# Patient Record
Sex: Male | Born: 1950 | Race: White | Hispanic: No | Marital: Married
Health system: Northeastern US, Academic
[De-identification: ages and names within clinical notes are randomized; demographics above are authoritative.]

## PROBLEM LIST (undated history)

## (undated) DIAGNOSIS — I10 Essential (primary) hypertension: Secondary | ICD-10-CM

## (undated) DIAGNOSIS — E039 Hypothyroidism, unspecified: Secondary | ICD-10-CM

## (undated) DIAGNOSIS — R1310 Dysphagia, unspecified: Secondary | ICD-10-CM

## (undated) HISTORY — DX: Essential (primary) hypertension: I10

## (undated) HISTORY — PX: UPPER GI ENDOSCOPY: SHX6162

---

## 2002-02-16 HISTORY — PX: COLONOSCOPY: SHX174

## 2012-12-13 LAB — LAB REPORT - SCANNED: PSA: 0.9 ng/mL

## 2012-12-15 ENCOUNTER — Encounter: Payer: Self-pay | Admitting: *Deleted

## 2013-01-09 ENCOUNTER — Ambulatory Visit (INDEPENDENT_AMBULATORY_CARE_PROVIDER_SITE_OTHER): Payer: BC Managed Care – PPO | Admitting: General Surgery

## 2013-01-09 ENCOUNTER — Encounter: Payer: Self-pay | Admitting: General Surgery

## 2013-01-09 VITALS — BP 120/78 | HR 76 | Resp 12 | Ht 65.5 in | Wt 179.0 lb

## 2013-01-09 DIAGNOSIS — K429 Umbilical hernia without obstruction or gangrene: Secondary | ICD-10-CM | POA: Insufficient documentation

## 2013-01-09 DIAGNOSIS — K4090 Unilateral inguinal hernia, without obstruction or gangrene, not specified as recurrent: Secondary | ICD-10-CM

## 2013-01-09 NOTE — Progress Notes (Signed)
Patient ID: Frank Williamson, male   DOB: 1950/03/31, 62 y.o.   MRN: [id number]  Chief Complaint  Patient presents with  . Other    New Patient evaluation for hernia    HPI Frank Williamson is a 62 y.o. male who presents for an evaluation of a hernia. The patient was referred by Dr. Vonita Moss. The patient noticed the hernia approximately in March 2014. He has some soreness and some discomfort in the right inguinal area. He states the soreness and discomfort has gradually gotten worse. Bending over, lifting, as well as coughing aggravates this area. He feels like it has gotten larger in size. He does a lot of lifting at his job. He states he has an umbilical hernia that has been present since birth. The patient is scheduled for a colonoscopy next week. He has had 1 prior colonoscopy that was normal.   HPI  Past Medical History  Diagnosis Date  . Hypertension   . Hernia 2014  . Thyroid disease     hypothyroid    Past Surgical History  Procedure Laterality Date  . Colonoscopy  2004    History reviewed. No pertinent family history.  Social History History  Substance Use Topics  . Smoking status: Never Smoker   . Smokeless tobacco: Not on file  . Alcohol Use: Yes    No Known Allergies  Current Outpatient Prescriptions  Medication Sig Dispense Refill  . amLODipine (NORVASC) 5 MG tablet Take 5 mg by mouth daily.      Marland Kitchen aspirin 81 MG tablet Take 81 mg by mouth daily.      . benazepril (LOTENSIN) 40 MG tablet Take 40 mg by mouth daily.      Marland Kitchen levothyroxine (SYNTHROID, LEVOTHROID) 150 MCG tablet Take 150 mcg by mouth daily before breakfast.      . Multiple Vitamin (MULTIVITAMIN) tablet Take 1 tablet by mouth daily.      . Saw Palmetto 450 MG CAPS Take 2 capsules by mouth daily.       No current facility-administered medications for this visit.    Review of Systems Review of Systems  Constitutional: Negative.   Respiratory: Negative.   Cardiovascular: Negative.      Blood pressure 120/78, pulse 76, resp. rate 12, height 5' 5.5" (1.664 m), weight 179 lb (81.194 kg).  Physical Exam Physical Exam  Constitutional: He is oriented to person, place, and time. He appears well-developed and well-nourished.  Eyes: Conjunctivae are normal. No scleral icterus.  Neck: Neck supple. No thyromegaly present.  Cardiovascular: Normal rate, regular rhythm and normal heart sounds.   Pulmonary/Chest: Effort normal and breath sounds normal.  Abdominal: Soft. Bowel sounds are normal. There is tenderness in the right lower quadrant. A hernia is present. Hernia confirmed positive in the right inguinal area.  Umbilical hernia is present.  1 cm fascial defect.   Right inguinal hernia. Small-medium size. Reducible.    Lymphadenopathy:    He has no cervical adenopathy.  Neurological: He is alert and oriented to person, place, and time.  Skin: Skin is warm and dry.    Data Reviewed  Dr. Christell Faith notes.   Assessment    Right inguinal hernia and umbilical hernia present.     Plan    Patient to to be scheduled for right inguinal and umbilical hernia repair. Feel this can be done via laparoscope. Procedure, risks and benfits explained to him and he is agreeable.    This patient's surgery has been scheduled for  01-26-13 at [facility]. It is okay for patient to continue 81 mg aspirin.   SANKAR,SEEPLAPUTHUR G 01/09/2013, 3:45 PM

## 2013-01-09 NOTE — Patient Instructions (Addendum)
Patient to be scheduled for umbilical and right inguinal hernia repair.   Hernia Repair with Laparoscope A hernia occurs when an internal organ pushes out through a weak spot in the belly (abdominal) wall muscles. Hernias most commonly occur in the groin and around the navel. Hernias can also occur through a cut by the surgeon (incision) after an abdominal operation. A hernia may be caused by:  Lifting heavy objects.  Prolonged coughing.  Straining to move your bowels. Hernias can often be pushed back into place (reduced). Most hernias tend to get worse over time. Problems occur when abdominal contents get stuck in the opening and the blood supply is blocked or impaired (incarcerated hernia). Because of these risks, you require surgery to repair the hernia. Your hernia will be repaired using a laparoscope. Laparoscopic surgery is a type of minimally invasive surgery. It does not involve making a typical surgical cut (incision) in the skin. A laparoscope is a telescope-like rod and lens system. It is usually connected to a video camera and a light source so your caregiver can clearly see the operative area. The instruments are inserted through  to  inch (5 mm or 10 mm) openings in the skin at specific locations. A working and viewing space is created by blowing a small amount of carbon dioxide gas into the abdominal cavity. The abdomen is essentially blown up like a balloon (insufflated). This elevates the abdominal wall above the internal organs like a dome. The carbon dioxide gas is common to the human body and can be absorbed by tissue and removed by the respiratory system. Once the repair is completed, the small incisions will be closed with either stitches (sutures) or staples (just like a paper stapler only this staple holds the skin together). LET YOUR CAREGIVERS KNOW ABOUT:  Allergies.  Medications taken including herbs, eye drops, over the counter medications, and creams.  Use of steroids  (by mouth or creams).  Previous problems with anesthetics or Novocaine.  Possibility of pregnancy, if this applies.  History of blood clots (thrombophlebitis).  History of bleeding or blood problems.  Previous surgery.  Other health problems. BEFORE THE PROCEDURE  Laparoscopy can be done either in a hospital or out-patient clinic. You may be given a mild sedative to help you relax before the procedure. Once in the operating room, you will be given a general anesthesia to make you sleep (unless you and your caregiver choose a different anesthetic).  AFTER THE PROCEDURE  After the procedure you will be watched in a recovery area. Depending on what type of hernia was repaired, you might be admitted to the hospital or you might go home the same day. With this procedure you may have less pain and scarring. This usually results in a quicker recovery and less risk of infection. HOME CARE INSTRUCTIONS   Bed rest is not required. You may continue your normal activities but avoid heavy lifting (more than 10 pounds) or straining.  Cough gently. If you are a smoker it is best to stop, as even the best hernia repair can break down with the continual strain of coughing.  Avoid driving until given the OK by your surgeon.  There are no dietary restrictions unless given otherwise.  TAKE ALL MEDICATIONS AS DIRECTED.  Only take over-the-counter or prescription medicines for pain, discomfort, or fever as directed by your caregiver. SEEK MEDICAL CARE IF:   There is increasing abdominal pain or pain in your incisions.  There is more bleeding from incisions,  other than minimal spotting.  You feel light headed or faint.  You develop an unexplained fever, chills, and/or an oral temperature above 102 F (38.9 C).  You have redness, swelling, or increasing pain in the wound.  Pus coming from wound.  A foul smell coming from the wound or dressings. SEEK IMMEDIATE MEDICAL CARE IF:   You develop a  rash.  You have difficulty breathing.  You have any allergic problems. MAKE SURE YOU:   Understand these instructions.  Will watch your condition.  Will get help right away if you are not doing well or get worse. Document Released: 02/02/2005 Document Revised: 04/27/2011 Document Reviewed: 01/02/2009 [facility] Patient Information 2014 [place], [place].  Patient's surgery has been scheduled for 01-26-13 at [facility]. It is okay for patient to continue 81 mg aspirin.

## 2013-01-16 ENCOUNTER — Other Ambulatory Visit: Payer: Self-pay | Admitting: General Surgery

## 2013-01-16 DIAGNOSIS — K4090 Unilateral inguinal hernia, without obstruction or gangrene, not specified as recurrent: Secondary | ICD-10-CM

## 2013-01-16 DIAGNOSIS — K429 Umbilical hernia without obstruction or gangrene: Secondary | ICD-10-CM

## 2013-01-25 ENCOUNTER — Ambulatory Visit: Payer: Self-pay | Admitting: General Surgery

## 2013-01-25 LAB — CBC WITH DIFFERENTIAL/PLATELET
Basophil #: 0.1 10*3/uL (ref 0.0–0.1)
Eosinophil #: 0.2 10*3/uL (ref 0.0–0.7)
HCT: 44 % (ref 40.0–52.0)
Lymphocyte #: 2.2 10*3/uL (ref 1.0–3.6)
Lymphocyte %: 27.9 %
MCHC: 33.3 g/dL (ref 32.0–36.0)
MCV: 91 fL (ref 80–100)
Monocyte #: 0.7 x10 3/mm (ref 0.2–1.0)
Monocyte %: 8.5 %
Platelet: 222 10*3/uL (ref 150–440)
RBC: 4.86 10*6/uL (ref 4.40–5.90)

## 2013-01-25 LAB — BASIC METABOLIC PANEL
BUN: 16 mg/dL (ref 7–18)
Calcium, Total: 9.5 mg/dL (ref 8.5–10.1)
Chloride: 104 mmol/L (ref 98–107)
EGFR (African American): >60
EGFR (Non-African Amer.): >60
Glucose: 91 mg/dL (ref 65–99)
Osmolality: 275 (ref 275–301)
Potassium: 4 mmol/L (ref 3.5–5.1)

## 2013-01-25 LAB — LAB REPORT - SCANNED
CO2: 27 mmol/L — AB (ref 13–22)
Calcium: 9.5 mg/dL (ref 8.7–10.7)
Chloride: 104 mmol/L (ref 99–108)
Creatinine: 0.9 mg/dL (ref 0.6–1.3)
Hgb: 14.7
MCH: 30.2 pg (ref 26.0–34.0)
MCHC: 33 g/dL (ref 30–37)
Platelets: 222
Potassium: 4 mmol/L (ref 3.4–5.3)
RBC: 4.86 10*6/uL (ref 4.50–5.90)
RDW: 12.2 % (ref 11.5–14.5)
Sodium: 137 mmol/L (ref 137–147)
WBC: 7.7 10^3/mL

## 2013-01-26 ENCOUNTER — Encounter: Payer: Self-pay | Admitting: General Surgery

## 2013-01-26 ENCOUNTER — Ambulatory Visit: Payer: Self-pay | Admitting: General Surgery

## 2013-01-26 DIAGNOSIS — K429 Umbilical hernia without obstruction or gangrene: Secondary | ICD-10-CM

## 2013-01-26 DIAGNOSIS — K4090 Unilateral inguinal hernia, without obstruction or gangrene, not specified as recurrent: Secondary | ICD-10-CM

## 2013-01-26 HISTORY — PX: HERNIA REPAIR: SHX51

## 2013-02-02 ENCOUNTER — Encounter: Payer: Self-pay | Admitting: General Surgery

## 2013-02-06 ENCOUNTER — Ambulatory Visit (INDEPENDENT_AMBULATORY_CARE_PROVIDER_SITE_OTHER): Payer: BC Managed Care – PPO | Admitting: General Surgery

## 2013-02-06 ENCOUNTER — Encounter: Payer: Self-pay | Admitting: General Surgery

## 2013-02-06 VITALS — BP 127/78 | HR 76 | Resp 14 | Ht 65.0 in | Wt 177.0 lb

## 2013-02-06 DIAGNOSIS — K4090 Unilateral inguinal hernia, without obstruction or gangrene, not specified as recurrent: Secondary | ICD-10-CM

## 2013-02-06 NOTE — Patient Instructions (Addendum)
Patient to return in four weeks. After another 5 days he can start increasing activity as tolerated.

## 2013-02-06 NOTE — Progress Notes (Signed)
This is a 62 year old male here today for lap right inguinal hernia and umbilical hernia done 01/26/13. Patient states he is doing well.  Abdomen is soft, port sites clean. Repairs intact.

## 2013-02-07 ENCOUNTER — Telehealth: Payer: Self-pay | Admitting: *Deleted

## 2013-02-07 ENCOUNTER — Encounter: Payer: Self-pay | Admitting: General Surgery

## 2013-02-07 NOTE — Telephone Encounter (Signed)
FMLA papers

## 2013-03-14 ENCOUNTER — Ambulatory Visit (INDEPENDENT_AMBULATORY_CARE_PROVIDER_SITE_OTHER): Payer: BC Managed Care – PPO | Admitting: General Surgery

## 2013-03-14 ENCOUNTER — Encounter: Payer: Self-pay | Admitting: General Surgery

## 2013-03-14 VITALS — BP 120/78 | HR 64 | Resp 12 | Ht 65.5 in | Wt 180.0 lb

## 2013-03-14 DIAGNOSIS — K429 Umbilical hernia without obstruction or gangrene: Secondary | ICD-10-CM

## 2013-03-14 DIAGNOSIS — K4090 Unilateral inguinal hernia, without obstruction or gangrene, not specified as recurrent: Secondary | ICD-10-CM

## 2013-03-14 NOTE — Progress Notes (Signed)
Patient ID: Frank Williamson, male   DOB: 29-Mar-1950, 63 y.o.   MRN: [id number]  The patient presents for a post op lap/right inguinal hernia repair as well as an umbilical hernia repair. The procedure was performed on 01/26/13. The patient denies any problems at this time.   The port sites are well healed. No defect noted.  Abdomen is soft. nontender.

## 2013-03-14 NOTE — Patient Instructions (Signed)
Patient to return as needed. Patient advised to contact our office with any new questions or concerns.  

## 2014-06-08 NOTE — Op Note (Signed)
PATIENT NAME:  Frank Williamson, Frank Williamson MR#:  [id number] DATE OF BIRTH:  Dec 02, 1950  DATE OF PROCEDURE:  01/26/2013  PREOPERATIVE DIAGNOSES: 1.  Right inguinal hernia.  2.  Umbilical hernia.   OPERATION: Laparoscopy and repair of right inguinal hernia and umbilical hernia.   SURGEON: Mckinley Jewel, M.D.   ANESTHESIA: General.   COMPLICATIONS: None.   ESTIMATED BLOOD LOSS: Less than 25 mL   DRAINS: None.   DESCRIPTION OF PROCEDURE: The patient was put to sleep in the supine position on the operating table. Foley catheter was inserted and this was removed at the end of the procedure. Abdomen was prepped and draped out as a sterile field. Timeout procedure was performed. The patient had a 1 to 2 cm sized hernial protrusion of the umbilicus and a small incision overlying the upper lip was made. The hernial protrusion was dissected down, freed and easily pushed back in since it contained only preperitoneal tissue. The fascial opening was relatively small and slightly less than a centimeter in size. The Veress needle was introduced into the peritoneal cavity and pneumoperitoneum was obtained and following this, an 11 mm XL port was placed. The camera was introduced and two lateral 5 mm ports were placed. Evaluation showed no evidence of an inguinal hernia on the left side. The right side had an indirect sac. With careful exposure, the peritoneum overlying the inguinal canal was opened with a medial edge brought down and the lateral ends dissected off. Continued dissection was then carried out to free up the sac completely and also to retract back a lipoma of the cord. The posterior wall was then satisfactorily exposed, showing the pubic symphysis. The cord structures and the Cooper's ligament, and a medium-sized 3-D Max mesh was then brought up to the field and placed inside the peritoneal cavity and this was positioned across the posterior wall of the right inguinal canal. The medial tagged area as placed over  the pubic tubercle. Secure strap was used to tack it to the pubic tubercle and a couple along the medial edge and superior edge and one lateral to the cord. Proper positioning was noted. The peritoneum was then reapproximated with secure strap to cover the graft completely. Following this, with the 5 mm scope in the lateral port, a Ventralex 4.3 cm patch was brought up. It was introduced into the peritoneal cavity and pulled up of the anterior sleeves. It was snug against the fascial opening at the umbilicus. The fascial edges of the umbilical opening were approximated with 0 Prolene stitches incorporating the anterior leaves and the excess of the leaves were cut off. A skin incision was closed with subcuticular 4-0 Vicryl, covered with Dermabond. The procedure was well tolerated. He was subsequently extubated and returned to the recovery room in stable condition    ____________________________ S.Robinette Haines, MD sgs:cc D: 01/26/2013 17:17:32 ET T: 01/26/2013 19:30:53 ET JOB#: [id number]  cc: Synthia Innocent. Jamal Collin, MD, <Dictator> [facility] Robinette Haines MD ELECTRONICALLY SIGNED 01/27/2013 12:09

## 2014-07-24 ENCOUNTER — Telehealth: Payer: Self-pay

## 2014-07-24 NOTE — Telephone Encounter (Signed)
Pharmacy sent a fax, they need a copy that is clear, that they can read.  I believe that they are needing his amlodipine and benazepril

## 2014-07-26 MED ORDER — AMLODIPINE BESYLATE 5 MG PO TABS: 5.0000 mg | ORAL_TABLET | Freq: Every day | ORAL | Status: DC

## 2014-07-26 MED ORDER — BENAZEPRIL HCL 40 MG PO TABS: 40.0000 mg | ORAL_TABLET | Freq: Every day | ORAL | Status: DC

## 2014-07-26 NOTE — Telephone Encounter (Signed)
Medications eprescribed back over.

## 2014-08-13 ENCOUNTER — Telehealth: Payer: Self-pay | Admitting: Family Medicine

## 2014-08-13 MED ORDER — SILDENAFIL CITRATE 20 MG PO TABS: 20.0000 mg | ORAL_TABLET | Freq: Three times a day (TID) | ORAL | Status: DC

## 2014-08-13 MED ORDER — LEVOTHYROXINE SODIUM 150 MCG PO TABS: 150.0000 ug | ORAL_TABLET | Freq: Every day | ORAL | Status: DC

## 2014-08-13 NOTE — Telephone Encounter (Signed)
Pt came in say he needs refills sent to [facility] The 2 blood pressure meds were sent but they were missing the 2 others. Pt would like a call back at [phone]

## 2014-08-13 NOTE — Telephone Encounter (Signed)
Rx sent to his pharmacy. OK To let him know.

## 2014-08-14 NOTE — Telephone Encounter (Signed)
Moved from TR's box as she is not here.

## 2014-10-16 ENCOUNTER — Ambulatory Visit (INDEPENDENT_AMBULATORY_CARE_PROVIDER_SITE_OTHER): Payer: BLUE CROSS/BLUE SHIELD | Admitting: Family Medicine

## 2014-10-16 ENCOUNTER — Encounter: Payer: Self-pay | Admitting: Family Medicine

## 2014-10-16 VITALS — BP 128/70 | HR 87 | Temp 97.3°F | Ht 66.1 in | Wt 178.0 lb

## 2014-10-16 DIAGNOSIS — E039 Hypothyroidism, unspecified: Secondary | ICD-10-CM | POA: Diagnosis not present

## 2014-10-16 DIAGNOSIS — I10 Essential (primary) hypertension: Secondary | ICD-10-CM | POA: Insufficient documentation

## 2014-10-16 DIAGNOSIS — N400 Enlarged prostate without lower urinary tract symptoms: Secondary | ICD-10-CM

## 2014-10-16 DIAGNOSIS — R1030 Lower abdominal pain, unspecified: Secondary | ICD-10-CM | POA: Diagnosis not present

## 2014-10-16 LAB — UA/M W/RFLX CULTURE, ROUTINE
Bilirubin, UA: NEGATIVE
GLUCOSE, UA: NEGATIVE
Ketones, UA: NEGATIVE
Leukocytes, UA: NEGATIVE
Nitrite, UA: NEGATIVE
Protein, UA: NEGATIVE
RBC, UA: NEGATIVE
Specific Gravity, UA: 1.02 (ref 1.005–1.030)
Urobilinogen, Ur: 0.2 mg/dL (ref 0.2–1.0)
pH, UA: 8.5 — ABNORMAL HIGH (ref 5.0–7.5)

## 2014-10-16 MED ORDER — TAMSULOSIN HCL 0.4 MG PO CAPS: 0.4000 mg | ORAL_CAPSULE | Freq: Every day | ORAL | Status: DC

## 2014-10-16 NOTE — Patient Instructions (Signed)
Benign Prostatic Hypertrophy The prostate gland is part of the reproductive system of men. A normal prostate is about the size and shape of a walnut. The prostate gland produces a fluid that is mixed with sperm to make semen. This gland surrounds the urethra and is located in front of the rectum and just below the bladder. The bladder is where urine is stored. The urethra is the tube through which urine passes from the bladder to get out of the body. The prostate grows as a man ages. An enlarged prostate not caused by cancer is called benign prostatic hypertrophy (BPH). An enlarged prostate can press on the urethra. This can make it harder to pass urine. In the early stages of enlargement, the bladder can get by with a narrowed urethra by forcing the urine through. If the problem gets worse, medical or surgical treatment may be required.  This condition should be followed by your health care provider. The accumulation of urine in the bladder can cause infection. Back pressure and infection can progress to bladder damage and kidney (renal) failure. If needed, your health care provider may refer you to a specialist in kidney and prostate disease (urologist). CAUSES  BPH is a common health problem in men older than 50 years. This condition is a normal part of aging. However, not all men will develop problems from this condition. If the enlargement grows away from the urethra, then there will not be any compression of the urethra and resistance to urine flow.If the growth is toward the urethra and compresses it, you will experience difficulty urinating.  SYMPTOMS   Not able to completely empty your bladder.  Getting up often during the night to urinate.  Need to urinate frequently during the day.  Difficultly starting urine flow.  Decrease in size and strength of your urine stream.  Dribbling after urination.  Pain on urination (more common with infection).  Inability to pass urine. This needs  immediate treatment.  The development of a urinary tract infection. DIAGNOSIS  These tests will help your health care provider understand your problem:  A thorough history and physical examination.  A urination history, with the number of times you urinate, the amounts of urine, the strength of the urine stream, and the feeling of emptiness or fullness after urinating.  A postvoid bladder scan that measures any amount of urine that may remain in your bladder after you finish urinating.  Digital rectal exam. In a rectal exam, your health care provider checks your prostate by putting a gloved, lubricated finger into your rectum to feel the back of your prostate gland. This exam detects the size of your gland and abnormal lumps or growths.  Exam of your urine (urinalysis).  Prostate specific antigen (PSA) screening. This is a blood test used to screen for prostate cancer.  Rectal ultrasonography. This test uses sound waves to electronically produce a picture of your prostate gland. TREATMENT  Once symptoms begin, your health care provider will monitor your condition. Of the men with this condition, one third will have symptoms that stabilize, one third will have symptoms that improve, and one third will have symptoms that progress in the first year. Mild symptoms may not need treatment. Simple observation and yearly exams may be all that is required. Medicines and surgery are options for more severe problems. Your health care provider can help you make an informed decision for what is best. Two classes of medicines are available for relief of prostate symptoms:  Medicines  that shrink the prostate. This helps relieve symptoms. These medicines take time to work, and it may be months before any improvement is seen.  Uncommon side effects include problems with sexual function.  Medicines to relax the muscle of the prostate. This also relieves the obstruction by reducing any compression on the  urethra.This group of medicines work much faster than those that reduce the size of the prostate gland. Usually, one can experience improvement in days to weeks..  Side effects can include dizziness, fatigue, lightheadedness, and retrograde ejaculation (diminished volume of ejaculate). Several types of surgical treatments are available for relief of prostate symptoms:  Transurethral resection of the prostate (TURP)--In this treatment, an instrument is inserted through opening at the tip of the penis. It is used to cut away pieces of the inner core of the prostate. The pieces are removed through the same opening of the penis. This removes the obstruction and helps get rid of the symptoms.  Transurethral incision (TUIP)--In this procedure, small cuts are made in the prostate. This lessens the prostates pressure on the urethra.  Transurethral microwave thermotherapy (TUMT)--This procedure uses microwaves to create heat. The heat destroys and removes a small amount of prostate tissue.  Transurethral needle ablation (TUNA)--This is a procedure that uses radio frequencies to do the same as TUMT.  Interstitial laser coagulation (ILC)--This is a procedure that uses a laser to do the same as TUMT and TUNA.  Transurethral electrovaporization (TUVP)--This is a procedure that uses electrodes to do the same as the procedures listed above. SEEK MEDICAL CARE IF:   You develop a fever.  There is unexplained back pain.  Symptoms are not helped by medicines prescribed.  You develop side effects from the medicine you are taking.  Your urine becomes very dark or has a bad smell.  Your lower abdomen becomes distended and you have difficulty passing your urine. SEEK IMMEDIATE MEDICAL CARE IF:   You are suddenly unable to urinate. This is an emergency. You should be seen immediately.  There are large amounts of blood or clots in the urine.  Your urinary problems become unmanageable.  You develop  lightheadedness, severe dizziness, or you feel faint.  You develop moderate to severe low back or flank pain.  You develop chills or fever. Document Released: 02/02/2005 Document Revised: 02/07/2013 Document Reviewed: 08/18/2012 [facility] Patient Information 2015 [place], [place]. This information is not intended to replace advice given to you by your health care provider. Make sure you discuss any questions you have with your health care provider. Tamsulosin capsules What is this medicine? TAMSULOSIN (tam SOO loe sin) is used to treat enlargement of the prostate gland in men, a condition called benign prostatic hyperplasia or BPH. It is not for use in women. It works by relaxing muscles in the prostate and bladder neck. This improves urine flow and reduces BPH symptoms. This medicine may be used for other purposes; ask your health care provider or pharmacist if you have questions. COMMON BRAND NAME(S): Flomax What should I tell my health care provider before I take this medicine? They need to know if you have any of the following conditions: -advanced kidney disease -advanced liver disease -low blood pressure -prostate cancer -an unusual or allergic reaction to tamsulosin, sulfa drugs, other medicines, foods, dyes, or preservatives -pregnant or trying to get pregnant -breast-feeding How should I use this medicine? Take this medicine by mouth about 30 minutes after the same meal every day. Follow the directions on the prescription label. Swallow  the capsules whole with a glass of water. Do not crush, chew, or open capsules. Do not take your medicine more often than directed. Do not stop taking your medicine unless your doctor tells you to. Talk to your pediatrician regarding the use of this medicine in children. Special care may be needed. Overdosage: If you think you have taken too much of this medicine contact a poison control center or emergency room at once. NOTE: This medicine is only for  you. Do not share this medicine with others. What if I miss a dose? If you miss a dose, take it as soon as you can. If it is almost time for your next dose, take only that dose. Do not take double or extra doses. If you stop taking your medicine for several days or more, ask your doctor or health care professional what dose you should start back on. What may interact with this medicine? -cimetidine -fluoxetine -ketoconazole -medicines for erectile disfunction like sildenafil, tadalafil, vardenafil -medicines for high blood pressure -other alpha-blockers like alfuzosin, doxazosin, phentolamine, phenoxybenzamine, prazosin, terazosin -warfarin This list may not describe all possible interactions. Give your health care provider a list of all the medicines, herbs, non-prescription drugs, or dietary supplements you use. Also tell them if you smoke, drink alcohol, or use illegal drugs. Some items may interact with your medicine. What should I watch for while using this medicine? Visit your doctor or health care professional for regular check ups. You will need lab work done before you start this medicine and regularly while you are taking it. Check your blood pressure as directed. Ask your health care professional what your blood pressure should be, and when you should contact him or her. This medicine may make you feel dizzy or lightheaded. This is more likely to happen after the first dose, after an increase in dose, or during hot weather or exercise. Drinking alcohol and taking some medicines can make this worse. Do not drive, use machinery, or do anything that needs mental alertness until you know how this medicine affects you. Do not sit or stand up quickly. If you begin to feel dizzy, sit down until you feel better. These effects can decrease once your body adjusts to the medicine. Contact your doctor or health care professional right away if you have an erection that lasts longer than 4 hours or if it  becomes painful. This may be a sign of a serious problem and must be treated right away to prevent permanent damage. If you are thinking of having cataract surgery, tell your eye surgeon that you have taken this medicine. What side effects may I notice from receiving this medicine? Side effects that you should report to your doctor or health care professional as soon as possible: -allergic reactions like skin rash or itching, hives, swelling of the lips, mouth, tongue, or throat -breathing problems -change in vision -feeling faint or lightheaded -irregular heartbeat -prolonged or painful erection -weakness Side effects that usually do not require medical attention (report to your doctor or health care professional if they continue or are bothersome): -back pain -change in sex drive or performance -constipation, nausea or vomiting -cough -drowsy -runny or stuffy nose -trouble sleeping This list may not describe all possible side effects. Call your doctor for medical advice about side effects. You may report side effects to FDA at 1-800-FDA-1088. Where should I keep my medicine? Keep out of the reach of children. Store at room temperature between 15 and 30 degrees C (59  and 86 degrees F). Throw away any unused medicine after the expiration date. NOTE: This sheet is a summary. It may not cover all possible information. If you have questions about this medicine, talk to your doctor, pharmacist, or health care provider.  2015, [employer]. (2012-02-03 14:11:34)

## 2014-10-16 NOTE — Assessment & Plan Note (Signed)
Under good control on recheck, may need to change medicine if starts flomax.

## 2014-10-16 NOTE — Assessment & Plan Note (Signed)
Due for recheck in November.

## 2014-10-16 NOTE — Assessment & Plan Note (Signed)
Strong family history and seems to be describing symptoms of BPH. Will hold on repeating PSA until physical in November. Normal urine today. Will consider flomax. Information given about BPH and flomax. Unsure if he wants to start it. If he does, will return for follow up and BP check in 1 month. If not, will reevaluate at his physical in November.

## 2014-10-16 NOTE — Progress Notes (Signed)
BP 128/70 mmHg  Pulse 87  Temp(Src) 97.3 F (36.3 C)  Ht 5' 6.1" (1.679 m)  Wt 178 lb (80.74 kg)  BMI 28.64 kg/m2  SpO2 99%   Subjective:    Patient ID: Frank Williamson, male    DOB: 05-Sep-1950, 64 y.o.   MRN: [id number]  HPI: Frank Williamson is a 64 y.o. male  Chief Complaint  Patient presents with  . Abdominal Pain    roght groin area pain, burning. Frequent urination. patient states that it gets tight and feels as if it is a knot when he has a full bladder, after emptying it goes away   URINARY SYMPTOMS Duration: x 6 months has had increased frequency and 2-3 weeks, has been having pressure on the R side Dysuria: no Urinary frequency: yes Urgency: yes Small volume voids: yes Symptom severity: moderate Nocturia: no Incomplete voiding: yes Weak urinary stream: yes Straining to start stream: yes Urinary incontinence: no Foul odor: no Hematuria: no Abdominal pain: yes Back pain: no Suprapubic pain/pressure: yes Flank pain: no Fever:  no Vomiting: no Status: stable Previous urinary tract infection: no Recurrent urinary tract infection: no Sexual activity: monogomous History of sexually transmitted disease: no Penile discharge: no  Relevant past medical, surgical, family and social history reviewed and updated as indicated. Interim medical history since our last visit reviewed. Allergies and medications reviewed and updated.  Review of Systems  Constitutional: Negative.   Cardiovascular: Negative.   Genitourinary: Positive for urgency, frequency and difficulty urinating. Negative for dysuria, hematuria, flank pain, decreased urine volume, discharge, penile swelling, scrotal swelling, enuresis, genital sores, penile pain and testicular pain.  Psychiatric/Behavioral: Negative.     Per HPI unless specifically indicated above     Objective:    BP 128/70 mmHg  Pulse 87  Temp(Src) 97.3 F (36.3 C)  Ht 5' 6.1" (1.679 m)  Wt 178 lb (80.74 kg)  BMI 28.64  kg/m2  SpO2 99%  Wt Readings from Last 3 Encounters:  10/16/14 178 lb (80.74 kg)  10/16/14 179 lb (81.194 kg)  03/14/13 180 lb (81.647 kg)    Physical Exam  Constitutional: He is oriented to person, place, and time. He appears well-developed and well-nourished. No distress.  HENT:  Head: Normocephalic and atraumatic.  Right Ear: Hearing normal.  Left Ear: Hearing normal.  Nose: Nose normal.  Eyes: Conjunctivae and lids are normal. Right eye exhibits no discharge. Left eye exhibits no discharge. No scleral icterus.  Cardiovascular: Normal rate, regular rhythm, normal heart sounds and intact distal pulses.  Exam reveals no gallop and no friction rub.   No murmur heard. Pulmonary/Chest: Effort normal and breath sounds normal. No respiratory distress. He has no wheezes. He has no rales. He exhibits no tenderness.  Abdominal: Soft. Bowel sounds are normal. He exhibits no distension and no mass. There is no tenderness. There is no rebound and no guarding.  Musculoskeletal: Normal range of motion.  Neurological: He is alert and oriented to person, place, and time.  Skin: Skin is warm, dry and intact. No rash noted. No pallor.  Psychiatric: He has a normal mood and affect. His speech is normal and behavior is normal. Judgment and thought content normal. Cognition and memory are normal.    Results for orders placed or performed in visit on 02/24/13  Lab report - scanned  Result Value Ref Range   PSA 0.9 ng/mL      Assessment & Plan:   Problem List Items Addressed This Visit  Cardiovascular and Mediastinum   Hypertension    Under good control on recheck, may need to change medicine if starts flomax.         Endocrine   Hypothyroid    Due for recheck in November.         Genitourinary   BPH (benign prostatic hyperplasia) - Primary    Strong family history and seems to be describing symptoms of BPH. Will hold on repeating PSA until physical in November. Normal urine today.  Will consider flomax. Information given about BPH and flomax. Unsure if he wants to start it. If he does, will return for follow up and BP check in 1 month. If not, will reevaluate at his physical in November.       Relevant Medications   tamsulosin (FLOMAX) 0.4 MG CAPS capsule    Other Visit Diagnoses    Lower abdominal pain        Seems to be due to BPH.     Relevant Orders    UA/M w/rflx Culture, Routine        Follow up plan: Return in about 1 month (around 11/16/2014), or After starting the flomax.

## 2014-12-04 ENCOUNTER — Encounter: Payer: Self-pay | Admitting: Family Medicine

## 2014-12-04 ENCOUNTER — Ambulatory Visit (INDEPENDENT_AMBULATORY_CARE_PROVIDER_SITE_OTHER): Payer: BLUE CROSS/BLUE SHIELD | Admitting: Family Medicine

## 2014-12-04 VITALS — BP 129/91 | HR 100 | Temp 96.6°F | Ht 65.5 in | Wt 180.0 lb

## 2014-12-04 DIAGNOSIS — J029 Acute pharyngitis, unspecified: Secondary | ICD-10-CM

## 2014-12-04 MED ORDER — AMOXICILLIN-POT CLAVULANATE 875-125 MG PO TABS: 1.0000 | ORAL_TABLET | Freq: Two times a day (BID) | ORAL | Status: DC

## 2014-12-04 MED ORDER — PREDNISONE 10 MG PO TABS: ORAL_TABLET | ORAL | Status: DC

## 2014-12-04 NOTE — Progress Notes (Signed)
BP 129/91 mmHg  Pulse 100  Temp(Src) 96.6 F (35.9 C)  Ht 5' 5.5" (1.664 m)  Wt 180 lb (81.647 kg)  BMI 29.49 kg/m2  SpO2 99%   Subjective:    Patient ID: Frank Williamson, male    DOB: Oct 21, 1950, 64 y.o.   MRN: [id number]  HPI: Frank Williamson is a 64 y.o. male  Chief Complaint  Patient presents with  . URI    X 8 days, patient states that he was working in the yard last Monday, that afternoon he started feeling bad, he assumed that it was allergies. It has not gotten better. He states that he has a cough,sore throat, sinus pressure, nasal congestion, chills/sweats and possible fever.   UPPER RESPIRATORY TRACT INFECTION x 8 days Worst symptom: sore throat Fever: no Cough: yes Shortness of breath: no Wheezing: yes Chest pain: no Chest tightness: yes Chest congestion: yes Nasal congestion: yes Runny nose: no Post nasal drip: yes Sneezing: yes Sore throat: yes Swollen glands: yes Sinus pressure: yes Headache: yes Face pain: no Toothache: no Ear pain: no  Ear pressure: yes bilateral Eyes red/itching:no Eye drainage/crusting: no  Vomiting: no Rash: no Fatigue: yes Sick contacts: yes Strep contacts: no  Context: worse Recurrent sinusitis: no Relief with OTC cold/cough medications: yes  Treatments attempted: pseudoephedrine    Relevant past medical, surgical, family and social history reviewed and updated as indicated. Interim medical history since our last visit reviewed. Allergies and medications reviewed and updated.  Review of Systems  Constitutional: Negative.   HENT: Positive for congestion, postnasal drip, rhinorrhea, sinus pressure, sneezing and sore throat. Negative for dental problem, drooling, ear discharge, ear pain, facial swelling, hearing loss, mouth sores, nosebleeds, tinnitus, trouble swallowing and voice change.   Cardiovascular: Negative.   Psychiatric/Behavioral: Negative.     Per HPI unless specifically indicated above      Objective:    BP 129/91 mmHg  Pulse 100  Temp(Src) 96.6 F (35.9 C)  Ht 5' 5.5" (1.664 m)  Wt 180 lb (81.647 kg)  BMI 29.49 kg/m2  SpO2 99%  Wt Readings from Last 3 Encounters:  12/04/14 180 lb (81.647 kg)  10/16/14 178 lb (80.74 kg)  10/16/14 179 lb (81.194 kg)    Physical Exam  Constitutional: He is oriented to person, place, and time. He appears well-developed and well-nourished. No distress.  HENT:  Head: Normocephalic and atraumatic.  Right Ear: Hearing, tympanic membrane, external ear and ear canal normal.  Left Ear: Hearing, tympanic membrane, external ear and ear canal normal.  Nose: Mucosal edema and rhinorrhea present. No nose lacerations, sinus tenderness, nasal deformity, septal deviation or nasal septal hematoma. No epistaxis.  No foreign bodies. Right sinus exhibits no maxillary sinus tenderness and no frontal sinus tenderness. Left sinus exhibits no maxillary sinus tenderness and no frontal sinus tenderness.  Mouth/Throat: Uvula is midline, oropharynx is clear and moist and mucous membranes are normal. Mucous membranes are not pale, not dry and not cyanotic. He does not have dentures. No oral lesions. No trismus in the jaw. Normal dentition. No dental abscesses, uvula swelling, lacerations or dental caries. No oropharyngeal exudate, posterior oropharyngeal edema, posterior oropharyngeal erythema or tonsillar abscesses.  3+ tonsils  Eyes: Conjunctivae, EOM and lids are normal. Pupils are equal, round, and reactive to light. Right eye exhibits no discharge. Left eye exhibits no discharge. No scleral icterus.  Neck: Normal range of motion. Neck supple. No JVD present. No tracheal deviation present. No thyromegaly present.  Cardiovascular: Normal  rate, regular rhythm, normal heart sounds and intact distal pulses.  Exam reveals no gallop and no friction rub.   No murmur heard. Pulmonary/Chest: Effort normal and breath sounds normal. No stridor. No respiratory distress. He has  no wheezes. He has no rales. He exhibits no tenderness.  Musculoskeletal: Normal range of motion.  Lymphadenopathy:    He has cervical adenopathy.  Neurological: He is alert and oriented to person, place, and time.  Skin: Skin is intact. No rash noted.  Psychiatric: He has a normal mood and affect. His speech is normal and behavior is normal. Judgment and thought content normal. Cognition and memory are normal.  Nursing note and vitals reviewed.   Results for orders placed or performed in visit on 10/16/14  UA/M w/rflx Culture, Routine  Result Value Ref Range   Specific Gravity, UA 1.020 1.005 - 1.030   pH, UA 8.5 (H) 5.0 - 7.5   Color, UA Yellow Yellow   Appearance Ur Clear Clear   Leukocytes, UA Negative Negative   Protein, UA Negative Negative/Trace   Glucose, UA Negative Negative   Ketones, UA Negative Negative   RBC, UA Negative Negative   Bilirubin, UA Negative Negative   Urobilinogen, Ur 0.2 0.2 - 1.0 mg/dL   Nitrite, UA Negative Negative      Assessment & Plan:   Problem List Items Addressed This Visit    None    Visit Diagnoses    Acute pharyngitis, unspecified etiology    -  Primary    Strep negative. Will treat with prednisone for congestion and swelling, and will give rx for augmentin in case he gets worse or not better.    Relevant Orders    Strep Gp A Ag, IA W/Reflex        Follow up plan: Return if symptoms worsen or fail to improve.

## 2014-12-04 NOTE — Patient Instructions (Signed)

## 2014-12-09 LAB — STREP GP A AG, IA W/REFLEX

## 2014-12-10 ENCOUNTER — Telehealth: Payer: Self-pay | Admitting: Family Medicine

## 2014-12-10 NOTE — Telephone Encounter (Signed)
Called to let patient know he tested positive for strep on culture. Had already started antibiotics. Will continue them and finish them.

## 2014-12-24 ENCOUNTER — Encounter: Payer: Self-pay | Admitting: Family Medicine

## 2014-12-24 ENCOUNTER — Ambulatory Visit (INDEPENDENT_AMBULATORY_CARE_PROVIDER_SITE_OTHER): Payer: BLUE CROSS/BLUE SHIELD | Admitting: Family Medicine

## 2014-12-24 VITALS — BP 123/82 | HR 76 | Temp 98.7°F | Ht 65.5 in | Wt 180.0 lb

## 2014-12-24 DIAGNOSIS — I10 Essential (primary) hypertension: Secondary | ICD-10-CM

## 2014-12-24 DIAGNOSIS — E039 Hypothyroidism, unspecified: Secondary | ICD-10-CM

## 2014-12-24 DIAGNOSIS — Z0000 Encounter for general adult medical examination without abnormal findings: Secondary | ICD-10-CM

## 2014-12-24 LAB — URINALYSIS, ROUTINE W REFLEX MICROSCOPIC
Bilirubin, UA: NEGATIVE
Glucose, UA: NEGATIVE
Ketones, UA: NEGATIVE
Leukocytes, UA: NEGATIVE
NITRITE UA: NEGATIVE
PH UA: 5.5 (ref 5.0–7.5)
Protein, UA: NEGATIVE
Specific Gravity, UA: 1.01 (ref 1.005–1.030)
UUROB: 0.2 mg/dL (ref 0.2–1.0)

## 2014-12-24 LAB — MICROSCOPIC EXAMINATION: WBC UA: NONE SEEN /HPF (ref 0–?)

## 2014-12-24 MED ORDER — BENAZEPRIL HCL 40 MG PO TABS: 40.0000 mg | ORAL_TABLET | Freq: Every day | ORAL | Status: DC

## 2014-12-24 MED ORDER — AMLODIPINE BESYLATE 5 MG PO TABS: 5.0000 mg | ORAL_TABLET | Freq: Every day | ORAL | Status: DC

## 2014-12-24 MED ORDER — LEVOTHYROXINE SODIUM 150 MCG PO TABS: 150.0000 ug | ORAL_TABLET | Freq: Every day | ORAL | Status: DC

## 2014-12-24 MED ORDER — SILDENAFIL CITRATE 20 MG PO TABS: 20.0000 mg | ORAL_TABLET | ORAL | Status: DC

## 2014-12-24 NOTE — Assessment & Plan Note (Signed)
The current medical regimen is effective;  continue present plan and medications.  

## 2014-12-24 NOTE — Progress Notes (Signed)
BP 123/82 mmHg  Pulse 76  Temp(Src) 98.7 F (37.1 C)  Ht 5' 5.5" (1.664 m)  Wt 180 lb (81.647 kg)  BMI 29.49 kg/m2  SpO2 98%   Subjective:    Patient ID: Frank Williamson, male    DOB: Mar 17, 1950, 64 y.o.   MRN: [id number]  HPI: Frank Williamson is a 64 y.o. male  Chief Complaint  Patient presents with  . Annual Exam   patient for physical had prior to antibiotics for strep throat last month had some abdominal pain that would come on intermittently some fullness that resolved with antibiotics. Patient also with some sensation and tiredness, feeling an area of hernia repair with mesh.   Relevant past medical, surgical, family and social history reviewed and updated as indicated. Interim medical history since our last visit reviewed. Allergies and medications reviewed and updated.  Review of Systems  Constitutional: Negative.   HENT: Negative.   Eyes: Negative.   Respiratory: Negative.   Cardiovascular: Negative.   Gastrointestinal: Negative.   Endocrine: Negative.   Genitourinary: Negative.   Musculoskeletal: Negative.   Skin: Negative.   Allergic/Immunologic: Negative.   Neurological: Negative.   Hematological: Negative.   Psychiatric/Behavioral: Negative.     Per HPI unless specifically indicated above     Objective:    BP 123/82 mmHg  Pulse 76  Temp(Src) 98.7 F (37.1 C)  Ht 5' 5.5" (1.664 m)  Wt 180 lb (81.647 kg)  BMI 29.49 kg/m2  SpO2 98%  Wt Readings from Last 3 Encounters:  12/24/14 180 lb (81.647 kg)  12/04/14 180 lb (81.647 kg)  10/16/14 178 lb (80.74 kg)    Physical Exam  Constitutional: He is oriented to person, place, and time. He appears well-developed and well-nourished.  HENT:  Head: Normocephalic and atraumatic.  Right Ear: External ear normal.  Left Ear: External ear normal.  Eyes: Conjunctivae and EOM are normal. Pupils are equal, round, and reactive to light.  Neck: Normal range of motion. Neck supple.  Cardiovascular: Normal  rate, regular rhythm, normal heart sounds and intact distal pulses.   Pulmonary/Chest: Effort normal and breath sounds normal.  Abdominal: Soft. Bowel sounds are normal. There is no splenomegaly or hepatomegaly.  Genitourinary: Rectum normal, prostate normal and penis normal.  Musculoskeletal: Normal range of motion.  Neurological: He is alert and oriented to person, place, and time. He has normal reflexes.  Skin: No rash noted. No erythema.  Psychiatric: He has a normal mood and affect. His behavior is normal. Judgment and thought content normal.    Results for orders placed or performed in visit on 12/04/14  Strep Gp A Ag, IA W/Reflex  Result Value Ref Range   Strep A Culture Comment (A)       Assessment & Plan:   Problem List Items Addressed This Visit      Cardiovascular and Mediastinum   Hypertension - Primary    The current medical regimen is effective;  continue present plan and medications.       Relevant Medications   amLODipine (NORVASC) 5 MG tablet   benazepril (LOTENSIN) 40 MG tablet   sildenafil (REVATIO) 20 MG tablet     Endocrine   Hypothyroid    The current medical regimen is effective;  continue present plan and medications.       Relevant Medications   levothyroxine (SYNTHROID, LEVOTHROID) 150 MCG tablet    Other Visit Diagnoses    PE (physical exam), annual  Relevant Orders    Comprehensive metabolic panel    Lipid panel    CBC with Differential/Platelet    PSA    Urinalysis, Routine w reflex microscopic (not at [facility])    TSH        Follow up plan: Return in about 6 months (around 06/23/2015), or if symptoms worsen or fail to improve, for 6 mo BMP.

## 2014-12-25 ENCOUNTER — Encounter: Payer: Self-pay | Admitting: Family Medicine

## 2014-12-25 LAB — CBC WITH DIFFERENTIAL/PLATELET
BASOS ABS: 0 10*3/uL (ref 0.0–0.2)
BASOS: 0 %
EOS (ABSOLUTE): 0.2 10*3/uL (ref 0.0–0.4)
Eos: 3 %
Hematocrit: 43.2 % (ref 37.5–51.0)
Hemoglobin: 15.1 g/dL (ref 12.6–17.7)
IMMATURE GRANS (ABS): 0 10*3/uL (ref 0.0–0.1)
Immature Granulocytes: 0 %
LYMPHS: 33 %
Lymphocytes Absolute: 1.7 10*3/uL (ref 0.7–3.1)
MCH: 30.8 pg (ref 26.6–33.0)
MCHC: 35 g/dL (ref 31.5–35.7)
MCV: 88 fL (ref 79–97)
MONOS ABS: 0.6 10*3/uL (ref 0.1–0.9)
Monocytes: 11 %
NEUTROS ABS: 2.8 10*3/uL (ref 1.4–7.0)
NEUTROS PCT: 53 %
PLATELETS: 218 10*3/uL (ref 150–379)
RBC: 4.9 x10E6/uL (ref 4.14–5.80)
RDW: 12.2 % — AB (ref 12.3–15.4)
WBC: 5.2 10*3/uL (ref 3.4–10.8)

## 2014-12-25 LAB — COMPREHENSIVE METABOLIC PANEL
ALBUMIN: 4.5 g/dL (ref 3.6–4.8)
ALT: 37 IU/L (ref 0–44)
AST: 20 IU/L (ref 0–40)
Albumin/Globulin Ratio: 1.9 (ref 1.1–2.5)
Alkaline Phosphatase: 56 IU/L (ref 39–117)
BUN / CREAT RATIO: 12 (ref 10–22)
BUN: 11 mg/dL (ref 8–27)
Bilirubin Total: 0.7 mg/dL (ref 0.0–1.2)
CALCIUM: 9.8 mg/dL (ref 8.6–10.2)
CO2: 22 mmol/L (ref 18–29)
CREATININE: 0.9 mg/dL (ref 0.76–1.27)
Chloride: 103 mmol/L (ref 97–106)
GFR calc Af Amer: 105 mL/min/{1.73_m2} (ref >59)
GFR, EST NON AFRICAN AMERICAN: 91 mL/min/{1.73_m2} (ref >59)
GLOBULIN, TOTAL: 2.4 g/dL (ref 1.5–4.5)
GLUCOSE: 88 mg/dL (ref 65–99)
Potassium: 4.6 mmol/L (ref 3.5–5.2)
SODIUM: 141 mmol/L (ref 136–144)
Total Protein: 6.9 g/dL (ref 6.0–8.5)

## 2014-12-25 LAB — LIPID PANEL
CHOL/HDL RATIO: 5 ratio (ref 0.0–5.0)
CHOLESTEROL TOTAL: 230 mg/dL — AB (ref 100–199)
HDL: 46 mg/dL (ref >39)
LDL CALC: 148 mg/dL — AB (ref 0–99)
TRIGLYCERIDES: 182 mg/dL — AB (ref 0–149)
VLDL CHOLESTEROL CAL: 36 mg/dL (ref 5–40)

## 2014-12-25 LAB — PSA: PROSTATE SPECIFIC AG, SERUM: 1.1 ng/mL (ref 0.0–4.0)

## 2014-12-25 LAB — TSH: TSH: 1.89 u[IU]/mL (ref 0.450–4.500)

## 2015-01-18 ENCOUNTER — Other Ambulatory Visit: Payer: Self-pay | Admitting: Family Medicine

## 2015-04-12 ENCOUNTER — Ambulatory Visit
Admission: EM | Admit: 2015-04-12 | Discharge: 2015-04-12 | Disposition: A | Discharge status: Home / Self Care | Payer: BLUE CROSS/BLUE SHIELD | Attending: Family Medicine | Admitting: Family Medicine

## 2015-04-12 ENCOUNTER — Encounter: Payer: Self-pay | Admitting: Emergency Medicine

## 2015-04-12 DIAGNOSIS — J101 Influenza due to other identified influenza virus with other respiratory manifestations: Secondary | ICD-10-CM

## 2015-04-12 LAB — RAPID INFLUENZA A&B ANTIGENS
Influenza A (ARMC): DETECTED
Influenza B (ARMC): NOT DETECTED

## 2015-04-12 MED ORDER — BENZONATATE 100 MG PO CAPS: 100.0000 mg | ORAL_CAPSULE | Freq: Three times a day (TID) | ORAL | Status: DC | PRN

## 2015-04-12 MED ORDER — OSELTAMIVIR PHOSPHATE 75 MG PO CAPS: 75.0000 mg | ORAL_CAPSULE | Freq: Two times a day (BID) | ORAL | Status: DC

## 2015-04-12 NOTE — Discharge Instructions (Signed)
Take medication as prescribed. Rest. Drink plenty of fluids.   Follow up with your primary care physician this week as needed. Return to Urgent care for new or worsening concerns.    Influenza, Adult Influenza ("the flu") is a viral infection of the respiratory tract. It occurs more often in winter months because people spend more time in close contact with one another. Influenza can make you feel very sick. Influenza easily spreads from person to person (contagious). CAUSES  Influenza is caused by a virus that infects the respiratory tract. You can catch the virus by breathing in droplets from an infected person's cough or sneeze. You can also catch the virus by touching something that was recently contaminated with the virus and then touching your mouth, nose, or eyes. RISKS AND COMPLICATIONS You may be at risk for a more severe case of influenza if you smoke cigarettes, have diabetes, have chronic heart disease (such as heart failure) or lung disease (such as asthma), or if you have a weakened immune system. Elderly people and pregnant women are also at risk for more serious infections. The most common problem of influenza is a lung infection (pneumonia). Sometimes, this problem can require emergency medical care and may be life threatening. SIGNS AND SYMPTOMS  Symptoms typically last 4 to 10 days and may include:  Fever.  Chills.  Headache, body aches, and muscle aches.  Sore throat.  Chest discomfort and cough.  Poor appetite.  Weakness or feeling tired.  Dizziness.  Nausea or vomiting. DIAGNOSIS  Diagnosis of influenza is often made based on your history and a physical exam. A nose or throat swab test can be done to confirm the diagnosis. TREATMENT  In mild cases, influenza goes away on its own. Treatment is directed at relieving symptoms. For more severe cases, your health care provider may prescribe antiviral medicines to shorten the sickness. Antibiotic medicines are not  effective because the infection is caused by a virus, not by bacteria. HOME CARE INSTRUCTIONS  Take medicines only as directed by your health care provider.  Use a cool mist humidifier to make breathing easier.  Get plenty of rest until your temperature returns to normal. This usually takes 3 to 4 days.  Drink enough fluid to keep your urine clear or pale yellow.  Cover yourmouth and nosewhen coughing or sneezing,and wash your handswellto prevent thevirusfrom spreading.  Stay homefromwork orschool untilthe fever is gonefor at least 86full day. PREVENTION  An annual influenza vaccination (flu shot) is the best way to avoid getting influenza. An annual flu shot is now routinely recommended for all adults in the [place] IF:  You experiencechest pain, yourcough worsens,or you producemore mucus.  Youhave nausea,vomiting, ordiarrhea.  Your fever returns or gets worse. SEEK IMMEDIATE MEDICAL CARE IF:  You havetrouble breathing, you become short of breath,or your skin ornails becomebluish.  You have severe painor stiffnessin the neck.  You develop a sudden headache, or pain in the face or ear.  You have nausea or vomiting that you cannot control. MAKE SURE YOU:   Understand these instructions.  Will watch your condition.  Will get help right away if you are not doing well or get worse.   This information is not intended to replace advice given to you by your health care provider. Make sure you discuss any questions you have with your health care provider.   Document Released: 01/31/2000 Document Revised: 02/23/2014 Document Reviewed: 05/04/2011 Elsevier Interactive Patient Education [employer].

## 2015-04-12 NOTE — ED Provider Notes (Signed)
[facility]  ____________________________________________  Time seen: Approximately 12:44 PM  I have reviewed the triage vital signs and the nursing notes.   HISTORY  Chief Complaint Cough; Fever; and Generalized Body Aches   HPI Frank Williamson is a 65 y.o. male  presents for the complaint of runny nose, nasal congestion, body aches, chills, subjective fever present 2.5 days. Reports overall feels more tired than normal. Reports continues to eat and drink well. States did not check his fever but states that he had intermittent warm and cold chills. Patient reports that just prior to symptom onset he was at church and was sitting next to a 63 year old that was tested positive for influenza this past Wednesday. States cough intermittently and states cough is a dry cough. States clear runny nose. Reports has taken intermittent over-the-counter Tylenol and ibuprofen.  Denies chest pain, shortness of breath, wheezing, dizziness, weakness.   PCP:  [facility]   Past Medical History  Diagnosis Date  . Hypertension     Patient Active Problem List   Diagnosis Date Noted  . BPH (benign prostatic hyperplasia) 10/16/2014  . Hypertension   . Hypothyroid     Past Surgical History  Procedure Laterality Date  . Colonoscopy  2004  . Hernia repair  01/26/13    right inguinal hernia and umbilical hernia    Current Outpatient Rx  Name  Route  Sig  Dispense  Refill  . amLODipine (NORVASC) 5 MG tablet   Oral   Take 1 tablet (5 mg total) by mouth daily.   90 tablet   4   . aspirin 81 MG tablet   Oral   Take 81 mg by mouth daily.         . benazepril (LOTENSIN) 40 MG tablet   Oral   Take 1 tablet (40 mg total) by mouth daily.   90 tablet   4   . fluticasone (FLONASE) 50 MCG/ACT nasal spray      USE 2 SPRAYS EACH NOSTRIL DAILY   16 g   0   . levothyroxine (SYNTHROID, LEVOTHROID) 150 MCG tablet   Oral   Take 1 tablet (150 mcg total) by mouth daily before  breakfast.   90 tablet   4   . Multiple Vitamin (MULTIVITAMIN) tablet   Oral   Take 1 tablet by mouth daily.         . Saw Palmetto 450 MG CAPS   Oral   Take 2 capsules by mouth daily.         . sildenafil (REVATIO) 20 MG tablet   Oral   Take 1 tablet (20 mg total) by mouth 1 day or 1 dose. 1-5 prn   90 tablet   4     Allergies Review of patient's allergies indicates no known allergies.  Family History  Problem Relation Age of Onset  . Heart attack Father   . Diabetes Brother   . Dementia Maternal Grandmother   . Cancer Maternal Grandfather     bone  . Dementia Paternal Grandmother     Social History Social History  Substance Use Topics  . Smoking status: Never Smoker   . Smokeless tobacco: Never Used  . Alcohol Use: Yes     Comment: on occasion    Review of Systems Constitutional: Subjective fevers. Eyes: No visual changes. ENT: No sore throat. Positive runny nose, nasal congestion, cough. Cardiovascular: Denies chest pain. Respiratory: Denies shortness of breath. Gastrointestinal: No abdominal pain.  No nausea,  no vomiting.  No diarrhea.  No constipation. Genitourinary: Negative for dysuria. Musculoskeletal: Negative for back pain. Skin: Negative for rash. Neurological: Negative for headaches, focal weakness or numbness.  10-point ROS otherwise negative.  ____________________________________________   PHYSICAL EXAM:  VITAL SIGNS: ED Triage Vitals  Enc Vitals Group     BP 04/12/15 1155 112/74 mmHg     Pulse Rate 04/12/15 1155 90     Resp 04/12/15 1155 16     Temp 04/12/15 1155 98.2 F (36.8 C)     Temp Source 04/12/15 1155 Oral     SpO2 04/12/15 1155 97 %     Weight 04/12/15 1155 174 lb (78.926 kg)     Height 04/12/15 1155 5\' 6"  (1.676 m)     Head Cir --      Peak Flow --      Pain Score 04/12/15 1158 5     Pain Loc --      Pain Edu? --      Excl. in [place]? --     Constitutional: Alert and oriented. Well appearing and in no acute  distress. Eyes: Conjunctivae are normal. PERRL. EOMI. Head: Atraumatic. No sinus tenderness to palpation. No swelling. No erythema.  Ears: no erythema, normal TMs bilaterally.   Nose: Nasal congestion with clear rhinorrhea.  Mouth/Throat: Mucous membranes are moist.  Oropharynx non-erythematous. No tonsillar swelling or exudate. Neck: No stridor.  No cervical spine tenderness to palpation. Hematological/Lymphatic/Immunilogical: No cervical lymphadenopathy. Cardiovascular: Normal rate, regular rhythm. Grossly normal heart sounds.  Good peripheral circulation. Respiratory: Normal respiratory effort.  No retractions. Lungs CTAB. No wheezes, rales or rhonchi. Good air movement. Gastrointestinal: Soft and nontender.  No CVA tenderness. Musculoskeletal: No lower or upper extremity tenderness nor edema.  No calf tenderness bilaterally.  Neurologic:  Normal speech and language. No gross focal neurologic deficits are appreciated. No gait instability. Skin:  Skin is warm, dry and intact. No rash noted. Psychiatric: Mood and affect are normal. Speech and behavior are normal.  ____________________________________________   LABS (all labs ordered are listed, but only abnormal results are displayed)  Labs Reviewed  RAPID INFLUENZA A&B ANTIGENS ([place])    INITIAL IMPRESSION / [place] / ED COURSE  Pertinent labs & imaging results that were available during my care of the patient were reviewed by me and considered in my medical decision making (see chart for details).  Well-appearing patient. No acute distress. Presents for the complaints of 2.5 days of runny nose, nasal congestion, body aches with intermittent subjective fevers. Reports cough is dry cough. Lungs clear throughout. Abdomen soft nontender. Moist membranes. Suspect viral upper respiratory infection such as influenza.  Influenza A positive. Will treat with oral Tamiflu, when necessary Tessalon Perles, encouraged rest,  fluids, Tylenol or ibuprofen as needed and PCP follow up as needed. Work note for today and tomorrow given.  Discussed follow up with Primary care physician this week. Discussed follow up and return parameters including no resolution or any worsening concerns. Patient verbalized understanding and agreed to plan.   ____________________________________________   FINAL CLINICAL IMPRESSION(S) / ED DIAGNOSES  Final diagnoses:  Influenza A      Note: This dictation was prepared with Dragon dictation along with smaller phrase technology. Any transcriptional errors that result from this process are unintentional.    Marylene Land, NP 04/12/15 1345

## 2015-04-12 NOTE — ED Notes (Signed)
Patient c/o runny nose, cough, fever, and bodyaches that started this Tuesday.

## 2015-04-15 ENCOUNTER — Ambulatory Visit: Payer: BLUE CROSS/BLUE SHIELD | Admitting: Family Medicine

## 2015-04-15 ENCOUNTER — Telehealth: Payer: Self-pay

## 2015-04-15 ENCOUNTER — Encounter: Payer: Self-pay | Admitting: Family Medicine

## 2015-04-15 NOTE — Telephone Encounter (Signed)
Rx written for him to pick up

## 2015-04-15 NOTE — Telephone Encounter (Signed)
Called and spoke with patient, he was told to follow up with [place] in 2-3 days if not feeling better, gave him a work note for two days only, he was Flu A positive. Spoke with Dr. Wynetta Emery, she said that it will take 1 week for him to start feeling better, not to return to work until Friday, March 3,2017 and to take otc sudafed for the congestion. Dr.Johnson: Please write note.

## 2015-06-06 ENCOUNTER — Other Ambulatory Visit: Payer: Self-pay | Admitting: Family Medicine

## 2015-06-06 NOTE — Telephone Encounter (Signed)
Your patient 

## 2015-06-25 ENCOUNTER — Encounter: Payer: Self-pay | Admitting: Family Medicine

## 2015-06-25 ENCOUNTER — Ambulatory Visit (INDEPENDENT_AMBULATORY_CARE_PROVIDER_SITE_OTHER): Payer: BLUE CROSS/BLUE SHIELD | Admitting: Family Medicine

## 2015-06-25 VITALS — BP 117/75 | HR 83 | Temp 98.0°F | Ht 65.1 in | Wt 178.0 lb

## 2015-06-25 DIAGNOSIS — I10 Essential (primary) hypertension: Secondary | ICD-10-CM | POA: Diagnosis not present

## 2015-06-25 DIAGNOSIS — J019 Acute sinusitis, unspecified: Secondary | ICD-10-CM

## 2015-06-25 MED ORDER — AZITHROMYCIN 250 MG PO TABS: ORAL_TABLET | ORAL | Status: DC

## 2015-06-25 MED ORDER — CODEINE POLT-CHLORPHEN POLT ER 14.7-2.8 MG/5ML PO SUER: 10.0000 mL | Freq: Two times a day (BID) | ORAL | Status: DC

## 2015-06-25 NOTE — Progress Notes (Signed)
BP 117/75 mmHg  Pulse 83  Temp(Src) 98 F (36.7 C)  Ht 5' 5.1" (1.654 m)  Wt 178 lb (80.74 kg)  BMI 29.51 kg/m2  SpO2 99%   Subjective:    Patient ID: Frank Williamson, male    DOB: [birth year], 65 y.o.   MRN: [id number]  HPI: Frank Williamson is a 65 y.o. male  Chief Complaint  Patient presents with  . Hypertension  . URI    returned from cruise this past Thursday,   Patient recheck blood pressure doing well no complaints from medications Takes blood pressure medicines faithfully without side effects Last week developed marked head cold congestion drainage especially cough is been progressive and worsening. Has tried over-the-counter medications Tylenol and is just felt bad.   Relevant past medical, surgical, family and social history reviewed and updated as indicated. Interim medical history since our last visit reviewed. Allergies and medications reviewed and updated.  Review of Systems  Constitutional: Positive for fever, chills, diaphoresis and fatigue.  HENT: Positive for congestion, rhinorrhea, sinus pressure, sneezing and sore throat.   Respiratory: Positive for cough.   Cardiovascular: Negative.     Per HPI unless specifically indicated above     Objective:    BP 117/75 mmHg  Pulse 83  Temp(Src) 98 F (36.7 C)  Ht 5' 5.1" (1.654 m)  Wt 178 lb (80.74 kg)  BMI 29.51 kg/m2  SpO2 99%  Wt Readings from Last 3 Encounters:  06/25/15 178 lb (80.74 kg)  04/12/15 174 lb (78.926 kg)  12/24/14 180 lb (81.647 kg)    Physical Exam  Constitutional: He is oriented to person, place, and time. He appears well-developed and well-nourished. No distress.  HENT:  Head: Normocephalic and atraumatic.  Right Ear: Hearing and external ear normal.  Left Ear: Hearing and external ear normal.  Nose: Nose normal.  Mouth/Throat: Oropharyngeal exudate present.  Eyes: Conjunctivae and lids are normal. Right eye exhibits no discharge. Left eye exhibits no discharge. No scleral  icterus.  Neck: No thyromegaly present.  Cardiovascular: Normal rate, regular rhythm and normal heart sounds.   Pulmonary/Chest: Effort normal and breath sounds normal. No respiratory distress. He has no wheezes. He has no rales.  Musculoskeletal: Normal range of motion.  Lymphadenopathy:    He has no cervical adenopathy.  Neurological: He is alert and oriented to person, place, and time.  Skin: Skin is intact. No rash noted.  Psychiatric: He has a normal mood and affect. His speech is normal and behavior is normal. Judgment and thought content normal. Cognition and memory are normal.    Results for orders placed or performed during the hospital encounter of 04/12/15  Rapid Influenza A&B Antigens ([facility] only)  Result Value Ref Range   Influenza A ([place]) DETECTED    Influenza B [facility]) NOT DETECTED       Assessment & Plan:   Problem List Items Addressed This Visit      Cardiovascular and Mediastinum   Hypertension - Primary    The current medical regimen is effective;  continue present plan and medications.       Relevant Orders   Basic metabolic panel    Other Visit Diagnoses    Acute sinusitis, recurrence not specified, unspecified location        Discussed care and treatment use of medications caution with cough syrup and driving May add on decongestants Tylenol sinus etc.    Relevant Medications    azithromycin (ZITHROMAX) 250 MG tablet  Codeine Polt-Chlorphen Polt ER (TUZISTRA XR) 14.7-2.8 MG/5ML SUER        Follow up plan: Return in about 6 months (around 12/26/2015) for Physical Exam.

## 2015-06-25 NOTE — Assessment & Plan Note (Signed)
The current medical regimen is effective;  continue present plan and medications.  

## 2015-06-26 ENCOUNTER — Encounter: Payer: Self-pay | Admitting: Family Medicine

## 2015-06-26 LAB — BASIC METABOLIC PANEL
BUN/Creatinine Ratio: 13 (ref 10–24)
BUN: 13 mg/dL (ref 8–27)
CALCIUM: 9.6 mg/dL (ref 8.6–10.2)
CHLORIDE: 100 mmol/L (ref 96–106)
CO2: 24 mmol/L (ref 18–29)
CREATININE: 0.99 mg/dL (ref 0.76–1.27)
GFR calc Af Amer: 93 mL/min/{1.73_m2} (ref >59)
GFR calc non Af Amer: 81 mL/min/{1.73_m2} (ref >59)
Glucose: 101 mg/dL — ABNORMAL HIGH (ref 65–99)
POTASSIUM: 4.5 mmol/L (ref 3.5–5.2)
Sodium: 140 mmol/L (ref 134–144)

## 2015-08-28 DIAGNOSIS — H524 Presbyopia: Secondary | ICD-10-CM | POA: Diagnosis not present

## 2015-09-04 DIAGNOSIS — M436 Torticollis: Secondary | ICD-10-CM | POA: Diagnosis not present

## 2015-09-04 DIAGNOSIS — M531 Cervicobrachial syndrome: Secondary | ICD-10-CM | POA: Diagnosis not present

## 2015-09-04 DIAGNOSIS — M9901 Segmental and somatic dysfunction of cervical region: Secondary | ICD-10-CM | POA: Diagnosis not present

## 2015-09-10 DIAGNOSIS — M531 Cervicobrachial syndrome: Secondary | ICD-10-CM | POA: Diagnosis not present

## 2015-09-10 DIAGNOSIS — M9901 Segmental and somatic dysfunction of cervical region: Secondary | ICD-10-CM | POA: Diagnosis not present

## 2015-09-10 DIAGNOSIS — M436 Torticollis: Secondary | ICD-10-CM | POA: Diagnosis not present

## 2015-09-23 DIAGNOSIS — M5386 Other specified dorsopathies, lumbar region: Secondary | ICD-10-CM | POA: Diagnosis not present

## 2015-09-23 DIAGNOSIS — G5722 Lesion of femoral nerve, left lower limb: Secondary | ICD-10-CM | POA: Diagnosis not present

## 2015-09-23 DIAGNOSIS — M9901 Segmental and somatic dysfunction of cervical region: Secondary | ICD-10-CM | POA: Diagnosis not present

## 2015-09-25 DIAGNOSIS — G5722 Lesion of femoral nerve, left lower limb: Secondary | ICD-10-CM | POA: Diagnosis not present

## 2015-09-25 DIAGNOSIS — M5386 Other specified dorsopathies, lumbar region: Secondary | ICD-10-CM | POA: Diagnosis not present

## 2015-10-28 DIAGNOSIS — M531 Cervicobrachial syndrome: Secondary | ICD-10-CM | POA: Diagnosis not present

## 2015-10-28 DIAGNOSIS — G44219 Episodic tension-type headache, not intractable: Secondary | ICD-10-CM | POA: Diagnosis not present

## 2015-10-28 DIAGNOSIS — M530 Cervicocranial syndrome: Secondary | ICD-10-CM | POA: Diagnosis not present

## 2015-11-08 DIAGNOSIS — M531 Cervicobrachial syndrome: Secondary | ICD-10-CM | POA: Diagnosis not present

## 2015-11-08 DIAGNOSIS — M9901 Segmental and somatic dysfunction of cervical region: Secondary | ICD-10-CM | POA: Diagnosis not present

## 2015-11-08 DIAGNOSIS — M9902 Segmental and somatic dysfunction of thoracic region: Secondary | ICD-10-CM | POA: Diagnosis not present

## 2015-12-25 ENCOUNTER — Encounter: Payer: BLUE CROSS/BLUE SHIELD | Admitting: Family Medicine

## 2015-12-31 ENCOUNTER — Encounter: Payer: Self-pay | Admitting: Family Medicine

## 2015-12-31 ENCOUNTER — Ambulatory Visit (INDEPENDENT_AMBULATORY_CARE_PROVIDER_SITE_OTHER): Payer: BLUE CROSS/BLUE SHIELD | Admitting: Family Medicine

## 2015-12-31 VITALS — BP 132/86 | HR 85 | Temp 98.1°F | Ht 66.0 in | Wt 182.0 lb

## 2015-12-31 DIAGNOSIS — E039 Hypothyroidism, unspecified: Secondary | ICD-10-CM | POA: Diagnosis not present

## 2015-12-31 DIAGNOSIS — Z0000 Encounter for general adult medical examination without abnormal findings: Secondary | ICD-10-CM

## 2015-12-31 DIAGNOSIS — Z125 Encounter for screening for malignant neoplasm of prostate: Secondary | ICD-10-CM | POA: Diagnosis not present

## 2015-12-31 DIAGNOSIS — N400 Enlarged prostate without lower urinary tract symptoms: Secondary | ICD-10-CM | POA: Diagnosis not present

## 2015-12-31 DIAGNOSIS — Z23 Encounter for immunization: Secondary | ICD-10-CM | POA: Diagnosis not present

## 2015-12-31 DIAGNOSIS — I10 Essential (primary) hypertension: Secondary | ICD-10-CM | POA: Diagnosis not present

## 2015-12-31 LAB — URINALYSIS, ROUTINE W REFLEX MICROSCOPIC
Bilirubin, UA: NEGATIVE
Glucose, UA: NEGATIVE
Ketones, UA: NEGATIVE
Leukocytes, UA: NEGATIVE
NITRITE UA: NEGATIVE
PH UA: 7 (ref 5.0–7.5)
RBC, UA: NEGATIVE
Specific Gravity, UA: 1.02 (ref 1.005–1.030)
UUROB: 0.2 mg/dL (ref 0.2–1.0)

## 2015-12-31 LAB — MICROSCOPIC EXAMINATION
Epithelial Cells (non renal): NONE SEEN /hpf (ref 0–10)
RBC, UA: NONE SEEN /hpf (ref 0–?)
WBC UA: NONE SEEN /HPF (ref 0–?)

## 2015-12-31 MED ORDER — AMLODIPINE BESYLATE 5 MG PO TABS: 5.0000 mg | ORAL_TABLET | Freq: Every day | ORAL | 4 refills | Status: DC

## 2015-12-31 MED ORDER — LEVOTHYROXINE SODIUM 150 MCG PO TABS: 150.0000 ug | ORAL_TABLET | Freq: Every day | ORAL | 4 refills | Status: DC

## 2015-12-31 MED ORDER — BENAZEPRIL HCL 40 MG PO TABS: 40.0000 mg | ORAL_TABLET | Freq: Every day | ORAL | 4 refills | Status: DC

## 2015-12-31 MED ORDER — FLUTICASONE PROPIONATE 50 MCG/ACT NA SUSP: NASAL | 12 refills | Status: DC

## 2015-12-31 NOTE — Assessment & Plan Note (Signed)
stable °

## 2015-12-31 NOTE — Assessment & Plan Note (Signed)
The current medical regimen is effective;  continue present plan and medications.  

## 2015-12-31 NOTE — Progress Notes (Signed)
BP 132/86   Pulse 85   Temp 98.1 F (36.7 C)   Ht 5\' 6"  (1.676 m)   Wt 182 lb (82.6 kg)   SpO2 99%   BMI 29.38 kg/m    Subjective:    Patient ID: Frank Williamson, male    DOB: 02/06/1951, 65 y.o.   MRN: [id number]  HPI: Frank Williamson is a 65 y.o. male  Chief Complaint  Patient presents with  . Annual Exam   Neck pain up into his neck down into his trapezius is been ongoing for several months been to chiropractor several times with some relief has tried different pillows with some relief but still bothersome taken Aleve at night and ibuprofen during the day does help. Has changed job accommodations and is much pulling and seems to be a little better on days he is able to do less pulling. Blood pressure doing well  Thyroid doing well no complaints  Relevant past medical, surgical, family and social history reviewed and updated as indicated. Interim medical history since our last visit reviewed. Allergies and medications reviewed and updated.  Review of Systems  Constitutional: Negative.   HENT: Negative.   Eyes: Negative.   Respiratory: Negative.   Cardiovascular: Negative.   Gastrointestinal: Negative.   Endocrine: Negative.   Genitourinary: Negative.   Musculoskeletal: Negative.   Skin: Negative.   Allergic/Immunologic: Negative.   Neurological: Negative.   Hematological: Negative.   Psychiatric/Behavioral: Negative.     Per HPI unless specifically indicated above     Objective:    BP 132/86   Pulse 85   Temp 98.1 F (36.7 C)   Ht 5\' 6"  (1.676 m)   Wt 182 lb (82.6 kg)   SpO2 99%   BMI 29.38 kg/m   Wt Readings from Last 3 Encounters:  12/31/15 182 lb (82.6 kg)  06/25/15 178 lb (80.7 kg)  04/12/15 174 lb (78.9 kg)    Physical Exam  Constitutional: He is oriented to person, place, and time. He appears well-developed and well-nourished.  HENT:  Head: Normocephalic and atraumatic.  Right Ear: External ear normal.  Left Ear: External ear normal.    Eyes: Conjunctivae and EOM are normal. Pupils are equal, round, and reactive to light.  Neck: Normal range of motion. Neck supple.  Cardiovascular: Normal rate, regular rhythm, normal heart sounds and intact distal pulses.   Pulmonary/Chest: Effort normal and breath sounds normal.  Abdominal: Soft. Bowel sounds are normal. There is no splenomegaly or hepatomegaly.  Genitourinary: Rectum normal and penis normal.  Genitourinary Comments: Prostate enlarged  Musculoskeletal: Normal range of motion.  Neurological: He is alert and oriented to person, place, and time. He has normal reflexes.  Skin: No rash noted. No erythema.  Psychiatric: He has a normal mood and affect. His behavior is normal. Judgment and thought content normal.    Results for orders placed or performed in visit on [id number]  Basic metabolic panel  Result Value Ref Range   Glucose 101 (H) 65 - 99 mg/dL   BUN 13 8 - 27 mg/dL   Creatinine, Ser 0.99 0.76 - 1.27 mg/dL   GFR calc non Af Amer 81 >59 mL/min/1.73   GFR calc Af Amer 93 >59 mL/min/1.73   BUN/Creatinine Ratio 13 10 - 24   Sodium 140 134 - 144 mmol/L   Potassium 4.5 3.5 - 5.2 mmol/L   Chloride 100 96 - 106 mmol/L   CO2 24 18 - 29 mmol/L   Calcium 9.6 8.6 -  10.2 mg/dL      Assessment & Plan:   Problem List Items Addressed This Visit      Cardiovascular and Mediastinum   Hypertension - Primary    The current medical regimen is effective;  continue present plan and medications.       Relevant Medications   amLODipine (NORVASC) 5 MG tablet   benazepril (LOTENSIN) 40 MG tablet   Other Relevant Orders   CBC with Differential/Platelet   Urinalysis, Routine w reflex microscopic (not at [facility])     Endocrine   Hypothyroid    The current medical regimen is effective;  continue present plan and medications.       Relevant Medications   levothyroxine (SYNTHROID, LEVOTHROID) 150 MCG tablet     Genitourinary   BPH (benign prostatic hyperplasia)    stable       Relevant Orders   PSA    Other Visit Diagnoses    PE (physical exam), annual       Relevant Orders   CBC with Differential/Platelet   Comprehensive metabolic panel   Lipid Profile   Urinalysis, Routine w reflex microscopic (not at [facility])   PSA   TSH   Screening PSA (prostate specific antigen)       Relevant Orders   PSA   Needs flu shot       Encounter for immunization       Relevant Orders   Flu Vaccine QUAD 36+ mos IM (Completed)       Follow up plan: Return in about 6 months (around 06/29/2016) for BMP.

## 2016-01-01 ENCOUNTER — Encounter: Payer: Self-pay | Admitting: Family Medicine

## 2016-01-01 LAB — TSH: TSH: 3.75 u[IU]/mL (ref 0.450–4.500)

## 2016-01-01 LAB — COMPREHENSIVE METABOLIC PANEL
ALBUMIN: 4.6 g/dL (ref 3.6–4.8)
ALK PHOS: 65 IU/L (ref 39–117)
ALT: 32 IU/L (ref 0–44)
AST: 24 IU/L (ref 0–40)
Albumin/Globulin Ratio: 2 (ref 1.2–2.2)
BILIRUBIN TOTAL: 1 mg/dL (ref 0.0–1.2)
BUN / CREAT RATIO: 22 (ref 10–24)
BUN: 17 mg/dL (ref 8–27)
CHLORIDE: 98 mmol/L (ref 96–106)
CO2: 23 mmol/L (ref 18–29)
CREATININE: 0.77 mg/dL (ref 0.76–1.27)
Calcium: 9.6 mg/dL (ref 8.6–10.2)
GFR calc Af Amer: 112 mL/min/{1.73_m2} (ref >59)
GFR calc non Af Amer: 97 mL/min/{1.73_m2} (ref >59)
GLUCOSE: 79 mg/dL (ref 65–99)
Globulin, Total: 2.3 g/dL (ref 1.5–4.5)
Potassium: 4 mmol/L (ref 3.5–5.2)
Sodium: 139 mmol/L (ref 134–144)
Total Protein: 6.9 g/dL (ref 6.0–8.5)

## 2016-01-01 LAB — CBC WITH DIFFERENTIAL/PLATELET
BASOS ABS: 0 10*3/uL (ref 0.0–0.2)
Basos: 0 %
EOS (ABSOLUTE): 0.2 10*3/uL (ref 0.0–0.4)
Eos: 4 %
HEMOGLOBIN: 14.9 g/dL (ref 12.6–17.7)
Hematocrit: 41.8 % (ref 37.5–51.0)
IMMATURE GRANS (ABS): 0 10*3/uL (ref 0.0–0.1)
Immature Granulocytes: 0 %
LYMPHS ABS: 2.2 10*3/uL (ref 0.7–3.1)
LYMPHS: 36 %
MCH: 31.2 pg (ref 26.6–33.0)
MCHC: 35.6 g/dL (ref 31.5–35.7)
MCV: 87 fL (ref 79–97)
Monocytes Absolute: 0.5 10*3/uL (ref 0.1–0.9)
Monocytes: 9 %
Neutrophils Absolute: 3.1 10*3/uL (ref 1.4–7.0)
Neutrophils: 51 %
PLATELETS: 224 10*3/uL (ref 150–379)
RBC: 4.78 x10E6/uL (ref 4.14–5.80)
RDW: 12.8 % (ref 12.3–15.4)
WBC: 6 10*3/uL (ref 3.4–10.8)

## 2016-01-01 LAB — PSA: Prostate Specific Ag, Serum: 1 ng/mL (ref 0.0–4.0)

## 2016-01-01 LAB — LIPID PANEL
CHOLESTEROL TOTAL: 216 mg/dL — AB (ref 100–199)
Chol/HDL Ratio: 4.7 ratio units (ref 0.0–5.0)
HDL: 46 mg/dL (ref >39)
LDL CALC: 139 mg/dL — AB (ref 0–99)
TRIGLYCERIDES: 157 mg/dL — AB (ref 0–149)
VLDL CHOLESTEROL CAL: 31 mg/dL (ref 5–40)

## 2016-01-02 ENCOUNTER — Encounter: Payer: Self-pay | Admitting: Family Medicine

## 2016-01-02 ENCOUNTER — Other Ambulatory Visit: Payer: Self-pay | Admitting: Family Medicine

## 2016-01-02 MED ORDER — SILDENAFIL CITRATE 20 MG PO TABS: 20.0000 mg | ORAL_TABLET | ORAL | 4 refills | Status: DC

## 2016-01-20 ENCOUNTER — Other Ambulatory Visit: Payer: Self-pay | Admitting: Family Medicine

## 2016-06-10 ENCOUNTER — Other Ambulatory Visit: Payer: Self-pay | Admitting: Family Medicine

## 2016-06-30 ENCOUNTER — Ambulatory Visit: Payer: BLUE CROSS/BLUE SHIELD | Admitting: Family Medicine

## 2016-07-08 ENCOUNTER — Ambulatory Visit (INDEPENDENT_AMBULATORY_CARE_PROVIDER_SITE_OTHER): Payer: BLUE CROSS/BLUE SHIELD | Admitting: Family Medicine

## 2016-07-08 ENCOUNTER — Encounter: Payer: Self-pay | Admitting: Family Medicine

## 2016-07-08 VITALS — BP 120/79 | HR 90 | Wt 185.0 lb

## 2016-07-08 DIAGNOSIS — E039 Hypothyroidism, unspecified: Secondary | ICD-10-CM | POA: Diagnosis not present

## 2016-07-08 DIAGNOSIS — I10 Essential (primary) hypertension: Secondary | ICD-10-CM | POA: Diagnosis not present

## 2016-07-08 NOTE — Progress Notes (Signed)
BP 120/79   Pulse 90   Wt 185 lb (83.9 kg)   SpO2 99%   BMI 29.86 kg/m    Subjective:    Patient ID: Frank Williamson, male    DOB: October 05, 1950, 66 y.o.   MRN: [id number]  HPI: Frank Williamson is a 66 y.o. male  Chief Complaint  Patient presents with  . Follow-up  Blood pressure doing well no complaints taking Benzapril and amlodipine one in the morning 1 at night with good control no side effects. Takes Flonase at nighttime seems to help. Takes thyroid in the mornings without problems or issues and faithfully.  Relevant past medical, surgical, family and social history reviewed and updated as indicated. Interim medical history since our last visit reviewed. Allergies and medications reviewed and updated.  Review of Systems  Constitutional: Negative.   Respiratory: Negative.   Cardiovascular: Negative.     Per HPI unless specifically indicated above     Objective:    BP 120/79   Pulse 90   Wt 185 lb (83.9 kg)   SpO2 99%   BMI 29.86 kg/m   Wt Readings from Last 3 Encounters:  07/08/16 185 lb (83.9 kg)  12/31/15 182 lb (82.6 kg)  06/25/15 178 lb (80.7 kg)    Physical Exam  Constitutional: He is oriented to person, place, and time. He appears well-developed and well-nourished.  HENT:  Head: Normocephalic and atraumatic.  Eyes: Conjunctivae and EOM are normal.  Neck: Normal range of motion.  Cardiovascular: Normal rate, regular rhythm and normal heart sounds.   Pulmonary/Chest: Effort normal and breath sounds normal.  Musculoskeletal: Normal range of motion.  Neurological: He is alert and oriented to person, place, and time.  Skin: No erythema.  Psychiatric: He has a normal mood and affect. His behavior is normal. Judgment and thought content normal.    Results for orders placed or performed in visit on 12/31/15  Microscopic Examination  Result Value Ref Range   WBC, UA None seen 0 - 5 /hpf   RBC, UA None seen 0 - 2 /hpf   Epithelial Cells (non renal)  None seen 0 - 10 /hpf   Crystals Present (A) N/A   Crystal Type Amorphous Sediment N/A   Mucus, UA Present (A) Not Estab.   Bacteria, UA Few (A) None seen/Few  CBC with Differential/Platelet  Result Value Ref Range   WBC 6.0 3.4 - 10.8 x10E3/uL   RBC 4.78 4.14 - 5.80 x10E6/uL   Hemoglobin 14.9 12.6 - 17.7 g/dL   Hematocrit 41.8 37.5 - 51.0 %   MCV 87 79 - 97 fL   MCH 31.2 26.6 - 33.0 pg   MCHC 35.6 31.5 - 35.7 g/dL   RDW 12.8 12.3 - 15.4 %   Platelets 224 150 - 379 x10E3/uL   Neutrophils 51 Not Estab. %   Lymphs 36 Not Estab. %   Monocytes 9 Not Estab. %   Eos 4 Not Estab. %   Basos 0 Not Estab. %   Neutrophils Absolute 3.1 1.4 - 7.0 x10E3/uL   Lymphocytes Absolute 2.2 0.7 - 3.1 x10E3/uL   Monocytes Absolute 0.5 0.1 - 0.9 x10E3/uL   EOS (ABSOLUTE) 0.2 0.0 - 0.4 x10E3/uL   Basophils Absolute 0.0 0.0 - 0.2 x10E3/uL   Immature Granulocytes 0 Not Estab. %   Immature Grans (Abs) 0.0 0.0 - 0.1 x10E3/uL  Comprehensive metabolic panel  Result Value Ref Range   Glucose 79 65 - 99 mg/dL   BUN 17  8 - 27 mg/dL   Creatinine, Ser 0.77 0.76 - 1.27 mg/dL   GFR calc non Af Amer 97 >59 mL/min/1.73   GFR calc Af Amer 112 >59 mL/min/1.73   BUN/Creatinine Ratio 22 10 - 24   Sodium 139 134 - 144 mmol/L   Potassium 4.0 3.5 - 5.2 mmol/L   Chloride 98 96 - 106 mmol/L   CO2 23 18 - 29 mmol/L   Calcium 9.6 8.6 - 10.2 mg/dL   Total Protein 6.9 6.0 - 8.5 g/dL   Albumin 4.6 3.6 - 4.8 g/dL   Globulin, Total 2.3 1.5 - 4.5 g/dL   Albumin/Globulin Ratio 2.0 1.2 - 2.2   Bilirubin Total 1.0 0.0 - 1.2 mg/dL   Alkaline Phosphatase 65 39 - 117 IU/L   AST 24 0 - 40 IU/L   ALT 32 0 - 44 IU/L  Lipid Profile  Result Value Ref Range   Cholesterol, Total 216 (H) 100 - 199 mg/dL   Triglycerides 157 (H) 0 - 149 mg/dL   HDL 46 >39 mg/dL   VLDL Cholesterol Cal 31 5 - 40 mg/dL   LDL Calculated 139 (H) 0 - 99 mg/dL   Chol/HDL Ratio 4.7 0.0 - 5.0 ratio units  Urinalysis, Routine w reflex microscopic (not at  [facility])  Result Value Ref Range   Specific Gravity, UA 1.020 1.005 - 1.030   pH, UA 7.0 5.0 - 7.5   Color, UA Yellow Yellow   Appearance Ur Cloudy (A) Clear   Leukocytes, UA Negative Negative   Protein, UA 1+ (A) Negative/Trace   Glucose, UA Negative Negative   Ketones, UA Negative Negative   RBC, UA Negative Negative   Bilirubin, UA Negative Negative   Urobilinogen, Ur 0.2 0.2 - 1.0 mg/dL   Nitrite, UA Negative Negative   Microscopic Examination See below:   PSA  Result Value Ref Range   Prostate Specific Ag, Serum 1.0 0.0 - 4.0 ng/mL  TSH  Result Value Ref Range   TSH 3.750 0.450 - 4.500 uIU/mL      Assessment & Plan:   Problem List Items Addressed This Visit      Cardiovascular and Mediastinum   Hypertension - Primary    The current medical regimen is effective;  continue present plan and medications.       Relevant Orders   Basic metabolic panel     Endocrine   Hypothyroid    The current medical regimen is effective;  continue present plan and medications.           Follow up plan: Return in about 6 months (around 01/08/2017) for Physical Exam.

## 2016-07-08 NOTE — Assessment & Plan Note (Signed)
The current medical regimen is effective;  continue present plan and medications.  

## 2016-07-09 ENCOUNTER — Encounter: Payer: Self-pay | Admitting: Family Medicine

## 2016-07-09 LAB — BASIC METABOLIC PANEL
BUN / CREAT RATIO: 15 (ref 10–24)
BUN: 15 mg/dL (ref 8–27)
CO2: 23 mmol/L (ref 18–29)
CREATININE: 0.97 mg/dL (ref 0.76–1.27)
Calcium: 9.7 mg/dL (ref 8.6–10.2)
Chloride: 102 mmol/L (ref 96–106)
GFR calc Af Amer: 95 mL/min/{1.73_m2} (ref >59)
GFR, EST NON AFRICAN AMERICAN: 82 mL/min/{1.73_m2} (ref >59)
Glucose: 82 mg/dL (ref 65–99)
Potassium: 4.6 mmol/L (ref 3.5–5.2)
SODIUM: 141 mmol/L (ref 134–144)

## 2016-08-26 DIAGNOSIS — M542 Cervicalgia: Secondary | ICD-10-CM | POA: Diagnosis not present

## 2016-08-26 DIAGNOSIS — M7541 Impingement syndrome of right shoulder: Secondary | ICD-10-CM | POA: Diagnosis not present

## 2016-09-23 DIAGNOSIS — M7541 Impingement syndrome of right shoulder: Secondary | ICD-10-CM | POA: Diagnosis not present

## 2016-12-31 ENCOUNTER — Encounter: Payer: BLUE CROSS/BLUE SHIELD | Admitting: Family Medicine

## 2017-01-12 ENCOUNTER — Ambulatory Visit
Admission: EM | Admit: 2017-01-12 | Discharge: 2017-01-12 | Disposition: A | Discharge status: Home / Self Care | Payer: BLUE CROSS/BLUE SHIELD | Attending: Family Medicine | Admitting: Family Medicine

## 2017-01-12 ENCOUNTER — Other Ambulatory Visit: Payer: Self-pay

## 2017-01-12 DIAGNOSIS — K529 Noninfective gastroenteritis and colitis, unspecified: Secondary | ICD-10-CM | POA: Diagnosis not present

## 2017-01-12 MED ORDER — CIPROFLOXACIN HCL 500 MG PO TABS: 500.0000 mg | ORAL_TABLET | Freq: Two times a day (BID) | ORAL | 0 refills | Status: DC

## 2017-01-12 NOTE — Discharge Instructions (Signed)
Clear liquids, then advance slowly to bland diet, then full diet over 2-3 days as tolerated

## 2017-01-12 NOTE — ED Provider Notes (Signed)
[facility]    CSN: [id number] Arrival date & time: 01/12/17  1039     History   Chief Complaint Chief Complaint  Patient presents with  . Diarrhea    HPI Frank Williamson is a 67 y.o. male.   The history is provided by the patient.  Diarrhea  Quality:  Watery Severity:  Moderate Onset quality:  Sudden Duration:  3 days Timing:  Constant Progression:  Unchanged Relieved by:  Anti-motility medications Associated symptoms: vomiting (4 days ago when symptoms began; none since)   Associated symptoms: no abdominal pain, no arthralgias, no chills, no recent cough, no diaphoresis, no fever, no headaches, no myalgias and no URI   Risk factors: suspect food intake   Risk factors: no recent antibiotic use, no sick contacts and no travel to endemic areas     Past Medical History:  Diagnosis Date  . Hypertension     Patient Active Problem List   Diagnosis Date Noted  . BPH (benign prostatic hyperplasia) 10/16/2014  . Hypertension   . Hypothyroid     Past Surgical History:  Procedure Laterality Date  . COLONOSCOPY  2004  . HERNIA REPAIR  01/26/13   right inguinal hernia and umbilical hernia       Home Medications    Prior to Admission medications   Medication Sig Start Date End Date Taking? Authorizing Provider  amLODipine (NORVASC) 5 MG tablet Take 1 tablet (5 mg total) by mouth daily. 12/31/15  Yes Guadalupe Maple, MD  aspirin 81 MG tablet Take 81 mg by mouth daily.   Yes [provider]  benazepril (LOTENSIN) 40 MG tablet Take 1 tablet (40 mg total) by mouth daily. 12/31/15  Yes Guadalupe Maple, MD  fluticasone (FLONASE) 50 MCG/ACT nasal spray USE 2 SPRAYS IN EACH NOSTRIL DAILY 06/11/16  Yes Crissman, Jeannette How, MD  levothyroxine (SYNTHROID, LEVOTHROID) 150 MCG tablet Take 1 tablet (150 mcg total) by mouth daily before breakfast. 12/31/15  Yes Crissman, Jeannette How, MD  Multiple Vitamin (MULTIVITAMIN) tablet Take 1 tablet by mouth daily.   Yes  [provider]  Saw Palmetto 450 MG CAPS Take 2 capsules by mouth daily.   Yes [provider]  sildenafil (REVATIO) 20 MG tablet Take 1 tablet (20 mg total) by mouth 1 day or 1 dose. 1-5 prn 01/02/16  Yes Crissman, Jeannette How, MD  ciprofloxacin (CIPRO) 500 MG tablet Take 1 tablet (500 mg total) by mouth every 12 (twelve) hours. 01/12/17   Norval Gable, MD    Family History Family History  Problem Relation Age of Onset  . Heart attack Father   . COPD Father   . Heart disease Father   . Diabetes Brother        diet controlled  . Dementia Maternal Grandmother   . Cancer Maternal Grandfather        bone  . Dementia Paternal Grandmother   . Cancer Paternal Aunt        stomach  . Cancer Paternal Uncle        colon  . Hypertension Neg Hx   . Stroke Neg Hx     Social History Social History   Tobacco Use  . Smoking status: Never Smoker  . Smokeless tobacco: Never Used  Substance Use Topics  . Alcohol use: Yes    Comment: on occasion  . Drug use: No     Allergies   Patient has no known allergies.   Review of Systems Review  of Systems  Constitutional: Negative for chills, diaphoresis and fever.  Gastrointestinal: Positive for diarrhea and vomiting (4 days ago when symptoms began; none since). Negative for abdominal pain.  Musculoskeletal: Negative for arthralgias and myalgias.  Neurological: Negative for headaches.     Physical Exam Triage Vital Signs ED Triage Vitals  Enc Vitals Group     BP 01/12/17 1049 117/75     Pulse Rate 01/12/17 1049 83     Resp 01/12/17 1049 18     Temp 01/12/17 1049 97.8 F (36.6 C)     Temp Source 01/12/17 1049 Oral     SpO2 01/12/17 1049 100 %     Weight 01/12/17 1047 173 lb (78.5 kg)     Height 01/12/17 1047 5' 5.5" (1.664 m)     Head Circumference --      Peak Flow --      Pain Score 01/12/17 1048 0     Pain Loc --      Pain Edu? --      Excl. in [place]? --    No data found.  Updated Vital Signs BP 117/75 (BP  Location: Left Arm)   Pulse 83   Temp 97.8 F (36.6 C) (Oral)   Resp 18   Ht 5' 5.5" (1.664 m)   Wt 173 lb (78.5 kg)   SpO2 100%   BMI 28.35 kg/m   Visual Acuity Right Eye Distance:   Left Eye Distance:   Bilateral Distance:    Right Eye Near:   Left Eye Near:    Bilateral Near:     Physical Exam  Constitutional: He is oriented to person, place, and time. He appears well-developed and well-nourished. No distress.  HENT:  Head: Normocephalic and atraumatic.  Cardiovascular: Normal rate, regular rhythm, normal heart sounds and intact distal pulses.  No murmur heard. Pulmonary/Chest: Effort normal and breath sounds normal. No respiratory distress. He has no wheezes. He has no rales.  Abdominal: Soft. Bowel sounds are normal. He exhibits no distension and no mass. There is no tenderness. There is no rebound and no guarding.  Neurological: He is alert and oriented to person, place, and time.  Skin: No rash noted. He is not diaphoretic.  Nursing note and vitals reviewed.    UC Treatments / Results  Labs (all labs ordered are listed, but only abnormal results are displayed) Labs Reviewed - No data to display  EKG  EKG Interpretation None       Radiology No results found.  Procedures Procedures (including critical care time)  Medications Ordered in UC Medications - No data to display   Initial Impression / Assessment and Plan / UC Course  I have reviewed the triage vital signs and the nursing notes.  Pertinent labs & imaging results that were available during my care of the patient were reviewed by me and considered in my medical decision making (see chart for details).       Final Clinical Impressions(s) / UC Diagnoses   Final diagnoses:  Gastroenteritis, acute    ED Discharge Orders        Ordered    ciprofloxacin (CIPRO) 500 MG tablet  Every 12 hours     01/12/17 1138     1. diagnosis reviewed with patient 2. rx as per orders above; reviewed  possible side effects, interactions, risks and benefits  3. Recommend supportive treatment with fluids (clear liquids) then advance slowly as tolerated; otc imodium prn  4. Follow-up prn if symptoms worsen  or don't improve  Controlled Substance Prescriptions [place] Controlled Substance Registry consulted? Not Applicable   Norval Gable, MD 01/12/17 1157

## 2017-01-12 NOTE — ED Triage Notes (Signed)
Patient complains of diarrhea. Patient states that symptoms started on Saturday pm around 10pm. Patient reports that he started vomiting and diarrhea. Patient states that diarrhea has continued on. Patient reports some fever at night time.

## 2017-01-19 ENCOUNTER — Ambulatory Visit (INDEPENDENT_AMBULATORY_CARE_PROVIDER_SITE_OTHER): Payer: BLUE CROSS/BLUE SHIELD | Admitting: Family Medicine

## 2017-01-19 ENCOUNTER — Encounter: Payer: Self-pay | Admitting: Family Medicine

## 2017-01-19 VITALS — BP 118/79 | HR 105 | Ht 66.54 in | Wt 178.0 lb

## 2017-01-19 DIAGNOSIS — Z0000 Encounter for general adult medical examination without abnormal findings: Secondary | ICD-10-CM | POA: Diagnosis not present

## 2017-01-19 DIAGNOSIS — E039 Hypothyroidism, unspecified: Secondary | ICD-10-CM

## 2017-01-19 DIAGNOSIS — I10 Essential (primary) hypertension: Secondary | ICD-10-CM

## 2017-01-19 DIAGNOSIS — Z23 Encounter for immunization: Secondary | ICD-10-CM

## 2017-01-19 LAB — URINALYSIS, ROUTINE W REFLEX MICROSCOPIC
BILIRUBIN UA: NEGATIVE
GLUCOSE, UA: NEGATIVE
KETONES UA: NEGATIVE
Leukocytes, UA: NEGATIVE
Nitrite, UA: NEGATIVE
Protein, UA: NEGATIVE
RBC, UA: NEGATIVE
Specific Gravity, UA: 1.02 (ref 1.005–1.030)
UUROB: 0.2 mg/dL (ref 0.2–1.0)
pH, UA: 6.5 (ref 5.0–7.5)

## 2017-01-19 MED ORDER — BENAZEPRIL HCL 40 MG PO TABS: 40.0000 mg | ORAL_TABLET | Freq: Every day | ORAL | 4 refills | Status: DC

## 2017-01-19 MED ORDER — LEVOTHYROXINE SODIUM 150 MCG PO TABS: 150.0000 ug | ORAL_TABLET | Freq: Every day | ORAL | 4 refills | Status: DC

## 2017-01-19 MED ORDER — AMLODIPINE BESYLATE 5 MG PO TABS: 5.0000 mg | ORAL_TABLET | Freq: Every day | ORAL | 4 refills | Status: DC

## 2017-01-19 MED ORDER — FLUTICASONE PROPIONATE 50 MCG/ACT NA SUSP: 2.0000 | Freq: Every day | NASAL | 12 refills | Status: AC

## 2017-01-19 MED ORDER — SILDENAFIL CITRATE 20 MG PO TABS: 20.0000 mg | ORAL_TABLET | ORAL | 4 refills | Status: AC

## 2017-01-19 NOTE — Progress Notes (Signed)
BP 118/79   Pulse (!) 105   Ht 5' 6.54" (1.69 m)   Wt 178 lb (80.7 kg)   SpO2 99%   BMI 28.27 kg/m    Subjective:    Patient ID: Frank Williamson, male    DOB: 1950/02/29, 66 y.o.   MRN: [id number]  HPI: Frank Williamson is a 66 y.o. male  Annual exam  Patient with complaints of marked fatigue no energy to doing extra. But on lifestyle review has limited nutrition especially in the morning and limited sleep with 5 5-1/2 hours of sleep at night. Patient's also follow-up blood pressure good control no issues or complaints from medications. Same with thyroid takes faithfully without problems. EGD does okay with sildenafil. 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  Relevant past medical, surgical, family and social history reviewed and updated as indicated. Interim medical history since our last visit reviewed. Allergies and medications reviewed and updated.  Review of Systems  Constitutional: Negative.   HENT: Negative.   Eyes: Negative.   Respiratory: Negative.   Cardiovascular: Negative.   Gastrointestinal: Negative.   Endocrine: Negative.   Genitourinary: Negative.   Musculoskeletal: Negative.   Skin: Negative.   Allergic/Immunologic: Negative.   Neurological: Negative.   Hematological: Negative.   Psychiatric/Behavioral: Negative.     Per HPI unless specifically indicated above     Objective:    BP 118/79   Pulse (!) 105   Ht 5' 6.54" (1.69 m)   Wt 178 lb (80.7 kg)   SpO2 99%   BMI 28.27 kg/m   Wt Readings from Last 3 Encounters:  01/19/17 178 lb (80.7 kg)  01/12/17 173 lb (78.5 kg)  07/08/16 185 lb (83.9 kg)    Physical Exam  Constitutional: He is oriented to person, place, and time. He appears well-developed and well-nourished.  HENT:  Head: Normocephalic and atraumatic.  Right Ear: External ear normal.  Left Ear: External ear normal.  Eyes: Conjunctivae and  EOM are normal. Pupils are equal, round, and reactive to light.  Neck: Normal range of motion. Neck supple.  Cardiovascular: Normal rate, regular rhythm, normal heart sounds and intact distal pulses.  Pulmonary/Chest: Effort normal and breath sounds normal.  Abdominal: Soft. Bowel sounds are normal. There is no splenomegaly or hepatomegaly.  Genitourinary: Rectum normal, prostate normal and penis normal.  Musculoskeletal: Normal range of motion.  Neurological: He is alert and oriented to person, place, and time. He has normal reflexes.  Skin: No rash noted. No erythema.  Psychiatric: He has a normal mood and affect. His behavior is normal. Judgment and thought content normal.    Results for orders placed or performed in visit on 40/98/11  Basic metabolic panel  Result Value Ref Range   Glucose 82 65 - 99 mg/dL   BUN 15 8 - 27 mg/dL   Creatinine, Ser 0.97 0.76 - 1.27 mg/dL   GFR calc non Af Amer 82 >59 mL/min/1.73   GFR calc Af Amer 95 >59 mL/min/1.73   BUN/Creatinine Ratio 15 10 - 24   Sodium 141 134 - 144 mmol/L   Potassium 4.6 3.5 - 5.2 mmol/L   Chloride 102 96 - 106 mmol/L   CO2 23 18 - 29 mmol/L   Calcium 9.7 8.6 - 10.2 mg/dL      Assessment & Plan:   Problem List Items Addressed This Visit  Cardiovascular and Mediastinum   Hypertension    The current medical regimen is effective;  continue present plan and medications.         Endocrine   Hypothyroid    The current medical regimen is effective;  continue present plan and medications.        Other Visit Diagnoses    Needs flu shot    -  Primary   Relevant Orders   Flu Vaccine QUAD 6+ mos PF IM (Fluarix Quad PF) (Completed)   Need for Tdap vaccination       Relevant Orders   Tdap vaccine greater than or equal to 7yo IM (Completed)     discuss fatigue and sleep rest. We will try this first course checking labs also  Follow up plan: Return in about 6 months (around 07/20/2017) for BMP.

## 2017-01-19 NOTE — Assessment & Plan Note (Signed)
The current medical regimen is effective;  continue present plan and medications.  

## 2017-01-20 ENCOUNTER — Encounter: Payer: Self-pay | Admitting: Family Medicine

## 2017-01-20 LAB — CBC WITH DIFFERENTIAL/PLATELET
BASOS: 1 %
Basophils Absolute: 0.1 10*3/uL (ref 0.0–0.2)
EOS (ABSOLUTE): 0.2 10*3/uL (ref 0.0–0.4)
EOS: 3 %
HEMATOCRIT: 43.1 % (ref 37.5–51.0)
HEMOGLOBIN: 14.4 g/dL (ref 13.0–17.7)
IMMATURE GRANS (ABS): 0 10*3/uL (ref 0.0–0.1)
IMMATURE GRANULOCYTES: 0 %
LYMPHS: 31 %
Lymphocytes Absolute: 1.9 10*3/uL (ref 0.7–3.1)
MCH: 29.7 pg (ref 26.6–33.0)
MCHC: 33.4 g/dL (ref 31.5–35.7)
MCV: 89 fL (ref 79–97)
MONOCYTES: 11 %
MONOS ABS: 0.7 10*3/uL (ref 0.1–0.9)
NEUTROS PCT: 54 %
Neutrophils Absolute: 3.4 10*3/uL (ref 1.4–7.0)
Platelets: 270 10*3/uL (ref 150–379)
RBC: 4.85 x10E6/uL (ref 4.14–5.80)
RDW: 13 % (ref 12.3–15.4)
WBC: 6.1 10*3/uL (ref 3.4–10.8)

## 2017-01-20 LAB — COMPREHENSIVE METABOLIC PANEL
ALBUMIN: 4.6 g/dL (ref 3.6–4.8)
ALK PHOS: 59 IU/L (ref 39–117)
ALT: 49 IU/L — ABNORMAL HIGH (ref 0–44)
AST: 24 IU/L (ref 0–40)
Albumin/Globulin Ratio: 1.9 (ref 1.2–2.2)
BUN / CREAT RATIO: 16 (ref 10–24)
BUN: 12 mg/dL (ref 8–27)
Bilirubin Total: 0.5 mg/dL (ref 0.0–1.2)
CO2: 24 mmol/L (ref 20–29)
CREATININE: 0.74 mg/dL — AB (ref 0.76–1.27)
Calcium: 9.5 mg/dL (ref 8.6–10.2)
Chloride: 105 mmol/L (ref 96–106)
GFR calc Af Amer: 113 mL/min/{1.73_m2} (ref >59)
GFR calc non Af Amer: 97 mL/min/{1.73_m2} (ref >59)
GLUCOSE: 79 mg/dL (ref 65–99)
Globulin, Total: 2.4 g/dL (ref 1.5–4.5)
Potassium: 4.2 mmol/L (ref 3.5–5.2)
Sodium: 144 mmol/L (ref 134–144)
TOTAL PROTEIN: 7 g/dL (ref 6.0–8.5)

## 2017-01-20 LAB — LIPID PANEL
CHOLESTEROL TOTAL: 132 mg/dL (ref 100–199)
Chol/HDL Ratio: 4.1 ratio (ref 0.0–5.0)
HDL: 32 mg/dL — ABNORMAL LOW (ref >39)
LDL CALC: 65 mg/dL (ref 0–99)
Triglycerides: 174 mg/dL — ABNORMAL HIGH (ref 0–149)
VLDL CHOLESTEROL CAL: 35 mg/dL (ref 5–40)

## 2017-01-20 LAB — TSH: TSH: 3.17 u[IU]/mL (ref 0.450–4.500)

## 2017-01-20 LAB — PSA: PROSTATE SPECIFIC AG, SERUM: 1.2 ng/mL (ref 0.0–4.0)

## 2017-07-20 ENCOUNTER — Ambulatory Visit (INDEPENDENT_AMBULATORY_CARE_PROVIDER_SITE_OTHER): Payer: BLUE CROSS/BLUE SHIELD | Admitting: Family Medicine

## 2017-07-20 ENCOUNTER — Encounter: Payer: Self-pay | Admitting: Family Medicine

## 2017-07-20 VITALS — BP 120/78 | HR 82 | Ht 67.0 in | Wt 179.1 lb

## 2017-07-20 DIAGNOSIS — I10 Essential (primary) hypertension: Secondary | ICD-10-CM

## 2017-07-20 DIAGNOSIS — R079 Chest pain, unspecified: Secondary | ICD-10-CM | POA: Diagnosis not present

## 2017-07-20 DIAGNOSIS — E7800 Pure hypercholesterolemia, unspecified: Secondary | ICD-10-CM | POA: Diagnosis not present

## 2017-07-20 DIAGNOSIS — E039 Hypothyroidism, unspecified: Secondary | ICD-10-CM | POA: Diagnosis not present

## 2017-07-20 MED ORDER — OMEPRAZOLE 20 MG PO CPDR: 20.0000 mg | DELAYED_RELEASE_CAPSULE | Freq: Every day | ORAL | 3 refills | Status: DC

## 2017-07-20 NOTE — Assessment & Plan Note (Signed)
The current medical regimen is effective;  continue present plan and medications.  

## 2017-07-20 NOTE — Progress Notes (Signed)
BP 120/78   Pulse 82   Ht 5\' 7"  (1.702 m)   Wt 179 lb 1.6 oz (81.2 kg)   SpO2 98%   BMI 28.05 kg/m    Subjective:    Patient ID: Frank Williamson, male    DOB: 08/18/1950, 67 y.o.   MRN: [id number]  HPI: Frank Williamson is a 67 y.o. male  Chief Complaint  Patient presents with  . Follow-up  . Hypertension  . Gastroesophageal Reflux    Pt having increasing GERD symptoms  Discussed with patient having substernal chest pain no real radiation sometimes comes on with activity sometimes comes on at rest no nausea vomiting diaphoresis associated has not tried other medications symptoms will last 30 minutes or so. Patient has hypertension hypercholesterol and father with coronary artery disease. No complaints of palpitations or other cardiovascular symptoms. Other medication doing well hypertension thyroid no complaints.  Relevant past medical, surgical, family and social history reviewed and updated as indicated. Interim medical history since our last visit reviewed. Allergies and medications reviewed and updated.  Review of Systems  Constitutional: Negative.   Respiratory: Negative.   Cardiovascular: Negative.     Per HPI unless specifically indicated above     Objective:    BP 120/78   Pulse 82   Ht 5\' 7"  (1.702 m)   Wt 179 lb 1.6 oz (81.2 kg)   SpO2 98%   BMI 28.05 kg/m   Wt Readings from Last 3 Encounters:  07/20/17 179 lb 1.6 oz (81.2 kg)  01/19/17 178 lb (80.7 kg)  01/12/17 173 lb (78.5 kg)    Physical Exam  Constitutional: He is oriented to person, place, and time. He appears well-developed and well-nourished.  HENT:  Head: Normocephalic and atraumatic.  Eyes: Conjunctivae and EOM are normal.  Neck: Normal range of motion.  Cardiovascular: Normal rate, regular rhythm and normal heart sounds.  Pulmonary/Chest: Effort normal and breath sounds normal.  Musculoskeletal: Normal range of motion.  Neurological: He is alert and oriented to person, place, and  time.  Skin: No erythema.  Psychiatric: He has a normal mood and affect. His behavior is normal. Judgment and thought content normal.    Results for orders placed or performed in visit on 01/19/17  Comprehensive metabolic panel  Result Value Ref Range   Glucose 79 65 - 99 mg/dL   BUN 12 8 - 27 mg/dL   Creatinine, Ser 0.74 (L) 0.76 - 1.27 mg/dL   GFR calc non Af Amer 97 >59 mL/min/1.73   GFR calc Af Amer 113 >59 mL/min/1.73   BUN/Creatinine Ratio 16 10 - 24   Sodium 144 134 - 144 mmol/L   Potassium 4.2 3.5 - 5.2 mmol/L   Chloride 105 96 - 106 mmol/L   CO2 24 20 - 29 mmol/L   Calcium 9.5 8.6 - 10.2 mg/dL   Total Protein 7.0 6.0 - 8.5 g/dL   Albumin 4.6 3.6 - 4.8 g/dL   Globulin, Total 2.4 1.5 - 4.5 g/dL   Albumin/Globulin Ratio 1.9 1.2 - 2.2   Bilirubin Total 0.5 0.0 - 1.2 mg/dL   Alkaline Phosphatase 59 39 - 117 IU/L   AST 24 0 - 40 IU/L   ALT 49 (H) 0 - 44 IU/L  Lipid panel  Result Value Ref Range   Cholesterol, Total 132 100 - 199 mg/dL   Triglycerides 174 (H) 0 - 149 mg/dL   HDL 32 (L) >39 mg/dL   VLDL Cholesterol Cal 35 5 - 40  mg/dL   LDL Calculated 65 0 - 99 mg/dL   Chol/HDL Ratio 4.1 0.0 - 5.0 ratio  CBC with Differential/Platelet  Result Value Ref Range   WBC 6.1 3.4 - 10.8 x10E3/uL   RBC 4.85 4.14 - 5.80 x10E6/uL   Hemoglobin 14.4 13.0 - 17.7 g/dL   Hematocrit 43.1 37.5 - 51.0 %   MCV 89 79 - 97 fL   MCH 29.7 26.6 - 33.0 pg   MCHC 33.4 31.5 - 35.7 g/dL   RDW 13.0 12.3 - 15.4 %   Platelets 270 150 - 379 x10E3/uL   Neutrophils 54 Not Estab. %   Lymphs 31 Not Estab. %   Monocytes 11 Not Estab. %   Eos 3 Not Estab. %   Basos 1 Not Estab. %   Neutrophils Absolute 3.4 1.4 - 7.0 x10E3/uL   Lymphocytes Absolute 1.9 0.7 - 3.1 x10E3/uL   Monocytes Absolute 0.7 0.1 - 0.9 x10E3/uL   EOS (ABSOLUTE) 0.2 0.0 - 0.4 x10E3/uL   Basophils Absolute 0.1 0.0 - 0.2 x10E3/uL   Immature Granulocytes 0 Not Estab. %   Immature Grans (Abs) 0.0 0.0 - 0.1 x10E3/uL  TSH  Result  Value Ref Range   TSH 3.170 0.450 - 4.500 uIU/mL  Urinalysis, Routine w reflex microscopic  Result Value Ref Range   Specific Gravity, UA 1.020 1.005 - 1.030   pH, UA 6.5 5.0 - 7.5   Color, UA Yellow Yellow   Appearance Ur Hazy (A) Clear   Leukocytes, UA Negative Negative   Protein, UA Negative Negative/Trace   Glucose, UA Negative Negative   Ketones, UA Negative Negative   RBC, UA Negative Negative   Bilirubin, UA Negative Negative   Urobilinogen, Ur 0.2 0.2 - 1.0 mg/dL   Nitrite, UA Negative Negative  PSA  Result Value Ref Range   Prostate Specific Ag, Serum 1.2 0.0 - 4.0 ng/mL      Assessment & Plan:   Problem List Items Addressed This Visit      Cardiovascular and Mediastinum   Hypertension - Primary    The current medical regimen is effective;  continue present plan and medications.       Relevant Orders   Basic metabolic panel   EKG 29-JMEQ (Completed)     Endocrine   Hypothyroid    The current medical regimen is effective;  continue present plan and medications.         Other   Chest pain    Discussed chest pain nonspecific but with risk factors of hypertension hypercholesterol family history age and sex will refer to cardiology.  Patient wants to see the cardiologist his father goes to at [facility] he will let us know the name of that person and we will go ahead with referral.      Relevant Orders   EKG 12-Lead (Completed)   Hypercholesteremia    No meds controled with diet          Follow up plan: Return in about 6 months (around 01/19/2018) for Physical Exam.

## 2017-07-20 NOTE — Assessment & Plan Note (Signed)
Discussed chest pain nonspecific but with risk factors of hypertension hypercholesterol family history age and sex will refer to cardiology.  Patient wants to see the cardiologist his father goes to at [facility] he will let us know the name of that person and we will go ahead with referral.

## 2017-07-20 NOTE — Assessment & Plan Note (Signed)
No meds controled with diet

## 2017-07-21 ENCOUNTER — Encounter: Payer: Self-pay | Admitting: Family Medicine

## 2017-07-21 LAB — BASIC METABOLIC PANEL
BUN/Creatinine Ratio: 11 (ref 10–24)
BUN: 11 mg/dL (ref 8–27)
CHLORIDE: 101 mmol/L (ref 96–106)
CO2: 22 mmol/L (ref 20–29)
CREATININE: 1 mg/dL (ref 0.76–1.27)
Calcium: 9.8 mg/dL (ref 8.6–10.2)
GFR calc Af Amer: 91 mL/min/{1.73_m2} (ref >59)
GFR calc non Af Amer: 79 mL/min/{1.73_m2} (ref >59)
GLUCOSE: 90 mg/dL (ref 65–99)
Potassium: 4.3 mmol/L (ref 3.5–5.2)
SODIUM: 139 mmol/L (ref 134–144)

## 2017-07-28 ENCOUNTER — Telehealth: Payer: Self-pay

## 2017-07-28 DIAGNOSIS — R079 Chest pain, unspecified: Secondary | ICD-10-CM

## 2017-07-28 NOTE — Telephone Encounter (Signed)
Copied from [place] [id number]. Topic: General - Other >> Jul 28, 2017  2:41 PM Carolyn Stare wrote:  Pt call today to say he saw the doctor on 07/20/17 was suppose to be getting a referral to see a cardiologist and has not heard from anyone.He said he told the office he would like to go to [facility]   Would like a call back   [phone]    Routing to provider as no referral was entered on that date.

## 2017-07-28 NOTE — Telephone Encounter (Signed)
Referral placed.  As per our note we were waiting on the patient to let us know where he wanted to go.

## 2017-07-29 NOTE — Telephone Encounter (Signed)
Patient notified

## 2017-08-09 NOTE — Telephone Encounter (Signed)
Referral faxed to [facility]. Called and apologized to the patient for this mistake and let him know that I have faxed his referral to [facility] for him,

## 2017-08-09 NOTE — Telephone Encounter (Signed)
See note. Please advise.  Thank you

## 2017-08-09 NOTE — Telephone Encounter (Signed)
Pt calling back and states that the referral needs to be resent. It was sent to [facility] and not [facility]. Pt needs referral sent to [facility]. He has an appt with them this Wednesday at 3:30pm.

## 2017-08-11 DIAGNOSIS — R079 Chest pain, unspecified: Secondary | ICD-10-CM | POA: Diagnosis not present

## 2017-08-11 DIAGNOSIS — I10 Essential (primary) hypertension: Secondary | ICD-10-CM | POA: Diagnosis not present

## 2017-08-24 DIAGNOSIS — I10 Essential (primary) hypertension: Secondary | ICD-10-CM | POA: Diagnosis not present

## 2017-08-24 DIAGNOSIS — R079 Chest pain, unspecified: Secondary | ICD-10-CM | POA: Diagnosis not present

## 2017-09-01 DIAGNOSIS — R079 Chest pain, unspecified: Secondary | ICD-10-CM | POA: Diagnosis not present

## 2017-09-01 DIAGNOSIS — I10 Essential (primary) hypertension: Secondary | ICD-10-CM | POA: Diagnosis not present

## 2017-10-25 ENCOUNTER — Telehealth: Payer: Self-pay | Admitting: Family Medicine

## 2017-10-25 DIAGNOSIS — E039 Hypothyroidism, unspecified: Secondary | ICD-10-CM

## 2017-10-25 DIAGNOSIS — I10 Essential (primary) hypertension: Secondary | ICD-10-CM

## 2017-10-25 MED ORDER — BENAZEPRIL HCL 40 MG PO TABS: 40.0000 mg | ORAL_TABLET | Freq: Every day | ORAL | 0 refills | Status: DC

## 2017-10-25 MED ORDER — LEVOTHYROXINE SODIUM 150 MCG PO TABS: 150.0000 ug | ORAL_TABLET | Freq: Every day | ORAL | 0 refills | Status: DC

## 2017-10-25 MED ORDER — OMEPRAZOLE 20 MG PO CPDR: 20.0000 mg | DELAYED_RELEASE_CAPSULE | Freq: Every day | ORAL | 0 refills | Status: DC

## 2017-10-25 MED ORDER — AMLODIPINE BESYLATE 5 MG PO TABS: 5.0000 mg | ORAL_TABLET | Freq: Every day | ORAL | 0 refills | Status: DC

## 2017-10-25 NOTE — Telephone Encounter (Signed)
Pt came in and would like all maintence medicines amlopidine 5mg , levoxthyrox 15 mg, and omeprazole dr 20 mg [facility] that [facility] needs [facility]

## 2017-10-25 NOTE — Addendum Note (Signed)
Addended by: Valerie Roys on: 10/25/2017 02:02 PM   Modules accepted: Orders

## 2017-10-25 NOTE — Telephone Encounter (Signed)
Rxs sent to his pharmacy.  

## 2017-11-26 DIAGNOSIS — E039 Hypothyroidism, unspecified: Secondary | ICD-10-CM | POA: Diagnosis not present

## 2017-11-26 DIAGNOSIS — I10 Essential (primary) hypertension: Secondary | ICD-10-CM | POA: Diagnosis not present

## 2017-11-26 DIAGNOSIS — Z0000 Encounter for general adult medical examination without abnormal findings: Secondary | ICD-10-CM | POA: Diagnosis not present

## 2017-11-26 DIAGNOSIS — K219 Gastro-esophageal reflux disease without esophagitis: Secondary | ICD-10-CM | POA: Diagnosis not present

## 2017-11-30 DIAGNOSIS — Z125 Encounter for screening for malignant neoplasm of prostate: Secondary | ICD-10-CM | POA: Diagnosis not present

## 2017-11-30 DIAGNOSIS — I10 Essential (primary) hypertension: Secondary | ICD-10-CM | POA: Diagnosis not present

## 2017-11-30 DIAGNOSIS — Z79899 Other long term (current) drug therapy: Secondary | ICD-10-CM | POA: Diagnosis not present

## 2017-11-30 DIAGNOSIS — E785 Hyperlipidemia, unspecified: Secondary | ICD-10-CM | POA: Diagnosis not present

## 2017-12-14 DIAGNOSIS — R1314 Dysphagia, pharyngoesophageal phase: Secondary | ICD-10-CM | POA: Diagnosis not present

## 2017-12-14 DIAGNOSIS — K219 Gastro-esophageal reflux disease without esophagitis: Secondary | ICD-10-CM | POA: Diagnosis not present

## 2017-12-14 DIAGNOSIS — Z86010 Personal history of colonic polyps: Secondary | ICD-10-CM | POA: Diagnosis not present

## 2017-12-19 ENCOUNTER — Other Ambulatory Visit: Payer: Self-pay | Admitting: Family Medicine

## 2017-12-19 DIAGNOSIS — E039 Hypothyroidism, unspecified: Secondary | ICD-10-CM

## 2017-12-19 DIAGNOSIS — I10 Essential (primary) hypertension: Secondary | ICD-10-CM

## 2017-12-20 NOTE — Telephone Encounter (Signed)
Patient called, left VM to return call to the office to schedule a physical with Dr. Jeananne Rama. Last OV noted return around 01/19/18 for physical.

## 2018-01-11 DIAGNOSIS — E039 Hypothyroidism, unspecified: Secondary | ICD-10-CM | POA: Diagnosis not present

## 2018-01-11 DIAGNOSIS — K219 Gastro-esophageal reflux disease without esophagitis: Secondary | ICD-10-CM | POA: Diagnosis not present

## 2018-01-11 DIAGNOSIS — Z23 Encounter for immunization: Secondary | ICD-10-CM | POA: Diagnosis not present

## 2018-01-11 DIAGNOSIS — I10 Essential (primary) hypertension: Secondary | ICD-10-CM | POA: Diagnosis not present

## 2018-01-11 DIAGNOSIS — E785 Hyperlipidemia, unspecified: Secondary | ICD-10-CM | POA: Diagnosis not present

## 2018-01-24 DIAGNOSIS — R1310 Dysphagia, unspecified: Secondary | ICD-10-CM | POA: Diagnosis not present

## 2018-01-24 DIAGNOSIS — K228 Other specified diseases of esophagus: Secondary | ICD-10-CM | POA: Diagnosis not present

## 2018-01-24 DIAGNOSIS — K210 Gastro-esophageal reflux disease with esophagitis: Secondary | ICD-10-CM | POA: Diagnosis not present

## 2018-01-24 DIAGNOSIS — K640 First degree hemorrhoids: Secondary | ICD-10-CM | POA: Diagnosis not present

## 2018-01-24 DIAGNOSIS — R1314 Dysphagia, pharyngoesophageal phase: Secondary | ICD-10-CM | POA: Diagnosis not present

## 2018-01-24 DIAGNOSIS — Z86010 Personal history of colonic polyps: Secondary | ICD-10-CM | POA: Diagnosis not present

## 2018-01-24 DIAGNOSIS — K219 Gastro-esophageal reflux disease without esophagitis: Secondary | ICD-10-CM | POA: Diagnosis not present

## 2018-03-14 ENCOUNTER — Other Ambulatory Visit: Payer: Self-pay | Admitting: Family Medicine

## 2018-03-14 DIAGNOSIS — I10 Essential (primary) hypertension: Secondary | ICD-10-CM

## 2018-03-15 NOTE — Telephone Encounter (Signed)
Refill request for amlodipine; refill note dated 12/20/17 reads, "Schedule physical before more refills"; no upcoming visits noted; left message on voicemail [phone]; will route to office for final disposition.  Requested medication (s) are due for refill today: yes  Requested medication (s) are on the active medication list: yes  Last refill:  12/20/17  Future visit scheduled: no  Notes to clinic:  Unable to schedule physical    Requested Prescriptions  Pending Prescriptions Disp Refills  . amLODipine (NORVASC) 5 MG tablet [Pharmacy Med Name: AMLODIPINE  5MG   TAB] 90 tablet 0    Sig: TAKE 1 TABLET BY MOUTH  DAILY     Cardiovascular:  Calcium Channel Blockers Failed - 03/14/2018  9:24 PM      Failed - Valid encounter within last 6 months    Recent Outpatient Visits          7 months ago Essential hypertension   [place], Jeannette How, MD   1 year ago Needs flu shot   [facility], Jeannette How, MD   1 year ago Essential hypertension   [place], Jeannette How, MD   2 years ago Essential hypertension   [place], Jeannette How, MD   2 years ago Essential hypertension   [place], Jeannette How, MD             Passed - Last BP in normal range    BP Readings from Last 1 Encounters:  07/20/17 120/78

## 2018-04-25 DIAGNOSIS — I10 Essential (primary) hypertension: Secondary | ICD-10-CM | POA: Diagnosis not present

## 2018-04-25 DIAGNOSIS — K219 Gastro-esophageal reflux disease without esophagitis: Secondary | ICD-10-CM | POA: Diagnosis not present

## 2018-04-25 DIAGNOSIS — E785 Hyperlipidemia, unspecified: Secondary | ICD-10-CM | POA: Diagnosis not present

## 2018-04-25 DIAGNOSIS — R1314 Dysphagia, pharyngoesophageal phase: Secondary | ICD-10-CM | POA: Diagnosis not present

## 2018-04-26 ENCOUNTER — Other Ambulatory Visit (HOSPITAL_COMMUNITY): Payer: Self-pay | Admitting: Gastroenterology

## 2018-04-26 ENCOUNTER — Other Ambulatory Visit: Payer: Self-pay | Admitting: Gastroenterology

## 2018-04-26 DIAGNOSIS — R1314 Dysphagia, pharyngoesophageal phase: Secondary | ICD-10-CM

## 2018-04-26 DIAGNOSIS — K219 Gastro-esophageal reflux disease without esophagitis: Secondary | ICD-10-CM

## 2018-05-03 ENCOUNTER — Other Ambulatory Visit: Payer: Self-pay

## 2018-05-03 ENCOUNTER — Ambulatory Visit
Admission: RE | Admit: 2018-05-03 | Discharge: 2018-05-03 | Disposition: A | Discharge status: Home / Self Care | Payer: BLUE CROSS/BLUE SHIELD | Source: Ambulatory Visit | Attending: Gastroenterology | Admitting: Gastroenterology

## 2018-05-03 ENCOUNTER — Other Ambulatory Visit: Payer: Self-pay | Admitting: Gastroenterology

## 2018-05-03 DIAGNOSIS — K222 Esophageal obstruction: Secondary | ICD-10-CM | POA: Diagnosis not present

## 2018-05-03 DIAGNOSIS — K219 Gastro-esophageal reflux disease without esophagitis: Secondary | ICD-10-CM | POA: Diagnosis not present

## 2018-05-03 DIAGNOSIS — R1314 Dysphagia, pharyngoesophageal phase: Secondary | ICD-10-CM

## 2018-06-08 ENCOUNTER — Other Ambulatory Visit: Payer: Self-pay | Admitting: Family Medicine

## 2018-06-08 DIAGNOSIS — I10 Essential (primary) hypertension: Secondary | ICD-10-CM

## 2018-06-09 NOTE — Telephone Encounter (Signed)
Can this patient have a refill.  Last visit with Dr. Jeananne Rama was 07/20/2017.  Please advise

## 2018-06-10 DIAGNOSIS — R509 Fever, unspecified: Secondary | ICD-10-CM | POA: Diagnosis not present

## 2018-06-23 DIAGNOSIS — Z79899 Other long term (current) drug therapy: Secondary | ICD-10-CM | POA: Diagnosis not present

## 2018-06-23 DIAGNOSIS — E785 Hyperlipidemia, unspecified: Secondary | ICD-10-CM | POA: Diagnosis not present

## 2018-06-24 ENCOUNTER — Other Ambulatory Visit: Payer: Self-pay | Admitting: Family Medicine

## 2018-06-24 DIAGNOSIS — I10 Essential (primary) hypertension: Secondary | ICD-10-CM

## 2018-06-24 NOTE — Telephone Encounter (Signed)
Can this patient receive a refill on this medication?

## 2018-06-28 DIAGNOSIS — E785 Hyperlipidemia, unspecified: Secondary | ICD-10-CM | POA: Diagnosis not present

## 2018-06-28 DIAGNOSIS — E039 Hypothyroidism, unspecified: Secondary | ICD-10-CM | POA: Diagnosis not present

## 2018-06-28 DIAGNOSIS — I10 Essential (primary) hypertension: Secondary | ICD-10-CM | POA: Diagnosis not present

## 2018-09-22 DIAGNOSIS — Z20828 Contact with and (suspected) exposure to other viral communicable diseases: Secondary | ICD-10-CM | POA: Diagnosis not present

## 2018-09-27 DIAGNOSIS — E039 Hypothyroidism, unspecified: Secondary | ICD-10-CM | POA: Diagnosis not present

## 2018-10-04 DIAGNOSIS — Z125 Encounter for screening for malignant neoplasm of prostate: Secondary | ICD-10-CM | POA: Diagnosis not present

## 2018-10-04 DIAGNOSIS — E039 Hypothyroidism, unspecified: Secondary | ICD-10-CM | POA: Diagnosis not present

## 2018-10-04 DIAGNOSIS — Z79899 Other long term (current) drug therapy: Secondary | ICD-10-CM | POA: Diagnosis not present

## 2018-10-04 DIAGNOSIS — E785 Hyperlipidemia, unspecified: Secondary | ICD-10-CM | POA: Diagnosis not present

## 2018-10-04 DIAGNOSIS — K222 Esophageal obstruction: Secondary | ICD-10-CM | POA: Diagnosis not present

## 2018-10-04 DIAGNOSIS — I10 Essential (primary) hypertension: Secondary | ICD-10-CM | POA: Diagnosis not present

## 2018-10-10 DIAGNOSIS — E785 Hyperlipidemia, unspecified: Secondary | ICD-10-CM | POA: Diagnosis not present

## 2018-10-10 DIAGNOSIS — E039 Hypothyroidism, unspecified: Secondary | ICD-10-CM | POA: Diagnosis not present

## 2018-10-10 DIAGNOSIS — R1314 Dysphagia, pharyngoesophageal phase: Secondary | ICD-10-CM | POA: Diagnosis not present

## 2018-10-10 DIAGNOSIS — K222 Esophageal obstruction: Secondary | ICD-10-CM | POA: Diagnosis not present

## 2018-10-10 DIAGNOSIS — Z86010 Personal history of colonic polyps: Secondary | ICD-10-CM | POA: Diagnosis not present

## 2018-10-10 DIAGNOSIS — I10 Essential (primary) hypertension: Secondary | ICD-10-CM | POA: Diagnosis not present

## 2018-10-10 DIAGNOSIS — K219 Gastro-esophageal reflux disease without esophagitis: Secondary | ICD-10-CM | POA: Diagnosis not present

## 2018-10-10 DIAGNOSIS — K224 Dyskinesia of esophagus: Secondary | ICD-10-CM | POA: Diagnosis not present

## 2018-10-10 DIAGNOSIS — R072 Precordial pain: Secondary | ICD-10-CM | POA: Diagnosis not present

## 2018-10-28 ENCOUNTER — Other Ambulatory Visit
Admission: RE | Admit: 2018-10-28 | Discharge: 2018-10-28 | Disposition: A | Discharge status: Home / Self Care | Payer: PPO | Source: Ambulatory Visit | Attending: Internal Medicine | Admitting: Internal Medicine

## 2018-10-28 ENCOUNTER — Other Ambulatory Visit: Payer: Self-pay

## 2018-10-28 DIAGNOSIS — Z01812 Encounter for preprocedural laboratory examination: Secondary | ICD-10-CM | POA: Diagnosis not present

## 2018-10-28 DIAGNOSIS — Z20828 Contact with and (suspected) exposure to other viral communicable diseases: Secondary | ICD-10-CM | POA: Diagnosis not present

## 2018-10-29 LAB — SARS CORONAVIRUS 2 (TAT 6-24 HRS): SARS Coronavirus 2: NEGATIVE

## 2018-10-31 ENCOUNTER — Other Ambulatory Visit: Payer: Self-pay | Admitting: Family Medicine

## 2018-11-01 ENCOUNTER — Other Ambulatory Visit: Payer: Self-pay

## 2018-11-01 ENCOUNTER — Ambulatory Visit: Payer: PPO | Admitting: Anesthesiology

## 2018-11-01 ENCOUNTER — Ambulatory Visit
Admission: RE | Admit: 2018-11-01 | Discharge: 2018-11-01 | Disposition: A | Discharge status: Home / Self Care | Payer: PPO | Attending: Internal Medicine | Admitting: Internal Medicine

## 2018-11-01 ENCOUNTER — Encounter
Admission: RE | Disposition: A | Discharge status: Home / Self Care | Payer: Self-pay | Source: Home / Self Care | Attending: Internal Medicine

## 2018-11-01 DIAGNOSIS — Z7982 Long term (current) use of aspirin: Secondary | ICD-10-CM | POA: Insufficient documentation

## 2018-11-01 DIAGNOSIS — Z79899 Other long term (current) drug therapy: Secondary | ICD-10-CM | POA: Diagnosis not present

## 2018-11-01 DIAGNOSIS — R1310 Dysphagia, unspecified: Secondary | ICD-10-CM | POA: Diagnosis not present

## 2018-11-01 DIAGNOSIS — R933 Abnormal findings on diagnostic imaging of other parts of digestive tract: Secondary | ICD-10-CM | POA: Insufficient documentation

## 2018-11-01 DIAGNOSIS — K222 Esophageal obstruction: Secondary | ICD-10-CM | POA: Insufficient documentation

## 2018-11-01 DIAGNOSIS — Z79890 Hormone replacement therapy (postmenopausal): Secondary | ICD-10-CM | POA: Insufficient documentation

## 2018-11-01 DIAGNOSIS — I10 Essential (primary) hypertension: Secondary | ICD-10-CM | POA: Diagnosis not present

## 2018-11-01 DIAGNOSIS — K228 Other specified diseases of esophagus: Secondary | ICD-10-CM | POA: Diagnosis not present

## 2018-11-01 DIAGNOSIS — E039 Hypothyroidism, unspecified: Secondary | ICD-10-CM | POA: Insufficient documentation

## 2018-11-01 DIAGNOSIS — R079 Chest pain, unspecified: Secondary | ICD-10-CM | POA: Diagnosis not present

## 2018-11-01 DIAGNOSIS — E7800 Pure hypercholesterolemia, unspecified: Secondary | ICD-10-CM | POA: Diagnosis not present

## 2018-11-01 HISTORY — DX: Hypothyroidism, unspecified: E03.9

## 2018-11-01 HISTORY — PX: ESOPHAGOGASTRODUODENOSCOPY (EGD) WITH PROPOFOL: SHX5813

## 2018-11-01 HISTORY — DX: Dysphagia, unspecified: R13.10

## 2018-11-01 SURGERY — ESOPHAGOGASTRODUODENOSCOPY (EGD) WITH PROPOFOL
Anesthesia: General

## 2018-11-01 MED ORDER — PROPOFOL 10 MG/ML IV BOLUS
INTRAVENOUS | Status: DC | PRN
  Administered 2018-11-01: 70 mg via INTRAVENOUS

## 2018-11-01 MED ORDER — PROPOFOL 500 MG/50ML IV EMUL
INTRAVENOUS | Status: AC
  Filled 2018-11-01: qty 50

## 2018-11-01 MED ORDER — PROPOFOL 500 MG/50ML IV EMUL
INTRAVENOUS | Status: DC | PRN
  Administered 2018-11-01: 175 ug/kg/min via INTRAVENOUS

## 2018-11-01 MED ORDER — SODIUM CHLORIDE 0.9 % IV SOLN
INTRAVENOUS | Status: DC
  Administered 2018-11-01: 09:00:00 via INTRAVENOUS

## 2018-11-01 NOTE — Anesthesia Preprocedure Evaluation (Signed)
Anesthesia Evaluation  Patient identified by MRN, date of birth, ID band Patient awake    Reviewed: Allergy & Precautions, H&P , NPO status , Patient's Chart, lab work & pertinent test results, reviewed documented beta blocker date and time   Airway Mallampati: II   Neck ROM: full    Dental  (+) Poor Dentition   Pulmonary neg pulmonary ROS,    Pulmonary exam normal        Cardiovascular Exercise Tolerance: Good hypertension, On Medications negative cardio ROS Normal cardiovascular exam Rhythm:regular Rate:Normal     Neuro/Psych negative neurological ROS  negative psych ROS   GI/Hepatic negative GI ROS, Neg liver ROS,   Endo/Other  Hypothyroidism   Renal/GU negative Renal ROS  negative genitourinary   Musculoskeletal   Abdominal   Peds  Hematology negative hematology ROS (+)   Anesthesia Other Findings Past Medical History: No date: Dysphagia No date: Hypertension No date: Hypothyroidism Past Surgical History: 2004: COLONOSCOPY 01/26/13: HERNIA REPAIR     Comment:  right inguinal hernia and umbilical hernia No date: UPPER GI ENDOSCOPY   Reproductive/Obstetrics negative OB ROS                             Anesthesia Physical Anesthesia Plan  ASA: III  Anesthesia Plan: General   Post-op Pain Management:    Induction:   PONV Risk Score and Plan:   Airway Management Planned:   Additional Equipment:   Intra-op Plan:   Post-operative Plan:   Informed Consent: I have reviewed the patients History and Physical, chart, labs and discussed the procedure including the risks, benefits and alternatives for the proposed anesthesia with the patient or authorized representative who has indicated his/her understanding and acceptance.     Dental Advisory Given  Plan Discussed with: CRNA  Anesthesia Plan Comments:         Anesthesia Quick Evaluation

## 2018-11-01 NOTE — Transfer of Care (Signed)
Immediate Anesthesia Transfer of Care Note  Patient: Frank Williamson  Procedure(s) Performed: ESOPHAGOGASTRODUODENOSCOPY (EGD) WITH PROPOFOL (N/A )  Patient Location: PACU  Anesthesia Type:General  Level of Consciousness: awake and alert   Airway & Oxygen Therapy: Patient Spontanous Breathing and Patient connected to nasal cannula oxygen  Post-op Assessment: Report given to RN and Post -op Vital signs reviewed and stable  Post vital signs: Reviewed and stable  Last Vitals:  Vitals Value Taken Time  BP 121/70 11/01/18 0954  Temp    Pulse 73 11/01/18 0955  Resp 14 11/01/18 0955  SpO2 100 % 11/01/18 0955  Vitals shown include unvalidated device data.  Last Pain:  Vitals:   11/01/18 0954  TempSrc:   PainSc: Asleep         Complications: No apparent anesthesia complications

## 2018-11-01 NOTE — Interval H&P Note (Signed)
History and Physical Interval Note:  11/01/2018 9:00 AM  Frank Williamson  has presented today for surgery, with the diagnosis of chest pain, dysphagia, dysmotility.  The various methods of treatment have been discussed with the patient and family. After consideration of risks, benefits and other options for treatment, the patient has consented to  Procedure(s): ESOPHAGOGASTRODUODENOSCOPY (EGD) WITH PROPOFOL (N/A) as a surgical intervention.  The patient's history has been reviewed, patient examined, no change in status, stable for surgery.  I have reviewed the patient's chart and labs.  Questions were answered to the patient's satisfaction.     [place], [place]

## 2018-11-01 NOTE — Anesthesia Post-op Follow-up Note (Signed)
Anesthesia QCDR form completed.        

## 2018-11-01 NOTE — Op Note (Signed)
[facility] Patient Name: Frank Williamson Procedure Date: 11/01/2018 9:34 AM MRN: [id number] Account #: [id number] Date of Birth: 1951/01/08 Admit Type: Outpatient Age: 68 Room: [facility] ENDO ROOM 2 Gender: Male Note Status: Finalized Procedure:            Upper GI endoscopy Indications:          Dysphagia, Abnormal UGI series Providers:            Benay Pike. Alice Reichert MD, MD Referring MD:         Leonie Douglas. Doy Hutching, MD (Referring MD) Medicines:            Propofol per Anesthesia Complications:        No immediate complications. Procedure:            Pre-Anesthesia Assessment:                       - The risks and benefits of the procedure and the                        sedation options and risks were discussed with the                        patient. All questions were answered and informed                        consent was obtained.                       - Patient identification and proposed procedure were                        verified prior to the procedure by the nurse. The                        procedure was verified in the procedure room.                       - ASA Grade Assessment: II - A patient with mild                        systemic disease.                       - After reviewing the risks and benefits, the patient                        was deemed in satisfactory condition to undergo the                        procedure.                       After obtaining informed consent, the endoscope was                        passed under direct vision. Throughout the procedure,                        the patient's blood pressure, pulse, and oxygen  saturations were monitored continuously. The Endoscope                        was introduced through the mouth, and advanced to the                        third part of duodenum. The upper GI endoscopy was                        somewhat difficult due to stenosis. Successful                       completion of the procedure was aided by withdrawing                        and reinserting the scope. The patient tolerated the                        procedure well. Findings:      No appreciable esophageal motility was noted. 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 There was moderate resistance to       endoscope advancement into the stomach. The Z-line was regular. The       gastroesophageal junction was normal on retroflexed view. The scope was       withdrawn. Dilation was performed with a Maloney dilator with no       resistance at 60 Fr.      The entire examined stomach was normal.      The examined duodenum was normal.      Fluid was found in the middle third of the esophagus and in the lower       third of the esophagus. Fluid aspiration for esophageal clearance was       performed. 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 Dilated.                       - Normal stomach.                       - Normal examined duodenum.                       - No specimens collected. Recommendation:       - Patient has a contact number available for                        emergencies. The signs and symptoms of potential                        delayed complications were discussed with the patient.                        Return to normal activities tomorrow. Written discharge                        instructions were provided to the patient.                       - Advance diet as tolerated.                       -  Perform ambulatory esophageal manometry in 2 weeks.                       - The findings and recommendations were discussed with                        the patient.                       - Continue present medications. Procedure Code(s):    --- Professional ---                       [id number], Esophagogastroduodenoscopy, flexible, transoral;                        diagnostic, including collection of specimen(s)  by                        brushing or washing, when performed (separate procedure)                       43450, Dilation of esophagus, by unguided sound or                        bougie, single or multiple passes Diagnosis Code(s):    --- Professional ---                       R13.10, Dysphagia, unspecified                       R93.3, Abnormal findings on diagnostic imaging of other                        parts of digestive tract CPT copyright 2019 American Medical Association. All rights reserved. The codes documented in this report are preliminary and upon coder review may  be revised to meet current compliance requirements. Efrain Sella MD, MD 11/01/2018 9:58:06 AM This report has been signed electronically. Number of Addenda: 0 Note Initiated On: 11/01/2018 9:34 AM Estimated Blood Loss: Estimated blood loss: none. Estimated blood loss: none.      [facility]

## 2018-11-01 NOTE — H&P (Signed)
Outpatient short stay form Pre-procedure 11/01/2018 8:58 AM Frank Williamson K. Alice Reichert, M.D.  Primary Physician: Fulton Reek, M.D.  Reason for visit:  Dysphagia, abnormal barium swallow.  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 Last EGD in 01/2018 showed stricture that was dilated.    Current Facility-Administered Medications:  .  0.9 %  sodium chloride infusion, , Intravenous, Continuous, Mairen Wallenstein, Benay Pike, MD  Medications Prior to Admission  Medication Sig Dispense Refill Last Dose  . benazepril (LOTENSIN) 40 MG tablet Take 1 tablet (40 mg total) by mouth daily. Schedule physical before more refills 90 tablet 0 11/01/2018 at Unknown time  . levothyroxine (SYNTHROID, LEVOTHROID) 150 MCG tablet Take 1 tablet (150 mcg total) by mouth daily before breakfast. Schedule physical before more refills 90 tablet 0 10/31/2018 at Unknown time  . Multiple Vitamin (MULTIVITAMIN) tablet Take 1 tablet by mouth daily.   Past Month at Unknown time  . omeprazole (PRILOSEC) 40 MG capsule Take 40 mg by mouth 2 (two) times daily.     . Saw Palmetto 450 MG CAPS Take 2 capsules by mouth daily.   Past Month at Unknown time  . sildenafil (REVATIO) 20 MG tablet Take 1 tablet (20 mg total) by mouth 1 day or 1 dose. 1-5 prn 90 tablet 4 Past Week at Unknown time  . amLODipine (NORVASC) 5 MG tablet TAKE 1 TABLET BY MOUTH  DAILY 90 tablet 1   . aspirin 81 MG tablet Take 81 mg by mouth daily.     . fluticasone (FLONASE) 50 MCG/ACT nasal spray Place 2 sprays into both nostrils daily. 16 g 12   . omeprazole (PRILOSEC) 20 MG capsule Take 1 capsule (20 mg total) by mouth daily. 90 capsule 0      No Known Allergies   Past Medical History:  Diagnosis Date  . Dysphagia   . Hypertension   . Hypothyroidism     Review of systems:  Otherwise negative.    Physical Exam  Gen: Alert, oriented. Appears  stated age.  HEENT: [place]/AT. PERRLA. Lungs: CTA, no wheezes. CV: RR nl S1, S2. Abd: soft, benign, no masses. BS+ Ext: No edema. Pulses 2+    Planned procedures: Proceed with EGD. The patient understands the nature of the planned procedure, indications, risks, alternatives and potential complications including but not limited to bleeding, infection, perforation, damage to internal organs and possible oversedation/side effects from anesthesia. The patient agrees and gives consent to proceed.  Please refer to procedure notes for findings, recommendations and patient disposition/instructions.     Lanard Arguijo K. Alice Reichert, M.D. Gastroenterology 11/01/2018  8:58 AM

## 2018-11-02 ENCOUNTER — Encounter: Payer: Self-pay | Admitting: Internal Medicine

## 2018-11-08 NOTE — Anesthesia Postprocedure Evaluation (Signed)
Anesthesia Post Note  Patient: Frank Williamson  Procedure(s) Performed: ESOPHAGOGASTRODUODENOSCOPY (EGD) WITH PROPOFOL (N/A )  Patient location during evaluation: PACU Anesthesia Type: General Level of consciousness: awake and alert Pain management: pain level controlled Vital Signs Assessment: post-procedure vital signs reviewed and stable Respiratory status: spontaneous breathing, nonlabored ventilation, respiratory function stable and patient connected to nasal cannula oxygen Cardiovascular status: blood pressure returned to baseline and stable Postop Assessment: no apparent nausea or vomiting Anesthetic complications: no     Last Vitals:  Vitals:   11/01/18 1014 11/01/18 1024  BP: 120/85 123/85  Pulse: 72 71  Resp: 17 17  Temp:    SpO2: 100% 100%    Last Pain:  Vitals:   11/01/18 1024  TempSrc:   PainSc: 0-No pain                 Molli Barrows

## 2018-12-15 DIAGNOSIS — K5904 Chronic idiopathic constipation: Secondary | ICD-10-CM | POA: Diagnosis not present

## 2018-12-15 DIAGNOSIS — R1310 Dysphagia, unspecified: Secondary | ICD-10-CM | POA: Diagnosis not present

## 2018-12-26 DIAGNOSIS — R1310 Dysphagia, unspecified: Secondary | ICD-10-CM | POA: Diagnosis not present

## 2018-12-27 DIAGNOSIS — D225 Melanocytic nevi of trunk: Secondary | ICD-10-CM | POA: Diagnosis not present

## 2018-12-27 DIAGNOSIS — B079 Viral wart, unspecified: Secondary | ICD-10-CM | POA: Diagnosis not present

## 2018-12-27 DIAGNOSIS — L719 Rosacea, unspecified: Secondary | ICD-10-CM | POA: Diagnosis not present

## 2018-12-27 DIAGNOSIS — D485 Neoplasm of uncertain behavior of skin: Secondary | ICD-10-CM | POA: Diagnosis not present

## 2018-12-27 DIAGNOSIS — Z1283 Encounter for screening for malignant neoplasm of skin: Secondary | ICD-10-CM | POA: Diagnosis not present

## 2018-12-27 DIAGNOSIS — L814 Other melanin hyperpigmentation: Secondary | ICD-10-CM | POA: Diagnosis not present

## 2018-12-28 DIAGNOSIS — Z79899 Other long term (current) drug therapy: Secondary | ICD-10-CM | POA: Diagnosis not present

## 2018-12-28 DIAGNOSIS — Z125 Encounter for screening for malignant neoplasm of prostate: Secondary | ICD-10-CM | POA: Diagnosis not present

## 2018-12-28 DIAGNOSIS — I10 Essential (primary) hypertension: Secondary | ICD-10-CM | POA: Diagnosis not present

## 2018-12-28 DIAGNOSIS — E039 Hypothyroidism, unspecified: Secondary | ICD-10-CM | POA: Diagnosis not present

## 2018-12-28 DIAGNOSIS — E785 Hyperlipidemia, unspecified: Secondary | ICD-10-CM | POA: Diagnosis not present

## 2019-01-04 DIAGNOSIS — K219 Gastro-esophageal reflux disease without esophagitis: Secondary | ICD-10-CM | POA: Diagnosis not present

## 2019-01-04 DIAGNOSIS — I10 Essential (primary) hypertension: Secondary | ICD-10-CM | POA: Diagnosis not present

## 2019-01-04 DIAGNOSIS — Z79899 Other long term (current) drug therapy: Secondary | ICD-10-CM | POA: Diagnosis not present

## 2019-01-04 DIAGNOSIS — E785 Hyperlipidemia, unspecified: Secondary | ICD-10-CM | POA: Diagnosis not present

## 2019-01-04 DIAGNOSIS — E039 Hypothyroidism, unspecified: Secondary | ICD-10-CM | POA: Diagnosis not present

## 2019-03-10 DIAGNOSIS — K219 Gastro-esophageal reflux disease without esophagitis: Secondary | ICD-10-CM | POA: Diagnosis not present

## 2019-03-10 DIAGNOSIS — I10 Essential (primary) hypertension: Secondary | ICD-10-CM | POA: Diagnosis not present

## 2019-03-10 DIAGNOSIS — E039 Hypothyroidism, unspecified: Secondary | ICD-10-CM | POA: Diagnosis not present

## 2019-03-10 DIAGNOSIS — K220 Achalasia of cardia: Secondary | ICD-10-CM | POA: Diagnosis not present

## 2019-03-10 DIAGNOSIS — Z23 Encounter for immunization: Secondary | ICD-10-CM | POA: Diagnosis not present

## 2019-03-10 DIAGNOSIS — Z01818 Encounter for other preprocedural examination: Secondary | ICD-10-CM | POA: Diagnosis not present

## 2019-03-18 DIAGNOSIS — E039 Hypothyroidism, unspecified: Secondary | ICD-10-CM | POA: Diagnosis not present

## 2019-03-18 DIAGNOSIS — Z7982 Long term (current) use of aspirin: Secondary | ICD-10-CM | POA: Diagnosis not present

## 2019-03-18 DIAGNOSIS — K219 Gastro-esophageal reflux disease without esophagitis: Secondary | ICD-10-CM | POA: Diagnosis not present

## 2019-03-18 DIAGNOSIS — Z20822 Contact with and (suspected) exposure to covid-19: Secondary | ICD-10-CM | POA: Diagnosis not present

## 2019-03-18 DIAGNOSIS — E785 Hyperlipidemia, unspecified: Secondary | ICD-10-CM | POA: Diagnosis not present

## 2019-03-18 DIAGNOSIS — J982 Interstitial emphysema: Secondary | ICD-10-CM | POA: Diagnosis not present

## 2019-03-18 DIAGNOSIS — R338 Other retention of urine: Secondary | ICD-10-CM | POA: Diagnosis not present

## 2019-03-18 DIAGNOSIS — F40240 Claustrophobia: Secondary | ICD-10-CM | POA: Diagnosis not present

## 2019-03-18 DIAGNOSIS — R918 Other nonspecific abnormal finding of lung field: Secondary | ICD-10-CM | POA: Diagnosis not present

## 2019-03-18 DIAGNOSIS — K220 Achalasia of cardia: Secondary | ICD-10-CM | POA: Diagnosis not present

## 2019-03-18 DIAGNOSIS — Z79899 Other long term (current) drug therapy: Secondary | ICD-10-CM | POA: Diagnosis not present

## 2019-03-18 DIAGNOSIS — Z01818 Encounter for other preprocedural examination: Secondary | ICD-10-CM | POA: Diagnosis not present

## 2019-03-18 DIAGNOSIS — R6881 Early satiety: Secondary | ICD-10-CM | POA: Diagnosis not present

## 2019-04-10 DIAGNOSIS — E039 Hypothyroidism, unspecified: Secondary | ICD-10-CM | POA: Diagnosis not present

## 2019-04-10 DIAGNOSIS — Z79899 Other long term (current) drug therapy: Secondary | ICD-10-CM | POA: Diagnosis not present

## 2019-04-10 DIAGNOSIS — E785 Hyperlipidemia, unspecified: Secondary | ICD-10-CM | POA: Diagnosis not present

## 2019-04-13 DIAGNOSIS — E785 Hyperlipidemia, unspecified: Secondary | ICD-10-CM | POA: Diagnosis not present

## 2019-04-13 DIAGNOSIS — Z9889 Other specified postprocedural states: Secondary | ICD-10-CM | POA: Diagnosis not present

## 2019-04-13 DIAGNOSIS — E039 Hypothyroidism, unspecified: Secondary | ICD-10-CM | POA: Diagnosis not present

## 2019-07-10 DIAGNOSIS — E785 Hyperlipidemia, unspecified: Secondary | ICD-10-CM | POA: Diagnosis not present

## 2019-07-10 DIAGNOSIS — I10 Essential (primary) hypertension: Secondary | ICD-10-CM | POA: Diagnosis not present

## 2019-07-10 DIAGNOSIS — E039 Hypothyroidism, unspecified: Secondary | ICD-10-CM | POA: Diagnosis not present

## 2019-07-10 DIAGNOSIS — Z0000 Encounter for general adult medical examination without abnormal findings: Secondary | ICD-10-CM | POA: Diagnosis not present

## 2019-07-10 DIAGNOSIS — K4090 Unilateral inguinal hernia, without obstruction or gangrene, not specified as recurrent: Secondary | ICD-10-CM | POA: Diagnosis not present

## 2019-07-10 DIAGNOSIS — Z79899 Other long term (current) drug therapy: Secondary | ICD-10-CM | POA: Diagnosis not present

## 2019-07-25 ENCOUNTER — Ambulatory Visit: Payer: Self-pay | Admitting: Surgery

## 2019-07-25 DIAGNOSIS — K4091 Unilateral inguinal hernia, without obstruction or gangrene, recurrent: Secondary | ICD-10-CM | POA: Diagnosis not present

## 2019-07-25 NOTE — H&P (Signed)
CC: Unilateral recurrent inguinal hernia without obstruction or gangrene [K40.91]  HPI:  Frank Williamson is a 69 y.o. male who was referred by Idelle Crouch, MD for evaluation of above. Symptoms were first noted several years ago. Pressure in area.  Associated with nothing specific, exacerbated by heavy lifting.  Lump is reducible.    Past Medical History:  has a past medical history of GERD (gastroesophageal reflux disease), Hypertension, Motion sickness, and Thyroid disease.  Past Surgical History:  Past Surgical History:  Procedure Laterality Date  . COLONOSCOPY  01/24/2018   Normal colon/PHx CP/Repeat 67yrs/TKT  . EGD  01/24/2018   Gastroesophageal reflux/No Repeat/TKT  . EGD  11/01/2018   Esophagus dilated/Otherwise nomral EGD/No Repeat at this time/TKT  . EPIGASTRIC HERNIA REPAIR    . ESOPHAGOSCOPY N/A 03/20/2019   Procedure: ESOPHAGOSCOPY, FLEXIBLE, TRANSORAL; DIAGNOSTIC, INCLUDING COLLECTION OF SPECIMEN(S) BY BRUSHING OR WASHING, WHEN PERFORMED (SEPARATE PROCEDURE);  Surgeon: Loma Sender, MD;  Location: [facility];  Service: Cardiothoracic;  Laterality: N/A;    Family History: family history includes Heart failure in his father; Myocardial Infarction (Heart attack) in his father.  Social History:  reports that he has never smoked. He has never used smokeless tobacco. He reports current alcohol use of about 2.0 standard drinks of alcohol per week. He reports that he does not use drugs.  Current Medications: has a current medication list which includes the following prescription(s): aspirin, benazepril, fluticasone propionate, levothyroxine, omeprazole, and sildenafil.  Allergies:  Allergies as of 07/25/2019 - Reviewed 07/25/2019  Allergen Reaction Noted  . Pm pain relief [diphenhydramine-acetaminophen] Hallucination 03/10/2019    ROS:  A 15 point review of systems was performed and pertinent positives and negatives noted in HPI   Objective:     BP  132/88   Pulse 84   Ht 170.2 cm (5\' 7" )   Wt 73.9 kg (163 lb)   BMI 25.53 kg/m   Constitutional :  alert, appears stated age, cooperative and no distress  Lymphatics/Throat:  no asymmetry, masses, or scars  Respiratory:  clear to auscultation bilaterally  Cardiovascular:  regular rate and rhythm  Gastrointestinal: soft, non-tender; bowel sounds normal; no masses,  no organomegaly. inguinal hernia noted.  small, reducible, no overlying skin changes and RIGHT  Musculoskeletal: Steady gait and movement  Skin: Cool and moist  Psychiatric: Normal affect, non-agitated, not confused       LABS:  n/a   RADS: n/a Assessment:       Unilateral recurrent inguinal hernia without obstruction or gangrene [K40.91], right  Plan:     1. Unilateral recurrent inguinal hernia without obstruction or gangrene [K40.91]   Discussed the risk of surgery including recurrence, which can be up to 50% in the case of incisional or complex hernias, possible use of prosthetic materials (mesh) and the increased risk of mesh infxn if used, bleeding, chronic pain, post-op infxn, post-op SBO or ileus, and possible re-operation to address said risks. The risks of general anesthetic, if used, includes MI, CVA, sudden death or even reaction to anesthetic medications also discussed. Alternatives include continued observation.  Benefits include possible symptom relief, prevention of incarceration, strangulation, enlargement in size over time, and the risk of emergency surgery in the face of strangulation.   Typical post-op recovery time of 3-5 days with 2 weeks of activity restrictions were also discussed.  ED return precautions given for sudden increase in pain, size of hernia with accompanying fever, nausea, and/or vomiting.  The patient verbalized understanding and all  questions were answered to the patient's satisfaction.   2. Patient has elected to proceed with surgical treatment. Procedure will be scheduled.   Written consent was obtained.Marland Kitchen RIGHT, OPEN due to recurrent nature and previous repair done laparoscopically.

## 2019-10-24 DIAGNOSIS — L255 Unspecified contact dermatitis due to plants, except food: Secondary | ICD-10-CM | POA: Diagnosis not present

## 2019-11-06 DIAGNOSIS — L237 Allergic contact dermatitis due to plants, except food: Secondary | ICD-10-CM | POA: Diagnosis not present

## 2019-11-23 ENCOUNTER — Ambulatory Visit: Payer: PPO | Admitting: Dermatology

## 2019-11-23 ENCOUNTER — Other Ambulatory Visit: Payer: Self-pay

## 2019-11-23 DIAGNOSIS — M542 Cervicalgia: Secondary | ICD-10-CM | POA: Diagnosis not present

## 2019-11-23 DIAGNOSIS — B029 Zoster without complications: Secondary | ICD-10-CM | POA: Diagnosis not present

## 2019-11-23 MED ORDER — VALACYCLOVIR HCL 1 G PO TABS: 1000.0000 mg | ORAL_TABLET | Freq: Three times a day (TID) | ORAL | 0 refills | Status: AC

## 2019-11-23 NOTE — Progress Notes (Signed)
   Follow-Up Visit   Subjective  Frank Williamson is a 69 y.o. male who presents for the following: shingles? (R post neck x 1 day, R arm x 2 days, R groin few days, itchy, painful, pt had hx of poison ivy ~20m ago had 2 rounds of oral prednisone 6 day tapers, and 1 kenalog im injection).  The following portions of the chart were reviewed this encounter and updated as appropriate:  Tobacco  Allergies  Meds  Problems  Med Hx  Surg Hx  Fam Hx     Review of Systems:  No other skin or systemic complaints except as noted in HPI or Assessment and Plan.  Objective  Well appearing patient in no apparent distress; mood and affect are within normal limits.  A focused examination was performed including neck, arm, groin. Relevant physical exam findings are noted in the Assessment and Plan.  Objective  Neck - Posterior: Linear vesicles R post shoulder neck, R arm, Involvement low back sacral, R groin    Images         Assessment & Plan  Herpes zoster with pain.  Possibly triggered by immunosuppression from recent systemic steroid treatment for poison ivy Neck - Posterior Discussed shingles and risk of long term pain  Start Valtrex 1g, 1 po tid for 14 days Start Motrin for pain, if worsens discussed starting Gabapentin.  Pt does have gabapentin at home so advised he may start that if needed. Advised to take Gabapentin hs and may make drowsy.  Discussed viral etiology (herpes zoster) and risk of contagion (low). Before blisters dry up and heal, shingles rash can cause chickenpox in people who have never had it or have never been vaccinated against it, if they are exposed to the virus.  Keep area covered.  Avoid contact with pregnant women. Shingles can also cause significant pain or unusual skin sensations (post-herpetic neuralgia) which may persist after rash has resolved.  Shingles rash may leave dyspigmented scars on skin once healed.   valACYclovir (VALTREX) 1000 MG tablet -  Neck - Posterior  Return for As scheduled for [facility].   I, Othelia Pulling, RMA, am acting as scribe for Sarina Ser, MD .  Documentation: I have reviewed the above documentation for accuracy and completeness, and I agree with the above.  Sarina Ser, MD

## 2019-11-24 ENCOUNTER — Encounter: Payer: Self-pay | Admitting: Dermatology

## 2019-12-05 ENCOUNTER — Other Ambulatory Visit: Payer: Self-pay

## 2019-12-05 ENCOUNTER — Ambulatory Visit: Payer: PPO | Admitting: Dermatology

## 2019-12-05 DIAGNOSIS — R21 Rash and other nonspecific skin eruption: Secondary | ICD-10-CM | POA: Diagnosis not present

## 2019-12-05 DIAGNOSIS — D485 Neoplasm of uncertain behavior of skin: Secondary | ICD-10-CM

## 2019-12-05 DIAGNOSIS — B078 Other viral warts: Secondary | ICD-10-CM | POA: Diagnosis not present

## 2019-12-05 DIAGNOSIS — B029 Zoster without complications: Secondary | ICD-10-CM | POA: Diagnosis not present

## 2019-12-05 MED ORDER — CLOBETASOL PROPIONATE 0.05 % EX SOLN: CUTANEOUS | 1 refills | Status: DC

## 2019-12-05 MED ORDER — HYDROXYZINE HCL 25 MG PO TABS: ORAL_TABLET | ORAL | 1 refills | Status: DC

## 2019-12-05 NOTE — Progress Notes (Signed)
   Follow-Up Visit   Subjective  Frank Williamson is a 69 y.o. male who presents for the following: Rash. Patient has been treated for Herpes Zoster and has 2 days left of Valtrex. New rash started yesterday on arms, upper legs, trunk. Not itchy. He is not using anything topical. No recent illness, no fever. Not really itching much.  [place] He did take Oxycontin and Gabapentin for pain Sunday night before rash occurred.  He continues to take Gabapentin and Valtrex.  No other new medications.  The following portions of the chart were reviewed this encounter and updated as appropriate:      Review of Systems:  No other skin or systemic complaints except as noted in HPI or Assessment and Plan.  Objective  Well appearing patient in no apparent distress; mood and affect are within normal limits.  A focused examination was performed including face, scalp, arms, legs. Relevant physical exam findings are noted in the Assessment and Plan.  Objective  Arms, legs, trunk, neck: Diffuse macular papular exanthem on arms, legs, trunk, neck  Objective  Anterior Neck: 5.3mm keratotic papule  Objective  Right Upper Back: Crusted macules on R upper back/posterior shoulder- c/w healed shingles   Assessment & Plan  Rash Arms, legs, trunk, neck  Drug Reaction  d/c Valtrex, Gabapentin, OxyContin for now   Start hydroxyzine 25mg  take 1 po QHS Start Zyrtec QAM  Start Clobetasol/CeraVe mix Apply BID to rash  hydrOXYzine (ATARAX/VISTARIL) 25 MG tablet - Arms, legs, trunk, neck  clobetasol (TEMOVATE) 0.05 % external solution - Arms, legs, trunk, neck  Neoplasm of uncertain behavior of skin Anterior Neck  Skin / nail biopsy Type of biopsy: tangential   Informed consent: discussed and consent obtained   Patient was prepped and draped in usual sterile fashion: Area prepped with alcohol. Anesthesia: the lesion was anesthetized in a standard fashion     Anesthetic:  1% lidocaine w/ epinephrine 1-100,000 buffered w/ 8.4% NaHCO3 Instrument used: flexible razor blade   Hemostasis achieved with: pressure, aluminum chloride and electrodesiccation   Outcome: patient tolerated procedure well   Post-procedure details: wound care instructions given   Post-procedure details comment:  Ointment and small bandage applied  Specimen 1 - Surgical pathology Differential Diagnosis: Wart r/o SCC Check Margins: No 5.75mm keratotic papule  Herpes zoster without complication Right Upper Back  Much improved d/c Valtrex  Pt with minimal residual pain, so will also stop Gabapentin  Other Related Medications valACYclovir (VALTREX) 1000 MG tablet  Return as scheduled.  IJamesetta Orleans, CMA, am acting as scribe for Brendolyn Patty, MD .  Documentation: I have reviewed the above documentation for accuracy and completeness, and I agree with the above.  Brendolyn Patty MD

## 2019-12-05 NOTE — Patient Instructions (Addendum)
Wound Care Instructions  1. Cleanse wound gently with soap and water once a day then pat dry with clean gauze. Apply a thing coat of Petrolatum (petroleum jelly, "Vaseline") over the wound (unless you have an allergy to this). We recommend that you use a new, sterile tube of Vaseline. Do not pick or remove scabs. Do not remove the yellow or white "healing tissue" from the base of the wound.  2. Cover the wound with fresh, clean, nonstick gauze and secure with paper tape. You may use Band-Aids in place of gauze and tape if the would is small enough, but would recommend trimming much of the tape off as there is often too much. Sometimes Band-Aids can irritate the skin.  3. You should call the office for your biopsy report after 1 week if you have not already been contacted.  4. If you experience any problems, such as abnormal amounts of bleeding, swelling, significant bruising, significant pain, or evidence of infection, please call the office immediately.   Start Zyrtec every morning. Start hydroxyzine 25mg  take 1 pill every night.    Skin Care for Rash  Buy TWO 16oz jars of CeraVe moisturizing cream  [facility], [facility], [facility] (no prescription needed)  Costs about $15 per jar   Jar #1: Use as a moisturizer as needed. Can be applied to any area of the body. Use twice daily to unaffected areas.  Jar #2: Pour one 32ml bottle of clobetasol 0.05% solution into jar, mix well. Label this jar to indicate the medication has been added. Use twice daily to affected areas. Do not apply to face, groin or underarms.  Moisturizer may burn or sting initially. Try for at least 4 weeks.

## 2019-12-11 ENCOUNTER — Telehealth: Payer: Self-pay

## 2019-12-11 NOTE — Telephone Encounter (Signed)
Advised pt of bx results/sh ?

## 2019-12-11 NOTE — Telephone Encounter (Signed)
-----   Message from Brendolyn Patty, MD sent at 12/11/2019  9:50 AM EDT ----- Skin , anterior neck VERRUCA VULGARIS, ENDOPHYTIC TYPE, INFLAMED  Benign wart.  Needs cryotherapy if it recurs

## 2020-01-01 IMAGING — RF ESOPHAGUS/BARIUM SWALLOW/TABLET STUDY
9 of 12 series · 14 of 21 positions shown · non-contrast
Comparison: None.

CLINICAL DATA: Recent history of dysphagia with solids and liquids.
History of previous esophageal dilatation of amber 5791.

EXAM:
ESOPHOGRAM / BARIUM SWALLOW / BARIUM TABLET STUDY
TECHNIQUE: Combined double contrast and single contrast examination performed
using effervescent crystals, thick barium liquid, and thin barium
liquid. The patient was observed with fluoroscopy swallowing a 13 mm
barium sulphate tablet.
FLUOROSCOPY TIME:  Fluoroscopy Time:  3 minutes 9 seconds.
Radiation Exposure Index (if provided by the fluoroscopic device):
119.5 mGy
Number of Acquired Spot Images: 12

[Series 1: fluoro_barium 2fps_bw · 0.17mm/px · 1 of 2 frames shown (1 of 8)]
[frame 1/2]
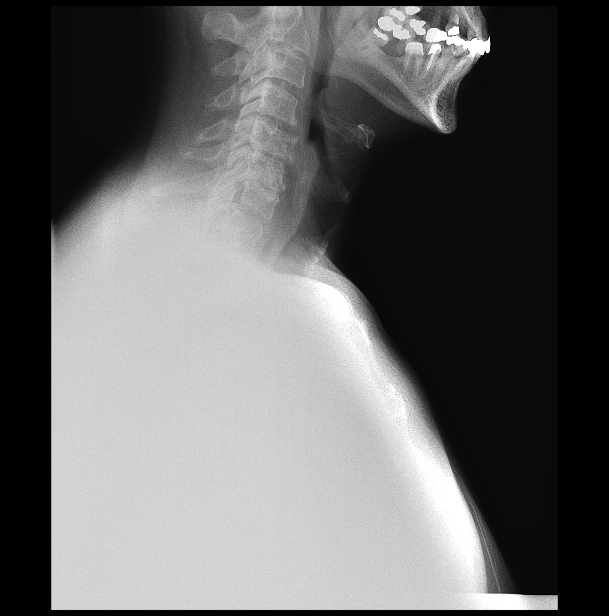

[Series 2: fluoro_barium 2fps_bw · 0.17mm/px · 1 of 1 slices shown (2 of 8)]
[im 1/1]
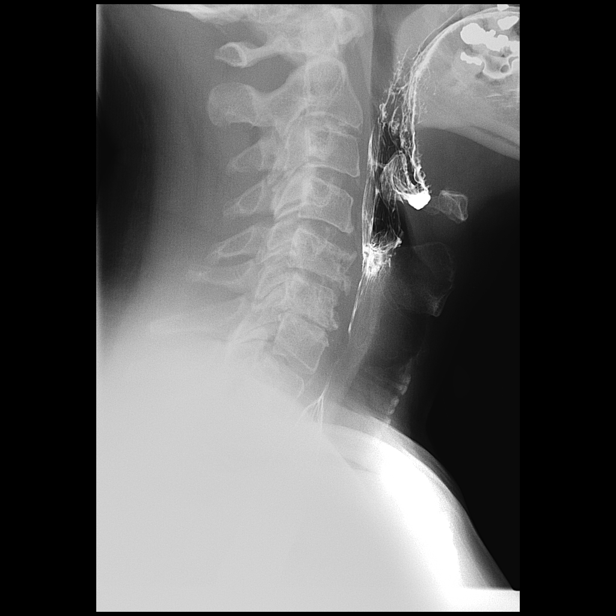

[Series 3: fluoro_barium 2fps_bw · 0.17mm/px · 1 of 1 slices shown (3 of 8)]
[im 1/1]
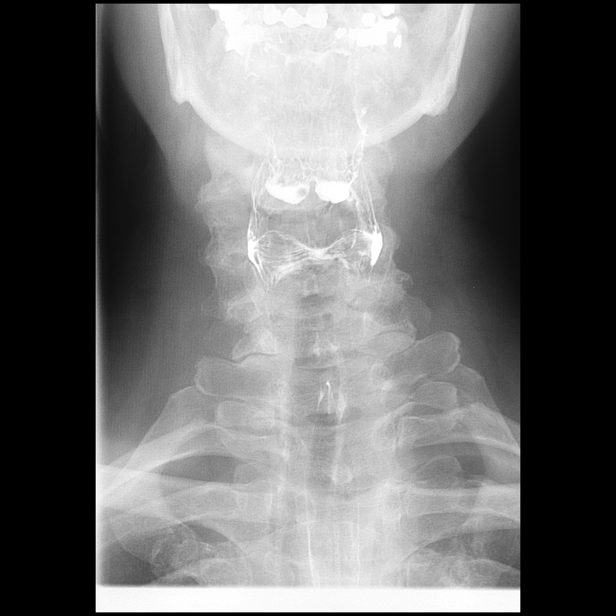

[Series 5: fluoro_barium 2fps_bw · 0.17mm/px · 2 of 2 frames shown (4 of 8)]
[frame 1/2]
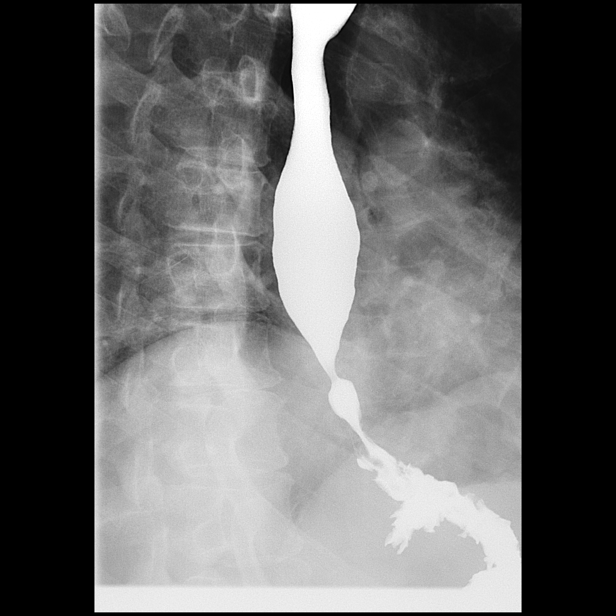
[frame 2/2]
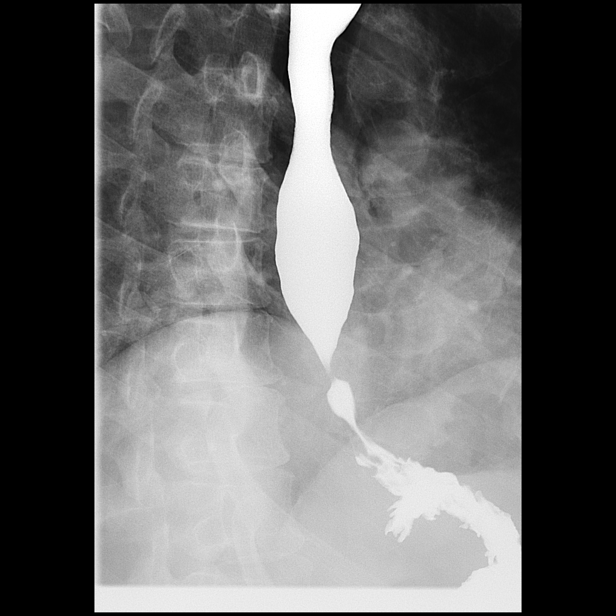

[Series 7: fluoro_barium 2fps_bw · 0.17mm/px · 2 of 2 frames shown (5 of 8)]
[frame 1/2]
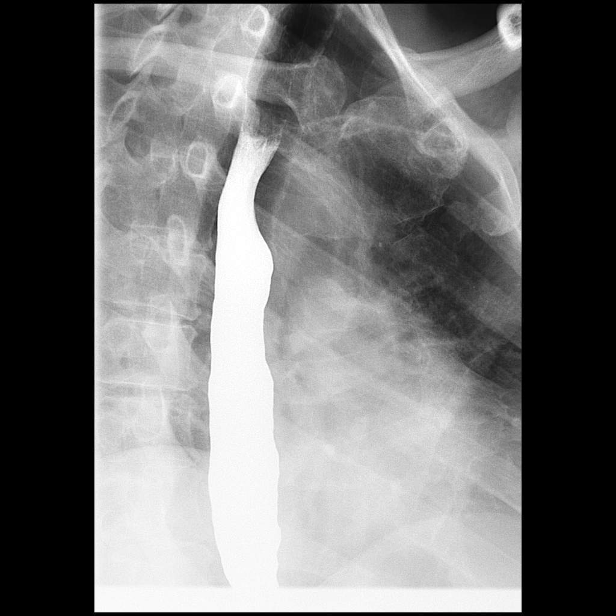
[frame 2/2]
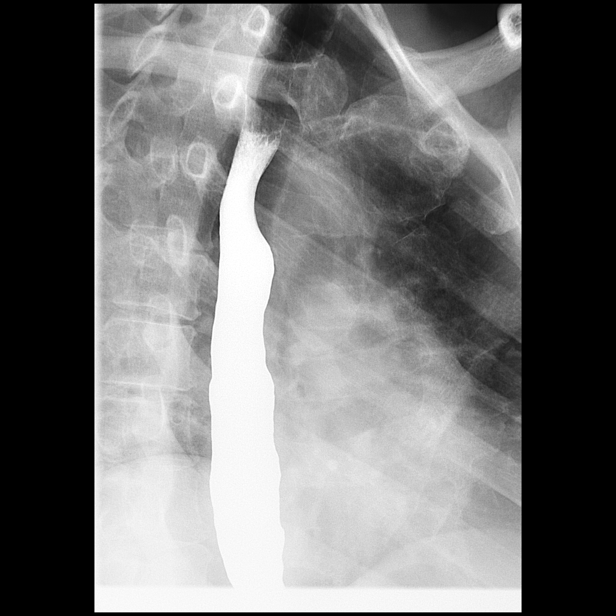

[Series 8: fluoro_barium 2fps_bw · 0.17mm/px · 1 of 2 frames shown (6 of 8)]
[frame 2/2]
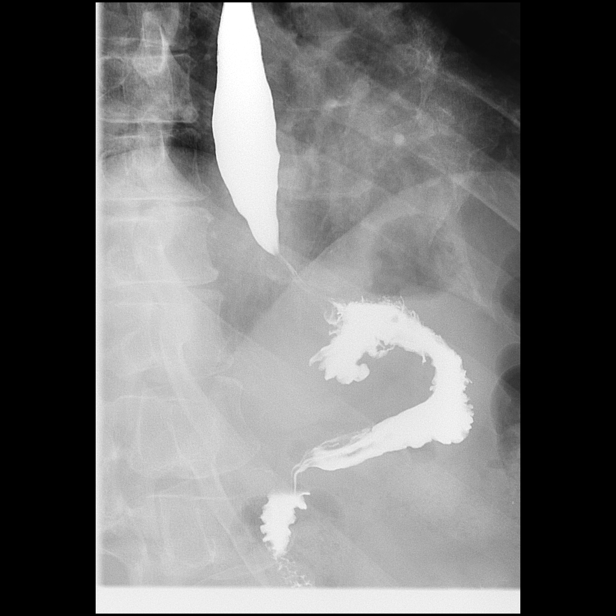

[Series 9: fluoro_barium 2fps_bw · 0.17mm/px · 1 of 2 frames shown (7 of 8)]
[frame 1/2]
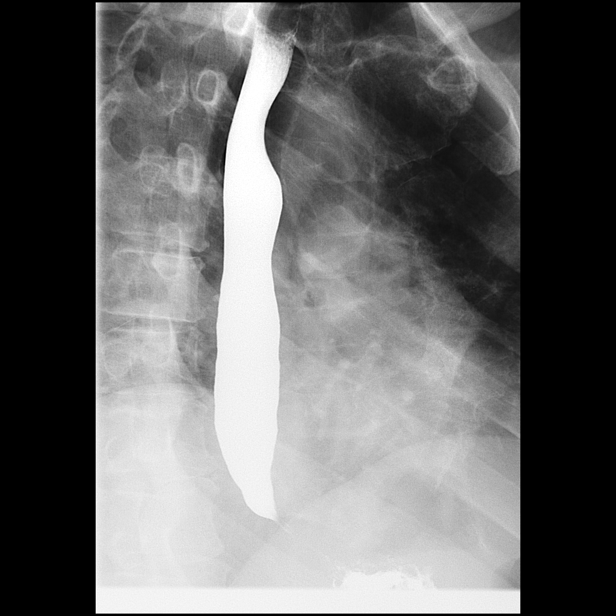

[Series 10: fluoro_barium 2fps_bw · 0.17mm/px · 2 of 2 frames shown (8 of 8)]
[frame 1/2]
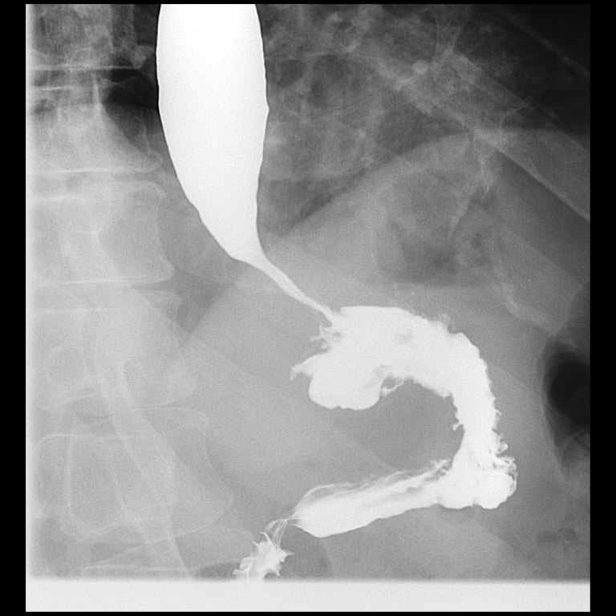
[frame 2/2]
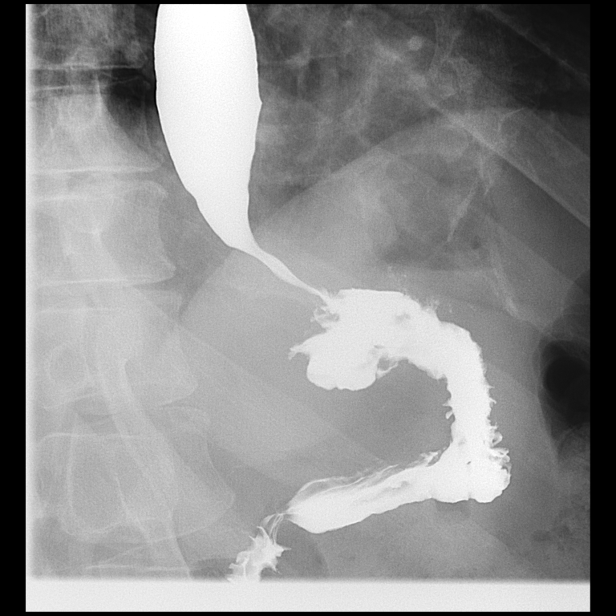

[Series 12: cp_standard · 0.17mm/px · 3 of 17 frames shown]
[frame 1/17]
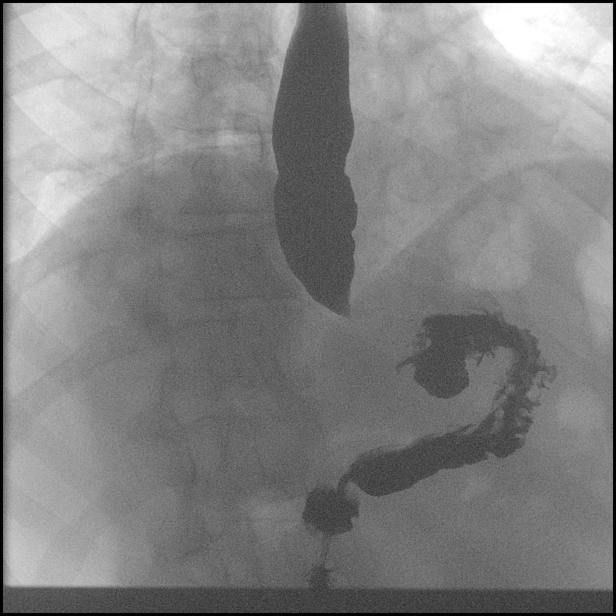
[frame 3/17]
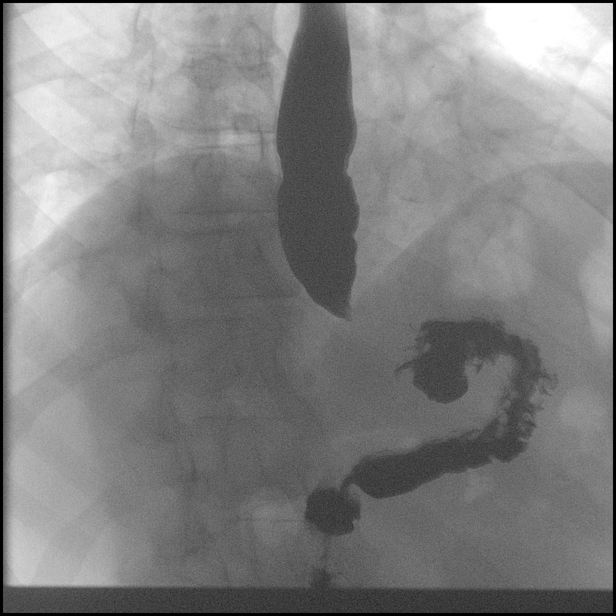
[frame 15/17]
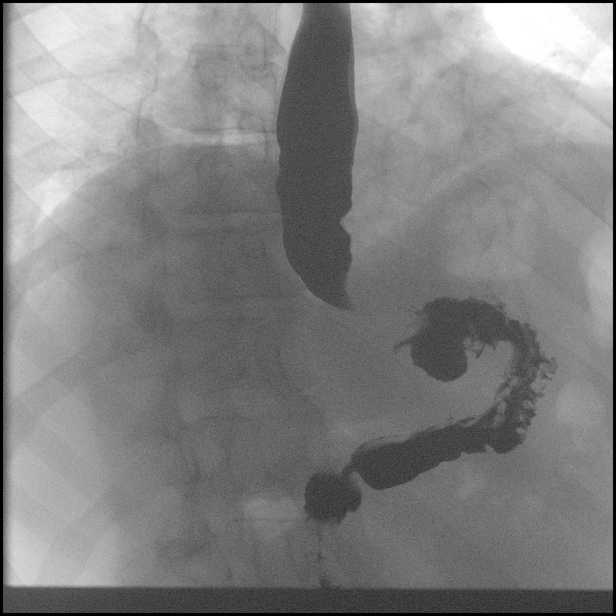

[14 of 21 positions shown; findings below may reference images not displayed]

FINDINGS: Exam demonstrates normal pharynx and hypopharynx. No evidence of
aspiration.

Esophagus is normal course and contour. There is evidence of
dysmotility with loss of the primary stripping wave above the aortic
arch. There is no focal mass or filling defect. There is a mild
stricture at the gastroesophageal junction measuring 2 cm in length.
This stricture demonstrates smooth borders and measures
approximately 3.1 mm in diameter at the time contrast passes
through. This stricture is nonobstructive, although causes moderate
delayed passage of contrast with moderate stasis of the barium
column within the esophagus and multiple 2 and fro movements within
the esophagus as well as multiple tertiary contractions.

Due to the moderate stasis of barium within the esophagus and
significant decrease clearance through the gastroesophageal
junction, patient was not evaluated in the prone position.
IMPRESSION: Mild stricturing of the gastroesophageal junction measuring
approximately 2 cm in length and approximately 3 mm in diameter.
This stricture is nonobstructive, although causes significant
delayed passage of contrast with resultant stasis of barium within
the esophagus.

## 2020-01-02 ENCOUNTER — Other Ambulatory Visit: Payer: Self-pay

## 2020-01-02 ENCOUNTER — Ambulatory Visit: Payer: PPO | Admitting: Dermatology

## 2020-01-02 DIAGNOSIS — L821 Other seborrheic keratosis: Secondary | ICD-10-CM

## 2020-01-02 DIAGNOSIS — D229 Melanocytic nevi, unspecified: Secondary | ICD-10-CM

## 2020-01-02 DIAGNOSIS — L578 Other skin changes due to chronic exposure to nonionizing radiation: Secondary | ICD-10-CM | POA: Diagnosis not present

## 2020-01-02 DIAGNOSIS — L719 Rosacea, unspecified: Secondary | ICD-10-CM

## 2020-01-02 DIAGNOSIS — Z86018 Personal history of other benign neoplasm: Secondary | ICD-10-CM

## 2020-01-02 DIAGNOSIS — D225 Melanocytic nevi of trunk: Secondary | ICD-10-CM

## 2020-01-02 DIAGNOSIS — D489 Neoplasm of uncertain behavior, unspecified: Secondary | ICD-10-CM

## 2020-01-02 DIAGNOSIS — L905 Scar conditions and fibrosis of skin: Secondary | ICD-10-CM

## 2020-01-02 DIAGNOSIS — L570 Actinic keratosis: Secondary | ICD-10-CM

## 2020-01-02 DIAGNOSIS — Z1283 Encounter for screening for malignant neoplasm of skin: Secondary | ICD-10-CM | POA: Diagnosis not present

## 2020-01-02 DIAGNOSIS — L814 Other melanin hyperpigmentation: Secondary | ICD-10-CM

## 2020-01-02 HISTORY — DX: Personal history of other benign neoplasm: Z86.018

## 2020-01-02 MED ORDER — METRONIDAZOLE 0.75 % EX CREA: TOPICAL_CREAM | Freq: Two times a day (BID) | CUTANEOUS | 0 refills | Status: AC

## 2020-01-02 NOTE — Progress Notes (Addendum)
Follow-Up Visit   Subjective  Frank Williamson is a 69 y.o. male who presents for the following: Annual Exam (1 year upper body exam ).  He has some persistent facial redness with bumps.  He also has some scaly spots on his scalp.   The following portions of the chart were reviewed this encounter and updated as appropriate:     Review of Systems: No other skin or systemic complaints except as noted in HPI or Assessment and Plan.   Objective  Well appearing patient in no apparent distress; mood and affect are within normal limits.  All skin waist up examined.  Objective  right posterier neck, upper back: Dyspigmented smooth macules or patch.   Objective  Left Crown Scalp: Stuck-on, waxy, tan-brown papules and plaques -- Discussed benign etiology and prognosis.   5 mm waxy tan papule  Objective  right spinal mid back: 4 x 5 mm 2-tone brown macule - right spinal mid back          Objective  Right medial scapula: 4 x 5 mm brown macule, darker center - right medial scapula      Objective  face: Mid face erythema with telangiectasias +/- scattered inflammatory papules.   Objective  Scalp x 4 (4): Erythematous thin papules/macules with gritty scale.   Assessment & Plan  Scar right posterier neck, upper back  Due to h/o shingles  benign  Seborrheic keratosis Left Crown Scalp  Reassured benign age-related growth.  Recommend observation.  Discussed cryotherapy if spot(s) become irritated or inflamed.   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 Anesthesia: the lesion was anesthetized in a standard fashion   Anesthetic:  1% lidocaine w/ epinephrine 1-100,000 buffered w/ 8.4% NaHCO3 Instrument used: flexible razor blade   Hemostasis achieved  with: pressure, aluminum chloride and electrodesiccation   Outcome: patient tolerated procedure well   Post-procedure details: wound care instructions given   Post-procedure details comment:  Ointment and small bandage applied  Specimen 1 - Surgical pathology Differential Diagnosis: nevus, r/o dysplasia Check Margins: No 4 x 5 mm 2-tone brown macule   Right medial scapula  Epidermal / dermal shaving  Lesion diameter (cm):  0.7 Informed consent: discussed and consent obtained   Patient was prepped and draped in usual sterile fashion: area prepped with alcohol. Anesthesia: the lesion was anesthetized in a standard fashion   Anesthetic:  1% lidocaine w/ epinephrine 1-100,000 buffered w/ 8.4% NaHCO3 Instrument used: flexible razor blade   Hemostasis achieved with: pressure, aluminum chloride and electrodesiccation   Outcome: patient tolerated procedure well   Post-procedure details: wound care instructions given   Post-procedure details comment:  Ointment and small bandage applied  Specimen 2 - Surgical pathology Differential Diagnosis: nevus, r/o dysplasia Check Margins: No 4 x 5 mm brown macule, darker center   Rosacea face  Chronic condition, moderate severity Start metronidazole 0.75% gel qd/bid. Apply to affected areas 1-2 times daily as needed.  Discussed adding in Doxycycline if not improving Sunscreen qam  Rosacea is a chronic progressive skin condition usually affecting the face of adults. It is treatable but not curable. It sometimes affects the eyes (ocular rosacea) as well. It may respond to topical and/or systemic medication and can flare with stress, sun exposure, alcohol, exercise and some foods.  metroNIDAZOLE (METROCREAM) 0.75 % cream - face  AK (actinic keratosis) (4) Scalp x 4      Destruction of lesion - Scalp x 4  Destruction method: cryotherapy   Informed consent: discussed and consent obtained   Lesion destroyed using liquid nitrogen: Yes     Region frozen until ice ball extended beyond lesion: Yes   Outcome: patient tolerated procedure well with no complications   Post-procedure details: wound care instructions given   Additional details:  Prior to procedure, discussed risks of blister formation, small wound, skin dyspigmentation, or rare scar following cryotherapy.  Lentigines - Scattered tan macules - Discussed due to sun exposure - Benign, observe - Call for any changes  Seborrheic Keratoses - Stuck-on, waxy, tan-brown papules and plaques  - Discussed benign etiology and prognosis. - Observe - Call for any changes  Melanocytic Nevi - Tan-brown and/or pink-flesh-colored symmetric macules and papules - Benign appearing on exam today - Observation - Call clinic for new or changing moles - Recommend daily use of broad spectrum spf 30+ sunscreen to sun-exposed areas.   Actinic Damage - Chronic, secondary to cumulative UV/sun exposure - diffuse scaly erythematous macules with underlying dyspigmentation - Recommend daily broad spectrum sunscreen SPF 30+ to sun-exposed areas, reapply every 2 hours as needed.  - Call for new or changing lesions.  Skin cancer screening performed today.  Return in about 1 year (around 01/01/2021) for UBSE.   I, Harriett Sine, CMA, am acting as scribe for Brendolyn Patty, MD.  Documentation: I have reviewed the above documentation for accuracy and completeness, and I agree with the above.  Brendolyn Patty MD

## 2020-01-02 NOTE — Patient Instructions (Addendum)
Rosacea  What is rosacea? Rosacea (say: ro-zay-sha) is a common skin disease that usually begins as a trend of flushing or blushing easily.  As rosacea progresses, a persistent redness in the center of the face will develop and may gradually spread beyond the nose and cheeks to the forehead and chin.  In some cases, the ears, chest, and back could be affected.  Rosacea may appear as tiny blood vessels or small red bumps that occur in crops.  Frequently they can contain pus, and are called "pustules".  If the bumps do not contain pus, they are referred to as "papules".  Rarely, in prolonged, untreated cases of rosacea, the oil glands of the nose and cheeks may become permanently enlarged.  This is called rhinophyma, and is seen more frequently in men.  Signs and Risks In its beginning stages, rosacea tends to come and go, which makes it difficult to recognize.  It can start as intermittent flushing of the face.  Eventually, blood vessels may become permanently visible.  Pustules and papules can appear, but can be mistaken for adult acne.  People of all races, ages, genders and ethnic groups are at risk of developing rosacea.  However, it is more common in women (especially around menopause) and adults with fair skin between the ages of 30 and 50.  Treatment Dermatologists typically recommend a combination of treatments to effectively manage rosacea.  Treatment can improve symptoms and may stop the progression of the rosacea.  Treatment may involve both topical and oral medications.  The tetracycline antibiotics are often used for their anti-inflammatory effect; however, because of the possibility of developing antibiotic resistance, they should not be used long term at full dose.  For dilated blood vessels the options include electrodessication (uses electric current through a small needle), laser treatment, and cosmetics to hide the redness.   With all forms of treatment, improvement is a slow process, and  patients may not see any results for the first 3-4 weeks.  It is very important to avoid the sun and other triggers.  Patients must wear sunscreen daily.  Skin Care Instructions: 1. Cleanse the skin with a mild soap such as CeraVe cleanser, Cetaphil cleanser, or Dove soap once or twice daily as needed. 2. Moisturize with Eucerin Redness Relief Daily Perfecting Lotion (has a subtle green tint), CeraVe Moisturizing Cream, or Oil of Olay Daily Moisturizer with sunscreen every morning and/or night as recommended. 3. Makeup should be "non-comedogenic" (won't clog pores) and be labeled "for sensitive skin". Good choices for cosmetics are: Neutrogena, Almay, and Physician's Formula.  Any product with a green tint tends to offset a red complexion. 4. If your eyes are dry and irritated, use artificial tears 2-3 times per day and cleanse the eyelids daily with baby shampoo.  Have your eyes examined at least every 2 years.  Be sure to tell your eye doctor that you have rosacea. 5. Alcoholic beverages tend to cause flushing of the skin, and may make rosacea worse. 6. Always wear sunscreen, protect your skin from extreme hot and cold temperatures, and avoid spicy foods, hot drinks, and mechanical irritation such as rubbing, scrubbing, or massaging the face.  Avoid harsh skin cleansers, cleansing masks, astringents, and exfoliation. If a particular product burns or makes your face feel tight, then it is likely to flare your rosacea. 7. If you are having difficulty finding a sunscreen that you can tolerate, you may try switching to a chemical-free sunscreen.  These are ones whose active   ingredient is zinc oxide or titanium dioxide only.  They should also be fragrance free, non-comedogenic, and labeled for sensitive skin. 8. Rosacea triggers may vary from person to person.  There are a variety of foods that have been reported to trigger rosacea.  Some patients find that keeping a diary of what they were doing when they  flared helps them avoid triggers.   Wound Care Instructions  1. Cleanse wound gently with soap and water once a day then pat dry with clean gauze. Apply a thing coat of Petrolatum (petroleum jelly, "Vaseline") over the wound (unless you have an allergy to this). We recommend that you use a new, sterile tube of Vaseline. Do not pick or remove scabs. Do not remove the yellow or white "healing tissue" from the base of the wound.  2. Cover the wound with fresh, clean, nonstick gauze and secure with paper tape. You may use Band-Aids in place of gauze and tape if the would is small enough, but would recommend trimming much of the tape off as there is often too much. Sometimes Band-Aids can irritate the skin.  3. You should call the office for your biopsy report after 1 week if you have not already been contacted.  4. If you experience any problems, such as abnormal amounts of bleeding, swelling, significant bruising, significant pain, or evidence of infection, please call the office immediately.  5. FOR ADULT SURGERY PATIENTS: If you need something for pain relief you may take 1 extra strength Tylenol (acetaminophen) AND 2 Ibuprofen (200mg  each) together every 4 hours as needed for pain. (do not take these if you are allergic to them or if you have a reason you should not take them.) Typically, you may only need pain medication for 1 to 3 days.

## 2020-01-08 ENCOUNTER — Telehealth: Payer: Self-pay

## 2020-01-08 NOTE — Telephone Encounter (Signed)
Advised patient of biopsy results. 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

## 2020-01-08 NOTE — Telephone Encounter (Signed)
-----   Message from Brendolyn Patty, MD sent at 01/08/2020  9:33 AM EST ----- 1. 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 Skin , right medial scapula DYSPLASTIC COMPOUND NEVUS WITH SEVERE ATYPIA, CLOSE TO MARGIN  1. Mildly atypical mole, observe 2. Severely atypical mole- needs excision

## 2020-02-08 DIAGNOSIS — I10 Essential (primary) hypertension: Secondary | ICD-10-CM | POA: Diagnosis not present

## 2020-02-08 DIAGNOSIS — Z125 Encounter for screening for malignant neoplasm of prostate: Secondary | ICD-10-CM | POA: Diagnosis not present

## 2020-02-08 DIAGNOSIS — E785 Hyperlipidemia, unspecified: Secondary | ICD-10-CM | POA: Diagnosis not present

## 2020-02-08 DIAGNOSIS — Z79899 Other long term (current) drug therapy: Secondary | ICD-10-CM | POA: Diagnosis not present

## 2020-02-08 DIAGNOSIS — K219 Gastro-esophageal reflux disease without esophagitis: Secondary | ICD-10-CM | POA: Diagnosis not present

## 2020-02-08 DIAGNOSIS — E039 Hypothyroidism, unspecified: Secondary | ICD-10-CM | POA: Diagnosis not present

## 2020-02-22 DIAGNOSIS — E785 Hyperlipidemia, unspecified: Secondary | ICD-10-CM | POA: Diagnosis not present

## 2020-02-22 DIAGNOSIS — E039 Hypothyroidism, unspecified: Secondary | ICD-10-CM | POA: Diagnosis not present

## 2020-02-22 DIAGNOSIS — Z125 Encounter for screening for malignant neoplasm of prostate: Secondary | ICD-10-CM | POA: Diagnosis not present

## 2020-02-22 DIAGNOSIS — Z79899 Other long term (current) drug therapy: Secondary | ICD-10-CM | POA: Diagnosis not present

## 2020-02-22 DIAGNOSIS — I10 Essential (primary) hypertension: Secondary | ICD-10-CM | POA: Diagnosis not present

## 2020-03-04 ENCOUNTER — Encounter: Payer: PPO | Admitting: Dermatology

## 2020-03-20 ENCOUNTER — Encounter: Payer: PPO | Admitting: Dermatology

## 2020-05-06 ENCOUNTER — Encounter: Payer: Self-pay | Admitting: Dermatology

## 2020-05-06 ENCOUNTER — Ambulatory Visit: Payer: PPO | Admitting: Dermatology

## 2020-05-06 ENCOUNTER — Other Ambulatory Visit: Payer: Self-pay

## 2020-05-06 DIAGNOSIS — D239 Other benign neoplasm of skin, unspecified: Secondary | ICD-10-CM

## 2020-05-06 DIAGNOSIS — D235 Other benign neoplasm of skin of trunk: Secondary | ICD-10-CM

## 2020-05-06 DIAGNOSIS — L988 Other specified disorders of the skin and subcutaneous tissue: Secondary | ICD-10-CM | POA: Diagnosis not present

## 2020-05-06 NOTE — Patient Instructions (Signed)

## 2020-05-06 NOTE — Progress Notes (Signed)
   Follow-Up Visit   Subjective  Frank Williamson is a 70 y.o. male who presents for the following: Severe dysplastic nevus, bx proven (R medial scapula, pt presents for excision).   The following portions of the chart were reviewed this encounter and updated as appropriate:       Review of Systems:  No other skin or systemic complaints except as noted in HPI or Assessment and Plan.  Objective  Well appearing patient in no apparent distress; mood and affect are within normal limits.  A focused examination was performed including back. Relevant physical exam findings are noted in the Assessment and Plan.  Objective  Right medial scapula: Pink bx site 0.9 x 0.6cm   Assessment & Plan  Dysplastic nevus Right medial scapula  Skin excision - Right medial scapula  Lesion length (cm):  0.9 Lesion width (cm):  0.6 Margin per side (cm):  0.2 Total excision diameter (cm):  1.3 Informed consent: discussed and consent obtained   Timeout: patient name, date of birth, surgical site, and procedure verified   Procedure prep:  Patient was prepped and draped in usual sterile fashion Prep type:  Povidone-iodine Anesthesia: the lesion was anesthetized in a standard fashion   Anesthetic:  1% lidocaine w/ epinephrine 1-100,000 buffered w/ 8.4% NaHCO3 (13.0cc) Instrument used: #15 blade   Hemostasis achieved with: suture, pressure and electrodesiccation   Outcome: patient tolerated procedure well with no complications   Additional details:  12:00 superior medial tag  Skin repair - Right medial scapula Complexity:  Intermediate Final length (cm):  2.7 Informed consent: discussed and consent obtained   Reason for type of repair: reduce tension to allow closure, reduce the risk of dehiscence, infection, and necrosis and reduce subcutaneous dead space and avoid a hematoma   Undermining: edges undermined   Subcutaneous layers (deep stitches):  Suture size:  3-0 Suture type: Vicryl  (polyglactin 910)   Stitches:  Buried vertical mattress Fine/surface layer approximation (top stitches):  Suture size:  4-0 Suture type: nylon   Stitches: vertical mattress and simple interrupted   Suture removal (days):  7 Hemostasis achieved with: suture and pressure Outcome: patient tolerated procedure well with no complications   Post-procedure details: sterile dressing applied and wound care instructions given   Dressing type: pressure dressing (Mupirocin)    Specimen 1 - Surgical pathology Differential Diagnosis: D48.5 Severe Dysplastic Nevus *12:00 superior medial tag Check Margins: yes Pink bx site 9.0 x 6.50mm [id number]  Return in about 1 week (around 05/13/2020) for suture removal.  I, Sonya Hupman, RMA, am acting as scribe for Brendolyn Patty, MD . Documentation: I have reviewed the above documentation for accuracy and completeness, and I agree with the above.  Brendolyn Patty MD

## 2020-05-07 ENCOUNTER — Telehealth: Payer: Self-pay

## 2020-05-07 NOTE — Telephone Encounter (Signed)
Spoke to patient's wife and she advised patient doing well after yesterday's surgery.  Advised for pt to call office if any problems./sh

## 2020-05-13 ENCOUNTER — Ambulatory Visit (INDEPENDENT_AMBULATORY_CARE_PROVIDER_SITE_OTHER): Payer: PPO | Admitting: Dermatology

## 2020-05-13 ENCOUNTER — Other Ambulatory Visit: Payer: Self-pay

## 2020-05-13 DIAGNOSIS — D239 Other benign neoplasm of skin, unspecified: Secondary | ICD-10-CM

## 2020-05-13 DIAGNOSIS — D235 Other benign neoplasm of skin of trunk: Secondary | ICD-10-CM

## 2020-05-13 NOTE — Progress Notes (Signed)
   Follow-Up Visit   Subjective  Frank Williamson is a 70 y.o. male who presents for the following: Post op (Severe dysplastic nevus - margins free of the right medial scapula.).  No problems healing.   The following portions of the chart were reviewed this encounter and updated as appropriate:       Review of Systems:  No other skin or systemic complaints except as noted in HPI or Assessment and Plan.  Objective  Well appearing patient in no apparent distress; mood and affect are within normal limits.  A focused examination was performed including back. Relevant physical exam findings are noted in the Assessment and Plan.  Objective  Right Medial Scapula: Excision site healing well- is clean, dry and intact    Assessment & Plan  Dysplastic nevus Right Medial Scapula  Margins free, biopsy proven.  Wound cleansed, sutures removed, wound cleansed and steri strips applied. Discussed pathology results.   Return as scheduled.  IJamesetta Orleans, CMA, am acting as scribe for Brendolyn Patty, MD .  Documentation: I have reviewed the above documentation for accuracy and completeness, and I agree with the above.  Brendolyn Patty MD

## 2020-05-13 NOTE — Patient Instructions (Signed)

## 2020-05-17 DIAGNOSIS — E7800 Pure hypercholesterolemia, unspecified: Secondary | ICD-10-CM | POA: Diagnosis not present

## 2020-05-17 DIAGNOSIS — Z0000 Encounter for general adult medical examination without abnormal findings: Secondary | ICD-10-CM | POA: Diagnosis not present

## 2020-05-17 DIAGNOSIS — E039 Hypothyroidism, unspecified: Secondary | ICD-10-CM | POA: Diagnosis not present

## 2020-05-17 DIAGNOSIS — Z79899 Other long term (current) drug therapy: Secondary | ICD-10-CM | POA: Diagnosis not present

## 2020-05-17 DIAGNOSIS — I10 Essential (primary) hypertension: Secondary | ICD-10-CM | POA: Diagnosis not present

## 2020-07-02 DIAGNOSIS — S20462A Insect bite (nonvenomous) of left back wall of thorax, initial encounter: Secondary | ICD-10-CM | POA: Diagnosis not present

## 2020-07-02 DIAGNOSIS — W57XXXA Bitten or stung by nonvenomous insect and other nonvenomous arthropods, initial encounter: Secondary | ICD-10-CM | POA: Diagnosis not present

## 2020-08-22 DIAGNOSIS — Z79899 Other long term (current) drug therapy: Secondary | ICD-10-CM | POA: Diagnosis not present

## 2020-08-22 DIAGNOSIS — E7800 Pure hypercholesterolemia, unspecified: Secondary | ICD-10-CM | POA: Diagnosis not present

## 2020-08-22 DIAGNOSIS — E039 Hypothyroidism, unspecified: Secondary | ICD-10-CM | POA: Diagnosis not present

## 2020-08-23 DIAGNOSIS — Z125 Encounter for screening for malignant neoplasm of prostate: Secondary | ICD-10-CM | POA: Diagnosis not present

## 2020-08-23 DIAGNOSIS — E039 Hypothyroidism, unspecified: Secondary | ICD-10-CM | POA: Diagnosis not present

## 2020-08-23 DIAGNOSIS — I10 Essential (primary) hypertension: Secondary | ICD-10-CM | POA: Diagnosis not present

## 2020-08-23 DIAGNOSIS — E782 Mixed hyperlipidemia: Secondary | ICD-10-CM | POA: Diagnosis not present

## 2020-08-23 DIAGNOSIS — K2100 Gastro-esophageal reflux disease with esophagitis, without bleeding: Secondary | ICD-10-CM | POA: Diagnosis not present

## 2020-08-23 DIAGNOSIS — Z79899 Other long term (current) drug therapy: Secondary | ICD-10-CM | POA: Diagnosis not present

## 2021-01-14 ENCOUNTER — Encounter: Payer: PPO | Admitting: Dermatology

## 2021-02-15 DIAGNOSIS — J069 Acute upper respiratory infection, unspecified: Secondary | ICD-10-CM | POA: Diagnosis not present

## 2021-02-15 DIAGNOSIS — Z03818 Encounter for observation for suspected exposure to other biological agents ruled out: Secondary | ICD-10-CM | POA: Diagnosis not present

## 2021-02-20 DIAGNOSIS — E039 Hypothyroidism, unspecified: Secondary | ICD-10-CM | POA: Diagnosis not present

## 2021-02-20 DIAGNOSIS — E782 Mixed hyperlipidemia: Secondary | ICD-10-CM | POA: Diagnosis not present

## 2021-02-20 DIAGNOSIS — Z79899 Other long term (current) drug therapy: Secondary | ICD-10-CM | POA: Diagnosis not present

## 2021-02-20 DIAGNOSIS — Z125 Encounter for screening for malignant neoplasm of prostate: Secondary | ICD-10-CM | POA: Diagnosis not present

## 2021-02-20 DIAGNOSIS — I10 Essential (primary) hypertension: Secondary | ICD-10-CM | POA: Diagnosis not present

## 2021-02-27 DIAGNOSIS — E7800 Pure hypercholesterolemia, unspecified: Secondary | ICD-10-CM | POA: Diagnosis not present

## 2021-02-27 DIAGNOSIS — I10 Essential (primary) hypertension: Secondary | ICD-10-CM | POA: Diagnosis not present

## 2021-02-27 DIAGNOSIS — E039 Hypothyroidism, unspecified: Secondary | ICD-10-CM | POA: Diagnosis not present

## 2021-04-23 ENCOUNTER — Other Ambulatory Visit: Payer: Self-pay

## 2021-04-23 ENCOUNTER — Ambulatory Visit: Payer: PPO | Admitting: Dermatology

## 2021-04-23 DIAGNOSIS — D489 Neoplasm of uncertain behavior, unspecified: Secondary | ICD-10-CM | POA: Diagnosis not present

## 2021-04-23 DIAGNOSIS — L578 Other skin changes due to chronic exposure to nonionizing radiation: Secondary | ICD-10-CM | POA: Diagnosis not present

## 2021-04-23 DIAGNOSIS — D1800 Hemangioma unspecified site: Secondary | ICD-10-CM | POA: Diagnosis not present

## 2021-04-23 DIAGNOSIS — L570 Actinic keratosis: Secondary | ICD-10-CM | POA: Diagnosis not present

## 2021-04-23 DIAGNOSIS — C44519 Basal cell carcinoma of skin of other part of trunk: Secondary | ICD-10-CM | POA: Diagnosis not present

## 2021-04-23 DIAGNOSIS — L821 Other seborrheic keratosis: Secondary | ICD-10-CM

## 2021-04-23 DIAGNOSIS — D229 Melanocytic nevi, unspecified: Secondary | ICD-10-CM | POA: Diagnosis not present

## 2021-04-23 DIAGNOSIS — L719 Rosacea, unspecified: Secondary | ICD-10-CM

## 2021-04-23 DIAGNOSIS — Z1283 Encounter for screening for malignant neoplasm of skin: Secondary | ICD-10-CM | POA: Diagnosis not present

## 2021-04-23 DIAGNOSIS — L814 Other melanin hyperpigmentation: Secondary | ICD-10-CM | POA: Diagnosis not present

## 2021-04-23 DIAGNOSIS — Z86018 Personal history of other benign neoplasm: Secondary | ICD-10-CM | POA: Diagnosis not present

## 2021-04-23 DIAGNOSIS — B079 Viral wart, unspecified: Secondary | ICD-10-CM | POA: Diagnosis not present

## 2021-04-23 DIAGNOSIS — C4491 Basal cell carcinoma of skin, unspecified: Secondary | ICD-10-CM

## 2021-04-23 HISTORY — DX: Basal cell carcinoma of skin, unspecified: C44.91

## 2021-04-23 NOTE — Progress Notes (Signed)
? ?Follow-Up Visit ?  ?Subjective  ?Frank Williamson is a 71 y.o. male who presents for the following: Follow-up (Patient here today for 1 year tbse. Patient reports a skin tag at right side of neck. Patient has history of dysplastic nevus. ). He has h/o rosacea but doesn't use topical metrogel anymore. ? ? ?The patient presents for Upper Body Skin Exam (UBSE) for skin cancer screening and mole check.  The patient has spots, moles and lesions to be evaluated, some may be new or changing and the patient has concerns that these could be cancer. ? ?The following portions of the chart were reviewed this encounter and updated as appropriate:   ?  ? ?Review of Systems: No other skin or systemic complaints except as noted in HPI or Assessment and Plan. ? ? ?Objective  ?Well appearing patient in no apparent distress; mood and affect are within normal limits. ? ?All skin waist up examined. ? ?right neck x 1 ?Filiform scaly papule at right neck  ? ?mid face and nose ?Erythema with telangectiasia at cheeks and nose  ? ?crown x 1, left spinal mid upper back x 1 (2) ?Erythematous thin papules/macules with gritty scale.  ? ?spinal upper back ?7 x 4 mm pink brown papule  ? ? ? ? ? ?Assessment & Plan  ?Viral warts, unspecified type ?right neck x 1 ? ?Wart vs irritated skin tag  ? ? ?Destruction of lesion - right neck x 1 ? ?Destruction method: cryotherapy   ?Informed consent: discussed and consent obtained   ?Lesion destroyed using liquid nitrogen: Yes   ?Region frozen until ice ball extended beyond lesion: Yes   ?Outcome: patient tolerated procedure well with no complications   ?Post-procedure details: wound care instructions given   ?Additional details:  Prior to procedure, discussed risks of blister formation, small wound, skin dyspigmentation, or rare scar following cryotherapy. Recommend Vaseline ointment to treated areas while healing. ? ? ?Rosacea ?mid face and nose ? ?Rosacea is a chronic progressive skin condition  usually affecting the face of adults, causing redness and/or acne bumps. It is treatable but not curable. It sometimes affects the eyes (ocular rosacea) as well. It may respond to topical and/or systemic medication and can flare with stress, sun exposure, alcohol, exercise and some foods.  Daily application of broad spectrum spf 30+ sunscreen to face is recommended to reduce flares. ? ?Patient defers treatment at this time ? ?Actinic keratosis (2) ?crown x 1, left spinal mid upper back x 1 ? ?HyAK vrs ISK at left spinal mid upper back  ? ? ? ?Actinic keratoses are precancerous spots that appear secondary to cumulative UV radiation exposure/sun exposure over time. They are chronic with expected duration over 1 year. A portion of actinic keratoses will progress to squamous cell carcinoma of the skin. It is not possible to reliably predict which spots will progress to skin cancer and so treatment is recommended to prevent development of skin cancer. ? ?Recommend daily broad spectrum sunscreen SPF 30+ to sun-exposed areas, reapply every 2 hours as needed.  ?Recommend staying in the shade or wearing long sleeves, sun glasses (UVA+UVB protection) and wide brim hats (4-inch brim around the entire circumference of the hat). ?Call for new or changing lesions. ? ?Destruction of lesion - crown x 1, left spinal mid upper back x 1 ? ?Destruction method: cryotherapy   ?Informed consent: discussed and consent obtained   ?Lesion destroyed using liquid nitrogen: Yes   ?Region frozen until ice ball  extended beyond lesion: Yes   ?Outcome: patient tolerated procedure well with no complications   ?Post-procedure details: wound care instructions given   ?Additional details:  Prior to procedure, discussed risks of blister formation, small wound, skin dyspigmentation, or rare scar following cryotherapy. Recommend Vaseline ointment to treated areas while healing.  ? ?Neoplasm of uncertain behavior ?spinal upper back ? ?Epidermal / dermal  shaving ? ?Lesion diameter (cm):  0.8 ?Informed consent: discussed and consent obtained   ?Patient was prepped and draped in usual sterile fashion: Area prepped with alcohol. ?Anesthesia: the lesion was anesthetized in a standard fashion   ?Anesthetic:  1% lidocaine w/ epinephrine 1-100,000 buffered w/ 8.4% NaHCO3 ?Instrument used: flexible razor blade   ?Hemostasis achieved with: pressure, aluminum chloride and electrodesiccation   ?Outcome: patient tolerated procedure well   ?Post-procedure details: wound care instructions given   ?Post-procedure details comment:  Ointment and small bandage applied.  ? ?Specimen 1 - Surgical pathology ?Differential Diagnosis: Irritated nevus r/o bcc  ? ?Check Margins: No ? ?Irritated nevus r/o bcc  ? ? ? ?Lentigines ?- Scattered tan macules ?- Due to sun exposure ?- Benign-appearing, observe ?- Recommend daily broad spectrum sunscreen SPF 30+ to sun-exposed areas, reapply every 2 hours as needed. ?- Call for any changes ? ?Seborrheic Keratoses ?- Stuck-on, waxy, tan-brown papules and/or plaques  ?- Benign-appearing ?- Discussed benign etiology and prognosis. ?- Observe ?- Call for any changes ? ?Melanocytic Nevi ?- Tan-brown and/or pink-flesh-colored symmetric macules and papules ?- Benign appearing on exam today ?- Observation ?- Call clinic for new or changing moles ?- Recommend daily use of broad spectrum spf 30+ sunscreen to sun-exposed areas.  ? ?Hemangiomas ?- Red papules ?- Discussed benign nature ?- Observe ?- Call for any changes ? ?Actinic Damage ?- Chronic condition, secondary to cumulative UV/sun exposure ?- diffuse scaly erythematous macules with underlying dyspigmentation ?- Recommend daily broad spectrum sunscreen SPF 30+ to sun-exposed areas, reapply every 2 hours as needed.  ?- Staying in the shade or wearing long sleeves, sun glasses (UVA+UVB protection) and wide brim hats (4-inch brim around the entire circumference of the hat) are also recommended for sun  protection.  ?- Call for new or changing lesions. ? ?History of Dysplastic Nevi ?- No evidence of recurrence today at right medial scapula 2022 excised, and right spinal mid back 2021 ?- Recommend regular full body skin exams ?- Recommend daily broad spectrum sunscreen SPF 30+ to sun-exposed areas, reapply every 2 hours as needed.  ?- Call if any new or changing lesions are noted between office visits ? ?Skin cancer screening performed today. ?Return in about 1 year (around 04/24/2022) for UBSE. ?I, Ruthell Rummage, CMA, am acting as scribe for Brendolyn Patty, MD. ? ?Documentation: I have reviewed the above documentation for accuracy and completeness, and I agree with the above. ? ?Brendolyn Patty MD  ? ?

## 2021-04-23 NOTE — Patient Instructions (Addendum)
Biopsy Wound Care Instructions  Leave the original bandage on for 24 hours if possible.  If the bandage becomes soaked or soiled before that time, it is OK to remove it and examine the wound.  A small amount of post-operative bleeding is normal.  If excessive bleeding occurs, remove the bandage, place gauze over the site and apply continuous pressure (no peeking) over the area for 30 minutes. If this does not work, please call our clinic as soon as possible or page your doctor if it is after hours.   Once a day, cleanse the wound with soap and water. It is fine to shower. If a thick crust develops you may use a Q-tip dipped into dilute hydrogen peroxide (mix 1:1 with water) to dissolve it.  Hydrogen peroxide can slow the healing process, so use it only as needed.    After washing, apply petroleum jelly (Vaseline) or an antibiotic ointment if your doctor prescribed one for you, followed by a bandage.    For best healing, the wound should be covered with a layer of ointment at all times. If you are not able to keep the area covered with a bandage to hold the ointment in place, this may mean re-applying the ointment several times a day.  Continue this wound care until the wound has healed and is no longer open.   Itching and mild discomfort is normal during the healing process. However, if you develop pain or severe itching, please call our office.   If you have any discomfort, you can take Tylenol (acetaminophen) or ibuprofen as directed on the bottle. (Please do not take these if you have an allergy to them or cannot take them for another reason).  Some redness, tenderness and white or yellow material in the wound is normal healing.  If the area becomes very sore and red, or develops a thick yellow-green material (pus), it may be infected; please notify us.    If you have stitches, return to clinic as directed to have the stitches removed. You will continue wound care for 2-3 days after the stitches  are removed.   Wound healing continues for up to one year following surgery. It is not unusual to experience pain in the scar from time to time during the interval.  If the pain becomes severe or the scar thickens, you should notify the office.    A slight amount of redness in a scar is expected for the first six months.  After six months, the redness will fade and the scar will soften and fade.  The color difference becomes less noticeable with time.  If there are any problems, return for a post-op surgery check at your earliest convenience.  To improve the appearance of the scar, you can use silicone scar gel, cream, or sheets (such as Mederma or Serica) every night for up to one year. These are available over the counter (without a prescription).  Please call our office at [phone] for any questions or concerns.           Actinic keratoses are precancerous spots that appear secondary to cumulative UV radiation exposure/sun exposure over time. They are chronic with expected duration over 1 year. A portion of actinic keratoses will progress to squamous cell carcinoma of the skin. It is not possible to reliably predict which spots will progress to skin cancer and so treatment is recommended to prevent development of skin cancer.  Recommend daily broad spectrum sunscreen SPF 30+ to sun-exposed areas,  reapply every 2 hours as needed.  Recommend staying in the shade or wearing long sleeves, sun glasses (UVA+UVB protection) and wide brim hats (4-inch brim around the entire circumference of the hat). Call for new or changing lesions.   Cryotherapy Aftercare  Wash gently with soap and water everyday.   Apply Vaseline and Band-Aid daily until healed.        Melanoma ABCDEs  Melanoma is the most dangerous type of skin cancer, and is the leading cause of death from skin disease.  You are more likely to develop melanoma if you: Have light-colored skin, light-colored eyes, or red or  blond hair Spend a lot of time in the sun Tan regularly, either outdoors or in a tanning bed Have had blistering sunburns, especially during childhood Have a close family member who has had a melanoma Have atypical moles or large birthmarks  Early detection of melanoma is key since treatment is typically straightforward and cure rates are extremely high if we catch it early.   The first sign of melanoma is often a change in a mole or a new dark spot.  The ABCDE system is a way of remembering the signs of melanoma.  A for asymmetry:  The two halves do not match. B for border:  The edges of the growth are irregular. C for color:  A mixture of colors are present instead of an even brown color. D for diameter:  Melanomas are usually (but not always) greater than 48m - the size of a pencil eraser. E for evolution:  The spot keeps changing in size, shape, and color.  Please check your skin once per month between visits. You can use a small mirror in front and a large mirror behind you to keep an eye on the back side or your body.   If you see any new or changing lesions before your next follow-up, please call to schedule a visit.  Please continue daily skin protection including broad spectrum sunscreen SPF 30+ to sun-exposed areas, reapplying every 2 hours as needed when you're outdoors.   Staying in the shade or wearing long sleeves, sun glasses (UVA+UVB protection) and wide brim hats (4-inch brim around the entire circumference of the hat) are also recommended for sun protection.    If You Need Anything After Your Visit  If you have any questions or concerns for your doctor, please call our main line at [phone] press option 4 to reach your doctor's medical assistant. If no one answers, please leave a voicemail as directed and we will return your call as soon as possible. Messages left after 4 pm will be answered the following business day.   You may also send [place] message via  [place] We typically respond to MyChart messages within 1-2 business days.  For prescription refills, please ask your pharmacy to contact our office. Our fax number is [phone]  If you have an urgent issue when the clinic is closed that cannot wait until the next business day, you can page your doctor at the number below.    Please note that while we do our best to be available for urgent issues outside of office hours, we are not available 24/7.   If you have an urgent issue and are unable to reach [place] you may choose to seek medical care at your doctor's office, retail clinic, urgent care center, or emergency room.  If you have a medical emergency, please immediately call 911 or go to the  emergency department.  Pager Numbers  - Dr. Nehemiah Massed: [phone]  - Dr. Laurence Ferrari: [phone]  - Dr. Nicole Kindred: [phone]  In the event of inclement weather, please call our main line at [phone] for an update on the status of any delays or closures.  Dermatology Medication Tips: Please keep the boxes that topical medications come in in order to help keep track of the instructions about where and how to use these. Pharmacies typically print the medication instructions only on the boxes and not directly on the medication tubes.   If your medication is too expensive, please contact our office at [phone] option 4 or send [place] a message through [place].   We are unable to tell what your co-pay for medications will be in advance as this is different depending on your insurance coverage. However, we may be able to find a substitute medication at lower cost or fill out paperwork to get insurance to cover a needed medication.   If a prior authorization is required to get your medication covered by your insurance company, please allow [place] 1-2 business days to complete this process.  Drug prices often vary depending on where the prescription is filled and some pharmacies may offer cheaper  prices.  The website www.goodrx.com contains coupons for medications through different pharmacies. The prices here do not account for what the cost may be with help from insurance (it may be cheaper with your insurance), but the website can give you the price if you did not use any insurance.  - You can print the associated coupon and take it with your prescription to the pharmacy.  - You may also stop by our office during regular business hours and pick up a GoodRx coupon card.  - If you need your prescription sent electronically to a different pharmacy, notify our office through [facility] or by phone at [phone] option 4.     Si Usted Necesita Algo Despus de Su Visita  Tambin puede enviarnos un mensaje a travs de Pharmacist, community. Por lo general respondemos a los mensajes de MyChart en el transcurso de 1 a 2 das hbiles.  Para renovar recetas, por favor pida a su farmacia que se ponga en contacto con nuestra oficina. Harland Dingwall de fax es [place] [phone].  Si tiene un asunto urgente cuando la clnica est cerrada y que no puede esperar hasta el siguiente da hbil, puede llamar/localizar a su doctor(a) al nmero que aparece a continuacin.   Por favor, tenga en cuenta que aunque hacemos todo lo posible para estar disponibles para asuntos urgentes fuera del horario de [place], no estamos disponibles las 24 horas del da, los 7 das de la [place].   Si tiene un problema urgente y no puede comunicarse con nosotros, puede optar por buscar atencin mdica  en el consultorio de su doctor(a), en una clnica privada, en un centro de atencin urgente o en una sala de emergencias.  Si tiene Engineering geologist, por favor llame inmediatamente al 911 o vaya a la sala de emergencias.  Nmeros de bper  - Dr. Nehemiah Massed: [phone]  - Dra. Moye: [phone]  - Dra. Nicole Kindred: [phone]  En caso de inclemencias del [place], por favor llame a Johnsie Kindred principal al [phone]  para una actualizacin sobre el [place] de cualquier retraso o cierre.  Consejos para la medicacin en dermatologa: Por favor, guarde las cajas en las que vienen los medicamentos de uso tpico para ayudarle a seguir las instrucciones sobre dnde y cmo usarlos. Las  farmacias generalmente imprimen las instrucciones del medicamento slo en las cajas y no directamente en los tubos del [place].   Si su medicamento es muy caro, por favor, pngase en contacto con Zigmund Daniel llamando al [phone] y presione la opcin 4 o envenos un mensaje a travs de Pharmacist, community.   No podemos decirle cul ser su copago por los medicamentos por adelantado ya que esto es diferente dependiendo de la cobertura de su seguro. Sin embargo, es posible que podamos encontrar un medicamento sustituto a Electrical engineer un formulario para que el seguro cubra el medicamento que se considera necesario.   Si se requiere una autorizacin previa para que su compaa de seguros [place] su medicamento, por favor permtanos de 1 a 2 das hbiles para completar este proceso.  Los precios de los medicamentos varan con frecuencia dependiendo del Environmental consultant de dnde se surte la receta y alguna farmacias pueden ofrecer precios ms baratos.  El sitio web www.goodrx.com tiene cupones para medicamentos de Airline pilot. Los precios aqu no tienen en cuenta lo que podra costar con la ayuda del seguro (puede ser ms barato con su seguro), pero el sitio web puede darle el precio si no utiliz Research scientist (physical sciences).  - Puede imprimir el cupn correspondiente y llevarlo con su receta a la farmacia.  - Tambin puede pasar por nuestra oficina durante el horario de atencin regular y Charity fundraiser una tarjeta de cupones de GoodRx.  - Si necesita que su receta se enve electrnicamente a una farmacia diferente, informe a nuestra oficina a travs de MyChart de [place] o por telfono llamando al [phone] y presione la opcin 4.

## 2021-04-28 ENCOUNTER — Telehealth: Payer: Self-pay

## 2021-04-28 ENCOUNTER — Other Ambulatory Visit: Payer: Self-pay

## 2021-04-28 ENCOUNTER — Ambulatory Visit: Payer: PPO | Admitting: Dermatology

## 2021-04-28 DIAGNOSIS — Z86018 Personal history of other benign neoplasm: Secondary | ICD-10-CM

## 2021-04-28 DIAGNOSIS — C44519 Basal cell carcinoma of skin of other part of trunk: Secondary | ICD-10-CM | POA: Diagnosis not present

## 2021-04-28 DIAGNOSIS — L578 Other skin changes due to chronic exposure to nonionizing radiation: Secondary | ICD-10-CM | POA: Diagnosis not present

## 2021-04-28 NOTE — Telephone Encounter (Signed)
Called pt discussed biopsy results, pt will return to the office today 04/28/2021 at 2:00 pm for [facility] of superficial BCC  ?

## 2021-04-28 NOTE — Progress Notes (Signed)
? ?  Follow-Up Visit ?  ?Subjective  ?Frank Williamson is a 71 y.o. male who presents for the following: Procedure (Patient here today for [facility] at spinal upper back for bx proven BCC. ). ? ? ?The following portions of the chart were reviewed this encounter and updated as appropriate:  ?  ?  ? ?Review of Systems:  No other skin or systemic complaints except as noted in HPI or Assessment and Plan. ? ?Objective  ?Well appearing patient in no apparent distress; mood and affect are within normal limits. ? ?A focused examination was performed including back. Relevant physical exam findings are noted in the Assessment and Plan. ? ?spinal upper back ?Healing biopsy site ? ? ? ?Assessment & Plan  ?Basal cell carcinoma (BCC) of skin of other part of torso ?spinal upper back ? ?Destruction of lesion ? ?Destruction method: electrodesiccation and curettage   ?Informed consent: discussed and consent obtained   ?Timeout:  patient name, date of birth, surgical site, and procedure verified ?Anesthesia: the lesion was anesthetized in a standard fashion   ?Anesthetic:  1% lidocaine w/ epinephrine 1-100,000 buffered w/ 8.4% NaHCO3 ?Curettage performed in three different directions: Yes   ?Electrodesiccation performed over the curetted area: Yes   ?Final wound size (cm):  1.1 ?Hemostasis achieved with:  pressure, aluminum chloride and electrodesiccation ?Outcome: patient tolerated procedure well with no complications   ?Post-procedure details: wound care instructions given   ? ?History of Dysplastic Nevi ?- No evidence of recurrence today ?- Recommend regular full body skin exams ?- Recommend daily broad spectrum sunscreen SPF 30+ to sun-exposed areas, reapply every 2 hours as needed.  ?- Call if any new or changing lesions are noted between office visits ? ?Actinic Damage ?- chronic, secondary to cumulative UV radiation exposure/sun exposure over time ?- diffuse scaly erythematous macules with underlying dyspigmentation ?- Recommend  daily broad spectrum sunscreen SPF 30+ to sun-exposed areas, reapply every 2 hours as needed.  ?- Recommend staying in the shade or wearing long sleeves, sun glasses (UVA+UVB protection) and wide brim hats (4-inch brim around the entire circumference of the hat). ?- Call for new or changing lesions.  ? ?Return for TBSE, as scheduled. ? ?Graciella Belton, RMA, am acting as scribe for Brendolyn Patty, MD . ? ?Documentation: I have reviewed the above documentation for accuracy and completeness, and I agree with the above. ? ?Brendolyn Patty MD  ? ?

## 2021-04-28 NOTE — Patient Instructions (Signed)

## 2021-04-28 NOTE — Telephone Encounter (Signed)
LM on VM please return my call to discuss ?Biopsy results  ? ?SUPERFICIAL BASAL CELL CARCINOMA ?  ?BCC skin cancer, needs EDC  ?

## 2021-04-28 NOTE — Telephone Encounter (Signed)
-----   Message from Brendolyn Patty, MD sent at 04/25/2021  3:17 PM EST ----- ?Skin , spinal upper back ?SUPERFICIAL BASAL CELL CARCINOMA ? ?BCC skin cancer, needs EDC - please call patient ?

## 2021-06-02 DIAGNOSIS — E7800 Pure hypercholesterolemia, unspecified: Secondary | ICD-10-CM | POA: Diagnosis not present

## 2021-06-02 DIAGNOSIS — E039 Hypothyroidism, unspecified: Secondary | ICD-10-CM | POA: Diagnosis not present

## 2021-07-16 ENCOUNTER — Encounter: Admit: 2021-07-16 | Payer: PRIVATE HEALTH INSURANCE | Primary: Internal Medicine

## 2021-07-16 ENCOUNTER — Telehealth: Admit: 2021-07-16 | Payer: PRIVATE HEALTH INSURANCE | Primary: Internal Medicine

## 2021-07-16 DIAGNOSIS — C4491 Basal cell carcinoma of skin, unspecified: Secondary | ICD-10-CM

## 2021-07-16 DIAGNOSIS — E782 Mixed hyperlipidemia: Secondary | ICD-10-CM

## 2021-07-16 DIAGNOSIS — I119 Hypertensive heart disease without heart failure: Secondary | ICD-10-CM

## 2021-07-16 DIAGNOSIS — C931 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: Secondary | ICD-10-CM

## 2021-07-16 DIAGNOSIS — C9110 Chronic lymphocytic leukemia of B-cell type not having achieved remission: Secondary | ICD-10-CM

## 2021-07-16 DIAGNOSIS — I10 Essential (primary) hypertension: Secondary | ICD-10-CM

## 2021-07-16 DIAGNOSIS — Z8249 Family history of ischemic heart disease and other diseases of the circulatory system: Secondary | ICD-10-CM

## 2021-07-16 DIAGNOSIS — N400 Enlarged prostate without lower urinary tract symptoms: Secondary | ICD-10-CM

## 2021-07-16 DIAGNOSIS — H409 Unspecified glaucoma: Secondary | ICD-10-CM

## 2021-07-16 DIAGNOSIS — F32A Depression: Secondary | ICD-10-CM

## 2021-07-16 DIAGNOSIS — F419 Anxiety disorder, unspecified: Secondary | ICD-10-CM

## 2021-07-16 DIAGNOSIS — R0609 Other forms of dyspnea: Secondary | ICD-10-CM

## 2021-07-17 NOTE — Telephone Encounter
DX:  CLL/CMLTelephone call made to patient prior to consult appointment that is being  Scheduled.  .    Introduced myself and explained my role.  We discussed what to expect at the first visit. Reviewed directions and parking instructions. Allergy information was reviewed and updated. Pt was instructed to bring current medication list on the day of the visit for review.    Office contact information provided and was encouraged to call for any future questions or concerns. Initial nursing assessment initiated. Pt is a fall risk. Had a fall 5 months ago when he was knocked over by a very large dog. Fall prevention education reviewed.  Will need fall alert bracelet applied on the day of the visit. Ramari Bray is a 71 yo gentlemen that lives with is wife in [place].  He is currently spending the summer in [place] visiting family.  He has 2 daughters and 5 grandchildren.  Retired, having worked for [employer].  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  He is a social drinker of 1-2 beers per week, denies illicit drug use and is a non smoker.  Covid-19 vaccine series completed.  He will bring the vaccine card on the day of the visit to update Epic. Personal  history of Covid-19 1/2022History of anxiety/depression.   The patient denied any suicidal ideation, intent, or plan during this assessment

## 2021-08-05 ENCOUNTER — Inpatient Hospital Stay: Admit: 2021-08-05 | Discharge: 2021-08-05 | Payer: MEDICARE

## 2021-08-05 ENCOUNTER — Encounter: Admit: 2021-08-05 | Payer: PRIVATE HEALTH INSURANCE | Primary: Internal Medicine

## 2021-08-05 ENCOUNTER — Ambulatory Visit: Admit: 2021-08-05 | Payer: MEDICARE | Attending: Hematology & Oncology | Primary: Internal Medicine

## 2021-08-05 DIAGNOSIS — D509 Iron deficiency anemia, unspecified: Secondary | ICD-10-CM

## 2021-08-05 DIAGNOSIS — C931 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: Secondary | ICD-10-CM

## 2021-08-05 DIAGNOSIS — Z01818 Encounter for other preprocedural examination: Secondary | ICD-10-CM

## 2021-08-05 DIAGNOSIS — R233 Spontaneous ecchymoses: Secondary | ICD-10-CM

## 2021-08-05 LAB — MANUAL DIFFERENTIAL
BKR WAM ABSOLUTE NRBC (2 DEC): 0.9 % — ABNORMAL HIGH (ref 0.0–0.0)
BKR WAM BASOPHIL - ABS (DIFF) 2 DEC: 0.06 x 1000/??L (ref 0.00–1.00)
BKR WAM BASOPHILS (DIFF): 1.7 % — ABNORMAL HIGH (ref 0.0–1.4)
BKR WAM EOSINOPHILS (DIFF) 2 DEC: 0.03 x 1000/??L (ref 0.00–1.00)
BKR WAM EOSINOPHILS (DIFF): 0.9 % (ref 0.0–5.0)
BKR WAM LYMPHOCYTE - ABS (DIFF) 2 DEC: 1.12 x 1000/??L (ref 0.60–3.70)
BKR WAM LYMPHOCYTES (DIFF): 33.9 % (ref 17.0–50.0)
BKR WAM MONOCYTE - ABS (DIFF) 2 DEC: 0.49 x 1000/ÂµL (ref 0.00–1.00)
BKR WAM MONOCYTES (DIFF): 14.8 % — ABNORMAL HIGH (ref 4.0–12.0)
BKR WAM MYELOCYTES (DIFF) 1 DEC: 0.9 % — ABNORMAL HIGH (ref 0.0–0.0)
BKR WAM NEUTROPHILS (DIFF): 47.8 % (ref 39.0–72.0)
BKR WAM NEUTROPHILS - ABS (DIFF) 2 DEC: 1.58 x 1000/??L — ABNORMAL LOW (ref 2.00–7.60)

## 2021-08-05 LAB — COMPREHENSIVE METABOLIC PANEL
BKR A/G RATIO: 1.5 (ref 1.0–2.2)
BKR ALANINE AMINOTRANSFERASE (ALT): 34 U/L (ref 9–59)
BKR ALBUMIN: 4.6 g/dL (ref 3.6–4.9)
BKR ALKALINE PHOSPHATASE: 77 U/L (ref 9–122)
BKR ANION GAP: 10 (ref 7–17)
BKR ASPARTATE AMINOTRANSFERASE (AST): 34 U/L (ref 10–35)
BKR AST/ALT RATIO: 1
BKR BILIRUBIN TOTAL: 0.7 mg/dL (ref 0.00–<=1.2)
BKR BLOOD UREA NITROGEN: 17 mg/dL (ref 8–23)
BKR BUN / CREAT RATIO: 18.7 (ref 8.0–23.0)
BKR CALCIUM: 9.6 mg/dL (ref 8.8–10.2)
BKR CHLORIDE: 107 mmol/L (ref 98–107)
BKR CO2: 25 mmol/L (ref 20–30)
BKR CREATININE: 0.91 mg/dL (ref 0.40–1.30)
BKR EGFR, CREATININE (CKD-EPI 2021): >60 mL/min/{1.73_m2} (ref >=60)
BKR GLOBULIN: 3 g/dL (ref 2.3–3.5)
BKR GLUCOSE: 109 mg/dL — ABNORMAL HIGH (ref 70–100)
BKR POTASSIUM: 4.5 mmol/L (ref 3.3–5.3)
BKR PROTEIN TOTAL: 7.6 g/dL (ref 6.6–8.7)
BKR SODIUM: 142 mmol/L (ref 136–144)

## 2021-08-05 LAB — CBC WITH AUTO DIFFERENTIAL
BKR WAM HEMATOCRIT (2 DEC): 41.1 % (ref 38.50–50.00)
BKR WAM HEMOGLOBIN: 13.2 g/dL (ref 13.2–17.1)
BKR WAM MCH (PG): 27.6 pg (ref 27.0–33.0)
BKR WAM MCHC: 32.1 g/dL (ref 31.0–36.0)
BKR WAM MCV: 85.8 fL (ref 80.0–100.0)
BKR WAM NUCLEATED RED BLOOD CELLS: 0 % (ref 0.0–1.0)
BKR WAM PLATELETS: 140 x1000/ÂµL — ABNORMAL LOW (ref 150–420)
BKR WAM RDW-CV: 27.2 % — ABNORMAL HIGH (ref 11.0–15.0)
BKR WAM RED BLOOD CELL COUNT.: 4.79 M/??L (ref 4.00–6.00)
BKR WAM WHITE BLOOD CELL COUNT: 3.3 x1000/ÂµL — ABNORMAL LOW (ref 4.0–11.0)

## 2021-08-05 LAB — FERRITIN: BKR FERRITIN: 100 ng/mL (ref 30–400)

## 2021-08-05 LAB — PT/INR AND PTT (BH GH L LMW YH)
BKR INR: 1.12 (ref 0.92–1.19)
BKR PARTIAL THROMBOPLASTIN TIME: 25.8 seconds — ABNORMAL LOW (ref 23.0–31.4)
BKR PROTHROMBIN TIME: 11.6 seconds (ref 9.6–12.3)

## 2021-08-05 MED ORDER — MAGNESIUM OXIDE 400 MG (241.3 MG MAGNESIUM) TABLET
400 mg (241.3 mg magnesium) | ORAL | Status: AC
Start: 2021-08-05 — End: ?

## 2021-08-05 MED ORDER — LISINOPRIL 40 MG TABLET
40 mg | Status: AC
Start: 2021-08-05 — End: ?

## 2021-08-05 MED ORDER — COENZYME Q10 300 MG CAPSULE
300 mg | ORAL | Status: AC
Start: 2021-08-05 — End: ?

## 2021-08-05 MED ORDER — CHOLECALCIFEROL (VITAMIN D3) 50 MCG (2,000 UNIT) CAPSULE
50 mcg (2,000 unit) | Freq: Every day | ORAL | Status: AC
Start: 2021-08-05 — End: ?

## 2021-08-05 MED ORDER — AMLODIPINE 10 MG TABLET
10 mg | ORAL | Status: AC
Start: 2021-08-05 — End: ?

## 2021-08-05 MED ORDER — FINASTERIDE 5 MG TABLET
5 mg | Status: AC
Start: 2021-08-05 — End: ?

## 2021-08-05 MED ORDER — ATORVASTATIN 40 MG TABLET
40 mg | Status: AC
Start: 2021-08-05 — End: ?

## 2021-08-05 MED ORDER — ASPIRIN 81 MG TABLET,DELAYED RELEASE
81 mg | ORAL | Status: AC
Start: 2021-08-05 — End: ?

## 2021-08-05 MED ORDER — METOPROLOL SUCCINATE ER 50 MG TABLET,EXTENDED RELEASE 24 HR
50 mg | ORAL | Status: AC
Start: 2021-08-05 — End: ?

## 2021-08-05 NOTE — Progress Notes
DX:  CLL/CMLMD:  Dr. Sherrell Puller Met with patient and family at initial  consult. Allergies reviewed and updated. Initial nursing assessment initiated and completed.  New patient book reviewed with patient and family. Pt and family stated understanding information and denied questions.  Office contact information provided and they were encouraged to call for any future questions or concerns. Distress score is 4.  Social work referral made. Pt is a fall risk. Had a fall 5 months ago when he was knocked over by a very large dog. Fall prevention education reviewed. Fall alert bracelet applied ?Douglas [name] is a 71 yo gentlemen that lives with is wife in [place].  He is currently spending the summer in [place] visiting family.  He has 2 daughters and 5 grandchildren.  Retired, having worked for [employer].  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  He is a social drinker of 1-2 beers per week, denies illicit drug use and is a non smoker.  Covid-19 vaccine series completed.  He will bring the vaccine card on the day of the visit to update Epic. Personal  history of Covid-19 02/2020?History of anxiety/depression.  ?The patient denied any suicidal ideation, intent, or plan during this assessment Had recent derm biopsy done behind left ear that is confirmed skin cancer. ?

## 2021-08-05 NOTE — Progress Notes
Medical Social Work Assessment Adult  Loss adjuster, chartered Most Recent Value Rendered Accommodations (Leave blank if none rendered or patient/family supplied their own hearing devices/glasses)  Other language interpreter used (non-ASL)? No Admission Information  Document Type Clinical Assessment - Able to Assess Reason for Current Social Work Involvement Support/Coping Source of Information Patient Record Reviewed Yes Level of Care Ambulatory Relationships  Marital Status Married Lives With Spouse Family circumstances lives with wife Informal Supports (family, friends, church, Catering manager) family and friends Formal Supports (current community resources and providers) hematology Pertinent spiritual or cultural factors no concerns reported Relationship Comments no concerns reported Abuse Screen (yes response referral indicated)  Able to respond to abuse questions Yes Do you Feel That You Are Treated Well By Your Partner/Spouse/Family Member/Caregiver/Employer?  yes What Happens When You Argue/Fight With Your Partner/Spouse/Family Member? no concerns reported Feels Unsafe at Home or Work/School no Feels Threatened by Someone no Does Anyone Try to Keep You From Having Contact with Others or Doing Things Outside Your Home? no Do you have concerns regarding someone you know having access to your MyChart account? no Language needed None, Patient Speaks English Literacy Read/write independently Special Needs no concerns reported Social Determinants of Health  Financial Concerns None Vocational and employment status and history retired What is your living situation today? I have a steady place to live Think about the place you live. Do you have problems with any of the following? None How hard is it for you to pay for the very basics like food, housing, medical care, and heating? Not very Within the past 12 months, you worried that your food would run out before you got the money to buy more. Never true Within the past 12 months, the food you bought just didn't last and you didn't have money to get more. Never true In the past 12 months, has lack of transportation kept you from medical appointments or from getting medications? no In the past 12 months, has lack of transportation kept you from meetings, work, or from getting things needed for daily living? No Housing/Transportation/Environmental Comment (use soup kitchens, food pantries, eviction pending, unable to afford) no concerns reported Mental Status  Mental Status Able to Assess Appearance well-groomed Attitude/Demeanor/Rapport appropriate to circumstances, cooperative Affect (typically observed) accepting, adaptable Orientation no deficits recognized Insight Good Health Insight/Judgment no deficits recognized Reaction to Event/Health Status Accepting, Adjusting, Anxious, Motivated, Hopeful, Realistic Suicide Risk Assessment  Reason for Assessment Utilizing SAFE-T and C-SSRS (Check all that apply) Social Work Consult/Assessment C-SSRS Able to Assess Screening for suicidal ideation within Past Month In the past month have you wished you were dead or wished you could go to sleep and not wake up? no In the past month have you actually had any thoughts of killing yourself? no Have you ever done anything, started to do anything, or prepared to do anything to end your life? no Preparation Comment no concerns reported Past 3 months Comment no concerns reported [place] Suicide Risk Level low risk Specific Questions about Thoughts, Plans, Suicidal Intent (SAFE-T) Negative responses above do not indicate a need for SAFE-T assessment Risk Assessment  Access to Lethal Methods?  (firearm in home or access/presence of other lethal methods) Unable to assess Risk Assessment Able to Assess Risk to Self Able to Assess Risk to Self - Self-Injurious Behavior None identified Attitudes regarding Self-Injury None disclosed Imminent Risk for Self-Injury in Community Low Imminent Risk for Self-Injury in Facility Low Risk to Others Able to Assess Risk to Others None  Disclosed Attitude regarding Aggression / Violence None Disclosed Imminent Risk for Violence in Community Low Imminent Risk for Violence in Facility Low Current and Past Psychiatric Diagnoses Able to Assess Mood Disorder No Anxiety Disorder Recurrent/Current Psychotic Disorder Defer for Further Assessment Substance Use Disorder Defer for Further Assessment Post-Traumatic Stress Disorder (PTSD) Defer for Further Assessment Attention Deficit/Hyperactivity Disorder (ADHD) Defer for Further Assessment Traumatic Brain Injury (TBI) Defer for Further Assessment 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, Antisocial, Histrionic & Narcissistic) Defer for Further Assessment Conduct Problems (Antisocial Behavior, Aggression, Impulsivity) Defer for Further Assessment Other no concerns reported Suicide Attempt No Prior Attempts Presenting Symptoms None Family History None reported Precipitants/Stressors None Identified Change in Mental Health or Substance Use Disorder Treatment Not applicable General Risk Factors Recent stress General Protective Factors history of adaptive functioning, cooperative with interview, able to express feelings, able to define needs, receptive to change, able to engage others, identifies reasons for living, future oriented, frustration tolerance, positive coping skills, able to live independently, responsibility to others, responsibility to pets, positive social supports committed and able to help, cultural, spiritual and/or moral attitudes against suicide This patient was screened using the [place] Suicide Severity Rating Scale (CSSRS)  Yes I conducted a suicide risk assessment including a suicide inquiry and assessment of risk and protective factors, as recommended by the standard Suicide Assessment Five-Step Evaluation and Triage (SAFE-T) for Mental Health Professionals. No, the C-SSRS did not produce a positive screen Cause for concern None Based on my assessment, the level of risk for this patient to suicide in an inpatient or emergency setting is:  MINIMAL because the patient does not present with suicidal ideation, does not have a history of suicide attempts, and the balance of protective factors outweighs any current risk factors Based on my assessment, the level of risk for this patient to suicide in the community is:  MINIMAL Recommended Next Steps Remain in/Return to Community Remain in/Return to [employer] factors (see above) Substance Use  Active substance use No Coping  Reaction to Event/Health Status Accepting, Adjusting, Anxious, Motivated, Hopeful, Realistic Formulation: Recommendation(s) and Intervention(s) (including for discharge to occur)  Psychosocial issues requiring intervention referral from medical team, distress screen Psychosocial interventions 20 minutes face to face with Mr. & Mrs. Grau who have been married 46 years. They lives in [place] and came to [place] to visit their grandchildren for a month. While here, he is establishing care since he has the support system here. He shared some lifelong anxiety related to issues like work (now retired), household tasks, his puppy (69 year old labradoodle Tiffin). He does to feel the need for a mental health provider, he said, and his wife said she keeps him calm with her positive attitude. I provided empathic listening, psychoeducation and support and told them about support groups and integrative services.. They said there are no psychosocial concerns and thanked me for the visit. [facility], Natale Lay, RN Specific referrals to enhance community supports (include existing and new resources) support groups, integrative services Handoff Required? No Next Steps/Plan (including hand-off): Social work intervention complete. Please reconsult social work if needed. Signature: Huntley Estelle, LCSW Contact Information: [phone]

## 2021-08-05 NOTE — Progress Notes
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 He presents to establish hematologic malignancies care given that his daughter lives in [place]. He resides in [place] and is under the care of Dr. Sofie [name] of [facility] in [place]. He established care with Dr. Mel [name] 05/14/21. He has been under observation for the CMML. Around February, his Hb was found to be 5 for which he received 2U of PRBC. He had had no viral syndrome prior. 3-4 weeks prior he had sustained a bad fall in which his LLE became significantly swollen. He was to have a [place] LLE which did not occur. There is a question of whether he could have bled into his leg. At the time he became progressively fatigued and SOB. He had a cardiac evaluation including a pharmacologic stress test which was reported to him as normal. He did not have endoscopies; last colonoscopy was in 2021. He was found to be iron deficient and his Hb responded to IV iron rising to 12.2 in 12/2020. A bone marrow biopsy and aspiration was repeat 05/16/21 and reported to be stable. 07/08/21 he saw Dr. Mendel [name] at [facility] who recommended deferring allogeneic SCT until the time of progression and HMA until the time of critical cytopenias.[name] presents with his wife.They tell me he is scheduled to have a Mohs for skin cancer that was diagnosed on his left ear.His daughter lives in [place] and he and his wife are thinking that if and when he needs allogeneic SCT he could have it done here because of the support and ability to reside close to the transplant center.At baseline he is chronically tired, sleeps very poorly; has been recommended to have sleep test.He is not limited in his activities; walks once a day for 20 minutes.In [place] he was bicycling and walking up to 3-4X/day.No fever, sweats, bone pain, rashes, no pruritus.Good appetite. No weight loss.He is here until July 5th.Pathology:- Reports are not available.05/16/2021 Bone marrow biopsyMyeloid neoplsm without excess blasts50%cellular marrow with dysmegakaryopoiesis and monocytic hyperplasiaAcellular aspirateNo excess fibrosis on reticulin stainingFlow without increased blasts; increased monocytes with normal phenotype46XX [?]ASXL1, TET2 and [name] mutated2021 marrow results unavailablePMH:?CLLHypertensionHyperlipidemiaGlaucomaBasal cell carcinoma, foreheadProstate hypertrophyDepressionAnxietyMedications: Reviewed; as listed plus latanoprostNo Known Allergies FH: No history of hematologic malignanciesFather - deceased at 64 of complications of metastatic prostate cancerMother - decease in her 69s of metastatic breast cancerSister/Douglas [name] - breast cancer in her 30s, 66Sister - in good health, 63Daughter - type I DMDaughter - good healthSH: He lives with his wife. He retired from [employer] in 2009. Moved from [facility] to [place] area in 2023. Never smoker. 1-2 beers a week. Bicycling and gardening as hobies.Physical Exam:Vitals:  08/05/21 1114 BP: (!) 153/81 Pulse: 72 Resp: 19 Temp: 98 ?F (36.7 ?C) General: Well-appearing man in NAD.HEENT: NC/AT, anicteric sclera, MMM, OP clearCV: RRR, nl s1/s2, no m/r/gLungs: CTA BLAbdomen: nondistended, obese, soft, no ttp, no masses or organomegaly appreciatedExtremities: bilateral pedal edemaNeurologic: A&OX3, grossly non-focalSkin: erythema of bilateral feet; posterior aspect of the L ear with c/d/i scabLabs:PendingAssessment and Plan:Douglas [name] is a 71 year old man with a history of depression, anxiety, iron deficiency anemia, CLL, CMML (diagnosed 12/2019).# CMML/myelodysplastic typeRisk stratification by CPSS-Mol CPSS-Mol Score: 1, Genetic Risk Group: Intermediate-1Prognosis: Intermediate-1 risk CPSS-Mol risk group: Individuals with this risk profile have a  low risk of future AML progression (cumulative incidence 3% at 59-months) and a median survival of 64 months.Answers calculated to formulate result:1. Cytogenetics -- Low (normal and -Y)2. ASXL1 -- Mutated3. NRAS -- Unmutated4. RUNX1 -- Unmutated 5. SETBP1 -- Unmutated6. BM blasts -- <5%7. WBC -- <13 x 10?/L8. Transfusions -- No- Notably the last bone marrow aspirate was aspicular and thus there should be a low threshold for repeat marrow for any clinical change.- Will check CBC and differential today give upcoming surgery on Friday.- I am unable to see the results from [facility] to trend his blood counts over the years, but assuming overall stability, in combination with excellent clinical status agree with continuing observation.- He is aware that transplant is the only potentially curative treatment for CMML, but is a high risk intervention that is not currently recommended. He has had a transplant consultation at [facility], but is currently thinking that he would want to have his transplant here if needed. Has not had HLA typing.# CLLWe did not discuss this today and will request the peripheral blood flow cytometry as well as the report of his last bone marrow biopsy and aspiration (ideally the slides as well) from [facility] and the BM bx from 2021 from [facility].Per his last CBC and differential 07/08/21 from [facility] in [place] he had lymphopenia not lymphocytosis.# Iron deficiency anemia- Check ferritin today- Discussed the role of upper endoscopy to completed w/u for iron deficiency anemia; per report c-scope within past couple of years.	Also discussed that capsule endoscopy is a consideration.# Anti-microbial ProphylaxisHSV/VZV: Consider Acylovir 400 mg PO bidBacterial: Fluoroquinolone for ANC<500Fungal: broad azole for ANC<500# AnxietyManaged with non-pharmacologic methods.Return to clinic as needed when next in [place].L. Sherrell Puller, MD PhD

## 2021-08-06 ENCOUNTER — Encounter: Admit: 2021-08-06 | Payer: PRIVATE HEALTH INSURANCE | Attending: Hematology & Oncology | Primary: Internal Medicine

## 2021-08-27 DIAGNOSIS — Z79899 Other long term (current) drug therapy: Secondary | ICD-10-CM | POA: Diagnosis not present

## 2021-08-27 DIAGNOSIS — I10 Essential (primary) hypertension: Secondary | ICD-10-CM | POA: Diagnosis not present

## 2021-08-27 DIAGNOSIS — E039 Hypothyroidism, unspecified: Secondary | ICD-10-CM | POA: Diagnosis not present

## 2021-08-27 DIAGNOSIS — K2100 Gastro-esophageal reflux disease with esophagitis, without bleeding: Secondary | ICD-10-CM | POA: Diagnosis not present

## 2021-08-27 DIAGNOSIS — Z0000 Encounter for general adult medical examination without abnormal findings: Secondary | ICD-10-CM | POA: Diagnosis not present

## 2021-08-27 DIAGNOSIS — E7800 Pure hypercholesterolemia, unspecified: Secondary | ICD-10-CM | POA: Diagnosis not present

## 2021-09-04 DIAGNOSIS — E039 Hypothyroidism, unspecified: Secondary | ICD-10-CM | POA: Diagnosis not present

## 2021-09-04 DIAGNOSIS — I10 Essential (primary) hypertension: Secondary | ICD-10-CM | POA: Diagnosis not present

## 2021-09-04 DIAGNOSIS — Z79899 Other long term (current) drug therapy: Secondary | ICD-10-CM | POA: Diagnosis not present

## 2021-09-04 DIAGNOSIS — E7800 Pure hypercholesterolemia, unspecified: Secondary | ICD-10-CM | POA: Diagnosis not present

## 2021-09-19 ENCOUNTER — Encounter: Admit: 2021-09-19 | Payer: PRIVATE HEALTH INSURANCE | Primary: Internal Medicine

## 2021-09-19 NOTE — Progress Notes
Medical Social Work Follow Up  [employer] Most Recent Value Admission Information  Document Type Progress Note Source of Information Patient Record Reviewed Yes Level of Care Ambulatory Psychosocial issues requiring intervention follow up Psychosocial interventions 15 minutes by phone with Douglas [name] who I met in June. He shared that he and his wife and the puppy are now back in [place] and that he is coping well. He said he has no need for a mental heath provier. He thanked me for the call. [facility] Specific referrals to enhance community supports (include existing and new resources) not applicable Handoff Required? No Next Steps/Plan (including hand-off): Social work intervention complete. Please reconsult social work ifneeded. Signature: Huntley Estelle, LCSW Contact Information: [phone]

## 2021-12-01 DIAGNOSIS — K219 Gastro-esophageal reflux disease without esophagitis: Secondary | ICD-10-CM | POA: Diagnosis not present

## 2021-12-01 DIAGNOSIS — K220 Achalasia of cardia: Secondary | ICD-10-CM | POA: Diagnosis not present

## 2021-12-01 DIAGNOSIS — Z9889 Other specified postprocedural states: Secondary | ICD-10-CM | POA: Diagnosis not present

## 2022-01-20 DIAGNOSIS — J208 Acute bronchitis due to other specified organisms: Secondary | ICD-10-CM | POA: Diagnosis not present

## 2022-01-20 DIAGNOSIS — I10 Essential (primary) hypertension: Secondary | ICD-10-CM | POA: Diagnosis not present

## 2022-01-20 DIAGNOSIS — U071 COVID-19: Secondary | ICD-10-CM | POA: Diagnosis not present

## 2022-02-03 DIAGNOSIS — Z03818 Encounter for observation for suspected exposure to other biological agents ruled out: Secondary | ICD-10-CM | POA: Diagnosis not present

## 2022-02-27 DIAGNOSIS — E039 Hypothyroidism, unspecified: Secondary | ICD-10-CM | POA: Diagnosis not present

## 2022-02-27 DIAGNOSIS — E7800 Pure hypercholesterolemia, unspecified: Secondary | ICD-10-CM | POA: Diagnosis not present

## 2022-02-27 DIAGNOSIS — K219 Gastro-esophageal reflux disease without esophagitis: Secondary | ICD-10-CM | POA: Diagnosis not present

## 2022-02-27 DIAGNOSIS — Z125 Encounter for screening for malignant neoplasm of prostate: Secondary | ICD-10-CM | POA: Diagnosis not present

## 2022-02-27 DIAGNOSIS — Z79899 Other long term (current) drug therapy: Secondary | ICD-10-CM | POA: Diagnosis not present

## 2022-02-27 DIAGNOSIS — I10 Essential (primary) hypertension: Secondary | ICD-10-CM | POA: Diagnosis not present

## 2022-04-02 DIAGNOSIS — D649 Anemia, unspecified: Secondary | ICD-10-CM | POA: Diagnosis not present

## 2022-04-28 ENCOUNTER — Encounter: Payer: Self-pay | Admitting: Dermatology

## 2022-04-28 ENCOUNTER — Ambulatory Visit: Payer: PPO | Admitting: Dermatology

## 2022-04-28 VITALS — BP 114/65 | HR 86

## 2022-04-28 DIAGNOSIS — D225 Melanocytic nevi of trunk: Secondary | ICD-10-CM

## 2022-04-28 DIAGNOSIS — Z1283 Encounter for screening for malignant neoplasm of skin: Secondary | ICD-10-CM

## 2022-04-28 DIAGNOSIS — Z85828 Personal history of other malignant neoplasm of skin: Secondary | ICD-10-CM | POA: Diagnosis not present

## 2022-04-28 DIAGNOSIS — D229 Melanocytic nevi, unspecified: Secondary | ICD-10-CM

## 2022-04-28 DIAGNOSIS — L299 Pruritus, unspecified: Secondary | ICD-10-CM | POA: Diagnosis not present

## 2022-04-28 DIAGNOSIS — Z86018 Personal history of other benign neoplasm: Secondary | ICD-10-CM

## 2022-04-28 DIAGNOSIS — L821 Other seborrheic keratosis: Secondary | ICD-10-CM

## 2022-04-28 DIAGNOSIS — D1801 Hemangioma of skin and subcutaneous tissue: Secondary | ICD-10-CM

## 2022-04-28 DIAGNOSIS — L578 Other skin changes due to chronic exposure to nonionizing radiation: Secondary | ICD-10-CM

## 2022-04-28 DIAGNOSIS — L814 Other melanin hyperpigmentation: Secondary | ICD-10-CM | POA: Diagnosis not present

## 2022-04-28 DIAGNOSIS — L820 Inflamed seborrheic keratosis: Secondary | ICD-10-CM | POA: Diagnosis not present

## 2022-04-28 NOTE — Progress Notes (Signed)
Follow-Up Visit   Subjective  Frank Williamson is a 72 y.o. male who presents for the following: Annual Exam.  The patient presents for Upper Body Skin Exam (UBSE) for skin cancer screening and mole check.  The patient has spots, moles and lesions to be evaluated, some may be new or changing. He has an itchy spot on his back and a rough spot on his scalp to check today.  History of BCC of the spinal upper back. 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   The following portions of the chart were reviewed this encounter and updated as appropriate:       Review of Systems:  No other skin or systemic complaints except as noted in HPI or Assessment and Plan.  Objective  Well appearing patient in no apparent distress; mood and affect are within normal limits.  A focused examination was performed including all skin waist up. Relevant physical exam findings are noted in the Assessment and Plan.  Right Medial Scapula  Linear scar, otherwise clear  vertex Erythematous stuck-on, waxy papule  spinal upper back 4.0 MM speckled brown macule  left abdomen 1.0 CM med dark brown macule with light tan rim           Assessment & Plan  Pruritus Right Medial Scapula  2ndary to scar.  Discussed topical steroid, patient defers today. Continue CeraVe Cream prn itch.    Inflamed seborrheic keratosis vertex  vs Hypertrophic AK  Symptomatic, irritating, patient would like treated.  Destruction of lesion - vertex  Destruction method: cryotherapy   Informed consent: discussed and consent obtained   Lesion destroyed using liquid nitrogen: Yes   Region frozen until ice ball extended beyond lesion: Yes   Outcome: patient tolerated procedure well with no complications   Post-procedure details: wound care instructions given   Additional details:  Prior to procedure, discussed risks of blister formation, small wound, skin dyspigmentation, or  rare scar following cryotherapy. Recommend Vaseline ointment to treated areas while healing.   Nevus (2) left abdomen; spinal upper back  Benign-appearing.  Observation.  Call clinic for new or changing moles.  Recommend daily use of broad spectrum spf 30+ sunscreen to sun-exposed areas.   Skin cancer screening performed today.  Actinic Damage - chronic, secondary to cumulative UV radiation exposure/sun exposure over time - diffuse scaly erythematous macules with underlying dyspigmentation - Recommend daily broad spectrum sunscreen SPF 30+ to sun-exposed areas, reapply every 2 hours as needed.  - Recommend staying in the shade or wearing long sleeves, sun glasses (UVA+UVB protection) and wide brim hats (4-inch brim around the entire circumference of the hat). - Call for new or changing lesions.  History of Basal Cell Carcinoma of the Skin - No evidence of recurrence today of the spinal upper back - Recommend regular full body skin exams - Recommend daily broad spectrum sunscreen SPF 30+ to sun-exposed areas, reapply every 2 hours as needed.  - Call if any new or changing lesions are noted between office visits  History of Dysplastic Nevi - No evidence of recurrence today of the right medial scapula and right spinal mid back - Recommend regular full body skin exams - Recommend daily broad spectrum sunscreen SPF 30+ to sun-exposed areas, reapply every 2 hours as needed.  - Call if any new or changing lesions are noted between office visits  Lentigines - Scattered tan macules - Due to sun exposure - Benign-appearing, observe - Recommend  daily broad spectrum sunscreen SPF 30+ to sun-exposed areas, reapply every 2 hours as needed. - Call for any changes  Seborrheic Keratoses - Stuck-on, waxy, tan-brown papules and/or plaques  - Benign-appearing - Discussed benign etiology and prognosis. - Observe - Call for any changes  Melanocytic Nevi - Tan-brown and/or pink-flesh-colored  symmetric macules and papules - Benign appearing on exam today - Observation - Call clinic for new or changing moles - Recommend daily use of broad spectrum spf 30+ sunscreen to sun-exposed areas.   Hemangiomas - Red papules - Discussed benign nature - Observe - Call for any changes  Return in about 1 year (around 04/28/2023) for UBSE, Hx BCC, Hx Dysplastic Nevus.  IJamesetta Orleans, CMA, am acting as scribe for Brendolyn Patty, MD .  Documentation: I have reviewed the above documentation for accuracy and completeness, and I agree with the above.  Brendolyn Patty MD

## 2022-04-28 NOTE — Patient Instructions (Addendum)
Cryotherapy Aftercare  Wash gently with soap and water everyday.   Apply Vaseline and Band-Aid daily until healed.   Melanoma ABCDEs  Melanoma is the most dangerous type of skin cancer, and is the leading cause of death from skin disease.  You are more likely to develop melanoma if you: Have light-colored skin, light-colored eyes, or red or blond hair Spend a lot of time in the sun Tan regularly, either outdoors or in a tanning bed Have had blistering sunburns, especially during childhood Have a close family member who has had a melanoma Have atypical moles or large birthmarks  Early detection of melanoma is key since treatment is typically straightforward and cure rates are extremely high if we catch it early.   The first sign of melanoma is often a change in a mole or a new dark spot.  The ABCDE system is a way of remembering the signs of melanoma.  A for asymmetry:  The two halves do not match. B for border:  The edges of the growth are irregular. C for color:  A mixture of colors are present instead of an even brown color. D for diameter:  Melanomas are usually (but not always) greater than 6mm - the size of a pencil eraser. E for evolution:  The spot keeps changing in size, shape, and color.  Please check your skin once per month between visits. You can use a small mirror in front and a large mirror behind you to keep an eye on the back side or your body.   If you see any new or changing lesions before your next follow-up, please call to schedule a visit.  Please continue daily skin protection including broad spectrum sunscreen SPF 30+ to sun-exposed areas, reapplying every 2 hours as needed when you're outdoors.   Staying in the shade or wearing long sleeves, sun glasses (UVA+UVB protection) and wide brim hats (4-inch brim around the entire circumference of the hat) are also recommended for sun protection.      Due to recent changes in healthcare laws, you may see results of  your pathology and/or laboratory studies on MyChart before the doctors have had a chance to review them. We understand that in some cases there may be results that are confusing or concerning to you. Please understand that not all results are received at the same time and often the doctors may need to interpret multiple results in order to provide you with the best plan of care or course of treatment. Therefore, we ask that you please give us 2 business days to thoroughly review all your results before contacting the office for clarification. Should we see a critical lab result, you will be contacted sooner.   If You Need Anything After Your Visit  If you have any questions or concerns for your doctor, please call our main line at [phone] and press option 4 to reach your doctor's medical assistant. If no one answers, please leave a voicemail as directed and we will return your call as soon as possible. Messages left after 4 pm will be answered the following business day.   You may also send us a message via MyChart. We typically respond to MyChart messages within 1-2 business days.  For prescription refills, please ask your pharmacy to contact our office. Our fax number is [phone].  If you have an urgent issue when the clinic is closed that cannot wait until the next business day, you can page your doctor at the number   below.    Please note that while we do our best to be available for urgent issues outside of office hours, we are not available 24/7.   If you have an urgent issue and are unable to reach us, you may choose to seek medical care at your doctor's office, retail clinic, urgent care center, or emergency room.  If you have a medical emergency, please immediately call 911 or go to the emergency department.  Pager Numbers  - Dr. Kowalski: [phone]  - Dr. Moye: [phone]  - Dr. Stewart: [phone]  In the event of inclement weather, please call our main line at  [phone] for an update on the status of any delays or closures.  Dermatology Medication Tips: Please keep the boxes that topical medications come in in order to help keep track of the instructions about where and how to use these. Pharmacies typically print the medication instructions only on the boxes and not directly on the medication tubes.   If your medication is too expensive, please contact our office at [phone] option 4 or send us a message through MyChart.   We are unable to tell what your co-pay for medications will be in advance as this is different depending on your insurance coverage. However, we may be able to find a substitute medication at lower cost or fill out paperwork to get insurance to cover a needed medication.   If a prior authorization is required to get your medication covered by your insurance company, please allow us 1-2 business days to complete this process.  Drug prices often vary depending on where the prescription is filled and some pharmacies may offer cheaper prices.  The website www.goodrx.com contains coupons for medications through different pharmacies. The prices here do not account for what the cost may be with help from insurance (it may be cheaper with your insurance), but the website can give you the price if you did not use any insurance.  - You can print the associated coupon and take it with your prescription to the pharmacy.  - You may also stop by our office during regular business hours and pick up a GoodRx coupon card.  - If you need your prescription sent electronically to a different pharmacy, notify our office through [facility] MyChart or by phone at [phone] option 4.     Si Usted Necesita Algo Despus de Su Visita  Tambin puede enviarnos un mensaje a travs de MyChart. Por lo general respondemos a los mensajes de MyChart en el transcurso de 1 a 2 das hbiles.  Para renovar recetas, por favor pida a su farmacia que se  ponga en contacto con nuestra oficina. Nuestro nmero de fax es el [phone].  Si tiene un asunto urgente cuando la clnica est cerrada y que no puede esperar hasta el siguiente da hbil, puede llamar/localizar a su doctor(a) al nmero que aparece a continuacin.   Por favor, tenga en cuenta que aunque hacemos todo lo posible para estar disponibles para asuntos urgentes fuera del horario de oficina, no estamos disponibles las 24 horas del da, los 7 das de la semana.   Si tiene un problema urgente y no puede comunicarse con nosotros, puede optar por buscar atencin mdica  en el consultorio de su doctor(a), en una clnica privada, en un centro de atencin urgente o en una sala de emergencias.  Si tiene una emergencia mdica, por favor llame inmediatamente al 911 o vaya a la sala de emergencias.  Nmeros de bper  -   Dr. Kowalski: [phone]  - Dra. Moye: [phone]  - Dra. Stewart: [phone]  En caso de inclemencias del tiempo, por favor llame a nuestra lnea principal al [phone] para una actualizacin sobre el estado de cualquier retraso o cierre.  Consejos para la medicacin en dermatologa: Por favor, guarde las cajas en las que vienen los medicamentos de uso tpico para ayudarle a seguir las instrucciones sobre dnde y cmo usarlos. Las farmacias generalmente imprimen las instrucciones del medicamento slo en las cajas y no directamente en los tubos del medicamento.   Si su medicamento es muy caro, por favor, pngase en contacto con nuestra oficina llamando al [phone] y presione la opcin 4 o envenos un mensaje a travs de MyChart.   No podemos decirle cul ser su copago por los medicamentos por adelantado ya que esto es diferente dependiendo de la cobertura de su seguro. Sin embargo, es posible que podamos encontrar un medicamento sustituto a menor costo o llenar un formulario para que el seguro cubra el medicamento que se considera necesario.   Si se requiere  una autorizacin previa para que su compaa de seguros cubra su medicamento, por favor permtanos de 1 a 2 das hbiles para completar este proceso.  Los precios de los medicamentos varan con frecuencia dependiendo del lugar de dnde se surte la receta y alguna farmacias pueden ofrecer precios ms baratos.  El sitio web www.goodrx.com tiene cupones para medicamentos de diferentes farmacias. Los precios aqu no tienen en cuenta lo que podra costar con la ayuda del seguro (puede ser ms barato con su seguro), pero el sitio web puede darle el precio si no utiliz ningn seguro.  - Puede imprimir el cupn correspondiente y llevarlo con su receta a la farmacia.  - Tambin puede pasar por nuestra oficina durante el horario de atencin regular y recoger una tarjeta de cupones de GoodRx.  - Si necesita que su receta se enve electrnicamente a una farmacia diferente, informe a nuestra oficina a travs de MyChart de [facility] o por telfono llamando al [phone] y presione la opcin 4.  

## 2022-05-13 ENCOUNTER — Telehealth: Admit: 2022-05-13 | Payer: PRIVATE HEALTH INSURANCE | Attending: Hematology & Oncology | Primary: Internal Medicine

## 2022-05-13 NOTE — Telephone Encounter
Jasmine December calling from [facility] and [facility] - Dr.Rizk's office. Dr.Rizk is requesting a call back from Dr.Mendez today regarding mutual patient.Dr.Rizk [phone]

## 2022-05-25 ENCOUNTER — Encounter: Admit: 2022-05-25 | Payer: PRIVATE HEALTH INSURANCE | Attending: Hematology & Oncology | Primary: Internal Medicine

## 2022-05-28 ENCOUNTER — Encounter: Admit: 2022-05-28 | Payer: PRIVATE HEALTH INSURANCE | Attending: Hematology & Oncology | Primary: Internal Medicine

## 2022-05-28 DIAGNOSIS — C931 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: Secondary | ICD-10-CM

## 2022-05-28 DIAGNOSIS — C9200 Acute myeloblastic leukemia, not having achieved remission: Secondary | ICD-10-CM

## 2022-05-28 DIAGNOSIS — R233 Spontaneous ecchymoses: Secondary | ICD-10-CM

## 2022-05-28 DIAGNOSIS — D649 Anemia, unspecified: Secondary | ICD-10-CM

## 2022-05-29 ENCOUNTER — Inpatient Hospital Stay: Admit: 2022-05-29 | Discharge: 2022-05-29 | Payer: MEDICARE

## 2022-05-29 ENCOUNTER — Encounter: Admit: 2022-05-29 | Payer: PRIVATE HEALTH INSURANCE | Attending: Hematology & Oncology | Primary: Internal Medicine

## 2022-05-29 ENCOUNTER — Encounter
Admit: 2022-05-29 | Payer: PRIVATE HEALTH INSURANCE | Attending: Vascular and Interventional Radiology | Primary: Internal Medicine

## 2022-05-29 ENCOUNTER — Telehealth: Admit: 2022-05-29 | Payer: PRIVATE HEALTH INSURANCE | Attending: Hematology & Oncology | Primary: Internal Medicine

## 2022-05-29 ENCOUNTER — Encounter: Admit: 2022-05-29 | Payer: PRIVATE HEALTH INSURANCE | Attending: Hematology | Primary: Internal Medicine

## 2022-05-29 ENCOUNTER — Ambulatory Visit: Admit: 2022-05-29 | Payer: MEDICARE | Attending: Hematology & Oncology | Primary: Internal Medicine

## 2022-05-29 DIAGNOSIS — R233 Spontaneous ecchymoses: Secondary | ICD-10-CM

## 2022-05-29 DIAGNOSIS — C9200 Acute myeloblastic leukemia, not having achieved remission: Secondary | ICD-10-CM

## 2022-05-29 DIAGNOSIS — N400 Enlarged prostate without lower urinary tract symptoms: Secondary | ICD-10-CM

## 2022-05-29 DIAGNOSIS — D708 Other neutropenia: Secondary | ICD-10-CM

## 2022-05-29 DIAGNOSIS — C9110 Chronic lymphocytic leukemia of B-cell type not having achieved remission: Secondary | ICD-10-CM

## 2022-05-29 DIAGNOSIS — H409 Unspecified glaucoma: Secondary | ICD-10-CM

## 2022-05-29 DIAGNOSIS — C931 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: Secondary | ICD-10-CM

## 2022-05-29 DIAGNOSIS — Z8249 Family history of ischemic heart disease and other diseases of the circulatory system: Secondary | ICD-10-CM

## 2022-05-29 DIAGNOSIS — D649 Anemia, unspecified: Secondary | ICD-10-CM

## 2022-05-29 DIAGNOSIS — E782 Mixed hyperlipidemia: Secondary | ICD-10-CM

## 2022-05-29 DIAGNOSIS — F419 Anxiety disorder, unspecified: Secondary | ICD-10-CM

## 2022-05-29 DIAGNOSIS — I10 Essential (primary) hypertension: Secondary | ICD-10-CM

## 2022-05-29 DIAGNOSIS — F32A Depression: Secondary | ICD-10-CM

## 2022-05-29 DIAGNOSIS — C4491 Basal cell carcinoma of skin, unspecified: Secondary | ICD-10-CM

## 2022-05-29 DIAGNOSIS — I119 Hypertensive heart disease without heart failure: Secondary | ICD-10-CM

## 2022-05-29 DIAGNOSIS — D500 Iron deficiency anemia secondary to blood loss (chronic): Secondary | ICD-10-CM

## 2022-05-29 DIAGNOSIS — R0609 Other forms of dyspnea: Secondary | ICD-10-CM

## 2022-05-29 LAB — COMPREHENSIVE METABOLIC PANEL
BKR A/G RATIO: 1.7 (ref 1.0–2.2)
BKR ALANINE AMINOTRANSFERASE (ALT): 21 U/L (ref 9–59)
BKR ALBUMIN: 4.1 g/dL (ref 3.6–5.1)
BKR ALKALINE PHOSPHATASE: 58 U/L (ref 9–122)
BKR ANION GAP: 13 (ref 7–17)
BKR ASPARTATE AMINOTRANSFERASE (AST): 26 U/L (ref 10–35)
BKR BLOOD UREA NITROGEN: 15 mg/dL (ref 8–23)
BKR CALCIUM: 9.1 mg/dL (ref 8.8–10.2)
BKR CHLORIDE: 107 mmol/L — ABNORMAL HIGH (ref 98–107)
BKR CO2: 19 mmol/L — ABNORMAL LOW (ref 20–30)
BKR CREATININE: 1.06 mg/dL (ref 0.40–1.30)
BKR EGFR, CREATININE (CKD-EPI 2021): >60 mL/min/{1.73_m2} (ref >=60)
BKR GLOBULIN: 2.4 g/dL (ref 2.0–3.9)
BKR GLUCOSE: 105 mg/dL — ABNORMAL HIGH (ref 70–100)
BKR POTASSIUM: 4.3 mmol/L (ref 3.3–5.3)
BKR PROTEIN TOTAL: 6.5 g/dL (ref 5.9–8.3)
BKR SODIUM: 139 mmol/L — ABNORMAL LOW (ref 136–144)
BKR WAM BLASTS (DIFF) (1 DEC): 4.1 g/dL — CR (ref 3.6–5.1)
BKR WAM HYPOCHROMIA: >60 mL/min/1.73m2 — AB (ref >=60)

## 2022-05-29 LAB — MANUAL DIFFERENTIAL
BKR WAM BASOPHIL - ABS (DIFF) 2 DEC: 0 x 1000/??L (ref 0.00–1.00)
BKR WAM BASOPHILS (DIFF): 0 % (ref 0.0–1.4)
BKR WAM EOSINOPHILS (DIFF) 2 DEC: 0 x 1000/ÂµL (ref 0.00–1.00)
BKR WAM EOSINOPHILS (DIFF): 0 % (ref 0.0–5.0)
BKR WAM LYMPHOCYTE - ABS (DIFF) 2 DEC: 0.57 x 1000/??L — ABNORMAL LOW (ref 0.60–3.70)
BKR WAM LYMPHOCYTES (DIFF): 44 % (ref 17.0–50.0)
BKR WAM MONOCYTE - ABS (DIFF) 2 DEC: 0.1 x 1000/??L (ref 0.00–1.00)
BKR WAM MONOCYTES (DIFF): 8 % (ref 4.0–12.0)
BKR WAM NEUTROPHILS (DIFF): 40 % (ref 39.0–72.0)
BKR WAM NEUTROPHILS - ABS (DIFF) 2 DEC: 0.52 x 1000/ÂµL — ABNORMAL LOW (ref 2.00–7.60)

## 2022-05-29 LAB — IRON AND TIBC
BKR FERRITIN: 365 ug/dL — ABNORMAL LOW (ref 250–450)
BKR IRON SATURATION: 15 % (ref 15–50)
BKR IRON: 53 ug/dL — ABNORMAL LOW (ref 59–158)
BKR TOTAL IRON BINDING CAPACITY: 365 ug/dL (ref 250–450)

## 2022-05-29 LAB — LACTATE DEHYDROGENASE: BKR LACTATE DEHYDROGENASE: 264 U/L — ABNORMAL HIGH (ref 122–241)

## 2022-05-29 LAB — RETICULOCYTES
BKR WAM IRF: 41.3 % — ABNORMAL HIGH (ref 3.0–15.9)
BKR WAM RETICULOCYTE - ABS (3 DEC): 0.093 10??6 cells/uL (ref 0.023–0.140)
BKR WAM RETICULOCYTE COUNT PCT (1 DEC): 3.9 % — ABNORMAL HIGH (ref 0.6–2.7)
BKR WAM RETICULOCYTE HGB EQUIVALENT: 17.1 pg — ABNORMAL LOW (ref 28.2–35.7)

## 2022-05-29 LAB — PT/INR AND PTT (BH GH L LMW YH)
BKR INR: 1.08 (ref 0.86–1.12)
BKR PARTIAL THROMBOPLASTIN TIME: <20 s — ABNORMAL LOW (ref 23.0–31.4)

## 2022-05-29 LAB — URIC ACID: BKR URIC ACID: 4.5 mg/dL (ref 2.7–7.3)

## 2022-05-29 LAB — PHOSPHORUS     (BH GH L LMW YH): BKR PHOSPHORUS: 2.5 mg/dL (ref 2.2–4.5)

## 2022-05-29 LAB — CBC WITH AUTO DIFFERENTIAL
BKR BUN / CREAT RATIO: 0.02 x 1000/??L (ref 0.00–1.00)
BKR WAM ABSOLUTE NRBC (2 DEC): 0.02 x 1000/ÂµL (ref 0.00–1.00)
BKR WAM HEMATOCRIT (2 DEC): 23.9 % — ABNORMAL LOW (ref 38.50–50.00)
BKR WAM HEMOGLOBIN: 6.8 g/dL — ABNORMAL LOW (ref 13.2–17.1)
BKR WAM MCH (PG): 28.6 pg (ref 27.0–33.0)
BKR WAM MCHC: 28.5 g/dL — ABNORMAL LOW (ref 31.0–36.0)
BKR WAM MCV: 100.4 fL — ABNORMAL HIGH (ref 80.0–100.0)
BKR WAM MPV: 11.5 fL (ref 8.0–12.0)
BKR WAM NUCLEATED RED BLOOD CELLS: 1.6 % — ABNORMAL HIGH (ref 0.0–1.0)
BKR WAM PLATELETS: 240 x1000/??L (ref 150–420)
BKR WAM RDW-CV: 19.6 % — ABNORMAL HIGH (ref 11.0–15.0)
BKR WAM RED BLOOD CELL COUNT.: 2.38 M/??L — ABNORMAL LOW (ref 4.00–6.00)
BKR WAM WHITE BLOOD CELL COUNT: 1.3 x1000/??L — CL (ref 4.0–11.0)

## 2022-05-29 LAB — BLOOD SMEAR MD INTERPRETATION     (YH)

## 2022-05-29 LAB — TSH W/REFLEX TO FT4     (BH GH LMW Q YH): BKR THYROID STIMULATING HORMONE: 3.17 ??IU/mL

## 2022-05-29 LAB — FIBRINOGEN     (BH GH L LMW YH): BKR FIBRINOGEN LEVEL: 266 mg/dL — ABNORMAL LOW (ref 194–448)

## 2022-05-29 MED ORDER — LEVOFLOXACIN 500 MG TABLET
500 mg | ORAL_TABLET | Freq: Every day | ORAL | 6 refills | Status: AC
Start: 2022-05-29 — End: ?

## 2022-05-29 MED ORDER — VORICONAZOLE 200 MG TABLET
200 mg | ORAL_TABLET | Freq: Two times a day (BID) | ORAL | 6 refills | Status: AC
Start: 2022-05-29 — End: ?
  Filled 2022-06-15: qty 30, 15d supply, fill #0

## 2022-05-29 MED ORDER — ALLOPURINOL 300 MG TABLET
300 mg | ORAL_TABLET | Freq: Every day | ORAL | 1 refills | Status: AC
Start: 2022-05-29 — End: ?

## 2022-05-29 MED ORDER — ACYCLOVIR 400 MG TABLET
400 mg | ORAL_TABLET | Freq: Two times a day (BID) | ORAL | 6 refills | Status: AC
Start: 2022-05-29 — End: ?

## 2022-05-29 NOTE — Telephone Encounter
[facility] faxed over notice that prior authorization has been started on Voriconzaole 200 mg -placed into NP7 folder for review/printing

## 2022-05-29 NOTE — Progress Notes
HEMATOLOGIC MALIGNANCIES CLINICPROGRESS NOTE4/01/2023 Diagnosis: CMML/MDS subtypeHistory of Present Illness:Mr. 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 He presented in 07/2021 to establish hematologic malignancies care given that his daughter lives in [place]. He resides in [place] and is under the care of Dr. Sofie Rower of [facility] in [place]. He established care with Dr. Mel Almond 05/14/21. He has been under observation for the CMML. Around February, his Hb was found to be 5 for which he received 2U of PRBC. He had had no viral syndrome prior. 3-4 weeks prior he had sustained a bad fall in which his LLE became significantly swollen. He was to have a [place] LLE which did not occur. There is a question of whether he could have bled into his leg. At the time he became progressively fatigued and SOB. He had a cardiac evaluation including a pharmacologic stress test which was reported to him as normal. He did not have endoscopies; last colonoscopy was in 2021. He was found to be iron deficient and his Hb responded to IV iron rising to 12.2 in 12/2020. A bone marrow biopsy and aspiration was repeat 05/16/21 and reported to be stable. 07/08/21 he saw Dr. Mendel Corning at [facility] who recommended deferring allogeneic SCT until the time of progression and HMA until the time of critical cytopenias.At baseline he is chronically tired, sleeps very poorly; has been recommended to have sleep test.He is not limited in his activities; walks once a day for 20 minutes.In [place] he was bicycling and walking up to 3-4X/day.Interval History:Mr. Datema presents for f/u in the clinic with his wife and daughter.He was last seen 07/2021.In the interim, Dr. Mel Almond reached out to inform me that Mr. 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.He was offered admission at [facility] and decided to come to [place] to be treated near his family.Review of CBC in care everywhere shows that WBC count trended down to 1.29, ANC to 0.31 with Hb 12.4 on the same date and platelets 121, 01/16/22.The following CBC on 04/07/22 showed WBC 1.4, ANC 0.75, Hb 5.9, MCV 101, Platelets 383 for which he was transfused 2U of PRBC on 04/09/22 and 1U of PRBC on 04/24/22.The anemia w/u on 04/07/22 showed ferritin 15, epo level 632, TSH 4.2, B12 1342, folate >20, retic 6.29%, LDH 257(ULN 246).The development of severe bicytopenia triggered the bone marrow biopsy and aspiration 04/28/22.He was subsequently started on venofer and had 5-6 infusions of 200 mg.He reports that he feels tired and gets SOB.He also gets dizzy which he associates with levofloxacin.No chest pain.No nausea.No tingling/numbness in the neck of left arm.We discussed the rapid drop in the Hb and finding of iron deficiency.Tells me he did not have endoscopies which were set up for him out of concern that his post-nasal gtt would make them difficult to tolerate.Pathology:	04/28/22 BONE MARROW, ASPIRATE SMEARS, CLOT SECTION AND CORE BIOPSY:               Hypercellular bone marrow (80%) with involvement by a chronic myelodysplastic/myeloproliferative neoplasm, compatible with CMML-2.              Significantly increased myeloid blasts (SEE COMMENTS)              No lymphoproliferative  disorder or plasma cell neoplasm identified.              Decreased storage iron; no ringed sideroblasts identified.               Mild bone marrow reticulin fibrosis.Flow cytometric analysis identified significantly increased CD34+ myeloid blasts (9.7%). No monoclonal B-cell population, monoclonal plasma cell population or aberrant T-cell expression was seen (see separate report for details). Comment: ...there are significantly increased CD34+ myeloid blasts within the bone marrow averaging 10-15% in most areas, with one focal area with significantly more blasts which are starting to aggregate in the 20%+ range. These latter findings are very concerning for evolving Acute Myeloid Leukemia. 46,XY[20]ASXL1 p.Gly646TrpfsX12 VAF 32%[name] p.Pro95His VAF 50%STAG2 p.ZOX096EAVWUJ8 VAF 33%STAG2 [id number] VAF 54%RUNX1 p.Arg204GIn VAF 25% Tier IITET2 p.Asn1387Ser VAF 49% Tier II3/31/2023 Bone marrow biopsyMyeloid neoplsm without excess blasts50%cellular marrow with dysmegakaryopoiesis and monocytic hyperplasiaAcellular aspirateNo excess fibrosis on reticulin stainingFlow without increased blasts; increased monocytes with normal phenotype46XX [?]ASXL1, TET2 and [name] mutated2021 marrow results unavailablePMH:?CLLHypertensionHyperlipidemiaGlaucomaBasal cell carcinoma, foreheadProstate hypertrophyDepressionAnxietyMedications: Prophylaxis reviewed. Full medication reconciliation pending.No Known Allergies FH: No history of hematologic malignanciesFather - deceased at 61 of complications of metastatic prostate cancerMother - decease in her 2s of metastatic breast cancerSister/Nancy - breast cancer in her 30s, 66Sister - in good health, 63Daughter - type I DMDaughter - good healthSH: He lives with his wife. He retired from [employer] in 2009. Moved from [facility] to [place] area in 2023. Never smoker. 1-2 beers a week. Bicycling and gardening as hobbies.His daughter lives in [place]. Physical Exam:Vitals:  05/29/22 0951 BP: 101/65 Pulse: (!) 92 Resp: 19 Temp: 98.4 ?F (36.9 ?C)  General: Tired-appearing man in NAD.HEENT: NC/AT, anicteric sclera, MMM, OP clearCV: RRR with occasional ectopy, nl s1/s2, no m/r/gLungs: CTA BLAbdomen: nondistended, obese, soft, no ttp, no masses or organomegaly appreciatedExtremities: wwp, no edemaNeurologic: A&OX3, grossly non-focalSkin: no rash on the inspected torsoLabs:Lab Results Component Value Date  WBC 1.3 (LL) 05/29/2022  HGB 6.8 (L) 05/29/2022  HCT 23.90 (L) 05/29/2022  MCV 100.4 (H) 05/29/2022  PLT 240 05/29/2022    Chemistry    Component Value Date/Time  NA 139 05/29/2022 0937  K 4.3 05/29/2022 0937  CL 107 05/29/2022 0937  CO2 19 (L) 05/29/2022 0937  BUN 15 05/29/2022 0937  CREATININE 1.06 05/29/2022 0937  GLU 105 (H) 05/29/2022 0937  PROT 6.5 05/29/2022 0937    Component Value Date/Time  CALCIUM 9.1 05/29/2022 0937  ALKPHOS 58 05/29/2022 0937  AST 26 05/29/2022 0937  ALT 21 05/29/2022 0937  BILITOT 0.7 05/29/2022 0937  ALBUMIN 4.1 05/29/2022 0937   Assessment and Plan:Mr. Millay is a 72 year old man with a history of depression, anxiety, iron deficiency anemia, CLL, CMML (diagnosed 12/2019) who developed mild pancytopenia in 01/2022 followed by severe anemia with Hb 5 iso of iron deficiency and persistent leukopenia/neutropenia 04/07/22 now with marrow done in [facility] under the care of Dr. Marnette Burgess raising concern for disease evolving to AML.# Secondary AML from CMMLDiagnostics- Will repeat marrow given that slides are not available and one month has elapsed; [place] guided w/ sedation, ordered and scheduling in progress.	Send NPM1 PCR, AML/MDS FISH, [facility] heme panel in addition to [facility].- FLT3-TKD/ITD PCR from [facility] pending. - Given he now has peripheral blasts, NPM1 PCR and MDS/AML rapid panel ordered from [facility] to be drawn tomorrowTreatment- Plan is pending results of w/u with options ranging from intensive induction chemotherapy such as with Vyxeos vs. Hma+/- venetoclax vs treatment on a clinical trial  such as in the event of NPM1 mutation, MLL-rearrangement.- Consented for Decitabine/venetoclax which is currently the plan for induction in the absence of an NPM1 mutation, MLL-rearrangement or FLT3 mutationMonitoring- Biweekly labs with CBC w/ diff, CMP, TLS and DIC labs for now.- TLS ppx: start allopurinol 300 mg PO dailySupportive care- TLS ppx: allopurinol 300 mg PO daily (eprescribed)- Hb goal >=8, Plts >=20 (consented for transfusions today)- Hickman order placed; scheduling progress# Anti-microbial ProphylaxisHSV/VZV: Acylovir 400 mg PO bid (prior dose adjusted; eprescribed)Bacterial: Fluoroquinolone for ANC<500 --> Will decrease ppx to 500 mg daily (eprescribed)Fungal: broad azole for ANC<500 --> Will change fluconazole for voriconazole (eprescribed)# CLL- 04/28/22 BM aspirate flow cytometry did not detect a monoclonal B cell population# Iron deficiency anemia- s/p IV iron ~1g March 2024- Send [place], repeat reticulocyte count, iron studies- urgent EGD/colonoscopy referral placed as anti-neoplastic therapy and disease are expected to result in increased bleeding risk due to anticipated thrombocytopenia# AnxietyManaged with non-pharmacologic methods.Summary of Plan:[place] guided BM biopsy next weekHickman placementBiweekly labs to monitor for transfusion needs, TLS, DICFLT3 PCR pending, NPM1 PCR and karyotype/FISH to be collected tomorrowHLA typing, HLA ab, CMV IgG to be collected tomorrow. Referral to transplant placed.Plan is for decitabine/venetoclax in the absence of NPM1, MLL or FLT3 mutation/alteration, and if he remains stable will do this as an outpatient. Targeting 06/15/22 start date.L. Sherrell Puller, MD PhD On the day of this patient's encounter, a total of 60 minutes was personally spent by me.  This does not include any resident/fellow teaching time, or any time spent performing a procedural service.

## 2022-05-30 ENCOUNTER — Inpatient Hospital Stay: Admit: 2022-05-30 | Discharge: 2022-05-30 | Payer: MEDICARE

## 2022-05-30 ENCOUNTER — Encounter: Admit: 2022-05-30 | Payer: PRIVATE HEALTH INSURANCE | Primary: Internal Medicine

## 2022-05-30 DIAGNOSIS — H409 Unspecified glaucoma: Secondary | ICD-10-CM

## 2022-05-30 DIAGNOSIS — N400 Enlarged prostate without lower urinary tract symptoms: Secondary | ICD-10-CM

## 2022-05-30 DIAGNOSIS — F419 Anxiety disorder, unspecified: Secondary | ICD-10-CM

## 2022-05-30 DIAGNOSIS — I10 Essential (primary) hypertension: Secondary | ICD-10-CM

## 2022-05-30 DIAGNOSIS — E782 Mixed hyperlipidemia: Secondary | ICD-10-CM

## 2022-05-30 DIAGNOSIS — C9200 Acute myeloblastic leukemia, not having achieved remission: Secondary | ICD-10-CM

## 2022-05-30 DIAGNOSIS — I119 Hypertensive heart disease without heart failure: Secondary | ICD-10-CM

## 2022-05-30 DIAGNOSIS — Z8249 Family history of ischemic heart disease and other diseases of the circulatory system: Secondary | ICD-10-CM

## 2022-05-30 DIAGNOSIS — F32A Depression: Secondary | ICD-10-CM

## 2022-05-30 DIAGNOSIS — R0609 Other forms of dyspnea: Secondary | ICD-10-CM

## 2022-05-30 DIAGNOSIS — C4491 Basal cell carcinoma of skin, unspecified: Secondary | ICD-10-CM

## 2022-05-30 DIAGNOSIS — C9110 Chronic lymphocytic leukemia of B-cell type not having achieved remission: Secondary | ICD-10-CM

## 2022-05-30 DIAGNOSIS — C931 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: Secondary | ICD-10-CM

## 2022-05-30 DIAGNOSIS — D509 Iron deficiency anemia, unspecified: Secondary | ICD-10-CM

## 2022-05-30 LAB — METHYLMALONIC ACID: BKR METHYLMALONIC ACID: 0.15 umol/L (ref 0.00–0.40)

## 2022-05-30 MED ORDER — ACETAMINOPHEN 325 MG TABLET
325 mg | Freq: Once | ORAL | Status: CP
Start: 2022-05-30 — End: ?
  Administered 2022-05-30: 17:00:00 325 mg via ORAL

## 2022-05-30 MED ORDER — DIPHENHYDRAMINE 25 MG CAPSULE
25 mg | Freq: Once | ORAL | Status: CP
Start: 2022-05-30 — End: ?
  Administered 2022-05-30: 17:00:00 25 mg via ORAL

## 2022-05-30 NOTE — Patient Instructions
Pick up your medications at pharmacy and take as directed. You are on the WAIT LIST for an appointment at [facility] for 4/17

## 2022-05-30 NOTE — Progress Notes
[name] is at first NP 7 infusion appointment. HE saw Dr Douglas [name] on 4/12 and based on labs then, transfusion to be given today. HE has not picked up his medications at his [facility] and was instructed to do so today.VSS. See focused reassessment.Consent signed with NP at [facility], Douglas Leriche T. He received premed of tylenol and benadryl based on his report of being premedicated at other hospital (their standard). RBC transfused w/o adverse event. Extra labs drawn per Dr Douglas [name] request. He returns to NP 7 on Wednesday 4/17, he is wait listed for that appointemnt.

## 2022-05-31 LAB — CYTOMEGALOVIRUS ANTIBODY, IGG: BKR CMV IGG INITIAL RESULT: <0.2 U/mL

## 2022-06-01 DIAGNOSIS — D649 Anemia, unspecified: Secondary | ICD-10-CM | POA: Diagnosis not present

## 2022-06-02 ENCOUNTER — Telehealth: Admit: 2022-06-02 | Payer: PRIVATE HEALTH INSURANCE | Attending: Hematology & Oncology | Primary: Internal Medicine

## 2022-06-02 ENCOUNTER — Encounter: Admit: 2022-06-02 | Payer: PRIVATE HEALTH INSURANCE | Attending: Adult Health | Primary: Internal Medicine

## 2022-06-02 NOTE — Telephone Encounter
Patient calling in for RN who is awarePt of Dr. Verdene Lennert stated he received a call to come in tomorrow, 06/03/2022 for treatment and is also scheduled for Saturday.  Patient is requesting to keep Saturday appointment and not come in tomorrow.Please call patient at [phone].  Contact made to RN who is aware and stated she will call patient back.

## 2022-06-02 NOTE — Telephone Encounter
RC to [place] to advise him to keep tomorrows appointment as he only received one unit of blood. On Saturday. He stated that he felt good and he felt that he can wait until Saturday. I said I strongly disagree with this plan. He agreed to call in the morning with an update of how he feels. I agreed that he should call and update [place] he appreciated the call.

## 2022-06-03 ENCOUNTER — Inpatient Hospital Stay: Admit: 2022-06-03 | Payer: PRIVATE HEALTH INSURANCE

## 2022-06-03 ENCOUNTER — Telehealth: Admit: 2022-06-03 | Payer: PRIVATE HEALTH INSURANCE | Primary: Internal Medicine

## 2022-06-03 LAB — FLT3-ITD/FLT3-D835, BLOOD (YH)
BKR FLT3-D835, BLOOD: NEGATIVE
BKR FLT3-ITD, BLOOD: NEGATIVE
BKR PROTHROMBIN TIME: 11.9 seconds (ref 9.6–12.3)

## 2022-06-03 LAB — NPM1 MUTATION BY PCR AND FRAG. ANALYSIS     (YH)
BKR NPM1 MUTATION: NOT DETECTED
BKR PERCENTAGE NPM1 MUTATION: <0.01 % (ref <0.01)

## 2022-06-03 LAB — FISH (CYTOGENETICS) (YMG)

## 2022-06-03 LAB — AML/MDS RAPID GENE PANEL (BH GH YH)

## 2022-06-03 NOTE — Telephone Encounter
Pt contacted clinic stating he would like to cancel his labs/poss appt for today as he is feeling well. He prefers to wait until his Saturday appt due to difficult IV access at his appt this past weekend. Will update primary team.

## 2022-06-05 ENCOUNTER — Encounter: Admit: 2022-06-05 | Payer: PRIVATE HEALTH INSURANCE | Attending: Adult Health | Primary: Internal Medicine

## 2022-06-06 ENCOUNTER — Inpatient Hospital Stay: Admit: 2022-06-06 | Discharge: 2022-06-06 | Payer: MEDICARE

## 2022-06-06 ENCOUNTER — Encounter: Admit: 2022-06-06 | Payer: PRIVATE HEALTH INSURANCE | Attending: Hematology & Oncology | Primary: Internal Medicine

## 2022-06-06 DIAGNOSIS — C931 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: Secondary | ICD-10-CM

## 2022-06-06 DIAGNOSIS — C9200 Acute myeloblastic leukemia, not having achieved remission: Secondary | ICD-10-CM

## 2022-06-06 LAB — COMPREHENSIVE METABOLIC PANEL
BKR A/G RATIO: 1.6 (ref 1.0–2.2)
BKR ALANINE AMINOTRANSFERASE (ALT): 20 U/L (ref 9–59)
BKR ALBUMIN: 4.2 g/dL (ref 3.6–5.1)
BKR ALKALINE PHOSPHATASE: 72 U/L (ref 9–122)
BKR ANION GAP: 11 (ref 7–17)
BKR ASPARTATE AMINOTRANSFERASE (AST): 34 U/L (ref 10–35)
BKR AST/ALT RATIO: 1.7
BKR BILIRUBIN TOTAL: 0.5 mg/dL (ref <=1.2)
BKR BLOOD UREA NITROGEN: 17 mg/dL (ref 8–23)
BKR BUN / CREAT RATIO: 17.5 (ref 8.0–23.0)
BKR CALCIUM: 9 mg/dL (ref 8.8–10.2)
BKR CREATININE: 0.97 mg/dL (ref 0.40–1.30)
BKR EGFR, CREATININE (CKD-EPI 2021): >60 mL/min/{1.73_m2} (ref >=60)
BKR GLOBULIN: 2.7 g/dL (ref 2.0–3.9)
BKR GLUCOSE: 91 mg/dL (ref 70–100)
BKR POTASSIUM: 4.4 mmol/L (ref 3.3–5.3)
BKR PROTEIN TOTAL: 6.9 g/dL (ref 5.9–8.3)
BKR SODIUM: 138 mmol/L (ref 136–144)
BKR WAM MCH (PG): 0.97 mg/dL (ref 0.40–1.30)
BKR WAM RDW-CV: 17.5 % — ABNORMAL HIGH (ref 8.0–23.0)

## 2022-06-06 LAB — MANUAL DIFFERENTIAL
BKR WAM BASOPHIL - ABS (DIFF) 2 DEC: 0 x 1000/??L (ref 0.00–1.00)
BKR WAM BASOPHILS (DIFF): 0 % (ref 0.0–1.4)
BKR WAM BLASTS (DIFF) (1 DEC): 4 % — CR (ref 0.0–0.0)
BKR WAM EOSINOPHILS (DIFF) 2 DEC: 0 x 1000/??L (ref 0.00–1.00)
BKR WAM EOSINOPHILS (DIFF): 0 % (ref 0.0–5.0)
BKR WAM LYMPHOCYTE - ABS (DIFF) 2 DEC: 0.53 x 1000/??L — ABNORMAL LOW (ref 0.60–3.70)
BKR WAM LYMPHOCYTES (DIFF): 44 % (ref 17.0–50.0)
BKR WAM MONOCYTE - ABS (DIFF) 2 DEC: 0 x 1000/??L (ref 0.00–1.00)
BKR WAM MONOCYTES (DIFF): 0 % — ABNORMAL LOW (ref 4.0–12.0)
BKR WAM NEUTROPHILS (DIFF): 52 % (ref 39.0–72.0)
BKR WAM NEUTROPHILS - ABS (DIFF) 2 DEC: 0.62 x 1000/??L — ABNORMAL LOW (ref 2.00–7.60)

## 2022-06-06 LAB — CBC WITH AUTO DIFFERENTIAL
BKR CHLORIDE: 1.2 x1000/??L — ABNORMAL LOW (ref 4.0–11.0)
BKR CO2: 2.73 M/??L — ABNORMAL LOW (ref 4.00–6.00)
BKR WAM ABSOLUTE NRBC (2 DEC): 0.02 x 1000/??L (ref 0.00–1.00)
BKR WAM HEMATOCRIT (2 DEC): 25.7 % — ABNORMAL LOW (ref 38.50–50.00)
BKR WAM HEMOGLOBIN: 7.6 g/dL — ABNORMAL LOW (ref 13.2–17.1)
BKR WAM MCHC: 29.6 g/dL — ABNORMAL LOW (ref 31.0–36.0)
BKR WAM MCV: 94.1 fL (ref 80.0–100.0)
BKR WAM MPV: 10.8 fL (ref 8.0–12.0)
BKR WAM NUCLEATED RED BLOOD CELLS: 1.7 % — ABNORMAL HIGH (ref 0.0–1.0)
BKR WAM PLATELETS: 690 x1000/??L — ABNORMAL HIGH (ref 150–420)
BKR WAM RED BLOOD CELL COUNT.: 2.73 M/??L — ABNORMAL LOW (ref 4.00–6.00)
BKR WAM WHITE BLOOD CELL COUNT: 1.2 x1000/ÂµL — ABNORMAL LOW (ref 4.0–11.0)

## 2022-06-06 LAB — LACTATE DEHYDROGENASE: BKR LACTATE DEHYDROGENASE: 361 U/L — ABNORMAL HIGH (ref 122–241)

## 2022-06-06 LAB — URIC ACID: BKR URIC ACID: 3.1 mg/dL (ref 2.7–7.3)

## 2022-06-06 LAB — PHOSPHORUS     (BH GH L LMW YH): BKR PHOSPHORUS: 2.8 mg/dL (ref 2.2–4.5)

## 2022-06-06 MED ORDER — ACETAMINOPHEN 325 MG TABLET
325 mg | Freq: Once | ORAL | Status: CP
Start: 2022-06-06 — End: ?
  Administered 2022-06-06: 16:00:00 325 mg via ORAL

## 2022-06-06 MED ORDER — AMLODIPINE 10 MG TABLET
10 mg | Freq: Every day | ORAL | Status: AC
Start: 2022-06-06 — End: ?

## 2022-06-06 MED ORDER — DIPHENHYDRAMINE 25 MG CAPSULE
25 mg | Freq: Once | ORAL | Status: CP
Start: 2022-06-06 — End: ?
  Administered 2022-06-06: 16:00:00 25 mg via ORAL

## 2022-06-06 MED ORDER — LATANOPROST 0.005 % EYE DROPS
0.005 % | Freq: Every evening | OPHTHALMIC | Status: AC
Start: 2022-06-06 — End: ?

## 2022-06-06 NOTE — Progress Notes
Here for labs/supportive care.Reports good energy and good appetite.No fever/chills; no nausea/vomiting/diarrhea.Skin intact; no edema noted.Has SOB with moderate exertion; no cough nor nasal congestion.PIV gauge 22 placed on right FA and labs drawn.H/H- 7.6/25.7, platelet-690, and ANC-0.62.Confirmed with patient that he takes acyclovir/levofloxacin for prophylaxis.Given benadryl and tylenol po for premedications.Tolerated blood transfusion without issues, with stable vital signs.PIV d/c and pt discharged with his wife.RTC on Wednesday for hickman placement and labs/supportive care.

## 2022-06-08 ENCOUNTER — Telehealth: Admit: 2022-06-08 | Payer: PRIVATE HEALTH INSURANCE | Attending: Hematology & Oncology | Primary: Internal Medicine

## 2022-06-08 ENCOUNTER — Encounter: Admit: 2022-06-08 | Payer: PRIVATE HEALTH INSURANCE | Primary: Internal Medicine

## 2022-06-08 ENCOUNTER — Encounter
Admit: 2022-06-08 | Payer: PRIVATE HEALTH INSURANCE | Attending: Vascular and Interventional Radiology | Primary: Internal Medicine

## 2022-06-08 NOTE — Telephone Encounter
Patient's wife Okey Regal calling. She would like to know if we had any luck arranging transfusions in [place].

## 2022-06-09 ENCOUNTER — Encounter: Admit: 2022-06-09 | Payer: PRIVATE HEALTH INSURANCE | Primary: Internal Medicine

## 2022-06-09 ENCOUNTER — Telehealth: Admit: 2022-06-09 | Payer: PRIVATE HEALTH INSURANCE | Attending: Medical Oncology | Primary: Internal Medicine

## 2022-06-09 NOTE — Progress Notes
I have received HLA typing on this patient from the [facility].A preliminary search of the [employer] Registry was performed to show many potential 10/10 donors. HLA antibodies positive.A formal search has not been requested or performed at this time. Electronically Signed by Elie Confer, RN, June 09, 2022

## 2022-06-09 NOTE — Telephone Encounter
RC to [place] in regards to treatment at [facility]. I advised her that per Dr Sherrell Puller he will be treated in [facility] first then she will reach out to a MD in [place] to assist with supportive care. I also reviewed his treatment schedule with her as well. She appreciated the call.

## 2022-06-09 NOTE — Telephone Encounter
Pt's wife Okey Regal called, requesting a call back, to clarify treatment appointment that was scheduled for 06/10/2022 in the morning. Okey Regal stated that she and Pt cannot get to clinic that early in the morning as they are traveling over an hour from [place]. They would like to know why it was switched form the afternoonAlso Okey Regal stated that she believes Pt does not need another transfusion as Pt recently had one on Saturday, and they are not sure if Pt is only needing to be tested vs treatment.

## 2022-06-10 ENCOUNTER — Inpatient Hospital Stay: Admit: 2022-06-10 | Discharge: 2022-06-10 | Payer: MEDICARE

## 2022-06-10 ENCOUNTER — Telehealth: Admit: 2022-06-10 | Payer: PRIVATE HEALTH INSURANCE | Attending: Hematology & Oncology | Primary: Internal Medicine

## 2022-06-10 ENCOUNTER — Inpatient Hospital Stay: Admit: 2022-06-10 | Discharge: 2022-06-10 | Payer: MEDICARE | Attending: Diagnostic Radiology

## 2022-06-10 DIAGNOSIS — Z85828 Personal history of other malignant neoplasm of skin: Secondary | ICD-10-CM

## 2022-06-10 DIAGNOSIS — Z7982 Long term (current) use of aspirin: Secondary | ICD-10-CM

## 2022-06-10 DIAGNOSIS — N400 Enlarged prostate without lower urinary tract symptoms: Secondary | ICD-10-CM

## 2022-06-10 DIAGNOSIS — E782 Mixed hyperlipidemia: Secondary | ICD-10-CM

## 2022-06-10 DIAGNOSIS — I10 Essential (primary) hypertension: Secondary | ICD-10-CM

## 2022-06-10 DIAGNOSIS — Z79899 Other long term (current) drug therapy: Secondary | ICD-10-CM

## 2022-06-10 DIAGNOSIS — C9200 Acute myeloblastic leukemia, not having achieved remission: Secondary | ICD-10-CM

## 2022-06-10 LAB — CBC WITH AUTO DIFFERENTIAL
BKR BILIRUBIN TOTAL: 55.8 % (ref 39.0–72.0)
BKR CREATININE: 28.3 pg (ref 27.0–33.0)
BKR GLOBULIN: 0 % (ref 0.0–1.4)
BKR WAM ABSOLUTE IMMATURE GRANULOCYTES.: 0.01 x 1000/??L (ref 0.00–0.30)
BKR WAM ABSOLUTE LYMPHOCYTE COUNT.: 0.44 x 1000/??L — ABNORMAL LOW (ref 0.60–3.70)
BKR WAM ABSOLUTE NRBC (2 DEC): 0 x 1000/??L (ref 0.00–1.00)
BKR WAM BASOPHIL ABSOLUTE COUNT.: 0 x 1000/??L (ref 0.00–1.00)
BKR WAM BASOPHILS: 0 % (ref 0.0–1.4)
BKR WAM EOSINOPHIL ABSOLUTE COUNT.: 0 x 1000/??L (ref 0.00–1.00)
BKR WAM EOSINOPHILS: 0 % — ABNORMAL HIGH (ref 0.0–5.0)
BKR WAM HEMATOCRIT (2 DEC): 26.4 % — ABNORMAL LOW (ref 38.50–50.00)
BKR WAM HEMOGLOBIN: 7.8 g/dL — ABNORMAL LOW (ref 13.2–17.1)
BKR WAM IMMATURE GRANULOCYTES: 0.8 % (ref 0.0–1.0)
BKR WAM MCH (PG): 28.3 pg (ref 27.0–33.0)
BKR WAM MCHC: 29.5 g/dL — ABNORMAL LOW (ref 31.0–36.0)
BKR WAM MCV: 95.7 fL (ref 80.0–100.0)
BKR WAM MONOCYTE ABSOLUTE COUNT.: 0.08 x 1000/??L (ref 0.00–1.00)
BKR WAM MONOCYTES: 6.7 % (ref 4.0–12.0)
BKR WAM MPV: 10.9 fL (ref 8.0–12.0)
BKR WAM NEUTROPHILS: 55.8 % (ref 39.0–72.0)
BKR WAM NUCLEATED RED BLOOD CELLS: 0 % (ref 0.0–1.0)
BKR WAM PLATELETS: 706 x1000/ÂµL — ABNORMAL HIGH (ref 150–420)
BKR WAM RDW-CV: 17.7 % — ABNORMAL HIGH (ref 11.0–15.0)
BKR WAM RED BLOOD CELL COUNT.: 2.76 M/??L — ABNORMAL LOW (ref 4.00–6.00)
BKR WAM WHITE BLOOD CELL COUNT: 1.2 x1000/??L — ABNORMAL LOW (ref 4.0–11.0)

## 2022-06-10 LAB — COMPREHENSIVE METABOLIC PANEL
BKR A/G RATIO: 1.5 (ref 1.0–2.2)
BKR ALANINE AMINOTRANSFERASE (ALT): 18 U/L (ref 9–59)
BKR ALBUMIN: 3.9 g/dL (ref 3.6–5.1)
BKR ALKALINE PHOSPHATASE: 65 U/L (ref 9–122)
BKR ANION GAP: 10 (ref 7–17)
BKR ASPARTATE AMINOTRANSFERASE (AST): 75 U/L — ABNORMAL HIGH (ref 10–35)
BKR AST/ALT RATIO: 4.2 % (ref 0.0–1.0)
BKR BLOOD UREA NITROGEN: 14 mg/dL (ref 8–23)
BKR BUN / CREAT RATIO: 15.2 % — ABNORMAL HIGH (ref 8.0–23.0)
BKR CALCIUM: 8.8 mg/dL (ref 8.8–10.2)
BKR CHLORIDE: 109 mmol/L — ABNORMAL HIGH (ref 98–107)
BKR CO2: 21 mmol/L — ABNORMAL LOW (ref 20–30)
BKR EGFR, CREATININE (CKD-EPI 2021): >60 mL/min/{1.73_m2} (ref >=60)
BKR GLUCOSE: 100 mg/dL (ref 70–100)
BKR POTASSIUM: 5.4 mmol/L — ABNORMAL HIGH (ref 3.3–5.3)
BKR PROTEIN TOTAL: 6.5 g/dL — ABNORMAL HIGH (ref 5.9–8.3)
BKR SODIUM: 140 mmol/L (ref 136–144)
BKR WAM ANALYZER ANC: >60 mL/min/1.73m2 — ABNORMAL LOW (ref >=60–7.60)
BKR WAM LYMPHOCYTES: 65 U/L (ref 9–122)

## 2022-06-10 LAB — URIC ACID: BKR URIC ACID: 3.2 mg/dL (ref 2.7–7.3)

## 2022-06-10 LAB — PHOSPHORUS     (BH GH L LMW YH): BKR PHOSPHORUS: 3.2 mg/dL (ref 2.2–4.5)

## 2022-06-10 MED ORDER — MIDAZOLAM 1 MG/ML INJECTION SOLUTION
1 mg/mL | Status: CP
Start: 2022-06-10 — End: ?

## 2022-06-10 MED ORDER — ACETAMINOPHEN 325 MG TABLET
325 mg | Freq: Four times a day (QID) | ORAL | Status: DC | PRN
Start: 2022-06-10 — End: 2022-06-10

## 2022-06-10 MED ORDER — SODIUM CHLORIDE 0.9 % (FLUSH) INJECTION SYRINGE
0.9 % | Freq: Three times a day (TID) | INTRAVENOUS | Status: DC
Start: 2022-06-10 — End: 2022-06-10

## 2022-06-10 MED ORDER — SODIUM CHLORIDE 0.9 % (FLUSH) INJECTION SYRINGE
0.9 % | INTRAVENOUS | Status: DC | PRN
Start: 2022-06-10 — End: 2022-06-10

## 2022-06-10 MED ORDER — IBUPROFEN 600 MG TABLET
600 mg | Freq: Four times a day (QID) | ORAL | Status: DC | PRN
Start: 2022-06-10 — End: 2022-06-10

## 2022-06-10 MED ORDER — OXYCODONE IMMEDIATE RELEASE 5 MG TABLET
5 mg | ORAL | Status: DC | PRN
Start: 2022-06-10 — End: 2022-06-10

## 2022-06-10 MED ORDER — FENTANYL (PF) 50 MCG/ML INJECTION SOLUTION
50 mcg/mL | Status: CP
Start: 2022-06-10 — End: ?

## 2022-06-10 MED ORDER — ONDANSETRON HCL (PF) 4 MG/2 ML INJECTION SOLUTION
42 mg/2 mL | Freq: Four times a day (QID) | INTRAVENOUS | Status: DC | PRN
Start: 2022-06-10 — End: 2022-06-10

## 2022-06-10 MED ORDER — ONDANSETRON 4 MG DISINTEGRATING TABLET
4 mg | Freq: Four times a day (QID) | ORAL | Status: DC | PRN
Start: 2022-06-10 — End: 2022-06-10

## 2022-06-10 NOTE — Procedures
[facility]-YscBrief Procedure Note:Patient name: Douglas [name] VHQ:[id number] Date of procedure: 4/24/2024Procedure(s): R IJ tunneled central venous catheter placementSedation method: Moderate Sedation. Patient reassessed immediately prior to the procedure before sedation administered.Pre Procedure Diagnosis:CMML with concern for conversion to AMLPost Procedure Diagnosis:sameIndications: 72 y.o. 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 Primary Attending: Surgeon(s) and Role:   * Santina Evans, MD - PrimaryAssistant: Violeta Gelinas, PGY-2Anesthesia: NoneSpecimen(s) removed:none  Additional studies ordered: NoneDrains: NoneEstimated Blood Loss: MinimalComplications: NoneVascular closure method:n/aFindings/Notes/Comments:On initial imaging the right internal jugular vein was noted to be patent and appropriate for access.  Successful placement of right internal jugular tunneled central venous catheter.  There was a small amount of oozing from the dermatotomy after the procedure and a pressure dressing was applied over the antiseptic dressing.  Plan:Tunneled central venous catheter okay to use.Okay for diet per IRPRN pain and nausea controlMonitor catheter site for ongoing bleeding.  Verbal Orders and Supervisory Attestation: As the procedural physician / APP, I verify I was present throughout the entire procedure and provided physician orders as documented in the specialty electronic documentation system. Furthermore I directly supervised all non physicians providers who assisted me during the procedure and I accept delegation of supervisory responsibility for all post procedure patient care activity related to this patient that I request from a [facility] PA/APRN/staff member.Shelly Coss, MD4/24/20241:06 PM

## 2022-06-10 NOTE — Telephone Encounter
noted 

## 2022-06-10 NOTE — Discharge Instructions
Tunneled Catheter- HickmanPROCEDUREA tunneled central line is a catheter that is placed in a vein for long term use. A tunneled central line is a soft, flexible tube that is tunneled under the skin. It has a cuff attached to it, which allows tissue and skin to grow around it. This gives the line more stability, as well as lowering the risk for infection. The tip of the central line ends in a large vein (superior vena cava) just above the heart. A tunneled catheter is a more comfortable way for a patient to receive medications such as chemotherapy, blood products, nutrition and fluids, and also allows the medical team to obtain blood samples. It is most commonly placed in the neck but may also be placed in the groin.ACTIVITYAvoid heavy physical activity, driving, and drinking alcohol for 24 hours. You may have tenderness or pain at the site for the next 24-48 hours. Avoid doing things that cause pain or discomfort.DIET/MEDICATIONSYou may resume your normal diet and medications. You may resume anticoagulant (blood thinning) medications tomorrow.  CARE INSTRUCTIONSDressing: You will have a transparent dressing placed over your catheter. Be sure to keep it clean and dry. Your dressing should be changed every seven days or sooner if it becomes soiled or loosened. It should be changed for the first time by a healthcare professional, who can give you instructions on future dressing changes and flushing. Check the catheter site for redness or irritation during dressing changes. Call your caregiver if there is pus-like discharge, redness, swelling, or discomfort where the central line enters the skin. You also have a small skin nick site at the base of your neck. This may have small adhesive strips and glue, which will fall off on their own. Cleaning:Do not submerge the site in water or allow it to get wet. No tub baths or hot tubs. Do not pull or tug on catheter. The hickman catheter must be flushed periodically. You should have a plan in place for flushing your hickman catheter. If you do not have a plan, contact your referring provider immediately. Do not use scissors near your catheter. If your catheter is leaking, cut, or there is a tear or break in your catheter, pinch your catheter above the location of the tear and call your provider immediately.FOLLOW-UPRefer to your ordering physician to arrange for ongoing care of your catheter.  Please contact your referring physician if you develop any of the following:	 Chills or a fever of 100.4? F or greater	 Shortness of breath or difficulty breathing	 Chest pain or irregular heart beats	 Dizziness or syncope	 Swelling in your neck, face, chest or arm on the side of your central linePlease contact your IR provider if you develop any of the following:	 Bleeding from the insertion site that does not stop	 Pain, redness, swelling or drainage at the insertion site	 The central line is not working properly such as: It will not flushYou do not get a blood return from the central lineYou develop a hole, leak, or tear in the catheterCONTACT INFORMATIONIf you have any questions or problems, please call Interventional Radiology at [phone]).

## 2022-06-10 NOTE — Plan of Care
Problem: Procedural Patient Plan of CareGoal: Plan of Care ReviewOutcome: Interventions implemented as appropriateGoal: Patient-Specific Goal (Individualized)Outcome: Interventions implemented as appropriateGoal: Absence of Hospital-Acquired Illness or InjuryOutcome: Interventions implemented as appropriateGoal: Optimal Comfort and WellbeingOutcome: Interventions implemented as appropriateGoal: Readiness for Transition of CareOutcome: Interventions implemented as appropriateGoal: Recovery from Sedation EffectsOutcome: Interventions implemented as appropriate Plan of Care Overview/ Patient Status

## 2022-06-10 NOTE — H&P
[place] Interventional RadiologyHistory of Present Illness Douglas [name] is a 72 y.o. 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)   CLL (chronic lymphocytic leukemia) (HC Code)   Depression   DOE (dyspnea on exertion)   Enlarged prostate   Essential hypertension   Family history of ischemic heart disease   Glaucoma   Hypertensive heart disease without heart failure   Mixed hyperlipidemia   Past Surgical History Past Surgical History: Procedure Laterality Date  ADENOIDECTOMY    BONE MARROW BIOPSY    PROSTATE BIOPSY    TONSILLECTOMY    VASCULAR SURGERY    Family History Family History Problem Relation Age of Onset  Breast cancer Mother   Liver cancer Mother   Heart disease Father   Prostate cancer Father   Breast cancer Sister   Thyroid disease Sister   Hearing loss Sister   Diabetes Daughter   No Known Problems Daughter   No Known Problems Grandchild   Social History Social History Tobacco Use  Smoking status: Never  Smokeless tobacco: Never Substance Use Topics  Alcohol use: Yes   Alcohol/week: 2.0 standard drinks of alcohol   Types: 2 Cans of beer per week   Comment: social drinker once a week  He reports no history of drug use.   Medications  No current facility-administered medications on file prior to encounter. Current Outpatient Medications on File Prior to Encounter Medication Sig Dispense Refill  acyclovir (ZOVIRAX) 400 mg tablet Take 1 tablet (400 mg total) by mouth 2 (two) times daily. 60 tablet 5  allopurinoL (ZYLOPRIM) 300 mg tablet Take 1 tablet (300 mg total) by mouth daily. 30 tablet 0  aspirin 81 mg EC delayed release tablet Take 1 tablet (81 mg total) by mouth.    atorvastatin (LIPITOR) 40 mg tablet take 1 tablet by mouth at bedtime    cholecalciferol (VITAMIN D-3) 50 mcg (2,000 unit) capsule Take 1 capsule (2,000 Units total) by mouth daily.    coenzyme Q10 300 mg Cap Take 300 mg by mouth.    finasteride (PROSCAR) 5 mg tablet TAKE 1 TABLET BY MOUTH DAILY FOR ENLARGED PROSTATE WITH URINATION PROBLEM TAKE WITH OR WITHOUT MEALS    levoFLOXacin (LEVAQUIN) 500 mg tablet Take 1 tablet (500 mg total) by mouth daily. 30 tablet 5  lisinopriL (PRINIVIL,ZESTRIL) 40 mg tablet take 1 tablet by mouth every day in the morning    magnesium oxide (MAG-OX) 400 mg (241.3 mg magnesium) tablet Take 1 tablet (400 mg total) by mouth.    metoprolol succinate XL (TOPROL-XL) 50 mg 24 hr tablet Take 1 tablet (50 mg total) by mouth daily.    voriconazole (VFEND) 200 mg tablet Take 1 tablet (200 mg total) by mouth every 12 (twelve) hours. (Patient not taking: Reported on 06/06/2022) 30 tablet 5   Allergies No Known Allergies Objective Data Vital SignsVitals:  06/10/22 1034 BP: (!) 151/56 Pulse: 69 Temp: (!) 96.9 ?F (36.1 ?C) Physical ExamNeuro: Awake and alert in no acute distressAirway: Class III: soft palate, base of uvulaHeart: Warm, perfusedLungs: No respiratory distressAbdomen: Non-tenderLaboratory ResultsChemistry:Recent Labs Lab 04/20/241047 04/24/240755 NA 138 140 K 4.4 5.4* CL 107 109* CO2  20 21 BUN 17 14 CREATININE 0.97 0.92 Complete Blood Count:Recent Labs Lab 04/20/241047 04/24/240755 WBC 1.2* 1.2* HGB 7.6* 7.8* HCT 25.70* 26.40* PLT 690* 706* Liver Function Tests:Recent Labs Lab 04/20/241047 04/24/240755 AST 34 75* ALT 20 18 ALKPHOS 72 65 BILITOT 0.5 0.5 Coagulation Studies:No results for input(s): PTT, LABPROT, INR in the last 168 hours.Microbiology:No results for input(s): LABBLOO, LABURIN, LOWERRESPIRA in the last 168 hours.Assessment and Plan AssessmentSteven KELE [name] is a 72 y.o. 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, who presents for tunneled central venous catheter placement.Plan- Proceed with tunneled central venous catheter placement as planned.- okay for diet and discharge once meeting criteria post procedure.With urgent questions or concerns, please contact IR at: Inpatient -For [facility]: Page  INTERVENTIONAL RADIOLOGY CONSULT - IR - VASCULAR [facility] via Smart Web-For [facility]: Page  INTERVENTIONAL RADIOLOGY CONSULT - IR - VASCULAR [facility] via Smart Web- For [employer]: Use pager [phone] Outpatient (phone, all locations) - [phone] Merla Riches Ronne Binning, MD 06/10/2022

## 2022-06-10 NOTE — Progress Notes
Patient arrives ambulatory with his wife and daughter for labs poss. LPIV #22g placed, +BR/flush, labs drawn as ordered.Hgb 7.8, plts 706, and ANC 0.67.Reviewed s/s of anemia and patient denies experiencing any of the mentioned s/s. In turn, he decided he would not like pRBCs this visit. Donald Siva APRN notified. Continues on acyclovir and levofloxacin for prophylaxis. Per patient, he is waiting for insurance auth for vori.Reviewed his medications, although he did not have his pill bottles with him and in turn there was some confusion. Instructed patient to bring his pill bottles with him to Monday appointment so we can review his medications in detail and ensure he is taking the correct ones. He verbalized an understanding.Patient reports feeling well, no new sx/complaints. See flow-sheet for full assessment. AVS given, d/c in stable condition to IR for Hickman placement. RTC Sun 4/28 for labs poss (originally scheduled on Sat 4/27, patient request to change appointment because wife and patient are staying in a hotel in [facility] night to be close to [place] AM appointment for [place] guided bx). Mon 4/29 for [place] guided bx and C1D1 Decitabine + Ven. In-basket sent to Dr. Sherrell Puller and Donald Siva APRN in regards to consent.

## 2022-06-10 NOTE — Telephone Encounter
Douglas [name] from [place] lab calling with test cancellation  LDH Hemolyzed and is place for redraw

## 2022-06-10 NOTE — Other
HVC HANDOFF REPORTTo view medication administration, contrast administration,vital signs, and procedure details:Procedure documented in: Non-Epic: see Intra Procedure Log: (To access Intra Procedure Log: go to chart review - media tab - look for document type: Intra Procedure Log).Verbal Report given to: RN Procedural Nurse/Contact Number: Dedra Skeens     Performing Provider/Contact Number: Jodi Marble, MDProcedure performed: RIJ DL Hickman Tunneled Catheter PlacementAdditional comments: Fentanyl and Versed 2mg  givenWas procedure done with anesthesia Support? No Last Vital Signs:Time VS taken: 1243Heart Rate: 65B/P: 142/68/97Respiratory Rate: 14  O2 Sat.: 95%Pain Level/ Intervention: 0LOC and/or current Mental Status: A/O x 4Was procedure performed with moderate sedation: Yes: see procedural documentation for medications administeredPatient transferring on Oxygen: NoProcedural Access Site: Non-Vascular:   See Epic documentation for LDAsElectronically signed:Hina Gupta Dareen Piano, RN 06/10/2022, 12:30 PM

## 2022-06-10 NOTE — Anesthesia Procedure Notes
History & Physical Attestation and Pre-Procedural Moderate Sedation Assessment History & Physical Attestation: This is a non-emergent procedure. I have completed (or reviewed and attested to) a History and Physical Exam written within the last 30 days, which is in the patient record. The patient has been assessed and examined prior to this procedure and no changes were noted unless otherwise documented here: nonePhysician Airway Assessment:Normal: As the licensed practitioner certified in moderate sedation and based on my review, immediately prior to this procedure, of the airway evaluation and other pertinent documentation in the patient's record, this patient is a suitable candidate for moderate sedation during the planned procedure.Procedural Sedation Risk Assessment:Additional risks, if any, related to procedural sedation are noted here: None

## 2022-06-10 NOTE — Telephone Encounter
Spoke with the [place] family in clinic and all appointments are set and they are aware. They appreciated all the effort made to make appointments happen.

## 2022-06-12 ENCOUNTER — Telehealth: Admit: 2022-06-12 | Payer: PRIVATE HEALTH INSURANCE | Attending: Hematology & Oncology | Primary: Internal Medicine

## 2022-06-12 ENCOUNTER — Encounter: Admit: 2022-06-12 | Payer: PRIVATE HEALTH INSURANCE | Primary: Internal Medicine

## 2022-06-12 ENCOUNTER — Encounter
Admit: 2022-06-12 | Payer: PRIVATE HEALTH INSURANCE | Attending: Pharmacist Clinician (PhC)/ Clinical Pharmacy Specialist | Primary: Internal Medicine

## 2022-06-12 DIAGNOSIS — C9200 Acute myeloblastic leukemia, not having achieved remission: Secondary | ICD-10-CM

## 2022-06-12 LAB — CHROMOSOME ANALYSIS (CYTOGENETICS) (YMG)
BKR BANDING RESOLUTION: 400
BKR CYTOMEGALOVIRUS IGG ANTIBODY: NEGATIVE

## 2022-06-12 MED ORDER — VENETOCLAX 100 MG TABLET
100 mg | ORAL_TABLET | 1 refills | 30.00 days | Status: AC
Start: 2022-06-12 — End: ?
  Filled 2022-06-15: qty 120, 30d supply, fill #0

## 2022-06-12 NOTE — Other
Spoke w patient in regards to upcoming [place] Guided Bone Marrow Bx appt this Monday 06/15/22 and discussed the following: Arrival time: 7:40am for 8:40am procedure timeAddress: [address] [place], [place] 2nd FloorParking: [employer] ([address] on even number floors,4, 6, 8) or [place] at address above.Ride: Wife Okey Regal will accompany patient to appt.NPO: No food after Midnight, sips of water OK w meds in AM, OK to drink larger amounts of water 2hrs prior to arrival time.Questions: Discussed with patient in regards to procedure and how it will be similar to those he had in [place] (had 2 prior, 2023 & 2024). Deferred patient to care team in regards to Dentist appt next Thurs and if it was okay to do so with chemo medication.Labs: Called MD [facility] office and requested routine Bone Marrow Bx labs to be ordered. Staff verbalized understanding. MH Sent to MD Sherrell Puller to put in orders, message read.

## 2022-06-12 NOTE — Telephone Encounter
Patient returning LVM by nursing call transferred to nursing

## 2022-06-13 ENCOUNTER — Ambulatory Visit: Admit: 2022-06-13 | Payer: MEDICARE | Primary: Internal Medicine

## 2022-06-14 ENCOUNTER — Inpatient Hospital Stay: Admit: 2022-06-14 | Discharge: 2022-06-14 | Payer: MEDICARE

## 2022-06-14 DIAGNOSIS — C9200 Acute myeloblastic leukemia, not having achieved remission: Secondary | ICD-10-CM

## 2022-06-14 LAB — MANUAL DIFFERENTIAL
BKR WAM ATYPICAL LYMPHOCYTES (DIFF) 1 DEC: 4 % — ABNORMAL HIGH (ref 0.0–1.0)
BKR WAM BASOPHIL - ABS (DIFF) 2 DEC: 0.02 x 1000/??L (ref 0.00–1.00)
BKR WAM BASOPHILS (DIFF): 1 % (ref 0.0–1.4)
BKR WAM BLASTS (DIFF) (1 DEC): 3 % — CR (ref 0.0–0.0)
BKR WAM EOSINOPHILS (DIFF) 2 DEC: 0 x 1000/??L (ref 0.00–1.00)
BKR WAM EOSINOPHILS (DIFF): 0 % (ref 0.0–5.0)
BKR WAM LYMPHOCYTE - ABS (DIFF) 2 DEC: 0.59 x 1000/??L — ABNORMAL LOW (ref 0.60–3.70)
BKR WAM LYMPHOCYTES (DIFF): 23 % (ref 17.0–50.0)
BKR WAM MONOCYTE - ABS (DIFF) 2 DEC: 0.04 x 1000/??L (ref 0.00–1.00)
BKR WAM MONOCYTES (DIFF): 2 % — ABNORMAL LOW (ref 4.0–12.0)
BKR WAM NEUTROPHILS (DIFF): 67 % (ref 39.0–72.0)
BKR WAM NEUTROPHILS - ABS (DIFF) 2 DEC: 1.47 x 1000/??L — ABNORMAL LOW (ref 2.00–7.60)

## 2022-06-14 LAB — CBC WITH AUTO DIFFERENTIAL
BKR WAM ABSOLUTE NRBC (2 DEC): 0.02 x 1000/??L (ref 0.00–1.00)
BKR WAM HEMATOCRIT (2 DEC): 24 % — ABNORMAL LOW (ref 38.50–50.00)
BKR WAM HEMOGLOBIN: 7.3 g/dL — ABNORMAL LOW (ref 13.2–17.1)
BKR WAM MCH (PG): 28.4 pg (ref 27.0–33.0)
BKR WAM MCHC: 30.4 g/dL — ABNORMAL LOW (ref 31.0–36.0)
BKR WAM MCV: 93.4 fL — ABNORMAL HIGH (ref 80.0–100.0)
BKR WAM MPV: 10.7 fL (ref 8.0–12.0)
BKR WAM NUCLEATED RED BLOOD CELLS: 0.9 % (ref 0.0–1.0)
BKR WAM PLATELETS: 727 x1000/ÂµL — ABNORMAL HIGH (ref 150–420)
BKR WAM RDW-CV: 18.6 % — ABNORMAL HIGH (ref 11.0–15.0)
BKR WAM RED BLOOD CELL COUNT.: 2.57 M/??L — ABNORMAL LOW (ref 4.00–6.00)
BKR WAM WHITE BLOOD CELL COUNT: 2.2 x1000/??L — ABNORMAL LOW (ref 4.0–11.0)

## 2022-06-14 LAB — COMPREHENSIVE METABOLIC PANEL
BKR A/G RATIO: 1.7 (ref 1.0–2.2)
BKR ALANINE AMINOTRANSFERASE (ALT): 11 U/L (ref 9–59)
BKR ALBUMIN: 4.1 g/dL (ref 3.6–5.1)
BKR ALKALINE PHOSPHATASE: 79 U/L (ref 9–122)
BKR ANION GAP: 11 g/dL — ABNORMAL LOW (ref 7–17)
BKR ASPARTATE AMINOTRANSFERASE (AST): 14 U/L (ref 10–35)
BKR AST/ALT RATIO: 1.3
BKR BILIRUBIN TOTAL: 0.5 mg/dL (ref <=1.2)
BKR BLOOD UREA NITROGEN: 13 mg/dL — ABNORMAL HIGH (ref 8–23)
BKR BUN / CREAT RATIO: 13 x 1000/??L (ref 8.0–23.0)
BKR CALCIUM: 8.8 mg/dL (ref 8.8–10.2)
BKR CHLORIDE: 108 mmol/L — ABNORMAL HIGH (ref 98–107)
BKR CO2: 22 mmol/L (ref 20–30)
BKR CREATININE: 1 mg/dL (ref 0.40–1.30)
BKR EGFR, CREATININE (CKD-EPI 2021): >60 mL/min/{1.73_m2} (ref >=60)
BKR GLOBULIN: 2.4 g/dL (ref 2.0–3.9)
BKR GLUCOSE: 116 mg/dL — ABNORMAL HIGH (ref 70–100)
BKR POTASSIUM: 3.9 mmol/L — ABNORMAL LOW (ref 3.3–5.3)
BKR PROTEIN TOTAL: 6.5 g/dL (ref 5.9–8.3)
BKR SODIUM: 141 mmol/L — ABNORMAL LOW (ref 136–144)

## 2022-06-14 MED ORDER — ACETAMINOPHEN 325 MG TABLET
325 mg | Freq: Once | ORAL | Status: CP
Start: 2022-06-14 — End: ?
  Administered 2022-06-14: 15:00:00 325 mg via ORAL

## 2022-06-14 MED ORDER — EPINEPHRINE 0.3 MG/0.3 ML INJECTION, AUTO-INJECTOR
0.30.3 mg/ mL | INTRAMUSCULAR | Status: DC | PRN
Start: 2022-06-14 — End: 2022-06-19

## 2022-06-14 MED ORDER — ALBUTEROL SULFATE 2.5 MG/3 ML (0.083 %) SOLUTION FOR NEBULIZATION
2.530.083 mg /3 mL (0.083 %) | RESPIRATORY_TRACT | Status: DC | PRN
Start: 2022-06-14 — End: 2022-06-19

## 2022-06-14 MED ORDER — FAMOTIDINE 4 MG/ML IN 0.9% SODIUM CHLORIDE (ADULT)
Freq: Once | INTRAVENOUS | Status: DC | PRN
Start: 2022-06-14 — End: 2022-06-19

## 2022-06-14 MED ORDER — DIPHENHYDRAMINE 50 MG/ML INJECTION (WRAPPED E-RX)
50 mg/mL | Freq: Once | INTRAVENOUS | Status: DC | PRN
Start: 2022-06-14 — End: 2022-06-19

## 2022-06-14 MED ORDER — SODIUM CHLORIDE 0.9 % BOLUS (NEW BAG)
0.9 % | Freq: Once | INTRAVENOUS | Status: DC | PRN
Start: 2022-06-14 — End: 2022-06-19

## 2022-06-14 MED ORDER — DIPHENHYDRAMINE 25 MG CAPSULE
25 mg | Freq: Once | ORAL | Status: CP
Start: 2022-06-14 — End: ?
  Administered 2022-06-14: 15:00:00 25 mg via ORAL

## 2022-06-14 MED ORDER — HYDROCORTISONE SODIUM SUCCINATE 100 MG SOLUTION FOR INJECTION
100 mg | Freq: Once | INTRAVENOUS | Status: DC | PRN
Start: 2022-06-14 — End: 2022-06-19

## 2022-06-14 NOTE — Progress Notes
Reports for labs poss. Denies problems other than issues with obtaining prescriptions. He has new hickman placement(placed on Wed). Dressing is partially attached only, with exposed guaze with dry blood. Site is not currently bleeding. There is some redness and edema at the supraclavicular site. It does not appear to be infected. It is closed by glue. Patient instructed to allow glue to come off on its own. Site is not painful or tender.  Labs obtained and dressing change completed. Patient  instructed on importance of clean dry intact dressing. Hgb returned at 7.3. Platelet count at 727 and ANC at 1.47.  Premeds administered and 1 unit RBCs transfused and tolerated well. I  have informed him that I will notify his team of the high cost of the ventoclax and voriconazole. He does currently continue on his levaquin and acyclovir. He will return tomorrow for [place] guided biopsy and to start his decitabine. He staying overnight in the suites. Left ambulatory in stable condition. Provided with AVS.Electronically Signed by Alfred Levins, RN, June 14, 2022

## 2022-06-15 ENCOUNTER — Encounter: Admit: 2022-06-15 | Payer: PRIVATE HEALTH INSURANCE | Attending: Hematology | Primary: Internal Medicine

## 2022-06-15 ENCOUNTER — Inpatient Hospital Stay: Admit: 2022-06-15 | Discharge: 2022-06-15 | Payer: MEDICARE

## 2022-06-15 ENCOUNTER — Telehealth: Admit: 2022-06-15 | Payer: PRIVATE HEALTH INSURANCE | Attending: Hematology & Oncology | Primary: Internal Medicine

## 2022-06-15 ENCOUNTER — Encounter: Admit: 2022-06-15 | Payer: PRIVATE HEALTH INSURANCE | Attending: Hematology & Oncology | Primary: Internal Medicine

## 2022-06-15 ENCOUNTER — Ambulatory Visit: Admit: 2022-06-15 | Payer: MEDICARE | Attending: Hematology | Primary: Internal Medicine

## 2022-06-15 ENCOUNTER — Ambulatory Visit: Admit: 2022-06-15 | Payer: PRIVATE HEALTH INSURANCE | Primary: Internal Medicine

## 2022-06-15 DIAGNOSIS — C9200 Acute myeloblastic leukemia, not having achieved remission: Secondary | ICD-10-CM

## 2022-06-15 DIAGNOSIS — C931 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: Secondary | ICD-10-CM

## 2022-06-15 DIAGNOSIS — R791 Abnormal coagulation profile: Secondary | ICD-10-CM

## 2022-06-15 DIAGNOSIS — C9290 Myeloid leukemia, unspecified, not having achieved remission: Secondary | ICD-10-CM

## 2022-06-15 LAB — COMPREHENSIVE METABOLIC PANEL
BKR A/G RATIO: 1.5 (ref 1.0–2.2)
BKR ALANINE AMINOTRANSFERASE (ALT): 16 U/L (ref 9–59)
BKR ALKALINE PHOSPHATASE: 81 U/L (ref 9–122)
BKR ANION GAP: 10 g/dL — ABNORMAL LOW (ref 7–17)
BKR ASPARTATE AMINOTRANSFERASE (AST): 39 U/L — ABNORMAL HIGH (ref 10–35)
BKR AST/ALT RATIO: 2.4
BKR BILIRUBIN TOTAL: 0.8 mg/dL — ABNORMAL HIGH (ref 0.0–<=1.2)
BKR BLOOD UREA NITROGEN: 14 mg/dL (ref 8–23)
BKR BUN / CREAT RATIO: 14.6 (ref 8.0–23.0)
BKR CHLORIDE: 108 mmol/L — ABNORMAL HIGH (ref 98–107)
BKR CO2: 22 mmol/L (ref 20–30)
BKR CREATININE: 0.96 mg/dL (ref 0.40–1.30)
BKR EGFR, CREATININE (CKD-EPI 2021): >60 mL/min/{1.73_m2} (ref >=60)
BKR GLOBULIN: 2.6 g/dL (ref 2.0–3.9)
BKR GLUCOSE: 112 mg/dL — ABNORMAL HIGH (ref 70–100)
BKR POTASSIUM: 4.3 mmol/L (ref 3.3–5.3)
BKR PROTEIN TOTAL: 6.6 g/dL (ref 5.9–8.3)
BKR SODIUM: 140 mmol/L (ref 136–144)

## 2022-06-15 LAB — CBC WITH AUTO DIFFERENTIAL
BKR ALBUMIN: 11.3 fL (ref 8.0–12.0)
BKR CALCIUM: 30.7 g/dL — ABNORMAL LOW (ref 31.0–36.0)
BKR WAM ABSOLUTE NRBC (2 DEC): 0.02 x 1000/??L (ref 0.00–1.00)
BKR WAM HEMATOCRIT (2 DEC): 25.4 % — ABNORMAL LOW (ref 38.50–50.00)
BKR WAM HEMOGLOBIN: 7.8 g/dL — ABNORMAL LOW (ref 13.2–17.1)
BKR WAM MCH (PG): 28.5 pg (ref 27.0–33.0)
BKR WAM MCHC: 30.7 g/dL — ABNORMAL LOW (ref 31.0–36.0)
BKR WAM MCV: 92.7 fL (ref 80.0–100.0)
BKR WAM MPV: 11.3 fL (ref 8.0–12.0)
BKR WAM NUCLEATED RED BLOOD CELLS: 1.2 % — ABNORMAL HIGH (ref 0.0–1.0)
BKR WAM PLATELETS: 703 x1000/??L — ABNORMAL HIGH (ref 150–420)
BKR WAM RDW-CV: 19.9 % — ABNORMAL HIGH (ref 11.0–15.0)
BKR WAM RED BLOOD CELL COUNT.: 2.74 M/??L — ABNORMAL LOW (ref 4.00–6.00)
BKR WAM WHITE BLOOD CELL COUNT: 1.6 x1000/ÂµL — ABNORMAL LOW (ref 4.0–11.0)

## 2022-06-15 LAB — MANUAL DIFFERENTIAL
BKR WAM BASOPHIL - ABS (DIFF) 2 DEC: 0.03 x 1000/??L (ref 0.00–1.00)
BKR WAM BASOPHILS (DIFF): 2 % — ABNORMAL HIGH (ref 0.0–1.4)
BKR WAM BLASTS (DIFF) (1 DEC): 1 % — CR (ref 0.0–0.0)
BKR WAM EOSINOPHILS (DIFF) 2 DEC: 0 x 1000/??L (ref 0.00–1.00)
BKR WAM EOSINOPHILS (DIFF): 0 % (ref 0.0–5.0)
BKR WAM LYMPHOCYTE - ABS (DIFF) 2 DEC: 0.64 x 1000/??L (ref 0.60–3.70)
BKR WAM LYMPHOCYTES (DIFF): 40 % (ref 17.0–50.0)
BKR WAM MONOCYTE - ABS (DIFF) 2 DEC: 0.06 x 1000/??L (ref 0.00–1.00)
BKR WAM MONOCYTES (DIFF): 4 % (ref 4.0–12.0)
BKR WAM NEUTROPHILS (DIFF): 53 % (ref 39.0–72.0)
BKR WAM NEUTROPHILS - ABS (DIFF) 2 DEC: 0.85 x 1000/??L — ABNORMAL LOW (ref 2.00–7.60)

## 2022-06-15 LAB — URIC ACID: BKR URIC ACID: 3.7 mg/dL (ref 2.7–7.3)

## 2022-06-15 LAB — FIBRINOGEN     (BH GH L LMW YH): BKR FIBRINOGEN LEVEL: 404 mg/dL (ref 194–448)

## 2022-06-15 LAB — PT/INR AND PTT (BH GH L LMW YH)
BKR INR: 1.21 — ABNORMAL HIGH (ref 0.86–1.12)
BKR PROTHROMBIN TIME: 13.3 seconds — ABNORMAL HIGH (ref 9.6–12.3)

## 2022-06-15 LAB — PHOSPHORUS     (BH GH L LMW YH): BKR PHOSPHORUS: 2.8 mg/dL (ref 2.2–4.5)

## 2022-06-15 LAB — BONE MARROW SMEAR, ASPIRATION, AND STAIN      (L YH)

## 2022-06-15 MED ORDER — FLUMAZENIL 0.1 MG/ML INTRAVENOUS SOLUTION
0.1 mg/mL | Status: CP
Start: 2022-06-15 — End: ?

## 2022-06-15 MED ORDER — FENTANYL (PF) 50 MCG/ML INJECTION SOLUTION
50 mcg/mL | Status: CP
Start: 2022-06-15 — End: ?

## 2022-06-15 MED ORDER — NALOXONE 0.4 MG/ML INJECTION SOLUTION
0.4 mg/mL | Status: CP
Start: 2022-06-15 — End: ?

## 2022-06-15 MED ORDER — ACETAMINOPHEN 325 MG TABLET
325 mg | Freq: Once | ORAL | Status: CP
Start: 2022-06-15 — End: ?
  Administered 2022-06-15: 17:00:00 325 mg via ORAL

## 2022-06-15 MED ORDER — FENTANYL (PF) 50 MCG/ML INJECTION SOLUTION
50 mcg/mL | Status: CP
Start: 2022-06-15 — End: ?
  Administered 2022-06-15 (×2): 50 mcg/mL

## 2022-06-15 MED ORDER — MIDAZOLAM (PF) 1 MG/ML INJECTION SOLUTION
1 mg/mL | Status: CP
Start: 2022-06-15 — End: ?
  Administered 2022-06-15 (×2): 1 mg/mL via INTRAVENOUS

## 2022-06-15 MED ORDER — LIDOCAINE HCL 10 MG/ML (1 %) INJECTION SOLUTION
101 mg/mL (1 %) | Freq: Once | INTRAMUSCULAR | Status: CP | PRN
Start: 2022-06-15 — End: ?
  Administered 2022-06-15: 13:00:00 10 mL via INTRAMUSCULAR

## 2022-06-15 MED ORDER — DIPHENHYDRAMINE 25 MG CAPSULE
25 mg | Freq: Once | ORAL | Status: CP
Start: 2022-06-15 — End: ?
  Administered 2022-06-15: 17:00:00 25 mg via ORAL

## 2022-06-15 MED ORDER — MIDAZOLAM 1 MG/ML INJECTION SOLUTION
1 mg/mL | Status: CP
Start: 2022-06-15 — End: ?

## 2022-06-15 MED ORDER — ONDANSETRON 8 MG DISINTEGRATING TABLET
8 mg | ORAL_TABLET | Freq: Two times a day (BID) | ORAL | 2 refills | 15.00 days | Status: AC | PRN
Start: 2022-06-15 — End: ?
  Filled 2022-06-16: qty 30, 15d supply, fill #0

## 2022-06-15 NOTE — Anesthesia Procedure Notes
Dressing applied. 

## 2022-06-15 NOTE — Anesthesia Procedure Notes
Site prepped  

## 2022-06-15 NOTE — Anesthesia Procedure Notes
Safety/Postitioning device applied for patient safety.

## 2022-06-15 NOTE — Anesthesia Procedure Notes
Specimen obtained.

## 2022-06-15 NOTE — Progress Notes
[facility] HealthMUSCULOSKELETAL RADIOLOGY BRIEF PROCEDURE NOTE	Patient Name: Douglas [name] [facility]: [id number] Procedure Date: 4/29/2024Physicians performing the procedure:Dr. Bertram Gala and Dr. LeeDIAGNOSIS:CMML evolving to [facility]: [employer] guided bone marrow biopsy with moderate sedation     COMPLICATIONS: No immediate post procedural complications. SPECIMENS:2 bone marrow aspirations and 1 core biopsy of the left iliac bone. Caroline Sauger, DO4/29/20248:13 AM

## 2022-06-15 NOTE — Discharge Instructions
[facility] HealthPOST [place] BIOPSY DISCHARGE INSTRUCTIONSYOUR PROCEDURE WAS PERFORMED BY DR. Winferd Humphrey Care:1.  Remove dressing after 24 hours (1 day).2.  Keep wound clean and dry.3.  May shower with the dressing on.4.  No swimming, hot tubs or tub bath for 5 days.Activity:For the next 24 hours do not drive, consume alcohol, make any legal decisions, operate heavy machinery or perform strenuous activity.Diet:Resume diet prior to procedure as tolerated. Good nutrition promotes healing.Medications:Resume all previous medications without change.Call your doctor if you have:Temperature greater than 100.4Persistent nausea and vomitingSevere uncontrolled painRedness, tenderness, or signs of infection (pain, swelling, redness, odor or green/yellow discharge around the site)Difficulty breathing, headache or visual disturbancesHivesPersistent dizziness or light-headednessExtreme fatigueAny other questions or concerns you may have after dischargeIn an emergency, call 911 or go to an Emergency Department at a nearby hospital.It is important to bring a complete, current list of your medications to any medical appointments or hospitalizations.Contact us:For any questions or concerns Monday-Friday 7:30am-5:00pm, call [phone] (Musculoskeletal Radiology).  Any other time please call [phone] and ask for the musculoskeletal imaging fellow on call.

## 2022-06-15 NOTE — Anesthesia Procedure Notes
Vital signs stable. 

## 2022-06-15 NOTE — Telephone Encounter
Anna from [place] lab called to report LDH hemolyzed and placed for redraw

## 2022-06-15 NOTE — Anesthesia Procedure Notes
Patient placed in a prone position on the table.

## 2022-06-15 NOTE — Telephone Encounter
Noted  

## 2022-06-15 NOTE — Other
Patient completed [place] guided Bone Marrow Biopsy under moderate sedation without complication. Patient tolerated the procedure well. Vital signs stable. Patient fully recovered from sedation and tolerated PO intake well without nausea or vomiting. The biopsy site dressing clean and intact. Hickman flushed prior to discharge. D/C instruction given and patient verbalized understanding. Patient brought down to elevator via wheelchairBP (!) 142/64  - Pulse 71  - Temp 97.9 ?F (36.6 ?C) (Temporal)  - Resp 18  - SpO2 100%

## 2022-06-15 NOTE — Progress Notes
History & Physical Attestation and Pre-Procedural Moderate Sedation Assessment History & Physical Attestation: This is a non-emergent procedure. I have completed (or reviewed and attested to) a History and Physical Exam written within the last 30 days, which is in the patient record. The patient has been assessed and examined prior to this procedure and no changes were noted unless otherwise documented here: NoneGeneral: Alert and Oriented x 3Heart: RRR Lungs: CTABReview of Systems: Patient does not report chest pain, fevers, chills, shortness of breath. Physician Airway Assessment:Normal: As the physician certified in moderate sedation and based on my review, immediately prior to this procedure, of the airway evaluation and other pertinent documentation in the patient's record, this patient is a suitable candidate for moderate sedation during the planned procedure.Procedural Sedation Risk Assessment: ASA IIAdditional risks, if any, related to procedural sedation are noted here: NoneASA Physical Status Classification Score: Patient is a completely healthy fit patient.Patient has mild systemic disease.Patient has severe systemic disease that is not incapacitating.Patient has incapacitating disease that is a constant threat to life.A moribund patient who is not expected to live 24 hour with or without surgery.

## 2022-06-16 ENCOUNTER — Encounter: Admit: 2022-06-16 | Payer: PRIVATE HEALTH INSURANCE | Primary: Internal Medicine

## 2022-06-16 ENCOUNTER — Telehealth: Admit: 2022-06-16 | Payer: PRIVATE HEALTH INSURANCE | Primary: Internal Medicine

## 2022-06-16 ENCOUNTER — Encounter
Admit: 2022-06-16 | Payer: PRIVATE HEALTH INSURANCE | Attending: Pharmacist Clinician (PhC)/ Clinical Pharmacy Specialist | Primary: Internal Medicine

## 2022-06-16 ENCOUNTER — Inpatient Hospital Stay: Admit: 2022-06-16 | Payer: PRIVATE HEALTH INSURANCE

## 2022-06-16 ENCOUNTER — Inpatient Hospital Stay: Admit: 2022-06-16 | Discharge: 2022-06-16 | Payer: MEDICARE

## 2022-06-16 DIAGNOSIS — Z79624 Long term (current) use of inhibitors of nucleotide synthesis: Secondary | ICD-10-CM

## 2022-06-16 DIAGNOSIS — Z79899 Other long term (current) drug therapy: Secondary | ICD-10-CM

## 2022-06-16 DIAGNOSIS — Z5111 Encounter for antineoplastic chemotherapy: Secondary | ICD-10-CM

## 2022-06-16 DIAGNOSIS — C9200 Acute myeloblastic leukemia, not having achieved remission: Secondary | ICD-10-CM

## 2022-06-16 MED ORDER — DECITABINE INFUSION
Freq: Once | INTRAVENOUS | Status: CP
Start: 2022-06-16 — End: ?
  Administered 2022-06-17: 18:00:00 100.000 mL/h via INTRAVENOUS

## 2022-06-16 MED ORDER — DIPHENHYDRAMINE 25 MG CAPSULE
25 mg | Freq: Once | ORAL | Status: DC
Start: 2022-06-16 — End: 2022-06-21

## 2022-06-16 MED ORDER — ONDANSETRON 8 MG DISINTEGRATING TABLET
8 mg | Freq: Once | Status: CP
Start: 2022-06-16 — End: ?
  Administered 2022-06-16: 14:00:00 8 mg

## 2022-06-16 MED ORDER — DECITABINE INFUSION
Freq: Once | INTRAVENOUS | Status: CP
Start: 2022-06-16 — End: ?
  Administered 2022-06-16: 14:00:00 100.000 mL/h via INTRAVENOUS

## 2022-06-16 MED ORDER — ACETAMINOPHEN 325 MG TABLET
325 mg | Freq: Once | ORAL | Status: DC
Start: 2022-06-16 — End: 2022-06-21

## 2022-06-16 NOTE — Progress Notes
Patient arrived in clinic for labs poss appointment Patient had [place] guided biopsy prior to arriving on unit Patient had labs yesterday and 1 unit PRBCS However per provider patient to return after IR appointment today.Labs were drawn via patent Hickman, labs as follows Complete Blood CountResults in Past 7 DaysResult Component Current Result Hematocrit 25.40 (L) (06/15/2022) Hemoglobin 7.8 (L) (06/15/2022) MCH 28.5 (06/15/2022) MCHC 30.7 (L) (06/15/2022) MCV 92.7 (06/15/2022) MPV 11.3 (06/15/2022) Platelets 703 (H) (06/15/2022) RBC 2.74 (L) (06/15/2022) WBC 1.6 (L) (06/15/2022)    Chemistry  Lab Results Component Value Date  NA 140 06/15/2022  K 4.3 06/15/2022  CL 108 (H) 06/15/2022  CO2 22 06/15/2022  BUN 14 06/15/2022  CREATININE 0.96 06/15/2022  GLU 112 (H) 06/15/2022  Lab Results Component Value Date  CALCIUM 8.8 06/15/2022  ALKPHOS 81 06/15/2022  AST 39 (H) 06/15/2022  ALT 16 06/15/2022  BILITOT 0.8 06/15/2022   Patient given 1 unit PRBCS via patent hickiman line , patient given pre meds tylenol /benadryl PO Patient tolerated PRBCS well, no s/s of reaction Paitent spoje with [facility] pharmacy today along with [facility] pharmacy about Venetoclex and cost , patient to receive supply this evening and start C1D1 of Ven/decitabine tomorrow when he returns.Patient seen by provider today and assessed.Patient arrive and d/c with spouse, pt d/c unit ambulating with steady gait.

## 2022-06-16 NOTE — Telephone Encounter
Spoke w/PATIENT  and EGD appt has been scheduled w/DR Herschel Senegal in [facility] on 06/25/2022.  Prep was reviewed and understood by pt and sent to pt via EMAIL/MYCHART. Pt understands hospital will call w/arrival time.  It was explained to pt he will get a confirmation call/text 14 days/10 days prior; if appt has not been confirmed it could be cancelled - pt understood.  Pt understands he will need someone to accompany him home.  Pharmacy was verified -  N/A

## 2022-06-16 NOTE — Progress Notes
Cycle 1 day 1 Venetoclax +DecitabinePatient had [place] guided Biopsy yesterday and also Seen by Donald Siva aprn and cleared for treatment today.Patient received his supply of venetoclax ramp up yesterday.instructions/education was given by Pharmacist.Pre meds with oral zofranDecitabine infusion given over 60 minutes without problem.Plan: rtc tomorrow for day 2,labs/possible and  [facility] labs.

## 2022-06-17 ENCOUNTER — Encounter: Admit: 2022-06-17 | Payer: PRIVATE HEALTH INSURANCE | Attending: Hematology | Primary: Internal Medicine

## 2022-06-17 ENCOUNTER — Inpatient Hospital Stay: Admit: 2022-06-17 | Discharge: 2022-06-17 | Payer: MEDICARE

## 2022-06-17 DIAGNOSIS — Z5111 Encounter for antineoplastic chemotherapy: Secondary | ICD-10-CM

## 2022-06-17 DIAGNOSIS — R791 Abnormal coagulation profile: Secondary | ICD-10-CM

## 2022-06-17 DIAGNOSIS — C9200 Acute myeloblastic leukemia, not having achieved remission: Secondary | ICD-10-CM

## 2022-06-17 DIAGNOSIS — Z79899 Other long term (current) drug therapy: Secondary | ICD-10-CM

## 2022-06-17 DIAGNOSIS — Z79624 Long term (current) use of inhibitors of nucleotide synthesis: Secondary | ICD-10-CM

## 2022-06-17 DIAGNOSIS — Z79631 Long term (current) use of antimetabolite agent: Secondary | ICD-10-CM

## 2022-06-17 DIAGNOSIS — C931 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: Secondary | ICD-10-CM

## 2022-06-17 DIAGNOSIS — Z7982 Long term (current) use of aspirin: Secondary | ICD-10-CM

## 2022-06-17 LAB — CBC WITH AUTO DIFFERENTIAL
BKR ALKALINE PHOSPHATASE: 0 % (ref 0.0–1.0)
BKR WAM ABSOLUTE IMMATURE GRANULOCYTES.: 0 x 1000/??L (ref 0.00–0.30)
BKR WAM ABSOLUTE LYMPHOCYTE COUNT.: 0.46 x 1000/ÂµL — ABNORMAL LOW (ref 0.60–3.70)
BKR WAM ABSOLUTE NRBC (2 DEC): 0 x 1000/??L (ref 0.00–1.00)
BKR WAM ANALYZER ANC: 0.76 x 1000/ÂµL — ABNORMAL LOW (ref 2.00–7.60)
BKR WAM BASOPHIL ABSOLUTE COUNT.: 0 x 1000/??L (ref 0.00–1.00)
BKR WAM BASOPHILS: 0 % (ref 0.0–1.4)
BKR WAM EOSINOPHIL ABSOLUTE COUNT.: 0.01 x 1000/??L (ref 0.00–1.00)
BKR WAM EOSINOPHILS: 0.7 % (ref 0.0–5.0)
BKR WAM HEMATOCRIT (2 DEC): 26.3 % — ABNORMAL LOW (ref 38.50–50.00)
BKR WAM HEMOGLOBIN: 8.3 g/dL — ABNORMAL LOW (ref 13.2–17.1)
BKR WAM IMMATURE GRANULOCYTES: 0 % (ref 0.0–1.0)
BKR WAM LYMPHOCYTES: 34.3 % (ref 17.0–50.0)
BKR WAM MCH (PG): 28.9 pg (ref 27.0–33.0)
BKR WAM MCHC: 31.6 g/dL (ref 31.0–36.0)
BKR WAM MCV: 91.6 fL — ABNORMAL HIGH (ref 80.0–100.0)
BKR WAM MONOCYTE ABSOLUTE COUNT.: 0.11 x 1000/??L (ref 0.00–1.00)
BKR WAM MONOCYTES: 8.2 % (ref 4.0–12.0)
BKR WAM MPV: 10.5 fL (ref 8.0–12.0)
BKR WAM NEUTROPHILS: 56.8 % — ABNORMAL LOW (ref 39.0–72.0)
BKR WAM NUCLEATED RED BLOOD CELLS: 0 % (ref 0.0–1.0)
BKR WAM PLATELETS: 721 x1000/ÂµL — ABNORMAL HIGH (ref 150–420)
BKR WAM RDW-CV: 18.2 % — ABNORMAL HIGH (ref 11.0–15.0)
BKR WAM RED BLOOD CELL COUNT.: 2.87 M/??L — ABNORMAL LOW (ref 4.00–6.00)
BKR WAM WHITE BLOOD CELL COUNT: 1.3 x1000/??L — ABNORMAL LOW (ref 4.0–11.0)

## 2022-06-17 LAB — COMPREHENSIVE METABOLIC PANEL
BKR A/G RATIO: 1.5 (ref 1.0–2.2)
BKR ALANINE AMINOTRANSFERASE (ALT): 14 U/L (ref 9–59)
BKR ALBUMIN: 3.8 g/dL (ref 3.6–5.1)
BKR ANION GAP: 9 g/dL (ref 7–17)
BKR ASPARTATE AMINOTRANSFERASE (AST): 27 U/L — ABNORMAL LOW (ref 10–35)
BKR AST/ALT RATIO: 1.9
BKR BILIRUBIN TOTAL: 0.8 mg/dL (ref 2.7–<=1.2)
BKR BLOOD UREA NITROGEN: 14 mg/dL — ABNORMAL HIGH (ref 8–23)
BKR BUN / CREAT RATIO: 14.9 % (ref 8.0–23.0)
BKR CALCIUM: 8.7 mg/dL — ABNORMAL LOW (ref 8.8–10.2)
BKR CHLORIDE: 109 mmol/L — ABNORMAL HIGH (ref 98–107)
BKR CO2: 22 mmol/L (ref 20–30)
BKR CREATININE: 0.94 mg/dL (ref 0.40–1.30)
BKR EGFR, CREATININE (CKD-EPI 2021): >60 mL/min/{1.73_m2} (ref >=60)
BKR GLOBULIN: 2.5 g/dL — ABNORMAL LOW (ref 2.0–3.9)
BKR GLUCOSE: 93 mg/dL (ref 70–100)
BKR POTASSIUM: 4.2 mmol/L — ABNORMAL LOW (ref 3.3–5.3)
BKR PROTEIN TOTAL: 6.3 g/dL (ref 5.9–8.3)
BKR SODIUM: 140 mmol/L — ABNORMAL LOW (ref 136–144)

## 2022-06-17 LAB — PHOSPHORUS     (BH GH L LMW YH): BKR PHOSPHORUS: 3 mg/dL (ref 2.2–4.5)

## 2022-06-17 LAB — CYTOMEGALOVIRUS ANTIBODY, IGG
BKR CMV IGG INITIAL RESULT: <0.2 U/mL
BKR CYTOMEGALOVIRUS IGG ANTIBODY: NEGATIVE

## 2022-06-17 LAB — FIBRINOGEN     (BH GH L LMW YH)
BKR FIBRINOGEN LEVEL: 367 mg/dL (ref 194–448)
BKR WAM ABSOLUTE LYMPHOCYTE COUNT.: 367 mg/dL (ref 194–448)

## 2022-06-17 LAB — PT/INR AND PTT (BH GH L LMW YH)
BKR INR: 1.09 (ref 0.86–1.12)
BKR PARTIAL THROMBOPLASTIN TIME: 25.6 seconds (ref 23.0–31.4)
BKR PROTHROMBIN TIME: 12 seconds (ref 9.6–12.3)

## 2022-06-17 LAB — URIC ACID: BKR URIC ACID: 4 mg/dL (ref 2.7–7.3)

## 2022-06-17 MED ORDER — DECITABINE INFUSION
Freq: Once | INTRAVENOUS | Status: CP
Start: 2022-06-17 — End: ?
  Administered 2022-06-18: 13:00:00 100.000 mL/h via INTRAVENOUS

## 2022-06-17 MED ORDER — ONDANSETRON 8 MG DISINTEGRATING TABLET
8 mg | Freq: Once | Status: CP
Start: 2022-06-17 — End: ?
  Administered 2022-06-17: 17:00:00 8 mg

## 2022-06-17 MED ORDER — ONDANSETRON 8 MG DISINTEGRATING TABLET
8 mg | Freq: Once | Status: CP
Start: 2022-06-17 — End: ?
  Administered 2022-06-18: 13:00:00 8 mg

## 2022-06-17 NOTE — Progress Notes
HEMATOLOGIC MALIGNANCIES CLINICPROGRESS NOTEDiagnosis: CMML/MDS subtypeCurrent treatment: Aza/Ven C1D1 4/30/24History of Present Illness: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 He presented in 07/2021 to establish hematologic malignancies care given that his daughter lives in [place]. He resides in [place] and is under the care of Dr. Sofie Rower of [facility] in [place]. He established care with Dr. Mel Almond 05/14/21. He has been under observation for the CMML. Around February, his Hb was found to be 5 for which he received 2U of PRBC. He had had no viral syndrome prior. 3-4 weeks prior he had sustained a bad fall in which his LLE became significantly swollen. He was to have a [place] LLE which did not occur. There is a question of whether he could have bled into his leg. At the time he became progressively fatigued and SOB. He had a cardiac evaluation including a pharmacologic stress test which was reported to him as normal. He did not have endoscopies; last colonoscopy was in 2021. He was found to be iron deficient and his Hb responded to IV iron rising to 12.2 in 12/2020. A bone marrow biopsy and aspiration was repeat 05/16/21 and reported to be stable. 07/08/21 he saw Dr. Mendel Corning at [facility] who recommended deferring allogeneic SCT until the time of progression and HMA until the time of critical cytopenias.At baseline he is chronically tired, sleeps very poorly; has been recommended to have sleep test.He is not limited in his activities; walks once a day for 20 minutes.In [place] he was bicycling and walking up to 3-4X/day.Dr. 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 He was offered admission at [facility] and decided to come to [employer] to be treated near his family.Review of CBC in care everywhere shows that WBC count trended down to 1.29, ANC to 0.31 with Hb 12.4 on the same date and platelets 121, 01/16/22.The following CBC on 04/07/22 showed WBC 1.4, ANC 0.75, Hb 5.9, MCV 101, Platelets 383 for which he was transfused 2U of PRBC on 04/09/22 and 1U of PRBC on 04/24/22.The anemia w/u on 04/07/22 showed ferritin 15, epo level 632, TSH 4.2, B12 1342, folate >20, retic 6.29%, LDH 257(ULN 246).The development of severe bicytopenia triggered the bone marrow biopsy and aspiration 04/28/22.He was subsequently started on venofer and had 5-6 infusions of 200 mg.Interval History:Douglas [name] presents for f/u in the clinic with his wife. He was last seen by Dr. Sherrell Puller on 05/29/22. He had Hickman catheter placed on 06/10/22. Reports area is sore. [place] guided bone marrow biopsy done earlier today. He and wife heard from MAP program today and are receiving a grant for $10K to support Venetoclax. They have not heard back yet regarding vori-copay assist. He is feeling well today and offers no complaints aside from discomfort at Hickman site.Reviewed medication list, allergies, PMH, PSH, SH, FHPMH:?CLLHypertensionHyperlipidemiaGlaucomaBasal cell carcinoma, foreheadProstate hypertrophyDepressionAnxietyReview of Systems --negative aside from those listed aboveConstitutional: Negative.  HENT: Negative.  Eyes: Negative.  Respiratory: Negative.  Cardiovascular: Negative.  Gastrointestinal: Negative.  Denies dark stool, denies bloody stoolGenitourinary: Negative.  Musculoskeletal: Negative.  Skin: Negative.  Aside from aboveNeurological: Negative.  Endo/Heme/Allergies: negativePsychiatric/Behavioral: Negative.  Physical ExamECOG PS: 0Vitals reviewed. Constitutional:    No acute distress, Not ill-appearing. HENT:   Alopecia;  Nose normal. Mucous membranes are moist and without lesions. Oropharynx is clear. No oropharyngeal exudate or posterior oropharyngeal erythema. Eyes:    No scleral icterus. Conjunctivae normal. Cardiovascular:    Normal rate and regular rhythm. S1S2, no murmurs, rubs or gallops. Normal pulses. Pulmonary:    Pulmonary effort is normal. No respiratory distress. Normal breath sounds. No wheezing, rhonchi or rales. Abdominal:   Abdomen is flat. Bowel sounds are normal. There is no distension. Abdomen is soft. There is no abdominal tenderness. There is no guarding. No HSM Musculoskeletal:    Normal range of motion; no joint swelling; No edema to extremitiesSkin:   Skin is warm and dry. Skin is pale. No rash.    Comments: RCW Hickman catheter site without erythema, edema,  drainage. Tender. Dressing clean, dry, intactNeurological:    No focal deficit present. Gait is steady. Alert and oriented to person, place, and time. Mental status is at baseline. Psychiatric:       Mood normal. Labs dated 06/15/22 reviewedAssessment and Plan:Douglas [name] is a 72 year old man with a history of depression, anxiety, iron deficiency anemia, CLL, CMML (diagnosed 12/2019) who developed mild pancytopenia in 01/2022 followed by severe anemia with Hb 5 iso of iron deficiency and persistent leukopenia/neutropenia 04/07/22 now with marrow done in [facility] under the care of Dr. Marnette Burgess raising concern for disease evolving to AML.# Secondary AML from CMMLDiagnostics-Repeat bone marrow biopsy done today 06/15/22 with [place] guidance with sedation (given that slides are not available and one month has elapsed)	Send NPM1 PCR, AML/MDS FISH, CCS heme panel in addition to [facility].- FLT3-TKD/ITD PCR from [facility] negative for ITD and D835 mutations- Given he now has peripheral blasts, NPM1 PCR and MDS/AML rapid panel ordered from [facility] on 05/30/22--NPM1 PCR negative; rapid panel revealed: One variant of known pathogenic significance [in RUNX1] is detected by this rapid NGS assessment. Treatment- Consented for Decitabine/venetoclax which is currently the plan for induction in the absence of an NPM1 mutation, MLL-rearrangement or FLT3 mutationHe will obtain Venetoclax supply this evening. We will plan to start C1D1 of Dec/Ven tomorrow 06/16/22. I reviewed the plan in detail including potential and expected side effects. I discussed ramp up of venetoclax and I asked our pharmacist to meet with them again tomorrow to review with pills in hand.We discussed need for bone marrow biopsy between days 21 and 28-order for [place] guided biopsy ordered and scheduling requested. We discussed if blasts still in peripheral blood we may delay biopsy. Transplant planning: has initial visit with [name] on 06/24/22. HLA typing sent on 05/30/22. CMV IgG not sent, ordered.Monitoring- Biweekly labs with CBC w/ diff, CMP, TLS and DIC labs for now.- TLS ppx: start allopurinol 300 mg PO dailySupportive care- TLS ppx: allopurinol 300 mg PO daily, confirmed he is taking- Hb goal >=8, Plts >=20, irradiated blood products- Hickman flush 2x/week, dressing change weekly# Anti-microbial ProphylaxisHSV/VZV: Acylovir 400 mg PO bid (prior dose adjusted; eprescribed)Bacterial: Fluoroquinolone for ANC<500 --> Will decrease ppx to 500 mg daily confirmed he is takingFungal: broad azole for ANC<500 --> awaiting copay assist for voriconazole, message sent to OPS# CLL- 04/28/22 BM aspirate flow cytometry did not detect a monoclonal B cell population# Iron deficiency anemia- s/p IV iron ~1g March 2024- Send [place], repeat reticulocyte count, iron studies. DAT on 4/12 was negative; absolute retic count wnl, retic percentage elevated, immature retic fraction elevated; iron 53, Iron sat wnl, TIBC wnl; - urgent EGD/colonoscopy referral placed by Dr. Sherrell Puller as anti-neoplastic therapy and disease are expected to result  in increased bleeding risk due to anticipated thrombocytopenia; Scheduled for 5/9.Psychosocial# AnxietyManaged with non-pharmacologic methods.Plan is to stay here in [place] for treatment until disease is in remission-if transplant needed, would do it here. Reviewed plan. Reviewed reportable s/s and encouraged him to call with any new/worsening symptoms, questions or concerns. They expressed good understanding of information provided.More than 30 minutes was spent by me on this encounter; including face to face time with patient, review of chart, and care coordination.This does not include any time spent performing a procedural service.Electronically Signed by Rozanna Box, APRN, Jun 17, 2022

## 2022-06-17 NOTE — Progress Notes
Douglas [name] arrives to infusion ambulatory with his wife for C1D2 decitabine. Reports he is feeling well today. Denies pain or any s/s of bleeding. Reports some itchiness around dressing borders and some pinkness noted around hickman insertion site but no signs of active infection. No increased redness, warmth, or drainage. APRN Misty Stanley aware. Dressing changed- biopatch applied and IV 3000 dressing used. Pt reported itchiness resolved with new dressing. Hickman catheter flushed- both lumens flush easily with good blood return. Dressing/claves/caps changed per protocol. Labs obtained. Premedicated with 8mg  PO zofran. Decitabine infused over 1 hour, administered by L. Francena Hanly, RN. Tolerated well. Hickman flushed and capped for discharge. RTC tomorrow 5/2 for C1D3. Discharged in stable condition.

## 2022-06-18 ENCOUNTER — Encounter: Admit: 2022-06-18 | Payer: PRIVATE HEALTH INSURANCE | Primary: Internal Medicine

## 2022-06-18 ENCOUNTER — Telehealth: Admit: 2022-06-18 | Payer: PRIVATE HEALTH INSURANCE | Primary: Internal Medicine

## 2022-06-18 ENCOUNTER — Encounter: Admit: 2022-06-18 | Payer: PRIVATE HEALTH INSURANCE | Attending: Hematology | Primary: Internal Medicine

## 2022-06-18 ENCOUNTER — Inpatient Hospital Stay: Admit: 2022-06-18 | Discharge: 2022-06-18 | Payer: MEDICARE

## 2022-06-18 DIAGNOSIS — C9200 Acute myeloblastic leukemia, not having achieved remission: Secondary | ICD-10-CM

## 2022-06-18 DIAGNOSIS — C931 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: Secondary | ICD-10-CM

## 2022-06-18 DIAGNOSIS — Z79899 Other long term (current) drug therapy: Secondary | ICD-10-CM

## 2022-06-18 DIAGNOSIS — Z79624 Long term (current) use of inhibitors of nucleotide synthesis: Secondary | ICD-10-CM

## 2022-06-18 DIAGNOSIS — Z5111 Encounter for antineoplastic chemotherapy: Secondary | ICD-10-CM

## 2022-06-18 DIAGNOSIS — Z79631 Long term (current) use of antimetabolite agent: Secondary | ICD-10-CM

## 2022-06-18 LAB — COMPREHENSIVE METABOLIC PANEL
BKR A/G RATIO: 1.6 (ref 1.0–2.2)
BKR ALANINE AMINOTRANSFERASE (ALT): 16 U/L (ref 9–59)
BKR ALBUMIN: 3.9 g/dL (ref 3.6–5.1)
BKR ALKALINE PHOSPHATASE: 76 U/L (ref 9–122)
BKR ANION GAP: 8 (ref 7–17)
BKR ASPARTATE AMINOTRANSFERASE (AST): 29 U/L (ref 10–35)
BKR AST/ALT RATIO: 1.8
BKR BILIRUBIN TOTAL: 0.7 mg/dL (ref <=1.2)
BKR BLOOD UREA NITROGEN: 11 mg/dL (ref 8–23)
BKR BUN / CREAT RATIO: 11.5 (ref 8.0–23.0)
BKR CALCIUM: 8.8 mg/dL (ref 8.8–10.2)
BKR CHLORIDE: 107 mmol/L (ref 98–107)
BKR CO2: 24 mmol/L (ref 20–30)
BKR CREATININE: 0.96 mg/dL (ref 0.40–1.30)
BKR EGFR, CREATININE (CKD-EPI 2021): >60 mL/min/{1.73_m2} (ref >=60)
BKR GLOBULIN: 2.4 g/dL (ref 2.0–3.9)
BKR GLUCOSE: 102 mg/dL — ABNORMAL HIGH (ref 70–100)
BKR POTASSIUM: 4.2 mmol/L (ref 3.3–5.3)
BKR PROTEIN TOTAL: 6.3 g/dL (ref 5.9–8.3)
BKR SODIUM: 139 mmol/L (ref 136–144)

## 2022-06-18 LAB — PHOSPHORUS     (BH GH L LMW YH): BKR PHOSPHORUS: 2.7 mg/dL (ref 2.2–4.5)

## 2022-06-18 LAB — URIC ACID: BKR URIC ACID: 4.2 mg/dL (ref 2.7–7.3)

## 2022-06-18 MED ORDER — ONDANSETRON 8 MG DISINTEGRATING TABLET
8 mg | Freq: Once | Status: DC
Start: 2022-06-18 — End: 2022-06-18

## 2022-06-18 MED ORDER — DECITABINE INFUSION
Freq: Once | INTRAVENOUS | Status: DC
Start: 2022-06-18 — End: 2022-06-18

## 2022-06-18 NOTE — Telephone Encounter
French Ana, from [facility], called on behalf of Dr. Vernelle Emerald regarding patient. Pt is scheduled for EGD on 06/25/22. Provider would like Colonoscopy scheduled and performed same day. STAT referral is being placed by dept to add onto existing scheduled procedure appt. Procedural schedulers were called but no avail. Verified Tracy's best call back number [phone]

## 2022-06-18 NOTE — Telephone Encounter
Called back [place] from [facility], relayed info that colon will be added on to egd, but date has changed for 5/15. Confirmed

## 2022-06-18 NOTE — Telephone Encounter
Confirmed w/ patient and his wife that date change of colon/egd will be 07/01/22, same location [facility]. Sending prep instructions via mychart and email.

## 2022-06-19 ENCOUNTER — Inpatient Hospital Stay: Admit: 2022-06-19 | Discharge: 2022-06-19 | Payer: MEDICARE

## 2022-06-19 ENCOUNTER — Encounter: Admit: 2022-06-19 | Payer: PRIVATE HEALTH INSURANCE | Attending: Internal Medicine | Primary: Internal Medicine

## 2022-06-19 ENCOUNTER — Encounter: Admit: 2022-06-19 | Payer: PRIVATE HEALTH INSURANCE | Attending: Family | Primary: Internal Medicine

## 2022-06-19 DIAGNOSIS — Z79631 Long term (current) use of antimetabolite agent: Secondary | ICD-10-CM

## 2022-06-19 DIAGNOSIS — Z79624 Long term (current) use of inhibitors of nucleotide synthesis: Secondary | ICD-10-CM

## 2022-06-19 DIAGNOSIS — Z79899 Other long term (current) drug therapy: Secondary | ICD-10-CM

## 2022-06-19 DIAGNOSIS — Z5111 Encounter for antineoplastic chemotherapy: Secondary | ICD-10-CM

## 2022-06-19 DIAGNOSIS — C9200 Acute myeloblastic leukemia, not having achieved remission: Secondary | ICD-10-CM

## 2022-06-19 MED ORDER — ONDANSETRON 8 MG DISINTEGRATING TABLET
8 mg | Freq: Once | Status: CP
Start: 2022-06-19 — End: ?
  Administered 2022-06-19: 13:00:00 8 mg

## 2022-06-19 MED ORDER — DECITABINE INFUSION
Freq: Once | INTRAVENOUS | Status: CP
Start: 2022-06-19 — End: ?
  Administered 2022-06-19: 14:00:00 100.000 mL/h via INTRAVENOUS

## 2022-06-19 MED ORDER — PEG 3350-ELECTROLYTES 236 GRAM-22.74 GRAM-6.74 GRAM-5.86 GRAM SOLUTION
236-22.74-6.74-5.86 -5.86 gram | Freq: Once | ORAL | 1 refills | Status: AC
Start: 2022-06-19 — End: ?

## 2022-06-19 NOTE — Progress Notes
Douglas [name] arrives to infusion for C1D4 decitabine. Reports he is feeling well today and offers no complaints. Denies pain and any s/s of infection.Hickman site remains slightly pink but itching has mostly resolved. Both lumens flushed and dressing c/d/I. No labs today. Premedicated with po zofran before decitabine infusion. Decitabine infused over 1 hour by L. Francena Hanly, RN. Tolerated well. Hickman flushed and capped for discharge. RTC tomorrow for C1D5 Decitabine and labs poss.

## 2022-06-19 NOTE — Telephone Encounter
Pt called and stated Dr. Vernelle Emerald wants the pt's upcoming procedure pushed out to the first week of June.Best call back is [phone]

## 2022-06-19 NOTE — Progress Notes
Douglas [name] arrives to infusion for C1d3 decitabine and TLS labs.  States conts to feel well with no complaints.  States hickman site feels better today-itching has resolved.  Site still remains slightly pink above insertion.  Dressing and biopatch remain D&I.Hickman intact-both lumens flush easily with good blood return noted.  TLS  Labs drawn.  Premedicated with zofran and IV decitabine infusing  as ordered.  Toleratedx infusion with no s/s adverse reaction.  TLS labs WNL.Hickman flushed and capped for discharge.  D/C home.

## 2022-06-20 ENCOUNTER — Ambulatory Visit: Admit: 2022-06-20 | Payer: MEDICARE | Primary: Internal Medicine

## 2022-06-20 ENCOUNTER — Inpatient Hospital Stay: Admit: 2022-06-20 | Payer: MEDICARE

## 2022-06-20 ENCOUNTER — Inpatient Hospital Stay: Admit: 2022-06-20 | Discharge: 2022-06-20 | Payer: MEDICARE

## 2022-06-20 DIAGNOSIS — Z79631 Long term (current) use of antimetabolite agent: Secondary | ICD-10-CM

## 2022-06-20 DIAGNOSIS — Z79624 Long term (current) use of inhibitors of nucleotide synthesis: Secondary | ICD-10-CM

## 2022-06-20 DIAGNOSIS — Z5111 Encounter for antineoplastic chemotherapy: Secondary | ICD-10-CM

## 2022-06-20 DIAGNOSIS — C9200 Acute myeloblastic leukemia, not having achieved remission: Secondary | ICD-10-CM

## 2022-06-20 DIAGNOSIS — Z79899 Other long term (current) drug therapy: Secondary | ICD-10-CM

## 2022-06-20 DIAGNOSIS — C931 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: Secondary | ICD-10-CM

## 2022-06-20 LAB — CBC WITH AUTO DIFFERENTIAL
BKR POTASSIUM: 26.6 % — ABNORMAL LOW (ref 38.50–50.00)
BKR WAM ABSOLUTE NRBC (2 DEC): 0 x 1000/ÂµL (ref 0.00–1.00)
BKR WAM HEMATOCRIT (2 DEC): 26.6 % — ABNORMAL LOW (ref 38.50–50.00)
BKR WAM HEMOGLOBIN: 8.2 g/dL — ABNORMAL LOW (ref 13.2–17.1)
BKR WAM LYMPHOCYTE - ABS (DIFF) 2 DEC: 0 x 1000/??L — ABNORMAL LOW (ref 0.00–1.00)
BKR WAM LYMPHOCYTES (DIFF): 28.5 pg (ref 27.0–33.0)
BKR WAM MCH (PG): 28.5 pg (ref 27.0–33.0)
BKR WAM MCHC: 30.8 g/dL — ABNORMAL LOW (ref 31.0–36.0)
BKR WAM MCV: 92.4 fL — ABNORMAL HIGH (ref 80.0–100.0)
BKR WAM MPV: 10.8 fL (ref 8.0–12.0)
BKR WAM NEUTROPHILS - ABS (DIFF) 2 DEC: 0 % — ABNORMAL LOW (ref 0.0–1.0)
BKR WAM NUCLEATED RED BLOOD CELLS: 0 % (ref 0.0–1.0)
BKR WAM PLATELETS: 546 x1000/ÂµL — ABNORMAL HIGH (ref 150–420)
BKR WAM RDW-CV: 17.8 % — ABNORMAL HIGH (ref 11.0–15.0)
BKR WAM RED BLOOD CELL COUNT.: 2.88 M/??L — ABNORMAL LOW (ref 4.00–6.00)
BKR WAM WHITE BLOOD CELL COUNT: 0.9 x1000/??L — ABNORMAL LOW (ref 4.0–11.0)

## 2022-06-20 LAB — COMPREHENSIVE METABOLIC PANEL
BKR A/G RATIO: 1.6 (ref 1.0–2.2)
BKR ALANINE AMINOTRANSFERASE (ALT): 20 U/L — AB (ref 9–59)
BKR ALBUMIN: 4 g/dL (ref 3.6–5.1)
BKR ALKALINE PHOSPHATASE: 78 U/L (ref 9–122)
BKR ANION GAP: 9 (ref 7–17)
BKR ASPARTATE AMINOTRANSFERASE (AST): 29 U/L (ref 10–35)
BKR AST/ALT RATIO: 1.5
BKR BILIRUBIN TOTAL: 0.7 mg/dL (ref <=1.2)
BKR BLOOD UREA NITROGEN: 15 mg/dL (ref 8–23)
BKR BUN / CREAT RATIO: 15.6 (ref 8.0–23.0)
BKR CALCIUM: 8.8 mg/dL (ref 8.8–10.2)
BKR CHLORIDE: 108 mmol/L — ABNORMAL HIGH (ref 98–107)
BKR CO2: 22 mmol/L (ref 20–30)
BKR CREATININE: 0.96 mg/dL (ref 0.40–1.30)
BKR EGFR, CREATININE (CKD-EPI 2021): >60 mL/min/{1.73_m2} (ref >=60)
BKR GLOBULIN: 2.5 g/dL (ref 2.0–3.9)
BKR GLUCOSE: 84 mg/dL (ref 70–100)
BKR PROTEIN TOTAL: 6.5 g/dL (ref 5.9–8.3)
BKR SODIUM: 139 mmol/L — ABNORMAL LOW (ref 136–144)
BKR WAM ATYPICAL LYMPHOCYTES (DIFF) 1 DEC: 0.96 mg/dL (ref 0.40–1.30)

## 2022-06-20 LAB — MANUAL DIFFERENTIAL
BKR WAM BASOPHIL - ABS (DIFF) 2 DEC: 0 x 1000/??L (ref 0.00–1.00)
BKR WAM BASOPHILS (DIFF): 0 % — ABNORMAL HIGH (ref 0.0–1.4)
BKR WAM EOSINOPHILS (DIFF) 2 DEC: 0.01 x 1000/ÂµL (ref 0.00–1.00)
BKR WAM EOSINOPHILS (DIFF): 0.9 % (ref 0.0–5.0)
BKR WAM MONOCYTE - ABS (DIFF) 2 DEC: 0.02 x 1000/??L (ref 0.00–1.00)
BKR WAM MONOCYTES (DIFF): 2.6 % — ABNORMAL LOW (ref 4.0–12.0)
BKR WAM NEUTROPHILS (DIFF): 56.1 % (ref 39.0–72.0)

## 2022-06-20 LAB — URIC ACID: BKR URIC ACID: 4 mg/dL (ref 2.7–7.3)

## 2022-06-20 LAB — PHOSPHORUS     (BH GH L LMW YH): BKR PHOSPHORUS: 3.4 mg/dL (ref 2.2–4.5)

## 2022-06-20 LAB — LACTATE DEHYDROGENASE: BKR LACTATE DEHYDROGENASE: 312 U/L — ABNORMAL HIGH (ref 122–241)

## 2022-06-20 MED ORDER — ONDANSETRON 8 MG DISINTEGRATING TABLET
8 mg | Freq: Once | Status: CP
Start: 2022-06-20 — End: ?
  Administered 2022-06-20: 16:00:00 8 mg

## 2022-06-20 MED ORDER — DECITABINE INFUSION
Freq: Once | INTRAVENOUS | Status: CP
Start: 2022-06-20 — End: ?
  Administered 2022-06-20: 16:00:00 100.000 mL/h via INTRAVENOUS

## 2022-06-20 NOTE — Progress Notes
Douglas [name] arrives to infusion for C1D5 decitabine. Reports he is feeling well today and offers no complaints.  Plan to draw labs to monitor for [facility] intact-both lumens flush easily with good blood return noted.  Labs drawn.  Hgb 8.2, platelets 546 and ANC 0.50.  discussed lower ANC and prophylactic meds he is taking with M. McGlennon APRN.  No med changes needed at this time.  Message sent to L. Barbarotta APRN to review on Monday.Premedicated with po zofran before decitabine infusion. Decitabine infused over 1 hour. Tolerated well. Hickman flushed and capped for discharge. RTC on wed for labs possible and appt with Dr Lenard Forth.

## 2022-06-22 ENCOUNTER — Telehealth: Admit: 2022-06-22 | Payer: PRIVATE HEALTH INSURANCE

## 2022-06-22 NOTE — Telephone Encounter
06/22/2022  11:00 AM Post Treatment Call Post Treatment call made? Yes Temperature >100.4 degrees F? No   Shaking chills No   For patients with central venous access device: Any redness, pain, swelling at line site? No Pain 0 Lack of appetite No Nausea No Constipation No Diarrhea No Problems with Urinating  No Shortness of Breath No Rash No Difficulty performing activities of daily living No Tiredness No Insomnia No Depression No Anxious No Wellbeing concerns No Financial Distress No Spiritual Pain No Was pegfilgrastim included in the treatment plan?  No Were you able to fill your prescriptions? Yes Medications reviewed with patient using teachback? No Patient/Family/Other able to explain how to take medications and verbalize understanding of information provided? Yes Patient education provided? No All questions answered? Yes Confirmed patient has correct phone number to call? Yes Patient/Family/Other able to state date and time of next appointment? Yes Next Appointment Date: 06/24/2022 Next Appointment Time: 08:00

## 2022-06-23 ENCOUNTER — Telehealth: Admit: 2022-06-23 | Payer: PRIVATE HEALTH INSURANCE | Attending: Internal Medicine

## 2022-06-23 NOTE — Telephone Encounter
Hello Tracy:I have reached out to the following patient to reschedule his colon/pan for June however patient stated he did not know he needed to push out the procedure and asked for [place] not to cancel the apt until he speaks to Dr Sherrell Puller.Thanks

## 2022-06-23 NOTE — Telephone Encounter
Pt is currently scheduled for a colon/pan on 5/15. Pts referring provider, Dr. Sherrell Puller would like to push out the pts procedures - please see provider notes bellow.He is likely to be neutropenic with ANC<500 on 5/15 and I have had GI be reluctant to scope during severe neutropenia (understandable). First week in June if they can possibly accommodate would be better.French Ana from Dr. Dema Severin office can be reached at [phone] if you have any questions.

## 2022-06-24 ENCOUNTER — Inpatient Hospital Stay: Admit: 2022-06-24 | Discharge: 2022-06-24 | Payer: MEDICARE

## 2022-06-24 ENCOUNTER — Encounter: Admit: 2022-06-24 | Payer: PRIVATE HEALTH INSURANCE | Attending: Medical Oncology

## 2022-06-24 ENCOUNTER — Ambulatory Visit: Admit: 2022-06-24 | Payer: MEDICARE | Attending: Medical Oncology

## 2022-06-24 DIAGNOSIS — Z01818 Encounter for other preprocedural examination: Secondary | ICD-10-CM

## 2022-06-24 DIAGNOSIS — C4491 Basal cell carcinoma of skin, unspecified: Secondary | ICD-10-CM

## 2022-06-24 DIAGNOSIS — C931 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: Secondary | ICD-10-CM

## 2022-06-24 DIAGNOSIS — C9200 Acute myeloblastic leukemia, not having achieved remission: Secondary | ICD-10-CM

## 2022-06-24 DIAGNOSIS — F32A Depression: Secondary | ICD-10-CM

## 2022-06-24 DIAGNOSIS — C9110 Chronic lymphocytic leukemia of B-cell type not having achieved remission: Secondary | ICD-10-CM

## 2022-06-24 DIAGNOSIS — F419 Anxiety disorder, unspecified: Secondary | ICD-10-CM

## 2022-06-24 DIAGNOSIS — E782 Mixed hyperlipidemia: Secondary | ICD-10-CM

## 2022-06-24 DIAGNOSIS — Z9484 Stem cells transplant status: Secondary | ICD-10-CM

## 2022-06-24 DIAGNOSIS — R0609 Other forms of dyspnea: Secondary | ICD-10-CM

## 2022-06-24 DIAGNOSIS — N400 Enlarged prostate without lower urinary tract symptoms: Secondary | ICD-10-CM

## 2022-06-24 DIAGNOSIS — I10 Essential (primary) hypertension: Secondary | ICD-10-CM

## 2022-06-24 DIAGNOSIS — I119 Hypertensive heart disease without heart failure: Secondary | ICD-10-CM

## 2022-06-24 DIAGNOSIS — H409 Unspecified glaucoma: Secondary | ICD-10-CM

## 2022-06-24 DIAGNOSIS — Z8249 Family history of ischemic heart disease and other diseases of the circulatory system: Secondary | ICD-10-CM

## 2022-06-24 LAB — COMPREHENSIVE METABOLIC PANEL
BKR A/G RATIO: 1.6 (ref 1.0–2.2)
BKR ALBUMIN: 4.1 g/dL (ref 3.6–5.1)
BKR ALKALINE PHOSPHATASE: 86 U/L (ref 9–122)
BKR ANION GAP: 11 (ref 7–17)
BKR AST/ALT RATIO: 1 % (ref 0.0–1.0)
BKR BILIRUBIN TOTAL: 0.9 mg/dL (ref 39.0–<=1.2)
BKR BLOOD UREA NITROGEN: 15 mg/dL (ref 8–23)
BKR BUN / CREAT RATIO: 16.1 % — ABNORMAL HIGH (ref 8.0–23.0)
BKR CALCIUM: 8.9 mg/dL (ref 8.8–10.2)
BKR CHLORIDE: 108 mmol/L — ABNORMAL HIGH (ref 98–107)
BKR CO2: 21 mmol/L (ref 20–30)
BKR CREATININE: 0.93 mg/dL (ref 0.40–1.30)
BKR EGFR, CREATININE (CKD-EPI 2021): >60 mL/min/{1.73_m2} (ref >=60)
BKR GLOBULIN: 2.6 g/dL (ref 2.0–3.9)
BKR GLUCOSE: 110 mg/dL — ABNORMAL HIGH (ref 70–100)
BKR POTASSIUM: 3.9 mmol/L (ref 3.3–5.3)
BKR PROTEIN TOTAL: 6.7 g/dL (ref 5.9–8.3)
BKR SODIUM: 140 mmol/L (ref 136–144)
BKR WAM RED BLOOD CELL COUNT.: 21 mmol/L — ABNORMAL LOW (ref 20–30)

## 2022-06-24 LAB — CBC WITH AUTO DIFFERENTIAL
BKR ALANINE AMINOTRANSFERASE (ALT): 2.6 % — ABNORMAL LOW (ref 4.0–12.0)
BKR WAM ABSOLUTE IMMATURE GRANULOCYTES.: 0 x 1000/ÂµL (ref 0.00–0.30)
BKR WAM ABSOLUTE LYMPHOCYTE COUNT.: 0.21 x 1000/??L — ABNORMAL LOW (ref 0.60–3.70)
BKR WAM ABSOLUTE NRBC (2 DEC): 0 x 1000/??L — ABNORMAL HIGH (ref 0.00–1.00)
BKR WAM ANALYZER ANC: 0.53 x 1000/ÂµL — ABNORMAL LOW (ref 2.00–7.60)
BKR WAM BASOPHIL ABSOLUTE COUNT.: 0 x 1000/??L (ref 0.00–1.00)
BKR WAM BASOPHILS: 0 % (ref 0.0–1.4)
BKR WAM EOSINOPHILS: 0 % (ref 0.0–5.0)
BKR WAM HEMATOCRIT (2 DEC): 28 % — ABNORMAL LOW (ref 38.50–50.00)
BKR WAM HEMOGLOBIN: 8.7 g/dL — ABNORMAL LOW (ref 13.2–17.1)
BKR WAM IMMATURE GRANULOCYTES: 0 % (ref 0.0–1.0)
BKR WAM MCH (PG): 28.3 pg (ref 27.0–33.0)
BKR WAM MCHC: 31.1 g/dL (ref 31.0–36.0)
BKR WAM MCV: 91.2 fL (ref 80.0–100.0)
BKR WAM MONOCYTE ABSOLUTE COUNT.: 0.02 x 1000/ÂµL (ref 0.00–1.00)
BKR WAM MONOCYTES: 2.6 % — ABNORMAL LOW (ref 4.0–12.0)
BKR WAM MPV: 11 fL (ref 8.0–12.0)
BKR WAM NEUTROPHILS: 69.8 % (ref 39.0–72.0)
BKR WAM NUCLEATED RED BLOOD CELLS: 0 % (ref 0.0–1.0)
BKR WAM PLATELETS: 420 x1000/??L (ref 150–420)
BKR WAM RDW-CV: 17.8 % — ABNORMAL HIGH (ref 11.0–15.0)
BKR WAM WHITE BLOOD CELL COUNT: 0.8 x1000/??L — ABNORMAL LOW (ref 4.0–11.0)

## 2022-06-24 LAB — PHOSPHORUS     (BH GH L LMW YH): BKR PHOSPHORUS: 2.4 mg/dL (ref 2.2–4.5)

## 2022-06-24 LAB — URIC ACID: BKR URIC ACID: 3.7 mg/dL (ref 2.7–7.3)

## 2022-06-24 LAB — LACTATE DEHYDROGENASE: BKR LACTATE DEHYDROGENASE: 285 U/L — ABNORMAL HIGH (ref 122–241)

## 2022-06-24 NOTE — Telephone Encounter
PT is calling in as he has spoken with Dr Sherrell Puller and it was recommended that he reschedule his Procedure to [facility] call [phone]

## 2022-06-24 NOTE — Progress Notes
Patient arrives ambulatory for labs poss and provider visit with [name].C1D8 Decitabine + Venetoclax (400mg  daily).RDL hickman caps and dressing changed using biopatch and sensitive dressing +BR/flush, labs drawn as ordered.Hgb 8.7, plts 420, and ANC 0.53.No transfusions needed.Continues on acyclovir and levaquin for prophylaxis. Per Donald Siva APRN, will hold vori until ANC <500 and then will in turn reduce Venetoclax dose. Patient reports feeling well aside from mild fatigue/DOE. No new sx/complaints.See flow-sheet for full assessment.Declines AVS, d/c in stable condition.RTC Sat 5/11 for labs poss, will continue Wed/Sat labs poss. C2 scheduled this visit.

## 2022-06-25 ENCOUNTER — Encounter: Admit: 2022-06-25 | Payer: MEDICARE

## 2022-06-25 LAB — FISH (CYTOGENETICS) (YMG)

## 2022-06-25 LAB — CHROMOSOME ANALYSIS (CYTOGENETICS) (YMG): BKR BANDING RESOLUTION: 400

## 2022-06-26 ENCOUNTER — Encounter: Admit: 2022-06-26 | Payer: PRIVATE HEALTH INSURANCE | Attending: Hematology & Oncology

## 2022-06-26 DIAGNOSIS — C9200 Acute myeloblastic leukemia, not having achieved remission: Secondary | ICD-10-CM

## 2022-06-27 ENCOUNTER — Inpatient Hospital Stay: Admit: 2022-06-27 | Discharge: 2022-06-27 | Payer: MEDICARE

## 2022-06-27 DIAGNOSIS — C9200 Acute myeloblastic leukemia, not having achieved remission: Secondary | ICD-10-CM

## 2022-06-27 DIAGNOSIS — C931 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: Secondary | ICD-10-CM

## 2022-06-27 DIAGNOSIS — R791 Abnormal coagulation profile: Secondary | ICD-10-CM

## 2022-06-27 LAB — COMPREHENSIVE METABOLIC PANEL
BKR A/G RATIO: 1.6 (ref 1.0–2.2)
BKR ALANINE AMINOTRANSFERASE (ALT): 23 U/L (ref 9–59)
BKR ALBUMIN: 4.2 g/dL (ref 3.6–5.1)
BKR ALKALINE PHOSPHATASE: 85 U/L (ref 9–122)
BKR ANION GAP: 10 (ref 7–17)
BKR ASPARTATE AMINOTRANSFERASE (AST): 28 U/L (ref 10–35)
BKR AST/ALT RATIO: 1.2
BKR BILIRUBIN TOTAL: 0.9 mg/dL (ref <=1.2)
BKR BLOOD UREA NITROGEN: 14 mg/dL (ref 8–23)
BKR BUN / CREAT RATIO: 15.4 (ref 8.0–23.0)
BKR CHLORIDE: 108 mmol/L — ABNORMAL HIGH (ref 98–107)
BKR CO2: 21 mmol/L (ref 20–30)
BKR CREATININE: 0.91 mg/dL (ref 0.40–1.30)
BKR EGFR, CREATININE (CKD-EPI 2021): >60 mL/min/{1.73_m2} (ref >=60)
BKR GLOBULIN: 2.6 g/dL (ref 2.0–3.9)
BKR GLUCOSE: 127 mg/dL — ABNORMAL HIGH (ref 70–100)
BKR PROTEIN TOTAL: 6.8 g/dL (ref 5.9–8.3)
BKR SODIUM: 139 mmol/L (ref 136–144)
BKR WAM EOSINOPHIL ABSOLUTE COUNT.: 1.2
BKR WAM IMMATURE GRANULOCYTES: 85 U/L (ref 9–122)

## 2022-06-27 LAB — CBC WITH AUTO DIFFERENTIAL
BKR CALCIUM: 8.9 mg/dL (ref 8.8–10.2)
BKR POTASSIUM: 26.5 % — ABNORMAL LOW (ref 38.50–50.00)
BKR WAM ABSOLUTE NRBC (2 DEC): 0 x 1000/??L (ref 0.00–1.00)
BKR WAM ANALYZER ANC: 0.44 x 1000/??L — ABNORMAL LOW (ref 2.00–7.60)
BKR WAM BASOPHILS: 0.9 mg/dL (ref <=1.2)
BKR WAM EOSINOPHIL ABSOLUTE COUNT.: 2.92 M/??L — ABNORMAL LOW (ref 4.00–6.00)
BKR WAM HEMATOCRIT (2 DEC): 26.5 % — ABNORMAL LOW (ref 38.50–50.00)
BKR WAM HEMOGLOBIN: 8.2 g/dL — ABNORMAL LOW (ref 13.2–17.1)
BKR WAM LYMPHOCYTES: 15.4 (ref 8.0–23.0)
BKR WAM MCH (PG): 28.1 pg (ref 27.0–33.0)
BKR WAM MCHC: 30.9 g/dL — ABNORMAL LOW (ref 31.0–36.0)
BKR WAM MCV: 90.8 fL (ref 80.0–100.0)
BKR WAM MPV: 11.5 fL (ref 8.0–12.0)
BKR WAM NUCLEATED RED BLOOD CELLS: 0 % (ref 0.0–1.0)
BKR WAM PLATELETS: 291 x1000/??L (ref 150–420)
BKR WAM RDW-CV: 17.3 % — ABNORMAL HIGH (ref 11.0–15.0)
BKR WAM RED BLOOD CELL COUNT.: 2.92 M/ÂµL — ABNORMAL LOW (ref 4.00–6.00)
BKR WAM WHITE BLOOD CELL COUNT: 0.7 x1000/ÂµL — CL (ref 4.0–11.0)

## 2022-06-27 LAB — FIBRINOGEN     (BH GH L LMW YH): BKR FIBRINOGEN LEVEL: 271 mg/dL (ref 194–448)

## 2022-06-27 LAB — LACTATE DEHYDROGENASE
BKR ASPARTATE AMINOTRANSFERASE (AST): 331 U/L — ABNORMAL HIGH (ref 122–241)
BKR LACTATE DEHYDROGENASE: 331 U/L — ABNORMAL HIGH (ref 122–241)

## 2022-06-27 LAB — PHOSPHORUS     (BH GH L LMW YH)
BKR PHOSPHORUS: 2.4 mg/dL (ref 2.2–4.5)
BKR WAM LYMPHOCYTES: 2.4 mg/dL (ref 2.2–4.5)

## 2022-06-27 LAB — URIC ACID: BKR URIC ACID: 2.8 mg/dL (ref 2.7–7.3)

## 2022-06-27 LAB — PT/INR AND PTT (BH GH L LMW YH)
BKR INR: 1.11 (ref 0.86–1.12)
BKR PARTIAL THROMBOPLASTIN TIME: 26.5 s — CL (ref 23.0–31.4)
BKR PROTHROMBIN TIME: 12.2 s — ABNORMAL LOW (ref 9.6–12.3)

## 2022-06-27 MED ORDER — SODIUM CHLORIDE 0.9 % BOLUS (NEW BAG)
0.9 % | Freq: Once | INTRAVENOUS | Status: CP | PRN
Start: 2022-06-27 — End: ?
  Administered 2022-06-27: 16:00:00 0.9 mL/h via INTRAVENOUS

## 2022-06-27 NOTE — Progress Notes
Labs/supportive careNot feeling good today.feeling Blah. Poor appetite,not drinking enough. of NS hydration started. No nausea/vomiting.had bowel movement today.denies neuropathy.no bleeding.HGB=8.2Platelets=291Anc=0.44Neutropenia precaution reinforced.On acyclovir and levaquin.to start voriconazole tonight as instructed and dose reduced ventoclax to 100mg  daily.potential side effects reviewed with the patient and wife.Patient is aware to call for any changes in condition.Plan: rtc  on 5/15

## 2022-06-27 NOTE — Patient Instructions
Patient Education Voriconazole (vor i KOE na zole) Brand Names: [place] Vfend; Vfend IV Brand Names: [place] APO-Voriconazole [DSC]; JAMP-Voriconazole; SANDOZ Voriconazole; TEVA-Voriconazole; Vfend What is this drug used for? It is used to treat fungal infections.What do I need to tell my doctor BEFORE I take this drug? If you are allergic to this drug; any part of this drug; or any other drugs, foods, or substances. Tell your doctor about the allergy and what signs you had.If you have any of these health problems: Low calcium levels, low magnesium levels, or low potassium levels.If you have been told that your body has problems with certain sugars (lactose, glucose, galactose). Some products have lactose.If you take any drugs (prescription or OTC, natural products, vitamins) that must not be taken with this drug, like certain drugs that are used for HIV, infections, or seizures. There are many drugs that must not be taken with this drug.This is not a list of all drugs or health problems that interact with this drug.Tell your doctor and pharmacist about all of your drugs (prescription or OTC, natural products, vitamins) and health problems. You must check to make sure that it is safe for you to take this drug with all of your drugs and health problems. Do not start, stop, or change the dose of any drug without checking with your doctor.What are some things I need to know or do while I take this drug? All products: Tell all of your health care providers that you take this drug. This includes your doctors, nurses, pharmacists, and dentists.Avoid driving and doing other tasks or actions that call for you to be alert or have clear eyesight until you see how this drug affects you.Avoid driving at night.Bright lights may bother you. Wear sunglasses.Tell your doctor if you have signs of high blood sugar like confusion, feeling sleepy, unusual thirst or hunger, passing urine more often, flushing, fast breathing, or breath that smells like fruit.This drug can make your skin very sensitive to the sun. The risk may be raised if you also take other drugs that make your skin sensitive to the sun. You may get a rash or sunburn more easily. Your skin may age faster from the sun. The risk of skin cancer may also be raised. Avoid sun, sunlamps, and tanning beds. Use sunscreen with high SPF. Wear clothing and eyewear that protects you from the sun.Skin cancer has happened in people who took this drug for a long time and had skin sensitive to sunlight. Taking certain other drugs may also raise the risk. Call your doctor right away if you have a change in color or size of a mole or any other skin change or growth.An unsafe heartbeat that is not normal (long QT on ECG) has happened with this drug. Sudden deaths have rarely happened in people taking this drug. Talk with the doctor.Liver problems have rarely happened with this drug. Sometimes, this has been deadly. Call your doctor right away if you have signs of liver problems like dark urine, tiredness, decreased appetite, upset stomach or stomach pain, light-colored stools, throwing up, or yellow skin or eyes.A severe and sometimes deadly reaction has happened. Most of the time, this reaction has signs like fever, rash, or swollen glands with problems in body organs like the liver, kidney, blood, heart, muscles and joints, or lungs. If you have questions, talk with the doctor.An overactive adrenal gland (Cushing's syndrome) has happened when this drug was used with a steroid drug like prednisolone. Call your doctor right away  if you have signs like abnormal weight gain; moon face; fatty hump between the shoulders; thinning of the skin; more hair growth; sweating a lot; darkened skin on the stomach, thighs, breasts, or arms; or easy bruising.If the patient is a child, use this drug with care. The risk of side effects, including serious skin sensitivity, may be higher in children.This drug may cause harm to an unborn baby. If you may become pregnant, you must use birth control while taking this drug. If you get pregnant, call your doctor right away.Tell your doctor if you are breast-feeding. You will need to talk about any risks to your baby.Injection: Rarely, some people have had a reaction during the infusion of this drug. Tell the doctor right away about any flushing, fever, sweating, fast heartbeat, chest tightness, shortness of breath, faintness, upset stomach, itching, or rash during the infusion of this drug.What are some side effects that I need to call my doctor about right away? WARNING/CAUTION: Even though it may be rare, some people may have very bad and sometimes deadly side effects when taking a drug. Tell your doctor or get medical help right away if you have any of the following signs or symptoms that may be related to a very bad side effect:Signs of an allergic reaction, like rash; hives; itching; red, swollen, blistered, or peeling skin with or without fever; wheezing; tightness in the chest or throat; trouble breathing, swallowing, or talking; unusual hoarseness; or swelling of the mouth, face, lips, tongue, or throat.Signs of a pancreas problem (pancreatitis) like very bad stomach pain, very bad back pain, or very bad upset stomach or throwing up.Signs of kidney problems like unable to pass urine, change in how much urine is passed, blood in the urine, or a big weight gain.Signs of lupus like a rash on the cheeks or other body parts, sunburn easy, muscle or joint pain, chest pain or shortness of breath, or swelling in the arms or legs.Signs of electrolyte problems like mood changes; confusion; muscle pain, cramps, or spasms; weakness; shakiness; change in balance; an abnormal heartbeat; seizures; loss of appetite; or severe upset stomach or throwing up.Signs of high or low blood pressure like very bad headache or dizziness, passing out, or change in eyesight.Signs of a weak adrenal gland like a severe upset stomach or throwing up, severe dizziness or passing out, muscle weakness, feeling very tired, mood changes, decreased appetite, or weight loss.Fever or chills.Bone pain.Fast or abnormal heartbeat.Hallucinations (seeing or hearing things that are not there).Swelling.Any unexplained bruising or bleeding.Change in eyesight.Sunburn.Skin reaction to light.A severe skin reaction (Stevens-Johnson syndrome/toxic epidermal necrolysis) may happen. It can cause severe health problems that may not go away, and sometimes death. Get medical help right away if you have signs like red, swollen, blistered, or peeling skin (with or without fever); red or irritated eyes; or sores in your mouth, throat, nose, or eyes.What are some other side effects of this drug? All drugs may cause side effects. However, many people have no side effects or only have minor side effects. Call your doctor or get medical help if any of these side effects or any other side effects bother you or do not go away:Dizziness or headache.Constipation, diarrhea, stomach pain, upset stomach, or throwing up.Mouth irritation.Signs of a common cold.Cough.These are not all of the side effects that may occur. If you have questions about side effects, call your doctor. Call your doctor for medical advice about side effects.You may report side effects to your national health agency.You may report  side effects to the FDA at 1-629-647-8996. You may also report side effects at Natworking[employer]hu.How is this drug best taken? Use this drug as ordered by your doctor. Read all information given to you. Follow all instructions closely.All oral products: Take on an empty stomach. Take 1 hour before or 1 hour after meals.Keep taking this drug as you have been told by your doctor or other health care provider, even if you feel well.Liquid (suspension): Shake well before use.Measure liquid doses carefully. Use the measuring device that comes with this drug. If there is none, ask the pharmacist for a device to measure this drug.Do not mix with other liquids.Injection: It is given as an infusion into a vein over a period of time.All products: Have an eye exam if you are on this drug for a long time. Talk with your doctor.Have blood work checked as you have been told by the doctor. Talk with the doctor.Do not use longer than you have been told. A second infection may happen.What do I do if I miss a dose? All oral products: Take a missed dose as soon as you think about it.If it is close to the time for your next dose, skip the missed dose and go back to your normal time.Do not take 2 doses at the same time or extra doses.Injection: Call your doctor to find out what to do.How do I store and/or throw out this drug? All oral products: Store at room temperature. Do not refrigerate or freeze.Keep lid tightly closed.Store in a dry place. Do not store in a bathroom.Liquid (suspension): Throw away any part not used 2 weeks after this drug was mixed.Injection: If you need to store this drug at home, talk with your doctor, nurse, or pharmacist about how to store it.All products: Keep all drugs in a safe place. Keep all drugs out of the reach of children and pets.Throw away unused or expired drugs. Do not flush down a toilet or pour down a drain unless you are told to do so. Check with your pharmacist if you have questions about the best way to throw out drugs. There may be drug take-back programs in your area.General drug facts If your symptoms or health problems do not get better or if they become worse, call your doctor.Do not share your drugs with others and do not take anyone else's drugs.Some drugs may have another patient information leaflet. If you have any questions about this drug, please talk with your doctor, nurse, pharmacist, or other health care provider.Some drugs may have another patient information leaflet. Check with your pharmacist. If you have any questions about this drug, please talk with your doctor, nurse, pharmacist, or other health care provider.If you think there has been an overdose, call your poison control center or get medical care right away. Be ready to tell or show what was taken, how much, and when it happened.Consumer Information Use and Disclaimer This generalized information is a limited summary of diagnosis, treatment, and/or medication information. It is not meant to be comprehensive and should be used as a tool to help the user understand and/or assess potential diagnostic and treatment options. It does NOT include all information about conditions, treatments, medications, side effects, or risks that may apply to a specific patient. It is not intended to be medical advice or a substitute for the medical advice, diagnosis, or treatment of a health care provider based on the health care provider's examination and assessment of a patient's specific and unique circumstances. Patients must speak  with a health care provider for complete information about their health, medical questions, and treatment options, including any risks or benefits regarding use of medications. This information does not endorse any treatments or medications as safe, effective, or approved for treating a specific patient. UpToDate, Inc. and its affiliates disclaim any warranty or liability relating to this information or the use thereof. The use of this information is governed by the Terms of Use, available at https://www.wolterskluwer.com/en/know/clinical-effectiveness-terms.Last Reviewed Date 2022-09-29Copyright ? 2023 UpToDate, Inc. and its affiliates and/or licensors. All rights reserved.

## 2022-06-28 ENCOUNTER — Telehealth: Admit: 2022-06-28 | Payer: PRIVATE HEALTH INSURANCE | Attending: Adult Health

## 2022-06-28 NOTE — Telephone Encounter
APP on call.  Received call from patient that he had some mild diarrhea this morning and is concerned around new medication regimen.  Advised to use imodium PRN and alert the office should it worsen or with additional questions.  WIll alert primary team.

## 2022-06-30 LAB — CCS, HEME     (YH)

## 2022-06-30 MED ORDER — ALLOPURINOL 300 MG TABLET
300 mg | ORAL_TABLET | ORAL | 1 refills | Status: AC
Start: 2022-06-30 — End: ?

## 2022-07-01 ENCOUNTER — Ambulatory Visit: Admit: 2022-07-01 | Payer: MEDICARE

## 2022-07-01 ENCOUNTER — Encounter: Admit: 2022-07-01 | Payer: MEDICARE

## 2022-07-01 ENCOUNTER — Encounter: Admit: 2022-07-01 | Payer: PRIVATE HEALTH INSURANCE

## 2022-07-01 ENCOUNTER — Inpatient Hospital Stay: Admit: 2022-07-01 | Discharge: 2022-07-01 | Payer: MEDICARE

## 2022-07-01 ENCOUNTER — Encounter: Admit: 2022-07-01 | Payer: PRIVATE HEALTH INSURANCE | Attending: Adult Health

## 2022-07-01 ENCOUNTER — Encounter: Admit: 2022-07-01 | Payer: PRIVATE HEALTH INSURANCE | Attending: Hematology

## 2022-07-01 DIAGNOSIS — F32A Depression: Secondary | ICD-10-CM

## 2022-07-01 DIAGNOSIS — R0609 Other forms of dyspnea: Secondary | ICD-10-CM

## 2022-07-01 DIAGNOSIS — C931 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: Secondary | ICD-10-CM

## 2022-07-01 DIAGNOSIS — C9200 Acute myeloblastic leukemia, not having achieved remission: Secondary | ICD-10-CM

## 2022-07-01 DIAGNOSIS — N400 Enlarged prostate without lower urinary tract symptoms: Secondary | ICD-10-CM

## 2022-07-01 DIAGNOSIS — C4491 Basal cell carcinoma of skin, unspecified: Secondary | ICD-10-CM

## 2022-07-01 DIAGNOSIS — I10 Essential (primary) hypertension: Secondary | ICD-10-CM

## 2022-07-01 DIAGNOSIS — R300 Dysuria: Secondary | ICD-10-CM

## 2022-07-01 DIAGNOSIS — I119 Hypertensive heart disease without heart failure: Secondary | ICD-10-CM

## 2022-07-01 DIAGNOSIS — E782 Mixed hyperlipidemia: Secondary | ICD-10-CM

## 2022-07-01 DIAGNOSIS — H409 Unspecified glaucoma: Secondary | ICD-10-CM

## 2022-07-01 DIAGNOSIS — Z8249 Family history of ischemic heart disease and other diseases of the circulatory system: Secondary | ICD-10-CM

## 2022-07-01 DIAGNOSIS — F419 Anxiety disorder, unspecified: Secondary | ICD-10-CM

## 2022-07-01 DIAGNOSIS — C9110 Chronic lymphocytic leukemia of B-cell type not having achieved remission: Secondary | ICD-10-CM

## 2022-07-01 LAB — URINALYSIS-MACROSCOPIC W/REFLEX MICROSCOPIC
BKR BILIRUBIN, UA: NEGATIVE
BKR BLOOD, UA: NEGATIVE
BKR GLUCOSE, UA: NEGATIVE
BKR KETONES, UA: NEGATIVE
BKR NITRITE, UA: NEGATIVE
BKR PH, UA: 6 (ref 5.5–7.5)
BKR PROTEIN, UA: NEGATIVE
BKR SPECIFIC GRAVITY, UA: 1.011 (ref 1.005–1.030)
BKR UROBILINOGEN, UA (MG/DL): <2 mg/dL (ref <=2.0)

## 2022-07-01 LAB — CBC WITH AUTO DIFFERENTIAL
BKR WAM ABSOLUTE NRBC (2 DEC): 0 x 1000/ÂµL (ref 0.00–1.00)
BKR WAM ANALYZER ANC: 0.19 x 1000/ÂµL — ABNORMAL LOW (ref 2.00–7.60)
BKR WAM HEMATOCRIT (2 DEC): 24.7 % — ABNORMAL LOW (ref 38.50–50.00)
BKR WAM HEMOGLOBIN: 7.9 g/dL — ABNORMAL LOW (ref 13.2–17.1)
BKR WAM MCH (PG): 28.8 pg (ref 27.0–33.0)
BKR WAM MCHC: 32 g/dL (ref 31.0–36.0)
BKR WAM MCV: 90.1 fL (ref 80.0–100.0)
BKR WAM MPV: 12.8 fL — ABNORMAL HIGH (ref 8.0–12.0)
BKR WAM NUCLEATED RED BLOOD CELLS: 0 % (ref 0.0–1.0)
BKR WAM PLATELETS: 136 x1000/ÂµL — ABNORMAL LOW (ref 150–420)
BKR WAM RDW-CV: 17.8 % — ABNORMAL HIGH (ref 11.0–15.0)
BKR WAM RED BLOOD CELL COUNT.: 2.74 M/ÂµL — ABNORMAL LOW (ref 4.00–6.00)
BKR WAM WHITE BLOOD CELL COUNT: 0.5 x1000/ÂµL — CL (ref 4.0–11.0)

## 2022-07-01 LAB — COMPREHENSIVE METABOLIC PANEL
BKR A/G RATIO: 1.5 (ref 1.0–2.2)
BKR ALANINE AMINOTRANSFERASE (ALT): 16 U/L (ref 9–59)
BKR ALBUMIN: 4.2 g/dL (ref 3.6–5.1)
BKR ALKALINE PHOSPHATASE: 98 U/L (ref 9–122)
BKR ANION GAP: 11 fL — ABNORMAL HIGH (ref 7–17)
BKR ASPARTATE AMINOTRANSFERASE (AST): 17 U/L (ref 10–35)
BKR AST/ALT RATIO: 1.1 x 1000/ÂµL (ref 0.00–1.00)
BKR BILIRUBIN TOTAL: 1 mg/dL — ABNORMAL LOW (ref 2.00–<=1.2)
BKR BLOOD UREA NITROGEN: 11 mg/dL (ref 8–23)
BKR BUN / CREAT RATIO: 10.5 (ref 8.0–23.0)
BKR CALCIUM: 9 mg/dL (ref 8.8–10.2)
BKR CHLORIDE: 106 mmol/L — ABNORMAL HIGH (ref 98–107)
BKR CO2: 21 mmol/L — ABNORMAL LOW (ref 20–30)
BKR CREATININE: 1.05 mg/dL (ref 0.40–1.30)
BKR EGFR, CREATININE (CKD-EPI 2021): >60 mL/min/{1.73_m2} (ref >=60)
BKR GLOBULIN: 2.8 g/dL (ref 2.0–3.9)
BKR GLUCOSE: 124 mg/dL — ABNORMAL HIGH (ref 70–100)
BKR POTASSIUM: 3.7 mmol/L (ref 3.3–5.3)
BKR PROTEIN TOTAL: 7 g/dL (ref 5.9–8.3)
BKR SODIUM: 138 mmol/L (ref 136–144)

## 2022-07-01 LAB — URINE MICROSCOPIC     (BH GH LMW YH)
BKR RBC/HPF INSTRUMENT: 1 /HPF (ref 0–2)
BKR WBC/HPF INSTRUMENT: 3 /HPF (ref 0–5)

## 2022-07-01 LAB — PHOSPHORUS     (BH GH L LMW YH): BKR PHOSPHORUS: 2.2 mg/dL (ref 2.2–4.5)

## 2022-07-01 LAB — URIC ACID: BKR URIC ACID: 3.1 mg/dL (ref 2.7–7.3)

## 2022-07-01 LAB — LACTATE DEHYDROGENASE: BKR LACTATE DEHYDROGENASE: 244 U/L — ABNORMAL HIGH (ref 122–241)

## 2022-07-01 MED ORDER — ACETAMINOPHEN 325 MG TABLET
325 mg | Freq: Once | ORAL | Status: CP
Start: 2022-07-01 — End: ?
  Administered 2022-07-01: 19:00:00 325 mg via ORAL

## 2022-07-01 MED ORDER — LEVOFLOXACIN 500 MG TABLET
500 mg | Freq: Every day | ORAL | Status: AC
Start: 2022-07-01 — End: ?

## 2022-07-01 MED ORDER — DIPHENHYDRAMINE 25 MG CAPSULE
25 mg | Freq: Once | ORAL | Status: CP
Start: 2022-07-01 — End: ?
  Administered 2022-07-01: 19:00:00 25 mg via ORAL

## 2022-07-01 NOTE — Progress Notes
Day + 16 decitabine, continues to take reduced dose of venetoclex, 100mg  po. Labs and supportive care needs due today.VSS. See focused reassessment. Douglas [name] reports good appetite but a little less than normal. He has some nausea w/o vomiting. Taking zofran as needed. He has new symptom of a slower than normal urine stream and burning. Urine sent for ua/c+s.  He was instructed to continue his voriconazole. He thought that it may be causing his urinary symptom/slow stream. He has a small bump on R side face/cheek. Was seeing dermatologist in [facility] who froze it. He would like to see dermatologist in [place] because the next plan of treatment was to shave it if it persisted. He was instructed to not see dermatology at this time since his anc is very low/infection risk. Douglas [name]. Notified by text message. Chemistries wdl. Complete Blood CountResults in Past 7 DaysResult Component Current Result Hematocrit 24.70 (L) (07/01/2022) Hemoglobin 7.9 (L) (07/01/2022) MCH 28.8 (07/01/2022) MCHC 32.0 (07/01/2022) MCV 90.1 (07/01/2022) MPV 12.8 (H) (07/01/2022) Platelets 136 (L) (07/01/2022) RBC 2.74 (L) (07/01/2022) WBC 0.5 (LL) (07/01/2022)  Med list reconciled He is taking Levaquin, voriconazole, and acyclovir. He was premedicated with tylenol and benadryl, given 1 unit prbc, to complete with late RN. He returns to clinic on 5/18 for labs/supportive care needs.

## 2022-07-01 NOTE — Progress Notes
Late RN note:RBCs completed w/o issue. Hickman flushed and capped. Discharged home in stable condition.

## 2022-07-02 LAB — URINE CULTURE: BKR URINE CULTURE, ROUTINE: NO GROWTH (ref 1.005–1.030)

## 2022-07-04 ENCOUNTER — Telehealth: Admit: 2022-07-04 | Payer: PRIVATE HEALTH INSURANCE | Attending: Family

## 2022-07-04 ENCOUNTER — Encounter: Admit: 2022-07-04 | Payer: PRIVATE HEALTH INSURANCE

## 2022-07-04 ENCOUNTER — Inpatient Hospital Stay: Admit: 2022-07-04 | Discharge: 2022-07-04 | Payer: MEDICARE

## 2022-07-04 DIAGNOSIS — C4491 Basal cell carcinoma of skin, unspecified: Secondary | ICD-10-CM

## 2022-07-04 DIAGNOSIS — Z8249 Family history of ischemic heart disease and other diseases of the circulatory system: Secondary | ICD-10-CM

## 2022-07-04 DIAGNOSIS — R0609 Other forms of dyspnea: Secondary | ICD-10-CM

## 2022-07-04 DIAGNOSIS — C931 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: Secondary | ICD-10-CM

## 2022-07-04 DIAGNOSIS — C9200 Acute myeloblastic leukemia, not having achieved remission: Secondary | ICD-10-CM

## 2022-07-04 DIAGNOSIS — H409 Unspecified glaucoma: Secondary | ICD-10-CM

## 2022-07-04 DIAGNOSIS — E782 Mixed hyperlipidemia: Secondary | ICD-10-CM

## 2022-07-04 DIAGNOSIS — F419 Anxiety disorder, unspecified: Secondary | ICD-10-CM

## 2022-07-04 DIAGNOSIS — I10 Essential (primary) hypertension: Secondary | ICD-10-CM

## 2022-07-04 DIAGNOSIS — I119 Hypertensive heart disease without heart failure: Secondary | ICD-10-CM

## 2022-07-04 DIAGNOSIS — R791 Abnormal coagulation profile: Secondary | ICD-10-CM

## 2022-07-04 DIAGNOSIS — F32A Depression: Secondary | ICD-10-CM

## 2022-07-04 DIAGNOSIS — N400 Enlarged prostate without lower urinary tract symptoms: Secondary | ICD-10-CM

## 2022-07-04 DIAGNOSIS — C9110 Chronic lymphocytic leukemia of B-cell type not having achieved remission: Secondary | ICD-10-CM

## 2022-07-04 LAB — COMPREHENSIVE METABOLIC PANEL
BKR A/G RATIO: 1.5 (ref 1.0–2.2)
BKR ALANINE AMINOTRANSFERASE (ALT): 14 U/L (ref 9–59)
BKR ALBUMIN: 4.2 g/dL (ref 3.6–5.1)
BKR ALKALINE PHOSPHATASE: 96 U/L (ref 9–122)
BKR ANION GAP: 10 pg/mL — ABNORMAL HIGH (ref 7–17)
BKR ASPARTATE AMINOTRANSFERASE (AST): 20 U/L (ref 10–35)
BKR AST/ALT RATIO: 1.4 u[IU]/mL
BKR BILIRUBIN TOTAL: 1 mg/dL (ref <=1.2)
BKR BLOOD UREA NITROGEN: 13 mg/dL (ref 0.00–0.40)
BKR BUN / CREAT RATIO: 12.7 mg/dL (ref 8.0–23.0)
BKR CALCIUM: 9 mg/dL — ABNORMAL HIGH (ref 4.0–5.6)
BKR CHLORIDE: 108 mmol/L — ABNORMAL HIGH (ref 98–107)
BKR CO2: 23 mmol/L — ABNORMAL LOW (ref 20–30)
BKR CREATININE: 1.02 mg/dL (ref 0.40–1.30)
BKR EGFR, CREATININE (CKD-EPI 2021): >60 mL/min/{1.73_m2} (ref >=60)
BKR GLOBULIN: 2.8 g/dL (ref 2.0–3.9)
BKR GLUCOSE: 85 mg/dL (ref 70–100)
BKR POTASSIUM: 4.1 mmol/L (ref 3.3–5.3)
BKR PROTEIN TOTAL: 7 g/dL (ref 5.9–8.3)
BKR SODIUM: 141 mmol/L (ref 136–144)

## 2022-07-04 LAB — CBC WITH AUTO DIFFERENTIAL
BKR WAM ABSOLUTE NRBC (2 DEC): 0 x 1000/ÂµL (ref 0.00–1.00)
BKR WAM ANALYZER ANC: 0.08 x 1000/ÂµL — ABNORMAL LOW (ref 2.00–7.60)
BKR WAM HEMATOCRIT (2 DEC): 27.6 % — ABNORMAL LOW (ref 38.50–50.00)
BKR WAM HEMOGLOBIN: 8.6 g/dL — ABNORMAL LOW (ref 13.2–17.1)
BKR WAM MCH (PG): 28.1 pg (ref 27.0–33.0)
BKR WAM MCHC: 31.2 g/dL (ref 31.0–36.0)
BKR WAM MCV: 90.2 fL (ref 80.0–100.0)
BKR WAM NUCLEATED RED BLOOD CELLS: 0 % (ref 0.0–1.0)
BKR WAM PLATELETS: 90 x1000/ÂµL — ABNORMAL LOW (ref 150–420)
BKR WAM RDW-CV: 18.8 % — ABNORMAL HIGH (ref 11.0–15.0)
BKR WAM RED BLOOD CELL COUNT.: 3.06 M/ÂµL — ABNORMAL LOW (ref 4.00–6.00)
BKR WAM WHITE BLOOD CELL COUNT: 0.3 x1000/ÂµL — CL (ref 4.0–11.0)

## 2022-07-04 LAB — PT/INR AND PTT (BH GH L LMW YH)
BKR INR: 1.06 — AB (ref 0.86–1.12)
BKR LACTATE DEHYDROGENASE: 26.8 seconds — ABNORMAL HIGH (ref 23.0–31.4)
BKR PARTIAL THROMBOPLASTIN TIME: 26.8 s (ref 23.0–31.4)
BKR PROTHROMBIN TIME: 11.7 s (ref 9.6–12.3)

## 2022-07-04 LAB — URIC ACID: BKR URIC ACID: 2.8 mg/dL (ref 2.7–7.3)

## 2022-07-04 LAB — IMMATURE PLATELET FRACTION (BH GH YH)
BKR WAM IPF, ABSOLUTE: 4.6 x1000/ÂµL — ABNORMAL LOW (ref 38.50–<20.0)
BKR WAM IPF: 5.1 % — ABNORMAL LOW (ref 1.2–8.6)

## 2022-07-04 LAB — PHOSPHORUS     (BH GH L LMW YH): BKR PHOSPHORUS: 2.3 mg/dL (ref 2.2–4.5)

## 2022-07-04 LAB — FIBRINOGEN     (BH GH L LMW YH): BKR FIBRINOGEN LEVEL: 346 mg/dL (ref 194–448)

## 2022-07-04 NOTE — Telephone Encounter
APP On-Call Telephone NoteLab called to report WBC of 0.3 and ANC of 0.08. SPoke with nurse providing care, they are aware, similar to previous labs. Elenore Rota, APRN

## 2022-07-04 NOTE — Progress Notes
[place] arrives to clinic for labs possHe reports being able to urinate easier, feels well overallHickman dressing changed as it was coming up along the sides with an IV3000 and biopatchCaps and connectors also changedLabs drawn and sentWBC: 0.3HGB: 8.6PLT: 90ANC: 0.08No transfusions indicated today. He and his wife confirm that he is taking his anti fungal, antibiotic and anti viral ppx. He will RTC on Wednesday for labs poss. D/c with his wife in stable condition

## 2022-07-06 ENCOUNTER — Encounter: Admit: 2022-07-06 | Payer: PRIVATE HEALTH INSURANCE | Attending: Hematology & Oncology

## 2022-07-06 ENCOUNTER — Telehealth: Admit: 2022-07-06 | Payer: PRIVATE HEALTH INSURANCE

## 2022-07-06 NOTE — Telephone Encounter
Returned call to patient who stated that he has a rash on his chest. It is flat, red, non-itchy. It is not spreading. He states that it looks like irritation from the hickman line caps rubbing on his skin. This has been going on since Saturday. He denies fever, chills, chest pain, SOB. He will try to send a picture via email or MyChart. I advised him that I will send a message to the team.

## 2022-07-06 NOTE — Telephone Encounter
Message routed to Dr Sherrell Puller service

## 2022-07-08 ENCOUNTER — Inpatient Hospital Stay: Admit: 2022-07-08 | Discharge: 2022-07-08 | Payer: MEDICARE

## 2022-07-08 ENCOUNTER — Ambulatory Visit: Admit: 2022-07-08 | Payer: MEDICARE

## 2022-07-08 ENCOUNTER — Ambulatory Visit: Admit: 2022-07-08 | Payer: MEDICARE | Attending: Family

## 2022-07-08 DIAGNOSIS — C931 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: Secondary | ICD-10-CM

## 2022-07-08 DIAGNOSIS — R21 Rash and other nonspecific skin eruption: Secondary | ICD-10-CM

## 2022-07-08 LAB — COMPREHENSIVE METABOLIC PANEL
BKR A/G RATIO: 1.5 (ref 1.0–2.2)
BKR ALANINE AMINOTRANSFERASE (ALT): 14 U/L (ref 9–59)
BKR ALBUMIN: 4.3 g/dL (ref 3.6–5.1)
BKR ALKALINE PHOSPHATASE: 112 U/L — ABNORMAL LOW (ref 9–122)
BKR ANION GAP: 9 x1000/ÂµL — ABNORMAL LOW (ref 7–17)
BKR ASPARTATE AMINOTRANSFERASE (AST): 21 U/L (ref 10–35)
BKR AST/ALT RATIO: 1.5
BKR BILIRUBIN TOTAL: 1.1 mg/dL (ref 0.0–<=1.2)
BKR BLOOD UREA NITROGEN: 11 mg/dL (ref 8–23)
BKR BUN / CREAT RATIO: 10.4 (ref 8.0–23.0)
BKR CALCIUM: 9.1 mg/dL (ref 8.8–10.2)
BKR CHLORIDE: 109 mmol/L — ABNORMAL HIGH (ref 98–107)
BKR CO2: 22 mmol/L (ref 20–30)
BKR CREATININE: 1.06 mg/dL (ref 0.40–1.30)
BKR EGFR, CREATININE (CKD-EPI 2021): >60 mL/min/{1.73_m2} (ref >=60)
BKR GLOBULIN: 2.8 g/dL (ref 2.0–3.9)
BKR GLUCOSE: 95 mg/dL — ABNORMAL LOW (ref 70–100)
BKR POTASSIUM: 4.2 mmol/L (ref 3.3–5.3)
BKR PROTEIN TOTAL: 7.1 g/dL (ref 5.9–8.3)
BKR SODIUM: 140 mmol/L (ref 136–144)

## 2022-07-08 LAB — CBC WITH AUTO DIFFERENTIAL
BKR WAM ABSOLUTE NRBC (2 DEC): 0 x 1000/ÂµL (ref 0.00–1.00)
BKR WAM ANALYZER ANC: 0.03 x 1000/ÂµL — ABNORMAL LOW (ref 2.00–7.60)
BKR WAM HEMATOCRIT (2 DEC): 29 % — ABNORMAL LOW (ref 38.50–50.00)
BKR WAM HEMOGLOBIN: 9 g/dL — ABNORMAL LOW (ref 13.2–17.1)
BKR WAM MCH (PG): 27.9 pg (ref 27.0–33.0)
BKR WAM MCHC: 31 g/dL (ref 31.0–36.0)
BKR WAM MCV: 89.8 fL (ref 80.0–100.0)
BKR WAM NUCLEATED RED BLOOD CELLS: 0 % (ref 0.0–1.0)
BKR WAM PLATELETS: 72 x1000/ÂµL — ABNORMAL LOW (ref 150–420)
BKR WAM RDW-CV: 18.6 % — ABNORMAL HIGH (ref 11.0–15.0)
BKR WAM RED BLOOD CELL COUNT.: 3.23 M/ÂµL — ABNORMAL LOW (ref 4.00–6.00)
BKR WAM WHITE BLOOD CELL COUNT: 0.2 x1000/ÂµL — CL (ref 4.0–11.0)

## 2022-07-08 LAB — IMMATURE PLATELET FRACTION (BH GH YH)
BKR WAM IPF, ABSOLUTE: 2.9 x1000/ÂµL — ABNORMAL HIGH (ref 20–<20.0)
BKR WAM IPF: 4 % — ABNORMAL HIGH (ref 1.2–8.6)

## 2022-07-08 NOTE — Progress Notes
Patient seen by Claudia Desanctis APRN in clinic related to rash.Patient evaluated by APRN, patient to use steroid cream over the counter on affected area , call if any changes or rash spreads to other areas. Patient had labs drawn in apheresis today , no transfusions required.

## 2022-07-08 NOTE — Progress Notes
Douglas [name] arrives to clinic for labs poss. Patient reports worsening rash at right chest around the site of the hickman dressing. Rash does not appear to be infection related, but has become darker since first appearing on Saturday. Reached out to [employer] and sent pictures for reference. Patient denies any itching or discomfort at the site of the rash. Patient also complains of mouth sores that have been bleeding, possibly contributing to an upset stomach. Patient reports regular BMs with no darkness noted. Labs drawn and sent as ordered. Caps and connections changed.  WBC: 0.2HGB: 9.0PLT: 72ANC: 0.03 No transfusions indicated today. He and his wife confirm that he is taking his anti fungal, antibiotic and anti viral ppx. Per Parke Poisson, patient is to go to [facility] clinic today for exam and follow-up of rash at right chest and oral lesions.

## 2022-07-09 ENCOUNTER — Telehealth: Admit: 2022-07-09 | Payer: PRIVATE HEALTH INSURANCE | Attending: Hematology

## 2022-07-09 MED FILL — VORICONAZOLE 200 MG TABLET: 200 mg | ORAL | 15 days supply | Qty: 30 | Fill #1 | Status: CP

## 2022-07-09 NOTE — Telephone Encounter
PRE-CALL: [place] scan guided needle Bone Marrow biopsy and aspiration with moderate sedation 07/14/2022 Tuesday ARRIVAL TIME: 8:40 am CHECK-IN: Please enter through: 	[facility]            [address] [place], [place] [address] [facility]. [place] on the 2nd floor of the Diagnostic RadiologyDepartment. We have front desk staff to help you locate it. You may request a wheelchair at the entrance.WHERE TO PARK:Parking Options: 	At [facility]:Self Park on levels 4, 6 or 8 of the [address] Rights [place], [address] [place], which are reserved for Patient Visitor Parking.  $1/ hour for the first 7 hours. 7-24 hour parking stay is $20.00. Please note these rates apply only to parking on levels 4, 6 and 8. You may enter the hospital on level 4 of [employer] or by taking the pedestrian bridge on level 2 to the main hospital entrance.Valet park at [facility] Entrance entrance [address] 0-7 hours is $15.00. 7-24 hours is $25.00.	NOTHING BY MOUTH:MAY TAKE ONLY SIPS OF WATER ON THE MORNING OF PROCEDURE FOR YOUR MEDICATIONS. I have reviwed your medications and you have nothing to hold before the procedure date. You were concerned about the timing of your cancer medication which you take at 9am with food. I advised  you to bring it with you to take after the procedure. We do offer you snacks, water or juice after your procedure. COMPANION/RIDE: wife Okey Regal WHAT TO WEAR: Wear comfortable clothing that you can remove easily to change into our hospital gown or scrubs, preferably jogging pants or pull up pants without zippers or buttons. Remove all jewelry, and body piercing's. Bring a box or case to keep contacts/glasses and removable orthodontic appliances during procedure.We do have lockers to keep your belongings safe. Lab orders called in to answering service of Donald Siva, APRN

## 2022-07-11 ENCOUNTER — Inpatient Hospital Stay: Admit: 2022-07-11 | Discharge: 2022-07-11 | Payer: MEDICARE

## 2022-07-11 VITALS — BP 132/72 | HR 73 | Temp 97.70000°F | Resp 18 | Wt 249.6 lb

## 2022-07-11 DIAGNOSIS — C931 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: Principal | ICD-10-CM

## 2022-07-11 LAB — COMPREHENSIVE METABOLIC PANEL
BKR A/G RATIO: 1.5 (ref 1.0–2.2)
BKR ALANINE AMINOTRANSFERASE (ALT): 14 U/L (ref 9–59)
BKR ALBUMIN: 4.2 g/dL (ref 3.6–5.1)
BKR ALKALINE PHOSPHATASE: 105 U/L (ref 9–122)
BKR ANION GAP: 11 g/dL — ABNORMAL LOW (ref 7–17)
BKR ASPARTATE AMINOTRANSFERASE (AST): 21 U/L — ABNORMAL LOW (ref 10–35)
BKR AST/ALT RATIO: 1.5
BKR BILIRUBIN TOTAL: 0.8 mg/dL (ref <=1.2)
BKR BLOOD UREA NITROGEN: 12 mg/dL (ref 8–23)
BKR BUN / CREAT RATIO: 12.1 (ref 8.0–23.0)
BKR CALCIUM: 9 mg/dL (ref 8.8–10.2)
BKR CHLORIDE: 108 mmol/L — ABNORMAL HIGH (ref 98–107)
BKR CO2: 21 mmol/L (ref 20–30)
BKR CREATININE: 0.99 mg/dL — ABNORMAL HIGH (ref 0.40–1.30)
BKR EGFR, CREATININE (CKD-EPI 2021): >60 mL/min/{1.73_m2} (ref >=60)
BKR GLOBULIN: 2.8 g/dL (ref 2.0–3.9)
BKR GLUCOSE: 92 mg/dL (ref 70–100)
BKR POTASSIUM: 4.3 mmol/L — ABNORMAL LOW (ref 3.3–5.3)
BKR PROTEIN TOTAL: 7 g/dL (ref 5.9–8.3)
BKR SODIUM: 140 mmol/L (ref 136–144)

## 2022-07-11 LAB — CBC WITH AUTO DIFFERENTIAL
BKR WAM ABSOLUTE NRBC (2 DEC): 0 x 1000/ÂµL (ref 0.00–1.00)
BKR WAM ANALYZER ANC: 0.11 x 1000/ÂµL — ABNORMAL LOW (ref 2.00–7.60)
BKR WAM BASOPHILS: 0.8 mg/dL (ref <=1.2)
BKR WAM EOSINOPHIL ABSOLUTE COUNT.: 1.5
BKR WAM EOSINOPHILS: 4.2 g/dL (ref 3.6–5.1)
BKR WAM HEMATOCRIT (2 DEC): 30.2 % — ABNORMAL LOW (ref 38.50–50.00)
BKR WAM HEMOGLOBIN: 9 g/dL — ABNORMAL LOW (ref 13.2–17.1)
BKR WAM MCH (PG): 27.1 pg (ref 27.0–33.0)
BKR WAM MCHC: 29.8 g/dL — ABNORMAL LOW (ref 31.0–36.0)
BKR WAM MCV: 91 fL — ABNORMAL HIGH (ref 80.0–100.0)
BKR WAM MONOCYTE ABSOLUTE COUNT.: 1.5 (ref 1.0–2.2)
BKR WAM MPV: 14.2 fL — ABNORMAL HIGH (ref 8.0–12.0)
BKR WAM NEUTROPHILS: 9 mg/dL (ref 8.8–10.2)
BKR WAM NUCLEATED RED BLOOD CELLS: 0 % (ref 0.0–1.0)
BKR WAM PLATELETS: 108 x1000/ÂµL — ABNORMAL LOW (ref 150–420)
BKR WAM RDW-CV: 18.1 % — ABNORMAL HIGH (ref 11.0–15.0)
BKR WAM RED BLOOD CELL COUNT.: 3.32 M/ÂµL — ABNORMAL LOW (ref 4.00–6.00)
BKR WAM WHITE BLOOD CELL COUNT: 0.4 x1000/ÂµL — CL (ref 4.0–11.0)

## 2022-07-11 NOTE — Progress Notes
Presented to clinic in stable condition for labs/possible transfusion.  Pt denied change in condition.  DL HIckman intact with sterile transparent dsrg.  Drsg due for change today.  Lumens patent with brisk blood return.  CBC/CMP and type and screen drawn and processed.  Hgb = 9.0/Hct= 30.2, plt [place] = 108,000 and anc= 0.11. No blood products needed today.  Hickman drsg changed via aseptic technique.  New claves attached.  Lumens flushed per protocol.  Green caps applied.  May/June appointment calenders provided.  Pt discharged in stable condition with wife.  RTC Tuesday 5/28 as scheduled.

## 2022-07-14 ENCOUNTER — Inpatient Hospital Stay: Admit: 2022-07-14 | Discharge: 2022-07-14 | Payer: MEDICARE

## 2022-07-14 ENCOUNTER — Encounter: Admit: 2022-07-14 | Payer: PRIVATE HEALTH INSURANCE | Attending: Hematology

## 2022-07-14 ENCOUNTER — Encounter: Admit: 2022-07-14 | Payer: PRIVATE HEALTH INSURANCE

## 2022-07-14 ENCOUNTER — Encounter: Admit: 2022-07-14 | Payer: PRIVATE HEALTH INSURANCE | Attending: Adult Health

## 2022-07-14 DIAGNOSIS — Z7689 Persons encountering health services in other specified circumstances: Secondary | ICD-10-CM

## 2022-07-14 DIAGNOSIS — C931 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: Secondary | ICD-10-CM

## 2022-07-14 DIAGNOSIS — N400 Enlarged prostate without lower urinary tract symptoms: Secondary | ICD-10-CM

## 2022-07-14 DIAGNOSIS — C9110 Chronic lymphocytic leukemia of B-cell type not having achieved remission: Secondary | ICD-10-CM

## 2022-07-14 DIAGNOSIS — H409 Unspecified glaucoma: Secondary | ICD-10-CM

## 2022-07-14 DIAGNOSIS — C9290 Myeloid leukemia, unspecified, not having achieved remission: Secondary | ICD-10-CM

## 2022-07-14 DIAGNOSIS — I10 Essential (primary) hypertension: Secondary | ICD-10-CM

## 2022-07-14 DIAGNOSIS — E782 Mixed hyperlipidemia: Secondary | ICD-10-CM

## 2022-07-14 DIAGNOSIS — F419 Anxiety disorder, unspecified: Secondary | ICD-10-CM

## 2022-07-14 DIAGNOSIS — R0609 Other forms of dyspnea: Secondary | ICD-10-CM

## 2022-07-14 DIAGNOSIS — C4491 Basal cell carcinoma of skin, unspecified: Secondary | ICD-10-CM

## 2022-07-14 DIAGNOSIS — F32A Depression: Secondary | ICD-10-CM

## 2022-07-14 DIAGNOSIS — I119 Hypertensive heart disease without heart failure: Secondary | ICD-10-CM

## 2022-07-14 DIAGNOSIS — Z8249 Family history of ischemic heart disease and other diseases of the circulatory system: Secondary | ICD-10-CM

## 2022-07-14 LAB — BONE MARROW SMEAR, ASPIRATION, AND STAIN      (L YH)

## 2022-07-14 MED ORDER — FENTANYL (PF) 50 MCG/ML INJECTION SOLUTION
50 mcg/mL | Status: CP
Start: 2022-07-14 — End: ?
  Administered 2022-07-14 (×2): 50 mcg/mL via INTRAVENOUS

## 2022-07-14 MED ORDER — LIDOCAINE HCL 10 MG/ML (1 %) INJECTION SOLUTION
101 mg/mL (1 %) | Freq: Once | INTRAMUSCULAR | Status: CP | PRN
Start: 2022-07-14 — End: ?
  Administered 2022-07-14: 14:00:00 10 mL via INTRAMUSCULAR

## 2022-07-14 MED ORDER — MIDAZOLAM (PF) 1 MG/ML INJECTION SOLUTION
1 mg/mL | Status: CP
Start: 2022-07-14 — End: ?
  Administered 2022-07-14 (×2): 1 mg/mL via INTRAVENOUS

## 2022-07-14 MED ORDER — FENTANYL (PF) 50 MCG/ML INJECTION SOLUTION
50 mcg/mL | Status: CP
Start: 2022-07-14 — End: ?

## 2022-07-14 MED ORDER — MIDAZOLAM 1 MG/ML INJECTION SOLUTION
1 mg/mL | Status: CP
Start: 2022-07-14 — End: ?

## 2022-07-14 NOTE — Anesthesia Procedure Notes
Patient placed in a prone position on the table.

## 2022-07-14 NOTE — Progress Notes
History & Physical Attestation and Pre-Procedural Moderate Sedation Assessment History & Physical Attestation: This is a non-emergent procedure. I have completed (or reviewed and attested to) a History and Physical Exam written within the last 30 days, which is in the patient record. The patient has been assessed and examined prior to this procedure and no changes were noted unless otherwise documented here: NoneGeneral: Alert and Oriented x 3Heart: RRR Lungs: CTABReview of Systems: Patient does not report chest pain, fevers, chills, shortness of breath. Physician Airway Assessment:Normal: As the physician certified in moderate sedation and based on my review, immediately prior to this procedure, of the airway evaluation and other pertinent documentation in the patient's record, this patient is a suitable candidate for moderate sedation during the planned procedure.Procedural Sedation Risk Assessment: ASA IIAdditional risks, if any, related to procedural sedation are noted here: NoneASA Physical Status Classification Score: Patient is a completely healthy fit patient.Patient has mild systemic disease.Patient has severe systemic disease that is not incapacitating.Patient has incapacitating disease that is a constant threat to life.A moribund patient who is not expected to live 24 hour with or without surgery.

## 2022-07-14 NOTE — Anesthesia Procedure Notes
9:26 AM scout scan performed, vss, needle positioned.

## 2022-07-14 NOTE — Anesthesia Procedure Notes
9:30 AM patient to recovery area

## 2022-07-14 NOTE — Progress Notes
[facility]EDURE [facility]	Patient Name: Douglas [name] [facility]: [id number] Procedure Date: 5/28/2024Physicians performing the procedure:Dr. 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 SPECIMENS:2 bone marrow aspirations and 1 core biopsy of the posterior left iliac bone. A clot sample was also obtained. Brent General Cassey Bacigalupo, RRA5/28/20249:14 AM

## 2022-07-14 NOTE — Other
Patient completed [place] guided Bone Marrow Biopsy under Moderate Sedation without complication.  Patient tolerated the procedure well.  Vital Signs stable.  Patient fully recovered from sedation, and tolerated PO intake well without nausea or vomiting.  The biopsy site dressing clean and intact.  PIV removed.  D/C instruction given, and Patient verbalized understanding.  Patient brought down to the lobby in wheelchair, accompanied by wife.BP (!) 112/58  - Pulse 67  - Temp 98.1 ?F (36.7 ?C) (Temporal)  - Resp 20  - SpO2 97%

## 2022-07-14 NOTE — Anesthesia Procedure Notes
9:30 AM specimen obtained, site cleansed and dressed, vss, patient denies pain

## 2022-07-14 NOTE — Anesthesia Procedure Notes
9:27 AM scout scan performed, location verified, vss

## 2022-07-14 NOTE — Discharge Instructions
[facility]	 [place] [facility] [place] BIOPSY DISCHARGE INSTRUCTIONSYOUR PROCEDURE WAS PERFORMED BY DR. MOUNIKA GUNDURUSite Care:1.  Remove dressing after 24 hours (1 day).2.  Keep wound clean and dry.3.  May shower with the dressing on.4.  No swimming, hot tubs or tub bath for 5 days.Activity:For the next 24 hours do not drive, consume alcohol, make any legal decisions, operate heavy machinery or perform strenuous activity.Diet:Resume diet prior to procedure as tolerated. Good nutrition promotes healing.Medications:Resume all previous medications without change.Call your doctor if you have:Temperature greater than 100.4Persistent nausea and vomitingSevere uncontrolled painRedness, tenderness, or signs of infection (pain, swelling, redness, odor or green/yellow discharge around the site)Difficulty breathing, headache or visual disturbancesHivesPersistent dizziness or light-headednessExtreme fatigueAny other questions or concerns you may have after dischargeIn an emergency, call 911 or go to an Emergency Department at a nearby hospital.It is important to bring a complete, current list of your medications to any medical appointments or hospitalizations.Contact Us:For any questions or concerns Monday-Friday 7:30am-5:00pm, call [phone] (Musculoskeletal Radiology).  Any other time please call [phone] and ask for the musculoskeletal imaging fellow on call.

## 2022-07-14 NOTE — Anesthesia Procedure Notes
9:20 AM scout scan performed, vss, site marked and prepped

## 2022-07-15 ENCOUNTER — Ambulatory Visit: Admit: 2022-07-15 | Payer: MEDICARE | Attending: Hematology

## 2022-07-15 ENCOUNTER — Inpatient Hospital Stay: Admit: 2022-07-15 | Discharge: 2022-07-15 | Payer: MEDICARE

## 2022-07-15 DIAGNOSIS — C931 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: Secondary | ICD-10-CM

## 2022-07-15 DIAGNOSIS — C9200 Acute myeloblastic leukemia, not having achieved remission: Secondary | ICD-10-CM

## 2022-07-15 DIAGNOSIS — R791 Abnormal coagulation profile: Secondary | ICD-10-CM

## 2022-07-15 LAB — PT/INR AND PTT (BH GH L LMW YH)
BKR INR: 1.15 — ABNORMAL HIGH (ref 0.86–1.12)
BKR PARTIAL THROMBOPLASTIN TIME: 28.5 s (ref 23.0–31.4)
BKR PROTHROMBIN TIME: 12.7 s — ABNORMAL HIGH (ref 9.6–12.3)

## 2022-07-15 LAB — COMPREHENSIVE METABOLIC PANEL
BKR A/G RATIO: 1.4 (ref 1.0–2.2)
BKR ALANINE AMINOTRANSFERASE (ALT): 11 U/L (ref 9–59)
BKR ALBUMIN: 4.1 g/dL (ref 3.6–5.1)
BKR ALKALINE PHOSPHATASE: 116 U/L (ref 9–122)
BKR ANION GAP: 11 (ref 7–17)
BKR ASPARTATE AMINOTRANSFERASE (AST): 28 U/L (ref 10–35)
BKR AST/ALT RATIO: 2.5
BKR BILIRUBIN TOTAL: 0.6 mg/dL (ref <=1.2)
BKR BLOOD UREA NITROGEN: 12 mg/dL (ref 8–23)
BKR BUN / CREAT RATIO: 11.8 (ref 8.0–23.0)
BKR CALCIUM: 8.7 mg/dL — ABNORMAL LOW (ref 8.8–10.2)
BKR CHLORIDE: 107 mmol/L (ref 98–107)
BKR CO2: 20 mmol/L (ref 20–30)
BKR CREATININE: 1.02 mg/dL (ref 0.40–1.30)
BKR EGFR, CREATININE (CKD-EPI 2021): >60 mL/min/{1.73_m2} (ref >=60)
BKR GLOBULIN: 3 g/dL (ref 2.0–3.9)
BKR GLUCOSE: 97 mg/dL (ref 70–100)
BKR POTASSIUM: 4.5 mmol/L (ref 3.3–5.3)
BKR PROTEIN TOTAL: 7.1 g/dL (ref 5.9–8.3)
BKR SODIUM: 138 mmol/L (ref 136–144)

## 2022-07-15 LAB — FIBRINOGEN     (BH GH L LMW YH): BKR FIBRINOGEN LEVEL: 339 mg/dL (ref 194–448)

## 2022-07-15 LAB — PHOSPHORUS     (BH GH L LMW YH): BKR PHOSPHORUS: 2.3 mg/dL (ref 2.2–4.5)

## 2022-07-15 LAB — CBC WITH AUTO DIFFERENTIAL
BKR WAM ABSOLUTE NRBC (2 DEC): 0 x 1000/ÂµL (ref 0.00–1.00)
BKR WAM ANALYZER ANC: 0.32 x 1000/ÂµL — ABNORMAL LOW (ref 2.00–7.60)
BKR WAM HEMATOCRIT (2 DEC): 31.1 % — ABNORMAL LOW (ref 38.50–50.00)
BKR WAM HEMOGLOBIN: 9.3 g/dL — ABNORMAL LOW (ref 13.2–17.1)
BKR WAM MCH (PG): 27.2 pg (ref 27.0–33.0)
BKR WAM MCHC: 29.9 g/dL — ABNORMAL LOW (ref 31.0–36.0)
BKR WAM MCV: 90.9 fL (ref 80.0–100.0)
BKR WAM MPV: 12 fL (ref 8.0–12.0)
BKR WAM NUCLEATED RED BLOOD CELLS: 0 % (ref 0.0–1.0)
BKR WAM PLATELETS: 300 x1000/ÂµL (ref 150–420)
BKR WAM RDW-CV: 17.9 % — ABNORMAL HIGH (ref 11.0–15.0)
BKR WAM RED BLOOD CELL COUNT.: 3.42 M/ÂµL — ABNORMAL LOW (ref 4.00–6.00)
BKR WAM WHITE BLOOD CELL COUNT: 0.6 x1000/ÂµL — CL (ref 4.0–11.0)

## 2022-07-15 LAB — URIC ACID: BKR URIC ACID: 3.1 mg/dL (ref 2.7–7.3)

## 2022-07-15 LAB — LACTATE DEHYDROGENASE: BKR LACTATE DEHYDROGENASE: 358 U/L — ABNORMAL HIGH (ref 122–241)

## 2022-07-15 NOTE — Progress Notes
Follow-up Patient Evaluation - Malignant HematologyVISIT DATE:  May 22, 2024NAME: Douglas [name]  MRN: [id number] DOB: 19-Nov-1952AGE: 72 y.o.                                              Diagnosis: CMML/MDS subtype Current treatment: Aza/Ven C1D1 4/30/24HISTORY OF PRESENT ILLNESS: [name] is a 72 year old man with a history of depression, anxiety, iron deficiency anemia, CMML (diagnosed 12/2019 at [facility] when he lived in [place]; TET2, ASXL1, [name]). He presented in 07/2021 to establish hematologic malignancies care given that his daughter lives in [place]. He resides in [place] and is under the care of Dr. Sofie [name] of [facility] in [place]. He established care with Dr. Mel [name] 05/14/21. He has been under observation for the CMML. Around February, his Hb was found to be 5 for which he received 2U of PRBC. He had had no viral syndrome prior. 3-4 weeks prior he had sustained a bad fall in which his LLE became significantly swollen. He was to have a [place] LLE which did not occur. There is a question of whether he could have bled into his leg. At the time he became progressively fatigued and SOB. He had a cardiac evaluation including a pharmacologic stress test which was reported to him as normal. He did not have endoscopies; last colonoscopy was in 2021. He was found to be iron deficient and his Hb responded to IV iron rising to 12.2 in 12/2020. A bone marrow biopsy and aspiration was repeat 05/16/21 and reported to be stable. 07/08/21 he saw Dr. Mendel [name] at [facility] who recommended deferring allogeneic SCT until the time of progression and HMA until the time of critical cytopenias. At baseline he is chronically tired, sleeps very poorly; has been recommended to have sleep test.He is not limited in his activities; walks once a day for 20 minutes.In [place] he was bicycling and walking up to 3-4X/day. Dr. 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 He was offered admission at [facility] and decided to come to  to be treated near his family. Review of CBC in care everywhere shows that WBC count trended down to 1.29, ANC to 0.31 with Hb 12.4 on the same date and platelets 121, 01/16/22.The following CBC on 04/07/22 showed WBC 1.4, ANC 0.75, Hb 5.9, MCV 101, Platelets 383 for which he was transfused 2U of PRBC on 04/09/22 and 1U of PRBC on 04/24/22.The anemia w/u on 04/07/22 showed ferritin 15, epo level 632, TSH 4.2, B12 1342, folate >20, retic 6.29%, LDH 257(ULN 246).The development of severe bicytopenia triggered the bone marrow biopsy and aspiration 04/28/22.He was subsequently started on venofer and had 5-6 infusions of 200 mg. INTERIM HISTORY:[name] returns to clinic for a rash on his chest that began 07/04/22 (4 days ago). He denies change in detergent, soap, lotion, new prescription or OTC medications. He also reports intermittent GI upset he thinks is related to his intake. He discussed recent mouth sores upper mid inner lip and lateral tongue that have resolved. Denies fever, chills, sob, cough, n/v, bowel or bladder symptoms, bleeding, edema. He is accompanied by his wife. 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  allopurinoL (ZYLOPRIM) 300 mg tablet take 1 tablet by mouth every day  amLODIPine (NORVASC) 10 mg tablet Take 1 tablet (10 mg total) by mouth daily.  aspirin 81 mg EC delayed release tablet Take 1 tablet (81 mg total) by mouth.  atorvastatin (LIPITOR) 40 mg tablet take 1 tablet by mouth at bedtime  cholecalciferol (VITAMIN D-3) 50 mcg (2,000 unit) capsule Take 1 capsule (2,000 Units total) by mouth daily. (Patient not taking: Reported on 07/01/2022)  coenzyme Q10 300 mg Cap Take 300 mg by mouth. (Patient not taking: Reported on 07/01/2022)  finasteride (PROSCAR) 5 mg tablet TAKE 1 TABLET BY MOUTH DAILY FOR ENLARGED PROSTATE WITH URINATION PROBLEM TAKE WITH OR WITHOUT MEALS  latanoprost (XALATAN) 0.005 % ophthalmic solution Place 1 drop into both eyes nightly.  levoFLOXacin (LEVAQUIN) 500 mg tablet Take 1 tablet (500 mg total) by mouth daily.  lisinopriL (PRINIVIL,ZESTRIL) 40 mg tablet take 1 tablet by mouth every day in the morning  magnesium oxide (MAG-OX) 400 mg (241.3 mg magnesium) tablet Take 1 tablet (400 mg total) by mouth.  metoprolol succinate XL (TOPROL-XL) 50 mg 24 hr tablet Take 1 tablet (50 mg total) by mouth daily.  ondansetron (ZOFRAN-ODT) 8 mg disintegrating tablet Dissolve 1 tablet (8 mg total) by mouth every 12 (twelve) hours as needed for nausea or vomiting.  venetoclax (VENCLEXTA) 100 mg tablet Day 1: Take 1 tablet (100 mg total) by mouth for 1 dose. Day 2: Take 2 tablets (200 mg total) by mouth for 1 dose. Day 3 and beyond: Take 4 tablets (400 mg total) by mouth once daily. Take with food. Swallow whole, do not crush.  voriconazole (VFEND) 200 mg tablet Take 1 tablet (200 mg total) by mouth every 12 (twelve) hours. ALLERGIES:Patient has no known allergies.ROSNegative except for the above in interim history.VITALS:Temp:  [97.5 ?F (36.4 ?C)-97.9 ?F (36.6 ?C)] 97.9 ?F (36.6 ?C)Pulse:  [77-81] 81Resp:  [18] 18BP: (111-125)/(60-61) 125/61SpO2:  [100 %] 100 %Device (Oxygen Therapy): room airWt Readings from Last 3 Encounters: 07/08/22 112.5 kg 07/04/22 114.3 kg 07/01/22 113.4 kg Physical ExamECOG PS: 0Vitals reviewed. Constitutional:    No acute distress, Not ill-appearing. HENT:   Alopecia; Nose normal. Mucous membranes are moist and without lesions. Upper mid inner gum with resolving and fading sore. Oropharynx is clear. No oropharyngeal exudate or posterior oropharyngeal erythema. Eyes:    No scleral icterus. Conjunctivae normal. Cardiovascular:    Normal rate and regular rhythm. S1S2, no murmurs, rubs or gallops. Normal pulses. Pulmonary:    Pulmonary effort is normal. No respiratory distress. Normal breath sounds. No wheezing, rhonchi or rales. Abdominal:   Abdomen is flat. Bowel sounds are normal. There is no distension. Abdomen is soft. There is no abdominal tenderness. There is no guarding. No HSM Musculoskeletal:    Normal range of motion; no joint swelling; No edema to extremitiesSkin:   Skin is warm and dry. Flat erythema rash below and to the left of his Hickman catheter lumen caps.  Hickman site CDI without sign of infection. No rash or recent skin change elsewhere. Neurological:    No focal deficit present. Gait is steady. Alert and oriented to person, place, and time. Mental status is at baseline. Psychiatric:       Mood normal.  LABSComplete Blood CountLab Results Component Value Date  WBC 0.2 (LL) 07/08/2022  HGB 9.0 (L) 07/08/2022  HCT 29.00 (L) 07/08/2022  MCV 89.8 07/08/2022  PLT 72 (L) 07/08/2022 Chemistry:  Chemistry    Component Value Date/Time  NA 140 07/08/2022 0953  K 4.2 07/08/2022 0953  CL 109 (H) 07/08/2022 0953  CO2 22 07/08/2022 0953  BUN 11 07/08/2022 0953  CREATININE 1.06 07/08/2022 0953  GLU 95 07/08/2022 0953  PROT 7.1 07/08/2022 0953    Component Value Date/Time  CALCIUM 9.1 07/08/2022 0953  ALKPHOS 112 07/08/2022 0953  AST 21 07/08/2022 0953  ALT 14 07/08/2022 0953  BILITOT 1.1 07/08/2022 0953  ALBUMIN 4.3 07/08/2022 0953  ASSESSMENT & PLAN:Mr. Tippens is a 72 year old man with a history of depression, anxiety, iron deficiency anemia, CLL, CMML (diagnosed 12/2019) who developed mild pancytopenia in 01/2022 followed by severe anemia, iron deficiency and persistent leukopenia/neutropenia.  He underwent a bone marrow 04/07/22 in [facility] under the care of Dr. Marnette Burgess raising concern for disease evolving to AML.He returns to clinic today for labs and supportive care. He is being seen for intermittent GI upset he thinks is related to his intake, recent mouth sores upper mid inner lip and lateral tongue that have resolved, a flat rash below and to the left of his Hickman catheter.  CMP stable. He remains cytopenic. The plan is the following. 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  Treatment- Decitabine/venetoclax began C1D1 of Dec/Ven 06/16/22, today is day 22- Bone marrow biopsy between days 21 and 28-ordered for [place] guided biopsy. If blasts still in peripheral blood we may delay biopsy.  Monitoring- Biweekly labs with CBC w/ diff, CMP, TLS and DIC labs- TLS ppx: allopurinol 300 mg PO daily Supportive care- TLS ppx: allopurinol 300 mg PO daily, confirmed he is taking- Hb goal >=8, Plts >=20, irradiated blood products- Hickman flush 2x/week, dressing change weekly- Rinse mouth QID and prn while cytopenic- Trial Pepcid po daily for intermittent intake induced GI upset, possible gerd. - Educated to call for fever asap in setting of neutropenia and the need to come in asap. # Anti-microbial ProphylaxisHSV/VZV: Acylovir 400 mg PO bid (prior dose adjusted; eprescribed)Bacterial: Fluoroquinolone for ANC<500 --> Will decrease ppx to 500 mg daily confirmed he is takingFungal: broad azole for ANC<500 --> awaiting copay assist for voriconazole, message sent to OPS# Rash Flat erythema rash below and to the left of his Hickman catheter lumen caps that began four days ago. Trial OTC hydrocortisone to site bid. If rash becomes larger and or if he develops a rash elsewhere consideration must be given to allopurinol as the cause. Educated patient and his wife to call for the  above.  # CLL- 04/28/22 BM aspirate flow cytometry did not detect a monoclonal B cell population  Electronically Signed by Seward Speck, APRN, Jul 08, 2022

## 2022-07-15 NOTE — Telephone Encounter
Post call: Patient had [place] guided biopsy on 5/28. Spoke to patient, patient verbalized that he was doing well, no nausea, vomiting, no problem with eating and drinking. Patient had soreness over the biopsy site, this writer recommended patient to take Tylenol ATC and apply an ice pack at the biopsy for pain relief.  No other questions or concerns.

## 2022-07-15 NOTE — Progress Notes
Patient arrives ambulatory with his wife for labs poss and consideration for C2 Decitabine + Venetoclax (100mg  daily).RDL hickman dressing c/d/I, +BR/flush, labs drawn as ordered. Hgb 9.3, plts 300, and ANC 0.32.No transfusions needed.Continues on acyclovir, levaquin, and vori for prophylaxis. Patient reports feeling well aside from rash/bruise-like area underneath his hickman site, continues to use a steroid cream to the area. Bmbx site notable for a small bruise and dried blood. Energy level remains fair. No new sx/complaints. See flow-sheet for full assessment. Seen by Douglas Siva APRN, plan to delay C2 until Tues 6/4 when he plans to see Douglas [name] and review bmbx results from yesterday 5/28. Schedule adjusted so he will RTC Sat 6/1 for labs poss and then Tues 6/4 for treatment x5 days followed by supportive care Wed/Sat (check-out note made for additional supportive care appts).

## 2022-07-15 NOTE — Progress Notes
Diagnosis: CMML/MDS subtype Current treatment: Aza/Ven C1D1 4/30/24HISTORY OF PRESENT ILLNESS: Douglas [name] is a 72 year old man with a history of depression, anxiety, iron deficiency anemia, CMML (diagnosed 12/2019 at [facility] when he lived in [place]; TET2, ASXL1, [name]). He presented in 07/2021 to establish hematologic malignancies care given that his daughter lives in [place]. He resides in [place] and is under the care of Dr. Sofie Rower of [facility] in [place]. He established care with Dr. Mel Almond 05/14/21. He has been under observation for the CMML. Around February, his Hb was found to be 5 for which he received 2U of PRBC. He had had no viral syndrome prior. 3-4 weeks prior he had sustained a bad fall in which his LLE became significantly swollen. He was to have a [place] LLE which did not occur. There is a question of whether he could have bled into his leg. At the time he became progressively fatigued and SOB. He had a cardiac evaluation including a pharmacologic stress test which was reported to him as normal. He did not have endoscopies; last colonoscopy was in 2021. He was found to be iron deficient and his Hb responded to IV iron rising to 12.2 in 12/2020. A bone marrow biopsy and aspiration was repeat 05/16/21 and reported to be stable. 07/08/21 he saw Dr. Mendel Corning at [facility] who recommended deferring allogeneic SCT until the time of progression and HMA until the time of critical cytopenias. At baseline he is chronically tired, sleeps very poorly; has been recommended to have sleep test.He is not limited in his activities; walks once a day for 20 minutes.In [place] he was bicycling and walking up to 3-4X/day. Dr. 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 He was offered admission at [facility] and decided to come to [place] to be treated near his family. Review of CBC in care everywhere shows that WBC count trended down to 1.29, ANC to 0.31 with Hb 12.4 on the same date and platelets 121, 01/16/22.The following CBC on 04/07/22 showed WBC 1.4, ANC 0.75, Hb 5.9, MCV 101, Platelets 383 for which he was transfused 2U of PRBC on 04/09/22 and 1U of PRBC on 04/24/22.The anemia w/u on 04/07/22 showed ferritin 15, epo level 632, TSH 4.2, B12 1342, folate >20, retic 6.29%, LDH 257(ULN 246).The development of severe bicytopenia triggered the bone marrow biopsy and aspiration 04/28/22.He was subsequently started on venofer and had 5-6 infusions of 200 mg. INTERIM HISTORY:Douglas [name] returns to clinic for routine evaluation accompanied by his wife. Appetite ok, no n/v/c with this cycle. Had one day of diarrhea. One lesion on tongue--not painful, other ulcers resolved. No fever or chills. He was seen for a rash on his chest on 07/08/22 that began 07/04/22 around hickman catheter dressing, tried topical steroids which did not help; rash is not bothersome-not itchy or painful. No other s/s of bleeding, no blood in stool.Energy better-took a short hike yesterday after his [place] guided BM bx, site is sore but not as sore as last time. Reviewed medication list, allergies, PMH, PSH, SH, FHReview of Systems --negative aside from those listed aboveConstitutional: Negative.  HENT: Negative.  Eyes: Negative.  Respiratory: Negative.  Cardiovascular: Negative.  Gastrointestinal: Negative.  Except for aboveGenitourinary: Negative.  Musculoskeletal: Negative.  Skin: see aboveNeurological: Negative.  Endo/Heme/Allergies: negativePsychiatric/Behavioral: Negative.  Physical ExamECOG PS: 1Vitals reviewed. Constitutional:  No acute distress, Not ill-appearing. HENT: Normocephalic; Nose normal. Mucous membranes are moist and without lesions. Oropharynx is clear. No oropharyngeal exudate or posterior oropharyngeal erythema. Left tongue lateral aspect brown lesionEyes:    No scleral icterus. Conjunctivae normal. Cardiovascular:    Normal rate and regular rhythm. S1S2, no murmurs, rubs or gallops. Normal pulses. Pulmonary:    Pulmonary effort is normal. No respiratory distress. Normal breath sounds. No wheezing, rhonchi or rales. Abdominal:   Abdomen is flat. Bowel sounds are normal. There is no distension. Abdomen is soft. There is no abdominal tenderness. There is no guarding. No HSM Musculoskeletal:    Normal range of motion; no joint swelling; No edema to extremitiesSkin:   Skin is warm and dry. Skin is pale. RCW with patchy flat nonblanchable purple/erythema-petechial   Comments: RCW Hickman catheter site without erythema, edema, tenderness, drainage. Dressing clean, dry, intactNeurological:    No focal deficit present. Gait is steady. Alert and oriented to person, place, and time. Mental status is at baseline. Psychiatric:       Mood normal. ASSESSMENT & PLAN:Douglas [name] is a 72 year old man with a history of depression, anxiety, iron deficiency anemia, CLL, CMML (diagnosed 12/2019) who developed mild pancytopenia in 01/2022 followed by severe anemia with Hb 5 iso of iron deficiency and persistent leukopenia/neutropenia 04/07/22 now with marrow done in [facility] under the care of Dr. Marnette Burgess raising concern for disease evolving to AML.He returns to clinic today for labs and supportive care. He feels well and offers no complaints. ANC remains low but trending up. 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 guidance with sedation (given that slides are not available and one month has elapsed)             Send NPM1 PCR, AML/MDS FISH, CCS heme panel in addition to [facility].- FLT3-TKD/ITD PCR from [facility] negative for ITD and D835 mutations- Given he now has peripheral blasts, NPM1 PCR and MDS/AML rapid panel ordered from [facility] on 05/30/22--NPM1 PCR negative; rapid panel revealed: One variant of known pathogenic significance [in RUNX1] is detected by this rapid NGS assessment.  Treatment- Decitabine/venetoclax began C1D1 of Dec/Ven 06/16/22, today is day 29, taking Venetoclax 100 mg as he is on voriconazoleResponse assessment:- Bone marrow biopsy between days 21 and 28-ordered for [place] guided biopsy. If blasts still in peripheral blood we may delay biopsy. [place] guided bone marrow biopsy done on 07/14/22, results pending. ANC remains low though is trending up. Transplant planning: seen by [name] on 06/24/22;  CMV IgG sent on 06/17/22 (negative), HLA typing and Abs sent on 05/30/22; has another appt on 6/10/24Monitoring- Biweekly labs with CBC w/ diff, CMP, TLS and DIC labs--no evidence of TLS or DIC with C1- TLS ppx: allopurinol 300 mg PO daily--discontinued today Supportive care- TLS ppx: allopurinol 300 mg PO daily, told him he can stop allopurinol today 07/15/22- Hb goal >=8, Plts >=20, irradiated blood products--did not require platelet transfusion this cycle, required 5U PRBCs (see synopsis)- Hickman flush 2x/week, dressing change weekly- Rinse mouth QID and prn while cytopenic--has one non-painful tongue lesion -Anemia--has appt for endoscopy/colonoscopy on 08/25/22 pending blood counts (platelets should be at least 50 and ANC should be at least 1.0)# Anti-microbial ProphylaxisHSV/VZV: Acylovir 400 mg PO bid (prior dose adjusted; eprescribed)Bacterial: Fluoroquinolone for ANC<500 --> continue levaquinFungal: broad azole for ANC<500 --> continue voriconazole 200 mg bid# Rash to RCW, looks like ecchmosis and petechial lesions though platelet nadir was 72, will monitor  #  CLL- 04/28/22 BM aspirate flow cytometry did not detect a monoclonal B cell population He will see Dr. Sherrell Puller on 07/21/22 to review biopsy results and start C2 pending ANC recovery. Reviewed he may be able to stop venetoclax in next day or two pending biopsy results. Reviewed plan. Reviewed reportable s/s and encouraged him to call with any new/worsening symptoms, questions or concerns. They expressed good understanding of information provided.More than 30 minutes was spent by me on this encounter; including face to face time with patient, review of chart, and care coordination.This does not include any time spent performing a procedural service.Electronically Signed by Rozanna Box, APRN, Jul 15, 2022

## 2022-07-16 ENCOUNTER — Encounter: Admit: 2022-07-16 | Payer: PRIVATE HEALTH INSURANCE

## 2022-07-16 ENCOUNTER — Ambulatory Visit: Admit: 2022-07-16 | Payer: MEDICARE

## 2022-07-16 LAB — MDS PLUS PANEL
BKR CD10+CD33+: 0 %
BKR CD10+CD34+: 0 %
BKR CD11B+CD16+: 9.5 % — ABNORMAL LOW (ref 4.00–6.00)
BKR CD13+CD11B+: 29.2 % — ABNORMAL LOW (ref 13.2–17.1)
BKR CD14+CD33+: 41.3 %
BKR CD14-CD64+: 7.5 %
BKR CD19+: 3.5 % — ABNORMAL HIGH (ref 9.6–12.3)
BKR CD19+CD38+: 1.3 %
BKR CD19+KAPPA+ MONO GATE: 0 %
BKR CD19+KAPPA+ MYELOID GATE: 0 %
BKR CD19+KAPPA+: 50.4 % — ABNORMAL HIGH (ref 0.86–1.12)
BKR CD19+LAMBDA+ MONO GATE: 0 %
BKR CD19+LAMBDA+ MYELOID GATE: 0.1 %
BKR CD19+LAMBDA+: 50 % — ABNORMAL HIGH (ref 122–241)
BKR CD3+CD16/56+: 2.9 %
BKR CD3+CD16/56-: 21.9 %
BKR CD3+CD4+ MONO GATE: 16.6 %
BKR CD3+CD4+: 12.4 %
BKR CD3+CD4+CD8+: 0.4 %
BKR CD3+CD5+ MONO GATE: 30.1 %
BKR CD3+CD5+ MYELOID GATE: 0.3 %
BKR CD3+CD5+: 24 %
BKR CD3+CD8+ MONO GATE: 11.4 %
BKR CD3+CD8+: 9.2 %
BKR CD3-CD16/56+: 23.7 %
BKR CD3-CD4+: 30.1 %
BKR CD33+ MONO GATE: 53.8 %
BKR CD33+: 12.8 %
BKR CD33+CD10+: 0 %
BKR CD33+CD34+: 5.8 % (ref 2.7–7.3)
BKR CD33+HLADR-: 20.9 %
BKR CD34+: 27.1 %
BKR CD34+CD10+: 0 % (ref 80.0–100.0)
BKR CD34+CD117+ MONO GATE: 0 % — ABNORMAL LOW (ref 31.0–36.0)
BKR CD34+CD117+: 6 %
BKR CD34+CD33+ MONO GATE: 0 %
BKR CD34+CD64+ MONO GATE: 1.3 %
BKR CD34+CD64+: 0.3 % (ref 3.3–5.3)
BKR CD34+HLADR+: 4 %
BKR CD4/CD8 RATIO: 1.35 mg/dL (ref 194–448)
BKR CD45+ MONOCYTE GATE: 0.6 x1000/ÂµL — CL (ref 4.0–11.0)
BKR CD45+ MYELOID GATE: 0 x 1000/ÂµL (ref 0.00–1.00)
BKR CD45+CD10+CD33-: 0.4 %
BKR CD45+CD10-CD33+: 4.1 %
BKR CD45+CD11B+CD13+: 42 %
BKR CD45+CD14+CD64+: 0.4 %
BKR CD45+CD16+CD11B+: 16.9 %
BKR CD45+CD16.56+CD3-: 0.6 %
BKR CD45+CD33+HLADR+: 5.9 %
BKR CD45+CD34+CD10+: 0.3 %
BKR CD45+CD34+CD117+: 1.8 %
BKR CD45+CD34+CD33+: 0.3 %
BKR CD45+CD34+CD7+: 0.4 %
BKR CD45+CD56+CD38-: 0 %
BKR CD56+: 2.9 %
BKR CD56+CD38+: 11.4 %
BKR CD64+: 50 % — ABNORMAL HIGH (ref 11.0–15.0)
BKR CD64+CD14+ MONO GATE: 42.5 % — ABNORMAL LOW (ref 38.50–50.00)
BKR CD64+CD14+: 0 % (ref 136–144)
BKR CD7+CD117+: 2.5 % (ref 2.2–4.5)
BKR CD7+CD117- MONO GATE: 54.8 %
BKR CD7+CD117-: 38.7 %
BKR HLADR+CD33+: 21.2 % (ref 27.0–33.0)

## 2022-07-16 LAB — FLOW CYTOMETRY - LEUKEMIA/LYMPHOMA/NEOPLASIA, BONE MARROW

## 2022-07-17 ENCOUNTER — Ambulatory Visit: Admit: 2022-07-17 | Payer: MEDICARE

## 2022-07-18 ENCOUNTER — Inpatient Hospital Stay: Admit: 2022-07-18 | Discharge: 2022-07-18 | Payer: MEDICARE

## 2022-07-18 DIAGNOSIS — C931 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: Secondary | ICD-10-CM

## 2022-07-18 LAB — COMPREHENSIVE METABOLIC PANEL
BKR A/G RATIO: 1.4 (ref 1.0–2.2)
BKR ALANINE AMINOTRANSFERASE (ALT): 16 U/L (ref 9–59)
BKR ALBUMIN: 4.1 g/dL (ref 3.6–5.1)
BKR ALKALINE PHOSPHATASE: 114 U/L (ref 9–122)
BKR ANION GAP: 11 % (ref 7–17)
BKR ASPARTATE AMINOTRANSFERASE (AST): 24 U/L (ref 10–35)
BKR AST/ALT RATIO: 1.5
BKR BILIRUBIN TOTAL: 0.5 mg/dL (ref <=1.2)
BKR BLOOD UREA NITROGEN: 12 mg/dL (ref 8–23)
BKR BUN / CREAT RATIO: 11.5 (ref 8.0–23.0)
BKR CALCIUM: 8.9 mg/dL (ref 8.8–10.2)
BKR CHLORIDE: 107 mmol/L (ref 98–107)
BKR CO2: 22 mmol/L (ref 20–30)
BKR CREATININE: 1.04 mg/dL (ref 0.40–1.30)
BKR EGFR, CREATININE (CKD-EPI 2021): >60 mL/min/{1.73_m2} (ref >=60)
BKR GLOBULIN: 3 g/dL (ref 2.0–3.9)
BKR GLUCOSE: 110 mg/dL — ABNORMAL HIGH (ref 70–100)
BKR POTASSIUM: 4.1 mmol/L (ref 3.3–5.3)
BKR PROTEIN TOTAL: 7.1 g/dL (ref 5.9–8.3)
BKR SODIUM: 140 mmol/L (ref 136–144)

## 2022-07-18 LAB — CBC WITH AUTO DIFFERENTIAL
BKR WAM ABSOLUTE NRBC (2 DEC): 0 x 1000/ÂµL (ref 0.00–1.00)
BKR WAM HEMATOCRIT (2 DEC): 29.4 % — ABNORMAL LOW (ref 38.50–50.00)
BKR WAM HEMOGLOBIN: 9 g/dL — ABNORMAL LOW (ref 13.2–17.1)
BKR WAM MCH (PG): 26.9 pg — ABNORMAL LOW (ref 27.0–33.0)
BKR WAM MCHC: 30.6 g/dL — ABNORMAL LOW (ref 31.0–36.0)
BKR WAM MCV: 88 fL (ref 80.0–100.0)
BKR WAM MPV: 12.1 fL — ABNORMAL HIGH (ref 8.0–12.0)
BKR WAM NUCLEATED RED BLOOD CELLS: 0 % (ref 0.0–1.0)
BKR WAM PLATELETS: 398 x1000/ÂµL (ref 150–420)
BKR WAM RDW-CV: 17.9 % — ABNORMAL HIGH (ref 11.0–15.0)
BKR WAM RED BLOOD CELL COUNT.: 3.34 M/ÂµL — ABNORMAL LOW (ref 4.00–6.00)
BKR WAM WHITE BLOOD CELL COUNT: 1 x1000/ÂµL — ABNORMAL LOW (ref 4.0–11.0)

## 2022-07-18 LAB — MANUAL DIFFERENTIAL
BKR WAM BANDS (DIFF) (1 DEC): 8 % (ref 0.0–10.0)
BKR WAM BASOPHIL - ABS (DIFF) 2 DEC: 0 x 1000/ÂµL (ref 0.00–1.00)
BKR WAM BASOPHILS (DIFF): 0 % (ref 0.0–1.4)
BKR WAM EOSINOPHILS (DIFF) 2 DEC: 0 x 1000/ÂµL (ref 0.00–1.00)
BKR WAM EOSINOPHILS (DIFF): 0 % — ABNORMAL HIGH (ref 0.0–5.0)
BKR WAM LYMPHOCYTE - ABS (DIFF) 2 DEC: 0.44 x 1000/ÂµL — ABNORMAL LOW (ref 0.60–3.70)
BKR WAM LYMPHOCYTES (DIFF): 44 % (ref 17.0–50.0)
BKR WAM MONOCYTE - ABS (DIFF) 2 DEC: 0.12 x 1000/ÂµL (ref 0.00–1.00)
BKR WAM MONOCYTES (DIFF): 12 % (ref 4.0–12.0)
BKR WAM NEUTROPHILS (DIFF): 36 % — ABNORMAL LOW (ref 39.0–72.0)
BKR WAM NEUTROPHILS - ABS (DIFF) 2 DEC: 0.44 x 1000/ÂµL — ABNORMAL LOW (ref 2.00–7.60)

## 2022-07-18 NOTE — Progress Notes
Patient arrives for labs/possible transfusion. VSS, offers no new complaints. Denies any signs and symptoms of active infection or bleeding. Labs obtained via R DL Hickman and reviewed. WBC 1.0ANC pending at Hgb 9.0Plts 398No indication for transfusion today. Hickman catheter flushed and dressing/claves changed per protocol. Patient discharged ambulatory in stable condition. Has follow up in place. Declines AVS

## 2022-07-19 ENCOUNTER — Ambulatory Visit: Admit: 2022-07-19 | Payer: MEDICARE

## 2022-07-20 LAB — NPM1 MUTATION BY PCR AND FRAG. ANALYSIS     (YH)
BKR NPM1 MUTATION: NOT DETECTED
BKR PERCENTAGE NPM1 MUTATION: <0.01 % (ref <0.01)

## 2022-07-21 ENCOUNTER — Ambulatory Visit: Admit: 2022-07-21 | Payer: MEDICARE | Attending: Hematology & Oncology

## 2022-07-21 ENCOUNTER — Encounter: Admit: 2022-07-21 | Payer: PRIVATE HEALTH INSURANCE

## 2022-07-21 ENCOUNTER — Inpatient Hospital Stay: Admit: 2022-07-21 | Discharge: 2022-07-21 | Payer: MEDICARE

## 2022-07-21 DIAGNOSIS — C931 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: Secondary | ICD-10-CM

## 2022-07-21 DIAGNOSIS — Z803 Family history of malignant neoplasm of breast: Secondary | ICD-10-CM

## 2022-07-21 DIAGNOSIS — D709 Neutropenia, unspecified: Secondary | ICD-10-CM

## 2022-07-21 DIAGNOSIS — C9200 Acute myeloblastic leukemia, not having achieved remission: Secondary | ICD-10-CM

## 2022-07-21 DIAGNOSIS — Z5111 Encounter for antineoplastic chemotherapy: Secondary | ICD-10-CM

## 2022-07-21 DIAGNOSIS — I10 Essential (primary) hypertension: Secondary | ICD-10-CM

## 2022-07-21 DIAGNOSIS — E785 Hyperlipidemia, unspecified: Secondary | ICD-10-CM

## 2022-07-21 DIAGNOSIS — F419 Anxiety disorder, unspecified: Secondary | ICD-10-CM

## 2022-07-21 DIAGNOSIS — Z85828 Personal history of other malignant neoplasm of skin: Secondary | ICD-10-CM

## 2022-07-21 DIAGNOSIS — Z79631 Long term (current) use of antimetabolite agent: Secondary | ICD-10-CM

## 2022-07-21 DIAGNOSIS — Z95828 Presence of other vascular implants and grafts: Secondary | ICD-10-CM

## 2022-07-21 DIAGNOSIS — Z8042 Family history of malignant neoplasm of prostate: Secondary | ICD-10-CM

## 2022-07-21 DIAGNOSIS — D509 Iron deficiency anemia, unspecified: Secondary | ICD-10-CM

## 2022-07-21 LAB — COMPREHENSIVE METABOLIC PANEL
BKR A/G RATIO: 1.3 (ref 1.0–2.2)
BKR ALANINE AMINOTRANSFERASE (ALT): 15 U/L (ref 9–59)
BKR ALBUMIN: 4.1 g/dL (ref 3.6–5.1)
BKR ALKALINE PHOSPHATASE: 112 U/L (ref 9–122)
BKR ANION GAP: 10 g/dL — ABNORMAL LOW (ref 7–17)
BKR ASPARTATE AMINOTRANSFERASE (AST): 24 U/L — ABNORMAL LOW (ref 10–35)
BKR AST/ALT RATIO: 1.6
BKR BILIRUBIN TOTAL: 0.4 mg/dL (ref <=1.2)
BKR BLOOD UREA NITROGEN: 14 mg/dL (ref 8–23)
BKR BUN / CREAT RATIO: 14.3 (ref 8.0–23.0)
BKR CALCIUM: 9.1 mg/dL (ref 8.8–10.2)
BKR CHLORIDE: 106 mmol/L (ref 98–107)
BKR CO2: 22 mmol/L (ref 20–30)
BKR CREATININE: 0.98 mg/dL — ABNORMAL HIGH (ref 0.40–1.30)
BKR EGFR, CREATININE (CKD-EPI 2021): >60 mL/min/{1.73_m2} (ref >=60)
BKR GLOBULIN: 3.2 g/dL (ref 2.0–3.9)
BKR GLUCOSE: 107 mg/dL — ABNORMAL HIGH (ref 70–100)
BKR POTASSIUM: 4.3 mmol/L — ABNORMAL LOW (ref 3.3–5.3)
BKR PROTEIN TOTAL: 7.3 g/dL (ref 5.9–8.3)
BKR SODIUM: 138 mmol/L — ABNORMAL LOW (ref 136–144)

## 2022-07-21 LAB — CBC WITH AUTO DIFFERENTIAL
BKR WAM ABSOLUTE NRBC (2 DEC): 0 x 1000/ÂµL (ref 0.00–1.00)
BKR WAM ANALYZER ANC: 0.33 x 1000/ÂµL — ABNORMAL LOW (ref 2.00–7.60)
BKR WAM HEMATOCRIT (2 DEC): 30.5 % — ABNORMAL LOW (ref 38.50–50.00)
BKR WAM HEMOGLOBIN: 9.4 g/dL — ABNORMAL LOW (ref 13.2–17.1)
BKR WAM MCH (PG): 27.2 pg (ref 27.0–33.0)
BKR WAM MCHC: 30.8 g/dL — ABNORMAL LOW (ref 31.0–36.0)
BKR WAM MCV: 88.2 fL (ref 80.0–100.0)
BKR WAM MPV: 12.1 fL — ABNORMAL HIGH (ref 8.0–12.0)
BKR WAM NUCLEATED RED BLOOD CELLS: 0 % (ref 0.0–1.0)
BKR WAM PLATELETS: 416 x1000/ÂµL (ref 150–420)
BKR WAM RDW-CV: 18.4 % — ABNORMAL HIGH (ref 11.0–15.0)
BKR WAM RED BLOOD CELL COUNT.: 3.46 M/ÂµL — ABNORMAL LOW (ref 4.00–6.00)
BKR WAM WHITE BLOOD CELL COUNT: 0.7 x1000/ÂµL — CL (ref 4.0–11.0)

## 2022-07-21 LAB — PHOSPHORUS     (BH GH L LMW YH): BKR PHOSPHORUS: 2.5 mg/dL (ref 2.2–4.5)

## 2022-07-21 LAB — LACTATE DEHYDROGENASE: BKR LACTATE DEHYDROGENASE: 354 U/L — ABNORMAL HIGH (ref 122–241)

## 2022-07-21 LAB — URIC ACID: BKR URIC ACID: 3.9 mg/dL (ref 2.7–7.3)

## 2022-07-21 MED ORDER — DECITABINE INFUSION
Freq: Once | INTRAVENOUS | Status: CP
Start: 2022-07-21 — End: ?
  Administered 2022-07-21: 19:00:00 100.000 mL/h via INTRAVENOUS

## 2022-07-21 MED ORDER — VENETOCLAX 100 MG TABLET
100 mg | ORAL_TABLET | Freq: Every day | ORAL | 1 refills | 30.00 days | Status: AC
Start: 2022-07-21 — End: 2022-07-23

## 2022-07-21 MED ORDER — DECITABINE INFUSION
Freq: Once | INTRAVENOUS | Status: CP
Start: 2022-07-21 — End: ?
  Administered 2022-07-22: 15:00:00 100.000 mL/h via INTRAVENOUS

## 2022-07-22 ENCOUNTER — Inpatient Hospital Stay: Admit: 2022-07-22 | Discharge: 2022-07-22 | Payer: MEDICARE

## 2022-07-22 DIAGNOSIS — Z79624 Long term (current) use of inhibitors of nucleotide synthesis: Secondary | ICD-10-CM

## 2022-07-22 DIAGNOSIS — Z79899 Other long term (current) drug therapy: Secondary | ICD-10-CM

## 2022-07-22 DIAGNOSIS — Z5111 Encounter for antineoplastic chemotherapy: Secondary | ICD-10-CM

## 2022-07-22 DIAGNOSIS — Z79631 Long term (current) use of antimetabolite agent: Secondary | ICD-10-CM

## 2022-07-22 DIAGNOSIS — C9200 Acute myeloblastic leukemia, not having achieved remission: Secondary | ICD-10-CM

## 2022-07-22 NOTE — Progress Notes
Patient arrives for ?C2D1 Decitabine/appointment with [facility]/labs poss. VSS, states he is doing and feeling well today. Denies any signs and symptoms of active infection or bleeding. Verifies he has Venetoclex left over from previous cycle as he is dose reduced secondary to concurrent antifungal coverage. Prior to appointment, Dr Sherrell Puller verified patient woul be treated with next cycle despite counts as he has residual disease and it was ok to pre release Decitabine as she has 4pm appointment with patient. Labs obtained via R DL Hickman and reviewed. No indication for transfusion or repletion today. He remain neutropenic with ANC 0.33. Decitabine administered over 1 hour per orders. Hickman catheter flushed per protocol. Discharged to exam room for Dr Sherrell Puller appointment in stable condition and will return tomorrow for D2

## 2022-07-22 NOTE — Progress Notes
Patient arrives for C2D2 Decitabine. VSS, states he is doing and feeling well today. Denies any changes or signs and symptoms of active infection or bleeding. Per his report, will now only be taking his Venetoclax D1-14. Decitabine administered via R DL Hickman with +BBR noted prior to and immediately following administration. Hickman flushed per protocol at completion of infusion. Will return tomorrow for D3

## 2022-07-23 ENCOUNTER — Inpatient Hospital Stay: Admit: 2022-07-23 | Discharge: 2022-07-23 | Payer: MEDICARE

## 2022-07-23 DIAGNOSIS — C9200 Acute myeloblastic leukemia, not having achieved remission: Secondary | ICD-10-CM

## 2022-07-23 DIAGNOSIS — Z5111 Encounter for antineoplastic chemotherapy: Secondary | ICD-10-CM

## 2022-07-23 DIAGNOSIS — Z79899 Other long term (current) drug therapy: Secondary | ICD-10-CM

## 2022-07-23 DIAGNOSIS — Z79631 Long term (current) use of antimetabolite agent: Secondary | ICD-10-CM

## 2022-07-23 DIAGNOSIS — Z79624 Long term (current) use of inhibitors of nucleotide synthesis: Secondary | ICD-10-CM

## 2022-07-23 MED ORDER — DECITABINE INFUSION
Freq: Once | INTRAVENOUS | Status: CP
Start: 2022-07-23 — End: ?
  Administered 2022-07-24: 18:00:00 100.000 mL/h via INTRAVENOUS

## 2022-07-23 MED ORDER — SODIUM CHLORIDE 0.9 % INTRAVENOUS SOLUTION
INTRAVENOUS | Status: DC | PRN
Start: 2022-07-23 — End: 2022-07-28

## 2022-07-23 MED ORDER — SODIUM CHLORIDE 0.9 % (FLUSH) INJECTION SYRINGE
0.9 % | Status: DC | PRN
Start: 2022-07-23 — End: 2022-07-28

## 2022-07-23 MED ORDER — DECITABINE INFUSION
Freq: Once | INTRAVENOUS | Status: CP
Start: 2022-07-23 — End: ?
  Administered 2022-07-23: 15:00:00 100.000 mL/h via INTRAVENOUS

## 2022-07-23 MED FILL — VORICONAZOLE 200 MG TABLET: 200 mg | ORAL | 15 days supply | Qty: 30 | Fill #2 | Status: CP

## 2022-07-23 NOTE — Progress Notes
Taiveon arrives ambulatory for C2D3 Decitabine + Ven (100mg  D1-14)Feeling well.  Reports firm stool this AM, will pick up senna and miralax and use prn.  Otherwise no new complaints to report.Confirmed pt has enough venetoclax.RDL Hickman dsg C/D/I, decitabine administered over 1h, well tolerated with BBR at completion.Discharged ambulatory in stable condition.  Will RTC tomorrow for day 4

## 2022-07-24 ENCOUNTER — Inpatient Hospital Stay: Admit: 2022-07-24 | Discharge: 2022-07-24 | Payer: MEDICARE

## 2022-07-24 ENCOUNTER — Telehealth: Admit: 2022-07-24 | Payer: PRIVATE HEALTH INSURANCE | Attending: Medical Oncology

## 2022-07-24 DIAGNOSIS — C9200 Acute myeloblastic leukemia, not having achieved remission: Secondary | ICD-10-CM

## 2022-07-24 DIAGNOSIS — Z5111 Encounter for antineoplastic chemotherapy: Secondary | ICD-10-CM

## 2022-07-24 DIAGNOSIS — Z79631 Long term (current) use of antimetabolite agent: Secondary | ICD-10-CM

## 2022-07-24 DIAGNOSIS — Z79899 Other long term (current) drug therapy: Secondary | ICD-10-CM

## 2022-07-24 DIAGNOSIS — C931 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: Secondary | ICD-10-CM

## 2022-07-24 DIAGNOSIS — Z7982 Long term (current) use of aspirin: Secondary | ICD-10-CM

## 2022-07-24 DIAGNOSIS — Z79624 Long term (current) use of inhibitors of nucleotide synthesis: Secondary | ICD-10-CM

## 2022-07-24 LAB — URIC ACID: BKR URIC ACID: 4.3 mg/dL (ref 2.7–7.3)

## 2022-07-24 LAB — COMPREHENSIVE METABOLIC PANEL
BKR A/G RATIO: 1.2 (ref 1.0–2.2)
BKR ALANINE AMINOTRANSFERASE (ALT): 7 U/L — ABNORMAL LOW (ref 9–59)
BKR ALBUMIN: 4.2 g/dL (ref 3.6–5.1)
BKR ALKALINE PHOSPHATASE: 112 U/L (ref 9–122)
BKR ANION GAP: 9 g/dL — ABNORMAL LOW (ref 7–17)
BKR BILIRUBIN TOTAL: 0.6 mg/dL (ref <=1.2)
BKR BLOOD UREA NITROGEN: 13 mg/dL (ref 8–23)
BKR BUN / CREAT RATIO: 12.6 % (ref 8.0–23.0)
BKR CALCIUM: 8.9 mg/dL (ref 8.8–10.2)
BKR CHLORIDE: 108 mmol/L — ABNORMAL HIGH (ref 98–107)
BKR CO2: 22 mmol/L (ref 20–30)
BKR CREATININE: 1.03 mg/dL — ABNORMAL HIGH (ref 0.40–1.30)
BKR EGFR, CREATININE (CKD-EPI 2021): >60 mL/min/{1.73_m2} (ref >=60)
BKR GLOBULIN: 3.4 g/dL — ABNORMAL LOW (ref 2.0–3.9)
BKR GLUCOSE: 89 mg/dL (ref 70–100)
BKR POTASSIUM: 30.9 % — ABNORMAL LOW (ref 38.50–50.00)
BKR PROTEIN TOTAL: 7.6 g/dL (ref 5.9–8.3)
BKR SODIUM: 139 mmol/L (ref 136–144)

## 2022-07-24 LAB — CBC WITH AUTO DIFFERENTIAL
BKR WAM ABSOLUTE IMMATURE GRANULOCYTES.: 0.01 x 1000/ÂµL (ref 0.00–0.30)
BKR WAM ABSOLUTE LYMPHOCYTE COUNT.: 0.43 x 1000/ÂµL — ABNORMAL LOW (ref 0.60–3.70)
BKR WAM ABSOLUTE NRBC (2 DEC): 0 x 1000/ÂµL (ref 0.00–1.00)
BKR WAM ANALYZER ANC: 0.64 x 1000/ÂµL — ABNORMAL LOW (ref 2.00–7.60)
BKR WAM BASOPHIL ABSOLUTE COUNT.: 0 x 1000/ÂµL (ref 0.00–1.00)
BKR WAM BASOPHILS: 0 % (ref 0.0–1.4)
BKR WAM EOSINOPHIL ABSOLUTE COUNT.: 0 x 1000/ÂµL (ref 0.00–1.00)
BKR WAM EOSINOPHILS: 0 % (ref 0.0–5.0)
BKR WAM HEMATOCRIT (2 DEC): 30.9 % — ABNORMAL LOW (ref 38.50–50.00)
BKR WAM HEMOGLOBIN: 9.4 g/dL — ABNORMAL LOW (ref 13.2–17.1)
BKR WAM IMMATURE GRANULOCYTES: 0.8 % (ref 0.0–1.0)
BKR WAM LYMPHOCYTES: 36.4 % (ref 17.0–50.0)
BKR WAM MCH (PG): 26.7 pg — ABNORMAL LOW (ref 27.0–33.0)
BKR WAM MCHC: 30.4 g/dL — ABNORMAL LOW (ref 31.0–36.0)
BKR WAM MCV: 87.8 fL — ABNORMAL HIGH (ref 80.0–100.0)
BKR WAM MONOCYTE ABSOLUTE COUNT.: 0.1 x 1000/ÂµL (ref 0.00–1.00)
BKR WAM MONOCYTES: 8.5 % (ref 4.0–12.0)
BKR WAM MPV: 12.8 fL — ABNORMAL HIGH (ref 8.0–12.0)
BKR WAM NEUTROPHILS: 54.3 % (ref 39.0–72.0)
BKR WAM NUCLEATED RED BLOOD CELLS: 0 % (ref 0.0–1.0)
BKR WAM PLATELETS: 347 x1000/ÂµL (ref 150–420)
BKR WAM RDW-CV: 18.7 % — ABNORMAL HIGH (ref 11.0–15.0)
BKR WAM RED BLOOD CELL COUNT.: 3.52 M/ÂµL — ABNORMAL LOW (ref 4.00–6.00)
BKR WAM WHITE BLOOD CELL COUNT: 1.2 x1000/ÂµL — ABNORMAL LOW (ref 4.0–11.0)

## 2022-07-24 LAB — PHOSPHORUS     (BH GH L LMW YH): BKR PHOSPHORUS: 2.9 mg/dL (ref 2.2–4.5)

## 2022-07-24 MED ORDER — DECITABINE INFUSION
Freq: Once | INTRAVENOUS | Status: CP
Start: 2022-07-24 — End: ?
  Administered 2022-07-25: 15:00:00 100.000 mL/h via INTRAVENOUS

## 2022-07-24 NOTE — Telephone Encounter
[facility] reports cancellation of LDH due to hemolyzation and potassium and AST within CMP

## 2022-07-24 NOTE — Progress Notes
Douglas [name] returns ambulatory for C2D4 Decitabine + Ven (100mg  D1-14)Feeling well.  Reports he has started senna with good effect. No new complaints to report.RDL Hickman dsg C/D/I, labs obtained and reviewed-Hgb: 9.4; Plts: 347; ANC: 0.64No interventions based on lab results.  Decitabine administered over 1h, well tolerated with BBR at completion.Discharged ambulatory in stable condition.  Will RTC tomorrow for day 5 and dsg change.

## 2022-07-24 NOTE — Telephone Encounter
Noted LDH hemolyzed

## 2022-07-25 ENCOUNTER — Inpatient Hospital Stay: Admit: 2022-07-25 | Discharge: 2022-07-25 | Payer: MEDICARE

## 2022-07-25 DIAGNOSIS — C9200 Acute myeloblastic leukemia, not having achieved remission: Secondary | ICD-10-CM

## 2022-07-25 DIAGNOSIS — Z79631 Long term (current) use of antimetabolite agent: Secondary | ICD-10-CM

## 2022-07-25 DIAGNOSIS — Z5111 Encounter for antineoplastic chemotherapy: Secondary | ICD-10-CM

## 2022-07-25 LAB — FISH (CYTOGENETICS) (YMG)

## 2022-07-25 NOTE — Progress Notes
C2 Day 5 Decitabine due today. Labs drawn on day 4. Dressng change also due today.VSS. See focused reassessment. Mr Benally reports no new problems. Brisk blood return from both lumens . Decitabiine infused over 1 hour, brisk blood return at completion. He returns to [facility] 6/10 for appt with Dr Lenard Forth and then on 6/12.

## 2022-07-26 ENCOUNTER — Ambulatory Visit: Admit: 2022-07-26 | Payer: MEDICARE

## 2022-07-27 ENCOUNTER — Ambulatory Visit: Admit: 2022-07-27 | Payer: MEDICARE | Attending: Medical Oncology

## 2022-07-27 ENCOUNTER — Encounter: Admit: 2022-07-27 | Payer: PRIVATE HEALTH INSURANCE | Attending: Medical Oncology

## 2022-07-27 VITALS — BP 122/71 | HR 74 | Temp 98.00000°F | Resp 19 | Ht 74.0 in | Wt 246.5 lb

## 2022-07-27 DIAGNOSIS — C4491 Basal cell carcinoma of skin, unspecified: Secondary | ICD-10-CM

## 2022-07-27 DIAGNOSIS — N400 Enlarged prostate without lower urinary tract symptoms: Secondary | ICD-10-CM

## 2022-07-27 DIAGNOSIS — R0609 Other forms of dyspnea: Secondary | ICD-10-CM

## 2022-07-27 DIAGNOSIS — F32A Depression: Secondary | ICD-10-CM

## 2022-07-27 DIAGNOSIS — C931 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: Secondary | ICD-10-CM

## 2022-07-27 DIAGNOSIS — I119 Hypertensive heart disease without heart failure: Secondary | ICD-10-CM

## 2022-07-27 DIAGNOSIS — C9110 Chronic lymphocytic leukemia of B-cell type not having achieved remission: Secondary | ICD-10-CM

## 2022-07-27 DIAGNOSIS — H409 Unspecified glaucoma: Secondary | ICD-10-CM

## 2022-07-27 DIAGNOSIS — Z8249 Family history of ischemic heart disease and other diseases of the circulatory system: Secondary | ICD-10-CM

## 2022-07-27 DIAGNOSIS — I10 Essential (primary) hypertension: Secondary | ICD-10-CM

## 2022-07-27 DIAGNOSIS — E782 Mixed hyperlipidemia: Secondary | ICD-10-CM

## 2022-07-27 DIAGNOSIS — F419 Anxiety disorder, unspecified: Secondary | ICD-10-CM

## 2022-07-27 DIAGNOSIS — Z01818 Encounter for other preprocedural examination: Principal | ICD-10-CM

## 2022-07-28 NOTE — Progress Notes
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 He presented in 07/2021 to establish hematologic malignancies care given that his daughter lives in [place]. He resides in [place] and is under the care of Dr. Sofie Rower of [facility] in [place]. He established care with Dr. Mel Almond 05/14/21. He has been under observation for the CMML. Around February, his Hb was found to be 5 for which he received 2U of PRBC. He had had no viral syndrome prior. 3-4 weeks prior he had sustained a bad fall in which his LLE became significantly swollen. He was to have a [place] LLE which did not occur. There is a question of whether he could have bled into his leg. At the time he became progressively fatigued and SOB. He had a cardiac evaluation including a pharmacologic stress test which was reported to him as normal. He did not have endoscopies; last colonoscopy was in 2021. He was found to be iron deficient and his Hb responded to IV iron rising to 12.2 in 12/2020. A bone marrow biopsy and aspiration was repeat 05/16/21 and reported to be stable. 07/08/21 he [address] Dr. Mendel Corning at [facility] who recommended deferring allogeneic SCT until the time of progression and HMA until the time of critical cytopenias.At baseline he is chronically tired, sleeps very poorly; has been recommended to have sleep test.He is not limited in his activities; walks once a day for 20 minutes.In [place] he was bicycling and walking up to 3-4X/day.Dr. Mel Almond reached out to inform me that Mr. 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.He was offered admission at [facility] and decided to come to [place] to be treated near his family.Review of CBC in care everywhere shows that WBC count trended down to 1.29, ANC to 0.31 with Hb 12.4 on the same date and platelets 121, 01/16/22.The following CBC on 04/07/22 showed WBC 1.4, ANC 0.75, Hb 5.9, MCV 101, Platelets 383 for which he was transfused 2U of PRBC on 04/09/22 and 1U of PRBC on 04/24/22.The anemia w/u on 04/07/22 showed ferritin 15, epo level 632, TSH 4.2, B12 1342, folate >20, retic 6.29%, LDH 257(ULN 246).The development of severe bicytopenia triggered the bone marrow biopsy and aspiration 04/28/22.He was subsequently started on venofer and had 5-6 infusions of 200 mg.He did not have endoscopies which were set up for him out of concern that his post-nasal gtt would make them difficult to tolerate.Internal History:He had Hickman catheter placed on 06/10/22. [place] guided marrow 06/15/22 showed CMML with increased blasts 10-15% with focal areas of 20-25%Started C1D1 Dec-Ven 06/16/22 Post-C1 marrow showed decrease in blasts to <5%He denies any complaints, feels well. Ongoing fatigue and some instability, but does not require any assistance. He is living at home with his daughter (and his wife).Pathology:07/14/2022 BONE MARROW, BIOPSY, CLOT SECTION AND ASPIRATE:           - HYPERCELLULAR MARROW WITH MARKED MEGAKARYOCYTIC HYPERPLASIA (see Note) NOTE: The morphologic findings are most consistent with continued involvement by patient's known myeloid neoplasm. CD34-positive blasts are decreased compared to the patient's prior biopsy (estimated to be <5% of cellularity by immunohistochemical studies). Please correlate with molecular studies. Cytogenetics and molecular studies  are pending4/29/24: BONE MARROW, BIOPSY, CLOT SECTION AND ASPIRATE:      - MYELOID NEOPLASM WITH INCREASED BLASTS (see note) NOTE: Patient's history of CMML is noted. CD34 shows 10-15% blasts overall, consistent with CMML-2. However, focally the blasts are up to 20-25%, worrisome for progression to AML. Please correlate with pending molecular/cytogenetic studies Normal FISH results for the 5q, 7q, 20q, RUNX1T1 (8q), RUNX1, ABL1, BCR, NUP98, KMT2A, MYH11 and CBFB loci in bone marrow cells. 46, XYHeme CCS PATHOLOGIST INTERPRETATION: Pathogenic variants are identified in [name], ASXL1, and RUNX1, as well as variants of possible pathogenic significance in CEBPA and STAG2 (3 variants). Level 1 - Variants of Diagnostic, Prognostic, or Therapeutic Significance Gene name:[name] Variant Locations:chr17:[id number] Variant (protein):P95H (p.Pro95His) Variant (coding DNA):c.284C>A ([id number]) Variant Type:Missense Allelic Fraction/Coverage:41% (330.0) Significance:This variant is of known pathologic significance in myeloid neoplasms. Gene name:ASXL1 Variant Locations:chr20:[id number] Variant (protein):G623fs (p.Gly646TrpfsTer12) Variant (coding DNA):c.1934dup ([id number]) Variant Type:Frameshift Allelic Fraction/Coverage:37% (830.0) Significance:Pathogenic frameshift variant frequently reported in heme malignancies. Gene name:RUNX1 Variant Locations:chr21:36231773 Variant (protein):R204Q (p.Arg204Gln) Variant (coding DNA):c.611G>A ([id number]) Variant Type:Missense Allelic Fraction/Coverage:30% (906.0) Significance:Pathogenic missense variant frequently reported in heme malignancies. Level 2 - Variants with Unknown Clinical Significance, May be Related to Disease Gene name:STAG2 Variant Locations:chrX:[id number] Variant (protein):Y636Ter (p.Tyr636Ter) Variant (coding DNA):c.1908C>A ([id number]) Variant Type:Stop gained Allelic Fraction/Coverage:59% (276.0) Significance:Likely pathogenic nonsense variant that has not been reported in heme malignancies. Gene name:STAG2 Variant Locations:chrX:[id number] Variant (protein):L577fs (p.Leu502ValfsTer7) Variant (coding DNA):c.1500dup ([id number]) Variant Type:Frameshift Allelic Fraction/Coverage:14% (192.0) Significance:Novel frameshift variant of likely pathogenic significance. Gene name:CEBPA Variant Locations:chr19:33793123 Variant (protein):Y75fs (p.Tyr67LeufsTer41) Variant (coding DNA):c.198dup ([id number]) Variant Type:Frameshift Allelic Fraction/Coverage:9% (446.0) Significance:Frameshift variant in CEBPA that does not fall within the bZIP domain and has been rarely reported in heme malignancies. Gene name:STAG2 Variant Locations:chrX:[id number] Variant (protein):R110Ter (p.Arg110Ter) Variant (coding DNA):c.328C>T ([id number]) Variant Type:Stop gained Allelic Fraction/Coverage:3% (233.0) Significance:A nonsense variant it detected, which has rarely been reported in the literature. Level 3 - Variants with Unknown Clinical Significance, Unlikely Related to Disease Gene name:FLT3 Variant Locations:chr13:28578283 Variant (protein):S963L (p.Ser963Leu) Variant (coding DNA):c.2888C>T ([id number]) Variant Type:Missense Allelic Fraction/Coverage:50% (518.0) Gene name:ATM Variant Locations:chr11:108160350 Variant (protein):L1420F (p.Leu1420Phe) Variant (coding DNA):c.4258C>T ([id number]) Variant Type:Missense Allelic Fraction/Coverage:48% (691.0) Gene name:TET2 Variant Locations:chr4:106190882 Variant (protein):N1387S (p.Asn1387Ser) Variant (coding DNA):c.4160A>G ([id number]) Variant Type:Missense Allelic Fraction/Coverage:41% (620.0) Gene name:TET2 Variant Locations:chr4:106180878 Variant (protein):R1302S (p.Arg1302Ser) Variant (coding DNA):c.3906A>T ([id number]) Variant Type:Missense Allelic Fraction/Coverage:38% (645.0 	04/28/22 BONE MARROW, ASPIRATE SMEARS, CLOT SECTION AND CORE BIOPSY:               Hypercellular bone marrow (80%) with involvement by a chronic myelodysplastic/myeloproliferative neoplasm, compatible with CMML-2.              Significantly increased myeloid blasts (SEE COMMENTS)              No lymphoproliferative disorder or plasma cell neoplasm identified.              Decreased storage iron; no ringed sideroblasts identified.               Mild bone marrow reticulin fibrosis.Flow cytometric analysis identified significantly increased CD34+ myeloid blasts (9.7%). No monoclonal B-cell population, monoclonal plasma cell population or aberrant T-cell expression was seen (see separate report for details). Comment: ...there are significantly increased CD34+ myeloid blasts within the bone marrow averaging 10-15% in most areas, with one focal area with significantly more blasts which are starting to aggregate in the 20%+ range. These latter findings are very concerning for evolving Acute Myeloid Leukemia. 46,XY[20]ASXL1 p.Gly646TrpfsX12 VAF 32%[name] p.Pro95His VAF 50%STAG2 p.VEL381OFBPZW2 VAF 33%STAG2 HEN2778E VAF  54%RUNX1 p.Arg204GIn VAF 25% Tier IITET2 p.Asn1387Ser VAF 49% Tier II3/31/2023 Bone marrow biopsyMyeloid neoplsm without excess blasts50%cellular marrow with dysmegakaryopoiesis and monocytic hyperplasiaAcellular aspirateNo excess fibrosis on reticulin stainingFlow without increased blasts; increased monocytes with normal phenotype46XX [?]ASXL1, TET2 and [name] mutated2021 marrow results unavailablePMH:?CLLHypertensionHyperlipidemiaGlaucomaBasal cell carcinoma, foreheadProstate hypertrophyDepressionAnxietyMedications: Prophylaxis reviewed. Full medication reconciliation pending.No Known Allergies FH: No history of hematologic malignanciesFather - deceased at 61 of complications of metastatic prostate cancerMother - decease in her 23s of metastatic breast cancerSister/Nancy - breast cancer in her 30s, 66Sister - in good health, 63Daughter - type I DMDaughter - good healthSH: He lives with his wife. He retired from [employer] in 2009. Moved [place] [facility] to [place] area in 2023. Never smoker. 1-2 beers a week. Bicycling and gardening as hobbies.His daughter lives in [place]. Physical Exam: Day 1,Cycle 206/04/24 Weight 113.9 kg (251 lb 1.7 oz) BP 131/69 Temp 97.6 ?F (36.4 ?C) Pulse 73 Resp 18 SpO2 100 % General: Tired-appearing man in NAD.HEENT: NC/AT, anicteric sclera, MMM, OP clearCV: RRR with occasional ectopy, nl s1/s2, no m/r/gLungs: CTA BLAbdomen: nondistended, obese, soft, no ttp, no masses or organomegaly appreciatedExtremities: wwp, no edemaNeurologic: A&OX3, grossly non-focalSkin: no rash on the inspected torsoLabs:Lab Results Component Value Date  WBC 0.7 (LL) 07/21/2022  HGB 9.4 (L) 07/21/2022  HCT 30.50 (L) 07/21/2022  MCV 88.2 07/21/2022  PLT 416 07/21/2022    Chemistry    Component Value Date/Time  NA 138 07/21/2022 1359  K 4.3 07/21/2022 1359  CL 106 07/21/2022 1359  CO2 22 07/21/2022 1359  BUN 14 07/21/2022 1359  CREATININE 0.98 07/21/2022 1359  GLU 107 (H) 07/21/2022 1359  PROT 7.3 07/21/2022 1359    Component Value Date/Time  CALCIUM 8.9 07/18/2022 1036  ALKPHOS 114 07/18/2022 1036  AST 24 07/18/2022 1036  ALT 16 07/18/2022 1036  BILITOT 0.5 07/18/2022 1036  ALBUMIN 4.1 07/18/2022 1036   Assessment and Plan:Mr. Neet is a 72 year old man with a history of depression, anxiety, iron deficiency anemia, CLL, CMML (diagnosed 12/2019) who developed mild pancytopenia in 01/2022 followed by severe anemia with Hb 5 iso of iron deficiency and persistent leukopenia/neutropenia 04/07/22 now with marrow done in [facility] under the care of Dr. Marnette Burgess raising concern for disease evolving to AML.# Secondary AML from CMMLDiagnostics- Repeat [place] guided bmbx 06/15/22 with CMML with increased blasts 10-15% with focal areas of 20-25%. 	Heme CCS with level 1 [name], ASXL1, RUNX1 as well as variants of possible pathogenic significance in CEBPA and STAG2 (3 variants). NPM1 PCR neg, FISH panel within normal- FLT3-TKD/ITD PCR from PB negative 05/29/22. - Post C1 marrow with no increased blasts (1% on aspirate)- HLA sent and resulted 05/30/22, CMV IgG negative- Post C2 marrow with <5% cells of IHC. FC with 7% blastsTreatment- Plan is pending results of w/u with options ranging from intensive induction chemotherapy such as with Vyxeos vs. Hma+/- venetoclax vs treatment on a clinical trial such as in the event of NPM1 mutation, MLL-rearrangement.- Consented for Decitabine/venetoclax which is currently the plan for induction in the absence of an NPM1 mutation, MLL-rearrangement or FLT3 mutation- C1D1 06/16/22 Dec-Ven - C2D1 07/21/22 - Dec-VenMonitoring- Biweekly labs with CBC w/ diff, CMP, TLS and DIC labs for now.- TLS ppx: start allopurinol 300 mg PO dailySupportive care- TLS ppx: allopurinol 300 mg PO daily (eprescribed)- Hb goal >=8, Plts >=20 (consented for transfusions today)- Hickman ocare# Anti-microbial ProphylaxisHSV/VZV: Acylovir 400 mg PO bid (prior dose adjusted; eprescribed)Bacterial: Fluoroquinolone for ANC<500 --> Will decrease ppx to 500 mg daily (  eprescribed)Fungal: broad azole for ANC<500 --> voriconazole (eprescribed)# CLL- 04/28/22 BM aspirate flow cytometry did not detect a monoclonal B cell population# Iron deficiency anemia- s/p IV iron ~1g March 2024- Send [place], repeat reticulocyte count, iron studie- pending- urgent EGD/colonoscopy referral placed as anti-neoplastic therapy and disease are expected to result in increased bleeding risk due to anticipated thrombocytopenia >> appointment on 08/25/2022# AnxietyManaged with non-pharmacologic methods.Summary of Plan:Follow-up with transplant 07/27/22 (Dr. Buzzy Han labs to monitor for transfusion needs, TLS, DICContinue with C2 Dec Ven, plan for 14 days of VenetoclaxFollow-up prior to C3 Dec VenDiscussed with Dr. Larene Pickett. Novice Vrba, MDHematology-Oncology Fellow, [facility] saw and examined the patient and was present during the key portion of the E/M service. I agree with the history and exam as documented by the resident. Date Of Service: 6/4/24Mr. Guernsey is a 72 year old man with a history of depression, anxiety, iron deficiency anemia, CLL, CMML (diagnosed 12/2019) who developed mild pancytopenia in 01/2022 followed by severe anemia with Hb 5 iso of iron deficiency and persistent leukopenia/neutropenia 04/07/22 now with marrow done in [facility] under the care of Dr. Marnette Burgess raising concern for disease evolving to AML, confirmed on marrow here 06/15/22.He presents for scheduled f/u.He is clinically well.Exam is notable for ongoing BL LE below the calves.CBC show WBC 0.7, ANC 0.33, Hb 9.4, Plts 416. The Hb reflects hematologic improvement compared to prior to treatment. We reviewed the results of 07/14/22 BM bx together and the cytoreduction to <5% blasts by IHC, flow (not the gold standard for flow enumeration) shows 7%, hypercellular marrow. Hx of CMML-MDS subtype and neutropenia likely related to persistent MN albeit with cytoreduction. We are proceeding with initiation of C2. As he is neutropenic he will continue levofloxacin and voriconazole.We are planning on 14 days of venetoclax 100  mg daily during this cycle. Timing of next bone marrow biopsy and aspiration is expected to be pre-transplant in the absence of clinical/lab changes to warrant an earlier look.We will see him for f/u prior to C3 of Dec/ven.Otherwise, agree with assessment and plan outlined above. Vernelle Emerald, MD PhDHematology AttendingElectronically Signed by Vernelle Emerald, MD, June 11, 2024This is a high complexity decision making encounter.

## 2022-07-28 NOTE — Progress Notes
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 Sherrell Puller INTERIM HISTORY: He retuns in follow up to discuss transplant. He had a follow up bone marrow biopsy May 28th this was following cycle 1 of decitabine and venetoclax. 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 Search of the registry has thus far identified 2 fully matched unrelated donors. Sister Harriett Sine is haploidentical. Overall, he is doing well. He states treatment is going well. He did transfusion last month. He denies platelet transfusion. He reports constipation from the treatment. He reports a bruise on the chest has improved. He denies pain or itching. He has been eating well and drinking okay. He drinks cranberry juice and drinks less water. He denies cold, fever, or interim infection. We reviewed the current medication list and made the necessary changes to chart. HPI:  The patient has history of hypertension, hyperlipidemia, basal cell cancer of the skin, iron deficiency anemia.  The patient was evaluated for cytopenias in 2021 in [place].  Initial evaluation from [facility], source records are not available today.  Per the patient's subsequent records, he initially presented with leukopenia, thrombocytopenia, and monocytosis.  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  Initial bone marrow was performed on August 24, 2019, was reviewed at [facility] with findings of multilineage dysplasia.  The marrow was 40% cellular.  Megakaryocytes showed frequent dysplasia with  hyperchromatic forms and clustering.  Blasts were not increased.  A FISH panel for MDS  loci was negative.  Karyotype was normal.  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  The patient was observed and was without any treatment or transfusions.  In 2023, he had an acute drop in hemoglobin, was found to be iron deficient.  Workup for this did not show a clear focus of blood loss or bleeding.  The patient has continued evaluation and established care with Dr. Sherrell Puller in June 2023 as he spends some summers in [place].  Of note, there is a label of CLL in the patient's chart, which I suspect is was a typo, and find no evidence that support that diagnosis since it was a typo.  Dr. Dema Severin assessment in June 2023 per the CPSS molecular score being considered intermediate one with a low risk of development of leukemia and median survival over 5 years.  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  The patient had a marrow performed at [facility] on 29th with CD34 stain showing 10% to 15% blasts focally more than 20%.  Marrow was hypercellular at 70%.  Dysplastic megakaryocytes again appreciated.  Molecular studies are pending.  Given the findings, treatment with the decitabine and venetoclax was recommended.  A referral has been placed for urgent EGD colonoscopy given the low iron concern for occult source of bleeding.He started treatment last week which he is tolerated well. He received 5-6 red blood cell transfusion in the past several month. He was suffering weakness and dyspnea which is now  improved after transfusion. He does not have a significant infectious history, he did have COVID-19 in the past from which he recovered. He has hypertension and hyperlipidemia managed by cardiologist in [place]. He reports a negative stress test in the past. He has had number of skin cancers and has had several MOHS procedures. He is followed with a urologist for some BPH and abnormal PSA. He has had 2 prostate biopsies which was negative. He carries a diagnosis of iron deficiency and require transfusion in [place] after a significant and acute drop in hemoglobin. There was no clinical evidence of bleeding. Endoscopy and colonoscopy are planned to exclude a source of bleeding. Douglas [name] is a 72 yo gentlemen that lives with is wife in [place].  He is currently spending the summer in [place] visiting family.  He has 2 daughters and 5 grandchildren.  Retired, having worked for [employer].  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  He is a social drinker of 1-2 beers per week, denies illicit drug use and is a non smoker.  Covid-19 vaccine series completed. 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 (HC Code)   CLL (chronic lymphocytic leukemia) (HC Code)   Depression   DOE (dyspnea on exertion)   Enlarged prostate   Essential hypertension   Family history of ischemic heart disease   Glaucoma   Hypertensive heart disease without heart failure   Mixed hyperlipidemia  Surgical HistoryPast Surgical History: Procedure Laterality Date  ADENOIDECTOMY    BONE MARROW BIOPSY    PROSTATE BIOPSY    TONSILLECTOMY    VASCULAR SURGERY   AllergiesNo Known AllergiesCurrent Outpatient Medications:   acyclovir (ZOVIRAX) 400 mg tablet, Take 1 tablet (400 mg total) by mouth 2 (two) times daily., Disp: 60 tablet, Rfl: 5  allopurinoL (ZYLOPRIM) 300 mg tablet, take 1 tablet by mouth every day (Patient not taking: Reported on 07/15/2022), Disp: 30 tablet, Rfl: 0  amLODIPine (NORVASC) 10 mg tablet, Take 1 tablet (10 mg total) by mouth., Disp: , Rfl:   amLODIPine (NORVASC) 10 mg tablet, Take 1 tablet (10 mg total) by mouth daily., Disp: , Rfl:   aspirin 81 mg EC delayed release tablet, Take 1 tablet (81 mg total) by mouth., Disp: , Rfl:   atorvastatin (LIPITOR) 40 mg tablet, take 1 tablet by mouth at bedtime, Disp: , Rfl:   cholecalciferol (VITAMIN D-3) 50 mcg (2,000 unit) capsule, Take 1 capsule (2,000 Units total) by mouth daily. (Patient not taking: Reported on 07/01/2022), Disp: , Rfl:   coenzyme Q10 300 mg Cap, Take 300 mg by mouth. (Patient not taking: Reported on 07/01/2022), Disp: , Rfl:   finasteride (PROSCAR) 5 mg tablet, TAKE 1 TABLET BY MOUTH DAILY FOR ENLARGED PROSTATE WITH URINATION PROBLEM TAKE WITH OR WITHOUT MEALS, Disp: , Rfl:   latanoprost (XALATAN) 0.005 % ophthalmic solution, Place 1 drop into both eyes nightly., Disp: , Rfl:   levoFLOXacin (LEVAQUIN) 500 mg tablet, Take 1 tablet (500 mg total) by mouth daily., Disp: , Rfl:   lisinopriL (PRINIVIL,ZESTRIL) 40 mg tablet, take 1 tablet by mouth every day in the morning, Disp: , Rfl:   magnesium oxide (MAG-OX) 400 mg (241.3 mg magnesium) tablet, Take 1 tablet (400 mg  total) by mouth., Disp: , Rfl:   metoprolol succinate XL (TOPROL-XL) 50 mg 24 hr tablet, Take 1 tablet (50 mg total) by mouth daily., Disp: , Rfl:   ondansetron (ZOFRAN-ODT) 8 mg disintegrating tablet, Dissolve 1 tablet (8 mg total) by mouth every 12 (twelve) hours as needed for nausea or vomiting., Disp: 30 tablet, Rfl: 1  voriconazole (VFEND) 200 mg tablet, Take 1 tablet (200 mg total) by mouth every 12 (twelve) hours., Disp: 30 tablet, Rfl: 5No current facility-administered medications for this visit.Facility-Administered Medications Ordered in Other Visits:   sodium chloride 0.9 % flush 10 mL, 10 mL, Intra-Catheter, PRN for [facility], Vernelle Emerald, MD  sodium chloride 0.9 % flush 20 mL, 20 mL, Intra-Catheter, PRN for [facility], Vernelle Emerald, MD  sodium chloride 0.9% infusion, 50 mL/hr, Intravenous, PRN, Vernelle Emerald, MDPast Medical History: Diagnosis Date  Anxiety   Basal cell carcinoma   Benign prostatic hyperplasia without lower urinary tract symptoms 08/29/2015  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 (HC Code)   CLL (chronic lymphocytic leukemia) (HC Code)   Depression   DOE (dyspnea on exertion)   Enlarged prostate   Essential hypertension   Family history of ischemic heart disease   Glaucoma   Hypertensive heart disease without heart failure   Mixed hyperlipidemia  Family History Problem Relation Age of Onset  Breast cancer Mother   Liver cancer Mother   Heart disease Father   Prostate cancer Father   Breast cancer Sister   Thyroid disease Sister   Hearing loss Sister   Diabetes Daughter   No Known Problems Daughter   No Known Problems Grandchild  Social History Socioeconomic History  Marital status: Married   Spouse name: Not on file  Number of children: Not on file  Years of education: Not on file  Highest education level: Not on file Occupational History  Not on file Tobacco Use  Smoking status: Never  Smokeless tobacco: Never Vaping Use  Vaping Use: Never used Substance and Sexual Activity  Alcohol use: Yes   Alcohol/week: 2.0 standard drinks of alcohol   Types: 2 Cans of beer per week   Comment: social drinker once a week  Drug use: Never  Sexual activity: Not on file Other Topics Concern  Not on file Social History Narrative  Not on file Social Determinants of Health Financial Resource Strain: Low Risk  (08/05/2021)  Overall Financial Resource Strain (CARDIA)   Difficulty of Paying Living Expenses: Not very hard Food Insecurity: No Food Insecurity (08/05/2021)  Hunger Vital Sign   Worried About Running Out of Food in the Last Year: Never true   Ran Out of Food in the Last Year: Never true Transportation Needs: No Transportation Needs (08/05/2021)  PRAPARE - Designer, jewellery (Medical): No   Lack of Transportation (Non-Medical): No Physical Activity: Not on file Stress: Not on file Social Connections: Not on file Intimate Partner Violence: Not on file Housing Stability: Low Risk  (08/05/2021)  Housing Stability   Housing Stability: I have a steady place to live   Housing Stability: Not on file EXAM:Temp:  [98 ?F (36.7 ?C)] 98 ?F (36.7 ?C)Pulse:  [74] 74Resp:  [19] 19BP: (122)/(71) 122/71SpO2:  [100 %] 100 %General Appearance:He is heavy set, appears wellHEENT:  Mucous membranes moist, No oral lesions, no erythema, teeth are adequate repair, some mild erythema in the right lower eyelidLungs: ClearCor: RRR, no m/r/gAbd: soft nontender, no hepatosplenomegaly, Extr: no edemaSkin: Some actinic changes, linear rash on right  chest below hickman dressingLymph Nodes - no palpable adenopathy. Neurologic: mentation appears normal, normal speech, strength symmetric. LABSLab Results Component Value Date  WBC 1.2 (L) 07/24/2022  HGB 9.4 (L) 07/24/2022  HCT 30.90 (L) 07/24/2022  MCV 87.8 07/24/2022  PLT 347 07/24/2022 Comprehensive Metabolic Panel:Lab Results Component Value Date  GLU 89 07/24/2022  BUN 13 07/24/2022  CREATININE 1.03 07/24/2022  NA 139 07/24/2022  K  07/24/2022    Comment:    Sample Hemolyzed.   CL 108 (H) 07/24/2022  CO2 22 07/24/2022  ALBUMIN 4.2 07/24/2022  PROT 7.6 07/24/2022  BILITOT 0.6 07/24/2022  ALKPHOS 112 07/24/2022  ALT 7 (L) 07/24/2022  GLOB 3.4 07/24/2022  CALCIUM 8.9 07/24/2022 Results in Past 7 DaysResult Component Current Result LD 354 (H) (07/21/2022) ]On the day of this patient's encounter, a total of 60 minutes was personally spent by me.  This does not include any resident/fellow teaching time, or any time spent performing a procedural service. 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  His disease is being characterized by minor cytopenias over a long period of observation with recently worsened anemia, thrombocytopenia, and a marrow with increased blasts consistent with disease progression. 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 Therapy was started with decitabine and venetoclax in late April He is doing well with improvement in his marrow on bone marrow after cycle 1 of treatment. He has fully matched donors in the registry. I suggested we move forward with transplant planning. We will review this with Doctor Sherrell Puller. Likely he could be admitted in late July a for a reduced intensity MUD allograft. We had more review today of some general concepts regarding transplant and will plan a pre transplant workup. I did confirm we would want him to have endoscopy, colonoscopy in the light of his prior iron deficiency, this is planned for early July. Dr. Chrystine Oiler MD Scribed for Chrystine Oiler, MD by Simona Huh, medical scribe June 10, 2024The documentation recorded by the scribe accurately reflects the services I personally performed and the decisions made by me. I reviewed and confirmed all material entered and/or pre-charted by the scribe.

## 2022-07-29 ENCOUNTER — Inpatient Hospital Stay: Admit: 2022-07-29 | Discharge: 2022-07-29 | Payer: MEDICARE

## 2022-07-29 DIAGNOSIS — C9200 Acute myeloblastic leukemia, not having achieved remission: Secondary | ICD-10-CM

## 2022-07-29 DIAGNOSIS — C931 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: Secondary | ICD-10-CM

## 2022-07-29 DIAGNOSIS — R791 Abnormal coagulation profile: Secondary | ICD-10-CM

## 2022-07-29 LAB — CBC WITH AUTO DIFFERENTIAL
BKR WAM ABSOLUTE IMMATURE GRANULOCYTES.: 0.04 x 1000/ÂµL (ref 0.00–0.30)
BKR WAM ABSOLUTE LYMPHOCYTE COUNT.: 0.25 x 1000/ÂµL — ABNORMAL LOW (ref 0.60–3.70)
BKR WAM ABSOLUTE NRBC (2 DEC): 0 x 1000/ÂµL (ref 0.00–1.00)
BKR WAM ANALYZER ANC: 0.84 x 1000/ÂµL — ABNORMAL LOW (ref 2.00–7.60)
BKR WAM BASOPHIL ABSOLUTE COUNT.: 0 x 1000/ÂµL (ref 0.00–1.00)
BKR WAM BASOPHILS: 0 % (ref 0.0–1.4)
BKR WAM EOSINOPHIL ABSOLUTE COUNT.: 0 x 1000/ÂµL (ref 0.00–1.00)
BKR WAM EOSINOPHILS: 0 % (ref 0.0–5.0)
BKR WAM HEMATOCRIT (2 DEC): 28.3 % — ABNORMAL LOW (ref 38.50–50.00)
BKR WAM HEMOGLOBIN: 8.7 g/dL — ABNORMAL LOW (ref 13.2–17.1)
BKR WAM IMMATURE GRANULOCYTES: 3.4 % — ABNORMAL HIGH (ref 0.0–1.0)
BKR WAM LYMPHOCYTES: 21.6 % (ref 17.0–50.0)
BKR WAM MCH (PG): 26.7 pg — ABNORMAL LOW (ref 27.0–33.0)
BKR WAM MCHC: 30.7 g/dL — ABNORMAL LOW (ref 31.0–36.0)
BKR WAM MCV: 86.8 fL (ref 80.0–100.0)
BKR WAM MONOCYTE ABSOLUTE COUNT.: 0.03 x 1000/ÂµL (ref 0.00–1.00)
BKR WAM MONOCYTES: 2.6 % — ABNORMAL LOW (ref 4.0–12.0)
BKR WAM NEUTROPHILS: 72.4 % — ABNORMAL HIGH (ref 39.0–72.0)
BKR WAM NUCLEATED RED BLOOD CELLS: 0 % (ref 0.0–1.0)
BKR WAM PLATELETS: 134 x1000/ÂµL — ABNORMAL LOW (ref 150–420)
BKR WAM RDW-CV: 17.6 % — ABNORMAL HIGH (ref 11.0–15.0)
BKR WAM RED BLOOD CELL COUNT.: 3.26 M/ÂµL — ABNORMAL LOW (ref 4.00–6.00)
BKR WAM WHITE BLOOD CELL COUNT: 1.2 x1000/ÂµL — ABNORMAL LOW (ref 4.0–11.0)

## 2022-07-29 LAB — COMPREHENSIVE METABOLIC PANEL
BKR A/G RATIO: 1.4 x 1000/ÂµL (ref 1.0–2.2)
BKR ALANINE AMINOTRANSFERASE (ALT): 20 U/L (ref 9–59)
BKR ALBUMIN: 4.2 g/dL (ref 3.6–5.1)
BKR ALKALINE PHOSPHATASE: 108 U/L — ABNORMAL HIGH (ref 9–122)
BKR ANION GAP: 13 (ref 7–17)
BKR ASPARTATE AMINOTRANSFERASE (AST): 25 U/L — ABNORMAL LOW (ref 10–35)
BKR AST/ALT RATIO: 1.3
BKR BILIRUBIN TOTAL: 0.5 mg/dL (ref 0.0–<=1.2)
BKR BLOOD UREA NITROGEN: 14 mg/dL (ref 8–23)
BKR BUN / CREAT RATIO: 14.9 % (ref 8.0–23.0)
BKR CALCIUM: 8.9 mg/dL (ref 8.8–10.2)
BKR CHLORIDE: 105 mmol/L (ref 98–107)
BKR CO2: 21 mmol/L — ABNORMAL LOW (ref 20–30)
BKR CREATININE: 0.94 mg/dL (ref 0.40–1.30)
BKR EGFR, CREATININE (CKD-EPI 2021): >60 mL/min/{1.73_m2} (ref >=60)
BKR GLOBULIN: 3 g/dL — ABNORMAL LOW (ref 2.0–3.9)
BKR GLUCOSE: 102 mg/dL — ABNORMAL HIGH (ref 70–100)
BKR POTASSIUM: 4.3 mmol/L — ABNORMAL LOW (ref 3.3–5.3)
BKR PROTEIN TOTAL: 7.2 g/dL (ref 5.9–8.3)
BKR SODIUM: 139 mmol/L — ABNORMAL LOW (ref 136–144)

## 2022-07-29 LAB — CHROMOSOME ANALYSIS (CYTOGENETICS) (YMG): BKR BANDING RESOLUTION: 400 — AB

## 2022-07-29 LAB — PT/INR AND PTT (BH GH L LMW YH)
BKR INR: 1.07 (ref 0.86–1.12)
BKR PARTIAL THROMBOPLASTIN TIME: 26.5 s (ref 23.0–31.4)
BKR PROTHROMBIN TIME: 11.8 s (ref 9.6–12.3)

## 2022-07-29 LAB — PHOSPHORUS     (BH GH L LMW YH): BKR PHOSPHORUS: 2.3 mg/dL (ref 2.2–4.5)

## 2022-07-29 LAB — LACTATE DEHYDROGENASE: BKR LACTATE DEHYDROGENASE: 337 U/L — ABNORMAL HIGH (ref 122–241)

## 2022-07-29 LAB — FIBRINOGEN     (BH GH L LMW YH): BKR FIBRINOGEN LEVEL: 248 mg/dL (ref 194–448)

## 2022-07-29 LAB — URIC ACID: BKR URIC ACID: 3.9 mg/dL (ref 2.7–7.3)

## 2022-07-29 NOTE — Progress Notes
Patient arrives ambulatory with his wife for labs poss.C2D9 Decitabine + Venetoclax (100mg  D1-14).RDL hickman dressing c/d/I, +BR/flush, labs drawn as ordered.Hgb 8.7, plts 134, and ANC 0.84.No transfusions needed.Continues on acyclovir, levaquin, and vori for prophylaxis.Patient reports feeling well, no new sx/complaints.See flow-sheet for full assessment.Declines AVS, d/c in stable condition.RTC Sat 6/15 for labs poss. Continue labs poss 2x/week on Wed and Sat. Next cycle scheduled for 7/3 this visit.

## 2022-07-30 ENCOUNTER — Telehealth: Admit: 2022-07-30 | Payer: PRIVATE HEALTH INSURANCE | Attending: Medical Oncology

## 2022-07-30 NOTE — Telephone Encounter
LVM for Douglas [name] asking for him to give me a call back. I was calling to discuss setting him up for an allogeneic stem cell transplant [phone].

## 2022-07-31 ENCOUNTER — Telehealth: Admit: 2022-07-31 | Payer: PRIVATE HEALTH INSURANCE | Attending: Medical Oncology

## 2022-07-31 NOTE — Telephone Encounter
I received a phone call back from Douglas [name]. I had called to talk about setting up and allogeneic stem cell transplant. He was with his wife at an appointment and had an appointment later in the afternoon. He will call me back on Monday 6/17 to discuss. He has my phone number [phone].

## 2022-08-01 ENCOUNTER — Encounter: Admit: 2022-08-01 | Payer: PRIVATE HEALTH INSURANCE

## 2022-08-01 ENCOUNTER — Inpatient Hospital Stay: Admit: 2022-08-01 | Discharge: 2022-08-01 | Payer: MEDICARE

## 2022-08-01 DIAGNOSIS — C931 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: Secondary | ICD-10-CM

## 2022-08-01 DIAGNOSIS — C9200 Acute myeloblastic leukemia, not having achieved remission: Secondary | ICD-10-CM

## 2022-08-01 DIAGNOSIS — C4491 Basal cell carcinoma of skin, unspecified: Secondary | ICD-10-CM

## 2022-08-01 DIAGNOSIS — E782 Mixed hyperlipidemia: Secondary | ICD-10-CM

## 2022-08-01 DIAGNOSIS — R791 Abnormal coagulation profile: Secondary | ICD-10-CM

## 2022-08-01 DIAGNOSIS — C9110 Chronic lymphocytic leukemia of B-cell type not having achieved remission: Secondary | ICD-10-CM

## 2022-08-01 DIAGNOSIS — I10 Essential (primary) hypertension: Secondary | ICD-10-CM

## 2022-08-01 DIAGNOSIS — Z8249 Family history of ischemic heart disease and other diseases of the circulatory system: Secondary | ICD-10-CM

## 2022-08-01 DIAGNOSIS — R0609 Other forms of dyspnea: Secondary | ICD-10-CM

## 2022-08-01 DIAGNOSIS — N400 Enlarged prostate without lower urinary tract symptoms: Secondary | ICD-10-CM

## 2022-08-01 DIAGNOSIS — H409 Unspecified glaucoma: Secondary | ICD-10-CM

## 2022-08-01 DIAGNOSIS — F32A Depression: Secondary | ICD-10-CM

## 2022-08-01 DIAGNOSIS — I119 Hypertensive heart disease without heart failure: Secondary | ICD-10-CM

## 2022-08-01 DIAGNOSIS — F419 Anxiety disorder, unspecified: Secondary | ICD-10-CM

## 2022-08-01 LAB — FIBRINOGEN     (BH GH L LMW YH): BKR FIBRINOGEN LEVEL: 255 mg/dL (ref 194–448)

## 2022-08-01 LAB — COMPREHENSIVE METABOLIC PANEL
BKR A/G RATIO: 1.5 (ref 1.0–2.2)
BKR ALANINE AMINOTRANSFERASE (ALT): 23 U/L (ref 9–59)
BKR ALBUMIN: 4.3 g/dL (ref 3.6–5.1)
BKR ALKALINE PHOSPHATASE: 106 U/L (ref 9–122)
BKR ANION GAP: 10 (ref 7–17)
BKR ASPARTATE AMINOTRANSFERASE (AST): 31 U/L (ref 10–35)
BKR AST/ALT RATIO: 1.3 % (ref 0.0–1.0)
BKR BILIRUBIN TOTAL: 0.7 mg/dL (ref <=1.2)
BKR BLOOD UREA NITROGEN: 17 mg/dL (ref 8–23)
BKR BUN / CREAT RATIO: 17.7 (ref 8.0–23.0)
BKR CALCIUM: 9 mg/dL — ABNORMAL LOW (ref 8.8–10.2)
BKR CHLORIDE: 107 mmol/L — CL (ref 98–107)
BKR CO2: 22 mmol/L (ref 20–30)
BKR CREATININE: 0.96 mg/dL — ABNORMAL LOW (ref 0.40–1.30)
BKR EGFR, CREATININE (CKD-EPI 2021): >60 mL/min/{1.73_m2} — ABNORMAL LOW (ref >=60–7.60)
BKR GLOBULIN: 2.8 g/dL (ref 2.0–3.9)
BKR GLUCOSE: 85 mg/dL — ABNORMAL LOW (ref 70–100)
BKR POTASSIUM: 4.3 mmol/L (ref 3.3–5.3)
BKR PROTEIN TOTAL: 7.1 g/dL (ref 5.9–8.3)
BKR SODIUM: 139 mmol/L (ref 136–144)

## 2022-08-01 LAB — CBC WITH AUTO DIFFERENTIAL
BKR WAM ABSOLUTE NRBC (2 DEC): 0 x 1000/ÂµL (ref 0.00–1.00)
BKR WAM ANALYZER ANC: 0.29 x 1000/ÂµL — ABNORMAL LOW (ref 2.00–7.60)
BKR WAM BASOPHILS: 2.8 g/dL (ref 2.0–3.9)
BKR WAM EOSINOPHILS: 31 U/L (ref 10–35)
BKR WAM HEMATOCRIT (2 DEC): 25.5 % — ABNORMAL LOW (ref 38.50–50.00)
BKR WAM HEMOGLOBIN: 7.8 g/dL — ABNORMAL LOW (ref 13.2–17.1)
BKR WAM MCH (PG): 26.4 pg — ABNORMAL LOW (ref 27.0–33.0)
BKR WAM MCHC: 30.6 g/dL — ABNORMAL LOW (ref 31.0–36.0)
BKR WAM MCV: 86.4 fL (ref 80.0–100.0)
BKR WAM NUCLEATED RED BLOOD CELLS: 0 % (ref 0.0–1.0)
BKR WAM PLATELETS: 79 x1000/ÂµL — ABNORMAL LOW (ref 150–420)
BKR WAM RDW-CV: 17.4 % — ABNORMAL HIGH (ref 11.0–15.0)
BKR WAM RED BLOOD CELL COUNT.: 2.95 M/ÂµL — ABNORMAL LOW (ref 4.00–6.00)
BKR WAM WHITE BLOOD CELL COUNT: 0.6 x1000/ÂµL — CL (ref 4.0–11.0)

## 2022-08-01 LAB — IMMATURE PLATELET FRACTION (BH GH YH)
BKR WAM IPF, ABSOLUTE: 9 x1000/ÂµL (ref <20.0)
BKR WAM IPF: 11.4 % — ABNORMAL HIGH (ref 1.2–8.6)

## 2022-08-01 LAB — URIC ACID: BKR URIC ACID: 3.3 mg/dL — ABNORMAL LOW (ref 2.7–7.3)

## 2022-08-01 LAB — PT/INR AND PTT (BH GH L LMW YH)
BKR INR: 1.05 mg/dL — ABNORMAL HIGH (ref 0.86–1.12)
BKR PARTIAL THROMBOPLASTIN TIME: 26.5 s (ref 23.0–31.4)
BKR PROTHROMBIN TIME: 11.6 s — ABNORMAL LOW (ref 9.6–12.3)

## 2022-08-01 LAB — LACTATE DEHYDROGENASE: BKR LACTATE DEHYDROGENASE: 370 U/L — ABNORMAL HIGH (ref 122–241)

## 2022-08-01 LAB — PHOSPHORUS     (BH GH L LMW YH): BKR PHOSPHORUS: 2.5 mg/dL — ABNORMAL HIGH (ref 2.2–4.5)

## 2022-08-01 MED ORDER — ACETAMINOPHEN 325 MG TABLET
325 mg | Freq: Once | ORAL | Status: CP
Start: 2022-08-01 — End: ?
  Administered 2022-08-01: 15:00:00 325 mg via ORAL

## 2022-08-01 MED ORDER — EPINEPHRINE 0.3 MG/0.3 ML INJECTION, AUTO-INJECTOR
0.30.3 mg/ mL | INTRAMUSCULAR | Status: AC | PRN
Start: 2022-08-01 — End: 2022-08-06

## 2022-08-01 MED ORDER — EPINEPHRINE 0.3 MG/0.3 ML INJECTION, AUTO-INJECTOR
0.3 mg/ mL | INTRAMUSCULAR | Status: AC | PRN
Start: 2022-08-01 — End: 2022-08-06

## 2022-08-01 MED ORDER — HYDROCORTISONE SODIUM SUCCINATE 100 MG SOLUTION FOR INJECTION
100 mg | Freq: Once | INTRAVENOUS | Status: AC | PRN
Start: 2022-08-01 — End: 2022-08-06

## 2022-08-01 MED ORDER — DIPHENHYDRAMINE 25 MG CAPSULE
25 mg | Freq: Once | ORAL | Status: CP
Start: 2022-08-01 — End: ?
  Administered 2022-08-01: 15:00:00 25 mg via ORAL

## 2022-08-01 MED ORDER — ALBUTEROL SULFATE 2.5 MG/3 ML (0.083 %) SOLUTION FOR NEBULIZATION
2.530.083 mg /3 mL (0.083 %) | RESPIRATORY_TRACT | Status: AC | PRN
Start: 2022-08-01 — End: 2022-08-06

## 2022-08-01 MED ORDER — FAMOTIDINE 4 MG/ML IN 0.9% SODIUM CHLORIDE (ADULT)
Freq: Once | INTRAVENOUS | Status: AC | PRN
Start: 2022-08-01 — End: 2022-08-06

## 2022-08-01 MED ORDER — SODIUM CHLORIDE 0.9 % BOLUS (NEW BAG)
0.9 % | Freq: Once | INTRAVENOUS | Status: AC | PRN
Start: 2022-08-01 — End: 2022-08-06

## 2022-08-01 MED ORDER — DIPHENHYDRAMINE 50 MG/ML INJECTION (WRAPPED E-RX)
50 mg/mL | Freq: Once | INTRAVENOUS | Status: AC | PRN
Start: 2022-08-01 — End: 2022-08-06

## 2022-08-01 NOTE — Patient Instructions
June 2024  Sunday Monday Tuesday Wednesday Thursday Friday Saturday        1TREATMENT 4 HOURS 10:30 AM (240 min.) [facility] [place] HEMA INFUSIONMASTER CHAIR Hematology Oncology InfusionProgram 2 3 4TREATMENT 4 HOURS  1:30 PM (240 min.) [facility] [place] HEMA MASTER ROOM Hematology Oncology InfusionProgramRETURN [place]  4:00 PM (30 min.) Vernelle Emerald, MD [facility] 5TREATMENT 2 HOURS 10:30 AM (120 min.) [facility] [place] HEMA INFUSIONMASTER CHAIR Hematology Oncology InfusionProgram 6TREATMENT 2 HOURS 10:30 AM (120 min.) [facility] [place] HEMA MASTER ROOM Hematology Oncology InfusionProgram 7TREATMENT 2 HOURS  1:00 PM (120 min.) [facility] [place] HEMA INFUSIONMASTER CHAIR Hematology Oncology InfusionProgram 8TREATMENT 4 HOURS 10:30 AM (240 min.) [facility] [place] HEMA INFUSIONMASTER CHAIR Hematology Oncology InfusionProgram 9 10RETURN [place]  1:00 PM (15 min.) Chrystine Oiler, MD [facility] 11 12TREATMENT 4 HOURS  9:30 AM (240 min.) [facility] [place] HEMA INFUSIONMASTER CHAIR Hematology Oncology InfusionProgram 13 14 15TREATMENT 4 HOURS  9:30 AM (240 min.) [facility] [place] HEMA INFUSIONMASTER CHAIR Hematology Oncology InfusionProgram 16 17 18  19TREATMENT 4 HOURS 12:00 PM (240 min.) [facility] [place] HEMA MASTER ROOM Hematology Oncology InfusionProgram 20 21 22TREATMENT 4 HOURS  9:30 AM (240 min.) [facility] [place] HEMA INFUSIONMASTER CHAIR Hematology Oncology InfusionProgram 23 24 25  26TREATMENT 4 HOURS  9:30 AM (240 min.) [facility] [place] HEMA INFUSIONMASTER CHAIR Hematology Oncology InfusionProgram 27 28 29TREATMENT 4 HOURS  9:30 AM (240 min.) [facility] Hematology Oncology InfusionProgram 15 September 2022  Sunday Monday Tuesday Wednesday Thursday Friday Saturday   1 2 3TREATMENT 4 HOURS  7:45 AM (240 min.) [facility] [place] HEMA INFUSIONMASTER CHAIR Hematology Oncology InfusionProgramRETURN [place]  8:30 AM (30 min.) Laurian Brim, Rosezella [place]., APRN [facility] 4 5TREATMENT 2 HOURS  2:45 PM (120 min.) [facility] [place] HEMA INFUSIONMASTER CHAIR Hematology Oncology InfusionProgram 6TREATMENT 4 HOURS 10:00 AM (240 min.) [facility]  HEMA INFUSIONMASTER CHAIR Hematology Oncology InfusionProgram 7TREATMENT 2 HOURS 10:30 AM (120 min.) [facility]  HEMA INFUSIONMASTER CHAIR Hematology Oncology InfusionProgram 8 9UPPER GI ENDOSCOPY; DX, W/WO SPECIMEN COLLECTION, BRUSHING/WASHING (SEP PROC)  1:34 PM Jodi Mourning, MD [facility] ENDOSCOPY 10 11 12 13 14 15 16 17 18 19 20 21 22 23 24 25 26 27 28 29 30 31       Patient Education Blood Transfusion Why is this procedure done? Blood carries oxygen and nutrients throughout your body. Sometimes, you lose blood due to an injury, accident, or surgery. When this happens, it means parts of your body may not get as much oxygen as it needs. Other times, you might have an illness that causes problems with your blood cells or your body is not able to make blood properly. If you do not have enough blood cells, you will need to have them replaced by having a blood transfusion.Blood is made up of different parts: red blood cells, white blood cells, platelets, and plasma. Sometimes a blood transfusion is given whole, with all the parts, but more often as individual parts. What will the results be? After a transfusion, your body will work better. Good, healthy blood cells will replace lost or damaged ones. What happens before the procedure? A small amount of blood will be taken from you. This will be tested for your blood type and your Rh antibody factor. Next, the blood from the donor is tested to see if it matches your blood type. Blood used for a transfusion must be your type or your blood will  attack the new blood and make you sick. Blood used for blood transfusions is collected, tested, and stored in a blood bank. The blood bank screens all blood donors and the donated blood for illnesses that can be passed on through the blood. It may be possible to use your own blood if you know you will need blood in advance or it may be possible to collect and reuse your own blood during surgery.What happens during the procedure? You will need a small tube called an IV line. It is placed in a vein in your arm or hand. The blood is given to you through this line. This may take 1 to 4 hours, depending on how much blood you need and how quickly you need it. You will be awake during the procedure. The staff will closely watch your heart rate, blood pressure, and temperature while you get the blood. What happens after the procedure? The IV line will be taken out. You may feel soreness or have some bruising at the IV site for a few days. Your doctor may order more lab tests before you can go home.What care is needed at home? You may be able to go back to your normal activities after the procedure. Ask your doctor what level of activity is right for you. What follow-up care is needed? Your doctor may ask you to make visits to the office to check on your progress. Be sure to keep these visits. What problems could happen? Risks of a blood transfusion are rare. They can include:Allergic reactions, fever, or hivesInfections, including CMV, HIV, or hepatitisA reaction to the new blood that can cause your regular blood cells or body tissue to break downKidney or lung problemsLow blood sugar or electrolyte problemsDonor blood attacks the bodyToo much fluid or iron in your bodyWhen do I need to call the doctor? Be sure to tell your doctor right away if you have any of these signs during your transfusion. They may also happen days or weeks after your transfusion. Call your doctor right away if you have:Signs of a very bad reaction. These include wheezing; chest tightness; fever; itching; bad cough; blue skin color; seizures; or swelling of face, lips, tongue, or throat. Go to the ER right away.Signs of infection. These include a fever of 100.4?F (38?C) or higher, chills.Trouble breathing or start having chest or back painHeadache or dizzinessItching or a rashWorry or feeling restlessFeel sick to your stomach or loose stoolsUrine becomes red or dark in color Where can I learn more? Kids CultureParks.com.ee?ref=search NHS Choiceshttps://www.nhs.uk/conditions/blood-transfusion/ Last Reviewed Date 2021-03-30Consumer Information Use and Disclaimer This generalized information is a limited summary of diagnosis, treatment, and/or medication information. It is not meant to be comprehensive and should be used as a tool to help the user understand and/or assess potential diagnostic and treatment options. It does NOT include all information about conditions, treatments, medications, side effects, or risks that may apply to a specific patient. It is not intended to be medical advice or a substitute for the medical advice, diagnosis, or treatment of a health care provider based on the health care provider's examination and assessment of a patient?s specific and unique circumstances. Patients must speak with a health care provider for complete information about their health, medical questions, and treatment options, including any risks or benefits regarding use of medications. This information does not endorse any treatments or medications as safe, effective, or approved for treating a specific patient. UpToDate, Inc. and its affiliates disclaim any warranty or liability relating  to this information or the use thereof. The use of this information is governed by the Terms of Use, available at https://www.wolterskluwer.com/en/know/clinical-effectiveness-terms Copyright Copyright ? 2022 UpToDate, Inc. and its affiliates and/or licensors. All rights reserved.        [facility] Discharge InstructionsNotify  Your own Physician and the Transfusion Service [phone], 8:00am-4:30pm or if after hour or Holiday, call [phone] and ask for the Blood Bank Resident on call if you develop any of the following:Temperature above 100.5 FShaking chills with or without feverPain at infusion site or in chest, abdomen, or backRespiratory changes, which may include shortness of breath, rapid breathing or difficulty breathingSkin changes, including flushing or itching, localized or generalized swelling Nausea with or without vomitingDark or red urineLight headedness or dizzinessActivities and Diet as ToleratedKeep dressing dry and intact for 3-4 hours, and then removeIf soreness develops at IV site you may use ice packs at 15 minutes intervals as necessary. After 24 hours, apply soaks.If you become dizzy, sit or lie down for a few minutes and elevate your feet.Other:

## 2022-08-01 NOTE — Progress Notes
Patient arrives ambulatory with his wife for labs poss.RDL hickman dressing changed, site no signs of infection, +BR/flush, labs drawn as ordered.Assessment completed, denies n/v/c/d/pain.Both temples have hole punch biopsy along with center of chest just above port site dressing-Band-Aid changes site clean dry no s/s/of infection notedPatient reports feeling well, no new sx/complaints.Hgb  7.8, plts  79 and ANC .0.26, WBC 0.6Continues on acyclovir, levaquin, and vori for prophylaxis.DL Hickman patent with brisk blood returnTylenol and benadryl PO given. Transfusions 1 unit of PRBC'sTolerated transfusion well, DL Hickman flushed and capped. Discharge transfusion instructions given to pt and wife, verbal understanding. Continue labs poss 2x/week on Wed and Sat. Next cycle scheduled for 7/3 this visitRTC 08/05/22

## 2022-08-03 ENCOUNTER — Encounter: Admit: 2022-08-03 | Payer: PRIVATE HEALTH INSURANCE | Attending: Medical Oncology

## 2022-08-03 DIAGNOSIS — Z01818 Encounter for other preprocedural examination: Secondary | ICD-10-CM

## 2022-08-03 DIAGNOSIS — R791 Abnormal coagulation profile: Secondary | ICD-10-CM

## 2022-08-03 DIAGNOSIS — C9110 Chronic lymphocytic leukemia of B-cell type not having achieved remission: Secondary | ICD-10-CM

## 2022-08-03 DIAGNOSIS — Z114 Encounter for screening for human immunodeficiency virus [HIV]: Secondary | ICD-10-CM

## 2022-08-05 ENCOUNTER — Inpatient Hospital Stay: Admit: 2022-08-05 | Discharge: 2022-08-05 | Payer: MEDICARE

## 2022-08-05 ENCOUNTER — Telehealth: Admit: 2022-08-05 | Payer: PRIVATE HEALTH INSURANCE | Attending: Medical Oncology

## 2022-08-05 DIAGNOSIS — C9200 Acute myeloblastic leukemia, not having achieved remission: Secondary | ICD-10-CM

## 2022-08-05 DIAGNOSIS — R791 Abnormal coagulation profile: Secondary | ICD-10-CM

## 2022-08-05 DIAGNOSIS — C9110 Chronic lymphocytic leukemia of B-cell type not having achieved remission: Secondary | ICD-10-CM

## 2022-08-05 DIAGNOSIS — Z114 Encounter for screening for human immunodeficiency virus [HIV]: Secondary | ICD-10-CM

## 2022-08-05 DIAGNOSIS — C931 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: Secondary | ICD-10-CM

## 2022-08-05 LAB — CBC WITH AUTO DIFFERENTIAL
BKR WAM ABSOLUTE LYMPHOCYTE COUNT.: 6.5 % (ref 1.2–8.6)
BKR WAM ABSOLUTE NRBC (2 DEC): 0 x 1000/ÂµL (ref 0.00–1.00)
BKR WAM ANALYZER ANC: 0.01 x 1000/ÂµL — ABNORMAL LOW (ref 2.00–7.60)
BKR WAM BASOPHILS: 3 g/dL (ref 2.0–3.9)
BKR WAM HEMATOCRIT (2 DEC): 24.8 % — ABNORMAL HIGH (ref 38.50–50.00)
BKR WAM HEMOGLOBIN: 7.8 g/dL — ABNORMAL LOW (ref 13.2–17.1)
BKR WAM IMMATURE GRANULOCYTES: 1.3 (ref 1.0–2.2)
BKR WAM LYMPHOCYTES: 109 U/L (ref 9–122)
BKR WAM MCH (PG): 26.6 pg — ABNORMAL LOW (ref 27.0–33.0)
BKR WAM MCHC: 31.5 g/dL (ref >=12)
BKR WAM MCV: 84.6 fL (ref 80.0–100.0)
BKR WAM MONOCYTE ABSOLUTE COUNT.: 1 x1000/ÂµL (ref <20.0)
BKR WAM MPV: 4 g/dL (ref 3.6–5.1)
BKR WAM NUCLEATED RED BLOOD CELLS: 0 % (ref 0.0–1.0)
BKR WAM PLATELETS: 15 x1000/ÂµL — ABNORMAL LOW (ref 150–420)
BKR WAM RDW-CV: 16.6 % — ABNORMAL HIGH (ref 11.0–15.0)
BKR WAM RED BLOOD CELL COUNT.: 2.93 M/ÂµL — ABNORMAL LOW (ref 4.00–6.00)
BKR WAM WHITE BLOOD CELL COUNT: 0.2 x1000/ÂµL — ABNORMAL HIGH (ref 4.0–11.0)

## 2022-08-05 LAB — VARICELLA ZOSTER ANTIBODY, IGG
BKR VARICELLA-ZOSTER ANTIBODY, IGG: POSITIVE
BKR VZV IGG INITIAL RESULT: 2988 IV

## 2022-08-05 LAB — MAGNESIUM: BKR MAGNESIUM: 2.2 mg/dL (ref 1.7–2.4)

## 2022-08-05 LAB — PT/INR AND PTT (BH GH L LMW YH)
BKR INR: 1.03 (ref 0.86–1.12)
BKR PARTIAL THROMBOPLASTIN TIME: 26.8 s (ref 23.0–31.4)
BKR PROTHROMBIN TIME: 11.4 s (ref 9.6–12.3)

## 2022-08-05 LAB — IMMATURE PLATELET FRACTION (BH GH YH)
BKR WAM IPF, ABSOLUTE: 1 x1000/ÂµL (ref <20.0)
BKR WAM IPF: 6.5 % (ref 1.2–8.6)

## 2022-08-05 LAB — COMPREHENSIVE METABOLIC PANEL
BKR A/G RATIO: 1.3 (ref 1.0–2.2)
BKR ALANINE AMINOTRANSFERASE (ALT): 20 U/L (ref 9–59)
BKR ALBUMIN: 4 g/dL (ref 3.6–5.1)
BKR ALKALINE PHOSPHATASE: 109 U/L (ref 9–122)
BKR ANION GAP: 11 (ref 7–17)
BKR ASPARTATE AMINOTRANSFERASE (AST): 23 U/L (ref 10–35)
BKR AST/ALT RATIO: 1.2
BKR BILIRUBIN TOTAL: 0.8 mg/dL (ref <=1.2)
BKR BLOOD UREA NITROGEN: 15 mg/dL (ref 8–23)
BKR BUN / CREAT RATIO: 15.8 % — ABNORMAL HIGH (ref 8.0–23.0)
BKR CALCIUM: 9 mg/dL (ref 8.8–10.2)
BKR CHLORIDE: 108 mmol/L — ABNORMAL HIGH (ref 98–107)
BKR CO2: 22 mmol/L (ref 20–30)
BKR CREATININE: 0.95 mg/dL (ref 0.40–1.30)
BKR EGFR, CREATININE (CKD-EPI 2021): >60 mL/min/{1.73_m2} (ref >=60)
BKR GLOBULIN: 3 g/dL (ref 2.0–3.9)
BKR GLUCOSE: 120 mg/dL — ABNORMAL HIGH (ref 70–100)
BKR POTASSIUM: 3.9 mmol/L (ref 3.3–5.3)
BKR PROTEIN TOTAL: 7 g/dL (ref 5.9–8.3)
BKR SODIUM: 141 mmol/L (ref 136–144)

## 2022-08-05 LAB — CYTOMEGALOVIRUS ANTIBODY, IGG
BKR CMV IGG INITIAL RESULT: <0.2 U/mL
BKR CYTOMEGALOVIRUS IGG ANTIBODY: NEGATIVE

## 2022-08-05 LAB — IMMUNOGLOBULIN M: BKR IGM: 38 mg/dL — ABNORMAL LOW (ref 40–230)

## 2022-08-05 LAB — HIV-1/HIV-2 ANTIBODY/ANTIGEN SCREEN W/REFLEX     (BH GH LMW YH): BKR HIV 1 AND 2 ANTIBODY/HIV-1 ANTIGEN SCREEN: NEGATIVE

## 2022-08-05 LAB — HERPES SIMPLEX 1 AND 2 ANTIBODIES, IGG     (BH GH L LMW YH)
BKR HERPES SIMPLEX 1 IGG INITIAL RESULT: 0.16 {index}
BKR HERPES SIMPLEX 2 IGG INITIAL RESULT: 0.11 {index}
BKR HERPES SIMPLEX VIRUS 1 IGG: NEGATIVE
BKR HERPES SIMPLEX VIRUS 2 IGG: NEGATIVE

## 2022-08-05 LAB — PHOSPHORUS     (BH GH L LMW YH): BKR PHOSPHORUS: 2.3 mg/dL (ref 2.2–4.5)

## 2022-08-05 LAB — TREPONEMA PALLIDUM (SYPHILIS) ANTIBODY W/REFLEX
BKR TREPONEMA PALLIDUM ANTIBODY INITIAL RESULT: <0.1 {index}
BKR TREPONEMA PALLIDUM ANTIBODY TOTAL, SERUM: NONREACTIVE

## 2022-08-05 LAB — HEPATITIS B CORE ANTIBODY, TOTAL: BKR HEPATITIS B CORE TOTAL ANTIBODY: NEGATIVE x1000/ÂµL — ABNORMAL LOW (ref 150–420)

## 2022-08-05 LAB — HEPATITIS A ANTIBODY, IGG     (YH): BKR HEPATITIS A IGG ANTIBODY: NEGATIVE

## 2022-08-05 LAB — EBNA-1 ANTIBODY, IGG     (BH GH LMW YH)
BKR EBNA-1 ANTIBODY, IGG: NEGATIVE U/L (ref 9–59)
BKR EBV EBNA INITIAL RESULT: <3 U/mL

## 2022-08-05 LAB — EPSTEIN-BARR VCA ANTIBODY, IGG     (GH LMW YH)
BKR EPSTEIN-BARR VCA IGG: NEGATIVE mg/dL (ref <=1.2)
BKR VCA IGG AB SCREEN INITIAL RESULT: <10 U/mL

## 2022-08-05 LAB — BILIRUBIN, DIRECT: BKR BILIRUBIN DIRECT: 0.2 mg/dL (ref <=0.3)

## 2022-08-05 LAB — HEPATITIS A ANTIBODY, IGM: BKR HEPATITIS A IGM ANTIBODY: NEGATIVE

## 2022-08-05 LAB — LACTATE DEHYDROGENASE: BKR LACTATE DEHYDROGENASE: 288 U/L — ABNORMAL HIGH (ref 122–241)

## 2022-08-05 LAB — HEPATITIS B SURFACE ANTIGEN     (BH GH L LMW YH): BKR HEPATITIS B SURFACE ANTIGEN: NEGATIVE

## 2022-08-05 LAB — FIBRINOGEN     (BH GH L LMW YH): BKR FIBRINOGEN LEVEL: 280 mg/dL (ref 194–448)

## 2022-08-05 LAB — URIC ACID: BKR URIC ACID: 3.5 mg/dL (ref 2.7–7.3)

## 2022-08-05 LAB — HEPATITIS C AB WITH REFLEX TO HCV PCR: BKR HEPATITIS C ANTIBODY: NEGATIVE

## 2022-08-05 LAB — EBV SEROLOGY INTERPRETATION     (BH GH LMW YH): BKR EBV SEROLOGY INTERPRETATION: NEGATIVE

## 2022-08-05 LAB — ACTX PHARMACOGENOMICS ASSESSMENT

## 2022-08-05 LAB — IMMUNOGLOBULIN G: BKR IGG: 1278 mg/dL (ref 700–1600)

## 2022-08-05 LAB — HEPATITIS B SURFACE ANTIBODY     (BH GH L LMW YH): BKR HEP B SURFACE AB INITIAL RESULT: <8 m[IU]/mL (ref >=12)

## 2022-08-05 LAB — IMMUNOGLOBULIN A: BKR IGA: 243 mg/dL (ref 70–470)

## 2022-08-05 MED ORDER — DIPHENHYDRAMINE 25 MG CAPSULE
25 mg | Freq: Once | ORAL | Status: CP
Start: 2022-08-05 — End: ?
  Administered 2022-08-05: 18:00:00 25 mg via ORAL

## 2022-08-05 MED ORDER — ACETAMINOPHEN 325 MG TABLET
325 mg | Freq: Once | ORAL | Status: CP
Start: 2022-08-05 — End: ?
  Administered 2022-08-05: 18:00:00 325 mg via ORAL

## 2022-08-05 NOTE — Unmapped
LVM for Douglas [name] letting him know that his ECHO and Chest/Abd and [place] scan have been for July 2nd. I reviewed that we would give him a call once his PFT has been booked for that day. Lab work, EKG completed. Plan for chest x ray to be completed today. I provided my phone in case he should have any questions [phone].

## 2022-08-05 NOTE — Unmapped
Tolerated rbc without incident. Hickman flushed and pt discharged with his wife. Rtc on Saturday.

## 2022-08-05 NOTE — Unmapped
Pt in to clinic for labs/poss and pre transplant workup. EKG obtained. Labs obtained from hickman. Pt reports he needs a MOHs procedure-Lisa advised not to schedule now until count recovery. Large mouth sore-had scant bleeding from yesterday but is now improving, oral care reviewed. Finished venetoclax yesterday, holding aspirin. 5 units platelets transfused without incident. Premedicated with tylenol and benadryl prior to blood-pt reports he has never had a transfusion reaction but premeds were continued after receiving in [place]. 1 unit PRBC transfusing-report given to late RNs. Reportable s/s reviewed. RTC 6/22

## 2022-08-06 LAB — IMMUNOGLOBULIN E: BKR IGE TOTAL: 10 kU/L (ref <=214)

## 2022-08-06 LAB — TOXOPLASMA GONDII ANTIBODY, IGG
BKR TOXO GONDII IGG: NEGATIVE
BKR TOXO IGG INITIAL VALUE: <3 [IU]/mL

## 2022-08-06 LAB — VRE CULTURE: BKR VRE SCREEN: NEGATIVE

## 2022-08-07 ENCOUNTER — Telehealth: Admit: 2022-08-07 | Payer: PRIVATE HEALTH INSURANCE

## 2022-08-07 LAB — HTLV I/II AB W/REFL CONFIRMATORY ASSAY (BH GH LMW YH): HTLV-I/-II AB SCREEN, S: NEGATIVE g/dL — ABNORMAL LOW (ref 13.2–17.1)

## 2022-08-07 NOTE — Unmapped
I called and left Douglas [name] a message regarding his work-up appointments scheduled for Tuesday, 7/2 starting at 1130am. He has my callback information for any questions.

## 2022-08-08 ENCOUNTER — Inpatient Hospital Stay: Admit: 2022-08-08 | Discharge: 2022-08-08 | Payer: MEDICARE

## 2022-08-08 DIAGNOSIS — C931 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: Secondary | ICD-10-CM

## 2022-08-08 DIAGNOSIS — C9200 Acute myeloblastic leukemia, not having achieved remission: Secondary | ICD-10-CM

## 2022-08-08 LAB — CBC WITH AUTO DIFFERENTIAL
BKR WAM ABSOLUTE NRBC (2 DEC): 0 x 1000/ÂµL (ref 0.00–1.00)
BKR WAM ANALYZER ANC: 0 x 1000/ÂµL — ABNORMAL LOW (ref 2.00–7.60)
BKR WAM BASOPHILS: 2.9 g/dL (ref 2.0–3.9)
BKR WAM EOSINOPHILS: 24 U/L (ref 10–35)
BKR WAM HEMATOCRIT (2 DEC): 26 % — ABNORMAL LOW (ref 38.50–50.00)
BKR WAM HEMOGLOBIN: 8.4 g/dL — ABNORMAL LOW (ref 13.2–17.1)
BKR WAM IMMATURE GRANULOCYTES: 1.5 (ref 1.0–2.2)
BKR WAM LYMPHOCYTES: 114 U/L (ref 9–122)
BKR WAM MCH (PG): 27.2 pg (ref 27.0–33.0)
BKR WAM MCHC: 32.3 g/dL (ref 31.0–36.0)
BKR WAM MCV: 84.1 fL (ref 80.0–100.0)
BKR WAM MONOCYTES: 18 U/L (ref 9–59)
BKR WAM MPV: 4.4 g/dL (ref 3.6–5.1)
BKR WAM NEUTROPHILS: 1 mg/dL (ref <=1.2)
BKR WAM NUCLEATED RED BLOOD CELLS: 0 % (ref 0.0–1.0)
BKR WAM PLATELETS: 14 x1000/ÂµL — ABNORMAL LOW (ref 150–420)
BKR WAM RDW-CV: 15.9 % — ABNORMAL HIGH (ref 11.0–15.0)
BKR WAM RED BLOOD CELL COUNT.: 3.09 M/ÂµL — ABNORMAL LOW (ref 4.00–6.00)
BKR WAM WHITE BLOOD CELL COUNT: 0.2 x1000/ÂµL — CL (ref 4.0–11.0)

## 2022-08-08 LAB — IMMATURE PLATELET FRACTION (BH GH YH)
BKR WAM IPF, ABSOLUTE: 0.3 x1000/ÂµL (ref <20.0)
BKR WAM IPF: 2.1 % (ref 1.2–8.6)

## 2022-08-08 LAB — COMPREHENSIVE METABOLIC PANEL
BKR A/G RATIO: 1.5 (ref 1.0–2.2)
BKR ALANINE AMINOTRANSFERASE (ALT): 18 U/L (ref 9–59)
BKR ALBUMIN: 4.4 g/dL (ref 3.6–5.1)
BKR ALKALINE PHOSPHATASE: 114 U/L (ref 9–122)
BKR ANION GAP: 11 (ref 7–17)
BKR ASPARTATE AMINOTRANSFERASE (AST): 24 U/L (ref 10–35)
BKR AST/ALT RATIO: 1.3
BKR BILIRUBIN TOTAL: 1 mg/dL (ref <=1.2)
BKR BLOOD UREA NITROGEN: 15 mg/dL (ref 8–23)
BKR BUN / CREAT RATIO: 15.8 (ref 8.0–23.0)
BKR CALCIUM: 9 mg/dL (ref 8.8–10.2)
BKR CHLORIDE: 104 mmol/L (ref 98–107)
BKR CO2: 21 mmol/L (ref 20–30)
BKR CREATININE: 0.95 mg/dL (ref 0.40–1.30)
BKR EGFR, CREATININE (CKD-EPI 2021): >60 mL/min/{1.73_m2} (ref >=60)
BKR GLOBULIN: 2.9 g/dL (ref 2.0–3.9)
BKR GLUCOSE: 120 mg/dL — ABNORMAL HIGH (ref 70–100)
BKR POTASSIUM: 4.2 mmol/L (ref 3.3–5.3)
BKR PROTEIN TOTAL: 7.3 g/dL (ref 5.9–8.3)
BKR SODIUM: 136 mmol/L (ref 136–144)

## 2022-08-08 LAB — LACTATE DEHYDROGENASE: BKR LACTATE DEHYDROGENASE: 317 U/L — ABNORMAL HIGH (ref 122–241)

## 2022-08-08 LAB — PHOSPHORUS     (BH GH L LMW YH): BKR PHOSPHORUS: 2.5 mg/dL (ref 2.2–4.5)

## 2022-08-08 LAB — URIC ACID: BKR URIC ACID: 3 mg/dL (ref 2.7–7.3)

## 2022-08-08 MED ORDER — SODIUM CHLORIDE 0.9 % BOLUS (NEW BAG)
0.9 % | Freq: Once | INTRAVENOUS | Status: DC | PRN
Start: 2022-08-08 — End: 2022-08-13

## 2022-08-08 NOTE — Unmapped
Here for labs/supportive care.Wife reports a temperature of 100.4 F on his forehead last night, but when she rechecked it orally , it was just 110 F.Pt denies any complaints; no discomfort.Reports SOB and fatigue; no s/s of bleeding noted.RDLH dressing and claves changed.Labs drawn-H/H- 8.4/26, platelet-14 and ANC-0.Confirmed that he takes acyclovir/levaquin/vfend for prophylaxis.Vital signs stable here in clinic; afebrile.Refusing offers of red cells.Given 1 bag of pooled platelet without incident.No premeds needed for platelet.Hickman flushed and pt discharged with his wife.RTC on Wednesday.Instructed to call the emergency number if he feels unwell or has a temperature of 100.26F or greater.

## 2022-08-10 ENCOUNTER — Encounter: Admit: 2022-08-10 | Payer: PRIVATE HEALTH INSURANCE

## 2022-08-11 MED FILL — VORICONAZOLE 200 MG TABLET: 200 mg | ORAL | 15 days supply | Qty: 30 | Fill #3 | Status: CP

## 2022-08-12 ENCOUNTER — Encounter: Admit: 2022-08-12 | Payer: PRIVATE HEALTH INSURANCE | Attending: Family

## 2022-08-12 ENCOUNTER — Encounter: Admit: 2022-08-12 | Payer: PRIVATE HEALTH INSURANCE

## 2022-08-12 ENCOUNTER — Inpatient Hospital Stay: Admit: 2022-08-12 | Discharge: 2022-08-12 | Payer: MEDICARE

## 2022-08-12 DIAGNOSIS — C931 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: Secondary | ICD-10-CM

## 2022-08-12 DIAGNOSIS — C9110 Chronic lymphocytic leukemia of B-cell type not having achieved remission: Secondary | ICD-10-CM

## 2022-08-12 DIAGNOSIS — C9200 Acute myeloblastic leukemia, not having achieved remission: Secondary | ICD-10-CM

## 2022-08-12 DIAGNOSIS — R791 Abnormal coagulation profile: Secondary | ICD-10-CM

## 2022-08-12 LAB — PT/INR AND PTT (BH GH L LMW YH)
BKR INR: 1.09 (ref 0.86–1.12)
BKR PARTIAL THROMBOPLASTIN TIME: 30.6 s (ref 23.0–31.4)
BKR PROTHROMBIN TIME: 12 s (ref 9.6–12.3)

## 2022-08-12 LAB — CBC WITH AUTO DIFFERENTIAL
BKR WAM ABSOLUTE IMMATURE GRANULOCYTES.: 105 mmol/L (ref 98–107)
BKR WAM ABSOLUTE LYMPHOCYTE COUNT.: 3.2 g/dL (ref 2.0–3.9)
BKR WAM ABSOLUTE NRBC (2 DEC): 0 x 1000/ÂµL (ref 0.00–1.00)
BKR WAM ANALYZER ANC: 0.01 x 1000/ÂµL (ref 2.00–7.60)
BKR WAM BASOPHIL ABSOLUTE COUNT.: >60 mL/min/{1.73_m2} (ref >=60–5.1)
BKR WAM BASOPHILS: 0.9 mg/dL (ref 1.2–<=1.2)
BKR WAM EOSINOPHIL ABSOLUTE COUNT.: 1.2 mmol/L (ref 136–145)
BKR WAM EOSINOPHILS: 3.9 g/dL (ref 3.6–5.1)
BKR WAM HEMATOCRIT (2 DEC): 22 % (ref 38.50–50.00)
BKR WAM HEMOGLOBIN: 7.1 g/dL (ref 13.2–17.1)
BKR WAM LYMPHOCYTES: 15 (ref 8.0–23.0)
BKR WAM MCH (PG): 27.1 pg (ref 27.0–33.0)
BKR WAM MCHC: 32.3 g/dL (ref 31.0–36.0)
BKR WAM MCV: 84 fL (ref 80.0–100.0)
BKR WAM MONOCYTE ABSOLUTE COUNT.: 1.2 mg/dL (ref 1.0–2.2)
BKR WAM MONOCYTES: 7.1 g/dL (ref 5.9–8.3)
BKR WAM MPV: 1 mg/dL (ref 0.40–1.30)
BKR WAM NEUTROPHILS: 8.9 mg/dL (ref 8.8–10.2)
BKR WAM NUCLEATED RED BLOOD CELLS: 0 % (ref 0.0–1.0)
BKR WAM PLATELETS: 3 x1000/ÂµL (ref 150–420)
BKR WAM RDW-CV: 15.9 % (ref 11.0–15.0)
BKR WAM RED BLOOD CELL COUNT.: 2.62 M/ÂµL (ref 4.00–6.00)
BKR WAM WHITE BLOOD CELL COUNT: 0.2 x1000/ÂµL (ref 4.0–11.0)

## 2022-08-12 LAB — COMPREHENSIVE METABOLIC PANEL
BKR A/G RATIO: 1.2 (ref 1.0–2.2)
BKR ALANINE AMINOTRANSFERASE (ALT): 14 U/L (ref 9–59)
BKR ALBUMIN: 3.9 g/dL (ref 3.6–5.1)
BKR ALKALINE PHOSPHATASE: 102 U/L (ref 9–122)
BKR ANION GAP: 14 (ref 7–17)
BKR ASPARTATE AMINOTRANSFERASE (AST): 17 U/L (ref 10–35)
BKR AST/ALT RATIO: 1.2
BKR BILIRUBIN TOTAL: 0.9 mg/dL (ref <=1.2)
BKR BLOOD UREA NITROGEN: 15 mg/dL (ref 8–23)
BKR BUN / CREAT RATIO: 15 (ref 8.0–23.0)
BKR CALCIUM: 8.9 mg/dL (ref 8.8–10.2)
BKR CHLORIDE: 103 mmol/L (ref 98–107)
BKR CO2: 21 mmol/L (ref 20–30)
BKR CREATININE: 1 mg/dL (ref 0.40–1.30)
BKR EGFR, CREATININE (CKD-EPI 2021): >60 mL/min/{1.73_m2} (ref >=60)
BKR GLOBULIN: 3.2 g/dL (ref 2.0–3.9)
BKR GLUCOSE: 129 mg/dL — ABNORMAL HIGH (ref 70–100)
BKR POTASSIUM: 3.8 mmol/L (ref 3.3–5.3)
BKR PROTEIN TOTAL: 7.1 g/dL (ref 5.9–8.3)
BKR SODIUM: 138 mmol/L (ref 136–144)

## 2022-08-12 LAB — IMMATURE PLATELET FRACTION (BH GH YH)
BKR WAM IPF, ABSOLUTE: 0.4 x1000/ÂµL (ref <20.0)
BKR WAM IPF, ABSOLUTE: 1 x1000/ÂµL (ref <20.0)
BKR WAM IPF: 11.7 % — ABNORMAL HIGH (ref 1.2–8.6)
BKR WAM IPF: 2.9 % (ref 1.2–8.6)

## 2022-08-12 LAB — PLATELET COUNT: BKR WAM PLATELETS: 34 x1000/ÂµL — ABNORMAL LOW (ref 150–420)

## 2022-08-12 LAB — LACTATE DEHYDROGENASE: BKR LACTATE DEHYDROGENASE: 244 U/L (ref 122–241)

## 2022-08-12 LAB — FIBRINOGEN     (BH GH L LMW YH): BKR FIBRINOGEN LEVEL: 430 mg/dL (ref 194–448)

## 2022-08-12 LAB — URIC ACID: BKR URIC ACID: 3.1 mg/dL (ref 2.7–7.3)

## 2022-08-12 LAB — PHOSPHORUS     (BH GH L LMW YH): BKR PHOSPHORUS: 2.4 mg/dL (ref 2.2–4.5)

## 2022-08-12 MED ORDER — ACETAMINOPHEN 325 MG TABLET
325 mg | Freq: Once | ORAL | Status: DC
Start: 2022-08-12 — End: 2022-08-17

## 2022-08-12 MED ORDER — DIPHENHYDRAMINE 25 MG CAPSULE
25 mg | Freq: Once | ORAL | Status: DC
Start: 2022-08-12 — End: 2022-08-17

## 2022-08-12 NOTE — Unmapped
Here for labs/supportive care.Wife and patient repost significant fatigue. Used wheelchair to come up to clinic today. Denies any feversor other signs of infection. Pt noted to have diffuse petechiae but denies any active bleeding. He does have mucositis which is painful and makes it difficult to eat.Discussed with Claudia Desanctis, APRN and viscous lidocaine ordered for patient. Instructed patient and wife to continue with the rinses and utilize lidocaine PRN, especially prior to meals.Labs drawn via R DL Hickman and reviewedH/H- 7.1/22, platelets-3->34 and ANC-0.01Confirmed that he takes acyclovir/levaquin/vfend for prophylaxis.Vital signs stable here in clinic; afebrile.Transfused with 1 unit platelets followed by 1 unit PRBCs without incident. No premeds as patient states he never had a reaction but previous clinic in [facility] would standardly premedicate. He states he does not need premeds.Post platelet count 34Hickman flushed and pt discharged with his wife.RTC on SaturdayInstructed to call the emergency number if he feels unwell or has a temperature of 100.55F or greater

## 2022-08-14 ENCOUNTER — Telehealth: Admit: 2022-08-14 | Payer: PRIVATE HEALTH INSURANCE | Attending: Medical Oncology

## 2022-08-14 ENCOUNTER — Encounter: Admit: 2022-08-14 | Payer: PRIVATE HEALTH INSURANCE

## 2022-08-14 NOTE — Unmapped
I received a phone call from Mr. Dewater with his wife and daughter on the phone. We reviewed that 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 We reviewed that the donor would be worked up and collected through [employer]. Once dates are confirmed with the donor we will be able to confirm his transplant date. Once his transplant date is confirmed we will be able to schedule his transplant talk. We reviewed that he would need baseline organ function testing which would consist of lab work, Chest x ray, EKG, ECHO, PFT and Chest/abd/pelvis [address] scan. We reviewed that he would need a repeat BM bx prior to admission. He has a Education officer, community and it was reviewed that he will need a dental clearance prior to coming in for transplant. He will receive a phone call from the social worker to complete a psych social assessment over the phone.  Once his workup testing is scheduled I will give him a call to review. He has my phone number for any further questions [phone].

## 2022-08-14 NOTE — Unmapped
LVM for [name] calling to review his upcoming workup testing appointments on 7/2. I let him know that I have put together a copy of the schedule and will send it to him via mychart. I also asked for him to call me back as we had an update regarding his transplant admission date [phone].

## 2022-08-14 NOTE — Unmapped
I received a phone call back from Douglas [name]. He confirmed his workup testing on Tuesday 7/2 next week. He will plan to come directly to [facility] at 11:15am and ask the clinical secretaries to call Cara. I will plan to meet him and walk him to his PFTs. I also reviewed that I have sent a copy of his workup schedule in Mychart. I reviewed that we have heard back from the donor and have a transplant admission date of August 2nd. I let him know that I have let [name], Dr. Sherrell Puller and Misty Stanley B know the admission date as well. He verbalized understanding and will call me back should he have any other questions [phone].

## 2022-08-15 ENCOUNTER — Inpatient Hospital Stay: Admit: 2022-08-15 | Discharge: 2022-08-15 | Payer: MEDICARE

## 2022-08-15 DIAGNOSIS — R791 Abnormal coagulation profile: Secondary | ICD-10-CM

## 2022-08-15 DIAGNOSIS — C9200 Acute myeloblastic leukemia, not having achieved remission: Secondary | ICD-10-CM

## 2022-08-15 DIAGNOSIS — C931 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: Secondary | ICD-10-CM

## 2022-08-15 LAB — COMPREHENSIVE METABOLIC PANEL
BKR A/G RATIO: 1.1 (ref 1.0–2.2)
BKR ALANINE AMINOTRANSFERASE (ALT): 18 U/L (ref 9–59)
BKR ALBUMIN: 3.9 g/dL (ref 3.6–5.1)
BKR ALKALINE PHOSPHATASE: 103 U/L (ref 9–122)
BKR ANION GAP: 14 (ref 7–17)
BKR ASPARTATE AMINOTRANSFERASE (AST): 19 U/L (ref 10–35)
BKR AST/ALT RATIO: 1.1 % (ref 0.0–1.0)
BKR BILIRUBIN TOTAL: 0.9 mg/dL (ref <=1.2)
BKR BLOOD UREA NITROGEN: 14 mg/dL (ref 8–23)
BKR BUN / CREAT RATIO: 14 (ref 8.0–23.0)
BKR CALCIUM: 8.8 mg/dL (ref 8.8–10.2)
BKR CHLORIDE: 103 mmol/L (ref 98–107)
BKR CO2: 20 mmol/L (ref 20–30)
BKR CREATININE: 1 mg/dL (ref 0.40–1.30)
BKR EGFR, CREATININE (CKD-EPI 2021): >60 mL/min/{1.73_m2} (ref >=60)
BKR GLOBULIN: 3.5 g/dL (ref 2.0–3.9)
BKR GLUCOSE: 99 mg/dL (ref 70–100)
BKR POTASSIUM: 3.9 mmol/L (ref 3.3–5.3)
BKR PROTEIN TOTAL: 7.4 g/dL (ref 5.9–8.3)
BKR SODIUM: 137 mmol/L (ref 136–144)

## 2022-08-15 LAB — CBC WITH AUTO DIFFERENTIAL
BKR WAM ABSOLUTE NRBC (2 DEC): 0 x 1000/ÂµL (ref 0.00–1.00)
BKR WAM ANALYZER ANC: 0 x 1000/ÂµL (ref 2.00–7.60)
BKR WAM BASOPHILS: 3.5 g/dL (ref 2.0–3.9)
BKR WAM EOSINOPHILS: 19 U/L (ref 10–35)
BKR WAM HEMATOCRIT (2 DEC): 22.7 % (ref 38.50–50.00)
BKR WAM HEMOGLOBIN: 7.8 g/dL (ref 13.2–17.1)
BKR WAM IMMATURE GRANULOCYTES: 1.1 (ref 1.0–2.2)
BKR WAM LYMPHOCYTES: 103 U/L (ref 9–122)
BKR WAM MCH (PG): 28.1 pg (ref 27.0–33.0)
BKR WAM MCHC: 34.4 g/dL (ref 31.0–36.0)
BKR WAM MCV: 81.7 fL (ref 80.0–100.0)
BKR WAM MONOCYTES: 18 U/L (ref 9–59)
BKR WAM MPV: 3.9 g/dL (ref 3.6–5.1)
BKR WAM NEUTROPHILS: 0.9 mg/dL (ref <=1.2)
BKR WAM NUCLEATED RED BLOOD CELLS: 0 % (ref 0.0–1.0)
BKR WAM PLATELETS: 14 x1000/ÂµL (ref 150–420)
BKR WAM RDW-CV: 15.8 % (ref 11.0–15.0)
BKR WAM RED BLOOD CELL COUNT.: 2.78 M/ÂµL (ref 4.00–6.00)
BKR WAM WHITE BLOOD CELL COUNT: 0.2 x1000/ÂµL (ref 4.0–11.0)

## 2022-08-15 LAB — PT/INR AND PTT (BH GH L LMW YH)
BKR INR: 1.11 (ref 0.86–1.12)
BKR PARTIAL THROMBOPLASTIN TIME: 30.1 s (ref 23.0–31.4)
BKR PROTHROMBIN TIME: 12.2 s (ref 9.6–12.3)

## 2022-08-15 LAB — FIBRINOGEN     (BH GH L LMW YH): BKR FIBRINOGEN LEVEL: 456 mg/dL — ABNORMAL HIGH (ref 194–448)

## 2022-08-15 LAB — IMMATURE PLATELET FRACTION (BH GH YH)
BKR WAM IPF, ABSOLUTE: 0.9 x1000/ÂµL (ref <20.0)
BKR WAM IPF: 6.7 % (ref 1.2–8.6)

## 2022-08-15 LAB — URIC ACID: BKR URIC ACID: 2.8 mg/dL (ref 2.7–7.3)

## 2022-08-15 LAB — PHOSPHORUS     (BH GH L LMW YH): BKR PHOSPHORUS: 2.3 mg/dL (ref 2.2–4.5)

## 2022-08-15 LAB — LACTATE DEHYDROGENASE: BKR LACTATE DEHYDROGENASE: 293 U/L — ABNORMAL HIGH (ref 122–241)

## 2022-08-15 NOTE — Unmapped
Here for labs/supportive care.S/p cycle 2 of ven + decitabine IV( 6/4 to 6/8).Has ongoing mucositis which is painful and makes it difficult for him to eat and drink.Denies active bleeding from his mouth-uses viscous lidocaine and saline rinses, with minimal results.Reports fatigue and weakness; no fever/chills.Skin intact; has trace edema on his ankles.RDLH dressing and claves changed.Labs drawn-H/H-7.8/22.7,platelet-14 and ANC-0.No premeds needed for transfusions.Tolerated a bag of platelet and a bag of red cells without issues.Hickman flushed and patient discharged with his wife.Has a schedule on July 2 to come to clinic.Neutropenic precautions reinforced.

## 2022-08-18 ENCOUNTER — Inpatient Hospital Stay: Admit: 2022-08-18 | Discharge: 2022-08-18 | Payer: MEDICARE

## 2022-08-18 ENCOUNTER — Encounter: Admit: 2022-08-18 | Payer: PRIVATE HEALTH INSURANCE

## 2022-08-18 DIAGNOSIS — C9110 Chronic lymphocytic leukemia of B-cell type not having achieved remission: Secondary | ICD-10-CM

## 2022-08-18 DIAGNOSIS — Z01818 Encounter for other preprocedural examination: Secondary | ICD-10-CM

## 2022-08-18 NOTE — Unmapped
SOCIAL WORK NOTEPatient Name: Dez Stachurski BennettMedical Record Number: [id number] Date of Birth: February 28, 1952Medical Social Work Follow Up  [employer] Most Recent Value Admission Information  Document Type Progress Note Reason for Current Social Work Involvement Resources Source of Information Patient Record Reviewed Yes Level of Care Ambulatory What medium(s) of communication were used with patient/family/caregiver? Face-to-Face / In-Person Psychosocial issues requiring intervention Suit[facility] Psychosocial interventions 20 minutes face to face and by phone with wife, Okey Regal. I assisted her with sending the 2023 tax return to Iris Calog[employer] of [facility] financial department for the discount on [facility] medical bills. I also discussed Carol's hoped for upcoming stay at the [facility] Suit[facility] 8/30-- 11/16/22 and the potential funding that might come in for funding from [employer] in July.She said she will check back with me at the end of July and thanked me for the help.  Collaborations hematology Specific referrals to enhance community supports (include existing and new resources) above Handoff Required? No Next Steps/Plan (including hand-off): Social work intervention complete. Please reconsult social work if needed. Signature: Huntley Estelle, LCSW Contact Information: [phone]

## 2022-08-19 ENCOUNTER — Inpatient Hospital Stay: Admit: 2022-08-19 | Discharge: 2022-08-19 | Payer: MEDICARE

## 2022-08-19 ENCOUNTER — Ambulatory Visit: Admit: 2022-08-19 | Payer: MEDICARE | Attending: Hematology

## 2022-08-19 DIAGNOSIS — C931 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: Secondary | ICD-10-CM

## 2022-08-19 DIAGNOSIS — R791 Abnormal coagulation profile: Secondary | ICD-10-CM

## 2022-08-19 DIAGNOSIS — C9200 Acute myeloblastic leukemia, not having achieved remission: Secondary | ICD-10-CM

## 2022-08-19 LAB — COMPREHENSIVE METABOLIC PANEL
BKR A/G RATIO: 1.2 (ref 1.0–2.2)
BKR ALANINE AMINOTRANSFERASE (ALT): 17 U/L (ref 9–59)
BKR ALBUMIN: 3.9 g/dL (ref 3.6–5.1)
BKR ALKALINE PHOSPHATASE: 101 U/L (ref 9–122)
BKR ANION GAP: 12 % (ref 7–17)
BKR ASPARTATE AMINOTRANSFERASE (AST): 21 U/L (ref 10–35)
BKR AST/ALT RATIO: 1.2
BKR BILIRUBIN TOTAL: 0.6 mg/dL (ref <=1.2)
BKR BLOOD UREA NITROGEN: 14 mg/dL (ref 8–23)
BKR BUN / CREAT RATIO: 13.1 % (ref 8.0–23.0)
BKR CALCIUM: 8.5 mg/dL (ref 8.8–10.2)
BKR CHLORIDE: 103 mmol/L (ref 98–107)
BKR CO2: 22 mmol/L (ref 20–30)
BKR CREATININE: 1.07 mg/dL (ref 0.40–1.30)
BKR EGFR, CREATININE (CKD-EPI 2021): >60 mL/min/{1.73_m2} (ref >=60–7.60)
BKR GLOBULIN: 3.3 g/dL (ref 2.0–3.9)
BKR GLUCOSE: 102 mg/dL (ref 70–100)
BKR POTASSIUM: 3.8 mmol/L (ref 3.3–5.3)
BKR PROTEIN TOTAL: 7.2 g/dL (ref 5.9–8.3)
BKR SODIUM: 137 mmol/L (ref 136–144)

## 2022-08-19 LAB — CBC WITH AUTO DIFFERENTIAL
BKR WAM ABSOLUTE NRBC (2 DEC): 0 x 1000/ÂµL (ref 0.00–1.00)
BKR WAM ANALYZER ANC: 0.02 x 1000/ÂµL — ABNORMAL LOW (ref 2.00–7.60)
BKR WAM HEMATOCRIT (2 DEC): 23.7 % — ABNORMAL LOW (ref 38.50–50.00)
BKR WAM HEMOGLOBIN: 8 g/dL — ABNORMAL LOW (ref 13.2–17.1)
BKR WAM MCH (PG): 28 pg (ref 27.0–33.0)
BKR WAM MCHC: 33.8 g/dL (ref 31.0–36.0)
BKR WAM MCV: 82.9 fL (ref 80.0–100.0)
BKR WAM NUCLEATED RED BLOOD CELLS: 0 % (ref 0.0–1.0)
BKR WAM PLATELETS: 19 x1000/ÂµL — ABNORMAL LOW (ref 150–420)
BKR WAM RDW-CV: 15.3 % — ABNORMAL HIGH (ref 11.0–15.0)
BKR WAM RED BLOOD CELL COUNT.: 2.86 M/ÂµL — ABNORMAL LOW (ref 4.00–6.00)
BKR WAM WHITE BLOOD CELL COUNT: 0.2 x1000/ÂµL — CL (ref 4.0–11.0)

## 2022-08-19 LAB — FIBRINOGEN     (BH GH L LMW YH): BKR FIBRINOGEN LEVEL: 424 mg/dL (ref 194–448)

## 2022-08-19 LAB — IMMATURE PLATELET FRACTION (BH GH LMW YH)
BKR WAM IPF, ABSOLUTE: 1.9 x1000/ÂµL (ref <20.0)
BKR WAM IPF: 9.9 % — ABNORMAL HIGH (ref 1.2–8.6)

## 2022-08-19 LAB — PT/INR AND PTT (BH GH L LMW YH)
BKR INR: 1.13 (ref 0.86–1.12)
BKR PARTIAL THROMBOPLASTIN TIME: 29.6 s (ref 23.0–31.4)
BKR PROTHROMBIN TIME: 12.4 s (ref 9.6–12.3)

## 2022-08-19 LAB — URIC ACID: BKR URIC ACID: 3 mg/dL (ref 2.7–7.3)

## 2022-08-19 LAB — LACTATE DEHYDROGENASE: BKR LACTATE DEHYDROGENASE: 275 U/L (ref 122–241)

## 2022-08-19 LAB — PHOSPHORUS     (BH GH L LMW YH): BKR PHOSPHORUS: 1.8 mg/dL (ref 2.2–4.5)

## 2022-08-19 MED ORDER — ALBUTEROL SULFATE 2.5 MG/3 ML (0.083 %) SOLUTION FOR NEBULIZATION
2.5 mg /3 mL (0.083 %) | RESPIRATORY_TRACT | Status: DC | PRN
Start: 2022-08-19 — End: 2022-08-24

## 2022-08-19 MED ORDER — HYDROCORTISONE SODIUM SUCCINATE 100 MG SOLUTION FOR INJ
Freq: Once | INTRAVENOUS | Status: DC | PRN
Start: 2022-08-19 — End: 2022-08-24

## 2022-08-19 MED ORDER — DIPHENHYDRAMINE 50 MG/ML INJECTION (WRAPPED E-RX)
50 mg/mL | Freq: Once | INTRAVENOUS | Status: DC | PRN
Start: 2022-08-19 — End: 2022-08-24

## 2022-08-19 MED ORDER — FAMOTIDINE 4 MG/ML IN 0.9% SODIUM CHLORIDE (ADULT)
Freq: Once | INTRAVENOUS | Status: DC | PRN
Start: 2022-08-19 — End: 2022-08-24

## 2022-08-19 MED ORDER — EPINEPHRINE 0.3 MG/0.3 ML INJECTION, AUTO-INJECTOR
0.3 mg/ mL | INTRAMUSCULAR | Status: DC | PRN
Start: 2022-08-19 — End: 2022-08-24

## 2022-08-19 MED ORDER — SODIUM CHLORIDE 0.9 % BOLUS (NEW BAG)
0.9 % | Freq: Once | INTRAVENOUS | Status: DC | PRN
Start: 2022-08-19 — End: 2022-08-24

## 2022-08-19 MED ORDER — TRAMADOL 50 MG TABLET
50 mg | ORAL_TABLET | Freq: Four times a day (QID) | ORAL | 1 refills | Status: AC | PRN
Start: 2022-08-19 — End: ?

## 2022-08-19 NOTE — Unmapped
Douglas [name] arrives to clinic with his supportive wife for labs poss and consideration of C3D1 DecitabineHickman c/d/I, labs drawn and sentPt reports that his mouth is very sore, redness noted under tongue and along gum line. He states it has been very hard to eat and drink, able to tolerate ice cold juice and applesauce in clinic todayHe also states that he used to feel better after getting a transfusion, however now he says he feels the same before and after transfusionsWBC: 0.2HGB: 8.0PLT: 19ANC: 0.02RBCs and plts indicated today. No pre meds. Tolerated both transfusions well. Seen by APRN, plan to HOLD C3. Checkout note written for labs poss Wed/Sat through the end of August. Line flushed, he will RTC on Saturday for labs poss

## 2022-08-21 ENCOUNTER — Encounter: Admit: 2022-08-21 | Payer: PRIVATE HEALTH INSURANCE

## 2022-08-21 ENCOUNTER — Telehealth: Admit: 2022-08-21 | Payer: PRIVATE HEALTH INSURANCE

## 2022-08-21 ENCOUNTER — Ambulatory Visit: Admit: 2022-08-21 | Payer: MEDICARE

## 2022-08-21 MED ORDER — LIDOCAINE VISCOUS 2 % MUCOSAL SOLUTION
2 | Status: AC
Start: 2022-08-21 — End: ?

## 2022-08-21 NOTE — Telephone Encounter
Called pt, spoke w/pt and pt's wife and 08/25/2022 COLON/EGD appt has been R/S, Per provider, July 9th is too soon for patients low blood counts, requesting scheduling of endoscopy approximately 2 weeks after (eg July 23 or so).  The new date is 09/09/2022 w/Dr. Elnoria Howard at [facility].  Per pt's wife pt has bowel prep RX.  Updated appt/prep instructions have been sent to pt via my chart.

## 2022-08-21 NOTE — Telephone Encounter
-----   Message from Annamary Carolin, RN sent at 08/21/2022  9:56 AM EDT -----Regarding: Reschedule EGD after July 23rdPt is currently scheduled with Dr Sheran Luz at [facility]. Adding Elon Jester and Tamela Oddi to assist with rescheduling patient in hospital setting for after July 23rd, with [facility] general GI.Azucena Kuba----- Message -----From: Leata Mouse, MDSent: 08/19/2022   7:18 PM EDTTo: Vickie Epley, MD; Annamary Carolin, RN; #Subject: FW: scope                                    Please see update from bone marrow transplant team--- Unfortunately current endoscopy date of July 9th is too soon for patients low blood counts. They are requesting scheduling of endoscopy approximately 2 weeks after (eg July 23 or so).   Please reschedule with general GI. ----- Message -----From: Chrystine Oiler, MDSent: 08/19/2022   4:53 PM EDTTo: Vickie Epley, MD; Larwance Rote II, APRN; #Subject: scope                                        Hi Burnett Sheng pt is on track for a stem cell transplant but has a recent history of iron deficiency for which a scope was planned to exclude a bowel lesion. Unfortunately his count recovery from chemo requires a delay and apparently leukemia team was told no scope slots until September. I can't tell who he was scheduled with, any way you can help. Sounds like 7/9 is too soon. We were looking for one or two weeks later. Clarita Crane is leukemia doc copied here.ThanksStuart----- Message -----FromEra Bumpers, TracySent: 08/19/2022   4:33 PM EDTTo: Larwance Rote II, APRN; Vernelle Emerald, MD; #Hi Lisa,I called DD. The next available colonoscopy is on September 11th. I left the 7/9, should I cancel/reschedule all the way out in September?Please adviseThanks,Tracy----- Message -----From: Rozanna Box., APRNSent: 08/19/2022   9:29 AM EDTTo: Larwance Rote II, APRN; Vernelle Emerald, MD; #Hi-Mr. Luttrull is scheduled for a colonoscopy next week-we will need to reschedule for 2-3 weeks from now, can you please help with this? Thx, lisa

## 2022-08-21 NOTE — Telephone Encounter
First attempt call made to conduct pre-procedure call for upper gi endoscopy on 08/25/2022 at [facility]. No patient contact made. Left initial voice message to please return the call to the GI Navigation Department.

## 2022-08-22 ENCOUNTER — Inpatient Hospital Stay: Admit: 2022-08-22 | Discharge: 2022-08-22 | Payer: MEDICARE

## 2022-08-22 ENCOUNTER — Encounter: Admit: 2022-08-22 | Payer: PRIVATE HEALTH INSURANCE

## 2022-08-22 DIAGNOSIS — E782 Mixed hyperlipidemia: Secondary | ICD-10-CM

## 2022-08-22 DIAGNOSIS — C4491 Basal cell carcinoma of skin, unspecified: Secondary | ICD-10-CM

## 2022-08-22 DIAGNOSIS — C9110 Chronic lymphocytic leukemia of B-cell type not having achieved remission: Secondary | ICD-10-CM

## 2022-08-22 DIAGNOSIS — R0609 Other forms of dyspnea: Secondary | ICD-10-CM

## 2022-08-22 DIAGNOSIS — N400 Enlarged prostate without lower urinary tract symptoms: Secondary | ICD-10-CM

## 2022-08-22 DIAGNOSIS — Z8249 Family history of ischemic heart disease and other diseases of the circulatory system: Secondary | ICD-10-CM

## 2022-08-22 DIAGNOSIS — F32A Depression: Secondary | ICD-10-CM

## 2022-08-22 DIAGNOSIS — I119 Hypertensive heart disease without heart failure: Secondary | ICD-10-CM

## 2022-08-22 DIAGNOSIS — C931 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: Secondary | ICD-10-CM

## 2022-08-22 DIAGNOSIS — C9200 Acute myeloblastic leukemia, not having achieved remission: Secondary | ICD-10-CM

## 2022-08-22 DIAGNOSIS — H409 Unspecified glaucoma: Secondary | ICD-10-CM

## 2022-08-22 DIAGNOSIS — F419 Anxiety disorder, unspecified: Secondary | ICD-10-CM

## 2022-08-22 DIAGNOSIS — I10 Essential (primary) hypertension: Secondary | ICD-10-CM

## 2022-08-22 LAB — CBC WITH AUTO DIFFERENTIAL
BKR WAM ABSOLUTE NRBC (2 DEC): 0 x 1000/ÂµL (ref 0.00–1.00)
BKR WAM ANALYZER ANC: 0.04 x 1000/ÂµL — ABNORMAL LOW (ref 2.00–7.60)
BKR WAM HEMATOCRIT (2 DEC): 26.5 % — ABNORMAL LOW (ref 38.50–50.00)
BKR WAM HEMOGLOBIN: 9 g/dL — ABNORMAL LOW (ref 13.2–17.1)
BKR WAM LYMPHOCYTES: 112 U/L (ref 9–122)
BKR WAM MCH (PG): 28.1 pg (ref 27.0–33.0)
BKR WAM MCHC: 34 g/dL (ref 31.0–36.0)
BKR WAM MCV: 82.8 fL (ref 80.0–100.0)
BKR WAM MONOCYTES: 24 U/L (ref 9–59)
BKR WAM NUCLEATED RED BLOOD CELLS: 0 % (ref 0.0–1.0)
BKR WAM PLATELETS: 19 x1000/ÂµL — ABNORMAL LOW (ref 150–420)
BKR WAM RDW-CV: 14.9 % (ref 11.0–15.0)
BKR WAM RED BLOOD CELL COUNT.: 3.2 M/ÂµL — ABNORMAL LOW (ref 4.00–6.00)
BKR WAM WHITE BLOOD CELL COUNT: 0.3 x1000/ÂµL — CL (ref 4.0–11.0)

## 2022-08-22 LAB — COMPREHENSIVE METABOLIC PANEL
BKR A/G RATIO: 1 (ref 1.0–2.2)
BKR ALANINE AMINOTRANSFERASE (ALT): 24 U/L (ref 9–59)
BKR ALBUMIN: 4 g/dL (ref 3.6–5.1)
BKR ALKALINE PHOSPHATASE: 112 U/L (ref 9–122)
BKR ANION GAP: 12 (ref 7–17)
BKR ASPARTATE AMINOTRANSFERASE (AST): 22 U/L (ref 10–35)
BKR AST/ALT RATIO: 0.9 % (ref 0.0–1.0)
BKR BILIRUBIN TOTAL: 0.5 mg/dL (ref <=1.2)
BKR BLOOD UREA NITROGEN: 13 mg/dL (ref 8–23)
BKR BUN / CREAT RATIO: 12.3 % (ref 8.0–23.0)
BKR CALCIUM: 9 mg/dL (ref 8.8–10.2)
BKR CHLORIDE: 105 mmol/L (ref 98–107)
BKR CO2: 21 mmol/L (ref 20–30)
BKR CREATININE: 1.06 mg/dL (ref 0.40–1.30)
BKR EGFR, CREATININE (CKD-EPI 2021): >60 mL/min/{1.73_m2} — ABNORMAL LOW (ref >=60–7.60)
BKR GLOBULIN: 3.9 g/dL (ref 2.0–3.9)
BKR GLUCOSE: 119 mg/dL — ABNORMAL HIGH (ref 70–100)
BKR POTASSIUM: 3.8 mmol/L (ref 3.3–5.3)
BKR PROTEIN TOTAL: 7.9 g/dL — ABNORMAL LOW (ref 5.9–8.3)
BKR SODIUM: 138 mmol/L (ref 136–144)

## 2022-08-22 LAB — IMMATURE PLATELET FRACTION (BH GH LMW YH)
BKR WAM IPF, ABSOLUTE: 0.8 x1000/ÂµL (ref <20.0)
BKR WAM IPF: 4 % (ref 1.2–8.6)

## 2022-08-22 NOTE — Progress Notes
Patient arrived to [facility] for labs/poss. Pt reports no changes to current symptoms other than mouth sores a little better. He's continues to have difficulty with eating. Red catheter to hickman accessed with + BR. Labs obtained. Hgb 9.0. Platelet count 19 K.. Pt received 1 unit of platelets. Tolerated well. VSS. Dressing changed to Hickman. New valves and caps applied. RTC on 7/13. Pt d/c clinic at baseline with wife.

## 2022-08-23 ENCOUNTER — Ambulatory Visit: Admit: 2022-08-23 | Payer: MEDICARE

## 2022-08-24 ENCOUNTER — Inpatient Hospital Stay: Admit: 2022-08-24 | Discharge: 2022-08-24 | Payer: MEDICARE

## 2022-08-24 ENCOUNTER — Ambulatory Visit: Admit: 2022-08-24 | Payer: MEDICARE

## 2022-08-24 DIAGNOSIS — C9200 Acute myeloblastic leukemia, not having achieved remission: Secondary | ICD-10-CM

## 2022-08-24 MED ORDER — GADOTERATE MEGLUMINE 0.5 MMOL/ML (376.9 MG/ML) INTRAVENOUS SOLUTION
0.5 | Freq: Once | INTRAVENOUS | Status: CP | PRN
Start: 2022-08-24 — End: ?
  Administered 2022-08-24: 18:00:00 0.5 mL via INTRAVENOUS

## 2022-08-25 ENCOUNTER — Encounter: Admit: 2022-08-25 | Payer: MEDICARE

## 2022-08-25 MED FILL — VORICONAZOLE 200 MG TABLET: 200 mg | ORAL | 15 days supply | Qty: 30 | Fill #4 | Status: CP

## 2022-08-26 ENCOUNTER — Encounter: Admit: 2022-08-26 | Payer: PRIVATE HEALTH INSURANCE | Attending: Family

## 2022-08-26 ENCOUNTER — Inpatient Hospital Stay: Admit: 2022-08-26 | Discharge: 2022-08-26 | Payer: MEDICARE

## 2022-08-26 DIAGNOSIS — C9200 Acute myeloblastic leukemia, not having achieved remission: Secondary | ICD-10-CM

## 2022-08-26 DIAGNOSIS — C9110 Chronic lymphocytic leukemia of B-cell type not having achieved remission: Secondary | ICD-10-CM

## 2022-08-26 DIAGNOSIS — C931 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: Secondary | ICD-10-CM

## 2022-08-26 LAB — COMPREHENSIVE METABOLIC PANEL
BKR A/G RATIO: 1 (ref 1.0–2.2)
BKR ALANINE AMINOTRANSFERASE (ALT): 25 U/L (ref 9–59)
BKR ALBUMIN: 3.8 g/dL (ref 3.6–5.1)
BKR ALKALINE PHOSPHATASE: 116 U/L (ref 9–122)
BKR ANION GAP: 14 g/dL — ABNORMAL LOW (ref 7–17)
BKR ASPARTATE AMINOTRANSFERASE (AST): 25 U/L (ref 10–35)
BKR AST/ALT RATIO: 1 % (ref 0.0–1.0)
BKR BILIRUBIN TOTAL: 0.6 mg/dL (ref <=1.2)
BKR BLOOD UREA NITROGEN: 15 mg/dL (ref 8–23)
BKR BUN / CREAT RATIO: 14.2 (ref 8.0–23.0)
BKR CALCIUM: 9.1 mg/dL (ref 8.8–10.2)
BKR CHLORIDE: 107 mmol/L (ref 98–107)
BKR CO2: 19 mmol/L — ABNORMAL LOW (ref 20–30)
BKR CREATININE: 1.06 mg/dL (ref 0.40–1.30)
BKR EGFR, CREATININE (CKD-EPI 2021): >60 mL/min/{1.73_m2} (ref >=60)
BKR GLOBULIN: 3.8 g/dL (ref 2.0–3.9)
BKR GLUCOSE: 117 mg/dL — ABNORMAL HIGH (ref 70–100)
BKR POTASSIUM: 4 mmol/L (ref 3.3–5.3)
BKR PROTEIN TOTAL: 7.6 g/dL (ref 5.9–8.3)
BKR SODIUM: 140 mmol/L (ref 136–144)

## 2022-08-26 LAB — PHOSPHORUS     (BH GH L LMW YH): BKR PHOSPHORUS: 2 mg/dL — ABNORMAL LOW (ref 2.2–4.5)

## 2022-08-26 LAB — CBC WITH AUTO DIFFERENTIAL
BKR WAM ABSOLUTE NRBC (2 DEC): 0 x 1000/ÂµL (ref 0.00–1.00)
BKR WAM ANC (ABSOLUTE NEUTROPHIL COUNT): 0.04 x 1000/ÂµL — ABNORMAL LOW (ref 2.00–7.60)
BKR WAM EOSINOPHILS: 25 U/L (ref 10–35)
BKR WAM HEMATOCRIT (2 DEC): 24.8 % — ABNORMAL LOW (ref 38.50–50.00)
BKR WAM HEMOGLOBIN: 8.2 g/dL — ABNORMAL LOW (ref 13.2–17.1)
BKR WAM LYMPHOCYTES: 116 U/L (ref 9–122)
BKR WAM MCH (PG): 27.2 pg (ref 27.0–33.0)
BKR WAM MCHC: 33.1 g/dL (ref 31.0–36.0)
BKR WAM MCV: 82.4 fL (ref 80.0–100.0)
BKR WAM NUCLEATED RED BLOOD CELLS: 0 % (ref 0.0–1.0)
BKR WAM PLATELETS: 8 x1000/ÂµL — CL (ref 150–420)
BKR WAM RDW-CV: 14.8 % (ref 11.0–15.0)
BKR WAM RED BLOOD CELL COUNT.: 3.01 M/ÂµL — ABNORMAL LOW (ref 4.00–6.00)
BKR WAM WHITE BLOOD CELL COUNT: 0.4 x1000/ÂµL — ABNORMAL LOW (ref 4.0–11.0)

## 2022-08-26 LAB — LACTATE DEHYDROGENASE: BKR LACTATE DEHYDROGENASE: 286 U/L — ABNORMAL HIGH (ref 122–241)

## 2022-08-26 LAB — IMMATURE PLATELET FRACTION (BH GH LMW YH)
BKR WAM IPF, ABSOLUTE: 0.3 x1000/ÂµL (ref <20.0)
BKR WAM IPF, ABSOLUTE: 0.5 x1000/ÂµL (ref <20.0)
BKR WAM IPF: 4.2 % (ref 1.2–8.6)
BKR WAM IPF: 3.3 % (ref 1.2–8.6)

## 2022-08-26 LAB — URIC ACID: BKR URIC ACID: 3.1 mg/dL (ref 2.7–7.3)

## 2022-08-26 LAB — PLATELET COUNT: BKR WAM PLATELETS: 16 x1000/ÂµL — ABNORMAL LOW (ref 150–420)

## 2022-08-26 MED ORDER — FAMOTIDINE 4 MG/ML IN 0.9% SODIUM CHLORIDE (ADULT)
Freq: Once | INTRAVENOUS | Status: AC | PRN
Start: 2022-08-26 — End: 2022-08-31

## 2022-08-26 MED ORDER — EPINEPHRINE 0.3 MG/0.3 ML INJECTION, AUTO-INJECTOR
0.30.3 mg/ mL | INTRAMUSCULAR | Status: AC | PRN
Start: 2022-08-26 — End: 2022-08-31

## 2022-08-26 MED ORDER — SODIUM CHLORIDE 0.9 % BOLUS (NEW BAG)
0.9 % | Freq: Once | INTRAVENOUS | Status: AC | PRN
Start: 2022-08-26 — End: 2022-08-31

## 2022-08-26 MED ORDER — DIPHENHYDRAMINE 50 MG/ML INJECTION (WRAPPED E-RX)
50 mg/mL | Freq: Once | INTRAVENOUS | Status: AC | PRN
Start: 2022-08-26 — End: 2022-08-31

## 2022-08-26 MED ORDER — ALBUTEROL SULFATE 2.5 MG/3 ML (0.083 %) SOLUTION FOR NEBULIZATION
2.530.083 mg /3 mL (0.083 %) | RESPIRATORY_TRACT | Status: AC | PRN
Start: 2022-08-26 — End: 2022-08-31

## 2022-08-26 MED ORDER — EPINEPHRINE 0.3 MG/0.3 ML INJECTION, AUTO-INJECTOR
0.3 mg/ mL | INTRAMUSCULAR | Status: AC | PRN
Start: 2022-08-26 — End: 2022-08-31

## 2022-08-26 MED ORDER — HYDROCORTISONE SOD SUCCINATE (PF) 100 MG/2 ML SOLUTION FOR INJECTION
1002 mg/2 mL | Freq: Once | INTRAVENOUS | Status: AC | PRN
Start: 2022-08-26 — End: 2022-08-31

## 2022-08-26 NOTE — Progress Notes
Damarious arrives today for labs poss with his supportive wifeHe reports feeling fatigued and SOB, weakHe also states he had a little bit of blood from his nose after blowing itHe reports that his mucositis is improvingHickman c/d/I, labs drawn and sentWBC: 0.4HGB: 8.2PLT: 8ANC: 0.04Plts indicated today. No pre meds. Post count drawn and sent. Pt and wife requested to review MRI results, APRN notified. She will review results with Drs. Lenard Forth and Sherrell Puller before speaking with pt; pt and wife aware and in agreement. Post count = 16. Pt initially hesitant to stay d/t long drive home, however amendable to second bag after discussion with is wife. Second bag transfused without incident. He will RTC on Saturday for labs poss

## 2022-08-27 ENCOUNTER — Encounter: Admit: 2022-08-27 | Payer: PRIVATE HEALTH INSURANCE

## 2022-08-29 ENCOUNTER — Encounter: Admit: 2022-08-29 | Payer: PRIVATE HEALTH INSURANCE

## 2022-08-29 ENCOUNTER — Inpatient Hospital Stay: Admit: 2022-08-29 | Discharge: 2022-08-29 | Payer: MEDICARE

## 2022-08-29 DIAGNOSIS — Z8249 Family history of ischemic heart disease and other diseases of the circulatory system: Secondary | ICD-10-CM

## 2022-08-29 DIAGNOSIS — F419 Anxiety disorder, unspecified: Secondary | ICD-10-CM

## 2022-08-29 DIAGNOSIS — C931 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: Secondary | ICD-10-CM

## 2022-08-29 DIAGNOSIS — H409 Unspecified glaucoma: Secondary | ICD-10-CM

## 2022-08-29 DIAGNOSIS — I119 Hypertensive heart disease without heart failure: Secondary | ICD-10-CM

## 2022-08-29 DIAGNOSIS — N400 Enlarged prostate without lower urinary tract symptoms: Secondary | ICD-10-CM

## 2022-08-29 DIAGNOSIS — C9200 Acute myeloblastic leukemia, not having achieved remission: Secondary | ICD-10-CM

## 2022-08-29 DIAGNOSIS — C9110 Chronic lymphocytic leukemia of B-cell type not having achieved remission: Secondary | ICD-10-CM

## 2022-08-29 DIAGNOSIS — R0609 Other forms of dyspnea: Secondary | ICD-10-CM

## 2022-08-29 DIAGNOSIS — C4491 Basal cell carcinoma of skin, unspecified: Secondary | ICD-10-CM

## 2022-08-29 DIAGNOSIS — F32A Depression: Secondary | ICD-10-CM

## 2022-08-29 DIAGNOSIS — E782 Mixed hyperlipidemia: Secondary | ICD-10-CM

## 2022-08-29 DIAGNOSIS — I10 Essential (primary) hypertension: Secondary | ICD-10-CM

## 2022-08-29 LAB — COMPREHENSIVE METABOLIC PANEL
BKR A/G RATIO: 1.1 (ref 1.0–2.2)
BKR ALANINE AMINOTRANSFERASE (ALT): 23 U/L (ref 9–59)
BKR ALBUMIN: 3.9 g/dL (ref 3.6–5.1)
BKR ALKALINE PHOSPHATASE: 111 U/L (ref 9–122)
BKR ANION GAP: 11 g/dL (ref 7–17)
BKR ASPARTATE AMINOTRANSFERASE (AST): 21 U/L (ref 10–35)
BKR AST/ALT RATIO: 0.9
BKR BILIRUBIN TOTAL: 0.6 mg/dL (ref <=1.2)
BKR BLOOD UREA NITROGEN: 14 mg/dL (ref 8–23)
BKR BUN / CREAT RATIO: 13.9 (ref 8.0–23.0)
BKR CALCIUM: 9 mg/dL (ref 8.8–10.2)
BKR CHLORIDE: 107 mmol/L (ref 98–107)
BKR CO2: 20 mmol/L (ref 20–30)
BKR CREATININE: 1.01 mg/dL (ref 0.40–1.30)
BKR EGFR, CREATININE (CKD-EPI 2021): >60 mL/min/{1.73_m2} (ref >=60)
BKR GLOBULIN: 3.5 g/dL (ref 2.0–3.9)
BKR GLUCOSE: 105 mg/dL — ABNORMAL HIGH (ref 70–100)
BKR POTASSIUM: 4.1 mmol/L — ABNORMAL LOW (ref 3.3–5.3)
BKR PROTEIN TOTAL: 7.4 g/dL (ref 5.9–8.3)
BKR SODIUM: 138 mmol/L — ABNORMAL LOW (ref 136–144)

## 2022-08-29 LAB — CBC WITH AUTO DIFFERENTIAL
BKR WAM ABSOLUTE NRBC (2 DEC): 0 x 1000/ÂµL (ref 0.00–1.00)
BKR WAM ANC (ABSOLUTE NEUTROPHIL COUNT): 0.04 x 1000/ÂµL — ABNORMAL LOW (ref 2.00–7.60)
BKR WAM BASOPHILS: 0.6 mg/dL (ref <=1.2)
BKR WAM EOSINOPHIL ABSOLUTE COUNT.: 1.1 (ref 1.0–2.2)
BKR WAM EOSINOPHILS: 3.9 g/dL (ref 3.6–5.1)
BKR WAM HEMATOCRIT (2 DEC): 23.2 % — ABNORMAL LOW (ref 38.50–50.00)
BKR WAM HEMOGLOBIN: 7.7 g/dL — ABNORMAL LOW (ref 13.2–17.1)
BKR WAM MCH (PG): 27.5 pg (ref 27.0–33.0)
BKR WAM MCHC: 33.2 g/dL (ref 31.0–36.0)
BKR WAM MCV: 82.9 fL (ref 80.0–100.0)
BKR WAM MONOCYTE ABSOLUTE COUNT.: 3.5 g/dL (ref 2.0–3.9)
BKR WAM MONOCYTES: 7.4 g/dL (ref 5.9–8.3)
BKR WAM NUCLEATED RED BLOOD CELLS: 0 % (ref 0.0–1.0)
BKR WAM PLATELETS: 18 x1000/ÂµL — ABNORMAL LOW (ref 150–420)
BKR WAM RDW-CV: 14.7 % (ref 11.0–15.0)
BKR WAM RED BLOOD CELL COUNT.: 2.8 M/ÂµL — ABNORMAL LOW (ref 4.00–6.00)
BKR WAM WHITE BLOOD CELL COUNT: 0.4 x1000/ÂµL — ABNORMAL LOW (ref 4.0–11.0)

## 2022-08-29 LAB — LACTATE DEHYDROGENASE: BKR LACTATE DEHYDROGENASE: 294 U/L — ABNORMAL HIGH (ref 122–241)

## 2022-08-29 LAB — IMMATURE PLATELET FRACTION (BH GH LMW YH)
BKR WAM IPF, ABSOLUTE: 0.2 x1000/ÂµL (ref <20.0)
BKR WAM IPF: 1.3 % (ref 1.2–8.6)

## 2022-08-29 LAB — PHOSPHORUS     (BH GH L LMW YH): BKR PHOSPHORUS: 2 mg/dL — ABNORMAL LOW (ref 2.2–4.5)

## 2022-08-29 LAB — URIC ACID: BKR URIC ACID: 3.1 mg/dL (ref 2.7–7.3)

## 2022-08-29 MED ORDER — HYDROCORTISONE SOD SUCCINATE (PF) 100 MG/2 ML SOLUTION FOR INJECTION
1002 mg/2 mL | Freq: Once | INTRAVENOUS | Status: DC | PRN
Start: 2022-08-29 — End: 2022-09-03

## 2022-08-29 MED ORDER — SODIUM CHLORIDE 0.9 % INTRAVENOUS SOLUTION
Freq: Once | INTRAVENOUS | Status: DC | PRN
Start: 2022-08-29 — End: 2022-09-03

## 2022-08-29 MED ORDER — HEPARIN, PORCINE (PF) 100 UNIT/ML IN 0.9% SODIUM CHLORIDE IV SYRINGE
100 | Freq: Once | Status: DC | PRN
Start: 2022-08-29 — End: 2022-08-29

## 2022-08-29 MED ORDER — SODIUM CHLORIDE 0.9 % (FLUSH) INJECTION SYRINGE
0.9 | INTRAVENOUS | Status: DC | PRN
Start: 2022-08-29 — End: 2022-09-03

## 2022-08-29 MED ORDER — FAMOTIDINE 4 MG/ML IN 0.9% SODIUM CHLORIDE (ADULT)
Freq: Once | INTRAVENOUS | Status: DC | PRN
Start: 2022-08-29 — End: 2022-09-03

## 2022-08-29 MED ORDER — DIPHENHYDRAMINE 50 MG/ML INJECTION (WRAPPED E-RX)
50 | Freq: Once | INTRAVENOUS | Status: DC | PRN
Start: 2022-08-29 — End: 2022-09-03

## 2022-08-29 MED ORDER — ACETAMINOPHEN 325 MG TABLET
325 mg | Freq: Once | ORAL | Status: DC
Start: 2022-08-29 — End: 2022-09-03

## 2022-08-29 MED ORDER — HYDROCORTISONE SOD SUCCINATE (PF) 100 MG/2 ML SOLUTION FOR INJECTION
100 | Freq: Once | INTRAVENOUS | Status: DC | PRN
Start: 2022-08-29 — End: 2022-09-03

## 2022-08-29 MED ORDER — EPINEPHRINE 0.3 MG/0.3 ML INJECTION, AUTO-INJECTOR
0.3 | INTRAMUSCULAR | Status: DC | PRN
Start: 2022-08-29 — End: 2022-09-03

## 2022-08-29 MED ORDER — SODIUM CHLORIDE 0.9 % INTRAVENOUS SOLUTION
INTRAVENOUS | Status: DC | PRN
Start: 2022-08-29 — End: 2022-09-03

## 2022-08-29 MED ORDER — ALBUTEROL SULFATE 2.5 MG/3 ML (0.083 %) SOLUTION FOR NEBULIZATION
2.5 | RESPIRATORY_TRACT | Status: DC | PRN
Start: 2022-08-29 — End: 2022-09-03

## 2022-08-29 MED ORDER — SODIUM CHLORIDE 0.9 % BOLUS (NEW BAG)
0.9 % | Freq: Once | INTRAVENOUS | Status: DC | PRN
Start: 2022-08-29 — End: 2022-09-03

## 2022-08-29 MED ORDER — SODIUM CHLORIDE 0.9 % BOLUS (NEW BAG)
0.9 | Freq: Once | INTRAVENOUS | Status: DC | PRN
Start: 2022-08-29 — End: 2022-09-03

## 2022-08-29 MED ORDER — SODIUM CHLORIDE 0.9 % (FLUSH) INJECTION SYRINGE
0.9 | Freq: Once | INTRAVENOUS | Status: DC
Start: 2022-08-29 — End: 2022-09-03

## 2022-08-29 MED ORDER — HEPARIN, PORCINE (PF) 10 UNIT/ML IN 0.9% SODIUM CHLORIDE IV SYRINGE
10 | Freq: Once | Status: DC | PRN
Start: 2022-08-29 — End: 2022-08-29

## 2022-08-29 MED ORDER — EPINEPHRINE 0.3 MG/0.3 ML INJECTION, AUTO-INJECTOR
0.30.3 mg/ mL | INTRAMUSCULAR | Status: DC | PRN
Start: 2022-08-29 — End: 2022-09-03

## 2022-08-29 MED ORDER — FUROSEMIDE 10 MG/ML INJECTION SOLUTION
10 | Freq: Once | INTRAVENOUS | Status: DC | PRN
Start: 2022-08-29 — End: 2022-09-03

## 2022-08-29 NOTE — Progress Notes
Alecsander arrives today for labs poss with his supportive wifeAssessment completed Denies n/v/c/d/painContinues with fatigued, SOB, and weakHe reports that his mucositis unchnagedHickman dressing changed and, labs drawn and sent WBC: 0.4HGB: 7.7 HCT 23.20PLT: 19ANC: 0.04 PLATS and PRBC's  indicated today. No pre meds. Transfused plat tolerated wellTransfused PRBC's tolerated well.DL Hickman flushed and cappedDischarged transfusion instructions given to pt and wife verbal understandingHe will RTC on 09/02/22 for labs poss On previuos note:Pt and wife requested to review MRI results, APRN notified. She will review results with Drs. Lenard Forth and Sherrell Puller before speaking with pt; pt and wife aware and in agreement. Awaiting phone call

## 2022-08-29 NOTE — Patient Instructions
July 2024  Sunday Monday Tuesday Wednesday Thursday Friday Saturday   1 2SPIROMETRY,LUNG VOL, DLCO ND 11:30 AM (40 min.) PFT PROCEDURE ROOM 2 PULMONARY FUNCTIONLABORATORY - [place] [facility] CHEMO 12:25 PM (50 min.) [facility] ECHO 1 [facility] [place] HavenEchocardiographyY[facility] [place] WITH PREP  2:00 PM (80 min.) [facility] [place] PRE EXAM RM (NP) [facility] [place] Scan 3TREATMENT 4 HOURS  7:45 AM (240 min.) [facility] [place] HEMA INFUSIONMASTER CHAIR Hematology Oncology InfusionProgramRETURN ND  8:30 AM (30 min.) Laurian Brim Rosezella [place]., APRN [facility] 4 5 6TREATMENT 4 HOURS 10:00 AM (240 min.) [facility] [place] HEMA INFUSIONMASTER CHAIR Hematology Oncology InfusionProgram 7 8YHS MRI LUMBAR SPINE W WO IV CONTRAST  1:10 PM (40 min.) [facility] MR MRC 1 [place] [facility] MRI 9 10TREATMENT 4 HOURS 12:30 PM (240 min.) [facility] [place] HEMA INFUSIONMASTER CHAIR Hematology Oncology InfusionProgram 11 12 13TREATMENT 4 HOURS 10:00 AM (240 min.) [facility] [place] HEMA INFUSIONMASTER CHAIR Hematology Oncology InfusionProgram 14 15 16  17TREATMENT 4 HOURS  1:15 PM (240 min.) [facility] [place] HEMA INFUSIONMASTER CHAIR Hematology Oncology InfusionProgram 18 19 20TREATMENT 4 HOURS 10:00 AM (240 min.) [facility] [place] HEMA INFUSIONMASTER CHAIR Hematology Oncology InfusionProgram 21TREATMENT 4 HOURS 10:00 AM (240 min.) [facility] [place] HEMA INFUSIONMASTER CHAIR Hematology Oncology InfusionProgram 22[facility] [place] GUIDED NEEDLE BX ASP BONE MARROW  8:40 AM (180 min.) Y[facility] CT2 NP 2508 [facility] [place] Scan 23 24COLONOSCOPY, FLEXIBLE; DX, W/WO SPECIMENS/COLON DECOMP (SEP PROC)  8:32 AM Tawana Scale, MD [facility] ENDOSCOPYRETURN ND  1:00 PM (45 min.) Chrystine Oiler, MD [facility] HOURS  1:15 PM (240 min.) [facility] HEMA INFUSIONMASTER CHAIR Hematology Oncology InfusionProgram 25 26RETURN ND  3:00 PM (30 min.) Vernelle Emerald, MD [facility] 27TREATMENT 4 HOURS 10:00 AM (240 min.) [facility] HEMA INFUSIONMASTER CHAIR Hematology Oncology InfusionProgram 28 29 30  31TREATMENT 4 HOURS 10:15 AM (240 min.) [facility] [place] HEMA MASTER ROOM Hematology Oncology InfusionProgram      August 2024  Sunday Monday Tuesday Wednesday Thursday Friday Saturday      1 2 3TREATMENT 4 HOURS 10:00 AM (240 min.) [facility] [place] HEMA INFUSIONMASTER CHAIR Hematology Oncology InfusionProgram 4 5 6  7TREATMENT 4 HOURS 10:15 AM (240 min.) [facility] [place] HEMA MASTER ROOM Hematology Oncology InfusionProgram 8 9 10TREATMENT 4 HOURS 10:00 AM (240 min.) [facility] [place] HEMA INFUSIONMASTER CHAIR Hematology Oncology InfusionProgram 11 12 13  14TREATMENT 4 HOURS 10:15 AM (240 min.) [facility] [place] HEMA MASTER ROOM Hematology Oncology InfusionProgram 15 16 17TREATMENT 4 HOURS 10:00 AM (240 min.) [facility] [place] HEMA INFUSIONMASTER CHAIR Hematology Oncology InfusionProgram 18 19 20  21TREATMENT 4 HOURS 10:15 AM (240 min.) [facility] [place] HEMA MASTER ROOM Hematology Oncology InfusionProgram 22 23 24TREATMENT 4 HOURS 10:00 AM (240 min.) [facility] [place] HEMA INFUSIONMASTER CHAIR Hematology Oncology InfusionProgram 25 26 27  28TREATMENT 4 HOURS 10:15 AM (240 min.) [facility] [place] HEMA MASTER ROOM Hematology Oncology InfusionProgram 29 30 31TREATMENT 4 HOURS 10:00 AM (240 min.) [facility] [place] HEMA INFUSIONMASTER CHAIR Hematology Oncology InfusionProgram   Patient Education Blood transfusion The Basics Written by the doctors and editors at UpToDate What is a blood transfusion? -- Blood transfusion is when a person gets donated blood. You might need donated blood if MWU:XLKG a lot of blood, for example in an accident or during surgeryHave a medical condition that affects your bloodBlood is made up of different parts:Red blood cells carry oxygen to your bodyWhite blood cells help fight infectionsPlatelets help your blood to clotPlasma is the liquid part of your blood. It contains many  types of proteins. Some of these proteins help your blood to clot.Depending on why you need a transfusion, you might need just 1 of these parts. Or you might need whole blood, which has all the parts.Where does the blood come from? -- Most of the time, blood for a transfusion comes from a blood collection center (sometimes called a blood bank). This center might be part of a hospital or it might be separate. When people donate blood, it goes to 1 of these centers. Then it is tested, made ready to use, and stored until it is needed.In some cases, a person can donate their own blood, which can be stored for their own use if they are planning to have surgery soon. That way, if they need a blood transfusion, they can get their own blood.How do I know the blood is safe? -- Before your blood transfusion, you will have a blood test to check your blood type (A, B, AB, or O). This also involves checking for a specific protein that some people have on their red blood cells, called Rh-factor. If your blood has this protein, it is called Rh-positive, and if it does not, it is called Rh-negative. Donated blood is tested, too. This is to make sure you get blood that is compatible with your blood type. If the blood is not compatible, your body will attack the new blood and make you sick.People who donate blood must answer questions to learn if they are at a higher risk for some infections. The blood bank also tests the donated blood for the bacteria that causes syphilis and for certain viruses. Some of these include HIV, hepatitis B virus, hepatitis C virus, and West Nile virus. They do not test the blood for the virus that causes COVID-19.What happens during a blood transfusion? -- For a transfusion, you get the donated blood through an IV (a thin tube that goes into a vein) (figure 1). A blood transfusion can take up to 4 hours, depending on how much blood you need and how quickly you need it. The staff will closely watch your heart rate, blood pressure, and temperature while you get the blood. After the blood transfusion, the staff will take the IV out, unless you need it to get other fluids or medicines.What are the risks of blood transfusion? -- Risks of a blood transfusion include:Allergic reactions, fever, or hivesShortness of breath if your body has trouble handling extra fluidsA reaction in the lungs that affects breathingThe body attacking donated red blood cellsAn infection for which the blood is not screened, or a bacterial infectionWhat else do I need to know? -- Be sure to tell your doctor right away if you have any symptoms during your transfusion that might mean there is a problem. Symptoms can also happen days or weeks after your transfusion. Tell your doctor or nurse right away if you have any of the following:Signs of a very bad reaction - These include wheezing; trouble breathing, chest or back pain or tightness; fever; itching; bad cough; seizures; or swelling of face, lips, tongue, or throat. If you have any of these symptoms after your transfusion, call for an ambulanceright away (in the [place] and [place], call 9-1-1).Signs of infection - These include a fever of 100.4?F (38?C) or higher or chills.Headache or dizzinessItching or a rashUrine that looks red or dark in colorAll topics are updated as new evidence becomes available and our peer review process is complete.This topic retrieved from UpToDate on: Aug 04, 2021.Topic [phone]  Version 1.0Release: 31.2.26 - C31.169? 2023 UpToDate, Inc. and/or its affiliates. All rights reserved.figure 1: Blood transfusion A blood transfusion is given through an IV and can take up to 4 hours.Graphic [id number] Version 1.0Consumer Information Use and Disclaimer This generalized information is a limited summary of diagnosis, treatment, and/or medication information. It is not meant to be comprehensive and should be used as a tool to help the user understand and/or assess potential diagnostic and treatment options. It does NOT include all information about conditions, treatments, medications, side effects, or risks that may apply to a specific patient. It is not intended to be medical advice or a substitute for the medical advice, diagnosis, or treatment of a health care provider based on the health care provider's examination and assessment of a patient's specific and unique circumstances. Patients must speak with a health care provider for complete information about their health, medical questions, and treatment options, including any risks or benefits regarding use of medications. This information does not endorse any treatments or medications as safe, effective, or approved for treating a specific patient. UpToDate, Inc. and its affiliates disclaim any warranty or liability relating to this information or the use thereof.The use of this information is governed by the Terms of Use, available at https://www.wolterskluwer.com/en/know/clinical-effectiveness-terms ?2023 UpToDate, Inc. and its affiliates and/or licensors. All rights reserved.Copyright ? 2023 UpToDate, Inc. and/or its affiliates. All rights reserved.Patient Education Blood Transfusion Why is this procedure done? Blood carries oxygen and nutrients throughout your body. Sometimes, you lose blood due to an injury, accident, or surgery. When this happens, it means parts of your body may not get as much oxygen as it needs. Other times, you might have an illness that causes problems with your blood cells or your body is not able to make blood properly. If you do not have enough blood cells, you will need to have them replaced by having a blood transfusion.Blood is made up of different parts: red blood cells, white blood cells, platelets, and plasma. Sometimes a blood transfusion is given whole, with all the parts, but more often as individual parts. What will the results be? After a transfusion, your body will work better. Good, healthy blood cells will replace lost or damaged ones. What happens before the procedure? A small amount of blood will be taken from you. This will be tested for your blood type and your Rh antibody factor. Next, the blood from the donor is tested to see if it matches your blood type. Blood used for a transfusion must be your type or your blood will attack the new blood and make you sick. Blood used for blood transfusions is collected, tested, and stored in a blood bank. The blood bank screens all blood donors and the donated blood for illnesses that can be passed on through the blood. It may be possible to use your own blood if you know you will need blood in advance or it may be possible to collect and reuse your own blood during surgery.What happens during the procedure? You will need a small tube called an IV line. It is placed in a vein in your arm or hand. The blood is given to you through this line. This may take 1 to 4 hours, depending on how much blood you need and how quickly you need it. You will be awake during the procedure. The staff will closely watch your heart rate, blood pressure, and temperature while you get the blood. What happens after the procedure? The IV line  will be taken out. You may feel soreness or have some bruising at the IV site for a few days. Your doctor may order more lab tests before you can go home.What care is needed at home? You may be able to go back to your normal activities after the procedure. Ask your doctor what level of activity is right for you. What follow-up care is needed? Your doctor may ask you to make visits to the office to check on your progress. Be sure to keep these visits. What problems could happen? Risks of a blood transfusion are rare. They can include:Allergic reactions, fever, or hivesInfections, including CMV, HIV, or hepatitisA reaction to the new blood that can cause your regular blood cells or body tissue to break downKidney or lung problemsLow blood sugar or electrolyte problemsDonor blood attacks the bodyToo much fluid or iron in your bodyWhen do I need to call the doctor? Be sure to tell your doctor right away if you have any of these signs during your transfusion. They may also happen days or weeks after your transfusion. Call your doctor right away if you have:Signs of a very bad reaction. These include wheezing; chest tightness; fever; itching; bad cough; blue skin color; seizures; or swelling of face, lips, tongue, or throat. Go to the ER right away.Signs of infection. These include a fever of 100.4?F (38?C) or higher, chills.Trouble breathing or start having chest or back painHeadache or dizzinessItching or a rashWorry or feeling restlessFeel sick to your stomach or loose stoolsUrine becomes red or dark in color Where can I learn more? Kids CultureParks.com.ee?ref=search NHS Choiceshttps://www.nhs.uk/conditions/blood-transfusion/ Last Reviewed Date 2021-03-30Consumer Information Use and Disclaimer This generalized information is a limited summary of diagnosis, treatment, and/or medication information. It is not meant to be comprehensive and should be used as a tool to help the user understand and/or assess potential diagnostic and treatment options. It does NOT include all information about conditions, treatments, medications, side effects, or risks that may apply to a specific patient. It is not intended to be medical advice or a substitute for the medical advice, diagnosis, or treatment of a health care provider based on the health care provider's examination and assessment of a patient?s specific and unique circumstances. Patients must speak with a health care provider for complete information about their health, medical questions, and treatment options, including any risks or benefits regarding use of medications. This information does not endorse any treatments or medications as safe, effective, or approved for treating a specific patient. UpToDate, Inc. and its affiliates disclaim any warranty or liability relating to this information or the use thereof. The use of this information is governed by the Terms of Use, available at https://www.wolterskluwer.com/en/know/clinical-effectiveness-terms Copyright Copyright ? 2022 UpToDate, Inc. and its affiliates and/or licensors. All rights reserved.

## 2022-08-31 ENCOUNTER — Telehealth: Admit: 2022-08-31 | Payer: PRIVATE HEALTH INSURANCE | Attending: Medical Oncology

## 2022-08-31 NOTE — Telephone Encounter
I received a phone call from Douglas [name], Douglas [name]'s wife. She said she saw Douglas [name] was booked for infusion appointments through August and wanted to make sure nothing had changed with his Allogeneic stem cell transplant admission, which is planned for August 2nd. I let her know that nothing has changed regarding his admission and the plan is still admission on August 2nd. She also asked about the results of the MRI that he had last week and had said that Misty Stanley was going to give her a call to review the results with her after she spoke with [name] and Dr. Sherrell Puller. I let her know that I would reach out to [place] to inquire. Per Misty Stanley, she is waiting on more information and will give them a call as soon as she has more information.

## 2022-09-02 ENCOUNTER — Encounter: Admit: 2022-09-02 | Payer: PRIVATE HEALTH INSURANCE | Attending: Family

## 2022-09-02 ENCOUNTER — Inpatient Hospital Stay: Admit: 2022-09-02 | Discharge: 2022-09-02 | Payer: MEDICARE

## 2022-09-02 DIAGNOSIS — C9110 Chronic lymphocytic leukemia of B-cell type not having achieved remission: Secondary | ICD-10-CM

## 2022-09-02 DIAGNOSIS — C9200 Acute myeloblastic leukemia, not having achieved remission: Secondary | ICD-10-CM

## 2022-09-02 DIAGNOSIS — C931 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: Secondary | ICD-10-CM

## 2022-09-02 LAB — CBC WITH AUTO DIFFERENTIAL
BKR WAM ABSOLUTE LYMPHOCYTE COUNT.: >60 mL/min/{1.73_m2} (ref >=60)
BKR WAM ABSOLUTE NRBC (2 DEC): 0 x 1000/ÂµL (ref 0.00–1.00)
BKR WAM ANC (ABSOLUTE NEUTROPHIL COUNT): 0.05 x 1000/ÂµL — ABNORMAL LOW (ref 2.00–7.60)
BKR WAM BASOPHILS: 3.8 g/dL (ref 2.0–3.9)
BKR WAM HEMATOCRIT (2 DEC): 24 % — ABNORMAL LOW (ref 38.50–50.00)
BKR WAM HEMOGLOBIN: 8.1 g/dL — ABNORMAL LOW (ref 13.2–17.1)
BKR WAM MCH (PG): 27.9 pg (ref 27.0–33.0)
BKR WAM MCHC: 33.8 g/dL (ref 31.0–36.0)
BKR WAM MCV: 82.8 fL (ref 80.0–100.0)
BKR WAM MONOCYTES: 12 U/L (ref 9–59)
BKR WAM MPV: 4 g/dL (ref 3.6–5.1)
BKR WAM NEUTROPHILS: 0.6 mg/dL (ref <=1.2)
BKR WAM NUCLEATED RED BLOOD CELLS: 0 % (ref 0.0–1.0)
BKR WAM PLATELETS: 4 x1000/ÂµL — CL (ref 150–420)
BKR WAM RDW-CV: 15 % (ref 11.0–15.0)
BKR WAM RED BLOOD CELL COUNT.: 2.9 M/ÂµL — ABNORMAL LOW (ref 4.00–6.00)
BKR WAM WHITE BLOOD CELL COUNT: 0.4 x1000/ÂµL — ABNORMAL LOW (ref 4.0–11.0)

## 2022-09-02 LAB — IMMATURE PLATELET FRACTION (BH GH LMW YH)
BKR WAM IPF, ABSOLUTE: 0.1 x1000/ÂµL — ABNORMAL LOW (ref 38.50–<20.0)
BKR WAM IPF, ABSOLUTE: 0.5 x1000/ÂµL (ref <20.0)
BKR WAM IPF: 2.2 % — ABNORMAL LOW (ref 1.2–8.6)
BKR WAM IPF: 4.1 % (ref 1.2–8.6)

## 2022-09-02 LAB — COMPREHENSIVE METABOLIC PANEL
BKR A/G RATIO: 1.1 (ref 1.0–2.2)
BKR ALANINE AMINOTRANSFERASE (ALT): 12 U/L (ref 9–59)
BKR ALBUMIN: 4 g/dL (ref 3.6–5.1)
BKR ALKALINE PHOSPHATASE: 117 U/L (ref 9–122)
BKR ANION GAP: 13 (ref 7–17)
BKR ASPARTATE AMINOTRANSFERASE (AST): 16 U/L (ref 10–35)
BKR AST/ALT RATIO: 1.3
BKR BILIRUBIN TOTAL: 0.6 mg/dL (ref <=1.2)
BKR BLOOD UREA NITROGEN: 16 mg/dL (ref 8–23)
BKR BUN / CREAT RATIO: 15.4 (ref 8.0–23.0)
BKR CALCIUM: 9.1 mg/dL (ref 8.8–10.2)
BKR CHLORIDE: 103 mmol/L — ABNORMAL LOW (ref 98–107)
BKR CO2: 20 mmol/L (ref 20–30)
BKR CREATININE: 1.04 mg/dL (ref 0.40–1.30)
BKR EGFR, CREATININE (CKD-EPI 2021): >60 mL/min/{1.73_m2} (ref >=60)
BKR GLOBULIN: 3.8 g/dL (ref 2.0–3.9)
BKR GLUCOSE: 108 mg/dL — ABNORMAL HIGH (ref 70–100)
BKR POTASSIUM: 4.1 mmol/L (ref 3.3–5.3)
BKR PROTEIN TOTAL: 7.8 g/dL (ref 5.9–8.3)
BKR SODIUM: 136 mmol/L (ref 136–144)

## 2022-09-02 LAB — PLATELET COUNT: BKR WAM PLATELETS: 11 x1000/ÂµL — ABNORMAL LOW (ref 150–420)

## 2022-09-02 LAB — URIC ACID: BKR URIC ACID: 3 mg/dL (ref 2.7–7.3)

## 2022-09-02 LAB — LACTATE DEHYDROGENASE: BKR LACTATE DEHYDROGENASE: 262 U/L — ABNORMAL HIGH (ref 122–241)

## 2022-09-02 LAB — PHOSPHORUS     (BH GH L LMW YH): BKR PHOSPHORUS: 2.7 mg/dL (ref 2.2–4.5)

## 2022-09-02 NOTE — Progress Notes
Douglas [name] is here for labs/possVitals stable, denies pain. Denies N/V/D, fever/chills, bleedingOngoing DOE but resolves with restFeeling great today overallRDLH C/D/I, labs drawnWBC 0.4, ANC 0.05, Hgb 8.1, platelets 4Transfused with two bags of platelets, post count drawn between bagsTransfusions tolerated without incident, no pre meds requiredD/c in stable conditionRTC Friday for labs/poss (on wait list)Labs scheduled for Sunday, has bmbx on Monday

## 2022-09-03 ENCOUNTER — Telehealth: Admit: 2022-09-03 | Payer: PRIVATE HEALTH INSURANCE | Attending: Hematology

## 2022-09-03 NOTE — Telephone Encounter
Hi,Douglas [name], from [place] [facility], called the office. He requested that the provider place lab orders for the bone marrow biopsy on Monday. Please advise.

## 2022-09-03 NOTE — Telephone Encounter
Spoke with patient regarding his [place] guided Biopsy on September 07, 2022. Instructed patient to arrive 0840, check in 2nd flr DR desk [place]. [employer] rights garage. Wife will be accompanying patient. NPO starting midnight. Meds with sip of water. Do not take Lisinopril morning of procedure. Patient verbalized understanding of instructions. All questions answered.

## 2022-09-04 ENCOUNTER — Encounter: Admit: 2022-09-04 | Payer: PRIVATE HEALTH INSURANCE | Attending: Hematology

## 2022-09-04 ENCOUNTER — Inpatient Hospital Stay: Admit: 2022-09-04 | Discharge: 2022-09-04 | Payer: MEDICARE

## 2022-09-04 ENCOUNTER — Encounter: Admit: 2022-09-04 | Payer: PRIVATE HEALTH INSURANCE | Attending: Medical Oncology

## 2022-09-04 ENCOUNTER — Encounter: Admit: 2022-09-04 | Payer: PRIVATE HEALTH INSURANCE | Attending: Vascular and Interventional Radiology

## 2022-09-04 DIAGNOSIS — I119 Hypertensive heart disease without heart failure: Secondary | ICD-10-CM

## 2022-09-04 DIAGNOSIS — E782 Mixed hyperlipidemia: Secondary | ICD-10-CM

## 2022-09-04 DIAGNOSIS — C931 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: Secondary | ICD-10-CM

## 2022-09-04 DIAGNOSIS — R21 Rash and other nonspecific skin eruption: Secondary | ICD-10-CM

## 2022-09-04 DIAGNOSIS — C9200 Acute myeloblastic leukemia, not having achieved remission: Secondary | ICD-10-CM

## 2022-09-04 DIAGNOSIS — I10 Essential (primary) hypertension: Secondary | ICD-10-CM

## 2022-09-04 DIAGNOSIS — Z8249 Family history of ischemic heart disease and other diseases of the circulatory system: Secondary | ICD-10-CM

## 2022-09-04 DIAGNOSIS — Z0000 Encounter for general adult medical examination without abnormal findings: Secondary | ICD-10-CM | POA: Diagnosis not present

## 2022-09-04 DIAGNOSIS — Z79899 Other long term (current) drug therapy: Secondary | ICD-10-CM | POA: Diagnosis not present

## 2022-09-04 DIAGNOSIS — Z1211 Encounter for screening for malignant neoplasm of colon: Secondary | ICD-10-CM | POA: Diagnosis not present

## 2022-09-04 DIAGNOSIS — E039 Hypothyroidism, unspecified: Secondary | ICD-10-CM | POA: Diagnosis not present

## 2022-09-04 DIAGNOSIS — K219 Gastro-esophageal reflux disease without esophagitis: Secondary | ICD-10-CM | POA: Diagnosis not present

## 2022-09-04 DIAGNOSIS — Z125 Encounter for screening for malignant neoplasm of prostate: Secondary | ICD-10-CM | POA: Diagnosis not present

## 2022-09-04 DIAGNOSIS — H409 Unspecified glaucoma: Secondary | ICD-10-CM

## 2022-09-04 DIAGNOSIS — F5101 Primary insomnia: Secondary | ICD-10-CM

## 2022-09-04 DIAGNOSIS — N400 Enlarged prostate without lower urinary tract symptoms: Secondary | ICD-10-CM

## 2022-09-04 DIAGNOSIS — C9110 Chronic lymphocytic leukemia of B-cell type not having achieved remission: Secondary | ICD-10-CM

## 2022-09-04 LAB — IMMATURE PLATELET FRACTION (BH GH LMW YH)
BKR WAM IPF, ABSOLUTE: 0.3 x1000/ÂµL (ref 20–<20.0)
BKR WAM IPF: 1.4 % (ref 1.2–8.6)

## 2022-09-04 LAB — CBC WITH AUTO DIFFERENTIAL
BKR WAM ABSOLUTE NRBC (2 DEC): 0 x 1000/ÂµL (ref 0.00–1.00)
BKR WAM ANC (ABSOLUTE NEUTROPHIL COUNT): 0.05 x 1000/ÂµL — ABNORMAL LOW (ref 2.00–7.60)
BKR WAM HEMATOCRIT (2 DEC): 22 % — ABNORMAL LOW (ref 38.50–50.00)
BKR WAM HEMOGLOBIN: 7.5 g/dL — ABNORMAL LOW (ref 13.2–17.1)
BKR WAM MCH (PG): 28.2 pg (ref 27.0–33.0)
BKR WAM MCHC: 34.1 g/dL (ref 31.0–36.0)
BKR WAM MCV: 82.7 fL (ref 80.0–100.0)
BKR WAM NUCLEATED RED BLOOD CELLS: 0 % (ref 0.0–1.0)
BKR WAM PLATELETS: 24 x1000/ÂµL — ABNORMAL LOW (ref 150–420)
BKR WAM RDW-CV: 15.3 % — ABNORMAL HIGH (ref 11.0–15.0)
BKR WAM RED BLOOD CELL COUNT.: 2.66 M/ÂµL — ABNORMAL LOW (ref 4.00–6.00)
BKR WAM WHITE BLOOD CELL COUNT: 0.4 x1000/ÂµL — ABNORMAL LOW (ref 4.0–11.0)

## 2022-09-04 LAB — COMPREHENSIVE METABOLIC PANEL
BKR A/G RATIO: 1 (ref 1.0–2.2)
BKR ALANINE AMINOTRANSFERASE (ALT): 20 U/L (ref 9–59)
BKR ALBUMIN: 4 g/dL (ref 3.6–5.1)
BKR ALKALINE PHOSPHATASE: 111 U/L (ref 9–122)
BKR ANION GAP: 11 (ref 7–17)
BKR ASPARTATE AMINOTRANSFERASE (AST): 19 U/L (ref 10–35)
BKR AST/ALT RATIO: 1
BKR BILIRUBIN TOTAL: 0.7 mg/dL (ref <=1.2)
BKR BLOOD UREA NITROGEN: 13 mg/dL (ref 8–23)
BKR BUN / CREAT RATIO: 12.9 (ref 8.0–23.0)
BKR CALCIUM: 9.2 mg/dL (ref 8.8–10.2)
BKR CHLORIDE: 105 mmol/L (ref 98–107)
BKR CO2: 21 mmol/L (ref 20–30)
BKR CREATININE: 1.01 mg/dL (ref 0.40–1.30)
BKR EGFR, CREATININE (CKD-EPI 2021): >60 mL/min/{1.73_m2} (ref >=60)
BKR GLOBULIN: 3.9 g/dL (ref 2.0–3.9)
BKR GLUCOSE: 94 mg/dL (ref 70–100)
BKR POTASSIUM: 4.4 mmol/L (ref 3.3–5.3)
BKR PROTEIN TOTAL: 7.9 g/dL (ref 5.9–8.3)
BKR SODIUM: 137 mmol/L (ref 136–144)

## 2022-09-04 LAB — PHOSPHORUS     (BH GH L LMW YH): BKR PHOSPHORUS: 2.4 mg/dL (ref 2.2–4.5)

## 2022-09-04 LAB — LACTATE DEHYDROGENASE: BKR LACTATE DEHYDROGENASE: 265 U/L — ABNORMAL HIGH (ref 122–241)

## 2022-09-04 LAB — URIC ACID: BKR URIC ACID: 2.8 mg/dL (ref 2.7–7.3)

## 2022-09-04 MED ORDER — SODIUM CHLORIDE 0.9 % BOLUS (NEW BAG)
0.9 % | Freq: Once | INTRAVENOUS | Status: AC | PRN
Start: 2022-09-04 — End: 2022-09-09

## 2022-09-04 MED ORDER — ALBUTEROL SULFATE 2.5 MG/3 ML (0.083 %) SOLUTION FOR NEBULIZATION
2.530.083 mg /3 mL (0.083 %) | RESPIRATORY_TRACT | Status: AC | PRN
Start: 2022-09-04 — End: 2022-09-09

## 2022-09-04 MED ORDER — HYDROCORTISONE SOD SUCCINATE (PF) 100 MG/2 ML SOLUTION FOR INJECTION
1002 mg/2 mL | Freq: Once | INTRAVENOUS | Status: AC | PRN
Start: 2022-09-04 — End: 2022-09-09

## 2022-09-04 MED ORDER — EPINEPHRINE 0.3 MG/0.3 ML INJECTION, AUTO-INJECTOR
0.30.3 mg/ mL | INTRAMUSCULAR | Status: AC | PRN
Start: 2022-09-04 — End: 2022-09-09

## 2022-09-04 MED ORDER — FAMOTIDINE 4 MG/ML IN 0.9% SODIUM CHLORIDE (ADULT)
Freq: Once | INTRAVENOUS | Status: AC | PRN
Start: 2022-09-04 — End: 2022-09-09

## 2022-09-04 MED ORDER — DIPHENHYDRAMINE 50 MG/ML INJECTION (WRAPPED E-RX)
50 mg/mL | Freq: Once | INTRAVENOUS | Status: AC | PRN
Start: 2022-09-04 — End: 2022-09-09

## 2022-09-04 NOTE — Progress Notes
Labs/supportive care needs due today.VSS. See focused reassessment.Purvis reports being very tired, no energy. His toes are dry and red due to sunburn. Skin is intact. He was cautioned about sun exposure and skin damage. Complete Blood CountResults in Past 7 DaysResult Component Current Result Hematocrit 22.00 (L) (09/04/2022) Hemoglobin 7.5 (L) (09/04/2022) MCH 28.2 (09/04/2022) MCHC 34.1 (09/04/2022) MCV 82.7 (09/04/2022) Platelets 24 (L) (09/04/2022) RBC 2.66 (L) (09/04/2022) WBC 0.4 (L) (09/04/2022)  No premeds required for transfusions. He tolerated 1 bag of platelets and 1 unit RBC w/o adverse event. He returns to NP 7 infusion on 7/21 for labs, supportive care needs ( is having [place] guided bmbx in IR on 7/22) and hickman dressing change.

## 2022-09-05 ENCOUNTER — Ambulatory Visit: Admit: 2022-09-05 | Payer: MEDICARE

## 2022-09-06 ENCOUNTER — Inpatient Hospital Stay: Admit: 2022-09-06 | Discharge: 2022-09-06 | Payer: MEDICARE

## 2022-09-06 DIAGNOSIS — C931 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: Secondary | ICD-10-CM

## 2022-09-06 DIAGNOSIS — C9200 Acute myeloblastic leukemia, not having achieved remission: Secondary | ICD-10-CM

## 2022-09-06 LAB — LACTATE DEHYDROGENASE: BKR LACTATE DEHYDROGENASE: 269 U/L — ABNORMAL HIGH (ref 122–241)

## 2022-09-06 LAB — COMPREHENSIVE METABOLIC PANEL
BKR A/G RATIO: 1 (ref 1.0–2.2)
BKR ALANINE AMINOTRANSFERASE (ALT): 18 U/L (ref 9–59)
BKR ALBUMIN: 3.9 g/dL (ref 3.6–5.1)
BKR ALKALINE PHOSPHATASE: 108 U/L (ref 9–122)
BKR ANION GAP: 13 g/dL (ref 7–17)
BKR ASPARTATE AMINOTRANSFERASE (AST): 15 U/L (ref 10–35)
BKR AST/ALT RATIO: 0.8
BKR BILIRUBIN TOTAL: 0.7 mg/dL (ref <=1.2)
BKR BLOOD UREA NITROGEN: 13 mg/dL — ABNORMAL LOW (ref 8–23)
BKR BUN / CREAT RATIO: 13.5 (ref 8.0–23.0)
BKR CALCIUM: 9.2 mg/dL (ref 8.8–10.2)
BKR CHLORIDE: 103 mmol/L (ref 98–107)
BKR CO2: 19 mmol/L — ABNORMAL LOW (ref 20–30)
BKR CREATININE: 0.96 mg/dL (ref 0.40–1.30)
BKR EGFR, CREATININE (CKD-EPI 2021): >60 mL/min/{1.73_m2} (ref >=60)
BKR GLOBULIN: 3.8 g/dL (ref 2.0–3.9)
BKR GLUCOSE: 133 mg/dL — ABNORMAL HIGH (ref 70–100)
BKR POTASSIUM: 4.2 mmol/L — ABNORMAL LOW (ref 3.3–5.3)
BKR PROTEIN TOTAL: 7.7 g/dL (ref 5.9–8.3)
BKR SODIUM: 135 mmol/L — ABNORMAL LOW (ref 136–144)

## 2022-09-06 LAB — CBC WITH AUTO DIFFERENTIAL
BKR WAM ABSOLUTE NRBC (2 DEC): 0 x 1000/ÂµL (ref 0.00–1.00)
BKR WAM ANC (ABSOLUTE NEUTROPHIL COUNT): 0.05 x 1000/ÂµL — ABNORMAL LOW (ref 2.00–7.60)
BKR WAM HEMATOCRIT (2 DEC): 24.3 % — ABNORMAL LOW (ref 38.50–50.00)
BKR WAM HEMOGLOBIN: 8.4 g/dL — ABNORMAL LOW (ref 13.2–17.1)
BKR WAM MCH (PG): 28.6 pg (ref 27.0–33.0)
BKR WAM MCHC: 34.6 g/dL (ref 31.0–36.0)
BKR WAM MCV: 82.7 fL (ref 80.0–100.0)
BKR WAM NUCLEATED RED BLOOD CELLS: 0 % (ref 0.0–1.0)
BKR WAM PLATELETS: 17 x1000/ÂµL — ABNORMAL LOW (ref 150–420)
BKR WAM RDW-CV: 14.6 % (ref 11.0–15.0)
BKR WAM RED BLOOD CELL COUNT.: 2.94 M/ÂµL — ABNORMAL LOW (ref 4.00–6.00)
BKR WAM WHITE BLOOD CELL COUNT: 0.4 x1000/ÂµL — ABNORMAL LOW (ref 4.0–11.0)

## 2022-09-06 LAB — IMMATURE PLATELET FRACTION (BH GH LMW YH)
BKR WAM IPF, ABSOLUTE: 0.1 x1000/ÂµL — ABNORMAL LOW (ref 20–<20.0)
BKR WAM IPF: 0.6 % — ABNORMAL LOW (ref 1.2–8.6)

## 2022-09-06 LAB — URIC ACID: BKR URIC ACID: 2.7 mg/dL (ref 2.7–7.3)

## 2022-09-06 LAB — PHOSPHORUS     (BH GH L LMW YH): BKR PHOSPHORUS: 2.2 mg/dL (ref 2.2–4.5)

## 2022-09-06 NOTE — Progress Notes
Pt arrived to clinic for labs/possible transfusion as scheduled.  Labs drawn via hickman as ordered.  Pt denies new complaint.  Hgb 8.4, platelets 17 today.  Transfused 1 dose platelets without difficulty.  Hickman dressing changed, both lumens flushed + blood return noted, capped.  Discharged to home in stable condition, accompanied by wife.  To rtc tomorrow am for [place] guided biopsy as scheduled.

## 2022-09-07 ENCOUNTER — Encounter: Admit: 2022-09-07 | Payer: PRIVATE HEALTH INSURANCE | Attending: Family

## 2022-09-07 ENCOUNTER — Inpatient Hospital Stay: Admit: 2022-09-07 | Discharge: 2022-09-07 | Payer: MEDICARE

## 2022-09-07 ENCOUNTER — Encounter: Admit: 2022-09-07 | Payer: PRIVATE HEALTH INSURANCE | Attending: Hematology

## 2022-09-07 DIAGNOSIS — I119 Hypertensive heart disease without heart failure: Secondary | ICD-10-CM

## 2022-09-07 DIAGNOSIS — Z7689 Persons encountering health services in other specified circumstances: Secondary | ICD-10-CM

## 2022-09-07 DIAGNOSIS — C9110 Chronic lymphocytic leukemia of B-cell type not having achieved remission: Secondary | ICD-10-CM

## 2022-09-07 DIAGNOSIS — F5101 Primary insomnia: Secondary | ICD-10-CM

## 2022-09-07 DIAGNOSIS — C931 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: Secondary | ICD-10-CM

## 2022-09-07 DIAGNOSIS — R21 Rash and other nonspecific skin eruption: Secondary | ICD-10-CM

## 2022-09-07 DIAGNOSIS — C9200 Acute myeloblastic leukemia, not having achieved remission: Secondary | ICD-10-CM

## 2022-09-07 DIAGNOSIS — H409 Unspecified glaucoma: Secondary | ICD-10-CM

## 2022-09-07 DIAGNOSIS — N400 Enlarged prostate without lower urinary tract symptoms: Secondary | ICD-10-CM

## 2022-09-07 DIAGNOSIS — E782 Mixed hyperlipidemia: Secondary | ICD-10-CM

## 2022-09-07 LAB — BONE MARROW SMEAR, ASPIRATION, AND STAIN      (L YH)

## 2022-09-07 MED ORDER — MIDAZOLAM 1 MG/ML INJECTION SOLUTION
1 | Status: CP
Start: 2022-09-07 — End: ?

## 2022-09-07 MED ORDER — LIDOCAINE HCL 10 MG/ML (1 %) INJECTION SOLUTION
10 mg/mL (1 %) | INTRAMUSCULAR | Status: CP
  Administered 2022-09-07: 15:00:00 10 mL via INTRAMUSCULAR

## 2022-09-07 MED ORDER — FENTANYL (PF) 50 MCG/ML INJECTION SOLUTION
50 | Status: CP
Start: 2022-09-07 — End: ?
  Administered 2022-09-07 (×2): 50 mcg/mL via INTRAVENOUS

## 2022-09-07 MED ORDER — FENTANYL (PF) 50 MCG/ML INJECTION SOLUTION
50 | Status: CP
Start: 2022-09-07 — End: ?

## 2022-09-07 MED ORDER — MIDAZOLAM (PF) 1 MG/ML INJECTION SOLUTION
1 | Status: CP
Start: 2022-09-07 — End: ?
  Administered 2022-09-07 (×2): 1 mg/mL via INTRAVENOUS

## 2022-09-07 NOTE — Anesthesia Procedure Notes
10:19 AM Consent obtained by Dr. Dene Gentry

## 2022-09-07 NOTE — Other
Patient received via stretcher post [place] biopsy under moderate sedation; VSS - see flowsheets for details; dsg on lower back area is CDI; no complaints of pain/discomfort; no n/v; patient able to tolerate meal and fluids; patient's wife at bedside; dc instructions given and understood by patient and wife; transported by his wife via wheelchair in stable condition

## 2022-09-07 NOTE — Anesthesia Procedure Notes
Patient placed in a prone position on the table.

## 2022-09-07 NOTE — Anesthesia Procedure Notes
Specimen obtained.

## 2022-09-07 NOTE — Telephone Encounter
First attempt made to conduct pre procedure call for an upper endoscopy & colonoscopy on 09/09/2022. Patient contact made-patient reports that he is not sure if procedure will proceed as scheduled due to recent bloodwork results. Per patient, he was advised to obtain more labs, receive platelet transfusion & meet with his team of physicians on Wednesday 09/09/2022 to discuss future plans. Chart submitted for NP review.

## 2022-09-07 NOTE — Discharge Instructions
[facility]	 [place] [facility] [place] BIOPSY DISCHARGE INSTRUCTIONSYOUR PROCEDURE WAS PERFORMED BY DR. ANNIE WANGSite Care:1.  Remove dressing after 24 hours (1 day).2.  Keep wound clean and dry.3.  May shower with the dressing on.4.  No swimming, hot tubs or tub bath for 5 days.Activity:For the next 24 hours do not drive, consume alcohol, make any legal decisions, operate heavy machinery or perform strenuous activity.Diet:Resume diet prior to procedure as tolerated. Good nutrition promotes healing.Medications:Resume all previous medications without change.Call your doctor if you have:Temperature greater than 100.4Persistent nausea and vomitingSevere uncontrolled painRedness, tenderness, or signs of infection (pain, swelling, redness, odor or green/yellow discharge around the site)Difficulty breathing, headache or visual disturbancesHivesPersistent dizziness or light-headednessExtreme fatigueAny other questions or concerns you may have after dischargeIn an emergency, call 911 or go to an Emergency Department at a nearby hospital.It is important to bring a complete, current list of your medications to any medical appointments or hospitalizations.Contact Us:For any questions or concerns Monday-Friday 7:30am-5:00pm, call [phone] (Musculoskeletal Radiology).  Any other time please call [phone] and ask for the musculoskeletal imaging fellow on call.

## 2022-09-07 NOTE — Progress Notes
[facility] HealthMUSCULOSKELETAL RADIOLOGY BRIEF PROCEDURE NOTE	Patient Name: Douglas [name] [facility]: [id number] Procedure Date: 7/22/2024Physicians performing the procedure:Drs. Ahmed Prima, Marcedes Tech MATAKASDIAGNOSIS:CMMLPROCEDURE: [place] guided bone marrow biopsy with moderate sedation     COMPLICATIONS: No immediate post procedural complications. SPECIMENS:2 bone marrow aspirations and 1 core biopsy of the Left iliac bone. A clot sample was also obtained. Sharalyn Ink Cassundra Mckeever, MD7/22/202410:02 AM

## 2022-09-07 NOTE — Anesthesia Procedure Notes
Site prepped  

## 2022-09-07 NOTE — Other
ReviewReview NP: [name]

## 2022-09-07 NOTE — Progress Notes
History & Physical Attestation and Pre-Procedural Moderate Sedation Assessment History & Physical Attestation: This is a non-emergent procedure. I have completed (or reviewed and attested to) a History and Physical Exam written within the last 30 days, which is in the patient record. The patient has been assessed and examined prior to this procedure and no changes were noted unless otherwise documented here: NoneGeneral: Alert and Oriented x 3Heart: RRR Lungs: CTABReview of Systems: Patient does not report chest pain, fevers, chills, shortness of breath. Physician Airway Assessment:Normal: As the physician certified in moderate sedation and based on my review, immediately prior to this procedure, of the airway evaluation and other pertinent documentation in the patient's record, this patient is a suitable candidate for moderate sedation during the planned procedure.Procedural Sedation Risk Assessment: ASA IIAdditional risks, if any, related to procedural sedation are noted here: NoneASA Physical Status Classification Score: Patient is a completely healthy fit patient.Patient has mild systemic disease.Patient has severe systemic disease that is not incapacitating.Patient has incapacitating disease that is a constant threat to life.A moribund patient who is not expected to live 24 hour with or without surgery.

## 2022-09-07 NOTE — Anesthesia Procedure Notes
Dressing applied. 

## 2022-09-08 LAB — MDS PLUS PANEL
BKR CD10+CD33+: 0.1 %
BKR CD10+CD34+: 0 %
BKR CD11B+CD16+: 85.3 %
BKR CD13+CD11B+: 93.7 %
BKR CD14+CD33+: 9.2 %
BKR CD14-CD64+: 85.3 %
BKR CD19+: 7.3 %
BKR CD19+CD38+: 2.3 %
BKR CD19+KAPPA+ MONO GATE: 1 %
BKR CD19+KAPPA+ MYELOID GATE: 9 %
BKR CD19+KAPPA+: 57.4 %
BKR CD19+LAMBDA+ MONO GATE: 1 %
BKR CD19+LAMBDA+ MYELOID GATE: 8.8 %
BKR CD19+LAMBDA+: 36.4 %
BKR CD3+CD16/56+: 2.8 %
BKR CD3+CD16/56-: 49.2 %
BKR CD3+CD4+ MONO GATE: 4.6 %
BKR CD3+CD4+: 29.9 %
BKR CD3+CD4+CD8+: 0.5 %
BKR CD3+CD5+ MONO GATE: 5.1 %
BKR CD3+CD5+ MYELOID GATE: 12.6 % — AB
BKR CD3+CD5+: 49.4 %
BKR CD3+CD8+ MONO GATE: 3.8 %
BKR CD3+CD8+: 13.4 %
BKR CD3-CD16/56+: 35.4 %
BKR CD3-CD4+: 15.7 %
BKR CD33+ MONO GATE: 18.7 %
BKR CD33+: 1.7 %
BKR CD33+CD10+: 0 %
BKR CD33+CD34+: 0.5 %
BKR CD33+HLADR-: 0.4 %
BKR CD34+: 0.8 %
BKR CD34+CD10+: 0.4 %
BKR CD34+CD117+ MONO GATE: 0.9 %
BKR CD34+CD117+: 0 %
BKR CD34+CD33+ MONO GATE: 0.9 %
BKR CD34+CD64+ MONO GATE: 0.9 %
BKR CD34+CD64+: 0.5 %
BKR CD34+HLADR+: 1 %
BKR CD4/CD8 RATIO: 2.23
BKR CD45+CD10+CD33-: 20.7 %
BKR CD45+CD10-CD33+: 31.1 %
BKR CD45+CD11B+CD13+: 94 %
BKR CD45+CD14+CD64+: 32.6 %
BKR CD45+CD16+CD11B+: 90.7 %
BKR CD45+CD16.56+CD3-: 6.6 %
BKR CD45+CD33+HLADR+: 46.4 % (ref 1.005–1.030)
BKR CD45+CD34+CD10+: 29.3 %
BKR CD45+CD34+CD117+: 20.2 % (ref 5.5–7.5)
BKR CD45+CD34+CD33+: 40.1 %
BKR CD45+CD34+CD7+: 53.3 %
BKR CD45+CD56+CD38-: 3.7 % — AB
BKR CD56+: 0.6 %
BKR CD56+CD38+: 3.6 %
BKR CD64+: 94.8 %
BKR CD64+CD14+ MONO GATE: 10.7 %
BKR CD64+CD14+: 0.7 %
BKR CD7+CD117+: 0.1 %
BKR CD7+CD117- MONO GATE: 1.2 %
BKR CD7+CD117-: 58.3 %
BKR HLADR+CD33+: 9.9 %

## 2022-09-08 LAB — FLOW CYTOMETRY - LEUKEMIA/LYMPHOMA/NEOPLASIA, BONE MARROW

## 2022-09-08 NOTE — Anesthesia Pre-Procedure Evaluation
This is a 72 y.o. male scheduled for COLONOSCOPY/EGDUPPER GI ENDOSCOPY; DX, W/WO SPECIMEN COLLECTION, BRUSHING/WASHING (SEP PROC).Review of Systems/ Medical HistoryAnesthesia Evaluation:   Estimated body mass index is 32.59 kg/m? as calculated from the following:  Height as of 08/18/22: 5' 11.14 (1.807 m).  Weight as of 09/04/22: 106.4 kg. CC/HPI: CHART PRE POPULATED BASED ON MEDICAL RECORD. INCOMPLETE REVIEW OF SYSTEMS. 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   Skeletal/Skin:  -Skin and Connective Tissue:  Patient has skin neoplasm (BCC).Gastrointestinal/Genitourinary: -Nutritional Disorders: Pt is obese per BMI definition-obesity (BMI > 30).Hematological/Lymphatic: -Anemia: Patient has anemia.  -Coagulopathy:  He has thrombocytopenia.-Comments: Hematology [birth year] year old man with a history of depression, anxiety, iron deficiency anemia, CLL, CMML (diagnosed 12/2019) who developed mild pancytopenia in 01/2022 followed by severe anemia with Hb 5 iso of iron deficiency and persistent leukopenia/neutropenia 04/07/22 now with marrow done in [facility] under the care of Dr. 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

## 2022-09-08 NOTE — Telephone Encounter
Spoke with patient following [place] guided bone marrow biopsy with sedation on 09/07/22. Patient states recovering well from procedure, no further questions or concerns at this time.

## 2022-09-09 ENCOUNTER — Encounter: Admit: 2022-09-09 | Payer: PRIVATE HEALTH INSURANCE | Attending: Medical Oncology

## 2022-09-09 ENCOUNTER — Encounter: Admit: 2022-09-09 | Payer: PRIVATE HEALTH INSURANCE | Attending: Family

## 2022-09-09 ENCOUNTER — Encounter: Admit: 2022-09-09 | Payer: PRIVATE HEALTH INSURANCE

## 2022-09-09 ENCOUNTER — Inpatient Hospital Stay: Admit: 2022-09-09 | Discharge: 2022-09-09 | Payer: MEDICARE

## 2022-09-09 ENCOUNTER — Ambulatory Visit: Admit: 2022-09-09 | Payer: MEDICARE | Attending: Medical Oncology

## 2022-09-09 ENCOUNTER — Encounter: Admit: 2022-09-09 | Payer: MEDICARE

## 2022-09-09 ENCOUNTER — Telehealth: Admit: 2022-09-09 | Payer: PRIVATE HEALTH INSURANCE

## 2022-09-09 ENCOUNTER — Encounter: Admit: 2022-09-09 | Payer: PRIVATE HEALTH INSURANCE | Attending: Vascular and Interventional Radiology

## 2022-09-09 DIAGNOSIS — C931 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: Secondary | ICD-10-CM

## 2022-09-09 DIAGNOSIS — C9110 Chronic lymphocytic leukemia of B-cell type not having achieved remission: Secondary | ICD-10-CM

## 2022-09-09 DIAGNOSIS — C9200 Acute myeloblastic leukemia, not having achieved remission: Secondary | ICD-10-CM

## 2022-09-09 DIAGNOSIS — F419 Anxiety disorder, unspecified: Secondary | ICD-10-CM

## 2022-09-09 DIAGNOSIS — C4491 Basal cell carcinoma of skin, unspecified: Secondary | ICD-10-CM

## 2022-09-09 DIAGNOSIS — D500 Iron deficiency anemia secondary to blood loss (chronic): Secondary | ICD-10-CM

## 2022-09-09 DIAGNOSIS — I10 Essential (primary) hypertension: Secondary | ICD-10-CM

## 2022-09-09 DIAGNOSIS — F32A Depression: Secondary | ICD-10-CM

## 2022-09-09 DIAGNOSIS — I119 Hypertensive heart disease without heart failure: Secondary | ICD-10-CM

## 2022-09-09 DIAGNOSIS — Z8249 Family history of ischemic heart disease and other diseases of the circulatory system: Secondary | ICD-10-CM

## 2022-09-09 DIAGNOSIS — E782 Mixed hyperlipidemia: Secondary | ICD-10-CM

## 2022-09-09 DIAGNOSIS — N400 Enlarged prostate without lower urinary tract symptoms: Secondary | ICD-10-CM

## 2022-09-09 DIAGNOSIS — R0609 Other forms of dyspnea: Secondary | ICD-10-CM

## 2022-09-09 DIAGNOSIS — H409 Unspecified glaucoma: Secondary | ICD-10-CM

## 2022-09-09 DIAGNOSIS — Z01818 Encounter for other preprocedural examination: Secondary | ICD-10-CM

## 2022-09-09 LAB — CBC WITH AUTO DIFFERENTIAL
BKR WAM ABSOLUTE IMMATURE GRANULOCYTES.: >60 mL/min/{1.73_m2} (ref >=60)
BKR WAM ABSOLUTE NRBC (2 DEC): 0 x 1000/ÂµL (ref 0.00–1.00)
BKR WAM ANC (ABSOLUTE NEUTROPHIL COUNT): 0.04 x 1000/ÂµL — ABNORMAL LOW (ref 2.00–7.60)
BKR WAM BASOPHIL ABSOLUTE COUNT.: 0.8
BKR WAM BASOPHILS: 0.6 mg/dL (ref <=1.2)
BKR WAM HEMATOCRIT (2 DEC): 23.3 % — ABNORMAL LOW (ref 38.50–50.00)
BKR WAM HEMOGLOBIN: 7.9 g/dL — ABNORMAL LOW (ref 13.2–17.1)
BKR WAM MCH (PG): 28 pg (ref 27.0–33.0)
BKR WAM MCHC: 33.9 g/dL (ref 31.0–36.0)
BKR WAM MCV: 82.6 fL (ref 80.0–100.0)
BKR WAM NEUTROPHILS: 9.2 mg/dL (ref 8.8–10.2)
BKR WAM NUCLEATED RED BLOOD CELLS: 0 % (ref 0.0–1.0)
BKR WAM PLATELETS: 9 x1000/ÂµL — CL (ref 150–420)
BKR WAM RDW-CV: 14.4 % (ref 11.0–15.0)
BKR WAM RED BLOOD CELL COUNT.: 2.82 M/ÂµL — ABNORMAL LOW (ref 4.00–6.00)
BKR WAM WHITE BLOOD CELL COUNT: 0.4 x1000/ÂµL — ABNORMAL LOW (ref 4.0–11.0)

## 2022-09-09 LAB — COMPREHENSIVE METABOLIC PANEL
BKR A/G RATIO: 1 (ref 1.0–2.2)
BKR ALANINE AMINOTRANSFERASE (ALT): 20 U/L (ref 9–59)
BKR ALBUMIN: 3.9 g/dL (ref 3.6–5.1)
BKR ALKALINE PHOSPHATASE: 111 U/L (ref 9–122)
BKR ANION GAP: 13 (ref 7–17)
BKR ASPARTATE AMINOTRANSFERASE (AST): 16 U/L (ref 10–35)
BKR AST/ALT RATIO: 0.8
BKR BILIRUBIN TOTAL: 0.6 mg/dL (ref <=1.2)
BKR BLOOD UREA NITROGEN: 13 mg/dL (ref 8–23)
BKR BUN / CREAT RATIO: 12.3 (ref 8.0–23.0)
BKR CALCIUM: 9.2 mg/dL (ref 8.8–10.2)
BKR CHLORIDE: 105 mmol/L (ref 98–107)
BKR CO2: 20 mmol/L (ref 20–30)
BKR CREATININE: 1.06 mg/dL (ref 0.40–1.30)
BKR EGFR, CREATININE (CKD-EPI 2021): >60 mL/min/{1.73_m2} (ref >=60)
BKR GLOBULIN: 4 g/dL — ABNORMAL HIGH (ref 2.0–3.9)
BKR GLUCOSE: 98 mg/dL (ref 70–100)
BKR POTASSIUM: 4.2 mmol/L — ABNORMAL LOW (ref 3.3–5.3)
BKR PROTEIN TOTAL: 7.9 g/dL (ref 5.9–8.3)
BKR SODIUM: 138 mmol/L (ref 136–144)

## 2022-09-09 LAB — BILIRUBIN, TOTAL AND DIRECT
BKR BILIRUBIN DIRECT: 0.2 mg/dL (ref <=0.3)
BKR BILIRUBIN TOTAL: 0.6 mg/dL (ref <=1.2)

## 2022-09-09 LAB — IMMATURE PLATELET FRACTION (BH GH LMW YH)
BKR WAM IPF, ABSOLUTE: 0.1 x1000/ÂµL (ref 7–<20.0)
BKR WAM IPF, ABSOLUTE: 0.9 x1000/ÂµL (ref <20.0)
BKR WAM IPF: 1.6 % (ref 1.2–8.6)
BKR WAM IPF: 3.7 % (ref 1.2–8.6)

## 2022-09-09 LAB — PLATELET COUNT: BKR WAM PLATELETS: 23 x1000/ÂµL — ABNORMAL LOW (ref 150–420)

## 2022-09-09 LAB — LACTATE DEHYDROGENASE: BKR LACTATE DEHYDROGENASE: 225 U/L (ref 122–241)

## 2022-09-09 NOTE — Telephone Encounter
Called pt, today's COLON/EGD appt has been cancelled, Pt platelet count and his white blood cell count are very low, pt will call back to R/S after he is cleared from hematology

## 2022-09-09 NOTE — Progress Notes
Pt arrives ambulatory with supportive spouse for labs/poss and follow up with [name].  Continues transplant work upReports persistent fatigue.No fevers, stable VS.  Poor appetite, no N/V/D.  No new complaints of pain.+Weakness, no falls, safety maintained.RDL Hickman dsg C/D/I, labs obtained and reviewed-WBC: 0.4; Hgb: 7.9; Plts: 9; ANC: 0.04Transfused plts x1 followed by 1 unit RBCs, well tolerated without premedications.  Post plt 23.Discharged ambulatory in stable condition.  Will RTC 7/26 for labs/poss and follow up with Dr. Sherrell Puller.

## 2022-09-09 NOTE — Telephone Encounter
-----   Message from Tawana Scale, MD sent at 09/08/2022  3:52 PM EDT -----Regarding: Patient scheduled for endoscopy 7/24Hi,I am reviewing my procedure schedule for tomorrow (09/09/22) at [facility]. His platelet count and his white blood cell count are very low. I discussed the case with one of his hematologists, Dr. Chrystine Oiler, and we both agreed that it would be ideal to postpone the procedure.Would you be able to postpone the procedure?Anne Shutter

## 2022-09-10 ENCOUNTER — Encounter: Admit: 2022-09-10 | Payer: PRIVATE HEALTH INSURANCE | Attending: Medical Oncology

## 2022-09-10 ENCOUNTER — Other Ambulatory Visit: Admit: 2022-09-10 | Payer: MEDICARE | Attending: Clinical Pathology

## 2022-09-10 DIAGNOSIS — E782 Mixed hyperlipidemia: Secondary | ICD-10-CM

## 2022-09-10 DIAGNOSIS — Z79899 Other long term (current) drug therapy: Secondary | ICD-10-CM

## 2022-09-10 DIAGNOSIS — N400 Enlarged prostate without lower urinary tract symptoms: Secondary | ICD-10-CM

## 2022-09-10 DIAGNOSIS — C9200 Acute myeloblastic leukemia, not having achieved remission: Secondary | ICD-10-CM

## 2022-09-10 DIAGNOSIS — H409 Unspecified glaucoma: Secondary | ICD-10-CM

## 2022-09-10 DIAGNOSIS — C931 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: Secondary | ICD-10-CM

## 2022-09-10 DIAGNOSIS — Z7982 Long term (current) use of aspirin: Secondary | ICD-10-CM

## 2022-09-10 DIAGNOSIS — D469 Myelodysplastic syndrome, unspecified: Secondary | ICD-10-CM

## 2022-09-10 DIAGNOSIS — I119 Hypertensive heart disease without heart failure: Secondary | ICD-10-CM

## 2022-09-10 DIAGNOSIS — Z538 Procedure and treatment not carried out for other reasons: Secondary | ICD-10-CM

## 2022-09-10 DIAGNOSIS — D0422 Carcinoma in situ of skin of left ear and external auricular canal: Secondary | ICD-10-CM

## 2022-09-10 MED FILL — VORICONAZOLE 200 MG TABLET: 200 mg | ORAL | 15 days supply | Qty: 30 | Fill #5 | Status: CP

## 2022-09-10 NOTE — Progress Notes
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 Sherrell Puller INTERIM HISTORY: Douglas [name] is here to discuss transplant admission scheduled for next week. Pre-transplant workup included [place] scans. Abdominal scan showed a lesion in the L2 vertebrae which is non-specific but an MRI was suggested. This was obtained on July 8th and showed a 3 cm lesion of  unclear origin possibly a hemangioma. I spoke with [facility] radiologist to review this. This is most likely hemangioma although this is not certain. PFT in July looked normal, cardiac ECHO with a EF of 64%.  He was seen by the leukemia service who has concerns for possible gingival hyperplasia. His last cycle of chemo was June 4th,   he is not yet recovered his counts and is receiving transfusions. We have not been able to schedule a colonoscopy due to low blood counts. Bone marrow biopsy was performed Monday and results are pending. Flow cytometry did not show an increase in blasts. Overall, he is doing well. His energy levels are okay. He take naps occasionally at day time. He reports gums issues. He saw the dentist recently. He received last platelets transfusion on Sunday. He reports bruising on multiple sites. He has been eating and drinking well. He denies cold, fever, or interim infection. We reviewed the current medication list and made the necessary changes to chart.  HPI:   Douglas [name] has a hx of Hypertension, hyperlipidemia, basal cell cancer of the skin, iron deficiency anemia.  The patient was evaluated for cytopenias in 2021 in [place].  Initial evaluation from [facility], source records are not available today.  Per the patient's subsequent records, he initially presented with leukopenia, thrombocytopenia, and monocytosis.  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  Initial bone marrow was performed on August 24, 2019, was reviewed at [facility] with findings of multilineage dysplasia.  The marrow was 40% cellular.  Megakaryocytes showed frequent dysplasia with  hyperchromatic forms and clustering.  Blasts were not increased.  A FISH panel for MDS  loci was negative.  Karyotype was normal.  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  The patient was observed and was without any treatment or transfusions.  In 2023, he had an acute drop in hemoglobin, was found to be iron deficient.  Workup for this did not show a clear focus of blood loss or bleeding.  The patient has continued evaluation and established care with Dr. Sherrell Puller in June 2023 as he spends some summers in [place].  Of note, there is a label of CLL in the patient's chart, which I suspect was a typo, and find no evidence that support that diagnosis.  Dr. Dema Severin assessment in June 2023 per the CPSS molecular score being considered intermediate one with a low risk of development of leukemia and median survival over 5 years.  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  The patient had a marrow performed at [facility] on 29th with CD34 stain showing 10% to 15% blasts focally more than 20%.  Marrow was hypercellular at 70%.  Dysplastic megakaryocytes again appreciated.  Molecular studies are pending.  Given the findings, treatment with the decitabine and  venetoclax was recommended.  A referral has been placed for urgent EGD colonoscopy given the low iron concern for occult source of bleeding.He started treatment last week which he is tolerated well. He received 5-6 red blood cell transfusion in the past several month. He was suffering weakness and dyspnea which is now improved after transfusion. He does not have a significant infectious history, he did have COVID-19 in the past from which he recovered. He has hypertension and hyperlipidemia managed by cardiologist in [place]. He reports a negative stress test in the past. He has had number of skin cancers and has had several MOHS procedures. He is followed with a urologist for some BPH and abnormal PSA. He has had 2 prostate biopsies which was negative. He carries a diagnosis of iron deficiency and require transfusion in [place] after a significant and acute drop in hemoglobin. There was no clinical evidence of bleeding. Endoscopy and colonoscopy are planned to exclude a source of bleeding. Douglas [name] is a 72 yo gentlemen that lives with is wife in [place].  He is currently spending the summer in [place] visiting family.  He has 2 daughters and 5 grandchildren.  Retired, having worked for [employer].  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  He is a social drinker of 1-2 beers per week, denies illicit drug use and is a non smoker.  Covid-19 vaccine series completed. 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)   CLL (chronic lymphocytic leukemia) (HC Code)   Depression   DOE (dyspnea on exertion)   Enlarged prostate   Essential hypertension   Family history of ischemic heart disease   Glaucoma   Hypertensive heart disease without heart failure   Mixed hyperlipidemia  Surgical HistoryPast Surgical History: Procedure Laterality Date  ADENOIDECTOMY    BONE MARROW BIOPSY    PROSTATE BIOPSY    TONSILLECTOMY    VASCULAR SURGERY   AllergiesNo Known AllergiesCurrent Outpatient Medications:   acyclovir (ZOVIRAX) 400 mg tablet, Take 1 tablet (400 mg total) by mouth 2 (two) times daily., Disp: 60 tablet, Rfl: 5  amLODIPine (NORVASC) 10 mg tablet, Take 1 tablet (10 mg total) by mouth., Disp: , Rfl:   amLODIPine (NORVASC) 10 mg tablet, Take 1 tablet (10 mg total) by mouth daily., Disp: , Rfl:   aspirin 81 mg EC delayed release tablet, Take 1 tablet (81 mg total) by mouth. (Patient not taking: Reported on 08/05/2022), Disp: , Rfl:   atorvastatin (LIPITOR) 40 mg tablet, take 1 tablet by mouth at bedtime, Disp: , Rfl:   cholecalciferol (VITAMIN D-3) 50 mcg (2,000 unit) capsule, Take 1 capsule (2,000 Units total) by mouth daily. (Patient not taking: Reported on 07/01/2022), Disp: , Rfl:   coenzyme Q10 300 mg Cap, Take 300 mg by mouth. (Patient not taking: Reported on 07/01/2022), Disp: , Rfl:   finasteride (PROSCAR) 5 mg tablet, TAKE 1 TABLET BY MOUTH DAILY FOR ENLARGED PROSTATE WITH URINATION PROBLEM TAKE WITH OR WITHOUT MEALS, Disp: , Rfl:   latanoprost (XALATAN) 0.005 % ophthalmic solution, Place 1 drop into both eyes nightly., Disp: , Rfl:   levoFLOXacin (LEVAQUIN) 500 mg tablet, Take 1 tablet (500 mg total) by mouth daily.,  Disp: , Rfl:   LIDOCAINE 2 % solution, APPLY TO AREAS AS DIRECTED EVERY 4 HOURS AS NEEDED, Disp: , Rfl:   lisinopriL (PRINIVIL,ZESTRIL) 40 mg tablet, take 1 tablet by mouth every day in the morning, Disp: , Rfl:   magnesium oxide (MAG-OX) 400 mg (241.3 mg magnesium) tablet, Take 1 tablet (400 mg total) by mouth., Disp: , Rfl:   metoprolol succinate XL (TOPROL-XL) 50 mg 24 hr tablet, Take 1 tablet (50 mg total) by mouth daily., Disp: , Rfl:   ondansetron (ZOFRAN-ODT) 8 mg disintegrating tablet, Dissolve 1 tablet (8 mg total) by mouth every 12 (twelve) hours as needed for nausea or vomiting., Disp: 30 tablet, Rfl: 1  voriconazole (VFEND) 200 mg tablet, Take 1 tablet (200 mg total) by mouth every 12 (twelve) hours., Disp: 30 tablet, Rfl: 5Past Medical History: Diagnosis Date  Anxiety   Basal cell carcinoma   Benign prostatic hyperplasia without lower urinary tract symptoms 08/29/2015  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 (HC Code)   CLL (chronic lymphocytic leukemia) (HC Code)   Depression   DOE (dyspnea on exertion)   Enlarged prostate   Essential hypertension   Family history of ischemic heart disease   Glaucoma   Hypertensive heart disease without heart failure   Mixed hyperlipidemia  Family History Problem Relation Age of Onset  Breast cancer Mother   Liver cancer Mother   Heart disease Father   Prostate cancer Father   Breast cancer Sister   Thyroid disease Sister   Hearing loss Sister   Diabetes Daughter   No Known Problems Daughter   No Known Problems Grandchild  Social History Socioeconomic History  Marital status: Married   Spouse name: Not on file  Number of children: Not on file  Years of education: Not on file  Highest education level: Not on file Occupational History  Not on file Tobacco Use  Smoking status: Never  Smokeless tobacco: Never Vaping Use  Vaping Use: Never used Substance and Sexual Activity  Alcohol use: Yes   Alcohol/week: 2.0 standard drinks of alcohol   Types: 2 Cans of beer per week   Comment: social drinker once a week  Drug use: Never  Sexual activity: Not on file Other Topics Concern  Not on file Social History Narrative  Not on file Social Determinants of Health Financial Resource Strain: Low Risk  (08/05/2021)  Overall Financial Resource Strain (CARDIA)   Difficulty of Paying Living Expenses: Not very hard Food Insecurity: No Food Insecurity (08/12/2022)  Hunger Vital Sign   Worried About Running Out of Food in the Last Year: Never true   Ran Out of Food in the Last Year: Never true Transportation Needs: No Transportation Needs (08/12/2022)  PRAPARE - Designer, jewellery (Medical): No   Lack of Transportation (Non-Medical): No Physical Activity: Not on file Stress: Not on file Social Connections: Not on file Intimate Partner Violence: Not on file Housing Stability: Low Risk  (08/12/2022)  Housing Stability   Housing Stability: I have a steady place to live   Housing Stability: Not on file EXAM:Temp:  [98.4 ?F (36.9 ?C)] 98.4 ?F (36.9 ?C)Pulse:  [94] 94Resp:  [20] 20BP: (107)/(69) 107/69SpO2:  [98 %] 98 %General Appearance:He is heavy set, appears wellHEENT:  Mucous membranes moist, No oral lesions, no erythema, teeth are adequate repair prominent gingival hyperplasia notedLungs: ClearCor: RRR, no m/r/gAbd: soft nontender, no hepatosplenomegaly, Extr: trace to 1+ edema, petechiae on the feet and toes. Skin: Some actinic changesLymph Nodes -  no palpable adenopathy. Neurologic: mentation appears normal, normal speech, strength symmetric. LABSLABSLab Results Component Value Date  WBC 0.4 (L) 09/09/2022  HGB 7.9 (L) 09/09/2022  HCT 23.30 (L) 09/09/2022  MCV 82.6 09/09/2022  PLT 9 (LL) 09/09/2022 Comprehensive Metabolic Panel:Lab Results Component Value Date  GLU 98 09/09/2022  BUN 13 09/09/2022  CREATININE 1.06 09/09/2022  NA 138 09/09/2022  K 4.2 09/09/2022  CL 105 09/09/2022  CO2 20 09/09/2022  ALBUMIN 3.9 09/09/2022  PROT 7.9 09/09/2022  BILITOT 0.6 09/09/2022  BILITOT 0.6 09/09/2022  ALKPHOS 111 09/09/2022  ALT 20 09/09/2022  GLOB 4.0 (H) 09/09/2022  CALCIUM 9.2 09/09/2022 Results in Past 7 DaysResult Component Current Result LD 225 (09/09/2022) ]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  His disease is characterized by minor cytopenias over a long period of observation with recently worsened anemia, thrombocytopenia, and a marrow with increased blasts consistent with disease progression. 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 Therapy was   started with decitabine and venetoclax, and transplantation  recommended given the now increased risk of shorten survival. The patient has had a formal  search of the registry and a 10/10 match was identified. I spent an hour with Rodena Goldmann and his wife today discussing our plan for admission for matched unrelated donor allograft to treat his CMML.  We are waiting for bone marrow results as he is not recovered from his last treatment. 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 If the bone marrow shows this he will need alternate therapy and understand transplant would be postponed. If the bone marrow is hypoplastic we may choose to proceed with reduced intensity transplant prior to recovery. Plan to discuss this with Doctor Sherrell Puller. The plan would be to admit on August 2nd for reduced intensity conditioning with melphalan and fludarabine. A Mel dose of 70mg /m2 would be employed in that event and the dose of ptcy would be modified to 37.5mg /kg x 2.   Today, we reviewed the nature of the conditioning regimen and anticipated potential side effects including mucositis, fever, and need for transfusions. We reviewed the graft-versus-host disease preventative regimen of tacrolimus and the follow up schedule. The risks of GvHD, infection and need for prolonged immunosuppression were reviewed.   Given that he has not shown signs of GI bleeding I think the best course of action is to defer colonoscopy until a later date. We plan to follow up the area of the spine with in MRI in 3-6 months.  Patient and his wife asked questions that were answered and voiced agreement in proceeding. On the day of this patient's encounter, a total of 60 minutes was personally spent by me.  This does not include any resident/fellow teaching time, or any time spent performing a procedural service. Dr. Chrystine Oiler MD Scribed for Chrystine Oiler, MD by Simona Huh, medical scribe July 24, 2024The documentation recorded by the scribe accurately reflects the services I personally performed and the decisions made by me. I reviewed and confirmed all material entered and/or pre-charted by the scribe.

## 2022-09-11 ENCOUNTER — Ambulatory Visit: Admit: 2022-09-11 | Payer: MEDICARE | Attending: Hematology & Oncology

## 2022-09-11 ENCOUNTER — Inpatient Hospital Stay: Admit: 2022-09-11 | Discharge: 2022-09-11 | Payer: MEDICARE

## 2022-09-11 ENCOUNTER — Encounter: Admit: 2022-09-11 | Payer: PRIVATE HEALTH INSURANCE | Attending: Hematology & Oncology

## 2022-09-11 ENCOUNTER — Encounter: Admit: 2022-09-11 | Payer: PRIVATE HEALTH INSURANCE | Attending: Vascular and Interventional Radiology

## 2022-09-11 ENCOUNTER — Encounter: Admit: 2022-09-11 | Payer: PRIVATE HEALTH INSURANCE

## 2022-09-11 DIAGNOSIS — K121 Other forms of stomatitis: Secondary | ICD-10-CM

## 2022-09-11 DIAGNOSIS — C931 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: Secondary | ICD-10-CM

## 2022-09-11 DIAGNOSIS — C9200 Acute myeloblastic leukemia, not having achieved remission: Secondary | ICD-10-CM

## 2022-09-11 LAB — LACTATE DEHYDROGENASE: BKR LACTATE DEHYDROGENASE: 228 U/L (ref 122–241)

## 2022-09-11 LAB — COMPREHENSIVE METABOLIC PANEL
BKR A/G RATIO: 1 (ref 1.0–2.2)
BKR ALANINE AMINOTRANSFERASE (ALT): 19 U/L (ref 9–59)
BKR ALBUMIN: 3.8 g/dL (ref 3.6–5.1)
BKR ALKALINE PHOSPHATASE: 104 U/L (ref 9–122)
BKR ANION GAP: 11 (ref 7–17)
BKR ASPARTATE AMINOTRANSFERASE (AST): 16 U/L (ref 10–35)
BKR AST/ALT RATIO: 0.8
BKR BILIRUBIN TOTAL: 0.7 mg/dL (ref <=1.2)
BKR BLOOD UREA NITROGEN: 12 mg/dL — ABNORMAL LOW (ref 8–23)
BKR BUN / CREAT RATIO: 12.4 (ref 8.0–23.0)
BKR CALCIUM: 9.4 mg/dL (ref 8.8–10.2)
BKR CHLORIDE: 104 mmol/L (ref 98–107)
BKR CO2: 21 mmol/L (ref 20–30)
BKR CREATININE: 0.97 mg/dL (ref 0.40–1.30)
BKR EGFR, CREATININE (CKD-EPI 2021): >60 mL/min/{1.73_m2} (ref >=60)
BKR GLOBULIN: 4 g/dL — ABNORMAL HIGH (ref 2.0–3.9)
BKR GLUCOSE: 89 mg/dL (ref 70–100)
BKR POTASSIUM: 4.2 mmol/L — ABNORMAL LOW (ref 3.3–5.3)
BKR PROTEIN TOTAL: 7.8 g/dL (ref 5.9–8.3)
BKR SODIUM: 136 mmol/L — ABNORMAL LOW (ref 136–144)

## 2022-09-11 LAB — CBC WITH AUTO DIFFERENTIAL
BKR WAM ABSOLUTE NRBC (2 DEC): 0 x 1000/ÂµL (ref 0.00–1.00)
BKR WAM ANC (ABSOLUTE NEUTROPHIL COUNT): 0.04 x 1000/ÂµL — ABNORMAL LOW (ref 2.00–7.60)
BKR WAM HEMATOCRIT (2 DEC): 24.7 % — ABNORMAL LOW (ref 38.50–50.00)
BKR WAM HEMOGLOBIN: 8.4 g/dL — ABNORMAL LOW (ref 13.2–17.1)
BKR WAM MCH (PG): 28.1 pg (ref 27.0–33.0)
BKR WAM MCHC: 34 g/dL (ref 31.0–36.0)
BKR WAM MCV: 82.6 fL (ref 80.0–100.0)
BKR WAM NUCLEATED RED BLOOD CELLS: 0 % (ref 0.0–1.0)
BKR WAM PLATELETS: 14 x1000/ÂµL — ABNORMAL LOW (ref 150–420)
BKR WAM RDW-CV: 14.6 % (ref 11.0–15.0)
BKR WAM RED BLOOD CELL COUNT.: 2.99 M/ÂµL — ABNORMAL LOW (ref 4.00–6.00)
BKR WAM WHITE BLOOD CELL COUNT: 0.5 x1000/ÂµL — ABNORMAL LOW (ref 4.0–11.0)

## 2022-09-11 LAB — IMMATURE PLATELET FRACTION (BH GH LMW YH)
BKR WAM IPF, ABSOLUTE: 0.3 x1000/ÂµL (ref 7–<20.0)
BKR WAM IPF: 2.3 % (ref 1.2–8.6)

## 2022-09-11 LAB — BILIRUBIN, TOTAL AND DIRECT
BKR BILIRUBIN DIRECT: 0.2 mg/dL (ref <=0.3)
BKR BILIRUBIN TOTAL: 0.7 mg/dL (ref <=1.2)

## 2022-09-11 MED ORDER — CRESEMBA 186 MG CAPSULE
186 mg | ORAL_CAPSULE | ORAL | 1 refills | 30.00 days | Status: SS
Start: 2022-09-11 — End: ?

## 2022-09-11 MED ORDER — DEXAMETHASONE 0.5 MG/5 ML ORAL SOLUTION
0.55 mg/5 mL | 1 refills | Status: SS
Start: 2022-09-11 — End: ?

## 2022-09-11 MED ORDER — CHLORHEXIDINE GLUCONATE 0.12 % MOUTHWASH
0.12 | Freq: Two times a day (BID) | ORAL | 1 refills | Status: SS
Start: 2022-09-11 — End: ?

## 2022-09-11 MED ORDER — CRESEMBA 186 MG CAPSULE
186 mg | ORAL_CAPSULE | ORAL | 1 refills | Status: AC
Start: 2022-09-11 — End: 2022-09-11
  Filled 2022-10-10: qty 56, 28d supply, fill #0

## 2022-09-11 NOTE — Progress Notes
Pt here for labs/possible transfusion(s) + MD f/u. Reports feeling fair overall, did not offer any acute complaints. Continues w/ fatigue and +SOB/+DOE that is unchanged from his baseline. VSS. RDL [place]ick[place] dated 7/21 c/d/i, positive brisk blood return noted. Labs drawn per orders and revealed:WBC=0.5H&H=8.4/24.70PLTs=14kANC=0.04 (neutropenic precautions reinforced, pt denies any active s/sx of infection at this time; he reports he is taking his prophylacxis meds as ordered - Acyclovir, Levaquin, and Vori)Pt received 1 bag of PLTs (no pre-meds) per parameters w/o incident. RDL Hickman flushed w/ 10 cc NS and capped off w/ alcohol impregnated cap. Pt seen by Dr. Sherrell Puller. All questions/ comments/ concerns addressed. Pt due to return to clinic on Sunday for labs/possible transfusion(s) + Hickman line care. Pt discharged to home w/ spouse ambulatory independent in stable condition.Magnus Ivan, RN

## 2022-09-12 ENCOUNTER — Ambulatory Visit: Admit: 2022-09-12 | Payer: MEDICARE

## 2022-09-12 MED ORDER — ANIDULAFUNGIN 100 MG INTRAVENOUS SOLUTION
100 | Freq: Once | INTRAVENOUS | Status: DC
Start: 2022-09-12 — End: 2022-09-13

## 2022-09-13 ENCOUNTER — Inpatient Hospital Stay: Admit: 2022-09-13 | Discharge: 2022-09-13 | Payer: MEDICARE

## 2022-09-13 ENCOUNTER — Encounter: Admit: 2022-09-13 | Payer: PRIVATE HEALTH INSURANCE

## 2022-09-13 DIAGNOSIS — C931 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: Secondary | ICD-10-CM

## 2022-09-13 DIAGNOSIS — Z8249 Family history of ischemic heart disease and other diseases of the circulatory system: Secondary | ICD-10-CM

## 2022-09-13 DIAGNOSIS — N400 Enlarged prostate without lower urinary tract symptoms: Secondary | ICD-10-CM

## 2022-09-13 DIAGNOSIS — C4491 Basal cell carcinoma of skin, unspecified: Secondary | ICD-10-CM

## 2022-09-13 DIAGNOSIS — R0609 Other forms of dyspnea: Secondary | ICD-10-CM

## 2022-09-13 DIAGNOSIS — H409 Unspecified glaucoma: Secondary | ICD-10-CM

## 2022-09-13 DIAGNOSIS — F32A Depression: Secondary | ICD-10-CM

## 2022-09-13 DIAGNOSIS — C9110 Chronic lymphocytic leukemia of B-cell type not having achieved remission: Secondary | ICD-10-CM

## 2022-09-13 DIAGNOSIS — I10 Essential (primary) hypertension: Secondary | ICD-10-CM

## 2022-09-13 DIAGNOSIS — F419 Anxiety disorder, unspecified: Secondary | ICD-10-CM

## 2022-09-13 DIAGNOSIS — I119 Hypertensive heart disease without heart failure: Secondary | ICD-10-CM

## 2022-09-13 DIAGNOSIS — Z79624 Long term (current) use of inhibitors of nucleotide synthesis: Secondary | ICD-10-CM

## 2022-09-13 DIAGNOSIS — Z79899 Other long term (current) drug therapy: Secondary | ICD-10-CM

## 2022-09-13 DIAGNOSIS — C9200 Acute myeloblastic leukemia, not having achieved remission: Secondary | ICD-10-CM

## 2022-09-13 DIAGNOSIS — E782 Mixed hyperlipidemia: Secondary | ICD-10-CM

## 2022-09-13 LAB — COMPREHENSIVE METABOLIC PANEL
BKR A/G RATIO: 1 (ref 1.0–2.2)
BKR ALANINE AMINOTRANSFERASE (ALT): 20 U/L (ref 9–59)
BKR ALBUMIN: 3.5 g/dL — ABNORMAL LOW (ref 3.6–5.1)
BKR ALKALINE PHOSPHATASE: 97 U/L (ref 9–122)
BKR ANION GAP: 12 % — ABNORMAL LOW (ref 7–17)
BKR ASPARTATE AMINOTRANSFERASE (AST): 21 U/L (ref 10–35)
BKR AST/ALT RATIO: 1.1
BKR BILIRUBIN TOTAL: 0.5 mg/dL (ref <=1.2)
BKR BLOOD UREA NITROGEN: 11 mg/dL (ref 8–23)
BKR BUN / CREAT RATIO: 13.4 % (ref 8.0–23.0)
BKR CALCIUM: 8.5 mg/dL — ABNORMAL LOW (ref 8.8–10.2)
BKR CHLORIDE: 104 mmol/L (ref 98–107)
BKR CO2: 20 mmol/L (ref 20–30)
BKR CREATININE: 0.82 mg/dL (ref 0.40–1.30)
BKR EGFR, CREATININE (CKD-EPI 2021): >60 mL/min/{1.73_m2} (ref >=60)
BKR GLOBULIN: 3.5 g/dL (ref 2.0–3.9)
BKR GLUCOSE: 117 mg/dL — ABNORMAL HIGH (ref 70–100)
BKR POTASSIUM: 3.5 mmol/L (ref 3.3–5.3)
BKR PROTEIN TOTAL: 7 g/dL — ABNORMAL LOW (ref 5.9–8.3)
BKR SODIUM: 136 mmol/L (ref 136–144)

## 2022-09-13 LAB — CBC WITH AUTO DIFFERENTIAL
BKR WAM ABSOLUTE LYMPHOCYTE COUNT.: >60 mL/min/{1.73_m2} (ref >=60)
BKR WAM ABSOLUTE NRBC (2 DEC): 0 x 1000/ÂµL (ref 0.00–1.00)
BKR WAM ANC (ABSOLUTE NEUTROPHIL COUNT): 0.04 x 1000/ÂµL — ABNORMAL LOW (ref 2.00–7.60)
BKR WAM EOSINOPHILS: 21 U/L (ref 10–35)
BKR WAM HEMATOCRIT (2 DEC): 22.3 % — ABNORMAL LOW (ref 38.50–50.00)
BKR WAM HEMOGLOBIN: 7.7 g/dL — ABNORMAL LOW (ref 13.2–17.1)
BKR WAM IMMATURE GRANULOCYTES: 1 (ref 1.0–2.2)
BKR WAM MCH (PG): 28.4 pg (ref 27.0–33.0)
BKR WAM MCHC: 34.5 g/dL (ref 31.0–36.0)
BKR WAM MCV: 82.3 fL (ref 80.0–100.0)
BKR WAM MPV: 3.5 g/dL — ABNORMAL LOW (ref 3.6–5.1)
BKR WAM NEUTROPHILS: 0.5 mg/dL (ref <=1.2)
BKR WAM NUCLEATED RED BLOOD CELLS: 0 % (ref 0.0–1.0)
BKR WAM PLATELETS: 18 x1000/ÂµL — ABNORMAL LOW (ref 150–420)
BKR WAM RDW-CV: 14.7 % (ref 11.0–15.0)
BKR WAM RED BLOOD CELL COUNT.: 2.71 M/ÂµL — ABNORMAL LOW (ref 4.00–6.00)
BKR WAM WHITE BLOOD CELL COUNT: 0.3 x1000/ÂµL — ABNORMAL LOW (ref 4.0–11.0)

## 2022-09-13 LAB — IMMATURE PLATELET FRACTION (BH GH LMW YH)
BKR WAM IPF, ABSOLUTE: 0.3 x1000/ÂµL (ref <20.0)
BKR WAM IPF: 1.5 % (ref 1.2–8.6)

## 2022-09-13 LAB — LACTATE DEHYDROGENASE: BKR LACTATE DEHYDROGENASE: 202 U/L (ref 122–241)

## 2022-09-13 MED ORDER — ANIDULAFUNGIN 100 MG IVPB
INTRAVENOUS | Status: DC
Start: 2022-09-13 — End: 2022-09-13

## 2022-09-13 MED ORDER — ANIDULAFUNGIN 100 MG IVPB
INTRAVENOUS | Status: SS
Start: 2022-09-13 — End: ?

## 2022-09-13 MED ORDER — SODIUM CHLORIDE 0.9 % INTRAVENOUS SOLUTION
Freq: Once | INTRAVENOUS | Status: DC | PRN
Start: 2022-09-13 — End: 2022-09-18

## 2022-09-13 MED ORDER — FAMOTIDINE 4 MG/ML IN 0.9% SODIUM CHLORIDE (ADULT)
Freq: Once | INTRAVENOUS | Status: DC | PRN
Start: 2022-09-13 — End: 2022-09-18

## 2022-09-13 MED ORDER — SODIUM CHLORIDE 0.9 % BOLUS (NEW BAG)
0.9 | Freq: Once | INTRAVENOUS | Status: DC | PRN
Start: 2022-09-13 — End: 2022-09-18

## 2022-09-13 MED ORDER — DIPHENHYDRAMINE 50 MG/ML INJECTION (WRAPPED E-RX)
50 | Freq: Once | INTRAVENOUS | Status: DC | PRN
Start: 2022-09-13 — End: 2022-09-18

## 2022-09-13 MED ORDER — EPINEPHRINE 0.3 MG/0.3 ML INJECTION, AUTO-INJECTOR
0.3 | INTRAMUSCULAR | Status: DC | PRN
Start: 2022-09-13 — End: 2022-09-18

## 2022-09-13 MED ORDER — ACETAMINOPHEN 325 MG TABLET
325 | Freq: Once | ORAL | Status: DC
Start: 2022-09-13 — End: 2022-09-18

## 2022-09-13 MED ORDER — DIPHENHYDRAMINE 25 MG CAPSULE
25 mg | Freq: Once | ORAL | Status: DC
Start: 2022-09-13 — End: 2022-09-18

## 2022-09-13 MED ORDER — ANIDULAFUNGIN 100 MG IVPB
Freq: Once | INTRAVENOUS | Status: CP
Start: 2022-09-13 — End: ?
  Administered 2022-09-13: 14:00:00 130.000 mL/h via INTRAVENOUS

## 2022-09-13 MED ORDER — ALBUTEROL SULFATE 2.5 MG/3 ML (0.083 %) SOLUTION FOR NEBULIZATION
2.530.083 mg /3 mL (0.083 %) | RESPIRATORY_TRACT | Status: DC | PRN
Start: 2022-09-13 — End: 2022-09-18

## 2022-09-13 MED ORDER — HYDROCORTISONE SOD SUCCINATE (PF) 100 MG/2 ML SOLUTION FOR INJECTION
100 | Freq: Once | INTRAVENOUS | Status: DC | PRN
Start: 2022-09-13 — End: 2022-09-18

## 2022-09-13 NOTE — Progress Notes
Temp:  [97.8 ?F (36.6 ?C)] 97.8 ?F (36.6 ?C)Pulse:  [80] 80Resp:  [18] 18BP: (109)/(66) 109/66SpO2:  [100 %] 100 %CMML/AML Last tx: C2D1 Venetoclax+ Decitabine 07/21/22 Allergies Allergen Reactions  Voriconazole Other (See Comments)   Gingival edema/redness making it difficult to bite down  AML; Plan for sct 09/18/22 aaoX4, with Wife CarolPt denies having  reaction to blood products reactions, declines tylenol/benadryl.RDLH  + BR/flush; dressing/claves changed to biopatch+ IV 3000 Taken off voriconazole due to gingival rednss/edema/tenderness.Using liquid dexamethasone and Peridex mouth rinses, gave him NS and mouth care instructions; I entered a nutrtion consult.Eating soft foods.[employer] pending. 2956: started 1st dose (due to stopping vori d/t adverse effects Dose Frequency Start End anidulafungin (ERAXIS) 100 mg in sodium chloride 0.9% in 130 mL IVPB 100 mg ONCE 09/13/2022 -- Admin Instructions: Infuse Over100 mg:   90 minutes- completed without reaction tolerated platelets- no reaction, AND 1 UNIT prbc- NO REACTIONAnc 0.04 ON ACV,  LEVAQUIN, Anidulfungin (eraxis) IV every 2-3 daysComplete Blood CountResults in Past 7 DaysResult Component Current Result Hematocrit 22.30 (L) (09/13/2022) Hemoglobin 7.7 (L) (09/13/2022) MCH 28.4 (09/13/2022) MCHC 34.5 (09/13/2022) MCV 82.3 (09/13/2022) Platelets 18 (L) (09/13/2022) RBC 2.71 (L) (09/13/2022) WBC 0.3 (L) (09/13/2022)    Chemistry    Component Value Date/Time  NA 136 09/13/2022 0916  K 3.5 09/13/2022 0916  CL 104 09/13/2022 0916  CO2 20 09/13/2022 0916  BUN 11 09/13/2022 0916  CREATININE 0.82 09/13/2022 0916  GLU 117 (H) 09/13/2022 0916  PROT 7.0 09/13/2022 0916    Component Value Date/Time  CALCIUM 8.5 (L) 09/13/2022 0916  ALKPHOS 97 09/13/2022 0916  AST 21 09/13/2022 0916  ALT 20 09/13/2022 0916  BILITOT 0.5 09/13/2022 0916  ALBUMIN 3.5 (L) 09/13/2022 0916   RTC Wednesday 7/31 for Labs/supportive care/ aniDULFUNGIN (eRAXIS) ivKaren Syanne Looney, RNJuly 2024  Sunday Monday Tuesday Wednesday Thursday Friday Saturday   1 2SPIROMETRY,LUNG VOL, DLCO ND11:30 AM (40 min.) PFT PROCEDURE ROOM 2 [facility]UL[facility] [place] W [id number] PM (50 min.) [facility] ECHO 1 [facility] EchocardiographyY[facility] [place] WITH PREP 2:00 PM (80 min.) [facility] [place] PRE EXAM RM (NP) [facility] [place] Scan 3TREATMENT 4 HOURS 7:45 AM (240 min.) [facility] [place] HEMA INFUSION MASTER CHAIR Hematology Oncology Infusion ProgramRETURN ND 8:30 AM (30 min.) Laurian Brim Rosezella [place]., APRN [facility] at [facility] 4 5 6TREATMENT 4 HOURS10:00 AM (240 min.) [facility] [place] HEMA INFUSION MASTER CHAIR Hematology Oncology Infusion Program 7 8Y[facility] MRI LUMBAR SPINE W WO IV CONTRAST 1:10 PM (40 min.) [facility] MR MRC 1 1.5T [facility] MRI 9 10TREATMENT 4 HOURS12:30 PM (240 min.) [facility] [place] HEMA INFUSION MASTER CHAIR Hematology Oncology Infusion Program 11 12 13TREATMENT 4 HOURS10:00 AM (240 min.) [facility] [place] HEMA INFUSION MASTER CHAIR Hematology Oncology Infusion Program 14 15 16  17TREATMENT 4 HOURS 1:15 PM (240 min.) [facility] [place] HEMA INFUSION MASTER CHAIR Hematology Oncology Infusion Program 18 19TREATMENT 4 HOURS12:30 PM (240 min.) [facility] [place] BMT DAY HOSP - CHAIR 1 [facility] 20 21TREATMENT 4 HOURS10:00 AM (240 min.) [facility] [place] HEMA INFUSION MASTER CHAIR Hematology Oncology Infusion Program 22[facility] [place] GUIDED NEEDLE BX ASP BONE MARROW 8:40 AM (180 min.) [facility] CT2 NP 2508 [facility] [place] ScanOutpatient Visit11:18 AM [facility] Specimens 23 24RETURN ND 1:00 PM (45 min.) Chrystine Oiler, MD [facility] HOURS 1:15 PM (240 min.) [facility] [place] HEMA INFUSION MASTER CHAIR Hematology Oncology Infusion Program 25  26TREATMENT 4 HOURS12:15 PM (240 min.) [facility] [place] HEMA INFUSION MASTER CHAIR Hematology Oncology Infusion ProgramRETURN ND 3:00 PM (30 min.) Vernelle Emerald, MD [facility] Hematology Program at [facility] 27 28TREATMENT 3 HOURS 9:00 AM (180 min.) [facility] [place] HEMA INFUSION MASTER CHAIR Hematology Oncology Infusion Program 29 30 31TREATMENT 4 HOURS10:15 AM (240 min.) [facility] [place] HEMA MASTER ROOM Hematology Oncology Infusion Program      August 2024  Sunday Monday Tuesday Wednesday Thursday Friday Saturday      1 2 3TREATMENT 4 HOURS10:00 AM (240 min.) [facility] [place] HEMA INFUSION MASTER CHAIR Hematology Oncology Infusion Program 4 5 6  7TREATMENT 4 HOURS10:15 AM (240 min.) [facility] [place] HEMA MASTER ROOM Hematology Oncology Infusion Program 8 9 10TREATMENT 4 HOURS10:00 AM (240 min.) [facility] [place] HEMA INFUSION MASTER CHAIR Hematology Oncology Infusion Program 11 12 13  14TREATMENT 4 HOURS10:15 AM (240 min.) [facility] [place] HEMA MASTER ROOM Hematology Oncology Infusion Program 15 16 17TREATMENT 4 HOURS10:00 AM (240 min.) [facility] [place] HEMA INFUSION MASTER CHAIR Hematology Oncology Infusion Program 18 19 20  21TREATMENT 4 HOURS10:15 AM (240 min.) [facility] [place] HEMA MASTER ROOM Hematology Oncology Infusion Program 22 23 24TREATMENT 4 HOURS10:00 AM (240 min.) [facility] [place] HEMA INFUSION MASTER CHAIR Hematology Oncology Infusion Program 25 26 27  28TREATMENT 4 HOURS10:15 AM (240 min.) [facility] [place] HEMA MASTER ROOM Hematology Oncology Infusion Program 29 30 31TREATMENT 4 HOURS10:00 AM (240 min.) [facility] INFUSION MASTER CHAIR Hematology Oncology Infusion Program   September 2024  Sunday Monday Tuesday Wednesday Thursday Friday Saturday  1 2 3 4 5 6 7 8 9 10 11 12 13 14 15 16 17 18 19 20 21 22 23 24 25 26 27 28 29  30

## 2022-09-14 ENCOUNTER — Encounter: Admit: 2022-09-14 | Payer: PRIVATE HEALTH INSURANCE

## 2022-09-15 ENCOUNTER — Telehealth: Admit: 2022-09-15 | Payer: PRIVATE HEALTH INSURANCE | Attending: Medical Oncology

## 2022-09-15 NOTE — Other
PHARMACIST:  BMT NOTEPatient Diagnosis: CMMLAttending: Lenard Forth MDProtocol/Regimen Name: Fludarabine/Melphalan with post-transplant CyclophosphamideDay 0: 8/8/24Citation: 1. Brammer J KI, et al. Safety and Efficacy of Nonmyeloablative Melphalan-Based Conditioning for Haploidentical Allogeneic Stem Cell Transplantation in Patients with Advanced Lymphoma. BMT Tandem Meetings. Supplement 2015, 21, [id number].Brammer et al.  Haploidentical Stem Cell transplantation for lymphoma with Melphalan Based conditining.  Biol Blood Marrow Transplant 2016:22;493-498 2. Nira Conn, Ciurea SO. An overview of conditioning regimens for haploidentical stem cell transplantation with post-transplantation cyclophosphamide [published correction appears in Am J Hematol. 2020 Jun;95(6):730]. Am J Hematol. 2015;90(6):541-548.  Medications/Dose, Frequency:              *Melphalan 70 mg/m2 over 30 minutes on D-6              Fludarabine 40 mg/m2 over 30 minutes every 24hr for 4 doses from D-5 to D -2              ^High dose cyclophosphamide 25 mg/kg IV over 1.5 hrs every 24hr for 2 doses on D +3 & D +4              ^[place] 25 mg/kg CIVI daily, for 2 doses, starting 15 minutes before cyclophosphamide on D +3 & D +4 *Melphalan dosed reduced to 70 mg/m2 due to age^PTCy dose reduced by 50% to 25 mg/kg due 10/10 match and ageConsent: Seropian (09/09/22)Patient DescriptionWeight used in orders: 105.4 kgHeight used in orders: 180.3 cmBSA used in orders: 2.3 m2BMI: 32.07 kg/m2Comments regarding dose calculations: Actual body weightHistoryONC/treatment history: 12/2019: 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[facility]/20[facility] - 05/2022: Observation4/2024: BMBx: CMML 10-15%, concern for evolving to AML4/2024 - 07/2022: Decitabine + Venetoclax x 2 cycles	Cycle 2 - Venetoclax decreased to 14 days5/2024: BMBx: Hypercellular (60-70%), Myeloid neoplasm, 5% blasts7/2024: BMBx: Hypocellular (<5%), no increase in blasts 09/2022: 10/10 MUD Allogeneic PBSCT conditioned with Mel/Fludarabine/PTCy; Day 0 - 8/8/2024Supportive Care:Emesis prophylaxis: Ondansetron 16 mg PO every 12 hours w/ PRN Compazine, Dexamethasone 12mg  po premed prior to melphalanGrowth factors: Filgrastim 480 mcg subcutaneous starting D +5Additional Supportive Care:  Ursodiol 500 mg twice dailyHigh dose cyclophosphamide: doxepin premed 25 mg and diphenhydramine 25 mg 30 mins before Cytoxan; D5W 1/2NS + KCL 20 meq + Magnesium 1 gm alternating with D5W + of NaCHO3 + KCL 20 meq until 24 hours after last Cyclophosphamide dose (hold fluid during Cytoxan infusion)Antimicrobial Supportive Care: Acyclovir 400 mg PO TID (home medications)Ciprofloxacin 500 mg po twice dailyIsavuconazole 372 mg po daily (prior authorization pending, voriconazole allergy)Pentamidine 300 mg inhalation monthly Cells: Fresh cells, no LRImmunosuppression: 10/10 MUDTacrolimus 3 mg PO twice daily starting D +5No MMF due to 10/10 matchID: CMV: R-/D Toxoplasmosis: R-/D, Varicella:  R+/DPharmacogenomic PanelTACROLIMUS - CYP3A5 intermediate metabolizer (*1/*3)CYP3A4 normal metabolizer (*1/*1) - Tacrolimus dosing will be based CPIC guideline 0.02 mg/kg/day IV converted to PO  VORICONAZOLEis a intermediate metabolizers for CYP 2C19 (*1/*2) - which could cause increase clearance of the drug and have subtherapeutic drug levels.  If voriconazole is needed, starting dose should be  mg po twice daily with a trough level in 5 days. PAIN CONTROL is a metabolizer for CYP2D6 (*1/*10) Activity Score: 1.25-2.25OXYCODONE/TRAMADOL: Normal doses can be used NAUSEA/VOMITINGOndansetron is metabolized by CYP2D - Normal doses can be usedMonitoring ParametersLabs:  ZOX:WRUEAV Results (from the past 8736 hour(s)) CBC auto differential  Collection Time: 09/13/22  9:16 AM Result Value Ref Range  WBC 0.3 (L) 4.0 - 11.0 x1000/?L  RBC 2.71 (L) 4.00 - 6.00 M/?L  Hemoglobin 7.7 (L) 13.2 - 17.1 g/dL  Hematocrit  22.30 (L) 38.50 - 50.00 %  MCV 82.3 80.0 - 100.0 fL  MCH 28.4 27.0 - 33.0 pg  MCHC 34.5 31.0 - 36.0 g/dL  RDW-CV 62.1 30.8 - 65.7 %  Platelets 18 (L) 150 - 420 x1000/?L  MPV    Neutrophils    Lymphocytes    Monocytes    Eosinophils    Basophil    Immature Granulocytes    nRBC 0.0 0.0 - 1.0 %  Absolute Lymphocyte Count    Monocyte Absolute Count    Eosinophil Absolute Count    Basophil Absolute Count    Absolute Immature Granulocyte Count    Absolute nRBC 0.00 0.00 - 1.00 x 1000/?L  ANC (Abs Neutrophil Count) 0.04 (L) 2.00 - 7.60 x 1000/?L QIO:NGEXBM Results (from the past 8736 hour(s)) Comprehensive metabolic panel  Collection Time: 09/13/22  9:16 AM Result Value Ref Range  Sodium 136 136 - 144 mmol/L  Potassium 3.5 3.3 - 5.3 mmol/L  Chloride 104 98 - 107 mmol/L  CO2 20 20 - 30 mmol/L  Anion Gap 12 7 - 17  Glucose 117 (H) 70 - 100 mg/dL  BUN 11 8 - 23 mg/dL  Creatinine 8.41 3.24 - 1.30 mg/dL  Calcium 8.5 (L) 8.8 - 10.2 mg/dL  BUN/Creatinine Ratio 40.1 8.0 - 23.0  Total Protein 7.0 5.9 - 8.3 g/dL  Albumin 3.5 (L) 3.6 - 5.1 g/dL  Total Bilirubin 0.5 <=0.2 mg/dL  Alkaline Phosphatase 97 9 - 122 U/L  Alanine Aminotransferase (ALT) 20 9 - 59 U/L  Aspartate Aminotransferase (AST) 21 10 - 35 U/L  Globulin 3.5 2.0 - 3.9 g/dL  A/G Ratio 1.0 1.0 - 2.2  AST/ALT Ratio 1.1 Reference Range Not Established  eGFR (Creatinine) >60 >=60 mL/min/1.70m2 LDH: Recent Results (from the past 8736 hour(s)) Lactate dehydrogenase  Collection Time: 09/13/22  9:16 AM Result Value Ref Range  LD 202 122 - 241 U/L Estimated CrCl: 90 mL/minEF (date): 64 % (08/18/22)Anthracycline cumulative dose:  mg/m2 Doxorubicin equivalentsPFTs (date): DLCO 87% DLCO Hb 118 %.Normal spirometry. No restrictive defect present.  Normal RV/TLC ratio; no air trapping present.  Diffusing capacity (corrected) is within normal limits.  (08/18/22)Comments: If develops neutropenic fever will start Zosyn/Vancomycin.  No previous history of allergies to antibiotics or resistant organisms.CMV -/pending, will try to get letermovir for CMV prophylaxis.  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  Will start when admitted and work on the appealMedication reconciliation completedWill hold atorvastatin during transplantElectronically Signed by Jorene Guest, PharmD, September 15, 2022

## 2022-09-15 NOTE — Telephone Encounter
[name] has cleared Mr. Slagter for his allogeneic stem cell transplant admission on Friday August 2nd.  I spoke with Mr. And Mrs. Valadez to confirm he is all set for admission and did not have any questions. He will have his preadmission COVID Swab done in clinic tomorrow July 31st. Some questions regarding his admission was reviewed. They are aware they should call [facility]11 directly on Friday August 2nd to confirm what time to come in for their admission. Mr. Devilliers reviewed that he has been doing the mouth rinse as directed by Dr. Sherrell Puller and asked if he should continue. Per Mr. Klaver the mouth rinse is helping, so I recommended he continued as instructed. They have my phone number should they have any questions prior to admission [phone]. Please call the COVID hotline at [phone] to schedule a COVID swab to be completed on 7/30, 7/31 or 8/1. This test needs to be performed at a [facility] site convenient to you.   If there is not a center open or convenient to you, please make an apt in the [facility] clinic for the covid swab. This is very important and you cannot be admitted without this screening covid test results   09/18/22  ADMISSION  8:00am-please call NP11 at [phone] and tell them you are being admitted for an allogeneic stem cell transplant and would like to speak to the charge nurse.    If your bed is ready you will go straight into your room.  If your bed isn't ready yet the [facility]11 staff will direct you to the [facility] (extended care clinic) or alternate waiting area where you will wait and be checked in until your bed is ready .  August 2D-6Melphalan IV once  August 3D-5Fludarabine over 30 minutes every 24hr August 4D-4Fludarabine over 30 minutes every 24hrAugust 5D-3Fludarabine over 30 minutes every 24hrAugust 6D-2Fludarabine over 30 minutes every 24hrAugust 7 D-1RESTAugust 8 D0InfuseCyclophosphamide every 24hr for 2 doses on D +3 & D +4

## 2022-09-16 ENCOUNTER — Ambulatory Visit: Admit: 2022-09-16 | Payer: PRIVATE HEALTH INSURANCE

## 2022-09-16 ENCOUNTER — Inpatient Hospital Stay: Admit: 2022-09-16 | Discharge: 2022-09-16 | Payer: MEDICARE

## 2022-09-16 DIAGNOSIS — C9200 Acute myeloblastic leukemia, not having achieved remission: Secondary | ICD-10-CM

## 2022-09-16 DIAGNOSIS — C931 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: Secondary | ICD-10-CM

## 2022-09-16 DIAGNOSIS — Z79624 Long term (current) use of inhibitors of nucleotide synthesis: Secondary | ICD-10-CM

## 2022-09-16 DIAGNOSIS — Z79899 Other long term (current) drug therapy: Secondary | ICD-10-CM

## 2022-09-16 LAB — CBC WITH AUTO DIFFERENTIAL
BKR WAM ABSOLUTE LYMPHOCYTE COUNT.: >60 mL/min/{1.73_m2} (ref >=60)
BKR WAM ABSOLUTE NRBC (2 DEC): 0 x 1000/ÂµL (ref 0.00–1.00)
BKR WAM ANC (ABSOLUTE NEUTROPHIL COUNT): 0.05 x 1000/ÂµL — ABNORMAL LOW (ref 2.00–7.60)
BKR WAM BASOPHILS: 4 g/dL — ABNORMAL HIGH (ref 2.0–3.9)
BKR WAM EOSINOPHILS: 20 U/L (ref 10–35)
BKR WAM HEMATOCRIT (2 DEC): 26.1 % — ABNORMAL LOW (ref 38.50–50.00)
BKR WAM HEMOGLOBIN: 8.8 g/dL — ABNORMAL LOW (ref 13.2–17.1)
BKR WAM IMMATURE GRANULOCYTES: 1 (ref 1.0–2.2)
BKR WAM LYMPHOCYTES: 103 U/L — ABNORMAL HIGH (ref 9–122)
BKR WAM MCH (PG): 27.8 pg (ref 27.0–33.0)
BKR WAM MCHC: 33.7 g/dL (ref 31.0–36.0)
BKR WAM MCV: 82.3 fL (ref 80.0–100.0)
BKR WAM NUCLEATED RED BLOOD CELLS: 0 % (ref 0.0–1.0)
BKR WAM PLATELETS: 9 x1000/ÂµL — CL (ref 150–420)
BKR WAM RDW-CV: 14.3 % (ref 11.0–15.0)
BKR WAM RED BLOOD CELL COUNT.: 3.17 M/ÂµL — ABNORMAL LOW (ref 4.00–6.00)
BKR WAM WHITE BLOOD CELL COUNT: 0.4 x1000/ÂµL — ABNORMAL LOW (ref 4.0–11.0)

## 2022-09-16 LAB — COMPREHENSIVE METABOLIC PANEL
BKR A/G RATIO: 1 (ref 1.0–2.2)
BKR ALANINE AMINOTRANSFERASE (ALT): 22 U/L (ref 9–59)
BKR ALBUMIN: 3.8 g/dL (ref 3.6–5.1)
BKR ALKALINE PHOSPHATASE: 103 U/L (ref 9–122)
BKR ANION GAP: 14 (ref 7–17)
BKR ASPARTATE AMINOTRANSFERASE (AST): 20 U/L (ref 10–35)
BKR AST/ALT RATIO: 0.9
BKR BILIRUBIN TOTAL: 0.8 mg/dL (ref <=1.2)
BKR BLOOD UREA NITROGEN: 14 mg/dL (ref 8–23)
BKR BUN / CREAT RATIO: 14 (ref 8.0–23.0)
BKR CALCIUM: 9.4 mg/dL (ref 8.8–10.2)
BKR CHLORIDE: 103 mmol/L (ref 98–107)
BKR CO2: 20 mmol/L (ref 20–30)
BKR CREATININE: 1 mg/dL (ref 0.40–1.30)
BKR EGFR, CREATININE (CKD-EPI 2021): >60 mL/min/{1.73_m2} (ref >=60)
BKR GLOBULIN: 4 g/dL — ABNORMAL HIGH (ref 2.0–3.9)
BKR GLUCOSE: 130 mg/dL — ABNORMAL HIGH (ref 70–100)
BKR POTASSIUM: 4.1 mmol/L (ref 3.3–5.3)
BKR PROTEIN TOTAL: 7.8 g/dL (ref 5.9–8.3)
BKR SODIUM: 137 mmol/L (ref 136–144)

## 2022-09-16 LAB — COVID-19 CLEARANCE OR FOR PLACEMENT ONLY: BKR SARS-COV-2 RNA (COVID-19) (YH): NEGATIVE

## 2022-09-16 LAB — IMMATURE PLATELET FRACTION (BH GH LMW YH)
BKR WAM IPF, ABSOLUTE: 0.1 x1000/ÂµL (ref <20.0)
BKR WAM IPF: 1.5 % (ref 1.2–8.6)

## 2022-09-16 LAB — LACTATE DEHYDROGENASE: BKR LACTATE DEHYDROGENASE: 210 U/L (ref 122–241)

## 2022-09-16 MED ORDER — ANIDULAFUNGIN 100 MG IVPB
Freq: Once | INTRAVENOUS | Status: CP
Start: 2022-09-16 — End: ?
  Administered 2022-09-16: 15:00:00 130.000 mL/h via INTRAVENOUS

## 2022-09-16 NOTE — Progress Notes
Pt arrives to clinic with supportive spouse for labs/poss, anidulafungin and preadmission covid swab.Denies fevers, stable VS.Continues with with gingival edema and discomfort, using oral rinses and topical lidocaine with some relief.No new complaints.RDL Hickman C/D/I, labs obtained and reviewed-Hgb: 8.8; Plts: 9; ANC: 0.05Preadmission covid swab done.Anidulafungin administered per order.Transfused plts x1, well tolerated without premedications.  Discharged ambulatory in stable condition, plan for direct admit 8/2 for SCT, good verbal understanding of plan.

## 2022-09-17 ENCOUNTER — Other Ambulatory Visit: Admit: 2022-09-17 | Payer: MEDICARE

## 2022-09-17 DIAGNOSIS — D469 Myelodysplastic syndrome, unspecified: Secondary | ICD-10-CM

## 2022-09-17 NOTE — Progress Notes
Oncology Nutrition AssessmentLocation of visit: [facility]Type of visit: Initial assessmentReason for consult: low PO intakeReferred by: Donald Siva, APRN / Servando Salina, RNDiagnosis: AMLPast Medical History: reviewed available dataNutritionally relevant medications/supplements: zofran, magnesium, vitamin D, CoQ10, decadronNutritionally relevant labs: reviewed   BMI: Estimated body mass index is 32.04 kg/m? as calculated from the following:  Height as of 09/09/22: 5' 11 (1.803 m).  Weight as of an earlier encounter on 09/16/22: 104.2 kg.   IBW: 86 kg ([place] + 10%)Usual body weight: 250 lbs/113.6 kgWeight changes and assessment: Wt: 09/16/22 104.2 kg 09/13/22 104.3 kg 09/11/22 105.9 kg 09/09/22 105.4 kg 09/04/22 106.4 kg 09/02/22 106.5 kg 08/15/22	109.9 kgWeight loss of 5.2% in one month, significant for timeframe.Nutrition Focused Physical Findings: Mild depletion on visual assessment (orbital, temple)Nutritional Needs: 2580 calories (30kcal/kg), 103-129 gm pro (1.2-1.5g protein/kg) based on AML with treatment, IBW   Diet history or considerationsFood Allergies/intolerances or aversions: NKFA. Cultural/lifestyle/family needs: none notedSupport system: Wife present during visitActivity level and/or physical limitations: not discussedNutrition assessment:Workqueue consult received. Chart reviewed for initial assessment. 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 Patient and wife [place] out of state and are currently staying with family nearby. Met with patient during infusion. He has been having difficulty biting into food due to gum pain. Additionally, he reports altered taste. His wife has been preparing soft foods such as cream of wheat or mashed potatoes with gravy. He is able to eat other foods if they are cut up very small and he eats slowly. He states that PO intake is less than usual; suspect patient is meeting less than 75% of estimated nutrition needs.  Nutrition Diagnosis:Malnutrition  related to altered taste and difficulty eating as evidenced by PO intake less than 75% for more than one month and significant weight loss of more than 5% in one month.Nutrition Intervention: Specific foods/beverages or groups , Commercial beverage  , and Purpose of the nutrition education  Encouraged small, frequent meals and snacks including soft foods or minced foodsDiscussed nutrition shakes; provided samples of Ensure Plus (chocolate), Orgain powder (vanilla)Provided written nutrition educationGoals by next nutrition visit:Consume 5-6 small meals and snacks dailyConsume one nutrition shake daily (Ensure Plus or similar product)Education Materials provided: Practical Ideas for Increasing Calories and Protein (soft foods)High Calorie, High Protein Shake RecipesNutrition monitoring and evaluation: Oral fluids , Liquid meal replacement or supplement , Amount of food, Weight , and Digestive system  Coordination with providers: consult order received from APRNLearning assessment barriers: none Comprehension level: good based on discussion and questionsExpected compliance/motivation: good Follow up: plan to follow up with patient when discharged after transplantLength of time spent with pt: 20 minutesSeen VH:QIONGE Sylva Overley, RD, CDN, MSElectronically Signed by Magda Paganini, RD, September 16, 2022

## 2022-09-18 ENCOUNTER — Encounter: Admit: 2022-09-18 | Payer: PRIVATE HEALTH INSURANCE

## 2022-09-18 ENCOUNTER — Inpatient Hospital Stay: Admit: 2022-09-18 | Discharge: 2022-10-10 | Payer: MEDICARE | Attending: Hematology | Admitting: Medical Oncology

## 2022-09-18 LAB — COMPREHENSIVE METABOLIC PANEL
BKR A/G RATIO: 1 (ref 1.0–2.2)
BKR ALANINE AMINOTRANSFERASE (ALT): 22 U/L (ref 9–59)
BKR ALBUMIN: 3.7 g/dL (ref 3.6–5.1)
BKR ALKALINE PHOSPHATASE: 99 U/L (ref 9–122)
BKR ANION GAP: 13 (ref 7–17)
BKR ASPARTATE AMINOTRANSFERASE (AST): 16 U/L (ref 10–35)
BKR AST/ALT RATIO: 0.7
BKR BILIRUBIN TOTAL: 0.7 mg/dL (ref <=1.2)
BKR BLOOD UREA NITROGEN: 14 mg/dL (ref 8–23)
BKR BUN / CREAT RATIO: 16.3 (ref 8.0–23.0)
BKR CALCIUM: 9 mg/dL (ref 8.8–10.2)
BKR CHLORIDE: 99 mmol/L (ref 98–107)
BKR CO2: 21 mmol/L (ref 20–30)
BKR CREATININE: 0.86 mg/dL (ref 0.40–1.30)
BKR EGFR, CREATININE (CKD-EPI 2021): >60 mL/min/{1.73_m2} (ref >=60)
BKR GLOBULIN: 3.8 g/dL (ref 2.0–3.9)
BKR GLUCOSE: 196 mg/dL — ABNORMAL HIGH (ref 70–100)
BKR POTASSIUM: 3.6 mmol/L — ABNORMAL LOW (ref 3.3–5.3)
BKR PROTEIN TOTAL: 7.5 g/dL (ref 5.9–8.3)
BKR SODIUM: 133 mmol/L — ABNORMAL LOW (ref 136–144)

## 2022-09-18 LAB — TRIGLYCERIDES: BKR TRIGLYCERIDES: 68 mg/dL

## 2022-09-18 LAB — CBC WITH AUTO DIFFERENTIAL
BKR WAM ABSOLUTE LYMPHOCYTE COUNT.: 16 U/L (ref 10–35)
BKR WAM ABSOLUTE NRBC (2 DEC): 0 x 1000/ÂµL (ref 0.00–1.00)
BKR WAM ANC (ABSOLUTE NEUTROPHIL COUNT): 0.07 x 1000/ÂµL — ABNORMAL LOW (ref 2.00–7.60)
BKR WAM BASOPHILS: 0.7 mg/dL (ref <=1.2)
BKR WAM EOSINOPHIL ABSOLUTE COUNT.: 1 (ref 1.0–2.2)
BKR WAM EOSINOPHILS: 3.7 g/dL (ref 3.6–5.1)
BKR WAM HEMATOCRIT (2 DEC): 23.3 % — ABNORMAL LOW (ref 38.50–50.00)
BKR WAM HEMOGLOBIN: 8 g/dL — ABNORMAL LOW (ref 13.2–17.1)
BKR WAM IMMATURE GRANULOCYTES: 99 U/L (ref 9–122)
BKR WAM MCH (PG): 28.3 pg (ref 27.0–33.0)
BKR WAM MCHC: 34.3 g/dL (ref 31.0–36.0)
BKR WAM MCV: 82.3 fL (ref 80.0–100.0)
BKR WAM MONOCYTE ABSOLUTE COUNT.: 3.8 g/dL (ref 2.0–3.9)
BKR WAM MONOCYTES: 7.5 g/dL (ref 5.9–8.3)
BKR WAM NUCLEATED RED BLOOD CELLS: 0 % (ref 0.0–1.0)
BKR WAM PLATELETS: 8 x1000/ÂµL — CL (ref 150–420)
BKR WAM RDW-CV: 14.2 % (ref 11.0–15.0)
BKR WAM RED BLOOD CELL COUNT.: 2.83 M/ÂµL — ABNORMAL LOW (ref 4.00–6.00)
BKR WAM WHITE BLOOD CELL COUNT: 0.3 x1000/ÂµL — ABNORMAL LOW (ref 4.0–11.0)

## 2022-09-18 LAB — MAGNESIUM: BKR MAGNESIUM: 2 mg/dL (ref 1.7–2.4)

## 2022-09-18 LAB — IMMATURE PLATELET FRACTION (BH GH LMW YH)
BKR WAM IPF, ABSOLUTE: 0.2 x1000/ÂµL (ref <20.0)
BKR WAM IPF: 1.9 % (ref 1.2–8.6)

## 2022-09-18 LAB — PT/INR AND PTT (BH GH L LMW YH)
BKR INR: 1.15 — ABNORMAL HIGH (ref 0.86–1.12)
BKR PARTIAL THROMBOPLASTIN TIME: 33.7 seconds — ABNORMAL HIGH (ref 23.0–31.4)
BKR PROTHROMBIN TIME: 12.6 seconds — ABNORMAL HIGH (ref 9.6–12.3)

## 2022-09-18 LAB — NT-PROBNPE: BKR B-TYPE NATRIURETIC PEPTIDE, PRO (PROBNP): 175 pg/mL — ABNORMAL HIGH (ref <125.0)

## 2022-09-18 LAB — LACTATE DEHYDROGENASE: BKR LACTATE DEHYDROGENASE: 236 U/L (ref 122–241)

## 2022-09-18 LAB — HAPTOGLOBIN: BKR HAPTOGLOBIN: 479 mg/dL — ABNORMAL HIGH (ref 30–200)

## 2022-09-18 MED ORDER — CHLORHEXIDINE GLUCONATE 0.12 % MOUTHWASH
0.12 | Freq: Two times a day (BID) | OROMUCOSAL | Status: CP
Start: 2022-09-18 — End: ?
  Administered 2022-09-19 – 2022-10-10 (×38): 0.12 mL via OROMUCOSAL

## 2022-09-18 MED ORDER — PROCHLORPERAZINE EDISYLATE 10 MG/2 ML (5 MG/ML) INJECTION SOLUTION
10 | Freq: Four times a day (QID) | INTRAVENOUS | Status: CP | PRN
Start: 2022-09-18 — End: ?
  Administered 2022-09-23 – 2022-10-06 (×5): 10 mL via INTRAVENOUS

## 2022-09-18 MED ORDER — FLUDARABINE INFUSION
INTRAVENOUS | Status: CP
Start: 2022-09-18 — End: ?
  Administered 2022-09-19 – 2022-09-22 (×4): 100.000 mL/h via INTRAVENOUS

## 2022-09-18 MED ORDER — EPINEPHRINE 0.3 MG/0.3 ML INJECTION, AUTO-INJECTOR
0.3 | INTRAMUSCULAR | Status: AC | PRN
Start: 2022-09-18 — End: ?

## 2022-09-18 MED ORDER — ISAVUCONAZONIUM SULFATE 186 MG CAPSULE
186 | ORAL | Status: CP
Start: 2022-09-18 — End: ?
  Administered 2022-09-18 – 2022-10-10 (×23): 186 mg via ORAL

## 2022-09-18 MED ORDER — LORAZEPAM 0.5 MG TABLET
0.5 | Freq: Four times a day (QID) | ORAL | Status: AC | PRN
Start: 2022-09-18 — End: ?

## 2022-09-18 MED ORDER — ACYCLOVIR 200 MG CAPSULE
200 | Freq: Three times a day (TID) | ORAL | Status: CP
Start: 2022-09-18 — End: ?
  Administered 2022-09-19 – 2022-10-07 (×56): 200 mg via ORAL

## 2022-09-18 MED ORDER — CIPROFLOXACIN 500 MG TABLET
500 | Freq: Two times a day (BID) | ORAL | Status: CP
Start: 2022-09-18 — End: ?
  Administered 2022-09-19 – 2022-09-27 (×17): 500 mg via ORAL

## 2022-09-18 MED ORDER — SODIUM CHLORIDE 0.9 % (FLUSH) INJECTION SYRINGE
0.9 | INTRAVENOUS | Status: AC | PRN
Start: 2022-09-18 — End: ?
  Administered 2022-09-27 – 2022-09-30 (×3): 0.9 mL via INTRAVENOUS

## 2022-09-18 MED ORDER — DEXAMETHASONE 6 MG TABLET
6 | Freq: Once | ORAL | Status: CP
Start: 2022-09-18 — End: ?
  Administered 2022-09-18: 20:00:00 6 mg via ORAL

## 2022-09-18 MED ORDER — FAMOTIDINE 4 MG/ML IN 0.9% SODIUM CHLORIDE (ADULT)
Freq: Once | INTRAVENOUS | Status: AC | PRN
Start: 2022-09-18 — End: ?

## 2022-09-18 MED ORDER — MESNA INFUSION
INTRAVENOUS | Status: AC
Start: 2022-09-18 — End: ?
  Administered 2022-09-27 – 2022-09-28 (×2): 500.000 mL/h via INTRAVENOUS

## 2022-09-18 MED ORDER — ALBUTEROL SULFATE HFA 90 MCG/ACTUATION AEROSOL INHALER
90 | Freq: Once | RESPIRATORY_TRACT | Status: CP
Start: 2022-09-18 — End: ?
  Administered 2022-09-19: 16:00:00 90 mcg/actuation via RESPIRATORY_TRACT

## 2022-09-18 MED ORDER — ONDANSETRON 8 MG DISINTEGRATING TABLET
8 | Freq: Two times a day (BID) | Status: CP
Start: 2022-09-18 — End: ?
  Administered 2022-09-18 – 2022-09-28 (×17): 8 mg

## 2022-09-18 MED ORDER — SODIUM CHLORIDE 0.9 % (FLUSH) INJECTION SYRINGE
0.9 | INTRAVENOUS | Status: AC | PRN
Start: 2022-09-18 — End: ?

## 2022-09-18 MED ORDER — TACROLIMUS IMMEDIATE RELEASE 1 MG CAPSULE
1 | Freq: Two times a day (BID) | ORAL | Status: AC
Start: 2022-09-18 — End: ?
  Administered 2022-09-29 – 2022-09-30 (×3): 1 mg via ORAL

## 2022-09-18 MED ORDER — PENTAMIDINE FOR INHALATION SOLUTION
Freq: Once | RESPIRATORY_TRACT | Status: CP
Start: 2022-09-18 — End: ?
  Administered 2022-09-19: 16:00:00 6.000 mL via RESPIRATORY_TRACT

## 2022-09-18 MED ORDER — DIPHENHYDRAMINE 50 MG/ML INJECTION (WRAPPED E-RX)
50 | INTRAVENOUS | Status: AC
Start: 2022-09-18 — End: ?
  Administered 2022-09-27 – 2022-09-28 (×2): 50 mL via INTRAVENOUS

## 2022-09-18 MED ORDER — ACETAMINOPHEN 325 MG TABLET
325 mg | Freq: Once | ORAL | Status: CP
Start: 2022-09-18 — End: ?
  Administered 2022-09-25: 02:00:00 325 mg via ORAL

## 2022-09-18 MED ORDER — SODIUM CHLORIDE 0.9 % BOLUS (NEW BAG)
0.9 | Freq: Once | INTRAVENOUS | Status: AC | PRN
Start: 2022-09-18 — End: ?

## 2022-09-18 MED ORDER — D5 1/2NS + KCL 20MEQ + SODIUM BICARBONATE 50 MEQ
INTRAVENOUS | Status: CP
Start: 2022-09-18 — End: ?
  Administered 2022-09-27 – 2022-09-29 (×6): 1000.000 mL/h via INTRAVENOUS

## 2022-09-18 MED ORDER — HYDROCORTISONE SOD SUCCINATE (PF) 100 MG/2 ML SOLUTION FOR INJECTION
100 | Freq: Once | INTRAVENOUS | Status: AC | PRN
Start: 2022-09-18 — End: ?

## 2022-09-18 MED ORDER — DOXEPIN 25 MG CAPSULE
25 | ORAL | Status: AC
Start: 2022-09-18 — End: ?
  Administered 2022-09-27 – 2022-09-28 (×2): 25 mg via ORAL

## 2022-09-18 MED ORDER — CYCLOPHOSPHAMIDE INFUSION
INTRAVENOUS | Status: AC
Start: 2022-09-18 — End: ?
  Administered 2022-09-27 – 2022-09-28 (×2): 500.000 mL/h via INTRAVENOUS

## 2022-09-18 MED ORDER — DIPHENHYDRAMINE 50 MG/ML INJECTION (WRAPPED E-RX)
50 | Freq: Once | INTRAVENOUS | Status: AC | PRN
Start: 2022-09-18 — End: ?

## 2022-09-18 MED ORDER — LATANOPROST 0.005 % EYE DROPS
0.005 | Freq: Every evening | OPHTHALMIC | Status: CP
Start: 2022-09-18 — End: ?
  Administered 2022-09-19 – 2022-10-10 (×17): 0.005 mL via OPHTHALMIC

## 2022-09-18 MED ORDER — URSODIOL 250 MG TABLET
250 | Freq: Two times a day (BID) | ORAL | Status: CP
Start: 2022-09-18 — End: ?
  Administered 2022-09-18 – 2022-10-10 (×44): 250 mg via ORAL

## 2022-09-18 MED ORDER — SODIUM CHLORIDE 0.9 % (FLUSH) INJECTION SYRINGE
0.9 | Freq: Three times a day (TID) | INTRAVENOUS | Status: AC
Start: 2022-09-18 — End: ?
  Administered 2022-09-22 – 2022-10-10 (×17): 0.9 mL via INTRAVENOUS

## 2022-09-18 MED ORDER — SODIUM CHLORIDE 0.9 % INTRAVENOUS SOLUTION
INTRAVENOUS | Status: AC | PRN
Start: 2022-09-18 — End: ?

## 2022-09-18 MED ORDER — D5 1/2 NS WITH KCL AND MAGNESIUM
INTRAVENOUS | Status: CP
Start: 2022-09-18 — End: ?
  Administered 2022-09-27 – 2022-09-29 (×5): 1000.000 mL/h via INTRAVENOUS

## 2022-09-18 MED ORDER — ALBUTEROL SULFATE 2.5 MG/3 ML (0.083 %) SOLUTION FOR NEBULIZATION
2.5 | RESPIRATORY_TRACT | Status: AC | PRN
Start: 2022-09-18 — End: ?

## 2022-09-18 MED ORDER — FILGRASTIM-SNDZ 480 MCG/0.8 ML INJECTION SYRINGE
480 | Freq: Every day | SUBCUTANEOUS | Status: AC
Start: 2022-09-18 — End: ?
  Administered 2022-09-29 – 2022-10-06 (×8): 480 mL via SUBCUTANEOUS

## 2022-09-18 MED ORDER — MELPHALAN INFUSION
Freq: Once | INTRAVENOUS | Status: CP
Start: 2022-09-18 — End: ?
  Administered 2022-09-18: 22:00:00 250.000 mL/h via INTRAVENOUS

## 2022-09-18 MED ORDER — FINASTERIDE 5 MG TABLET
5 | Freq: Every day | ORAL | Status: CP
Start: 2022-09-18 — End: ?
  Administered 2022-09-19 – 2022-10-10 (×22): 5 mg via ORAL

## 2022-09-18 MED ORDER — DIPHENHYDRAMINE 50 MG/ML INJECTION (WRAPPED E-RX)
50 mg/mL | Freq: Once | INTRAVENOUS | Status: CP
Start: 2022-09-18 — End: ?
  Administered 2022-09-25: 02:00:00 50 mL via INTRAVENOUS

## 2022-09-18 NOTE — Progress Notes
SOCIAL WORK NOTEPatient Name: Douglas Gonce BennettMedical Record Number: [id number] Date of Birth: 09-10-1952Medical Social Work Follow Up  [employer] Most Recent Value Admission Information  Document Type Progress Note Reason for Current Social Work Regulatory affairs officer of Information Patient, Audiological scientist Comment wife Okey Regal Record Reviewed Yes Level of Care Ambulatory What medium(s) of communication were used with patient/family/caregiver? Face-to-Face / In-Person, Telephone Psychosocial issues requiring intervention Suites Psychosocial interventions 15 minutes by phone with wife and in visit with Douglas [name] on np11. I explained that we now have the information on the Suites at [facility] via [employer] (ACS) . I explained that we can provide 5 days under ACS and they can apply for the [facility] discount for the other days. Douglas [name] said she would like the 5 days to be 8/29- 9/2 and the possible discounted days to be approximately 9/3- 9/30, I left the forms to complete for the discount with Douglas [name]. He was on the phone with his wife (who had called him after I called her) and they said they will ask for me next week when they have a more solid plan. Douglas [name] room was decorated with multiple pictures of his dog, Gwyneth Sprout and he indicated he is coping well with his admission. [facility] hematology Specific referrals to enhance community supports (include existing and new resources) above Handoff Required? No Next Steps/Plan (including hand-off): Social work intervention complete. Signature: Huntley Estelle, LCSW Contact Information: [phone]

## 2022-09-18 NOTE — H&P
 [name] History & PhysicalHistory provided by: the patient and EMR reviewHistory limited by: no limitationsPatient presents from: HomeAttending: Seropian MDProtocol/Regimen Name: Fludarabine/Melphalan with post-transplant CyclophosphamideDay 0: 09/24/22 : 10/10 MUD Allogeneic PBSCT Subjective: Chief Complaint: Admission for Allogeneic HSCTHPIMr. 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 Pt had been under observation for CMML since 2021. Around 03/2022 he was noted to have bicytopenia (Hb 5.9, WBC 1.4). Bmbx was performed 04/2022 which showed focal areas up to 20% raising concern for evolution of CMML into AML. [place] Guided bmbx 4/29 showed CMML w increased blasts 10-15% with focal areas of 20-25%. After that he was started on Dec-Ven 06/16/22 . Post-C1 marrow showed decrease in blasts to <5%, normal FISH MDS panel. C2 of DEC5 and venetoclax on 6/4. Post C2 bmbx 7/22: hypocellular marrow (<5% cellularity), <5 blast. Evaluated by Dr. Lenard [name] and HSCT was recommended. Registry search showed a 10/10 match. There was some concern for ginigival hyperplasia indicating disease progression, however this was felt to be due to fluconazole. [place] abdomen revealed L2 vertebral lesion, MRI was then performed and the lesion was felt to be hemangioma, w plan for f/u MRI in 3-6 months. Given hypoplastic marrow, decision was made to proceed with reduced intensity conditioning with melphalan and fludarabine. Today he endorses some dry cough for the past 2 weeks, which mostly occurs when lying down. No fever, no chills. No heartburn or hx of reflux. No sick contacts. He endorses some chronic post-nasal drip.  Oncology History 12/2019: 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[facility]11/2021 - 05/2022: Observation4/2024: BMBx: CMML 10-15%, concern for evolving to AML4/2024 - 07/2022: Decitabine + Venetoclax x 2 cycles              Cycle 2 - Venetoclax decreased to 14 days5/2024: BMBx: Hypercellular (60-70%), Myeloid neoplasm, 5% blasts7/2024: BMBx: Hypocellular (<5%), no increase in blasts 09/2022: 10/10 MUD Allogeneic PBSCT conditioned with Mel/Fludarabine/PTCy; Day 0 - 8/8/2024Medical History: PMH PSH Past Medical History: Diagnosis Date  Anxiety   Basal cell carcinoma   Benign prostatic hyperplasia without lower urinary tract symptoms 08/29/2015  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 (HC Code)   CLL (chronic lymphocytic leukemia) (HC Code)   Depression   DOE (dyspnea on exertion)   Enlarged prostate   Essential hypertension   Family history of ischemic heart disease   Glaucoma   Hypertensive heart disease without heart failure   Mixed hyperlipidemia   Past Surgical History: Procedure Laterality Date  ADENOIDECTOMY    BONE MARROW BIOPSY    PROSTATE BIOPSY    TONSILLECTOMY    VASCULAR SURGERY    Social History Family History Social History Socioeconomic History  Marital status: Married   Spouse name: Not on file  Number of children: Not on file  Years of education: Not on file  Highest education level: Not on file Occupational History  Not on file Tobacco Use  Smoking status: Never  Smokeless tobacco: Never Vaping Use  Vaping Use: Never used Substance and Sexual Activity  Alcohol use: Yes   Alcohol/week: 2.0 standard drinks of alcohol   Types: 2 Cans of beer per week   Comment: social drinker once a week  Drug use: Never  Sexual activity: Not on file Other Topics Concern  Not on file Social History Narrative  Not on file Social Determinants of Health Financial Resource Strain: Low Risk  (  08/05/2021)  Overall Financial Resource Strain (CARDIA)   Difficulty of Paying Living Expenses: Not very hard Food Insecurity: No Food Insecurity (08/12/2022)  Hunger Vital Sign   Worried About Running Out of Food in the Last Year: Never true   Ran Out of Food in the Last Year: Never true Transportation Needs: No Transportation Needs (08/12/2022)  PRAPARE - Designer, jewellery (Medical): No   Lack of Transportation (Non-Medical): No Physical Activity: Not on file Stress: Not on file Social Connections: Not on file Intimate Partner Violence: Not on file Housing Stability: Low Risk  (08/12/2022)  Housing Stability   Housing Stability: I have a steady place to live   Housing Stability: Not on file  Family History Problem Relation Age of Onset  Breast cancer Mother   Liver cancer Mother   Heart disease Father   Prostate cancer Father   Breast cancer Sister   Thyroid disease Sister   Hearing loss Sister   Diabetes Daughter   No Known Problems Daughter   No Known Problems Grandchild   Prior to Admission Medications Medications Prior to Admission Medication Sig Dispense Refill Last Dose  acyclovir (ZOVIRAX) 400 mg tablet Take 1 tablet (400 mg total) by mouth 2 (two) times daily. 60 tablet 5 09/18/2022  chlorhexidine gluconate (PERIDEX) 0.12 % solution Swish and spit 15 mLs 2 (two) times daily. 420 mL 0 Past Week  dexAMETHasone (DECADRON) 0.5 mg/5 mL solution Dexamethasone solution 0.5 mg/5 mL, 5 mL swish 5 minutes and spit, 2 times/day 240 mL 0 Past Week  finasteride (PROSCAR) 5 mg tablet TAKE 1 TABLET BY MOUTH DAILY FOR ENLARGED PROSTATE WITH URINATION PROBLEM TAKE WITH OR WITHOUT MEALS   09/17/2022  latanoprost (XALATAN) 0.005 % ophthalmic solution Place 1 drop into both eyes nightly.   09/17/2022  levoFLOXacin (LEVAQUIN) 500 mg tablet Take 1 tablet (500 mg total) by mouth daily.   09/18/2022  lisinopriL (PRINIVIL,ZESTRIL) 40 mg tablet take 1 tablet by mouth every day in the morning   Past Week  magnesium oxide (MAG-OX) 400 mg (241.3 mg magnesium) tablet Take 1 tablet (400 mg total) by mouth.   Past Week  metoprolol succinate XL (TOPROL-XL) 50 mg 24 hr tablet Take 1 tablet (50 mg total) by mouth daily.   Past Week  amLODIPine (NORVASC) 10 mg tablet Take 1 tablet (10 mg total) by mouth daily.     anidulafungin (ERAXIS) 100 mg in sodium chloride 0.9% 100 mL (Total Volume 130 mL) IVPB Inject 100 mg into the vein. EVERY 2-3 DAYS, OFF voriconazole and cresemba pending insurance approval.     aspirin 81 mg EC delayed release tablet Take 1 tablet (81 mg total) by mouth. (Patient not taking: Reported on 08/05/2022)     atorvastatin (LIPITOR) 40 mg tablet take 1 tablet by mouth at bedtime     cholecalciferol (VITAMIN D-3) 50 mcg (2,000 unit) capsule Take 1 capsule (2,000 Units total) by mouth daily. (Patient not taking: Reported on 07/01/2022)     coenzyme Q10 300 mg Cap Take 300 mg by mouth. (Patient not taking: Reported on 07/01/2022)     isavuconazonium (CRESEMBA) 186 mg capsule Take 2 capsules (372 mg total) by mouth daily. (Patient not taking: Reported on 09/13/2022) 60 capsule 0   LIDOCAINE 2 % solution APPLY TO AREAS AS DIRECTED EVERY 4 HOURS AS NEEDED (Patient not taking: Reported on 09/13/2022)     ondansetron (ZOFRAN-ODT) 8 mg disintegrating tablet Dissolve 1 tablet (8 mg total) by mouth every 12 (  twelve) hours as needed for nausea or vomiting. (Patient not taking: Reported on 09/13/2022) 30 tablet 1   voriconazole (VFEND) 200 mg tablet Take 1 tablet (200 mg total) by mouth every 12 (twelve) hours. (Patient not taking: Reported on 09/12/2022) 30 tablet 5   Allergies Allergies Allergen Reactions  Voriconazole Other (See Comments)   Gingival edema/redness making it difficult to bite down   Review of Systems: Review of Systems Constitutional:  Positive for activity change, appetite change and unexpected weight change (30 lbs over the last month). Negative for chills and fever. HENT: Positive for postnasal drip and rhinorrhea.  Respiratory:  Positive for cough. Negative for shortness of breath and wheezing.  Cardiovascular:  Negative for chest pain and leg swelling. Gastrointestinal:  Negative for abdominal pain, blood in stool, diarrhea, nausea and vomiting. Skin:  Negative for rash. Hematological:  Does not bruise/bleed easily. Objective: Vitals:Last 24 hours: Temp:  [98.6 ?F (37 ?C)] 98.6 ?F (37 ?C)Pulse:  [105] 105Resp:  [20] 20BP: (148)/(80) 148/80SpO2:  [100 %] 100 %Physical Exam: Physical ExamEyes:    Pupils: Pupils are equal, round, and reactive to light. Cardiovascular:    Rate and Rhythm: Normal rate and regular rhythm.    Heart sounds: Normal heart sounds. Pulmonary:    Breath sounds: Normal breath sounds. No wheezing, rhonchi or rales. Abdominal:    General: Abdomen is flat.    Palpations: Abdomen is soft. Musculoskeletal:    Right lower leg: No edema.    Left lower leg: No edema. Skin:   Capillary Refill: Capillary refill takes less than 2 seconds.    Findings: Rash (Foot discoloration) present. Neurological:    General: No focal deficit present.    Mental Status: He is alert and oriented to person, place, and time. Psychiatric:       Mood and Affect: Mood normal. Labs: Last 24 hours: No results found for this or any previous visit (from the past 24 hour(s)).Results for orders placed or performed during the hospital encounter of 09/16/22 SARS CoV-2 (COVID-19) RNA [facility] Labs (Patient Scheduled for Surgery/Procedure)  Specimen: Nasopharynx; Viral Result Value Ref Range  SARS-CoV-2 RNA (COVID-19)  Negative Negative Lactate dehydrogenase Result Value Ref Range  LD 210 122 - 241 U/L Comprehensive metabolic panel Result Value Ref Range  Sodium 137 136 - 144 mmol/L  Potassium 4.1 3.3 - 5.3 mmol/L  Chloride 103 98 - 107 mmol/L  CO2 20 20 - 30 mmol/L  Anion Gap 14 7 - 17 Glucose 130 (H) 70 - 100 mg/dL  BUN 14 8 - 23 mg/dL  Creatinine 1.47 8.29 - 1.30 mg/dL  Calcium 9.4 8.8 - 56.2 mg/dL  BUN/Creatinine Ratio 13.0 8.0 - 23.0  Total Protein 7.8 5.9 - 8.3 g/dL  Albumin 3.8 3.6 - 5.1 g/dL  Total Bilirubin 0.8 <=8.6 mg/dL  Alkaline Phosphatase 578 9 - 122 U/L  Alanine Aminotransferase (ALT) 22 9 - 59 U/L  Aspartate Aminotransferase (AST) 20 10 - 35 U/L  Globulin 4.0 (H) 2.0 - 3.9 g/dL  A/G Ratio 1.0 1.0 - 2.2  AST/ALT Ratio 0.9 Reference Range Not Established  eGFR (Creatinine) >60 >=60 mL/min/1.74m2 CBC auto differential Result Value Ref Range  WBC 0.4 (L) 4.0 - 11.0 x1000/?L  RBC 3.17 (L) 4.00 - 6.00 M/?L  Hemoglobin 8.8 (L) 13.2 - 17.1 g/dL  Hematocrit 46.96 (L) 29.52 - 50.00 %  MCV 82.3 80.0 - 100.0 fL  MCH 27.8 27.0 - 33.0 pg  MCHC 33.7 31.0 - 36.0 g/dL  RDW-CV 84.1 32.4 -  15.0 %  Platelets 9 (LL) 150 - 420 x1000/?L  MPV    Neutrophils    Lymphocytes    Monocytes    Eosinophils    Basophil    Immature Granulocytes    nRBC 0.0 0.0 - 1.0 %  Absolute Lymphocyte Count    Monocyte Absolute Count    Eosinophil Absolute Count    Basophil Absolute Count    Absolute Immature Granulocyte Count    Absolute nRBC 0.00 0.00 - 1.00 x 1000/?L  ANC (Abs Neutrophil Count) 0.05 (L) 2.00 - 7.60 x 1000/?L Immature Platelet Fraction (BH GH LMW YH) Result Value Ref Range  Immature Platelet Fraction 1.5 1.2 - 8.6 %  Absolute Immature Platelet Fraction 0.1 <20.0 x1000/?L Type and screen  Is this a pre-operative order? No Result Value Ref Range  ABO Grouping A   Rh Type POS   Antibody Screen NEG   Specimen Expiration Date And Time 09/19/2022 23:59  Prepare platelets IRRADIATED - please refer to irradiation tab for more information. Result Value Ref Range  BB Product ABO A   BB Product RH POS   BB Product Unit/Donor Number [id number]   BB Product Status TXD   BB Product Code Barcode [phone] BB Product ABO/RH Type Barcode 6200   BB Expiration Date [id number]   Diagnostics:No results found. Assessment: RYLE BUSCEMI is a 72 y.o. 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 He is s/p 2 cycles of chemotherapy with Decitabine + Venetoclax with reduction in blast count. 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 Plan: Day -6:  8/2/2024Conditioning/Transplant: CMML -Melphalan 70 mg/m2 over 30 minutes on D-6-Fludarabine 40 mg/m2 over 30 minutes every 24hr for 4 doses from D-5 to D -2             *Melphalan dosed reduced to 70 mg/m2 due to age-PT cyclophosphamide 25 mg/kg IV over 1.5 hrs every 24hr for 2 doses on D +3 & D +4^PTCy dose reduced by 50% to 25 mg/kg due 10/10 match and age-Mesna 50 mg/kg CIVI daily, for 2 doses, starting 15 minutes before cyclophosphamide on D +3 & D +4-Stem cell infusion day 0 (09/24/22)HEME: - Transfuse pRBCs to maintain Hb >7 g/dL, platelets >41 or if clinically indicated- Filgrastim 64mcg/kg starting on day +5 until stable engraftment of neutrophils.ID: Prophylactic antimicrobials: Acyclovir 400 mg PO TID (home medications), Ciprofloxacin 500 mg po twice daily, Isavuconazole 372 mg po daily (prior authorization pending, voriconazole allergy-gingival hyperplasia), Pentamidine 300 mg inhalation monthly, Pentam Inhalation to be given ~ day +15 for PCP prophylaxis - CMV -/pending, attempt to get letermovir for CMV prophylaxis. Will send script on Day +7 to determine insurance coverage before starting inpatient.Check CMV PCR weekly.- If febrile, panculture, UA and CXR, start broad spectrum empiric antibiotics if neutropenic or septic appearing.   - Repeat blood cx every 24 hours with temperature > 100.4 or septic appearingGVHD Prophylaxis:  - Tacrolimus  mg IV CI, starting day +5.  Goal of 6-11ng/ml- PT cyclophosphamide 25 mg/kg IV over 1.5 hrs every 24hr for 2 doses on D +3 & D +4 with mesna daily on Day +3 and Day +4  - Monitor levels, haptoglobin, LDH, triglycerides.- Daily skin exam, liver function tests twice weekly (minimum), document stool output/day.Fluids, Electrolytes and Nutrition:- Regular diet, consider nutrition consult if needed- Daily weights- Strict Intake and output- Replete electrolytes  PRNEmesis prophylaxis: Ondansetron 16 mg PO every 12 hours w/ PRN Compazine, Dexamethasone 12mg  po premed prior to melphalan High dose cyclophosphamide: doxepin premed 25 mg and diphenhydramine 25 mg 30 mins before Cytoxan; D5W 1/2NS + KCL 20 meq + Magnesium 1 gm alternating with D5W + of NaCHO3 + KCL 20 meq until 24 hours after last Cyclophosphamide dose (hold fluid during Cytoxan infusion)VOD: - Start Ursodiol 500mg  PO BID, continue until Day +90- Daily weights, strict fluid monitoring of all intake/output- Monitor liver function tests and coags at least twice weeklyHTNPatient reports not taking his antiHTN meds for the last few days due to feeling weak. - Will hold home antihypertensive meds for now - monitor BP and reorder as appropriate CVAD: [place] Discharge Planning: Pending count recovery 24 hour caregiver: Signed:Amer Al-Musa, MDPGY-2, Internal Medicine8/03/2022 BMT ATTENDING ADDENDUM: I saw and examined the patient. I discussed the case with the resident Dr. Carolyne Littles and I agree with the history, physical assessment and plan. Please see my separate attending H&P.Sheryle Hail, MD

## 2022-09-18 NOTE — Plan of Care
Plan of Care Overview/ Patient Status   0700-1900Pt is A&Ox4, ambulates independent OOB, VSS on RA, afebrile. Medications taken whole w/ water. R DL [place], dressing C/D/I, flushes w/out difficulty, blood return present. Pt did not endorse pain, nausea, vomiting, or diarrhea. Platelets 8, 1u of platelets administering. No reactions assessed. Oncoming nurse made aware of plts transfusing. Medications administered per MAR. Safety precautions maintained. Melphalan administered per treatment plan. Pre meds given per [facility]. Pt tolerated well. No reactions assessed.

## 2022-09-18 NOTE — Plan of Care
Problem: Adult Inpatient Plan of CareGoal: Readiness for Transition of CareOutcome: Initial problem identification Plan of Care Overview/ Patient Status    Patient's chart was reviewed. 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 Introduced myself to patient to discuss discharge planning. Confirmed address, insurance, and PCP with patient. Patient reports they previously followed with their PCP, but their PCP recently quit. Patient lives in [place] with his wife. While in [place], patient and his spouse live at their daughter's home in [place]. Notes that he has another daughter who lives in [place]. Family will transport patient at discharge. Patient reports no financial concerns, but had questions for social work regarding staying at the suites once discharged. Consult placed by CM. Patient reports he uses the following DME at home: shower chair. Patient notes he does have a transport chair available, but he has not used it yet. Patient's spouse is his caregiver. Patient reports his spouse is very supportive and has attended every doctor's appointment, every chemo treatment, and picks up his prescriptions. Spouse assists with transporting patient, bathing patient, and assisting patient for toileting. He reports he feels steady while ambulating, but has difficulty getting up from a sitting position and notes feeling generalized weakness. When bathing, his wife assists him. He uses the shower chair both in the shower and to dry off. No home care services prior to admission. Discussed home care services with patient, needs to be determined. He feels he may possibly need a home health aide once discharged to assist with toileting and bathing. In his daughter's home in [place], there is a half bathroom on the first floor. The bedroom and shower is located on the second floor, patient notes that he has difficulty with getting up these stairs due to weakness. Needs not determined at this time. Case Management will take lead to arrange post -acute care services and continue to work with the care team as the patient progresses towards discharge.Case Management Screening and Evaluation  Flowsheet Row Most Recent Value Case Management Screening: Chart review completed. If YES to any question below then proceed to CM Eval/Plan  Is there a change in their cognitive function No Do you anticipate that the pt will have any discharge needs requiring CM intervention? Yes Has there been a readmission within the last 30 days and/or four (4) encounters (encounters include: ED, OBS, Inpatient) within the last six (6) months? No Were there services prior to admission ( Examples: Assisted Living, HD, Homecare, Extended Care Facility, Methadone, SNF, Outpatient Infusion Center) No Negative/Positive Screen Positive Screening: Complete CM Evaluation and Plan Case Manager Attestation  I have reviewed the medical record and completed the above screen. CM staff will follow patient's progress and discuss the plan of care with the Treatment Team. Yes Case Management Evaluation and Plan  Arrived from prior to admission home/apartment/condo Do you have a caregiver, or do you anticipate the need for a caregiver given the change in your physicial function? Yes Permission to Speak to Caregiver? Yes Caregiver name: Douglas [name] - spouse Caregiver phone number: [phone] Day(s) Caregiver available 24/7  Lives With Child(ren), Adult, Spouse Services Prior to Admission none Patient Requires Care Coordination Intervention Due To discharge planning needs/concerns Prior to Hospitalization: Assistance Needed/DME being used Bathing, Ambulation Ambulation Assistance/DME: transport chair Bathing Assistance/DME: bed/bath not on 1st floor, shower  chair Documented Insurance Accurate Yes Any financial concerns related to anticipated discharge needs No Patient's home address verified Yes Patient's PCP of record verified Yes  [Followed with PCP, but they recently quit] Last Date Seen by PCP 6-12 months Source of Clinical History  Patient's clinical history has been reviewed and source of Information is: Patient Case Manager Attestation  I have reviewed the medical record and completed the above evaluation with the following recommendations. Yes Discharge Planning Coordination Recommendations  Discharge Planning Coordination Recommendations Needs not determined at this time Case Manager reviewed plan of care/ continuum of care need's with  Patient  Douglas [name], BSN, RN Case Manager

## 2022-09-18 NOTE — Utilization Review (ED)
UM Status: Meets Inpatient Status: admitted for Allo Stem Cell Transplant: Dx CMML: 09-18-22: 10/10 MUD Alo PBSCT conditioning with Mel/Flud/PTCy: Day 0 will be 8-8-24Karen V. Noland Fordyce, RNCM/[facility]

## 2022-09-18 NOTE — H&P
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  Initial evaluation from [facility].  He initially presented with leukopenia, thrombocytopenia, and monocytosis.  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  Initial bone marrow was performed on August 24, 2019, was reviewed at [facility] with findings of multilineage dysplasia.  The marrow was 40% cellular.  Megakaryocytes showed frequent dysplasia with  hyperchromatic forms and clustering.  Blasts were not increased.  A FISH panel for MDS  loci was negative.  Karyotype was normal.  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  The patient was observed and was without any treatment or transfusions.  In 2023, he had an acute drop in hemoglobin, was found to be iron deficient.  Workup for this did not show a clear focus of blood loss or bleeding.  The patient has continued evaluation and established care with Dr. Sherrell Puller in June 2023. Dr. Dema Severin assessment in June 2023 per the CPSS molecular score being considered intermediate one with a low risk of development of leukemia and median survival over 5 years.  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  The patient had a marrow performed at [facility] on April 29th with CD34 stain showing 10% to 15% blasts focally more than 20%.  Marrow was hypercellular at 70%.  Dysplastic megakaryocytes again appreciated.  Given the findings, treatment with the decitabine and venetoclax was recommended.  He received 2 cycles between April and June of 2024. BMBx on 06/2022 showed hypercellular (60-70%), Myeloid neoplasm, 5% blasts. BMBx on 08/2022 was hypocellular (<5%), no increase in blasts  Pre-transplant workup included [place] scans. Abdominal scan showed a lesion in the L2 vertebrae which is non-specific but an MRI was suggested. This was obtained on July 8th and showed a 3 cm lesion of  unclear origin possibly a hemangioma.  PFT in July looked normal, cardiac ECHO with a EF of 64%.  He was seen by the leukemia service who has concerns for possible gingival hyperplasia. This was thought to may be due to voriconazole, which was stopped. Antifungal was changed to cresemba. His last cycle of chemo was June 4th,   He has not recovered his counts and is receiving frequent platelet transfusions. He has remained neutropenic. Has had a slight cough with phlegm in the past 2 weeks Recent Covid swab was negative. He has a post nasal drip. Cough is positional. No fevers.  PAST MEDICAL HX:He does not have a significant infectious history, he did have COVID-19 in the past from which he recovered. He has hypertension and hyperlipidemia managed by cardiologist in [place]. He reports a negative stress test in the past. He has had number of skin cancers and has had several MOHS procedures. He is followed with a urologist for some BPH and abnormal PSA. He has had 2 prostate biopsies which was negative.  He carries a diagnosis of iron deficiency and require transfusion in [place] after a significant and acute drop in hemoglobin. There was no clinical evidence of bleeding. Endoscopy and colonoscopy are planned to exclude a source of bleeding.  SURGICAL HX: Tonsillectomy,  adenoidecotmy, prostate biopsy, Moh's SOCIAL HX: Douglas [name] lives with is wife in [place].  He is currently spending the summer in [place] visiting family.  He has 2 daughters and 5 grandchildren.  Retired, having worked for [employer].  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  He is a social drinker of 1-2 beers per week, denies illicit drug use and is a non smoker.  Covid-19 vaccine series completed. Personal  history of Covid-19 02/2020 Performance status KPS 80% AllergiesNo Known Allergies Current Outpatient Medications:   acyclovir (ZOVIRAX) 400 mg tablet, Take 1 tablet (400 mg total) by mouth 2 (two) times daily., Disp: 60 tablet, Rfl: 5  amLODIPine (NORVASC) 10 mg tablet, Take 1 tablet (10 mg total) by mouth., Disp: , Rfl:   amLODIPine (NORVASC) 10 mg tablet, Take 1 tablet (10 mg total) by mouth daily., Disp: , Rfl:   aspirin 81 mg EC delayed release tablet, Take 1 tablet (81 mg total) by mouth. (Patient not taking: Reported on 08/05/2022), Disp: , Rfl:   atorvastatin (LIPITOR) 40 mg tablet, take 1 tablet by mouth at bedtime, Disp: , Rfl:   cholecalciferol (VITAMIN D-3) 50 mcg (2,000 unit) capsule, Take 1 capsule (2,000 Units total) by mouth daily. (Patient not taking: Reported on 07/01/2022), Disp: , Rfl:   coenzyme Q10 300 mg Cap, Take 300 mg by mouth. (Patient not taking: Reported on 07/01/2022), Disp: , Rfl:   finasteride (PROSCAR) 5 mg tablet, TAKE 1 TABLET BY MOUTH DAILY FOR ENLARGED PROSTATE WITH URINATION PROBLEM TAKE WITH OR WITHOUT MEALS, Disp: , Rfl:   latanoprost (XALATAN) 0.005 % ophthalmic solution, Place 1 drop into both eyes nightly., Disp: , Rfl:   levoFLOXacin (LEVAQUIN) 500 mg tablet, Take 1 tablet (500 mg total) by mouth daily., Disp: , Rfl:   LIDOCAINE 2 % solution, APPLY TO AREAS AS DIRECTED EVERY 4 HOURS AS NEEDED, Disp: , Rfl:   lisinopriL (PRINIVIL,ZESTRIL) 40 mg tablet, take 1 tablet by mouth every day in the morning, Disp: , Rfl:   magnesium oxide (MAG-OX) 400 mg (241.3 mg magnesium) tablet, Take 1 tablet (400 mg total) by mouth., Disp: , Rfl:   metoprolol succinate XL (TOPROL-XL) 50 mg 24 hr tablet, Take 1 tablet (50 mg total) by mouth daily., Disp: , Rfl:   ondansetron (ZOFRAN-ODT) 8 mg disintegrating tablet, Dissolve 1 tablet (8 mg total) by mouth every 12 (twelve) hours as needed for nausea or vomiting., Disp: 30 tablet, Rfl: 1  Cresemba PHYSICAL EXAM:Temp:  [98.6 ?F (37 ?C)] 98.6 ?F (37 ?C)Pulse:  [105] 105Resp:  [20] 20BP: (148)/(80) 148/80SpO2:  [100 %] 100 %General Appearance:He is heavy set, appears wellHEENT:  Mucous membranes moist, No oral lesions, no erythema, teeth are adequate repair prominent gingival hyperplasia notedLungs: ClearCor: RRR, no m/r/gAbd: soft nontender, no hepatosplenomegaly, Extr: trace to 1+ edema, petechiae on the feet and toes. Skin: Some actinic changesLymph Nodes - no palpable adenopathy. Neurologic: mentation appears normal, normal speech, strength symmetric.   LABS Latest Reference Range & Units 09/09/22 12:28 09/09/22 15:33 09/11/22 12:31 09/13/22 09:16 09/16/22 10:29 WBC 4.0 - 11.0 x1000/?L 0.4 (L)  0.5 (L) 0.3 (L) 0.4 (L) RBC 4.00 - 6.00 M/?L 2.82 (L)  2.99 (L) 2.71 (L) 3.17 (L) Hemoglobin 13.2 - 17.1 g/dL 7.9 (L)  8.4 (L) 7.7 (L) 8.8 (L) Hematocrit 38.50 - 50.00 % 23.30 (L)  24.70 (L) 22.30 (L) 26.10 (L) MCV 80.0 -  100.0 fL 82.6  82.6 82.3 82.3 MCH 27.0 - 33.0 pg 28.0  28.1 28.4 27.8 MCHC 31.0 - 36.0 g/dL 62.1  30.8 65.7 84.6 RDW-CV 11.0 - 15.0 % 14.4  14.6 14.7 14.3 Platelets 150 - 420 x1000/?L 9 (LL) 23 (L) 14 (L) 18 (L) 9 (LL) MPV  Comment Only  Comment Only Comment Only Comment Only Neutrophils  Comment Only  Comment Only Comment Only Comment Only Lymphocytes  Comment Only  Comment Only Comment Only Comment Only Monocytes  Comment Only  Comment Only Comment Only Comment Only Eosinophils  Comment Only  Comment Only Comment Only Comment Only Basophils  Comment Only  Comment Only Comment Only Comment Only Immature Granulocytes  Comment Only  Comment Only Comment Only Comment Only nRBC 0.0 - 1.0 % 0.0  0.0 0.0 0.0 ANC (Abs Neutrophil Count) 2.00 - 7.60 x 1000/?L 0.04 (L)  0.04 (L) 0.04 (L) 0.05 (L)   Latest Reference Range & Units 09/09/22 12:28 09/11/22 12:31 09/13/22 09:16 09/16/22 10:29 Sodium 136 - 144 mmol/L 138 136 136 137 Potassium 3.3 - 5.3 mmol/L 4.2 4.2 3.5 4.1 Chloride 98 - 107 mmol/L 105 104 104 103 CO2 20 - 30 mmol/L 20 21 20 20  Anion Gap 7 - 17  13 11 12 14  BUN 8 - 23 mg/dL 13 12 11 14  Creatinine 0.40 - 1.30 mg/dL 9.62 9.52 8.41 3.24 BUN/Creatinine Ratio 8.0 - 23.0  12.3 12.4 13.4 14.0 eGFR (Creatinine) >=60 mL/min/1.11m2 >60 >60 >60 >60 Glucose 70 - 100 mg/dL 98 89 401 (H) 027 (H) Calcium 8.8 - 10.2 mg/dL 9.2 9.4 8.5 (L) 9.4 Total Bilirubin <=1.2 mg/dL 2.53.6 6.44.0 0.5 0.8 Bilirubin, Direct <=0.3 mg/dL 0.2 0.2   Alkaline Phosphatase 9 - 122 U/L 111 104 97 103 Alanine Aminotransferase (ALT) 9 - 59 U/L 20 19 20 22  Aspartate Aminotransferase (AST) 10 - 35 U/L 16 16 21 20  AST/ALT Ratio Reference Range Not Established  0.8 0.8 1.1 0.9 LD 122 - 241 U/L 225 228 202 210 Total Protein 5.9 - 8.3 g/dL 7.9 7.8 7.0 7.8 Albumin 3.6 - 5.1 g/dL 3.9 3.8 3.5 (L) 3.8 Globulin 2.0 - 3.9 g/dL 4.0 (H) 4.0 (H) 3.5 4.0 (H) A/G Ratio 1.0 - 2.2  1.0 1.0 1.0 1.0 IMAGING: Cardiac echo:  Mildly increased left ventricular cavity size.  Normal left ventricle wall thickness.  No regional wall motion abnormalities.  Normal left ventricular systolic function.  LVEF calculated by 3DE was 64%.  Normal global longitudinal strain (-21%).  Normal diastolic function and filling pressures.* Normal right ventricular cavity size and systolic function.  Estimated right ventricular systolic pressure is 33 mmHg.* Atria are normal in size.* Mild mitral regurgitation.* Mild tricuspid regurgitation.* Mild pulmonic regurgitation.* No significant pericardial effusion.[place] chest/abdomen/pelvis 08/18/22: No suspicious nodules or masses. No findings to suggest pneumonia.Mild interstitial lung abnormalities of the bases bilaterally, nonspecific. 1. No acute findings in the abdomen or pelvis. 2. Nonspecific sclerotic lesion in the L2 vertebral body which is difficult to characterize.IMPRESSION/PLAN: Douglas [name] is 72 y.o. 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  His disease was initially characterized by minor cytopenias over a long period of observation with recently worsened anemia, thrombocytopenia, and a marrow with increased blasts consistent with disease progression with mutations in mutations in TET2, [name], and ASXL1. He has been treated with decitabine and venetoclax with reduction in blast count. The patient has had a formal  search of the registry and a 10/10 match was identified. He is now admitted for reduced intensity conditioning  with melphalan and fludarabine. A Mel dose of 70mg /m2 is being employed due to age and the dose of ptcy has been modified to 25mg /kg x 2 given age and fully matched MUD.   CONDITIONING:Melphalan 70 mg/m2 over 30 minutes on D-6Fludarabine 40 mg/m2 over 30 minutes every 24hr for 4 doses from D-5 to D -2High dose cyclophosphamide 25 mg/kg IV over 1.5 hrs every 24hr for 2 doses on D +3 & D +4. Doxepin premed 25 mg and diphenhydramine 25 mg 30 mins before Cytoxan; D5W 1/2NS + KCL 20 meq + Magnesium 1 gm alternating with D5W + of NaCHO3 + KCL 20 meq until 24 hours after last Cyclophosphamide dose (hold fluid during Cytoxan infusion) Mesna 25 mg/kg CIVI daily, for 2 doses, starting 15 minutes before cyclophosphamide on D +3 & D +4Cells: Fresh cells, no LR ID: Prophylaxis with Acyclovir 400 mg PO TID, Ciprofloxacin 500 mg po twice daily, Isavuconazole 372 mg po daily, Pentamidine 300 mg inhalation monthly. If develops neutropenic fever will start Zosyn/Vancomycin.  Check DFA with PCRCMV: R-/D Toxoplasmosis: R-/D, Varicella:  R+/DHEME: Pancytopenia at baseline. Transfuse to keep Hgb >7 and platelets >10. If GI bleeding re-occurs, will adjust goals. 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 Tacrolimus CYP3A5 intermediate metabolizer (*1/*3); CYP3A4 normal metabolizer (*1/*1) - Tacrolimus dosing will be based CPIC guideline 0.02 mg/kg/day IV converted to PO.VOD: trend, LFTs, coags, weights post transplant. Ursodiol 500 mg twice daily SUPPORTIVE CARE: Ondansetron 16 mg PO every 12 hours w/ PRN Compazine, Dexamethasone 12mg  po premed prior to melphalan Sheryle Hail, MD

## 2022-09-19 ENCOUNTER — Ambulatory Visit: Admit: 2022-09-19 | Payer: MEDICARE

## 2022-09-19 LAB — CBC WITH AUTO DIFFERENTIAL
BKR WAM ABSOLUTE NRBC (2 DEC): 0 x 1000/ÂµL (ref 0.00–1.00)
BKR WAM ANC (ABSOLUTE NEUTROPHIL COUNT): 0.06 x 1000/ÂµL — ABNORMAL LOW (ref 2.00–7.60)
BKR WAM HEMATOCRIT (2 DEC): 21.8 % — ABNORMAL LOW (ref 38.50–50.00)
BKR WAM HEMOGLOBIN: 7.6 g/dL — ABNORMAL LOW (ref 13.2–17.1)
BKR WAM IMMATURE GRANULOCYTES: 93 U/L (ref 9–122)
BKR WAM MCH (PG): 28.6 pg (ref 27.0–33.0)
BKR WAM MCHC: 34.9 g/dL (ref 31.0–36.0)
BKR WAM MCV: 82 fL (ref 80.0–100.0)
BKR WAM MPV: 0.78 mg/dL (ref 0.40–1.30)
BKR WAM NUCLEATED RED BLOOD CELLS: 0 % (ref 0.0–1.0)
BKR WAM PLATELETS: 20 x1000/ÂµL — ABNORMAL LOW (ref 150–420)
BKR WAM RDW-CV: 14.1 % — ABNORMAL HIGH (ref 11.0–15.0)
BKR WAM RED BLOOD CELL COUNT.: 2.66 M/ÂµL — ABNORMAL LOW (ref 4.00–6.00)
BKR WAM WHITE BLOOD CELL COUNT: 0.2 x1000/ÂµL — ABNORMAL LOW (ref 4.0–11.0)

## 2022-09-19 LAB — RESPIRATORY VIRUS PCR PANEL     (BH GH LMW Q YH)
BKR ADENOVIRUS: NEGATIVE
BKR CORONAVIRUS 229E: NEGATIVE
BKR CORONAVIRUS HKU1: NEGATIVE
BKR CORONAVIRUS NL63: NEGATIVE
BKR CORONAVIRUS OC43: NEGATIVE
BKR HUMAN METAPNEUMOVIRUS (HMPV): NEGATIVE
BKR INFLUENZA A: NEGATIVE
BKR INFLUENZA B: NEGATIVE
BKR PARAINFLUENZA VIRUS 1: NEGATIVE
BKR PARAINFLUENZA VIRUS 2: NEGATIVE
BKR PARAINFLUENZA VIRUS 3: NEGATIVE
BKR PARAINFLUENZA VIRUS 4: NEGATIVE
BKR RESPIRATORY SYNCYTIAL VIRUS: NEGATIVE
BKR RHINOVIRUS: NEGATIVE
BKR SARS-COV-2 RNA (COVID-19) (YH): NEGATIVE

## 2022-09-19 LAB — COMPREHENSIVE METABOLIC PANEL
BKR A/G RATIO: 0.9 — ABNORMAL LOW (ref 1.0–2.2)
BKR ALANINE AMINOTRANSFERASE (ALT): 21 U/L (ref 9–59)
BKR ALBUMIN: 3.5 g/dL — ABNORMAL LOW (ref 3.6–5.1)
BKR ALKALINE PHOSPHATASE: 93 U/L (ref 9–122)
BKR ANION GAP: 11 g/dL (ref 7–17)
BKR ASPARTATE AMINOTRANSFERASE (AST): 14 U/L (ref 10–35)
BKR AST/ALT RATIO: 0.7
BKR BILIRUBIN TOTAL: 0.5 mg/dL (ref <=1.2)
BKR BLOOD UREA NITROGEN: 12 mg/dL — ABNORMAL LOW (ref 8–23)
BKR BUN / CREAT RATIO: 15.4 (ref 8.0–23.0)
BKR CALCIUM: 9.1 mg/dL (ref 8.8–10.2)
BKR CHLORIDE: 104 mmol/L (ref 98–107)
BKR CO2: 21 mmol/L (ref 20–30)
BKR CREATININE: 0.78 mg/dL (ref 0.40–1.30)
BKR EGFR, CREATININE (CKD-EPI 2021): >60 mL/min/{1.73_m2} (ref >=60)
BKR GLOBULIN: 3.7 g/dL (ref 2.0–3.9)
BKR GLUCOSE: 133 mg/dL — ABNORMAL HIGH (ref 70–100)
BKR POTASSIUM: 4.5 mmol/L (ref 3.3–5.3)
BKR PROTEIN TOTAL: 7.2 g/dL (ref 5.9–8.3)
BKR SODIUM: 136 mmol/L — ABNORMAL LOW (ref 136–144)

## 2022-09-19 LAB — IMMATURE PLATELET FRACTION (BH GH LMW YH)
BKR WAM IPF, ABSOLUTE: 0.4 x1000/ÂµL (ref 20–<20.0)
BKR WAM IPF: 2.2 % (ref 1.2–8.6)

## 2022-09-19 NOTE — Progress Notes
BMT ATTENDING NOTEDIAGNOSIS: Douglas [name] is 72 y.o. 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 Douglas [name] feels well. No overnight events. No nausea.  Allergies: No Known AllergiesMedications: Scheduled Meds:Current Facility-Administered Medications Medication Dose Route Frequency Provider Last Rate Last Admin  [START ON 09/24/2022] acetaminophen (TYLENOL) tablet 650 mg  650 mg Oral Once Chrystine Oiler, MD      acyclovir (ZOVIRAX) capsule 400 mg  400 mg Oral Q8H Chrystine Oiler, MD   400 mg at 09/19/22 1344  chlorhexidine gluconate (PERIDEX) 0.12 % solution 15 mL  15 mL Mouth/Throat BID Roetta Sessions, MD   15 mL at 09/19/22 0844  ciprofloxacin HCl (CIPRO) tablet 500 mg  500 mg Oral Q12H Chrystine Oiler, MD   500 mg at 09/19/22 0845  [START ON 09/27/2022] cycloPHOSphamide (CYTOXAN) 2,640 mg in sodium chloride 0.9% 500 mL infusion  25 mg/kg (Treatment Plan Recorded) Intravenous Q24H Chrystine Oiler, MD      Melene Muller ON 09/27/2022] diphenhydrAMINE (BENADRYL) injection 25 mg  25 mg IV Push Q24H Chrystine Oiler, MD      Melene Muller ON 09/24/2022] diphenhydrAMINE (BENADRYL) injection 25 mg  25 mg IV Push Once Chrystine Oiler, MD      Melene Muller ON 09/27/2022] doxepin (SINEquan) capsule 25 mg  25 mg Oral Q24H Chrystine Oiler, MD      Melene Muller ON 09/29/2022] filgrastim-sndz (ZARXIO) syringe 480 mcg  5 mcg/kg (Treatment Plan Recorded) Subcutaneous Daily (1700) Chrystine Oiler, MD      finasteride (PROSCAR) tablet 5 mg  5 mg Oral Daily Roetta Sessions, MD   5 mg at 09/19/22 0845  FLUDARabine (FLUDARA) 92 mg in dextrose 5 % in water (D5W) 100 mL infusion  40 mg/m2 (Treatment Plan Recorded) Intravenous Q24H Chrystine Oiler, MD 200 mL/hr at 09/19/22 1516 92 mg at 09/19/22 1516  isavuconazonium (Cresemba) capsule Cap 372 mg  372 mg Oral Daily Chrystine Oiler, MD   372 mg at 09/19/22 0845  latanoprost (XALATAN) 0.005 % ophthalmic solution 1 drop  1 drop Both Eyes Nightly Roetta Sessions, MD   1 drop at 09/18/22 2048  [START ON 09/27/2022] mesna (MESNEX) 2,650 mg in sodium chloride 0.9% 500 mL infusion  25 mg/kg (Treatment Plan Recorded) Intravenous Q24H Chrystine Oiler, MD      ondansetron (ZOFRAN-ODT) disintegrating tablet 16 mg  16 mg Translingual Q12H Chrystine Oiler, MD   16 mg at 09/19/22 1645  sodium chloride 0.9 % flush 3 mL  3 mL IV Push Q8H Roetta Sessions, MD      Melene Muller ON 09/29/2022] tacrolimus (PROGRAF) immediate release capsule 3 mg  3 mg Oral Q12H (0600-1800) Chrystine Oiler, MD      ursodioL (URSO 250) tablet 500 mg  500 mg Oral BID Chrystine Oiler, MD   500 mg at 09/19/22 0845 Continuous Infusions: [START ON 09/26/2022] D5 1/2 NS 1,000 mL with potassium chloride 20 mEq, magnesium sulfate 1 g infusion    [START ON 09/27/2022] potassium chloride 20 mEq, sodium bicarbonate 50 mEq in D5 1/2 NS 1,000 mL infusion   PRN Meds:.[START ON 09/24/2022] albuterol, [START ON 09/24/2022] diphenhydrAMINE, [START ON 09/24/2022] EPINEPHrine, [START ON 09/24/2022] EPINEPHrine, [START ON 09/24/2022] famotidine (PEPCID) IV (All ages), [START ON 09/24/2022] hydrocortisone IV orderable (all ages), LORazepam, prochlorperazine edisylate, [START ON 09/24/2022] sodium chloride 0.9 % (new bag),  sodium chloride, sodium chloride, sodium chloride   PHYSICAL EXAM:BP 126/75  - Pulse 78  - Temp 97.3 ?F (36.3 ?C) (Oral)  - Resp 20  - Wt 102.9 kg  - SpO2 98%  - BMI 31.63 kg/m? General Appearance:He is heavy set, appears wellHEENT:  Mucous membranes moist, No oral lesions, no erythemaLungs: ClearCor: RRR, no m/r/gAbd: soft nontender, no hepatosplenomegaly, Extr: trace to 1+ edema, petechiae on the feet and toes. Skin: Some actinic changesNeurologic: mentation appears normal, normal speech, strength symmetric.   LABSLab Results Component Value Date/Time  WBC 0.2 (L) 09/19/2022 05:07 AM  HGB 7.6 (L) 09/19/2022 05:07 AM  HCT 21.80 (L) 09/19/2022 05:07 AM  PLT 20 (L) 09/19/2022 05:07 AM  Lab Results Component Value Date/Time  NEUTROPHILS  09/19/2022 05:07 AM    Comment:    Diff Not Performed  Lab Results Component Value Date/Time  NA 136 09/19/2022 05:07 AM  K 4.5 09/19/2022 05:07 AM  CL 104 09/19/2022 05:07 AM  CO2 21 09/19/2022 05:07 AM  BUN 12 09/19/2022 05:07 AM  CREATININE 0.78 09/19/2022 05:07 AM  GLU 133 (H) 09/19/2022 05:07 AM  ANIONGAP 11 09/19/2022 05:07 AM  Lab Results Component Value Date/Time  CALCIUM 9.1 09/19/2022 05:07 AM  MG 2.0 09/18/2022 04:32 PM  PHOS 2.2 09/06/2022 10:00 AM  Lab Results Component Value Date/Time  ALT 21 09/19/2022 05:07 AM  AST 14 09/19/2022 05:07 AM  ALKPHOS 93 09/19/2022 05:07 AM  BILITOT 0.5 09/19/2022 05:07 AM  BILIDIR 0.2 09/11/2022 12:31 PM  ALBUMIN 3.5 (L) 09/19/2022 05:07 AM  Lab Results Component Value Date/Time  PTT 33.7 (H) 09/18/2022 04:32 PM  LABPROT 12.6 (H) 09/18/2022 04:32 PM  INR 1.15 (H) 09/18/2022 04:32 PM  Results in Past 7 DaysResult Component Current Result LD 236 (09/18/2022)  IMPRESSION/PLAN: Douglas [name] is 72 y.o. 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  His disease was initially characterized by minor cytopenias over a long period of observation with recently worsened anemia, thrombocytopenia, and a marrow with increased blasts consistent with disease progression with mutations in mutations in TET2, [name], and ASXL1. He has been treated with decitabine and venetoclax with reduction in blast count. The patient has had a formal  search of the registry and a 10/10 match was identified. He is now admitted for reduced intensity conditioning with melphalan and fludarabine. A Mel dose of 70mg /m2 is being employed due to age and the dose of ptcy has been modified to 25mg /kg x 2 given age and fully matched MUD.    CONDITIONING:Melphalan 70 mg/m2 over 30 minutes on D-6Fludarabine 40 mg/m2 over 30 minutes every 24hr for 4 doses from D-5 to D -2High dose cyclophosphamide 25 mg/kg IV over 1.5 hrs every 24hr for 2 doses on D +3 & D +4. Doxepin premed 25 mg and diphenhydramine 25 mg 30 mins before Cytoxan; D5W 1/2NS + KCL 20 meq + Magnesium 1 gm alternating with D5W + of NaCHO3 + KCL 20 meq until 24 hours after last Cyclophosphamide dose (hold fluid during Cytoxan infusion) [place] 25 mg/kg CIVI daily, for 2 doses, starting 15 minutes before cyclophosphamide on D +3 & D +4Cells: Fresh cells, no LR  ID: Prophylaxis with Acyclovir 400 mg PO TID, Ciprofloxacin 500 mg po twice daily, Isavuconazole 372 mg po daily, Pentamidine 300 mg inhalation monthly. If develops neutropenic fever will start Zosyn/Vancomycin.  Check DFA with PCRCMV: R-/D Toxoplasmosis: R-/D, Varicella:  R+/D HEME: Pancytopenia at baseline. Transfuse to keep Hgb >7 and platelets >10. If GI bleeding re-occurs,  will adjust goals. 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 Tacrolimus CYP3A5 intermediate metabolizer (*1/*3); CYP3A4 normal metabolizer (*1/*1) - Tacrolimus dosing will be based CPIC guideline 0.02 mg/kg/day IV converted to PO. VOD: trend, LFTs, coags, weights post transplant. Ursodiol 500 mg twice daily  SUPPORTIVE CARE: Ondansetron 16 mg PO every 12 hours w/ PRN Compazine, Dexamethasone 12mg  po premed prior to melphalan  Sheryle Hail, MD

## 2022-09-19 NOTE — Plan of Care
Problem: Fall Injury RiskGoal: Absence of Fall and Fall-Related InjuryOutcome: Interventions implemented as appropriate Problem: InfectionGoal: Absence of Infection Signs and SymptomsOutcome: Interventions implemented as appropriate Problem: Adult Inpatient Plan of CareGoal: Optimal Comfort and WellbeingOutcome: Interventions implemented as appropriate Plan of Care Overview/ Patient Status    1900-0730:Patient A&Ox4, VSS on RA. Independent OOB w/ a steady gait.  Voids spontaneously, up to toilet. LBM 8/1. No N/V/D this shift. No complaints of pain this shift.  Skin intact. Medications taken whole with water. RDL hickman, dressing clean, dry, and intact, flushes well without difficulty, blood return present. No repletion's this shift.  Platelet infusion completed.Safety/Fall precautions maintained this shift. Call bell and personal items within reach. Safety and hourly rounds completed throughout shift. Care plan maintained throughout shift. Please see flow sheets for further details.  RN Burns [place]

## 2022-09-20 LAB — COMPREHENSIVE METABOLIC PANEL
BKR A/G RATIO: 1 (ref 1.0–2.2)
BKR ALANINE AMINOTRANSFERASE (ALT): 19 U/L (ref 9–59)
BKR ALBUMIN: 3.3 g/dL — ABNORMAL LOW (ref 3.6–5.1)
BKR ALKALINE PHOSPHATASE: 78 U/L (ref 9–122)
BKR ANION GAP: 12 (ref 7–17)
BKR ASPARTATE AMINOTRANSFERASE (AST): 14 U/L (ref 10–35)
BKR AST/ALT RATIO: 0.7
BKR BILIRUBIN TOTAL: 0.4 mg/dL (ref <=1.2)
BKR BLOOD UREA NITROGEN: 18 mg/dL — ABNORMAL LOW (ref 8–23)
BKR BUN / CREAT RATIO: 18.6 (ref 8.0–23.0)
BKR CALCIUM: 8.7 mg/dL — ABNORMAL LOW (ref 8.8–10.2)
BKR CHLORIDE: 105 mmol/L (ref 98–107)
BKR CO2: 22 mmol/L (ref 20–30)
BKR CREATININE: 0.97 mg/dL (ref 0.40–1.30)
BKR EGFR, CREATININE (CKD-EPI 2021): >60 mL/min/{1.73_m2} (ref >=60)
BKR GLOBULIN: 3.3 g/dL (ref 2.0–3.9)
BKR GLUCOSE: 96 mg/dL (ref 70–100)
BKR POTASSIUM: 3.5 mmol/L (ref 3.3–5.3)
BKR PROTEIN TOTAL: 6.6 g/dL (ref 5.9–8.3)
BKR SODIUM: 139 mmol/L (ref 136–144)

## 2022-09-20 LAB — CBC WITH AUTO DIFFERENTIAL
BKR WAM ABSOLUTE IMMATURE GRANULOCYTES.: >60 mL/min/{1.73_m2} (ref >=60)
BKR WAM ABSOLUTE LYMPHOCYTE COUNT.: 14 U/L (ref 10–35)
BKR WAM ABSOLUTE NRBC (2 DEC): 0 x 1000/ÂµL (ref 0.00–1.00)
BKR WAM ANC (ABSOLUTE NEUTROPHIL COUNT): 0.04 x 1000/ÂµL — ABNORMAL LOW (ref 2.00–7.60)
BKR WAM BASOPHIL ABSOLUTE COUNT.: 0.7
BKR WAM BASOPHILS: 0.4 mg/dL (ref <=1.2)
BKR WAM EOSINOPHIL ABSOLUTE COUNT.: 1 (ref 1.0–2.2)
BKR WAM EOSINOPHILS: 3.3 g/dL — ABNORMAL LOW (ref 3.6–5.1)
BKR WAM HEMATOCRIT (2 DEC): 19.7 % — ABNORMAL LOW (ref 38.50–50.00)
BKR WAM HEMOGLOBIN: 6.7 g/dL — ABNORMAL LOW (ref 13.2–17.1)
BKR WAM IMMATURE GRANULOCYTES: 78 U/L (ref 9–122)
BKR WAM LYMPHOCYTES: 18.6 (ref 8.0–23.0)
BKR WAM MCH (PG): 28 pg (ref 27.0–33.0)
BKR WAM MCHC: 34 g/dL (ref 31.0–36.0)
BKR WAM MCV: 82.4 fL (ref 80.0–100.0)
BKR WAM MONOCYTE ABSOLUTE COUNT.: 3.3 g/dL (ref 2.0–3.9)
BKR WAM MONOCYTES: 6.6 g/dL (ref 5.9–8.3)
BKR WAM MPV: 0.97 mg/dL (ref 0.40–1.30)
BKR WAM NEUTROPHILS: 8.7 mg/dL — ABNORMAL LOW (ref 8.8–10.2)
BKR WAM NUCLEATED RED BLOOD CELLS: 0 % (ref 0.0–1.0)
BKR WAM PLATELETS: 14 x1000/ÂµL — ABNORMAL LOW (ref 150–420)
BKR WAM RDW-CV: 14.2 % (ref 11.0–15.0)
BKR WAM RED BLOOD CELL COUNT.: 2.39 M/ÂµL — ABNORMAL LOW (ref 4.00–6.00)
BKR WAM WHITE BLOOD CELL COUNT: 0.2 x1000/ÂµL — ABNORMAL LOW (ref 4.0–11.0)

## 2022-09-20 LAB — IMMATURE PLATELET FRACTION (BH GH LMW YH)
BKR WAM IPF, ABSOLUTE: 0.4 x1000/ÂµL (ref <20.0)
BKR WAM IPF: 2.8 % (ref 1.2–8.6)

## 2022-09-20 LAB — VRE CULTURE: BKR VRE SCREEN: NEGATIVE

## 2022-09-20 NOTE — Progress Notes
BMT ATTENDING NOTEDIAGNOSIS: ZAYED GRIFFIE is 72 y.o. 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 [name] feels well. No overnight events. No nausea.  Allergies: No Known AllergiesMedications: Scheduled Meds:Current Facility-Administered Medications Medication Dose Route Frequency Provider Last Rate Last Admin  [START ON 09/24/2022] acetaminophen (TYLENOL) tablet 650 mg  650 mg Oral Once Chrystine Oiler, MD      acyclovir (ZOVIRAX) capsule 400 mg  400 mg Oral Q8H Chrystine Oiler, MD   400 mg at 09/20/22 0559  chlorhexidine gluconate (PERIDEX) 0.12 % solution 15 mL  15 mL Mouth/Throat BID Roetta Sessions, MD   15 mL at 09/20/22 0820  ciprofloxacin HCl (CIPRO) tablet 500 mg  500 mg Oral Q12H Chrystine Oiler, MD   500 mg at 09/20/22 0820  [START ON 09/27/2022] cycloPHOSphamide (CYTOXAN) 2,640 mg in sodium chloride 0.9% 500 mL infusion  25 mg/kg (Treatment Plan Recorded) Intravenous Q24H Chrystine Oiler, MD      Melene Muller ON 09/27/2022] diphenhydrAMINE (BENADRYL) injection 25 mg  25 mg IV Push Q24H Chrystine Oiler, MD      Melene Muller ON 09/24/2022] diphenhydrAMINE (BENADRYL) injection 25 mg  25 mg IV Push Once Chrystine Oiler, MD      Melene Muller ON 09/27/2022] doxepin (SINEquan) capsule 25 mg  25 mg Oral Q24H Chrystine Oiler, MD      Melene Muller ON 09/29/2022] filgrastim-sndz (ZARXIO) syringe 480 mcg  5 mcg/kg (Treatment Plan Recorded) Subcutaneous Daily (1700) Chrystine Oiler, MD      finasteride (PROSCAR) tablet 5 mg  5 mg Oral Daily Roetta Sessions, MD   5 mg at 09/20/22 0820  FLUDARabine (FLUDARA) 92 mg in dextrose 5 % in water (D5W) 100 mL infusion  40 mg/m2 (Treatment Plan Recorded) Intravenous Q24H Chrystine Oiler, MD   Stopped at 09/19/22 1546  isavuconazonium (Cresemba) capsule Cap 372 mg  372 mg Oral Daily Chrystine Oiler, MD   372 mg at 09/20/22 0820  latanoprost (XALATAN) 0.005 % ophthalmic solution 1 drop  1 drop Both Eyes Nightly Roetta Sessions, MD   1 drop at 09/19/22 2038  [START ON 09/27/2022] mesna (MESNEX) 2,650 mg in sodium chloride 0.9% 500 mL infusion  25 mg/kg (Treatment Plan Recorded) Intravenous Q24H Chrystine Oiler, MD      ondansetron (ZOFRAN-ODT) disintegrating tablet 16 mg  16 mg Translingual Q12H Chrystine Oiler, MD   16 mg at 09/20/22 0559  sodium chloride 0.9 % flush 3 mL  3 mL IV Push Q8H Roetta Sessions, MD      Melene Muller ON 09/29/2022] tacrolimus (PROGRAF) immediate release capsule 3 mg  3 mg Oral Q12H (0600-1800) Chrystine Oiler, MD      ursodioL (URSO 250) tablet 500 mg  500 mg Oral BID Chrystine Oiler, MD   500 mg at 09/20/22 0820 Continuous Infusions: [START ON 09/26/2022] D5 1/2 NS 1,000 mL with potassium chloride 20 mEq, magnesium sulfate 1 g infusion    [START ON 09/27/2022] potassium chloride 20 mEq, sodium bicarbonate 50 mEq in D5 1/2 NS 1,000 mL infusion   PRN Meds:.[START ON 09/24/2022] albuterol, [START ON 09/24/2022] diphenhydrAMINE, [START ON 09/24/2022] EPINEPHrine, [START ON 09/24/2022] EPINEPHrine, [START ON 09/24/2022] famotidine (PEPCID) IV (All ages), [START ON 09/24/2022] hydrocortisone IV orderable (all ages), LORazepam, prochlorperazine edisylate, [START ON 09/24/2022] sodium chloride 0.9 % (new bag), sodium chloride, sodium chloride,  sodium chloride   PHYSICAL EXAM:BP 127/71  - Pulse 71  - Temp 97.6 ?F (36.4 ?C) (Oral)  - Resp 18  - Wt 102.9 kg  - SpO2 99%  - BMI 31.63 kg/m? General Appearance:He is heavy set, appears wellHEENT:  Mucous membranes moist, No oral lesions, no erythemaLungs: ClearCor: RRR, no m/r/gAbd: soft nontender, no hepatosplenomegaly, Extr: trace to 1+ edema, petechiae on the feet and toes. Skin: Some actinic changesNeurologic: mentation appears normal, normal speech, strength symmetric.   LABSLab Results Component Value Date/Time  WBC 0.2 (L) 09/20/2022 05:57 AM  HGB 6.7 (L) 09/20/2022 05:57 AM  HCT 19.70 (L) 09/20/2022 05:57 AM  PLT 14 (L) 09/20/2022 05:57 AM  Lab Results Component Value Date/Time  NEUTROPHILS  09/20/2022 05:57 AM    Comment:    Diff Not Performed  Lab Results Component Value Date/Time  NA 139 09/20/2022 05:57 AM  K 3.5 09/20/2022 05:57 AM  CL 105 09/20/2022 05:57 AM  CO2 22 09/20/2022 05:57 AM  BUN 18 09/20/2022 05:57 AM  CREATININE 0.97 09/20/2022 05:57 AM  GLU 96 09/20/2022 05:57 AM  ANIONGAP 12 09/20/2022 05:57 AM  Lab Results Component Value Date/Time  CALCIUM 8.7 (L) 09/20/2022 05:57 AM  MG 2.0 09/18/2022 04:32 PM  PHOS 2.2 09/06/2022 10:00 AM  Lab Results Component Value Date/Time  ALT 19 09/20/2022 05:57 AM  AST 14 09/20/2022 05:57 AM  ALKPHOS 78 09/20/2022 05:57 AM  BILITOT 0.4 09/20/2022 05:57 AM  BILIDIR 0.2 09/11/2022 12:31 PM  ALBUMIN 3.3 (L) 09/20/2022 05:57 AM  Lab Results Component Value Date/Time  PTT 33.7 (H) 09/18/2022 04:32 PM  LABPROT 12.6 (H) 09/18/2022 04:32 PM  INR 1.15 (H) 09/18/2022 04:32 PM  Results in Past 7 DaysResult Component Current Result LD 236 (09/18/2022)  IMPRESSION/PLAN: DAYLE MCNERNEY is 72 y.o. 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  His disease was initially characterized by minor cytopenias over a long period of observation with recently worsened anemia, thrombocytopenia, and a marrow with increased blasts consistent with disease progression with mutations in mutations in TET2, [name], and ASXL1. He has been treated with decitabine and venetoclax with reduction in blast count. The patient has had a formal  search of the registry and a 10/10 match was identified. He is now admitted for reduced intensity conditioning with melphalan and fludarabine. A Mel dose of 70mg /m2 is being employed due to age and the dose of PTCy has been modified to 25mg /kg x 2 given age and fully matched MUD.    CONDITIONING:Melphalan 70 mg/m2 over 30 minutes on D-6Fludarabine 40 mg/m2 over 30 minutes every 24hr for 4 doses from D-5 to D -2High dose cyclophosphamide 25 mg/kg IV over 1.5 hrs every 24hr for 2 doses on D +3 & D +4. Doxepin premed 25 mg and diphenhydramine 25 mg 30 mins before Cytoxan; D5W 1/2NS + KCL 20 meq + Magnesium 1 gm alternating with D5W + of NaCHO3 + KCL 20 meq until 24 hours after last Cyclophosphamide dose (hold fluid during Cytoxan infusion) Mesna 25 mg/kg CIVI daily, for 2 doses, starting 15 minutes before cyclophosphamide on D +3 & D +4Cells: Fresh cells, no LR  ID: Prophylaxis with Acyclovir 400 mg PO TID, Ciprofloxacin 500 mg po twice daily, Isavuconazole 372 mg po daily, Pentamidine 300 mg inhalation monthly. If develops neutropenic fever will start Zosyn/Vancomycin.  Check DFA with PCRCMV: R-/D Toxoplasmosis: R-/D, Varicella:  R+/D HEME: Pancytopenia at baseline. Transfuse to keep Hgb >7 and platelets >10. If GI bleeding re-occurs, will adjust goals. PRBC  transfusion today. 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 Tacrolimus CYP3A5 intermediate metabolizer (*1/*3); CYP3A4 normal metabolizer (*1/*1) - Tacrolimus dosing will be based CPIC guideline 0.02 mg/kg/day IV converted to PO. VOD: trend, LFTs, coags, weights post transplant. Ursodiol 500 mg twice daily  SUPPORTIVE CARE: Ondansetron 16 mg PO every 12 hours w/ PRN Compazine, Dexamethasone 12mg  po premed prior to melphalan  Sheryle Hail, MD

## 2022-09-20 NOTE — Progress Notes
[facility] Inpatient Progress Note ATTENDING PROVIDER: Sheryle Hail, MD DIAGNOSIS:  CMML progressed to AML   CONDITIONING:  Melph/FluTRANSPLANT DATE:   8/8/24DONOR: Male 10/10ABO: Recipient A Pos, Donor A PosCMV: Neg, NegACTIVE ISSUES:ConditioningPancytopeniaSubjective: Interim History: No events overnight. Tolerated chemotherapy. Eating well. Wife at bedside. Pleased he does not need transfusions today. Review of Systems Constitutional: Negative.  Negative for chills, fever and malaise/fatigue. HENT: Negative.  Negative for sore throat.  Eyes: Negative.  Respiratory: Negative.  Negative for cough, sputum production and shortness of breath.  Cardiovascular: Negative.  Negative for chest pain, palpitations and leg swelling. Gastrointestinal: Negative.  Negative for abdominal pain, constipation, diarrhea, nausea and vomiting. Genitourinary:  Negative for dysuria, frequency and urgency. Musculoskeletal: Negative.  Skin: Negative.  Negative for itching and rash.      Toes discolored - not new Neurological: Negative.  Psychiatric/Behavioral: Negative.   Review of Allergies/Meds/Hx: Allergies Allergen Reactions  Voriconazole Other (See Comments)   Gingival edema/redness making it difficult to bite down  Medications:Current Facility-Administered Medications:   [START ON 09/24/2022] acetaminophen (TYLENOL) tablet 650 mg, 650 mg, Oral, Once, Chrystine Oiler, MD  acyclovir (ZOVIRAX) capsule 400 mg, 400 mg, Oral, Q8H, Chrystine Oiler, MD, 400 mg at 09/19/22 0503  [START ON 09/24/2022] albuterol neb sol 2.5 mg/3 mL (0.083%) (PROVENTIL,VENTOLIN), 2.5 mg, Nebulization, Q30 MIN PRN, Chrystine Oiler, MD  albuterol sulfate 90 mcg/actuation HFA aerosol inhaler 2 puff, 2 puff, Inhalation, Once, Chrystine Oiler, MD  chlorhexidine gluconate (PERIDEX) 0.12 % solution 15 mL, 15 mL, Mouth/Throat, BID, Al-Musa, Amer, MD  ciprofloxacin HCl (CIPRO) tablet 500 mg, 500 mg, Oral, Q12H, Chrystine Oiler, MD, 500 mg at 09/18/22 2049  [START ON 09/27/2022] cycloPHOSphamide (CYTOXAN) 2,640 mg in sodium chloride 0.9% 500 mL infusion, 25 mg/kg (Treatment Plan Recorded), Intravenous, Q24H, Chrystine Oiler, MD  [START ON 09/26/2022] D5 1/2 NS 1,000 mL with potassium chloride 20 mEq, magnesium sulfate 1 g infusion, 150 mL/hr, Intravenous, Continuous, Chrystine Oiler, MD  [START ON 09/27/2022] diphenhydrAMINE (BENADRYL) injection 25 mg, 25 mg, IV Push, Q24H, Chrystine Oiler, MD  [START ON 09/24/2022] diphenhydrAMINE (BENADRYL) injection 25 mg, 25 mg, IV Push, Once, Chrystine Oiler, MD  Melene Muller ON 09/24/2022] diphenhydrAMINE (BENADRYL) injection 50 mg, 50 mg, IV Push, Once PRN, Chrystine Oiler, MD  Melene Muller ON 09/27/2022] doxepin (SINEquan) capsule 25 mg, 25 mg, Oral, Q24H, Chrystine Oiler, MD  Melene Muller ON 09/24/2022] EPINEPHrine (EPI-PEN) auto-injector 0.3 mg, 0.3 mg, Intramuscular, Q15 MIN PRN, Chrystine Oiler, MD  Melene Muller ON 09/24/2022] EPINEPHrine (EPI-PEN) auto-injector 0.3 mg, 0.3 mg, Intramuscular, Q15 MIN PRN, Chrystine Oiler, MD  Melene Muller ON 09/24/2022] famotidine (PF) (PEPCID) 20 mg in sodium chloride 0.9% PF 5 mL Injection, 20 mg, IV Push, Once PRN, Chrystine Oiler, MD  Melene Muller ON 09/29/2022] filgrastim-sndz (ZARXIO) syringe 480 mcg, 5 mcg/kg (Treatment Plan Recorded), Subcutaneous, Daily (1700), Chrystine Oiler, MD  finasteride (PROSCAR) tablet 5 mg, 5 mg, Oral, Daily, Al-Musa, Amer, MD  FLUDARabine (FLUDARA) 92 mg in dextrose 5 % in water (D5W) 100 mL infusion, 40 mg/m2 (Treatment Plan Recorded), Intravenous, Q24H, Chrystine Oiler, MD  Melene Muller ON 09/24/2022] hydrocortisone sodium succinate (PF) (Solu-CORTEF) injection 100 mg, 100 mg, IV Push, Once PRN, Chrystine Oiler, MD  isavuconazonium (Cresemba) capsule Cap 372 mg, 372 mg, Oral, Daily, Chrystine Oiler, MD, 372 mg at 09/18/22 1617  latanoprost (XALATAN) 0.005 % ophthalmic solution 1 drop, 1 drop, Both Eyes, Nightly, Al-Musa, Amer, MD, 1 drop at 09/18/22 2048  LORazepam (ATIVAN) tablet 0.5 mg, 0.5 mg, Oral, Q6H PRN, Lenard Forth,  Lu Duffel, MD  [START ON 09/27/2022] mesna (MESNEX) 2,650 mg in sodium chloride 0.9% 500 mL infusion, 25 mg/kg (Treatment Plan Recorded), Intravenous, Q24H, Chrystine Oiler, MD  ondansetron (ZOFRAN-ODT) disintegrating tablet 16 mg, 16 mg, Translingual, Q12H, Chrystine Oiler, MD, 16 mg at 09/19/22 0503  pentamidine 300 mg INHALATION solution, 300 mg, Nebulization, Once, Chrystine Oiler, MD  Melene Muller ON 09/27/2022] potassium chloride 20 mEq, sodium bicarbonate 50 mEq in D5 1/2 NS 1,000 mL infusion, 150 mL/hr, Intravenous, Continuous, Chrystine Oiler, MD  prochlorperazine edisylate (COMPAZINE) injection 10 mg, 10 mg, IV Push, Q6H PRN, Chrystine Oiler, MD  Melene Muller ON 09/24/2022] sodium chloride 0.9 % (new bag) bolus 500 mL, 500 mL, Intravenous, Once PRN, Chrystine Oiler, MD  sodium chloride 0.9 % flush 10 mL, 10 mL, IV Push, PRN for Line Care, Chrystine Oiler, MD  sodium chloride 0.9 % flush 3 mL, 3 mL, IV Push, Q8H, Al-Musa, Amer, MD  sodium chloride 0.9 % flush 3 mL, 3 mL, IV Push, PRN for Line Care, Roetta Sessions, MD  sodium chloride 0.9% infusion, 5 mL/hr, Intravenous, PRN, Chrystine Oiler, MD  Melene Muller ON 09/29/2022] tacrolimus (PROGRAF) immediate release capsule 3 mg, 3 mg, Oral, Q12H (0600-1800), Chrystine Oiler, MD  ursodioL (URSO 250) tablet 500 mg, 500 mg, Oral, BID, Chrystine Oiler, MD, 500 mg at 09/18/22 1617Objective: Temp:  [97.4 ?F (36.3 ?C)-98.9 ?F (37.2 ?C)] 97.4 ?F (36.3 ?C)Pulse:  [65-105] 72Resp:  [18-20] 18BP: (105-148)/(65-80) 135/71SpO2:  [98 %-100 %] 100 %I/O's:Gross Totals (Last 24 hours) at 09/19/2022 0656Last data filed at 09/18/2022 2030Intake 361 ml Output -- Net 361 ml Wt: 09/18/22 102.9 kg 09/16/22 104.2 kg 09/13/22 104.3 kg 09/11/22 105.9 kg 09/09/22 105.4 kg 09/04/22 106.4 kg Physical ExamConstitutional:     Appearance: Normal appearance. He is normal weight. He is not ill-appearing or diaphoretic. HENT:    Head: Normocephalic.    Nose: Nose normal.    Mouth/Throat:    Mouth: Mucous membranes are moist.    Pharynx: Oropharynx is clear. No oropharyngeal exudate. Eyes:    General: No scleral icterus.      Right eye: No discharge.       Left eye: No discharge. Cardiovascular:    Rate and Rhythm: Normal rate and regular rhythm.    Heart sounds: Normal heart sounds. No murmur heard.Pulmonary:    Effort: Pulmonary effort is normal. No respiratory distress.    Breath sounds: Normal breath sounds. No wheezing or rales. Abdominal:    General: Abdomen is flat. Bowel sounds are normal. There is no distension.    Palpations: Abdomen is soft.    Tenderness: There is no abdominal tenderness. There is no guarding. Musculoskeletal:       General: No swelling or deformity. Normal range of motion.    Right lower leg: No edema.    Left lower leg: No edema. Skin:   General: Skin is warm.    Findings: No rash.    Comments: Toes hyperpigmetned/?PVD - not newBruises, petechiae scattered Neurological:    Mental Status: He is alert and oriented to person, place, and time. Psychiatric:       Mood and Affect: Mood normal. Labs:Recent Results (from the past 24 hour(s)) Lactate dehydrogenase  Collection Time: 09/18/22  4:32 PM Result Value Ref Range  LD 236 122 - 241 U/L Haptoglobin  Collection Time: 09/18/22  4:32 PM Result Value Ref Range  Haptoglobin 479 (H) 30 - 200 mg/dL Magnesium  Collection Time: 09/18/22  4:32 PM Result Value Ref Range  Magnesium 2.0 1.7 -  2.4 mg/dL Type and screen  Collection Time: 09/18/22  4:32 PM Result Value Ref Range  ABO Grouping A   Rh Type POS   Antibody Screen NEG   Specimen Expiration Date And Time 09/21/2022 23:59  NT-proBrain natriuretic peptide  Collection Time: 09/18/22  4:32 PM Result Value Ref Range  NT-proBNP 175.0 (H) <125.0 pg/mL PT/INR and PTT (BH GH L LMW YH)  Collection Time: 09/18/22  4:32 PM Result Value Ref Range  Prothrombin Time 12.6 (H) 9.6 - 12.3 seconds  INR 1.15 (H) 0.86 - 1.12  PTT 33.7 (H) 23.0 - 31.4 seconds Triglycerides  Collection Time: 09/18/22  4:32 PM Result Value Ref Range  Triglycerides 68 See Comment mg/dL CBC auto differential  Collection Time: 09/18/22  4:32 PM Result Value Ref Range  WBC 0.3 (L) 4.0 - 11.0 x1000/?L  RBC 2.83 (L) 4.00 - 6.00 M/?L  Hemoglobin 8.0 (L) 13.2 - 17.1 g/dL  Hematocrit 03.47 (L) 42.59 - 50.00 %  MCV 82.3 80.0 - 100.0 fL  MCH 28.3 27.0 - 33.0 pg  MCHC 34.3 31.0 - 36.0 g/dL  RDW-CV 56.3 87.5 - 64.3 %  Platelets 8 (LL) 150 - 420 x1000/?L  MPV    Neutrophils    Lymphocytes    Monocytes    Eosinophils    Basophil    Immature Granulocytes    nRBC 0.0 0.0 - 1.0 %  Absolute Lymphocyte Count    Monocyte Absolute Count    Eosinophil Absolute Count    Basophil Absolute Count    Absolute Immature Granulocyte Count    Absolute nRBC 0.00 0.00 - 1.00 x 1000/?L  ANC (Abs Neutrophil Count) 0.07 (L) 2.00 - 7.60 x 1000/?L Comprehensive metabolic panel  Collection Time: 09/18/22  4:32 PM Result Value Ref Range  Sodium 133 (L) 136 - 144 mmol/L  Potassium 3.6 3.3 - 5.3 mmol/L  Chloride 99 98 - 107 mmol/L  CO2 21 20 - 30 mmol/L  Anion Gap 13 7 - 17  Glucose 196 (H) 70 - 100 mg/dL  BUN 14 8 - 23 mg/dL  Creatinine 3.29 5.18 - 1.30 mg/dL  Calcium 9.0 8.8 - 84.1 mg/dL  BUN/Creatinine Ratio 66.0 8.0 - 23.0  Total Protein 7.5 5.9 - 8.3 g/dL  Albumin 3.7 3.6 - 5.1 g/dL  Total Bilirubin 0.7 <=6.3 mg/dL  Alkaline Phosphatase 99 9 - 122 U/L  Alanine Aminotransferase (ALT) 22 9 - 59 U/L  Aspartate Aminotransferase (AST) 16 10 - 35 U/L  Globulin 3.8 2.0 - 3.9 g/dL  A/G Ratio 1.0 1.0 - 2.2 AST/ALT Ratio 0.7 Reference Range Not Established  eGFR (Creatinine) >60 >=60 mL/min/1.74m2 Immature Platelet Fraction (BH GH LMW YH)  Collection Time: 09/18/22  4:32 PM Result Value Ref Range  Immature Platelet Fraction 1.9 1.2 - 8.6 %  Absolute Immature Platelet Fraction 0.2 <20.0 x1000/?L CBC auto differential  Collection Time: 09/19/22  5:07 AM Result Value Ref Range  WBC 0.2 (L) 4.0 - 11.0 x1000/?L  RBC 2.66 (L) 4.00 - 6.00 M/?L  Hemoglobin 7.6 (L) 13.2 - 17.1 g/dL  Hematocrit 01.60 (L) 10.93 - 50.00 %  MCV 82.0 80.0 - 100.0 fL  MCH 28.6 27.0 - 33.0 pg  MCHC 34.9 31.0 - 36.0 g/dL  RDW-CV 23.5 57.3 - 22.0 %  Platelets 20 (L) 150 - 420 x1000/?L  MPV    Neutrophils    Lymphocytes    Monocytes    Eosinophils    Basophil    Immature Granulocytes    nRBC 0.0  0.0 - 1.0 %  Absolute Lymphocyte Count    Monocyte Absolute Count    Eosinophil Absolute Count    Basophil Absolute Count    Absolute Immature Granulocyte Count    Absolute nRBC 0.00 0.00 - 1.00 x 1000/?L  ANC (Abs Neutrophil Count) 0.06 (L) 2.00 - 7.60 x 1000/?L Comprehensive metabolic panel  Collection Time: 09/19/22  5:07 AM Result Value Ref Range  Sodium 136 136 - 144 mmol/L  Potassium 4.5 3.3 - 5.3 mmol/L  Chloride 104 98 - 107 mmol/L  CO2 21 20 - 30 mmol/L  Anion Gap 11 7 - 17  Glucose 133 (H) 70 - 100 mg/dL  BUN 12 8 - 23 mg/dL  Creatinine 4.69 6.29 - 1.30 mg/dL  Calcium 9.1 8.8 - 52.8 mg/dL  BUN/Creatinine Ratio 41.3 8.0 - 23.0  Total Protein 7.2 5.9 - 8.3 g/dL  Albumin 3.5 (L) 3.6 - 5.1 g/dL  Total Bilirubin 0.5 <=2.4 mg/dL  Alkaline Phosphatase 93 9 - 122 U/L  Alanine Aminotransferase (ALT) 21 9 - 59 U/L  Aspartate Aminotransferase (AST) 14 10 - 35 U/L  Globulin 3.7 2.0 - 3.9 g/dL  A/G Ratio 0.9 (L) 1.0 - 2.2  AST/ALT Ratio 0.7 Reference Range Not Established  eGFR (Creatinine) >60 >=60 mL/min/1.44m2 Immature Platelet Fraction (BH GH LMW YH) Collection Time: 09/19/22  5:07 AM Result Value Ref Range  Immature Platelet Fraction 2.2 1.2 - 8.6 %  Absolute Immature Platelet Fraction 0.4 <20.0 x1000/?L Diagnostics:No results found.Assessment: MANRAJ YEO is a 72 y.o. 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 He is s/p 2 cycles of chemotherapy with Decitabine + Venetoclax with reduction in blast count. 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  Plan:  Day -3: 09/19/2022, continue supporitve care.  Conditioning/Transplant: CMML -Melphalan 70 mg/m2 over 30 minutes on D-6, *Melphalan dosed reduced to 70 mg/m2 due to age-Fludarabine 40 mg/m2 over 30 minutes every 24hr for 4 doses from D-5 to D -2             -PT cyclophosphamide 25 mg/kg IV over 1.5 hrs every 24hr for 2 doses on D +3 & D +4 (reduced by 50% to 25 mg/kg due 10/10 match and age)-Mesna 50 mg/kg CIVI daily, for 2 doses, starting 15 minutes before cyclophosphamide on D +3 & D +4-Stem cell infusion day 0 (09/24/22) HEME: - Transfuse pRBCs to maintain Hb >7 g/dL, platelets >40 or if clinically indicated- Filgrastim 53mcg/kg starting on day +5 until stable engraftment of neutrophils. ID: Prophylactic antimicrobials: Acyclovir 400 mg PO TID (home medications), Ciprofloxacin 500 mg po twice daily, Isavuconazole 372 mg po daily (prior authorization pending, voriconazole allergy-gingival hyperplasia), Pentamidine 300 mg inhalation monthly- CMV -/pending, attempt to get letermovir for CMV prophylaxis. Will send script on Day +7 to determine insurance coverage before starting inpatient.Check CMV PCR weekly.- If febrile, panculture, UA and CXR, start broad spectrum empiric antibiotics if neutropenic or septic appearing.   - Repeat blood cx every 24 hours with temperature > 100.4 or septic appearing GVHD Prophylaxis:  - Tacrolimus  3mg  PO starting day +5.  Goal of 6-11ng/ml- PT cyclophosphamide 25 mg/kg IV over 1.5 hrs every 24hr for 2 doses on D +3 & D +4 with mesna daily on Day +3 and Day +4  (PTCy dose reduced by 50% )- Monitor levels, haptoglobin, LDH, triglycerides.- Daily skin exam,  liver function tests twice weekly (minimum), document stool output/day. Fluids, Electrolytes and Nutrition:- Regular diet, consider nutrition consult if needed- Daily weights- Strict Intake and output- Replete electrolytes PRNEmesis prophylaxis: Ondansetron 16 mg PO every 12 hours w/ PRN Compazine, Dexamethasone 12mg  po premed prior to melphalan High dose cyclophosphamide: doxepin premed 25 mg and diphenhydramine 25 mg 30 mins before Cytoxan; D5W 1/2NS + KCL 20 meq + Magnesium 1 gm alternating with D5W + of NaCHO3 + KCL 20 meq until 24 hours after last Cyclophosphamide dose (hold fluid during Cytoxan infusion) VOD: - Continue Ursodiol 500mg  PO BID, continue until Day +90- Daily weights, strict fluid monitoring of all intake/output- Monitor liver function tests and coags at least twice weekly HTNPatient reports not taking his antiHTN meds for the last few days due to feeling weak. - Will hold home antihypertensive meds for now - monitor BP and reorder as appropriate  CVAD: [facility] for chemotherapy and supportive care Discharge Planning: Pending count recovery 24 hour caregiver: Elenore Rota, [id number] [phone] or [facility]-H[facility] Kloss

## 2022-09-20 NOTE — Progress Notes
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 No chest pain, heart palpitations, cough or shortness of breath. No nausea or vomiting. No appetite. Typically eats only two meals per day. No diarrhea or constipation. Voiding without difficulty. No rash, fever or chills. Very fatigued, does not feel like getting out of bed. Review of Systems Constitutional: Negative.  Negative for chills, fever and malaise/fatigue. HENT: Negative.  Negative for sore throat.  Eyes: Negative.  Respiratory: Negative.  Negative for cough, sputum production and shortness of breath.  Cardiovascular: Negative.  Negative for chest pain, palpitations and leg swelling. Gastrointestinal: Negative.  Negative for abdominal pain, constipation, diarrhea, nausea and vomiting. Genitourinary:  Negative for dysuria, frequency and urgency. Musculoskeletal: Negative.  Skin: Negative.  Negative for itching and rash.      Toes discolored - not new Neurological: Negative.  Psychiatric/Behavioral: Negative.   Review of Allergies/Meds/Hx: Allergies Allergen Reactions  Voriconazole Other (See Comments)   Gingival edema/redness making it difficult to bite down  Medications:Current Facility-Administered Medications:   [START ON 09/24/2022] acetaminophen (TYLENOL) tablet 650 mg, 650 mg, Oral, Once, Chrystine Oiler, MD  acyclovir (ZOVIRAX) capsule 400 mg, 400 mg, Oral, Q8H, Chrystine Oiler, MD, 400 mg at 09/20/22 0559  [START ON 09/24/2022] albuterol neb sol 2.5 mg/3 mL (0.083%) (PROVENTIL,VENTOLIN), 2.5 mg, Nebulization, Q30 MIN PRN, Chrystine Oiler, MD  chlorhexidine gluconate (PERIDEX) 0.12 % solution 15 mL, 15 mL, Mouth/Throat, BID, Carolyne Littles, Amer, MD, 15 mL at 09/19/22 2038  ciprofloxacin HCl (CIPRO) tablet 500 mg, 500 mg, Oral, Q12H, Chrystine Oiler, MD, 500 mg at 09/19/22 2037  [START ON 09/27/2022] cycloPHOSphamide (CYTOXAN) 2,640 mg in sodium chloride 0.9% 500 mL infusion, 25 mg/kg (Treatment Plan Recorded), Intravenous, Q24H, Chrystine Oiler, MD  [START ON 09/26/2022] D5 1/2 NS 1,000 mL with potassium chloride 20 mEq, magnesium sulfate 1 g infusion, 150 mL/hr, Intravenous, Continuous, Chrystine Oiler, MD  [START ON 09/27/2022] diphenhydrAMINE (BENADRYL) injection 25 mg, 25 mg, IV Push, Q24H, Chrystine Oiler, MD  [START ON 09/24/2022] diphenhydrAMINE (BENADRYL) injection 25 mg, 25 mg, IV Push, Once, Chrystine Oiler, MD  Melene Muller ON 09/24/2022] diphenhydrAMINE (BENADRYL) injection 50 mg, 50 mg, IV Push, Once PRN, Chrystine Oiler, MD  Melene Muller ON 09/27/2022] doxepin (SINEquan) capsule 25 mg, 25 mg, Oral, Q24H, Chrystine Oiler, MD  Melene Muller ON 09/24/2022] EPINEPHrine (EPI-PEN) auto-injector 0.3 mg, 0.3 mg, Intramuscular, Q15 MIN PRN, Chrystine Oiler, MD  Melene Muller ON 09/24/2022] EPINEPHrine (EPI-PEN) auto-injector 0.3 mg, 0.3 mg, Intramuscular, Q15 MIN PRN, Chrystine Oiler, MD  Melene Muller ON 09/24/2022] famotidine (PF) (PEPCID) 20 mg in sodium chloride 0.9% PF 5 mL Injection, 20 mg, IV Push, Once PRN, Chrystine Oiler, MD  Melene Muller ON 09/29/2022] filgrastim-sndz (ZARXIO) syringe 480 mcg, 5 mcg/kg (Treatment Plan Recorded), Subcutaneous, Daily (1700), Chrystine Oiler, MD  finasteride (PROSCAR) tablet 5 mg, 5 mg, Oral, Daily, Al-Musa, Amer, MD, 5 mg at 09/19/22 0845  FLUDARabine (FLUDARA) 92 mg in dextrose 5 % in water (D5W) 100 mL infusion, 40 mg/m2 (Treatment Plan Recorded), Intravenous, Q24H, Chrystine Oiler, MD, Stopped at 09/19/22 1546  [START ON 09/24/2022] hydrocortisone sodium succinate (PF) (Solu-CORTEF) injection 100 mg, 100 mg, IV Push, Once PRN, Chrystine Oiler, MD  isavuconazonium (Cresemba) capsule Cap 372 mg, 372 mg, Oral, Daily, Chrystine Oiler, MD, 372 mg at  09/19/22 0845  latanoprost (XALATAN) 0.005 % ophthalmic solution 1 drop, 1 drop, Both Eyes, Nightly, Al-Musa, Amer, MD, 1 drop at 09/19/22 2038  LORazepam (ATIVAN) tablet 0.5 mg, 0.5 mg, Oral, Q6H PRN, Chrystine Oiler, MD  [START ON 09/27/2022] mesna (MESNEX) 2,650 mg in sodium chloride 0.9% 500 mL infusion, 25 mg/kg (Treatment Plan Recorded), Intravenous, Q24H, Chrystine Oiler, MD  ondansetron (ZOFRAN-ODT) disintegrating tablet 16 mg, 16 mg, Translingual, Q12H, Chrystine Oiler, MD, 16 mg at 09/20/22 0559  [START ON 09/27/2022] potassium chloride 20 mEq, sodium bicarbonate 50 mEq in D5 1/2 NS 1,000 mL infusion, 150 mL/hr, Intravenous, Continuous, Chrystine Oiler, MD  prochlorperazine edisylate (COMPAZINE) injection 10 mg, 10 mg, IV Push, Q6H PRN, Chrystine Oiler, MD  Melene Muller ON 09/24/2022] sodium chloride 0.9 % (new bag) bolus 500 mL, 500 mL, Intravenous, Once PRN, Chrystine Oiler, MD  sodium chloride 0.9 % flush 10 mL, 10 mL, IV Push, PRN for Line Care, Chrystine Oiler, MD  sodium chloride 0.9 % flush 3 mL, 3 mL, IV Push, Q8H, Al-Musa, Amer, MD  sodium chloride 0.9 % flush 3 mL, 3 mL, IV Push, PRN for Line Care, Roetta Sessions, MD  sodium chloride 0.9% infusion, 5 mL/hr, Intravenous, PRN, Chrystine Oiler, MD  Melene Muller ON 09/29/2022] tacrolimus (PROGRAF) immediate release capsule 3 mg, 3 mg, Oral, Q12H (0600-1800), Chrystine Oiler, MD  ursodioL (URSO 250) tablet 500 mg, 500 mg, Oral, BID, Chrystine Oiler, MD, 500 mg at 09/19/22 2037Objective: Temp:  [97.3 ?F (36.3 ?C)-98.3 ?F (36.8 ?C)] 98.3 ?F (36.8 ?C)Pulse:  [68-113] 68Resp:  [18-20] 18BP: (115-138)/(65-80) 115/65SpO2:  [98 %-100 %] 98 %I/O's:Gross Totals (Last 24 hours) at 09/20/2022 0729Last data filed at 09/19/2022 2000Intake 97.23 ml Output -- Net 97.23 ml Wt: 09/18/22 102.9 kg 09/16/22 104.2 kg 09/13/22 104.3 kg 09/11/22 105.9 kg 09/09/22 105.4 kg 09/04/22 106.4 kg Physical ExamConstitutional:     Appearance: Normal appearance. He is normal weight. He is not ill-appearing or diaphoretic. HENT:    Head: Normocephalic.    Nose: Nose normal.    Mouth/Throat:    Mouth: Mucous membranes are moist.    Pharynx: Oropharynx is clear. No oropharyngeal exudate. Eyes:    General: No scleral icterus.      Right eye: No discharge.       Left eye: No discharge. Cardiovascular:    Rate and Rhythm: Normal rate and regular rhythm.    Heart sounds: Normal heart sounds. No murmur heard.Pulmonary:    Effort: Pulmonary effort is normal. No respiratory distress.    Breath sounds: Normal breath sounds. No wheezing or rales. Abdominal:    General: Abdomen is flat. Bowel sounds are normal. There is no distension.    Palpations: Abdomen is soft.    Tenderness: There is no abdominal tenderness. There is no guarding. Musculoskeletal:       General: No swelling or deformity. Normal range of motion.    Right lower leg: No edema.    Left lower leg: No edema. Skin:   General: Skin is warm.    Findings: No rash.    Comments: Toes hyperpigmetned/?PVD - not newBruises, petechiae scattered Neurological:    Mental Status: He is alert and oriented to person, place, and time. Psychiatric:       Mood and Affect: Mood normal. Labs:Recent Results (from the past 24 hour(s)) CBC auto differential  Collection Time: 09/20/22  5:57 AM Result Value Ref Range  WBC 0.2 (L) 4.0 - 11.0 x1000/?L  RBC 2.39 (L) 4.00 - 6.00 M/?L  Hemoglobin 6.7 (  L) 13.2 - 17.1 g/dL  Hematocrit 65.78 (L) 46.96 - 50.00 %  MCV 82.4 80.0 - 100.0 fL  MCH 28.0 27.0 - 33.0 pg  MCHC 34.0 31.0 - 36.0 g/dL  RDW-CV 29.5 28.4 - 13.2 %  Platelets 14 (L) 150 - 420 x1000/?L  MPV    Neutrophils    Lymphocytes Monocytes    Eosinophils    Basophil    Immature Granulocytes    nRBC 0.0 0.0 - 1.0 %  Absolute Lymphocyte Count    Monocyte Absolute Count    Eosinophil Absolute Count    Basophil Absolute Count    Absolute Immature Granulocyte Count    Absolute nRBC 0.00 0.00 - 1.00 x 1000/?L  ANC (Abs Neutrophil Count) 0.04 (L) 2.00 - 7.60 x 1000/?L Comprehensive metabolic panel  Collection Time: 09/20/22  5:57 AM Result Value Ref Range  Sodium 139 136 - 144 mmol/L  Potassium 3.5 3.3 - 5.3 mmol/L  Chloride 105 98 - 107 mmol/L  CO2 22 20 - 30 mmol/L  Anion Gap 12 7 - 17  Glucose 96 70 - 100 mg/dL  BUN 18 8 - 23 mg/dL  Creatinine 4.40 1.02 - 1.30 mg/dL  Calcium 8.7 (L) 8.8 - 10.2 mg/dL  BUN/Creatinine Ratio 72.5 8.0 - 23.0  Total Protein 6.6 5.9 - 8.3 g/dL  Albumin 3.3 (L) 3.6 - 5.1 g/dL  Total Bilirubin 0.4 <=3.6 mg/dL  Alkaline Phosphatase 78 9 - 122 U/L  Alanine Aminotransferase (ALT) 19 9 - 59 U/L  Aspartate Aminotransferase (AST) 14 10 - 35 U/L  Globulin 3.3 2.0 - 3.9 g/dL  A/G Ratio 1.0 1.0 - 2.2  AST/ALT Ratio 0.7 Reference Range Not Established  eGFR (Creatinine) >60 >=60 mL/min/1.42m2 Immature Platelet Fraction (BH GH LMW YH)  Collection Time: 09/20/22  5:57 AM Result Value Ref Range  Immature Platelet Fraction 2.8 1.2 - 8.6 %  Absolute Immature Platelet Fraction 0.4 <20.0 x1000/?L Diagnostics:No results found.Assessment: Douglas [name] is a 72 y.o. 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 He is s/p 2 cycles of chemotherapy with Decitabine + Venetoclax with reduction in blast count. 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  Plan:  Day -2:  09/20/2022, transfusing pRBC. Encouraged grazing during the day. Continues flu Conditioning/Transplant: CMML -Melphalan 70 mg/m2 over 30 minutes on D-6, *Melphalan dosed reduced to 70 mg/m2 due to age-Fludarabine 40 mg/m2 over 30 minutes every 24hr for 4 doses from D-5 to D -2             -PT cyclophosphamide 25 mg/kg IV over 1.5 hrs every 24hr for 2 doses on D +3 & D +4 (reduced by 50% to 25 mg/kg due 10/10 match and age)-Mesna 50 mg/kg CIVI daily, for 2 doses, starting 15 minutes before cyclophosphamide on D +3 & D +4-Stem cell infusion day 0 (09/24/22) HEME: - Transfuse pRBCs to maintain Hb >7 g/dL, platelets >64 or if clinically indicated- Filgrastim 62mcg/kg starting on day +5 until stable engraftment of neutrophils. ID: Prophylactic antimicrobials: Acyclovir 400 mg PO TID (home medications), Ciprofloxacin 500 mg po twice daily, Isavuconazole 372 mg po daily (prior authorization pending, voriconazole allergy-gingival hyperplasia), Pentamidine 300 mg inhalation monthly- CMV -/pending, attempt to get letermovir for CMV prophylaxis. Will send script on Day +7 to determine insurance coverage before starting inpatient.Check CMV PCR weekly.- If febrile, panculture, UA and CXR,  start broad spectrum empiric antibiotics if neutropenic or septic appearing.   - Repeat blood cx every 24 hours with temperature > 100.4 or septic appearing GVHD Prophylaxis:  - Tacrolimus  3mg  PO starting day +5.  Goal of 6-11ng/ml- PT cyclophosphamide 25 mg/kg IV over 1.5 hrs every 24hr for 2 doses on D +3 & D +4 with mesna daily on Day +3 and Day +4  (PTCy dose reduced by 50% )- Monitor levels, haptoglobin, LDH, triglycerides.- Daily skin exam, liver function tests twice weekly (minimum), document stool output/day. Fluids, Electrolytes and Nutrition:- Regular diet, consider nutrition consult if needed- Daily weights- Strict Intake and output- Replete electrolytes PRNEmesis prophylaxis: Ondansetron 16 mg PO every 12 hours w/ PRN Compazine, Dexamethasone 12mg  po premed prior to melphalan High dose cyclophosphamide: doxepin premed 25 mg and diphenhydramine 25 mg 30 mins before Cytoxan; D5W 1/2NS + KCL 20 meq + Magnesium 1 gm alternating with D5W + of NaCHO3 + KCL 20 meq until 24 hours after last Cyclophosphamide dose (hold fluid during Cytoxan infusion) VOD: - Continue Ursodiol 500mg  PO BID, continue until Day +90- Daily weights, strict fluid monitoring of all intake/output- Monitor liver function tests and coags at least twice weekly HTNPatient reports not taking his antiHTN meds for the last few days due to feeling weak. - Will hold home antihypertensive meds for now - monitor BP and reorder as appropriate  CVAD: [facility] for chemotherapy and supportive care Discharge Planning: Pending count recovery 24 hour caregiver: Elenore Rota, [id number] [phone] or [facility]ile-Dustin Burrill

## 2022-09-20 NOTE — Plan of Care
Problem: Adult Inpatient Plan of CareGoal: Optimal Comfort and WellbeingOutcome: Interventions implemented as appropriate Problem: Fall Injury RiskGoal: Absence of Fall and Fall-Related InjuryOutcome: Interventions implemented as appropriate Plan of Care Overview/ Patient Status    19:00-07:00a: Douglas [name] is day -4 of Allo SCT, scheduled for 09/24/22. He A&Ox4. VSS. Afebrile. No s/s of respiratory distress. No c/o pain. Denies N/V/D this shift. RDL Hickman flushes w/o difficulty, +BR, dressing CDI. No repletions or transfusions done during this shift. Skin CDI. Voiding spontaneously. OOB independently.Douglas [name] is able to turn and reposition in bed every 2 hours. Neutropenic/Safety precautions set and maintained. Care plan maintained throughout shift.

## 2022-09-20 NOTE — Plan of Care
Problem: Adult Inpatient Plan of CareGoal: Plan of Care ReviewOutcome: Interventions implemented as appropriateGoal: Patient-Specific Goal (Individualized)Outcome: Interventions implemented as appropriateGoal: Absence of Hospital-Acquired Illness or InjuryOutcome: Interventions implemented as appropriateGoal: Optimal Comfort and WellbeingOutcome: Interventions implemented as appropriateGoal: Readiness for Transition of CareOutcome: Interventions implemented as appropriate Plan of Care Overview/ Patient Status    0700-1900 day - 4 of an Allo SCT conditioned with Melphalan + Fludarabine (PtCy) Pt received 1 bag of RBCs today for an AM H/H of 19.70/6.7. Pt tolerated the transfusion well, no pre meds given, no s/sxs of rxn noted. Pt expressed that his appetite is decreasing and he feels more fatigued today but is still able to perform his ADLs. VS are stable, he is afebrile, call bell/personal items w/in reach, hourly rounding initiated, protective precautions maintained.

## 2022-09-20 NOTE — Plan of Care
Problem: Adult Inpatient Plan of CareGoal: Plan of Care ReviewOutcome: Interventions implemented as appropriateGoal: Patient-Specific Goal (Individualized)Outcome: Interventions implemented as appropriateGoal: Absence of Hospital-Acquired Illness or InjuryOutcome: Interventions implemented as appropriateGoal: Optimal Comfort and WellbeingOutcome: Interventions implemented as appropriateGoal: Readiness for Transition of CareOutcome: Interventions implemented as appropriate Plan of Care Overview/ Patient Status    0700-1900 Day - 5 of an Allo SCT conditioned with melphalan + Fludarabine (PtCy). Received pt up in chair offeingno complaints and in no apparent distress. Pt received 92 mg of Fludarabine in 100 mls of D5W at 200 mls/hr via a right chest dbl Hickman with + blood return noted. Pt tolerated in infusion w/o issue. He remains independent with ADLs, VS are stable and he is afebrile. Protective precautions maintained, hourly rounding initiated, call bell/personal items w/in reach.

## 2022-09-21 LAB — CBC WITH AUTO DIFFERENTIAL
BKR WAM ABSOLUTE IMMATURE GRANULOCYTES.: >60 mL/min/{1.73_m2} (ref >=60)
BKR WAM ABSOLUTE LYMPHOCYTE COUNT.: 23 U/L (ref 10–35)
BKR WAM ABSOLUTE NRBC (2 DEC): 0 x 1000/ÂµL (ref 0.00–1.00)
BKR WAM ANC (ABSOLUTE NEUTROPHIL COUNT): 0.02 x 1000/ÂµL — ABNORMAL LOW (ref 2.00–7.60)
BKR WAM BASOPHIL ABSOLUTE COUNT.: 1
BKR WAM BASOPHILS: 0.6 mg/dL (ref <=1.2)
BKR WAM EOSINOPHIL ABSOLUTE COUNT.: 1 (ref 1.0–2.2)
BKR WAM EOSINOPHILS: 3.2 g/dL — ABNORMAL LOW (ref 3.6–5.1)
BKR WAM HEMATOCRIT (2 DEC): 21.4 % — ABNORMAL LOW (ref 38.50–50.00)
BKR WAM HEMOGLOBIN: 7.4 g/dL — ABNORMAL LOW (ref 13.2–17.1)
BKR WAM MCH (PG): 28.7 pg (ref 27.0–33.0)
BKR WAM MCHC: 34.6 g/dL (ref 31.0–36.0)
BKR WAM MCV: 82.9 fL (ref 80.0–100.0)
BKR WAM MONOCYTES: 6.4 g/dL (ref 5.9–8.3)
BKR WAM NEUTROPHILS: 8.7 mg/dL — ABNORMAL LOW (ref 8.8–10.2)
BKR WAM NUCLEATED RED BLOOD CELLS: 0 % (ref 0.0–1.0)
BKR WAM PLATELETS: 9 x1000/ÂµL — CL (ref 150–420)
BKR WAM RDW-CV: 14.5 % (ref 11.0–15.0)
BKR WAM RED BLOOD CELL COUNT.: 2.58 M/ÂµL — ABNORMAL LOW (ref 4.00–6.00)
BKR WAM WHITE BLOOD CELL COUNT: <0.1 x1000/ÂµL — ABNORMAL LOW (ref 4.0–11.0)

## 2022-09-21 LAB — COMPREHENSIVE METABOLIC PANEL
BKR A/G RATIO: 1 (ref 1.0–2.2)
BKR ALANINE AMINOTRANSFERASE (ALT): 22 U/L (ref 9–59)
BKR ALBUMIN: 3.2 g/dL — ABNORMAL LOW (ref 3.6–5.1)
BKR ALKALINE PHOSPHATASE: 81 U/L (ref 9–122)
BKR ANION GAP: 12 (ref 7–17)
BKR ASPARTATE AMINOTRANSFERASE (AST): 23 U/L (ref 10–35)
BKR AST/ALT RATIO: 1
BKR BILIRUBIN TOTAL: 0.6 mg/dL (ref <=1.2)
BKR BLOOD UREA NITROGEN: 11 mg/dL — CL (ref 8–23)
BKR BUN / CREAT RATIO: 12.6 (ref 8.0–23.0)
BKR CALCIUM: 8.7 mg/dL — ABNORMAL LOW (ref 8.8–10.2)
BKR CHLORIDE: 105 mmol/L (ref 98–107)
BKR CO2: 22 mmol/L (ref 20–30)
BKR CREATININE: 0.87 mg/dL (ref 0.40–1.30)
BKR EGFR, CREATININE (CKD-EPI 2021): >60 mL/min/{1.73_m2} (ref >=60)
BKR GLOBULIN: 3.2 g/dL (ref 2.0–3.9)
BKR GLUCOSE: 94 mg/dL (ref 70–100)
BKR POTASSIUM: 4 mmol/L — ABNORMAL LOW (ref 3.3–5.3)
BKR PROTEIN TOTAL: 6.4 g/dL (ref 5.9–8.3)
BKR SODIUM: 139 mmol/L — ABNORMAL LOW (ref 136–144)

## 2022-09-21 LAB — IMMATURE PLATELET FRACTION (BH GH LMW YH)
BKR WAM IPF, ABSOLUTE: 0.1 x1000/ÂµL (ref <20.0)
BKR WAM IPF: 1.6 % (ref 1.2–8.6)

## 2022-09-21 LAB — MAGNESIUM: BKR MAGNESIUM: 2.1 mg/dL (ref 1.7–2.4)

## 2022-09-21 LAB — GLUCOSE 6 PHOSPHATE DEHYDROGENASE: G6PD ENZYME ACTIVITY, B: 10.5 U/g{Hb} — ABNORMAL HIGH (ref 8.0–11.9)

## 2022-09-21 NOTE — Progress Notes
BMT ATTENDING NOTEDIAGNOSIS: DAEDRIC BONWELL is 72 y.o. 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 [name] feels well. No overnight events. No nausea. Last Bm 2 days ago. Allergies: No Known AllergiesMedications: Scheduled Meds:Current Facility-Administered Medications Medication Dose Route Frequency Provider Last Rate Last Admin  [START ON 09/24/2022] acetaminophen (TYLENOL) tablet 650 mg  650 mg Oral Once Chrystine Oiler, MD      acyclovir (ZOVIRAX) capsule 400 mg  400 mg Oral Q8H Chrystine Oiler, MD   400 mg at 09/21/22 0540  chlorhexidine gluconate (PERIDEX) 0.12 % solution 15 mL  15 mL Mouth/Throat BID Roetta Sessions, MD   15 mL at 09/21/22 0853  ciprofloxacin HCl (CIPRO) tablet 500 mg  500 mg Oral Q12H Chrystine Oiler, MD   500 mg at 09/21/22 0853  [START ON 09/27/2022] cycloPHOSphamide (CYTOXAN) 2,640 mg in sodium chloride 0.9% 500 mL infusion  25 mg/kg (Treatment Plan Recorded) Intravenous Q24H Chrystine Oiler, MD      Melene Muller ON 09/27/2022] diphenhydrAMINE (BENADRYL) injection 25 mg  25 mg IV Push Q24H Chrystine Oiler, MD      Melene Muller ON 09/24/2022] diphenhydrAMINE (BENADRYL) injection 25 mg  25 mg IV Push Once Chrystine Oiler, MD      Melene Muller ON 09/27/2022] doxepin (SINEquan) capsule 25 mg  25 mg Oral Q24H Chrystine Oiler, MD      Melene Muller ON 09/29/2022] filgrastim-sndz (ZARXIO) syringe 480 mcg  5 mcg/kg (Treatment Plan Recorded) Subcutaneous Daily (1700) Chrystine Oiler, MD      finasteride (PROSCAR) tablet 5 mg  5 mg Oral Daily Roetta Sessions, MD   5 mg at 09/21/22 0853  FLUDARabine (FLUDARA) 92 mg in dextrose 5 % in water (D5W) 100 mL infusion  40 mg/m2 (Treatment Plan Recorded) Intravenous Q24H Chrystine Oiler, MD Stopped at 09/20/22 1552  isavuconazonium (Cresemba) capsule Cap 372 mg  372 mg Oral Daily Chrystine Oiler, MD   372 mg at 09/21/22 0853  latanoprost (XALATAN) 0.005 % ophthalmic solution 1 drop  1 drop Both Eyes Nightly Roetta Sessions, MD   1 drop at 09/20/22 2122  [START ON 09/27/2022] mesna (MESNEX) 2,650 mg in sodium chloride 0.9% 500 mL infusion  25 mg/kg (Treatment Plan Recorded) Intravenous Q24H Chrystine Oiler, MD      ondansetron (ZOFRAN-ODT) disintegrating tablet 16 mg  16 mg Translingual Q12H Chrystine Oiler, MD   16 mg at 09/21/22 0540  sodium chloride 0.9 % flush 3 mL  3 mL IV Push Q8H Roetta Sessions, MD      Melene Muller ON 09/29/2022] tacrolimus (PROGRAF) immediate release capsule 3 mg  3 mg Oral Q12H (0600-1800) Chrystine Oiler, MD      ursodioL (URSO 250) tablet 500 mg  500 mg Oral BID Chrystine Oiler, MD   500 mg at 09/21/22 0853 Continuous Infusions: [START ON 09/26/2022] D5 1/2 NS 1,000 mL with potassium chloride 20 mEq, magnesium sulfate 1 g infusion    [START ON 09/27/2022] potassium chloride 20 mEq, sodium bicarbonate 50 mEq in D5 1/2 NS 1,000 mL infusion   PRN Meds:.[START ON 09/24/2022] albuterol, [START ON 09/24/2022] diphenhydrAMINE, [START ON 09/24/2022] EPINEPHrine, [START ON 09/24/2022] EPINEPHrine, [START ON 09/24/2022] famotidine (PEPCID) IV (All ages), [START ON 09/24/2022] hydrocortisone IV orderable (all ages), LORazepam, prochlorperazine edisylate, [START ON 09/24/2022] sodium chloride 0.9 % (new bag), sodium chloride,  sodium chloride, sodium chloride   PHYSICAL EXAM:BP 129/64  - Pulse 73  - Temp 97.8 ?F (36.6 ?C) (Oral)  - Resp 18  - Wt 102.9 kg  - SpO2 97%  - BMI 31.63 kg/m? General Appearance:He is heavy set, appears wellHEENT:  Mucous membranes moist, No oral lesions, no erythemaLungs: ClearCor: RRR, no m/r/gAbd: soft nontender, no hepatosplenomegaly, Extr: trace to 1+ edema, petechiae on the feet and toes. Skin: Some actinic changesNeurologic: mentation appears normal, normal speech, strength symmetric.   LABSLab Results Component Value Date/Time  WBC <0.1 (L) 09/21/2022 05:38 AM  HGB 7.4 (L) 09/21/2022 05:38 AM  HCT 21.40 (L) 09/21/2022 05:38 AM  PLT 9 (LL) 09/21/2022 05:38 AM  Lab Results Component Value Date/Time  NEUTROPHILS  09/21/2022 05:38 AM    Comment:    Diff Not Performed  Lab Results Component Value Date/Time  NA 139 09/21/2022 05:38 AM  K 4.0 09/21/2022 05:38 AM  CL 105 09/21/2022 05:38 AM  CO2 22 09/21/2022 05:38 AM  BUN 11 09/21/2022 05:38 AM  CREATININE 0.87 09/21/2022 05:38 AM  GLU 94 09/21/2022 05:38 AM  ANIONGAP 12 09/21/2022 05:38 AM  Lab Results Component Value Date/Time  CALCIUM 8.7 (L) 09/21/2022 05:38 AM  MG 2.1 09/21/2022 05:38 AM  PHOS 2.2 09/06/2022 10:00 AM  Lab Results Component Value Date/Time  ALT 22 09/21/2022 05:38 AM  AST 23 09/21/2022 05:38 AM  ALKPHOS 81 09/21/2022 05:38 AM  BILITOT 0.6 09/21/2022 05:38 AM  BILIDIR 0.2 09/11/2022 12:31 PM  ALBUMIN 3.2 (L) 09/21/2022 05:38 AM  Lab Results Component Value Date/Time  PTT 33.7 (H) 09/18/2022 04:32 PM  LABPROT 12.6 (H) 09/18/2022 04:32 PM  INR 1.15 (H) 09/18/2022 04:32 PM  Results in Past 7 DaysResult Component Current Result LD 236 (09/18/2022)  IMPRESSION/PLAN: TIELER MARKHAM is 72 y.o. 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  His disease was initially characterized by minor cytopenias over a long period of observation with recently worsened anemia, thrombocytopenia, and a marrow with increased blasts consistent with disease progression with mutations in mutations in TET2, [name], and ASXL1. He has been treated with decitabine and venetoclax with reduction in blast count. The patient has had a formal  search of the registry and a 10/10 match was identified. He is now admitted for reduced intensity conditioning with melphalan and fludarabine. A Mel dose of 70mg /m2 is being employed due to age and the dose of PTCy has been modified to 25mg /kg x 2 given age and fully matched MUD.    CONDITIONING:Melphalan 70 mg/m2 over 30 minutes on D-6Fludarabine 40 mg/m2 over 30 minutes every 24hr for 4 doses from D-5 to D -2High dose cyclophosphamide 25 mg/kg IV over 1.5 hrs every 24hr for 2 doses on D +3 & D +4. Doxepin premed 25 mg and diphenhydramine 25 mg 30 mins before Cytoxan; D5W 1/2NS + KCL 20 meq + Magnesium 1 gm alternating with D5W + of NaCHO3 + KCL 20 meq until 24 hours after last Cyclophosphamide dose (hold fluid during Cytoxan infusion) Mesna 25 mg/kg CIVI daily, for 2 doses, starting 15 minutes before cyclophosphamide on D +3 & D +4Cells: Fresh cells, no LR  ID: Prophylaxis with Acyclovir 400 mg PO TID, Ciprofloxacin 500 mg po twice daily, Isavuconazole 372 mg po daily, Pentamidine 300 mg inhalation monthly. If develops neutropenic fever will start Zosyn/Vancomycin.  Check DFA with PCRCMV: R-/D Toxoplasmosis: R-/D, Varicella:  R+/D HEME: Pancytopenia at baseline. Transfuse to keep Hgb >7 and platelets >10. If GI bleeding re-occurs, will adjust  goals. Platelet transfusion today. 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 Tacrolimus CYP3A5 intermediate metabolizer (*1/*3); CYP3A4 normal metabolizer (*1/*1) - Tacrolimus dosing will be based CPIC guideline 0.02 mg/kg/day IV converted to PO. VOD: trend, LFTs, coags, weights post transplant. Ursodiol 500 mg twice daily  SUPPORTIVE CARE: Ondansetron 16 mg PO every 12 hours w/ PRN Compazine, Dexamethasone 12mg  po premed prior to melphalan  Sheryle Hail, MD

## 2022-09-21 NOTE — Plan of Care
Problem: Adult Inpatient Plan of CareGoal: Optimal Comfort and WellbeingOutcome: Interventions implemented as appropriate Plan of Care Overview/ Patient Status    19:00-07:00a: Douglas [name] is day -3 of Allo SCT, scheduled for 09/24/22. He A&Ox4. VSS. Afebrile. No s/s of respiratory distress. No c/o pain. Denies N/V/D this shift. RDL [facility]ickman flushes w/o difficulty, +BR, dressing CDI. No repletions or transfusions done during this shift. Skin CDI. Voiding spontaneously. OOB independently.Douglas [name] is able to turn and reposition in bed every 2 hours. Neutropenic/Safety precautions set and maintained. Care plan maintained throughout shift.

## 2022-09-21 NOTE — Progress Notes
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 No cough or shortness of breath. No nausea or vomiting. No appetite loss. Reports no BM since yesterday. Voiding without difficulty. No rash, fever or chills. Review of Systems Constitutional: Negative.  Negative for chills, fever and malaise/fatigue. HENT: Negative.  Negative for sore throat.  Eyes: Negative.  Respiratory: Negative.  Negative for cough, sputum production and shortness of breath.  Cardiovascular: Negative.  Negative for chest pain, palpitations and leg swelling. Gastrointestinal: Negative.  Negative for abdominal pain, constipation, diarrhea, nausea and vomiting. Genitourinary:  Negative for dysuria, frequency and urgency. Musculoskeletal: Negative.  Skin: Negative.  Negative for itching and rash.      Toes discolored - not new Neurological: Negative.  Psychiatric/Behavioral: Negative.   Review of Allergies/Meds/Hx: Allergies Allergen Reactions  Voriconazole Other (See Comments)   Gingival edema/redness making it difficult to bite down  Medications:Current Facility-Administered Medications:   [START ON 09/24/2022] acetaminophen (TYLENOL) tablet 650 mg, 650 mg, Oral, Once, Chrystine Oiler, MD  acyclovir (ZOVIRAX) capsule 400 mg, 400 mg, Oral, Q8H, Chrystine Oiler, MD, 400 mg at 09/21/22 0540  [START ON 09/24/2022] albuterol neb sol 2.5 mg/3 mL (0.083%) (PROVENTIL,VENTOLIN), 2.5 mg, Nebulization, Q30 MIN PRN, Chrystine Oiler, MD  chlorhexidine gluconate (PERIDEX) 0.12 % solution 15 mL, 15 mL, Mouth/Throat, BID, Carolyne Littles, Sidra Oldfield, MD, 15 mL at 09/20/22 2123  ciprofloxacin HCl (CIPRO) tablet 500 mg, 500 mg, Oral, Q12H, Chrystine Oiler, MD, 500 mg at 09/20/22 2120  [START ON 09/27/2022] cycloPHOSphamide (CYTOXAN) 2,640 mg in sodium chloride 0.9% 500 mL infusion, 25 mg/kg (Treatment Plan Recorded), Intravenous, Q24H, Chrystine Oiler, MD  [START ON 09/26/2022] D5 1/2 NS 1,000 mL with potassium chloride 20 mEq, magnesium sulfate 1 g infusion, 150 mL/hr, Intravenous, Continuous, Chrystine Oiler, MD  [START ON 09/27/2022] diphenhydrAMINE (BENADRYL) injection 25 mg, 25 mg, IV Push, Q24H, Chrystine Oiler, MD  [START ON 09/24/2022] diphenhydrAMINE (BENADRYL) injection 25 mg, 25 mg, IV Push, Once, Chrystine Oiler, MD  Melene Muller ON 09/24/2022] diphenhydrAMINE (BENADRYL) injection 50 mg, 50 mg, IV Push, Once PRN, Chrystine Oiler, MD  Melene Muller ON 09/27/2022] doxepin (SINEquan) capsule 25 mg, 25 mg, Oral, Q24H, Chrystine Oiler, MD  Melene Muller ON 09/24/2022] EPINEPHrine (EPI-PEN) auto-injector 0.3 mg, 0.3 mg, Intramuscular, Q15 MIN PRN, Chrystine Oiler, MD  Melene Muller ON 09/24/2022] EPINEPHrine (EPI-PEN) auto-injector 0.3 mg, 0.3 mg, Intramuscular, Q15 MIN PRN, Chrystine Oiler, MD  Melene Muller ON 09/24/2022] famotidine (PF) (PEPCID) 20 mg in sodium chloride 0.9% PF 5 mL Injection, 20 mg, IV Push, Once PRN, Chrystine Oiler, MD  Melene Muller ON 09/29/2022] filgrastim-sndz (ZARXIO) syringe 480 mcg, 5 mcg/kg (Treatment Plan Recorded), Subcutaneous, Daily (1700), Chrystine Oiler, MD  finasteride (PROSCAR) tablet 5 mg, 5 mg, Oral, Daily, Al-Musa, Jayshawn Colston, MD, 5 mg at 09/20/22 0820  FLUDARabine (FLUDARA) 92 mg in dextrose 5 % in water (D5W) 100 mL infusion, 40 mg/m2 (Treatment Plan Recorded), Intravenous, Q24H, Chrystine Oiler, MD, Stopped at 09/20/22 1552  [START ON 09/24/2022] hydrocortisone sodium succinate (PF) (Solu-CORTEF) injection 100 mg, 100 mg, IV Push, Once PRN, Chrystine Oiler, MD  isavuconazonium (Cresemba) capsule Cap 372 mg, 372 mg,  Oral, Daily, Chrystine Oiler, MD, 372 mg at 09/20/22 0820  latanoprost (XALATAN) 0.005 % ophthalmic solution 1 drop, 1 drop, Both Eyes, Nightly, Al-Musa, Ellizabeth Dacruz, MD, 1 drop at 09/20/22 2122  LORazepam (ATIVAN) tablet 0.5 mg, 0.5 mg, Oral, Q6H PRN, Chrystine Oiler, MD  Melene Muller ON 09/27/2022] mesna (MESNEX) 2,650 mg in sodium chloride 0.9% 500 mL infusion, 25 mg/kg (Treatment Plan Recorded), Intravenous, Q24H, Chrystine Oiler, MD  ondansetron (ZOFRAN-ODT) disintegrating tablet 16 mg, 16 mg, Translingual, Q12H, Chrystine Oiler, MD, 16 mg at 09/21/22 0540  [START ON 09/27/2022] potassium chloride 20 mEq, sodium bicarbonate 50 mEq in D5 1/2 NS 1,000 mL infusion, 150 mL/hr, Intravenous, Continuous, Chrystine Oiler, MD  prochlorperazine edisylate (COMPAZINE) injection 10 mg, 10 mg, IV Push, Q6H PRN, Chrystine Oiler, MD  Melene Muller ON 09/24/2022] sodium chloride 0.9 % (new bag) bolus 500 mL, 500 mL, Intravenous, Once PRN, Chrystine Oiler, MD  sodium chloride 0.9 % flush 10 mL, 10 mL, IV Push, PRN for Line Care, Chrystine Oiler, MD  sodium chloride 0.9 % flush 3 mL, 3 mL, IV Push, Q8H, Al-Musa, Nhu Glasby, MD  sodium chloride 0.9 % flush 3 mL, 3 mL, IV Push, PRN for Line Care, Roetta Sessions, MD  sodium chloride 0.9% infusion, 5 mL/hr, Intravenous, PRN, Chrystine Oiler, MD  Melene Muller ON 09/29/2022] tacrolimus (PROGRAF) immediate release capsule 3 mg, 3 mg, Oral, Q12H (0600-1800), Chrystine Oiler, MD  ursodioL (URSO 250) tablet 500 mg, 500 mg, Oral, BID, Chrystine Oiler, MD, 500 mg at 09/20/22 2121Objective: Temp:  [97.6 ?F (36.4 ?C)-98.6 ?F (37 ?C)] 98.6 ?F (37 ?C)Pulse:  [64-77] 65Resp:  [18] 18BP: (118-138)/(64-73) 134/70SpO2:  [97 %-99 %] 97 %I/O's:Gross Totals (Last 24 hours) at 09/21/2022 0757Last data filed at 09/20/2022 2000Intake 925.78 ml Output -- Net 925.78 ml Wt: 09/18/22 102.9 kg 09/16/22 104.2 kg 09/13/22 104.3 kg 09/11/22 105.9 kg 09/09/22 105.4 kg 09/04/22 106.4 kg Physical ExamConstitutional:     Appearance: Normal appearance. He is normal weight. He is not ill-appearing or diaphoretic. HENT:    Head: Normocephalic.    Nose: Nose normal.    Mouth/Throat:    Mouth: Mucous membranes are moist.    Pharynx: Oropharynx is clear. No oropharyngeal exudate. Eyes:    General: No scleral icterus.      Right eye: No discharge.       Left eye: No discharge. Cardiovascular:    Rate and Rhythm: Normal rate and regular rhythm.    Heart sounds: Normal heart sounds. No murmur heard.Pulmonary:    Effort: Pulmonary effort is normal. No respiratory distress.    Breath sounds: Normal breath sounds. No wheezing or rales. Abdominal:    General: Abdomen is flat. Bowel sounds are normal. There is no distension.    Palpations: Abdomen is soft.    Tenderness: There is no abdominal tenderness. There is no guarding. Musculoskeletal:       General: No swelling or deformity. Normal range of motion.    Right lower leg: No edema.    Left lower leg: No edema. Skin:   General: Skin is warm.    Findings: No rash.    Comments: Toes hyperpigmetned/?PVD - not newBruises, petechiae scattered Neurological:    Mental Status: He is alert and oriented to person, place, and time. Psychiatric:       Mood and Affect: Mood normal. Labs:Recent Results (from the past 24 hour(s)) Magnesium  Collection Time: 09/21/22  5:38 AM Result Value Ref Range  Magnesium 2.1 1.7 - 2.4 mg/dL CBC auto differential  Collection Time:  09/21/22  5:38 AM Result Value Ref Range  WBC <0.1 (L) 4.0 - 11.0 x1000/?L  RBC 2.58 (L) 4.00 - 6.00 M/?L  Hemoglobin 7.4 (L) 13.2 - 17.1 g/dL  Hematocrit 60.45 (L) 40.98 - 50.00 %  MCV 82.9 80.0 - 100.0 fL  MCH 28.7 27.0 - 33.0 pg  MCHC 34.6 31.0 - 36.0 g/dL  RDW-CV 11.9 14.7 - 82.9 %  Platelets 9 (LL) 150 - 420 x1000/?L  MPV    Neutrophils    Lymphocytes    Monocytes    Eosinophils    Basophil    Immature Granulocytes    nRBC 0.0 0.0 - 1.0 %  Absolute Lymphocyte Count    Monocyte Absolute Count    Eosinophil Absolute Count    Basophil Absolute Count    Absolute Immature Granulocyte Count    Absolute nRBC 0.00 0.00 - 1.00 x 1000/?L  ANC (Abs Neutrophil Count) 0.02 (L) 2.00 - 7.60 x 1000/?L Comprehensive metabolic panel  Collection Time: 09/21/22  5:38 AM Result Value Ref Range  Sodium 139 136 - 144 mmol/L  Potassium 4.0 3.3 - 5.3 mmol/L  Chloride 105 98 - 107 mmol/L  CO2 22 20 - 30 mmol/L  Anion Gap 12 7 - 17  Glucose 94 70 - 100 mg/dL  BUN 11 8 - 23 mg/dL  Creatinine 5.62 1.30 - 1.30 mg/dL  Calcium 8.7 (L) 8.8 - 10.2 mg/dL  BUN/Creatinine Ratio 86.5 8.0 - 23.0  Total Protein 6.4 5.9 - 8.3 g/dL  Albumin 3.2 (L) 3.6 - 5.1 g/dL  Total Bilirubin 0.6 <=7.8 mg/dL  Alkaline Phosphatase 81 9 - 122 U/L  Alanine Aminotransferase (ALT) 22 9 - 59 U/L  Aspartate Aminotransferase (AST) 23 10 - 35 U/L  Globulin 3.2 2.0 - 3.9 g/dL  A/G Ratio 1.0 1.0 - 2.2  AST/ALT Ratio 1.0 Reference Range Not Established  eGFR (Creatinine) >60 >=60 mL/min/1.5m2 Immature Platelet Fraction (BH GH LMW YH)  Collection Time: 09/21/22  5:38 AM Result Value Ref Range  Immature Platelet Fraction 1.6 1.2 - 8.6 %  Absolute Immature Platelet Fraction 0.1 <20.0 x1000/?L Diagnostics:No results found.Assessment: SAAHIL KUREK is a 72 y.o. 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 He is s/p 2 cycles of chemotherapy with Decitabine + Venetoclax with reduction in blast count. 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  Plan:  Day -3:  will transfuse platelets for plt count of 9 Conditioning/Transplant: CMML -Melphalan 70 mg/m2 over 30 minutes on D-6, *Melphalan dosed reduced to 70 mg/m2 due to age-Fludarabine 40 mg/m2 over 30 minutes every 24hr for 4 doses from D-5 to D -2             -PT cyclophosphamide 25 mg/kg IV over 1.5 hrs every 24hr for 2 doses on D +3 & D +4 (reduced by 50% to 25 mg/kg due 10/10 match and age)-Mesna 50 mg/kg CIVI daily, for 2 doses, starting 15 minutes before cyclophosphamide on D +3 & D +4-Stem cell infusion day 0 (09/24/22) HEME: - Transfuse pRBCs to maintain Hb >7 g/dL, platelets >46 or if clinically indicated- Filgrastim 50mcg/kg starting on day +5 until stable engraftment of neutrophils. ID: Prophylactic antimicrobials: Acyclovir 400 mg PO TID (home medications), Ciprofloxacin 500 mg po twice daily, Isavuconazole 372 mg po daily (prior authorization pending, voriconazole allergy-gingival hyperplasia), Pentamidine 300 mg inhalation monthly- CMV -/pending, attempt to  get letermovir for CMV prophylaxis. Will send script on Day +7 to determine insurance coverage before starting inpatient.Check CMV PCR weekly.- If febrile, panculture, UA and CXR, start broad spectrum empiric antibiotics if neutropenic or septic appearing.   - Repeat blood cx every 24 hours with temperature > 100.4 or septic appearing GVHD Prophylaxis:  - Tacrolimus  3mg  PO starting day +5.  Goal of 6-11ng/ml- PT cyclophosphamide 25 mg/kg IV over 1.5 hrs every 24hr for 2 doses on D +3 & D +4 with mesna daily on Day +3 and Day +4  (PTCy dose reduced by 50% )- Monitor levels, haptoglobin, LDH, triglycerides.- Daily skin exam, liver function tests twice weekly (minimum), document stool output/day. Fluids, Electrolytes and Nutrition:- Regular diet, consider nutrition consult if needed- Daily weights- Strict Intake and output- Replete electrolytes PRNEmesis prophylaxis: Ondansetron 16 mg PO every 12 hours w/ PRN Compazine, Dexamethasone 12mg  po premed prior to melphalan High dose cyclophosphamide: doxepin premed 25 mg and diphenhydramine 25 mg 30 mins before Cytoxan; D5W 1/2NS + KCL 20 meq + Magnesium 1 gm alternating with D5W + of NaCHO3 + KCL 20 meq until 24 hours after last Cyclophosphamide dose (hold fluid during Cytoxan infusion) VOD: - Continue Ursodiol 500mg  PO BID, continue until Day +90- Daily weights, strict fluid monitoring of all intake/output- Monitor liver function tests and coags at least twice weekly HTNPatient reports not taking his antiHTN meds for the last few days due to feeling weak. - Will hold home antihypertensive meds for now - monitor BP and reorder as appropriate  CVAD: Hickman for chemotherapy and supportive care Discharge Planning: Pending count recovery 24 hour caregiver: Signed:Bethene Hankinson Al-Musa, MDPGY-2, Internal MedicineAvailable via MHB8/06/2022

## 2022-09-21 NOTE — Plan of Care
Plan of Care Overview/ Patient Status    0700-1900Day -3 awaiting allo SCT. Received pt A&OX4, VSS, afebrile. No complaints of pain. No nausea, no vomiting, no diarrhea. Pt has not had bowel movement since 8/3 denies need for laxative at this time. Plan to reassess with pt tomorrow. Encouraged prune juice and increase in water consumption. Appetite is fair, PO intake is fair. Pt ate all three meals today. Received Fludarabine per MAR through R DL [facility]ICK which flushes and has brisk BR. Two RN verification completed. Plts 9 today, 1 bag of pooled platelets administered. No pre-medications given, pt tolerated well no transfusion reactions assessed. Ambulates OOB independently with a steady gait, no bed alarm necessary- resting comfortably at this time, no further orders. Problem: Adult Inpatient Plan of CareGoal: Optimal Comfort and WellbeingOutcome: Interventions implemented as appropriate

## 2022-09-22 LAB — CBC WITH AUTO DIFFERENTIAL
BKR WAM ABSOLUTE NRBC (2 DEC): 0 x 1000/ÂµL (ref 0.00–1.00)
BKR WAM ANC (ABSOLUTE NEUTROPHIL COUNT): 0.01 x 1000/ÂµL — ABNORMAL LOW (ref 2.00–7.60)
BKR WAM BASOPHIL ABSOLUTE COUNT.: 7.5 g/dL (ref 5.9–8.3)
BKR WAM HEMATOCRIT (2 DEC): 22.6 % — ABNORMAL LOW (ref 38.50–50.00)
BKR WAM HEMOGLOBIN: 7.7 g/dL — ABNORMAL LOW (ref 13.2–17.1)
BKR WAM IMMATURE GRANULOCYTES: 14.3 % — ABNORMAL LOW (ref 11.0–15.0)
BKR WAM MCH (PG): 28.5 pg (ref 27.0–33.0)
BKR WAM MCHC: 34.1 g/dL (ref 31.0–36.0)
BKR WAM MCV: 83.7 fL (ref 80.0–100.0)
BKR WAM NEUTROPHILS: 9.2 g/dL — ABNORMAL LOW (ref 11.7–15.5)
BKR WAM NUCLEATED RED BLOOD CELLS: 0 % (ref 0.0–1.0)
BKR WAM PLATELETS: 24 x1000/ÂµL — ABNORMAL LOW (ref 150–420)
BKR WAM RDW-CV: 14.5 % (ref 11.0–15.0)
BKR WAM RED BLOOD CELL COUNT.: 2.7 M/ÂµL — ABNORMAL LOW (ref 4.00–6.00)
BKR WAM WHITE BLOOD CELL COUNT: <0.1 x1000/ÂµL — ABNORMAL LOW (ref 4.0–11.0)

## 2022-09-22 LAB — COMPREHENSIVE METABOLIC PANEL
BKR A/G RATIO: 0.9 — ABNORMAL LOW (ref 1.0–2.2)
BKR ALANINE AMINOTRANSFERASE (ALT): 18 U/L (ref 9–59)
BKR ALBUMIN: 3.3 g/dL — ABNORMAL LOW (ref 3.6–5.1)
BKR ALKALINE PHOSPHATASE: 90 U/L (ref 9–122)
BKR ANION GAP: 11 (ref 7–17)
BKR ASPARTATE AMINOTRANSFERASE (AST): 26 U/L (ref 10–35)
BKR AST/ALT RATIO: 1.4
BKR BILIRUBIN TOTAL: 0.5 mg/dL (ref <=1.2)
BKR BLOOD UREA NITROGEN: 12 mg/dL (ref 8–23)
BKR BUN / CREAT RATIO: 13.3 (ref 8.0–23.0)
BKR CALCIUM: 9.1 mg/dL (ref 8.8–10.2)
BKR CHLORIDE: 104 mmol/L (ref 98–107)
BKR CO2: 22 mmol/L (ref 20–30)
BKR CREATININE: 0.9 mg/dL (ref 0.40–1.30)
BKR EGFR, CREATININE (CKD-EPI 2021): >60 mL/min/{1.73_m2} (ref >=60)
BKR GLOBULIN: 3.5 g/dL (ref 2.0–3.9)
BKR GLUCOSE: 101 mg/dL — ABNORMAL HIGH (ref 70–100)
BKR POTASSIUM: 4.1 mmol/L (ref 3.3–5.3)
BKR PROTEIN TOTAL: 6.8 g/dL (ref 5.9–8.3)
BKR SODIUM: 137 mmol/L (ref 136–144)

## 2022-09-22 LAB — IMMATURE PLATELET FRACTION (BH GH LMW YH)
BKR WAM IPF, ABSOLUTE: 0.3 x1000/ÂµL — ABNORMAL LOW (ref 38.50–<20.0)
BKR WAM IPF: 1.1 % — ABNORMAL LOW (ref 1.2–8.6)

## 2022-09-22 LAB — CCS, HEME     (YH)

## 2022-09-22 NOTE — Plan of Care
Plan of Care Overview/ Patient Status    1900-0700Pt is A&O x4. Ind OOB. T&P q 2 hrs ind.  RDL Hickman capped. +flush, +BR, dressing c/d/i. VSS & afebrile. Satting WDL on RA.Voiding urine in BR. LBM 8/3. Regular diet w good PO intake. Skin intact, known swelling to hard palate, oral rinse provided. No c/o pain. AM Labs drawn and sent. Patient safety maintained with frequent rounding.

## 2022-09-22 NOTE — Plan of Care
Plan of Care Overview/ Patient Status    0700-1900A and O X4, VSS, independent OOB, no complain of pain/n/v/d during this shidt, RDL hickman,c/d/I with positive blood return noted, received a bag of chemo, tolerated with no adverse effect, positive blood return before and after the infusion.Bed is in the lowest position, call bell in reach, hourly rounding done.WCTM

## 2022-09-22 NOTE — Progress Notes
BMT ATTENDING NOTEDIAGNOSIS: Douglas [name] is 72 y.o. male with CMML-2 with mutations in TET2, [name], and ASXL1 and eventual progression to AML, admitted for allogeneic PBSCT CONDITIONING: Fludarabine/Melphalan with post-transplant Cyclophosphamide GRAFT SOURCE: 10/10 MUDDay 0: 09/24/22 CMV: R  neg/D ABO: R A pos/ D  HISTORY:   Day -2. [name] feels well. No overnight events. No nausea. Feels gum swelling has gone down. Allergies: No Known AllergiesMedications: Scheduled Meds:Current Facility-Administered Medications Medication Dose Route Frequency Provider Last Rate Last Admin  [START ON 09/24/2022] acetaminophen (TYLENOL) tablet 650 mg  650 mg Oral Once Chrystine Oiler, MD      acyclovir (ZOVIRAX) capsule 400 mg  400 mg Oral Q8H Chrystine Oiler, MD   400 mg at 09/22/22 0436  chlorhexidine gluconate (PERIDEX) 0.12 % solution 15 mL  15 mL Mouth/Throat BID Roetta Sessions, MD   15 mL at 09/21/22 2011  ciprofloxacin HCl (CIPRO) tablet 500 mg  500 mg Oral Q12H Chrystine Oiler, MD   500 mg at 09/21/22 2012  [START ON 09/27/2022] cycloPHOSphamide (CYTOXAN) 2,640 mg in sodium chloride 0.9% 500 mL infusion  25 mg/kg (Treatment Plan Recorded) Intravenous Q24H Chrystine Oiler, MD      Melene Muller ON 09/27/2022] diphenhydrAMINE (BENADRYL) injection 25 mg  25 mg IV Push Q24H Chrystine Oiler, MD      Melene Muller ON 09/24/2022] diphenhydrAMINE (BENADRYL) injection 25 mg  25 mg IV Push Once Chrystine Oiler, MD      Melene Muller ON 09/27/2022] doxepin (SINEquan) capsule 25 mg  25 mg Oral Q24H Chrystine Oiler, MD      Melene Muller ON 09/29/2022] filgrastim-sndz (ZARXIO) syringe 480 mcg  5 mcg/kg (Treatment Plan Recorded) Subcutaneous Daily (1700) Chrystine Oiler, MD      finasteride (PROSCAR) tablet 5 mg  5 mg Oral Daily Roetta Sessions, MD   5 mg at 09/21/22 0853  FLUDARabine (FLUDARA) 92 mg in dextrose 5 % in water (D5W) 100 mL infusion  40 mg/m2 (Treatment Plan Recorded) Intravenous Q24H Chrystine Oiler, MD 200 mL/hr at 09/21/22 1533 92 mg at 09/21/22 1533  isavuconazonium (Cresemba) capsule Cap 372 mg  372 mg Oral Daily Chrystine Oiler, MD   372 mg at 09/21/22 0853  latanoprost (XALATAN) 0.005 % ophthalmic solution 1 drop  1 drop Both Eyes Nightly Roetta Sessions, MD   1 drop at 09/21/22 2013  [START ON 09/27/2022] mesna (MESNEX) 2,650 mg in sodium chloride 0.9% 500 mL infusion  25 mg/kg (Treatment Plan Recorded) Intravenous Q24H Chrystine Oiler, MD      ondansetron (ZOFRAN-ODT) disintegrating tablet 16 mg  16 mg Translingual Q12H Chrystine Oiler, MD   16 mg at 09/21/22 0540  sodium chloride 0.9 % flush 3 mL  3 mL IV Push Q8H Roetta Sessions, MD      Melene Muller ON 09/29/2022] tacrolimus (PROGRAF) immediate release capsule 3 mg  3 mg Oral Q12H (0600-1800) Chrystine Oiler, MD      ursodioL (URSO 250) tablet 500 mg  500 mg Oral BID Chrystine Oiler, MD   500 mg at 09/21/22 2011 Continuous Infusions: [START ON 09/26/2022] D5 1/2 NS 1,000 mL with potassium chloride 20 mEq, magnesium sulfate 1 g infusion    [START ON 09/27/2022] potassium chloride 20 mEq, sodium bicarbonate 50 mEq in D5 1/2 NS 1,000 mL infusion   PRN Meds:.[START ON 09/24/2022] albuterol, [START ON 09/24/2022] diphenhydrAMINE, [START ON 09/24/2022] EPINEPHrine, [START ON 09/24/2022] EPINEPHrine, [START ON 09/24/2022] famotidine (PEPCID) IV (All ages), [START ON 09/24/2022] hydrocortisone IV orderable (all ages), LORazepam, prochlorperazine edisylate, [START ON 09/24/2022] sodium  chloride 0.9 % (new bag), sodium chloride, sodium chloride, sodium chloride   PHYSICAL EXAM:BP 131/77  - Pulse 71  - Temp 98.7 ?F (37.1 ?C) (Oral)  - Resp 19  - Wt 103.4 kg  - SpO2 97%  - BMI 31.79 kg/m? General Appearance:He is heavy set, appears wellHEENT:  Mucous membranes moist, No oral lesions, no erythemaLungs: ClearCor: RRR, no m/r/gAbd: soft nontender, no hepatosplenomegaly, Extr: trace to 1+ edema, petechiae on the feet and toes. Skin: Some actinic changesNeurologic: mentation appears normal, normal speech, strength symmetric.   LABSLab Results Component Value Date/Time  WBC <0.1 (L) 09/22/2022 04:47 AM  HGB 7.7 (L) 09/22/2022 04:47 AM  HCT 22.60 (L) 09/22/2022 04:47 AM  PLT 24 (L) 09/22/2022 04:47 AM  Lab Results Component Value Date/Time  NEUTROPHILS  09/22/2022 04:47 AM    Comment:    Diff Not Performed  Lab Results Component Value Date/Time  NA 137 09/22/2022 04:47 AM  K 4.1 09/22/2022 04:47 AM  CL 104 09/22/2022 04:47 AM  CO2 22 09/22/2022 04:47 AM  BUN 12 09/22/2022 04:47 AM  CREATININE 0.90 09/22/2022 04:47 AM  GLU 101 (H) 09/22/2022 04:47 AM  ANIONGAP 11 09/22/2022 04:47 AM  Lab Results Component Value Date/Time  CALCIUM 9.1 09/22/2022 04:47 AM  MG 2.1 09/21/2022 05:38 AM  PHOS 2.2 09/06/2022 10:00 AM  Lab Results Component Value Date/Time  ALT 18 09/22/2022 04:47 AM  AST 26 09/22/2022 04:47 AM  ALKPHOS 90 09/22/2022 04:47 AM  BILITOT 0.5 09/22/2022 04:47 AM  BILIDIR 0.2 09/11/2022 12:31 PM  ALBUMIN 3.3 (L) 09/22/2022 04:47 AM  Lab Results Component Value Date/Time  PTT 33.7 (H) 09/18/2022 04:32 PM  LABPROT 12.6 (H) 09/18/2022 04:32 PM  INR 1.15 (H) 09/18/2022 04:32 PM  Results in Past 7 DaysResult Component Current Result LD 236 (09/18/2022)  IMPRESSION/PLAN: Douglas [name] is 72 y.o. 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  His disease was initially characterized by minor cytopenias over a long period of observation with recently worsened anemia, thrombocytopenia, and a marrow with increased blasts consistent with disease progression with mutations in mutations in TET2, [name], and ASXL1. He has been treated with decitabine and venetoclax with reduction in blast count. The patient has had a formal  search of the registry and a 10/10 match was identified. He is now admitted for reduced intensity conditioning with melphalan and fludarabine. A Mel dose of 70mg /m2 is being employed due to age and the dose of PTCy has been modified to 25mg /kg x 2 given age and fully matched MUD.    CONDITIONING:Melphalan 70 mg/m2 over 30 minutes on D-6Fludarabine 40 mg/m2 over 30 minutes every 24hr for 4 doses from D-5 to D -2High dose cyclophosphamide 25 mg/kg IV over 1.5 hrs every 24hr for 2 doses on D +3 & D +4. Doxepin premed 25 mg and diphenhydramine 25 mg 30 mins before Cytoxan; D5W 1/2NS + KCL 20 meq + Magnesium 1 gm alternating with D5W + of NaCHO3 + KCL 20 meq until 24 hours after last Cyclophosphamide dose (hold fluid during Cytoxan infusion) Mesna 25 mg/kg CIVI daily, for 2 doses, starting 15 minutes before cyclophosphamide on D +3 & D +4Cells: Fresh cells, no LR  ID: Prophylaxis with Acyclovir 400 mg PO TID, Ciprofloxacin 500 mg po twice daily, Isavuconazole 372 mg po daily, Pentamidine 300 mg inhalation monthly. If develops neutropenic fever will start Zosyn/Vancomycin.  Check DFA with PCRCMV: R-/D Toxoplasmosis: R-/D, Varicella:  R+/D HEME: Pancytopenia due to chemotherapy. Transfuse to keep Hgb >7 and  platelets >10. If GI bleeding re-occurs, will adjust goals. No transfusion today. 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 Tacrolimus CYP3A5 intermediate metabolizer (*1/*3); CYP3A4 normal metabolizer (*1/*1) - Tacrolimus dosing will be based CPIC guideline 0.02 mg/kg/day IV converted to PO. VOD: trend, LFTs, coags, weights post transplant. Ursodiol 500 mg twice daily  SUPPORTIVE CARE: Ondansetron 16 mg PO every 12 hours w/ PRN Compazine, Dexamethasone 12mg  po premed prior to melphalan  Sheryle Hail, MD

## 2022-09-23 ENCOUNTER — Ambulatory Visit: Admit: 2022-09-23 | Payer: MEDICARE

## 2022-09-23 LAB — IMMATURE PLATELET FRACTION (BH GH LMW YH)
BKR WAM IPF, ABSOLUTE: 0.2 x1000/ÂµL (ref 20–<20.0)
BKR WAM IPF: 1.3 % (ref 1.2–8.6)

## 2022-09-23 LAB — COMPREHENSIVE METABOLIC PANEL
BKR A/G RATIO: 1 (ref 1.0–2.2)
BKR ALANINE AMINOTRANSFERASE (ALT): 19 U/L (ref 9–59)
BKR ALBUMIN: 3.2 g/dL — ABNORMAL LOW (ref 3.6–5.1)
BKR ALKALINE PHOSPHATASE: 90 U/L (ref 9–122)
BKR ANION GAP: 10 g/dL (ref 7–17)
BKR ASPARTATE AMINOTRANSFERASE (AST): 21 U/L (ref 10–35)
BKR AST/ALT RATIO: 1.1
BKR BILIRUBIN TOTAL: 0.5 mg/dL (ref <=1.2)
BKR BLOOD UREA NITROGEN: 10 mg/dL — ABNORMAL LOW (ref 8–23)
BKR BUN / CREAT RATIO: 13 (ref 8.0–23.0)
BKR CALCIUM: 8.8 mg/dL (ref 8.8–10.2)
BKR CHLORIDE: 105 mmol/L (ref 98–107)
BKR CO2: 23 mmol/L (ref 20–30)
BKR CREATININE: 0.77 mg/dL (ref 0.40–1.30)
BKR EGFR, CREATININE (CKD-EPI 2021): >60 mL/min/{1.73_m2} (ref >=60)
BKR GLOBULIN: 3.2 g/dL (ref 2.0–3.9)
BKR GLUCOSE: 99 mg/dL (ref 70–100)
BKR POTASSIUM: 3.9 mmol/L — ABNORMAL LOW (ref 3.3–5.3)
BKR PROTEIN TOTAL: 6.4 g/dL (ref 5.9–8.3)
BKR SODIUM: 138 mmol/L — ABNORMAL LOW (ref 136–144)

## 2022-09-23 LAB — MAGNESIUM: BKR MAGNESIUM: 2.3 mg/dL (ref 1.7–2.4)

## 2022-09-23 LAB — CBC WITH AUTO DIFFERENTIAL
BKR WAM ABSOLUTE NRBC (2 DEC): 0 x 1000/ÂµL (ref 0.00–1.00)
BKR WAM ANC (ABSOLUTE NEUTROPHIL COUNT): 0.01 x 1000/ÂµL — ABNORMAL LOW (ref 2.00–7.60)
BKR WAM HEMATOCRIT (2 DEC): 21.5 % — ABNORMAL LOW (ref 38.50–50.00)
BKR WAM HEMOGLOBIN: 7.4 g/dL — ABNORMAL LOW (ref 13.2–17.1)
BKR WAM MCH (PG): 28.7 pg (ref 27.0–33.0)
BKR WAM MCHC: 34.4 g/dL (ref 31.0–36.0)
BKR WAM MCV: 83.3 fL (ref 80.0–100.0)
BKR WAM NUCLEATED RED BLOOD CELLS: 0 % (ref 0.0–1.0)
BKR WAM PLATELETS: 16 x1000/ÂµL — ABNORMAL LOW (ref 150–420)
BKR WAM RDW-CV: 14.5 % (ref 11.0–15.0)
BKR WAM RED BLOOD CELL COUNT.: 2.58 M/ÂµL — ABNORMAL LOW (ref 4.00–6.00)
BKR WAM WHITE BLOOD CELL COUNT: <0.1 x1000/ÂµL — ABNORMAL LOW (ref 4.0–11.0)

## 2022-09-23 NOTE — Plan of Care
Plan of Care Overview/ Patient Status  0700-1900A and O X4, VSS, independent OOB, no complain of pain/n/v/d during this shift, RDL hickman,c/d/I with positive blood return noted.Bed is in the lowest position, call bell in reach, hourly rounding done.WCTM

## 2022-09-23 NOTE — Plan of Care
Problem: Adult Inpatient Plan of CareGoal: Optimal Comfort and WellbeingOutcome: Interventions implemented as appropriate Problem: Nausea and Vomiting (Chemotherapy Effects)Goal: Fluid and Electrolyte BalanceOutcome: Interventions implemented as appropriate Plan of Care Overview/ Patient Status    19:00-07:00a: Douglas [name] is day -1 of Allo SCT, scheduled for 09/24/22. He A&Ox4. VSS. Afebrile. No s/s of respiratory distress. No c/o pain. Endorses nausea, he refused Zofran, gave him compazine as ordered with good effect; he denies V/D this shift. RDL Hickman flushes w/o difficulty, +BR, dressing CDI. No repletions or transfusions done during this shift. Skin CDI. Voiding spontaneously. OOB independently.Douglas [name] is able to turn and reposition in bed every 2 hours. Neutropenic/Safety precautions set and maintained. Care plan maintained throughout shift.

## 2022-09-23 NOTE — Progress Notes
BMT ATTENDING NOTEDIAGNOSIS: Douglas [name] is 72 y.o. 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 [name] feels well. No overnight events. Some nausea relieved by compazine. Had a normal bowel movement. Feels gum swelling has gone down. Allergies: No Known AllergiesMedications: Scheduled Meds:Current Facility-Administered Medications Medication Dose Route Frequency Provider Last Rate Last Admin  [START ON 09/24/2022] acetaminophen (TYLENOL) tablet 650 mg  650 mg Oral Once Chrystine Oiler, MD      acyclovir (ZOVIRAX) capsule 400 mg  400 mg Oral Q8H Chrystine Oiler, MD   400 mg at 09/22/22 1508  chlorhexidine gluconate (PERIDEX) 0.12 % solution 15 mL  15 mL Mouth/Throat BID Roetta Sessions, MD   15 mL at 09/22/22 1100  ciprofloxacin HCl (CIPRO) tablet 500 mg  500 mg Oral Q12H Chrystine Oiler, MD   500 mg at 09/22/22 1100  [START ON 09/27/2022] cycloPHOSphamide (CYTOXAN) 2,640 mg in sodium chloride 0.9% 500 mL infusion  25 mg/kg (Treatment Plan Recorded) Intravenous Q24H Chrystine Oiler, MD      Melene Muller ON 09/27/2022] diphenhydrAMINE (BENADRYL) injection 25 mg  25 mg IV Push Q24H Chrystine Oiler, MD      Melene Muller ON 09/24/2022] diphenhydrAMINE (BENADRYL) injection 25 mg  25 mg IV Push Once Chrystine Oiler, MD      Melene Muller ON 09/27/2022] doxepin (SINEquan) capsule 25 mg  25 mg Oral Q24H Chrystine Oiler, MD      Melene Muller ON 09/29/2022] filgrastim-sndz (ZARXIO) syringe 480 mcg  5 mcg/kg (Treatment Plan Recorded) Subcutaneous Daily (1700) Chrystine Oiler, MD      finasteride (PROSCAR) tablet 5 mg  5 mg Oral Daily Roetta Sessions, MD   5 mg at 09/22/22 1059  isavuconazonium (Cresemba) capsule Cap 372 mg  372 mg Oral Daily Chrystine Oiler, MD   372 mg at 09/22/22 1059  latanoprost (XALATAN) 0.005 % ophthalmic solution 1 drop  1 drop Both Eyes Nightly Roetta Sessions, MD   1 drop at 09/21/22 2013  [START ON 09/27/2022] mesna (MESNEX) 2,650 mg in sodium chloride 0.9% 500 mL infusion  25 mg/kg (Treatment Plan Recorded) Intravenous Q24H Chrystine Oiler, MD      ondansetron (ZOFRAN-ODT) disintegrating tablet 16 mg  16 mg Translingual Q12H Chrystine Oiler, MD   16 mg at 09/21/22 0540  sodium chloride 0.9 % flush 3 mL  3 mL IV Push Q8H Roetta Sessions, MD   3 mL at 09/22/22 1509  [START ON 09/29/2022] tacrolimus (PROGRAF) immediate release capsule 3 mg  3 mg Oral Q12H (0600-1800) Chrystine Oiler, MD      ursodioL (URSO 250) tablet 500 mg  500 mg Oral BID Chrystine Oiler, MD   500 mg at 09/22/22 1059 Continuous Infusions: [START ON 09/26/2022] D5 1/2 NS 1,000 mL with potassium chloride 20 mEq, magnesium sulfate 1 g infusion    [START ON 09/27/2022] potassium chloride 20 mEq, sodium bicarbonate 50 mEq in D5 1/2 NS 1,000 mL infusion   PRN Meds:.[START ON 09/24/2022] albuterol, [START ON 09/24/2022] diphenhydrAMINE, [START ON 09/24/2022] EPINEPHrine, [START ON 09/24/2022] EPINEPHrine, [START ON 09/24/2022] famotidine (PEPCID) IV (All ages), [START ON 09/24/2022] hydrocortisone IV orderable (all ages), LORazepam, prochlorperazine edisylate, [START ON 09/24/2022] sodium chloride 0.9 % (new bag), sodium chloride, sodium chloride, sodium chloride   PHYSICAL EXAM:BP 120/72  - Pulse 75  - Temp 97.7 ?F (  36.5 ?C) (Oral)  - Resp 18  - Wt 103.4 kg  - SpO2 99%  - BMI 31.79 kg/m? General Appearance:He is heavy set, appears wellHEENT:  Mucous membranes moist, No oral lesions, no erythemaLungs: ClearCor: RRR, no m/r/gAbd: soft nontender, no hepatosplenomegaly, Extr: trace to 1+ edema, petechiae on the feet and toes. Skin: Some actinic changesNeurologic: mentation appears normal, normal speech, strength symmetric.   LABSLab Results Component Value Date/Time  WBC <0.1 (L) 09/22/2022 04:47 AM  HGB 7.7 (L) 09/22/2022 04:47 AM  HCT 22.60 (L) 09/22/2022 04:47 AM  PLT 24 (L) 09/22/2022 04:47 AM  Lab Results Component Value Date/Time  NEUTROPHILS  09/22/2022 04:47 AM    Comment:    Diff Not Performed  Lab Results Component Value Date/Time  Douglas 137 09/22/2022 04:47 AM  K 4.1 09/22/2022 04:47 AM  CL 104 09/22/2022 04:47 AM  CO2 22 09/22/2022 04:47 AM  BUN 12 09/22/2022 04:47 AM  CREATININE 0.90 09/22/2022 04:47 AM  GLU 101 (H) 09/22/2022 04:47 AM  ANIONGAP 11 09/22/2022 04:47 AM  Lab Results Component Value Date/Time  CALCIUM 9.1 09/22/2022 04:47 AM  MG 2.1 09/21/2022 05:38 AM  PHOS 2.2 09/06/2022 10:00 AM  Lab Results Component Value Date/Time  ALT 18 09/22/2022 04:47 AM  AST 26 09/22/2022 04:47 AM  ALKPHOS 90 09/22/2022 04:47 AM  BILITOT 0.5 09/22/2022 04:47 AM  BILIDIR 0.2 09/11/2022 12:31 PM  ALBUMIN 3.3 (L) 09/22/2022 04:47 AM  Lab Results Component Value Date/Time  PTT 33.7 (H) 09/18/2022 04:32 PM  LABPROT 12.6 (H) 09/18/2022 04:32 PM  INR 1.15 (H) 09/18/2022 04:32 PM  Results in Past 7 DaysResult Component Current Result LD 236 (09/18/2022)  IMPRESSION/PLAN: Douglas [name] is 72 y.o. 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  His disease was initially characterized by minor cytopenias over a long period of observation with recently worsened anemia, thrombocytopenia, and a marrow with increased blasts consistent with disease progression with mutations in mutations in TET2, [name], and ASXL1. He has been treated with decitabine and venetoclax with reduction in blast count. The patient has had a formal  search of the registry and a 10/10 match was identified. He is now admitted for reduced intensity conditioning with melphalan and fludarabine. A Mel dose of 70mg /m2 was employed due to age and the dose of PTCy has been modified to 25mg /kg x 2 given age and fully matched MUD.    CONDITIONING:Completed conditioning with Melphalan 70 mg/m2 over 30 minutes on D-6 and Fludarabine 40 mg/m2 over 30 minutes every 24hr for 4 doses from D-5 to D -2.High dose cyclophosphamide 25 mg/kg IV over 1.5 hrs every 24hr for 2 doses to be amdinistered on D +3 & D +4. Doxepin premed 25 mg and diphenhydramine 25 mg 30 mins before Cytoxan; D5W 1/2NS + KCL 20 meq + Magnesium 1 gm alternating with D5W + of NaCHO3 + KCL 20 meq until 24 hours after last Cyclophosphamide dose (hold fluid during Cytoxan infusion) Mesna 25 mg/kg CIVI daily, for 2 doses, starting 15 minutes before cyclophosphamide on D +3 & D +4Cells: Fresh cells, no LR  ID: Prophylaxis with Acyclovir 400 mg PO TID, Ciprofloxacin 500 mg po twice daily, Isavuconazole 372 mg po daily, Pentamidine 300 mg inhalation monthly. If develops neutropenic fever will start Zosyn/Vancomycin.  CMV: R-/D Toxoplasmosis: R-/D, Varicella:  R+/D HEME: Pancytopenia due to chemotherapy. Transfuse to keep Hgb >7 and platelets >10. If GI bleeding re-occurs, will adjust goals. No transfusion today. 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 Tacrolimus CYP3A5 intermediate metabolizer (*1/*3); CYP3A4 normal metabolizer (*1/*1) - Tacrolimus dosing will be based CPIC guideline 0.02 mg/kg/day IV converted to PO. VOD: trend, LFTs, coags, weights post transplant. Ursodiol 500 mg twice daily  SUPPORTIVE CARE: Ondansetron 16 mg PO every 12 hours w/ PRN Compazine, Dexamethasone 12mg  po premed prior to melphalan  Sheryle Hail, MD

## 2022-09-24 ENCOUNTER — Other Ambulatory Visit: Admit: 2022-09-24 | Payer: PRIVATE HEALTH INSURANCE | Attending: Clinical Pathology

## 2022-09-24 ENCOUNTER — Other Ambulatory Visit: Admit: 2022-09-24 | Payer: MEDICARE | Attending: Clinical Pathology

## 2022-09-24 DIAGNOSIS — Z0000 Encounter for general adult medical examination without abnormal findings: Secondary | ICD-10-CM

## 2022-09-24 DIAGNOSIS — C931 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: Secondary | ICD-10-CM

## 2022-09-24 DIAGNOSIS — Z7689 Persons encountering health services in other specified circumstances: Secondary | ICD-10-CM

## 2022-09-24 DIAGNOSIS — C9200 Acute myeloblastic leukemia, not having achieved remission: Secondary | ICD-10-CM

## 2022-09-24 DIAGNOSIS — Z049 Encounter for examination and observation for unspecified reason: Secondary | ICD-10-CM

## 2022-09-24 LAB — CBC WITH AUTO DIFFERENTIAL
BKR WAM ABSOLUTE NRBC (2 DEC): 0 x 1000/ÂµL (ref 0.00–1.00)
BKR WAM ANC (ABSOLUTE NEUTROPHIL COUNT): 0.01 x 1000/ÂµL — ABNORMAL LOW (ref 2.00–7.60)
BKR WAM HEMATOCRIT (2 DEC): 23.8 % — ABNORMAL LOW (ref 38.50–50.00)
BKR WAM HEMOGLOBIN: 8.1 g/dL — ABNORMAL LOW (ref 13.2–17.1)
BKR WAM MCH (PG): 28.5 pg (ref 27.0–33.0)
BKR WAM MCHC: 34 g/dL (ref 31.0–36.0)
BKR WAM MCV: 83.8 fL (ref 80.0–100.0)
BKR WAM NUCLEATED RED BLOOD CELLS: 0 % (ref 0.0–1.0)
BKR WAM PLATELETS: 11 x1000/ÂµL — ABNORMAL LOW (ref 150–420)
BKR WAM RDW-CV: 14.4 % (ref 11.0–15.0)
BKR WAM RED BLOOD CELL COUNT.: 2.84 M/ÂµL — ABNORMAL LOW (ref 4.00–6.00)
BKR WAM WHITE BLOOD CELL COUNT: <0.1 x1000/ÂµL — ABNORMAL LOW (ref 4.0–11.0)

## 2022-09-24 LAB — COMPREHENSIVE METABOLIC PANEL
BKR A/G RATIO: 0.9 — ABNORMAL LOW (ref 1.0–2.2)
BKR ALANINE AMINOTRANSFERASE (ALT): 23 U/L (ref 9–59)
BKR ALBUMIN: 3.3 g/dL — ABNORMAL LOW (ref 3.6–5.1)
BKR ALKALINE PHOSPHATASE: 100 U/L (ref 9–122)
BKR ANION GAP: 10 (ref 7–17)
BKR ASPARTATE AMINOTRANSFERASE (AST): 33 U/L (ref 10–35)
BKR AST/ALT RATIO: 1.4
BKR BILIRUBIN TOTAL: 0.6 mg/dL (ref <=1.2)
BKR BLOOD UREA NITROGEN: 11 mg/dL (ref 8–23)
BKR BUN / CREAT RATIO: 12.6 (ref 8.0–23.0)
BKR CALCIUM: 8.9 mg/dL (ref 8.8–10.2)
BKR CHLORIDE: 105 mmol/L (ref 98–107)
BKR CO2: 22 mmol/L (ref 20–30)
BKR CREATININE: 0.87 mg/dL (ref 0.40–1.30)
BKR EGFR, CREATININE (CKD-EPI 2021): >60 mL/min/{1.73_m2} (ref >=60)
BKR GLOBULIN: 3.6 g/dL (ref 2.0–3.9)
BKR GLUCOSE: 93 mg/dL (ref 70–100)
BKR POTASSIUM: 4 mmol/L (ref 3.3–5.3)
BKR PROTEIN TOTAL: 6.9 g/dL (ref 5.9–8.3)
BKR SODIUM: 137 mmol/L (ref 136–144)

## 2022-09-24 LAB — IMMATURE PLATELET FRACTION (BH GH LMW YH)
BKR WAM IPF, ABSOLUTE: 0.1 x1000/ÂµL (ref <20.0)
BKR WAM IPF: 0.5 % — ABNORMAL LOW (ref 1.2–8.6)

## 2022-09-24 LAB — NT-PROBNPE: BKR B-TYPE NATRIURETIC PEPTIDE, PRO (PROBNP): 122 pg/mL (ref <125.0)

## 2022-09-24 NOTE — Progress Notes
[facility] Inpatient Progress Note ATTENDING PROVIDER: Sheryle Hail, MD DIAGNOSIS:  CMML progressed to AML   CONDITIONING:  Melph/FluTRANSPLANT DATE:   8/8/24DONOR: Male 10/10ABO: Recipient A Pos, Donor A PosCMV: Neg, NegID statement:[name] w/ [name], [name], [name] who is now d-1 from his RIC (melph/flu) MUD PBHCT with tacrolimus and cyclophosphamide GVHD ppx (d0 = 09/24/22).O/n events:- Did not take Zofran yesterday so he had some nausea that was relieved by compazine Subjective: Feels well this AM. No cough or shortness of breath. Had one well formed BM. No nausea or vomiting. No appetite loss. Voiding without difficulty. No rash, fever or chills.  Review of Systems Constitutional: Negative.  Negative for chills, fever and malaise/fatigue. HENT: Negative.  Negative for sore throat.  Eyes: Negative.  Respiratory: Negative.  Negative for cough, sputum production and shortness of breath.  Cardiovascular: Negative.  Negative for chest pain, palpitations and leg swelling. Gastrointestinal: Negative.  Negative for abdominal pain, constipation, diarrhea, nausea and vomiting. Genitourinary:  Negative for dysuria, frequency and urgency. Musculoskeletal: Negative.  Skin: Negative.  Negative for itching and rash.      Toes discolored - not new Neurological: Negative.  Psychiatric/Behavioral: Negative.   Review of Allergies/Meds/Hx: Allergies Allergen Reactions  Voriconazole Other (See Comments)   Gingival edema/redness making it difficult to bite down  Medications:Current Facility-Administered Medications:   [START ON 09/24/2022] acetaminophen (TYLENOL) tablet 650 mg, 650 mg, Oral, Once, Chrystine Oiler, MD  acyclovir (ZOVIRAX) capsule 400 mg, 400 mg, Oral, Q8H, Chrystine Oiler, MD, 400 mg at 09/23/22 0547  [START ON 09/24/2022] albuterol neb sol 2.5 mg/3 mL (0.083%) (PROVENTIL,VENTOLIN), 2.5 mg, Nebulization, Q30 MIN PRN, Chrystine Oiler, MD  chlorhexidine gluconate (PERIDEX) 0.12 % solution 15 mL, 15 mL, Mouth/Throat, BID, Al-Musa, Amer, MD, 15 mL at 09/22/22 1100  ciprofloxacin HCl (CIPRO) tablet 500 mg, 500 mg, Oral, Q12H, Chrystine Oiler, MD, 500 mg at 09/22/22 2023  [START ON 09/27/2022] cycloPHOSphamide (CYTOXAN) 2,640 mg in sodium chloride 0.9% 500 mL infusion, 25 mg/kg (Treatment Plan Recorded), Intravenous, Q24H, Chrystine Oiler, MD  [START ON 09/26/2022] D5 1/2 NS 1,000 mL with potassium chloride 20 mEq, magnesium sulfate 1 g infusion, 150 mL/hr, Intravenous, Continuous, Chrystine Oiler, MD  [START ON 09/27/2022] diphenhydrAMINE (BENADRYL) injection 25 mg, 25 mg, IV Push, Q24H, Chrystine Oiler, MD  [START ON 09/24/2022] diphenhydrAMINE (BENADRYL) injection 25 mg, 25 mg, IV Push, Once, Chrystine Oiler, MD  Melene Muller ON 09/24/2022] diphenhydrAMINE (BENADRYL) injection 50 mg, 50 mg, IV Push, Once PRN, Chrystine Oiler, MD  Melene Muller ON 09/27/2022] doxepin (SINEquan) capsule 25 mg, 25 mg, Oral, Q24H, Chrystine Oiler, MD  Melene Muller ON 09/24/2022] EPINEPHrine (EPI-PEN) auto-injector 0.3 mg, 0.3 mg, Intramuscular, Q15 MIN PRN, Chrystine Oiler, MD  Melene Muller ON 09/24/2022] EPINEPHrine (EPI-PEN) auto-injector 0.3 mg, 0.3 mg, Intramuscular, Q15 MIN PRN, Chrystine Oiler, MD  Melene Muller ON 09/24/2022] famotidine (PF) (PEPCID) 20 mg in sodium chloride 0.9% PF 5 mL Injection, 20 mg, IV Push, Once PRN, Chrystine Oiler, MD  Melene Muller ON 09/29/2022] filgrastim-sndz (ZARXIO) syringe 480 mcg, 5 mcg/kg (Treatment Plan Recorded), Subcutaneous, Daily (1700), Chrystine Oiler, MD  finasteride (PROSCAR) tablet 5 mg, 5 mg, Oral, Daily, Al-Musa, Amer, MD, 5 mg at 09/22/22 1059  [START ON 09/24/2022] hydrocortisone sodium succinate (PF) (Solu-CORTEF) injection 100 mg, 100 mg, IV Push, Once PRN, Chrystine Oiler, MD  isavuconazonium (Cresemba) capsule Cap 372 mg, 372 mg, Oral, Daily, Chrystine Oiler, MD, 372 mg at 09/22/22 1059  latanoprost (XALATAN) 0.005 % ophthalmic solution 1 drop, 1  drop, Both Eyes, Nightly, Al-Musa, Amer, MD, 1 drop at 09/22/22 2024  LORazepam (ATIVAN) tablet 0.5 mg, 0.5 mg, Oral, Q6H PRN, Chrystine Oiler, MD  [START ON 09/27/2022] mesna (MESNEX) 2,650 mg in sodium chloride 0.9% 500 mL infusion, 25 mg/kg (Treatment Plan Recorded), Intravenous, Q24H, Chrystine Oiler, MD  ondansetron (ZOFRAN-ODT) disintegrating tablet 16 mg, 16 mg, Translingual, Q12H, Chrystine Oiler, MD, 16 mg at 09/22/22 1803  [START ON 09/27/2022] potassium chloride 20 mEq, sodium bicarbonate 50 mEq in D5 1/2 NS 1,000 mL infusion, 150 mL/hr, Intravenous, Continuous, Chrystine Oiler, MD  prochlorperazine edisylate (COMPAZINE) injection 10 mg, 10 mg, IV Push, Q6H PRN, Chrystine Oiler, MD, 10 mg at 09/23/22 0021  [START ON 09/24/2022] sodium chloride 0.9 % (new bag) bolus 500 mL, 500 mL, Intravenous, Once PRN, Chrystine Oiler, MD  sodium chloride 0.9 % flush 10 mL, 10 mL, IV Push, PRN for Line Care, Chrystine Oiler, MD  sodium chloride 0.9 % flush 3 mL, 3 mL, IV Push, Q8H, Al-Musa, Amer, MD, 3 mL at 09/22/22 1509  sodium chloride 0.9 % flush 3 mL, 3 mL, IV Push, PRN for Line Care, Roetta Sessions, MD  sodium chloride 0.9% infusion, 5 mL/hr, Intravenous, PRN, Chrystine Oiler, MD  Melene Muller ON 09/29/2022] tacrolimus (PROGRAF) immediate release capsule 3 mg, 3 mg, Oral, Q12H (0600-1800), Chrystine Oiler, MD  ursodioL (URSO 250) tablet 500 mg, 500 mg, Oral, BID, Chrystine Oiler, MD, 500 mg at 09/22/22 2023Objective: Temp:  [97.6 ?F (36.4 ?C)-98.7 ?F (37.1 ?C)] 98.4 ?F (36.9 ?C)Pulse:  [68-75] 71Resp:  [18-19] 18BP: (112-132)/(71-77) 129/73SpO2:  [97 %-99 %] 98 %I/O's:No intake or output data in the 24 hours ending 09/23/22 0749Wt: 09/23/22 101.9 kg 09/16/22 104.2 kg 09/13/22 104.3 kg 09/11/22 105.9 kg 09/09/22 105.4 kg 09/04/22 106.4 kg Physical ExamConstitutional:     Appearance: Normal appearance. He is normal weight. He is not ill-appearing or diaphoretic. HENT:    Head: Normocephalic.    Nose: Nose normal.    Mouth/Throat:    Mouth: Mucous membranes are moist.    Pharynx: Oropharynx is clear. No oropharyngeal exudate. Eyes:    General: No scleral icterus.      Right eye: No discharge.       Left eye: No discharge. Cardiovascular:    Rate and Rhythm: Normal rate and regular rhythm.    Heart sounds: Normal heart sounds. No murmur heard.Pulmonary:    Effort: Pulmonary effort is normal. No respiratory distress.    Breath sounds: Normal breath sounds. No wheezing or rales. Abdominal:    General: Abdomen is flat. Bowel sounds are normal. There is no distension.    Palpations: Abdomen is soft.    Tenderness: There is no abdominal tenderness. There is no guarding. Musculoskeletal:       General: No swelling or deformity. Normal range of motion.    Right lower leg: No edema.    Left lower leg: No edema. Skin:   General: Skin is warm.    Findings: No rash.    Comments: Toes hyperpigmetned/?PVD - not newBruises, petechiae scattered Neurological:    Mental Status: He is alert and oriented to person, place, and time. Psychiatric:       Mood and Affect: Mood normal. Labs:Recent Results (from the past 24 hour(s)) Magnesium  Collection Time: 09/23/22  5:48 AM Result Value Ref Range  Magnesium 2.3 1.7 - 2.4 mg/dL CBC auto differential  Collection Time: 09/23/22  5:48 AM Result Value Ref Range  WBC <0.1 (L) 4.0 - 11.0 x1000/?L  RBC 2.58 (  L) 4.00 - 6.00 M/?L  Hemoglobin 7.4 (L) 13.2 - 17.1 g/dL  Hematocrit 09.81 (L) 19.14 - 50.00 %  MCV 83.3 80.0 - 100.0 fL  MCH 28.7 27.0 - 33.0 pg  MCHC 34.4 31.0 - 36.0 g/dL  RDW-CV 78.2 95.6 - 21.3 %  Platelets 16 (L) 150 - 420 x1000/?L  MPV    Neutrophils    Lymphocytes    Monocytes    Eosinophils Basophil    Immature Granulocytes    nRBC 0.0 0.0 - 1.0 %  Absolute Lymphocyte Count    Monocyte Absolute Count    Eosinophil Absolute Count    Basophil Absolute Count    Absolute Immature Granulocyte Count    Absolute nRBC 0.00 0.00 - 1.00 x 1000/?L  ANC (Abs Neutrophil Count) 0.01 (L) 2.00 - 7.60 x 1000/?L Comprehensive metabolic panel  Collection Time: 09/23/22  5:48 AM Result Value Ref Range  Sodium 138 136 - 144 mmol/L  Potassium 3.9 3.3 - 5.3 mmol/L  Chloride 105 98 - 107 mmol/L  CO2 23 20 - 30 mmol/L  Anion Gap 10 7 - 17  Glucose 99 70 - 100 mg/dL  BUN 10 8 - 23 mg/dL  Creatinine 0.86 5.78 - 1.30 mg/dL  Calcium 8.8 8.8 - 46.9 mg/dL  BUN/Creatinine Ratio 62.9 8.0 - 23.0  Total Protein 6.4 5.9 - 8.3 g/dL  Albumin 3.2 (L) 3.6 - 5.1 g/dL  Total Bilirubin 0.5 <=5.2 mg/dL  Alkaline Phosphatase 90 9 - 122 U/L  Alanine Aminotransferase (ALT) 19 9 - 59 U/L  Aspartate Aminotransferase (AST) 21 10 - 35 U/L  Globulin 3.2 2.0 - 3.9 g/dL  A/G Ratio 1.0 1.0 - 2.2  AST/ALT Ratio 1.1 Reference Range Not Established  eGFR (Creatinine) >60 >=60 mL/min/1.28m2 Immature Platelet Fraction (BH GH LMW YH)  Collection Time: 09/23/22  5:48 AM Result Value Ref Range  Immature Platelet Fraction 1.3 1.2 - 8.6 %  Absolute Immature Platelet Fraction 0.2 <20.0 x1000/?L Diagnostics:No results found.Assessment: Douglas [name] is a 72 y.o. 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 He is s/p 2 cycles of chemotherapy with Decitabine + Venetoclax with reduction in blast count. 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  Plan:  Day -1:  Conditioning/Transplant: CMML -Melphalan 70 mg/m2 over 30 minutes on D-6, *Melphalan dosed reduced to 70 mg/m2 due to age-Fludarabine 40 mg/m2 over 30 minutes every 24hr for 4 doses from D-5 to D -2             -PT cyclophosphamide 25 mg/kg IV over 1.5 hrs every 24hr for 2 doses on D +3 & D +4 (reduced by 50% to 25 mg/kg due 10/10 match and age)-Mesna 50 mg/kg CIVI daily, for 2 doses, starting 15 minutes before cyclophosphamide on D +3 & D +4-Stem cell infusion day 0 (09/24/22) HEME: - Transfuse pRBCs to maintain Hb >7 g/dL, platelets >84 or if clinically indicated- Filgrastim 51mcg/kg starting on day +5 until stable engraftment of neutrophils. ID: Prophylactic antimicrobials: Acyclovir 400 mg PO TID (home medications), Ciprofloxacin 500 mg po twice daily, Isavuconazole 372 mg po daily (prior authorization pending, voriconazole allergy-gingival hyperplasia), Pentamidine 300 mg inhalation monthly- CMV -/pending, attempt to get letermovir for CMV prophylaxis. Will send script on Day +7 to determine insurance coverage before starting inpatient.Check CMV PCR weekly.- If febrile, panculture, UA and CXR, start broad spectrum empiric  antibiotics if neutropenic or septic appearing.   - Repeat blood cx every 24 hours with temperature > 100.4 or septic appearing GVHD Prophylaxis:  - Tacrolimus  3mg  PO starting day +5.  Goal of 6-11ng/ml- PT cyclophosphamide 25 mg/kg IV over 1.5 hrs every 24hr for 2 doses on D +3 & D +4 with mesna daily on Day +3 and Day +4  (PTCy dose reduced by 50% )- Monitor levels, haptoglobin, LDH, triglycerides.- Daily skin exam, liver function tests twice weekly (minimum), document stool output/day. Fluids, Electrolytes and Nutrition:- Regular diet, consider nutrition consult if needed- Daily weights- Strict Intake and output- Replete electrolytes PRNEmesis prophylaxis: Ondansetron 16 mg PO every 12 hours w/ PRN Compazine, Dexamethasone 12mg  po premed prior to melphalan High dose cyclophosphamide: doxepin premed 25 mg and diphenhydramine 25 mg 30 mins before Cytoxan; D5W 1/2NS + KCL 20 meq + Magnesium 1 gm alternating with D5W + of NaCHO3 + KCL 20 meq until 24 hours after last Cyclophosphamide dose (hold fluid during Cytoxan infusion) VOD: - Continue Ursodiol 500mg  PO BID, continue until Day +90- Daily weights, strict fluid monitoring of all intake/output- Monitor liver function tests and coags at least twice weekly HTNPatient reports not taking his antiHTN meds for the last few days due to feeling weak. - Will hold home antihypertensive meds for now - monitor BP and reorder as appropriate  CVAD: [facility] for chemotherapy and supportive care Discharge Planning: Pending count recovery 24 hour caregiver: Signed:Amer Al-Musa, MDPGY-2, Internal MedicineAvailable via MHB8/08/2022 BMT ATTENDING ADDENDUM: I saw and examined the patient. I discussed the case with the resident, Dr. Carolyne Littles and I agree with the history, physical assessment and plan. Please see my separate note for details.Sheryle Hail, MD

## 2022-09-24 NOTE — Progress Notes
BMT ATTENDING NOTEDIAGNOSIS: IZAAK ARMS is 72 y.o. 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 [name] feels well. No overnight events. Some nausea controlled with meds. Had diarrhea. C.diff was sent. No bleeding. Allergies: No Known AllergiesMedications: Scheduled Meds:Current Facility-Administered Medications Medication Dose Route Frequency Provider Last Rate Last Admin  acetaminophen (TYLENOL) tablet 650 mg  650 mg Oral Once Chrystine Oiler, MD      acyclovir (ZOVIRAX) capsule 400 mg  400 mg Oral Q8H Chrystine Oiler, MD   400 mg at 09/24/22 0102  chlorhexidine gluconate (PERIDEX) 0.12 % solution 15 mL  15 mL Mouth/Throat BID Roetta Sessions, MD   15 mL at 09/24/22 0935  ciprofloxacin HCl (CIPRO) tablet 500 mg  500 mg Oral Q12H Chrystine Oiler, MD   500 mg at 09/24/22 0935  [START ON 09/27/2022] cycloPHOSphamide (CYTOXAN) 2,640 mg in sodium chloride 0.9% 500 mL infusion  25 mg/kg (Treatment Plan Recorded) Intravenous Q24H Chrystine Oiler, MD      Melene Muller ON 09/27/2022] diphenhydrAMINE (BENADRYL) injection 25 mg  25 mg IV Push Q24H Chrystine Oiler, MD      diphenhydrAMINE (BENADRYL) injection 25 mg  25 mg IV Push Once Chrystine Oiler, MD      Melene Muller ON 09/27/2022] doxepin (SINEquan) capsule 25 mg  25 mg Oral Q24H Chrystine Oiler, MD      Melene Muller ON 09/29/2022] filgrastim-sndz (ZARXIO) syringe 480 mcg  5 mcg/kg (Treatment Plan Recorded) Subcutaneous Daily (1700) Chrystine Oiler, MD      finasteride (PROSCAR) tablet 5 mg  5 mg Oral Daily Roetta Sessions, MD   5 mg at 09/24/22 0935  isavuconazonium (Cresemba) capsule Cap 372 mg  372 mg Oral Daily Chrystine Oiler, MD   372 mg at 09/24/22 0935  latanoprost (XALATAN) 0.005 % ophthalmic solution 1 drop  1 drop Both Eyes Nightly Roetta Sessions, MD   1 drop at 09/22/22 2024  [START ON 09/27/2022] mesna (MESNEX) 2,650 mg in sodium chloride 0.9% 500 mL infusion  25 mg/kg (Treatment Plan Recorded) Intravenous Q24H Chrystine Oiler, MD      ondansetron (ZOFRAN-ODT) disintegrating tablet 16 mg  16 mg Translingual Q12H Chrystine Oiler, MD   16 mg at 09/24/22 7253  sodium chloride 0.9 % flush 3 mL  3 mL IV Push Q8H Roetta Sessions, MD   3 mL at 09/22/22 1509  [START ON 09/29/2022] tacrolimus (PROGRAF) immediate release capsule 3 mg  3 mg Oral Q12H (0600-1800) Chrystine Oiler, MD      ursodioL (URSO 250) tablet 500 mg  500 mg Oral BID Chrystine Oiler, MD   500 mg at 09/24/22 0935 Continuous Infusions: [START ON 09/26/2022] D5 1/2 NS 1,000 mL with potassium chloride 20 mEq, magnesium sulfate 1 g infusion    [START ON 09/27/2022] potassium chloride 20 mEq, sodium bicarbonate 50 mEq in D5 1/2 NS 1,000 mL infusion   PRN Meds:.albuterol, diphenhydrAMINE, EPINEPHrine, EPINEPHrine, famotidine (PEPCID) IV (All ages), hydrocortisone IV orderable (all ages), LORazepam, prochlorperazine edisylate, sodium chloride 0.9 % (new bag), sodium chloride, sodium chloride, sodium chloride   PHYSICAL EXAM:BP 127/73  - Pulse 89  - Temp 97.9 ?F (36.6 ?C) (Oral)  - Resp 18  - Wt 101.9 kg  - SpO2 100%  - BMI 31.33 kg/m? General Appearance:He is heavy set, appears wellHEENT:  Mucous membranes  moist, No oral lesions, no erythemaLungs: ClearCor: RRR, no m/r/gAbd: soft nontender, no hepatosplenomegaly, Extr: trace to 1+ edema, petechiae on the feet and toes. Skin: Some actinic changesNeurologic: mentation appears normal, normal speech, strength symmetric.   LABSLab Results Component Value Date/Time  WBC <0.1 (L) 09/24/2022 06:27 AM  HGB 8.1 (L) 09/24/2022 06:27 AM  HCT 23.80 (L) 09/24/2022 06:27 AM  PLT 11 (L) 09/24/2022 06:27 AM  Lab Results Component Value Date/Time  NEUTROPHILS  09/24/2022 06:27 AM    Comment:    Diff Not Performed  Lab Results Component Value Date/Time  NA 137 09/24/2022 06:27 AM  K 4.0 09/24/2022 06:27 AM  CL 105 09/24/2022 06:27 AM  CO2 22 09/24/2022 06:27 AM  BUN 11 09/24/2022 06:27 AM  CREATININE 0.87 09/24/2022 06:27 AM  GLU 93 09/24/2022 06:27 AM  ANIONGAP 10 09/24/2022 06:27 AM  Lab Results Component Value Date/Time  CALCIUM 8.9 09/24/2022 06:27 AM  MG 2.3 09/23/2022 05:48 AM  PHOS 2.2 09/06/2022 10:00 AM  Lab Results Component Value Date/Time  ALT 23 09/24/2022 06:27 AM  AST 33 09/24/2022 06:27 AM  ALKPHOS 100 09/24/2022 06:27 AM  BILITOT 0.6 09/24/2022 06:27 AM  BILIDIR 0.2 09/11/2022 12:31 PM  ALBUMIN 3.3 (L) 09/24/2022 06:27 AM  Lab Results Component Value Date/Time  PTT 33.7 (H) 09/18/2022 04:32 PM  LABPROT 12.6 (H) 09/18/2022 04:32 PM  INR 1.15 (H) 09/18/2022 04:32 PM  Results in Past 7 DaysResult Component Current Result LD 236 (09/18/2022)  IMPRESSION/PLAN: GERADO COMPANION is 72 y.o. 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  His disease was initially characterized by minor cytopenias over a long period of observation with recently worsened anemia, thrombocytopenia, and a marrow with increased blasts consistent with disease progression with mutations in mutations in TET2, [name], and ASXL1. He has been treated with decitabine and venetoclax with reduction in blast count. The patient has had a formal  search of the registry and a 10/10 match was identified. He is now admitted for reduced intensity conditioning with melphalan and fludarabine. A Mel dose of 70mg /m2 was employed due to age and the dose of PTCy has been modified to 25mg /kg x 2 given age and fully matched MUD.    CONDITIONING:Completed conditioning with Melphalan 70 mg/m2 over 30 minutes on D-6 and Fludarabine 40 mg/m2 over 30 minutes every 24hr for 4 doses from D-5 to D -2. Completed high dose cyclophosphamide 25 mg/kg IV over 1.5 hrs every 24hr for 2 doses to be amdinistered on D +3 & D +4. Cells: Fresh cells, no LR to be administered later this evening when they arrive. ID: Prophylaxis with Acyclovir 400 mg PO TID, Ciprofloxacin 500 mg po twice daily, Isavuconazole 372 mg po daily, Pentamidine 300 mg inhalation monthly. If develops neutropenic fever will start Zosyn/Vancomycin.  CMV: R-/D Toxoplasmosis: R-/D, Varicella:  R+/D HEME: Pancytopenia due to chemotherapy. Transfuse to keep Hgb >7 and platelets >15. Has hx of GI bleeding prior to this admission. If GI bleeding re-occurs, will adjust goals. Will give platelet transfusion today. 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 Tacrolimus CYP3A5 intermediate metabolizer (*1/*3); CYP3A4 normal metabolizer (*1/*1) - Tacrolimus dosing will be based CPIC guideline 0.02 mg/kg/day IV converted to PO. VOD: trend, LFTs, coags, weights post transplant. Ursodiol 500 mg twice daily  SUPPORTIVE CARE: Ondansetron 16 mg PO every 12 hours w/ PRN Compazine, Dexamethasone 12mg  po premed prior to melphalan  Sheryle Hail, MD

## 2022-09-24 NOTE — Plan of Care
Plan of Care Overview/ Patient Status    0700-1530Patient is Day 0 for his MUD SCT for his AML.He is alert and oriented times four and independent in his room today. He complained of diarrhea this morning and a c.diff was ordered but then he had no more diarrhea. He was transfused platelets for a count of 11,000 without premedication without problems.His wife is at the bedside for support this afternoon.He was afebrile on ciprofloxacin,acyclovir,and cresemba. He was able to drink an apple ensure with cranberry juice.He is awaiting his stem cells with are fresh and will arrive this evening. He is on fall,neutropenic,and protective precautions for his safety. He stated he got a dirty fork with his pancake lunch and a service recovery was  done. He requested a cot for his wife who will spend a couple nights here and it was put in his room.

## 2022-09-24 NOTE — Progress Notes
[facility] Inpatient Progress Note ATTENDING PROVIDER: Sheryle Hail, MD DIAGNOSIS:  CMML progressed to AML   CONDITIONING:  Melph/FluTRANSPLANT DATE:   8/8/24DONOR: Male 10/10ABO: Recipient A Pos, Donor A PosCMV: Neg, NegID statement:[name] w/ [name], [name], [name] who is now d-2 from his RIC (melph/flu) MUD PBHCT with tacrolimus and cyclophosphamide GVHD ppx (d0 = 09/24/22).O/n events:- Platelet transfusion yesterday for plt of 9 Subjective: Feels well this AM. No cough or shortness of breath. No nausea or vomiting. No appetite loss. Reports having a BM yesterday, and feels like gums are less swollen. Voiding without difficulty. No rash, fever or chills. Review of Systems Constitutional: Negative.  Negative for chills, fever and malaise/fatigue. HENT: Negative.  Negative for sore throat.  Eyes: Negative.  Respiratory: Negative.  Negative for cough, sputum production and shortness of breath.  Cardiovascular: Negative.  Negative for chest pain, palpitations and leg swelling. Gastrointestinal: Negative.  Negative for abdominal pain, constipation, diarrhea, nausea and vomiting. Genitourinary:  Negative for dysuria, frequency and urgency. Musculoskeletal: Negative.  Skin: Negative.  Negative for itching and rash.      Toes discolored - not new Neurological: Negative.  Psychiatric/Behavioral: Negative.   Review of Allergies/Meds/Hx: Allergies Allergen Reactions  Voriconazole Other (See Comments)   Gingival edema/redness making it difficult to bite down  Medications:Current Facility-Administered Medications:   [START ON 09/24/2022] acetaminophen (TYLENOL) tablet 650 mg, 650 mg, Oral, Once, Chrystine Oiler, MD  acyclovir (ZOVIRAX) capsule 400 mg, 400 mg, Oral, Q8H, Chrystine Oiler, MD, 400 mg at 09/22/22 0436  [START ON 09/24/2022] albuterol neb sol 2.5 mg/3 mL (0.083%) (PROVENTIL,VENTOLIN), 2.5 mg, Nebulization, Q30 MIN PRN, Chrystine Oiler, MD  chlorhexidine gluconate (PERIDEX) 0.12 % solution 15 mL, 15 mL, Mouth/Throat, BID, Carolyne Littles, Amer, MD, 15 mL at 09/21/22 2011  ciprofloxacin HCl (CIPRO) tablet 500 mg, 500 mg, Oral, Q12H, Chrystine Oiler, MD, 500 mg at 09/21/22 2012  [START ON 09/27/2022] cycloPHOSphamide (CYTOXAN) 2,640 mg in sodium chloride 0.9% 500 mL infusion, 25 mg/kg (Treatment Plan Recorded), Intravenous, Q24H, Chrystine Oiler, MD  [START ON 09/26/2022] D5 1/2 NS 1,000 mL with potassium chloride 20 mEq, magnesium sulfate 1 g infusion, 150 mL/hr, Intravenous, Continuous, Chrystine Oiler, MD  [START ON 09/27/2022] diphenhydrAMINE (BENADRYL) injection 25 mg, 25 mg, IV Push, Q24H, Chrystine Oiler, MD  [START ON 09/24/2022] diphenhydrAMINE (BENADRYL) injection 25 mg, 25 mg, IV Push, Once, Chrystine Oiler, MD  Melene Muller ON 09/24/2022] diphenhydrAMINE (BENADRYL) injection 50 mg, 50 mg, IV Push, Once PRN, Chrystine Oiler, MD  Melene Muller ON 09/27/2022] doxepin (SINEquan) capsule 25 mg, 25 mg, Oral, Q24H, Chrystine Oiler, MD  Melene Muller ON 09/24/2022] EPINEPHrine (EPI-PEN) auto-injector 0.3 mg, 0.3 mg, Intramuscular, Q15 MIN PRN, Chrystine Oiler, MD  Melene Muller ON 09/24/2022] EPINEPHrine (EPI-PEN) auto-injector 0.3 mg, 0.3 mg, Intramuscular, Q15 MIN PRN, Chrystine Oiler, MD  Melene Muller ON 09/24/2022] famotidine (PF) (PEPCID) 20 mg in sodium chloride 0.9% PF 5 mL Injection, 20 mg, IV Push, Once PRN, Chrystine Oiler, MD  Melene Muller ON 09/29/2022] filgrastim-sndz (ZARXIO) syringe 480 mcg, 5 mcg/kg (Treatment Plan Recorded), Subcutaneous, Daily (1700), Chrystine Oiler, MD  finasteride (PROSCAR) tablet 5 mg, 5 mg, Oral, Daily, Al-Musa, Amer, MD, 5 mg at 09/21/22 0853  FLUDARabine (FLUDARA) 92 mg in dextrose 5 % in water (D5W) 100 mL infusion, 40 mg/m2 (Treatment Plan Recorded), Intravenous, Q24H, Chrystine Oiler, MD, Last Rate: 200 mL/hr at 09/21/22 1533, 92 mg at 09/21/22 1533  [START ON 09/24/2022] hydrocortisone sodium succinate (PF) (Solu-CORTEF) injection 100 mg, 100 mg, IV  Push, Once PRN, Chrystine Oiler, MD  isavuconazonium (Cresemba) capsule Cap 372 mg, 372 mg, Oral, Daily, Chrystine Oiler, MD, 372 mg at 09/21/22 0853  latanoprost (XALATAN) 0.005 % ophthalmic solution 1 drop, 1 drop, Both Eyes, Nightly, Al-Musa, Amer, MD, 1 drop at 09/21/22 2013  LORazepam (ATIVAN) tablet 0.5 mg, 0.5 mg, Oral, Q6H PRN, Chrystine Oiler, MD  Melene Muller ON 09/27/2022] mesna (MESNEX) 2,650 mg in sodium chloride 0.9% 500 mL infusion, 25 mg/kg (Treatment Plan Recorded), Intravenous, Q24H, Chrystine Oiler, MD  ondansetron (ZOFRAN-ODT) disintegrating tablet 16 mg, 16 mg, Translingual, Q12H, Chrystine Oiler, MD, 16 mg at 09/21/22 0540  [START ON 09/27/2022] potassium chloride 20 mEq, sodium bicarbonate 50 mEq in D5 1/2 NS 1,000 mL infusion, 150 mL/hr, Intravenous, Continuous, Chrystine Oiler, MD  prochlorperazine edisylate (COMPAZINE) injection 10 mg, 10 mg, IV Push, Q6H PRN, Chrystine Oiler, MD  Melene Muller ON 09/24/2022] sodium chloride 0.9 % (new bag) bolus 500 mL, 500 mL, Intravenous, Once PRN, Chrystine Oiler, MD  sodium chloride 0.9 % flush 10 mL, 10 mL, IV Push, PRN for Line Care, Chrystine Oiler, MD  sodium chloride 0.9 % flush 3 mL, 3 mL, IV Push, Q8H, Al-Musa, Amer, MD  sodium chloride 0.9 % flush 3 mL, 3 mL, IV Push, PRN for Line Care, Roetta Sessions, MD  sodium chloride 0.9% infusion, 5 mL/hr, Intravenous, PRN, Chrystine Oiler, MD  Melene Muller ON 09/29/2022] tacrolimus (PROGRAF) immediate release capsule 3 mg, 3 mg, Oral, Q12H (0600-1800), Chrystine Oiler, MD  ursodioL (URSO 250) tablet 500 mg, 500 mg, Oral, BID, Chrystine Oiler, MD, 500 mg at 09/21/22 2011Objective: Temp:  [97.8 ?F (36.6 ?C)-98.9 ?F (37.2 ?C)] 98.5 ?F (36.9 ?C)Pulse:  [68-102] 71Resp:  [18] 18BP: (116-153)/(63-84) 117/70SpO2:  [95 %-99 %] 95 %I/O's:Gross Totals (Last 24 hours) at 09/22/2022 0800Last data filed at 09/21/2022 1939Intake 720 ml Output -- Net 720 ml Wt: 09/22/22 103.4 kg 09/16/22 104.2 kg 09/13/22 104.3 kg 09/11/22 105.9 kg 09/09/22 105.4 kg 09/04/22 106.4 kg Physical ExamConstitutional:     Appearance: Normal appearance. He is normal weight. He is not ill-appearing or diaphoretic. HENT:    Head: Normocephalic.    Nose: Nose normal.    Mouth/Throat:    Mouth: Mucous membranes are moist.    Pharynx: Oropharynx is clear. No oropharyngeal exudate. Eyes:    General: No scleral icterus.      Right eye: No discharge.       Left eye: No discharge. Cardiovascular:    Rate and Rhythm: Normal rate and regular rhythm.    Heart sounds: Normal heart sounds. No murmur heard.Pulmonary:    Effort: Pulmonary effort is normal. No respiratory distress.    Breath sounds: Normal breath sounds. No wheezing or rales. Abdominal:    General: Abdomen is flat. Bowel sounds are normal. There is no distension.    Palpations: Abdomen is soft.    Tenderness: There is no abdominal tenderness. There is no guarding. Musculoskeletal:       General: No swelling or deformity. Normal range of motion.    Right lower leg: No edema.    Left lower leg: No edema. Skin:   General: Skin is warm.    Findings: No rash.    Comments: Toes hyperpigmetned/?PVD - not newBruises, petechiae scattered Neurological:    Mental Status: He is alert and oriented to person, place, and time. Psychiatric:       Mood and Affect: Mood normal. Labs:Recent Results (from the past 24 hour(s)) Comprehensive metabolic panel  Collection Time: 09/22/22  4:47 AM  Result Value Ref Range  Sodium 137 136 - 144 mmol/L  Potassium 4.1 3.3 - 5.3 mmol/L  Chloride 104 98 - 107 mmol/L  CO2 22 20 - 30 mmol/L  Anion Gap 11 7 - 17  Glucose 101 (H) 70 - 100 mg/dL  BUN 12 8 - 23 mg/dL  Creatinine 5.40 9.81 - 1.30 mg/dL  Calcium 9.1 8.8 - 19.1 mg/dL  BUN/Creatinine Ratio 47.8 8.0 - 23.0  Total Protein 6.8 5.9 - 8.3 g/dL  Albumin 3.3 (L) 3.6 - 5.1 g/dL  Total Bilirubin 0.5 <=2.9 mg/dL  Alkaline Phosphatase 90 9 - 122 U/L  Alanine Aminotransferase (ALT) 18 9 - 59 U/L  Aspartate Aminotransferase (AST) 26 10 - 35 U/L  Globulin 3.5 2.0 - 3.9 g/dL  A/G Ratio 0.9 (L) 1.0 - 2.2  AST/ALT Ratio 1.4 Reference Range Not Established  eGFR (Creatinine) >60 >=60 mL/min/1.90m2 CBC auto differential  Collection Time: 09/22/22  4:47 AM Result Value Ref Range  WBC <0.1 (L) 4.0 - 11.0 x1000/?L  RBC 2.70 (L) 4.00 - 6.00 M/?L  Hemoglobin 7.7 (L) 13.2 - 17.1 g/dL  Hematocrit 56.21 (L) 30.86 - 50.00 %  MCV 83.7 80.0 - 100.0 fL  MCH 28.5 27.0 - 33.0 pg  MCHC 34.1 31.0 - 36.0 g/dL  RDW-CV 57.8 46.9 - 62.9 %  Platelets 24 (L) 150 - 420 x1000/?L  MPV    Neutrophils    Lymphocytes    Monocytes    Eosinophils    Basophil    Immature Granulocytes    nRBC 0.0 0.0 - 1.0 %  Absolute Lymphocyte Count    Monocyte Absolute Count    Eosinophil Absolute Count    Basophil Absolute Count    Absolute Immature Granulocyte Count    Absolute nRBC 0.00 0.00 - 1.00 x 1000/?L  ANC (Abs Neutrophil Count) 0.01 (L) 2.00 - 7.60 x 1000/?L Immature Platelet Fraction (BH GH LMW YH)  Collection Time: 09/22/22  4:47 AM Result Value Ref Range  Immature Platelet Fraction 1.1 (L) 1.2 - 8.6 %  Absolute Immature Platelet Fraction 0.3 <20.0 x1000/?L Diagnostics:No results found.Assessment: ANDER WATRING is a 72 y.o. 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 He is s/p 2 cycles of chemotherapy with Decitabine + Venetoclax with reduction in blast count. 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  Plan:  Day -2:  Conditioning/Transplant: CMML -Melphalan 70 mg/m2 over 30 minutes on D-6, *Melphalan dosed reduced to 70 mg/m2 due to age-Fludarabine 40 mg/m2 over 30 minutes every 24hr for 4 doses from D-5 to D -2             -PT cyclophosphamide 25 mg/kg IV over 1.5 hrs every 24hr for 2 doses on D +3 & D +4 (reduced by 50% to 25 mg/kg due 10/10 match and age)-Mesna 50 mg/kg CIVI daily, for 2 doses, starting 15 minutes before cyclophosphamide on D +3 & D +4-Stem cell infusion day 0 (09/24/22) HEME: - Transfuse pRBCs to maintain Hb >7 g/dL, platelets >52 or if clinically indicated- Filgrastim 25mcg/kg starting on day +5 until stable engraftment of neutrophils. ID: Prophylactic antimicrobials: Acyclovir 400 mg PO TID (home medications), Ciprofloxacin 500 mg po twice daily, Isavuconazole 372 mg po daily (prior authorization pending, voriconazole allergy-gingival hyperplasia), Pentamidine 300 mg inhalation monthly- CMV -/pending, attempt to get letermovir for CMV prophylaxis. Will send script on Day +  7 to determine insurance coverage before starting inpatient.Check CMV PCR weekly.- If febrile, panculture, UA and CXR, start broad spectrum empiric antibiotics if neutropenic or septic appearing.   - Repeat blood cx every 24 hours with temperature > 100.4 or septic appearing GVHD Prophylaxis:  - Tacrolimus  3mg  PO starting day +5.  Goal of 6-11ng/ml- PT cyclophosphamide 25 mg/kg IV over 1.5 hrs every 24hr for 2 doses on D +3 & D +4 with mesna daily on Day +3 and Day +4  (PTCy dose reduced by 50% )- Monitor levels, haptoglobin, LDH, triglycerides.- Daily skin exam, liver function tests twice weekly (minimum), document stool output/day. Fluids, Electrolytes and Nutrition:- Regular diet, consider nutrition consult if needed- Daily weights- Strict Intake and output- Replete electrolytes PRNEmesis prophylaxis: Ondansetron 16 mg PO every 12 hours w/ PRN Compazine, Dexamethasone 12mg  po premed prior to melphalan High dose cyclophosphamide: doxepin premed 25 mg and diphenhydramine 25 mg 30 mins before Cytoxan; D5W 1/2NS + KCL 20 meq + Magnesium 1 gm alternating with D5W + of NaCHO3 + KCL 20 meq until 24 hours after last Cyclophosphamide dose (hold fluid during Cytoxan infusion) VOD: - Continue Ursodiol 500mg  PO BID, continue until Day +90- Daily weights, strict fluid monitoring of all intake/output- Monitor liver function tests and coags at least twice weekly HTNPatient reports not taking his antiHTN meds for the last few days due to feeling weak. - Will hold home antihypertensive meds for now - monitor BP and reorder as appropriate  CVAD: [facility] for chemotherapy and supportive care Discharge Planning: Pending count recovery 24 hour caregiver: Signed:Amer Al-Musa, MDPGY-2, Internal MedicineAvailable via MHB8/07/2022 BMT ATTENDING ADDENDUM: I saw and examined the patient. I discussed the case with the resident, Dr. Carolyne Littles and I agree with the history, physical assessment and plan. Please see my separate note for details.Sheryle Hail, MD

## 2022-09-24 NOTE — Progress Notes
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 No cough or shortness of breath. No nausea or vomiting. No appetite loss. Voiding without difficulty. No rash, fever or chills.  Review of Systems Constitutional: Negative.  Negative for chills, fever and malaise/fatigue. HENT: Negative.  Negative for sore throat.  Eyes: Negative.  Respiratory: Negative.  Negative for cough, sputum production and shortness of breath.  Cardiovascular: Negative.  Negative for chest pain, palpitations and leg swelling. Gastrointestinal: Negative.  Negative for abdominal pain, constipation, diarrhea, nausea and vomiting. Genitourinary:  Negative for dysuria, frequency and urgency. Musculoskeletal: Negative.  Skin: Negative.  Negative for itching and rash.      Toes discolored - not new Neurological: Negative.  Psychiatric/Behavioral: Negative.   Review of Allergies/Meds/Hx: Allergies Allergen Reactions  Voriconazole Other (See Comments)   Gingival edema/redness making it difficult to bite down  Medications:Current Facility-Administered Medications:   acetaminophen (TYLENOL) tablet 650 mg, 650 mg, Oral, Once, Chrystine Oiler, MD  acyclovir (ZOVIRAX) capsule 400 mg, 400 mg, Oral, Q8H, Chrystine Oiler, MD, 400 mg at 09/24/22 8295  albuterol neb sol 2.5 mg/3 mL (0.083%) (PROVENTIL,VENTOLIN), 2.5 mg, Nebulization, Q30 MIN PRN, Chrystine Oiler, MD  chlorhexidine gluconate (PERIDEX) 0.12 % solution 15 mL, 15 mL, Mouth/Throat, BID, Al-Musa, Amer, MD, 15 mL at 09/23/22 0845  ciprofloxacin HCl (CIPRO) tablet 500 mg, 500 mg, Oral, Q12H, Chrystine Oiler, MD, 500 mg at 09/23/22 2041  [START ON 09/27/2022] cycloPHOSphamide (CYTOXAN) 2,640 mg in sodium chloride 0.9% 500 mL infusion, 25 mg/kg (Treatment Plan Recorded), Intravenous, Q24H, Chrystine Oiler, MD  [START ON 09/26/2022] D5 1/2 NS 1,000 mL with potassium chloride 20 mEq, magnesium sulfate 1 g infusion, 150 mL/hr, Intravenous, Continuous, Chrystine Oiler, MD  Melene Muller ON 09/27/2022] diphenhydrAMINE (BENADRYL) injection 25 mg, 25 mg, IV Push, Q24H, Chrystine Oiler, MD  diphenhydrAMINE (BENADRYL) injection 25 mg, 25 mg, IV Push, Once, Chrystine Oiler, MD  diphenhydrAMINE (BENADRYL) injection 50 mg, 50 mg, IV Push, Once PRN, Chrystine Oiler, MD  Melene Muller ON 09/27/2022] doxepin (SINEquan) capsule 25 mg, 25 mg, Oral, Q24H, Chrystine Oiler, MD  EPINEPHrine (EPI-PEN) auto-injector 0.3 mg, 0.3 mg, Intramuscular, Q15 MIN PRN, Chrystine Oiler, MD  EPINEPHrine (EPI-PEN) auto-injector 0.3 mg, 0.3 mg, Intramuscular, Q15 MIN PRN, Chrystine Oiler, MD  famotidine (PF) (PEPCID) 20 mg in sodium chloride 0.9% PF 5 mL Injection, 20 mg, IV Push, Once PRN, Chrystine Oiler, MD  Melene Muller ON 09/29/2022] filgrastim-sndz (ZARXIO) syringe 480 mcg, 5 mcg/kg (Treatment Plan Recorded), Subcutaneous, Daily (1700), Chrystine Oiler, MD  finasteride (PROSCAR) tablet 5 mg, 5 mg, Oral, Daily, Al-Musa, Amer, MD, 5 mg at 09/23/22 0845  hydrocortisone sodium succinate (PF) (Solu-CORTEF) injection 100 mg, 100 mg, IV Push, Once PRN, Chrystine Oiler, MD  isavuconazonium (Cresemba) capsule Cap 372 mg, 372 mg, Oral, Daily, Chrystine Oiler, MD, 372 mg at 09/23/22 0845  latanoprost (XALATAN) 0.005 % ophthalmic solution 1 drop, 1 drop, Both Eyes, Nightly, Al-Musa, Amer, MD, 1 drop at 09/22/22 2024  LORazepam (ATIVAN) tablet 0.5 mg, 0.5 mg, Oral, Q6H PRN, Chrystine Oiler, MD  [START ON 09/27/2022] mesna (MESNEX) 2,650 mg in sodium chloride 0.9%  500 mL infusion, 25 mg/kg (Treatment Plan Recorded), Intravenous, Q24H, Chrystine Oiler, MD  ondansetron (ZOFRAN-ODT) disintegrating tablet 16 mg, 16 mg, Translingual, Q12H, Chrystine Oiler, MD, 16 mg at 09/24/22 0628  [START ON 09/27/2022] potassium chloride 20 mEq, sodium bicarbonate 50 mEq in D5 1/2 NS 1,000 mL infusion, 150 mL/hr, Intravenous, Continuous, Chrystine Oiler, MD  prochlorperazine edisylate (COMPAZINE) injection 10 mg, 10 mg, IV Push, Q6H PRN, Chrystine Oiler, MD, 10 mg at 09/23/22 0021  sodium chloride 0.9 % (new bag) bolus 500 mL, 500 mL, Intravenous, Once PRN, Chrystine Oiler, MD  sodium chloride 0.9 % flush 10 mL, 10 mL, IV Push, PRN for Line Care, Chrystine Oiler, MD  sodium chloride 0.9 % flush 3 mL, 3 mL, IV Push, Q8H, Al-Musa, Amer, MD, 3 mL at 09/22/22 1509  sodium chloride 0.9 % flush 3 mL, 3 mL, IV Push, PRN for Line Care, Roetta Sessions, MD  sodium chloride 0.9% infusion, 5 mL/hr, Intravenous, PRN, Chrystine Oiler, MD  Melene Muller ON 09/29/2022] tacrolimus (PROGRAF) immediate release capsule 3 mg, 3 mg, Oral, Q12H (0600-1800), Chrystine Oiler, MD  ursodioL (URSO 250) tablet 500 mg, 500 mg, Oral, BID, Chrystine Oiler, MD, 500 mg at 09/23/22 2041Objective: Temp:  [97.9 ?F (36.6 ?C)-98.6 ?F (37 ?C)] 98.6 ?F (37 ?C)Pulse:  [73-86] 73Resp:  [18] 18BP: (102-132)/(63-81) 102/63SpO2:  [97 %-100 %] 97 %I/O's:No intake or output data in the 24 hours ending 09/24/22 0759Wt: 09/23/22 101.9 kg 09/16/22 104.2 kg 09/13/22 104.3 kg 09/11/22 105.9 kg 09/09/22 105.4 kg 09/04/22 106.4 kg Physical ExamConstitutional:     Appearance: Normal appearance. He is normal weight. He is not ill-appearing or diaphoretic. HENT:    Head: Normocephalic.    Nose: Nose normal.    Mouth/Throat:    Mouth: Mucous membranes are moist.    Pharynx: Oropharynx is clear. No oropharyngeal exudate. Eyes:    General: No scleral icterus.      Right eye: No discharge.       Left eye: No discharge. Cardiovascular:    Rate and Rhythm: Normal rate and regular rhythm.    Heart sounds: Normal heart sounds. No murmur heard.Pulmonary:    Effort: Pulmonary effort is normal. No respiratory distress.    Breath sounds: Normal breath sounds. No wheezing or rales. Abdominal:    General: Abdomen is flat. Bowel sounds are normal. There is no distension.    Palpations: Abdomen is soft.    Tenderness: There is no abdominal tenderness. There is no guarding. Musculoskeletal:       General: No swelling or deformity. Normal range of motion.    Right lower leg: No edema.    Left lower leg: No edema. Skin:   General: Skin is warm.    Findings: No rash.    Comments: Toes hyperpigmetned/?PVD - not newBruises, petechiae scattered Neurological:    Mental Status: He is alert and oriented to person, place, and time. Psychiatric:       Mood and Affect: Mood normal. Labs:Recent Results (from the past 24 hour(s)) CBC auto differential  Collection Time: 09/24/22  6:27 AM Result Value Ref Range  WBC <0.1 (L) 4.0 - 11.0 x1000/?L  RBC 2.84 (L) 4.00 - 6.00 M/?L  Hemoglobin 8.1 (L) 13.2 - 17.1 g/dL  Hematocrit 16.10 (L) 96.04 - 50.00 %  MCV 83.8 80.0 - 100.0 fL  MCH 28.5 27.0 - 33.0 pg  MCHC 34.0 31.0 - 36.0 g/dL  RDW-CV 54.0 98.1 - 19.1 %  Platelets 11 (L) 150 - 420 x1000/?L  MPV  Neutrophils    Lymphocytes    Monocytes    Eosinophils    Basophil    Immature Granulocytes    nRBC 0.0 0.0 - 1.0 %  Absolute Lymphocyte Count    Monocyte Absolute Count    Eosinophil Absolute Count    Basophil Absolute Count    Absolute Immature Granulocyte Count    Absolute nRBC 0.00 0.00 - 1.00 x 1000/?L  ANC (Abs Neutrophil Count) 0.01 (L) 2.00 - 7.60 x 1000/?L Immature Platelet Fraction (BH GH LMW YH)  Collection Time: 09/24/22  6:27 AM Result Value Ref Range  Immature Platelet Fraction 0.5 (L) 1.2 - 8.6 %  Absolute Immature Platelet Fraction 0.1 <20.0 x1000/?L Diagnostics:No results found.Assessment: MELTON FINERAN is a 72 y.o. 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 He is s/p 2 cycles of chemotherapy with Decitabine + Venetoclax with reduction in blast count. 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  Plan:  Day 0:  Conditioning/Transplant: CMML -Melphalan 70 mg/m2 over 30 minutes on D-6, *Melphalan dosed reduced to 70 mg/m2 due to age-Fludarabine 40 mg/m2 over 30 minutes every 24hr for 4 doses from D-5 to D -2             -PT cyclophosphamide 25 mg/kg IV over 1.5 hrs every 24hr for 2 doses on D +3 & D +4 (reduced by 50% to 25 mg/kg due 10/10 match and age)-Mesna 50 mg/kg CIVI daily, for 2 doses, starting 15 minutes before cyclophosphamide on D +3 & D +4-Stem cell infusion day 0 (09/24/22) HEME: - Transfuse pRBCs to maintain Hb >7 g/dL, platelets >08 or if clinically indicated- Filgrastim 87mcg/kg starting on day +5 until stable engraftment of neutrophils.- Will transfuse 1 unit of platelets today before stem cell transfusion  ID: Prophylactic antimicrobials: Acyclovir 400 mg PO TID (home medications), Ciprofloxacin 500 mg po twice daily, Isavuconazole 372 mg po daily (prior authorization pending, voriconazole allergy-gingival hyperplasia), Pentamidine 300 mg inhalation monthly- CMV -/pending, attempt to get letermovir for CMV prophylaxis. Will send script on Day +7 to determine insurance coverage before starting inpatient.Check CMV PCR weekly.- If febrile, panculture, UA and CXR, start broad spectrum empiric antibiotics if neutropenic or septic appearing.   - Repeat blood cx every 24 hours with temperature > 100.4 or septic appearing GVHD Prophylaxis:  - Tacrolimus  3mg  PO starting day +5.  Goal of 6-11ng/ml- PT cyclophosphamide 25 mg/kg IV over 1.5 hrs every 24hr for 2 doses on D +3 & D +4 with mesna daily on Day +3 and Day +4  (PTCy dose reduced by 50% )- Monitor levels, haptoglobin, LDH, triglycerides.- Daily skin exam, liver function tests twice weekly (minimum), document stool output/day. Fluids, Electrolytes and Nutrition:- Regular diet, consider nutrition consult if needed- Daily weights- Strict Intake and output- Replete electrolytes PRNEmesis prophylaxis: Ondansetron 16 mg PO every 12 hours w/ PRN Compazine, Dexamethasone 12mg  po premed prior to melphalan High dose cyclophosphamide: doxepin premed 25 mg and diphenhydramine 25 mg 30 mins before Cytoxan; D5W 1/2NS + KCL 20 meq + Magnesium 1 gm alternating with D5W + of NaCHO3 + KCL 20 meq until 24 hours after last Cyclophosphamide dose (hold fluid during Cytoxan infusion) VOD: - Continue Ursodiol 500mg  PO BID, continue until Day +90- Daily weights, strict fluid monitoring of all intake/output- Monitor liver function tests and coags at least twice weekly HTNPatient reports  not taking his antiHTN meds for the last few days due to feeling weak. - Will hold home antihypertensive meds for now - monitor BP and reorder as appropriate  CVAD: [facility] for chemotherapy and supportive care Discharge Planning: Pending count recovery 24 hour caregiver: Signed:Amer Al-Musa, MDPGY-2, Internal MedicineAvailable via MHB8/09/2022 BMT ATTENDING ADDENDUM: I saw and examined the patient. I discussed the case with the resident, Dr. Carolyne Littles and I agree with the history, physical assessment and plan. Please see my separate note for details.Sheryle Hail, MD

## 2022-09-25 ENCOUNTER — Encounter: Admit: 2022-09-25 | Payer: PRIVATE HEALTH INSURANCE

## 2022-09-25 LAB — CD34 STEM CELL PRODUCT, FLOW CYTOMETRY (ALLO/MUD)     (YH)
BKR  CD34+CELLS IN UNIT/PT WT (X10 EXPONENT 6): 2.58 x10Ë6 kg
BKR  CD34+CELLS IN UNIT/PT WT (X10 EXPONENT 6): 2.77 x10Ë6 kg
BKR ABSOLUTE CD3 (ALLO/MUD): 36940.2 ul
BKR ABSOLUTE CD3 (ALLO/MUD): 45683.8 ul
BKR ABSOLUTE CD3+CD4+ (ALLO/MUD): 19261.6 ul
BKR ABSOLUTE CD3+CD4+ (ALLO/MUD): 23334 ul
BKR ABSOLUTE CD3+CD8+ (ALLO/MUD): 15456.6 ul
BKR ABSOLUTE CD3+CD8+ (ALLO/MUD): 17997.2 ul
BKR ABSOLUTE CD34+: 1016.1 /uL
BKR ABSOLUTE CD34+: 870 /uL
BKR ABSOLUTE CD45+: 141437 /uL
BKR ABSOLUTE CD45+: 174991 /uL
BKR CD3+ CELLS IN UNIT/PT WT (X10 EXPONENT 6): 109.35 x10Ë6 kg
BKR CD3+ CELLS IN UNIT/PT WT (X10 EXPONENT 6): 124.39 x10Ë6 kg
BKR CD3+CD4+ CELLS IN UNIT/PT WT (X10 EXPONENT 6): 57.02 x10Ë6 kg
BKR CD3+CD4+ CELLS IN UNIT/PT WT (X10 EXPONENT 6): 63.54 x10Ë6 kg
BKR CD3+CD8+ CELLS IN UNIT/PT WT (X10 EXPONENT 6): 45.75 x10Ë6 kg
BKR CD3+CD8+ CELLS IN UNIT/PT WT (X10 EXPONENT 6): 49 x10Ë6 kg
BKR CD34 VIABILITY (%): 97.9 %
BKR CD34 VIABILITY (%): 98.6 %
BKR PATIENT WEIGHT (KG): 105.4 kg
BKR PATIENT WEIGHT (KG): 105.4 kg
BKR UNIT VOLUME: 287 ml
BKR UNIT VOLUME: 312 ml

## 2022-09-25 LAB — IMMATURE PLATELET FRACTION (BH GH LMW YH)
BKR WAM IPF, ABSOLUTE: 1.2 x1000/ÂµL (ref <20.0)
BKR WAM IPF: 2.5 % (ref 1.2–8.6)

## 2022-09-25 LAB — HEMATOPOIETIC PROGENITOR CELLS      (YH)
BKR HPC ABSOLUTE IMMATURE GRANULOCYTES: 2.21 x 1000/ÂµL
BKR HPC ABSOLUTE IMMATURE GRANULOCYTES: 2.62 x 1000/ÂµL
BKR HPC ABSOLUTE LYMPHOCYTE COUNT: 50.95 x 1000/ÂµL
BKR HPC ABSOLUTE LYMPHOCYTE COUNT: 60.32 x 1000/ÂµL
BKR HPC ABSOLUTE NRBC: 0.03 x 1000/ÂµL
BKR HPC ABSOLUTE NRBC: 0.04 x 1000/ÂµL
BKR HPC ANALYZER ANC: 28.8 x 1000/ÂµL
BKR HPC ANALYZER ANC: 36.9 x 1000/ÂµL
BKR HPC BASOPHIL ABSOLUTE COUNT: 0 x 1000/ÂµL
BKR HPC BASOPHIL ABSOLUTE COUNT: 0 x 1000/ÂµL
BKR HPC BASOPHILS: 0 %
BKR HPC BASOPHILS: 0 %
BKR HPC EOSINOPHIL ABSOLUTE COUNT: 0 x 1000/ÂµL
BKR HPC EOSINOPHIL ABSOLUTE COUNT: 0.02 x 1000/ÂµL
BKR HPC EOSINOPHILS: 0 %
BKR HPC EOSINOPHILS: 0 %
BKR HPC IMMATURE GRANULOCYTES: 1.3 %
BKR HPC IMMATURE GRANULOCYTES: 1.8 %
BKR HPC LYMPHOCYTES: 35.6 %
BKR HPC LYMPHOCYTES: 35.7 %
BKR HPC MONOCYTE ABSOLUTE COUNT: 60.47 x 1000/ÂµL
BKR HPC MONOCYTE ABSOLUTE COUNT: 70.1 x 1000/ÂµL
BKR HPC MONOCYTES: 41.3 %
BKR HPC MONOCYTES: 42.3 %
BKR HPC NEUTROPHILS: 20.2 %
BKR HPC NEUTROPHILS: 21.8 %
BKR HPC NUCLEATED RED BLOOD CELLS: 0 %
BKR HPC NUCLEATED RED BLOOD CELLS: 0 %
BKR HPC PLATELETS: 1153 x1000/ÂµL
BKR HPC PLATELETS: 1619 x1000/ÂµL
BKR HPC WHITE BLOOD CELL: 142.8 x1000/ÂµL
BKR HPC WHITE BLOOD CELL: 169.6 x1000/ÂµL

## 2022-09-25 LAB — CBC WITH AUTO DIFFERENTIAL
BKR WAM ABSOLUTE NRBC (2 DEC): 0 x 1000/ÂµL (ref 0.00–1.00)
BKR WAM ANC (ABSOLUTE NEUTROPHIL COUNT): 0.03 x 1000/ÂµL — ABNORMAL LOW (ref 2.00–7.60)
BKR WAM HEMATOCRIT (2 DEC): 22.2 % — ABNORMAL LOW (ref 38.50–50.00)
BKR WAM HEMOGLOBIN: 7.7 g/dL — ABNORMAL LOW (ref 13.2–17.1)
BKR WAM MCH (PG): 28.9 pg (ref 27.0–33.0)
BKR WAM MCHC: 34.7 g/dL (ref 31.0–36.0)
BKR WAM MCV: 83.5 fL (ref 80.0–100.0)
BKR WAM MPV: 12.6 fL — ABNORMAL HIGH (ref 8.0–12.0)
BKR WAM NUCLEATED RED BLOOD CELLS: 0 % (ref 0.0–1.0)
BKR WAM PLATELETS: 46 x1000/ÂµL — ABNORMAL LOW (ref 150–420)
BKR WAM RDW-CV: 14.5 % (ref 11.0–15.0)
BKR WAM RED BLOOD CELL COUNT.: 2.66 M/ÂµL — ABNORMAL LOW (ref 4.00–6.00)
BKR WAM WHITE BLOOD CELL COUNT: 0.2 x1000/ÂµL — ABNORMAL LOW (ref 4.0–11.0)

## 2022-09-25 LAB — COMPREHENSIVE METABOLIC PANEL
BKR A/G RATIO: 0.9 — ABNORMAL LOW (ref 1.0–2.2)
BKR ALANINE AMINOTRANSFERASE (ALT): 21 U/L (ref 9–59)
BKR ALBUMIN: 3.4 g/dL — ABNORMAL LOW (ref 3.6–5.1)
BKR ALKALINE PHOSPHATASE: 105 U/L (ref 9–122)
BKR ANION GAP: 12 (ref 7–17)
BKR ASPARTATE AMINOTRANSFERASE (AST): 26 U/L (ref 10–35)
BKR AST/ALT RATIO: 1.2
BKR BILIRUBIN TOTAL: 0.4 mg/dL (ref <=1.2)
BKR BLOOD UREA NITROGEN: 11 mg/dL (ref 8–23)
BKR BUN / CREAT RATIO: 12.1 (ref 8.0–23.0)
BKR CALCIUM: 8.8 mg/dL (ref 8.8–10.2)
BKR CHLORIDE: 106 mmol/L (ref 98–107)
BKR CO2: 21 mmol/L (ref 20–30)
BKR CREATININE: 0.91 mg/dL (ref 0.40–1.30)
BKR EGFR, CREATININE (CKD-EPI 2021): >60 mL/min/{1.73_m2} (ref >=60)
BKR GLOBULIN: 3.6 g/dL (ref 2.0–3.9)
BKR GLUCOSE: 93 mg/dL (ref 70–100)
BKR POTASSIUM: 3.7 mmol/L (ref 3.3–5.3)
BKR PROTEIN TOTAL: 7 g/dL (ref 5.9–8.3)
BKR SODIUM: 139 mmol/L (ref 136–144)

## 2022-09-25 LAB — LACTATE DEHYDROGENASE: BKR LACTATE DEHYDROGENASE: 394 U/L — ABNORMAL HIGH (ref 122–241)

## 2022-09-25 LAB — C. DIFFICILE ASSAY: BKR C. DIFFICILE TOXIN B DNA PCR: NEGATIVE

## 2022-09-25 LAB — PT/INR AND PTT (BH GH L LMW YH)
BKR INR: 1.11 (ref 0.86–1.12)
BKR PARTIAL THROMBOPLASTIN TIME: 30 s (ref 23.0–31.4)
BKR PROTHROMBIN TIME: 12.2 s (ref 9.6–12.3)

## 2022-09-25 LAB — MAGNESIUM: BKR MAGNESIUM: 2.1 mg/dL (ref 1.7–2.4)

## 2022-09-25 LAB — HAPTOGLOBIN: BKR HAPTOGLOBIN: 412 mg/dL — ABNORMAL HIGH (ref 30–200)

## 2022-09-25 MED ORDER — SODIUM CHLORIDE 0.9 % BOLUS (NEW BAG)
0.9 | Freq: Once | INTRAVENOUS | Status: CP
Start: 2022-09-25 — End: ?
  Administered 2022-09-25: 09:00:00 0.9 mL/h via INTRAVENOUS

## 2022-09-25 MED ORDER — LOPERAMIDE 2 MG CAPSULE
2 | Freq: Four times a day (QID) | ORAL | Status: CP | PRN
Start: 2022-09-25 — End: ?
  Administered 2022-09-26 – 2022-10-09 (×18): 2 mg via ORAL

## 2022-09-25 NOTE — Other
Malnutrition IdentificationPt meets criteria for Severe Malnutrition based on the following identified criteria:Weight Loss: Acute Illness/Injury: >7.5%/3 months Energy Intake:  Acute Illness/Injury: Less than or equal to 50% for greater than or equal to 5 daysElectronically signed by Cyril Loosen, RD, September 25, 2022

## 2022-09-25 NOTE — Progress Notes
Cell Therapy Infusion Summary Date of Infusion: 08/08/2024Product Type: Hematopoietic Progenitor Cell (HPC), ApheresisTotal volume infused: 599 mLTotal CD34+cells/kg infused:  5.35x10^6/kgProduct ID(s): [id number] Cell Processing Type: Fresh, No ManipulationDonor Type: AllogeneicRecipient ABO/Rh Type: A PositiveRecipient Antibody Screen and ID: Negative Donor Name/ MRN/ NMDP ID: GRID [id number] ABO/Rh Type:  A PositiveDonor Antibody Screen and ID: NegativeOther: Micros Pending

## 2022-09-25 NOTE — Plan of Care
Plan of Care Overview/ Patient Status    1500 - 1900Allo D1Alert and oriented x4. Calm and cooperative with care during shift. Afebrile, otherwise VSS in room air. Right DL hickman, dressing c/d/I, flushing without difficulty, positive blood return. Capped. LBM 8/9, voiding spontaneously. No c/o n/v/d.  No c/o pain. Patient is independent. Call bell and personal belongings within reach. Turns and repositions independently q2h. Safety precautions maintained.

## 2022-09-25 NOTE — Progress Notes
NMDP Information for InfusionAllogeneicNMDP Recipient ID: [id number] Donor ID: GRID [id number] Type:  HPC,Apheresis Cell Dose CD34+/kg: 5.35x10^6/kgNumber of Bags: 2Product DIN:[id number] [id number] 24 000543 Volume: W2350 24 000542 - 312 mLW2350 24 000543 - 287 mLTotal - 599 mL

## 2022-09-25 NOTE — Plan of Care
Douglas [name]					Location: NP 11/[id number] y.o., male				Attending: Sheryle Hail, MD	Admit Date: 09/18/2022			[id number] LOS: 7 days INITIAL NUTRITION ASSESSMENTDIET ORDER: Regular dietNutrition Supplements: Ensure HP PRN VanillaANTHROPOMETRICS:Height: 71Admit wt: 102.9 kg 	   Current wt: 102.3kg   BMI: 31.46Dosing weight: 86kg ([place] +10%)Additional wt information:Pt confirmed ht of 5'11 (71). Pt with significnt wt loss of 10.2 kg (9%) within the last 3 months. No edema per flowsheets.  Will monitor wt trends.Lowest wt for each month available in EMR over the last year:09/25/22	102.3 kg07/31/24	104.2 kg06/26/24	111 kg05/22/24	112.5 kg04/12/24	112.8 kgNFPE:No overt depletion obsevedSKIN: No skin related changes that require adjustment to nutrition estimated needs.  ESTIMATED NUTRITION REQUIREMENTS:Kcal/day: 2580	(30kcal/kg)Protein/day: 103- 129	(1.2- 1.5gm/kg)Fluids/day: Fluid management per medical team discretion. 2580 mL (26mL/kcal) Needs based on: Dosing wt (86kg), AML, Allo SCT (day 0= 8/8) NUTRITION ASSESSMENT: Chart reviewed for reduced oral intake trigger. Meds and labs reviewed. Pt noted to have poor po intake prior to SCT on 8/8. Met with pt at bedside, pt reports poor appetite and intake significantly decreased from baseline. He reports low intake over the last 5 days. Intake today consisted of pudding, 50% of a cream of wheat, and 1 Ensure clear with a cranberry Juice. Nausea and loss of appetite identified as most prominent barrier to nutrition intake. Pt is keeping snacks at bedside to eat if appetite is present. Provided pt with post allo SCT nutrition education. No questions at this time. Pt is falling short of nutrition targets. Current diet and supplements are appropriate, will order RMK milkshakes to start on 8/10. Will follow throughout admission. Meds:Zofran q12, PRN compazineLabs: lytes wnlGI: LBM 8/9 (type 7)Cultural/Religious/Ethnic Needs: NoFood Allergies:NKFANUTRITION DIAGNOSIS:Severe malnutrition related to acute illness as evidenced by energy intake < 50% for > 5 days and wt loss > 7.5%/ 3 months INTERVENTIONS/RECOMMENDATIONS: Meals and Snacks:- Regular diet with bottled water and PC juices only, Coffee/ tea OK- Small, frequent mealsGoal:   Patient to consume an average of > 50-75% meals prior to next nutrition assessment Nutrition Supplement Therapy:- Continue Vanilla Ensure HP PRN- Will order RMK Milkshake  consisting of 8oz whole milk, 3 Strawberry ice creams, and 3 scoops beneprotein (614kcal, 32g protein)Goal:  Patient to consume an average of 1- 2 oral nutrition supplements/day prior to next nutrition assessment Nutrition Education (8/9): - A Guide to Good Nutrition	Provided education material, pt prefers to review on his own and will reach out with questions if needed.Goal:  Patient able to identify 2 principals of food safety prior to next nutrition assessmentDischarge Planning and Transfer of Nutrition Care:  Nutrition related discharge needs still being determined at this time, will continue to follow  MONITORING / EVALUATION:Food/Nutrition-Related History:Food and Nutrient IntakeEnteral and Parenteral Nutrition IntakeAnthropometric Measurements:Weight Biochemical Data, Medical Tests and Procedures:Electrolyte and renal profileGlucose/endocrine profileNutrition-Focus Physical FindingsOverall findingsElectronically signed by Cyril Loosen, RD, August 9, 2024[facility] Dynamic Role - Nutrition Service - [facility] Adult Service Please note: Nutrition is a consult only service on weekends and holidays.  Please enter a consult in EPIC if assistance is needed on a weekend or holiday or via [facility] Dynamic Role as ?Food & Nutrition Adult Weekend Dietitian [facility]? to contact the covering RD.?

## 2022-09-25 NOTE — Progress Notes
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 Has not taken compazine yet. Had a loose BM this AM, c diff -ve. Review of Systems Constitutional: Negative.  Negative for chills, fever and malaise/fatigue. HENT: Negative.  Negative for sore throat.  Eyes: Negative.  Respiratory: Negative.  Negative for cough, sputum production and shortness of breath.  Cardiovascular: Negative.  Negative for chest pain, palpitations and leg swelling. Gastrointestinal: Negative.  Negative for abdominal pain, constipation, diarrhea, nausea and vomiting. Genitourinary:  Negative for dysuria, frequency and urgency. Musculoskeletal: Negative.  Skin: Negative.  Negative for itching and rash.      Toes discolored - not new Neurological: Negative.  Psychiatric/Behavioral: Negative.   Review of Allergies/Meds/Hx: Allergies Allergen Reactions  Voriconazole Other (See Comments)   Gingival edema/redness making it difficult to bite down  Medications:Current Facility-Administered Medications:   acyclovir (ZOVIRAX) capsule 400 mg, 400 mg, Oral, Q8H, Chrystine Oiler, MD, 400 mg at 09/25/22 0452  albuterol neb sol 2.5 mg/3 mL (0.083%) (PROVENTIL,VENTOLIN), 2.5 mg, Nebulization, Q30 MIN PRN, Chrystine Oiler, MD  chlorhexidine gluconate (PERIDEX) 0.12 % solution 15 mL, 15 mL, Mouth/Throat, BID, Carolyne Littles, Tyrah Broers, MD, 15 mL at 09/24/22 2149  ciprofloxacin HCl (CIPRO) tablet 500 mg, 500 mg, Oral, Q12H, Chrystine Oiler, MD, 500 mg at 09/24/22 2149  [START ON 09/27/2022] cycloPHOSphamide (CYTOXAN) 2,640 mg in sodium chloride 0.9% 500 mL infusion, 25 mg/kg (Treatment Plan Recorded), Intravenous, Q24H, Chrystine Oiler, MD  [START ON 09/26/2022] D5 1/2 NS 1,000 mL with potassium chloride 20 mEq, magnesium sulfate 1 g infusion, 150 mL/hr, Intravenous, Continuous, Chrystine Oiler, MD  [START ON 09/27/2022] diphenhydrAMINE (BENADRYL) injection 25 mg, 25 mg, IV Push, Q24H, Chrystine Oiler, MD  diphenhydrAMINE (BENADRYL) injection 50 mg, 50 mg, IV Push, Once PRN, Chrystine Oiler, MD  Melene Muller ON 09/27/2022] doxepin (SINEquan) capsule 25 mg, 25 mg, Oral, Q24H, Chrystine Oiler, MD  EPINEPHrine (EPI-PEN) auto-injector 0.3 mg, 0.3 mg, Intramuscular, Q15 MIN PRN, Chrystine Oiler, MD  EPINEPHrine (EPI-PEN) auto-injector 0.3 mg, 0.3 mg, Intramuscular, Q15 MIN PRN, Chrystine Oiler, MD  famotidine (PF) (PEPCID) 20 mg in sodium chloride 0.9% PF 5 mL Injection, 20 mg, IV Push, Once PRN, Chrystine Oiler, MD  Melene Muller ON 09/29/2022] filgrastim-sndz (ZARXIO) syringe 480 mcg, 5 mcg/kg (Treatment Plan Recorded), Subcutaneous, Daily (1700), Chrystine Oiler, MD  finasteride (PROSCAR) tablet 5 mg, 5 mg, Oral, Daily, Al-Musa, Torey Reinard, MD, 5 mg at 09/24/22 0935  hydrocortisone sodium succinate (PF) (Solu-CORTEF) injection 100 mg, 100 mg, IV Push, Once PRN, Chrystine Oiler, MD  isavuconazonium (Cresemba) capsule Cap 372 mg, 372 mg, Oral, Daily, Chrystine Oiler, MD, 372 mg at 09/24/22 0935  latanoprost (XALATAN) 0.005 % ophthalmic solution 1 drop, 1 drop, Both Eyes, Nightly, Al-Musa, Arwa Yero, MD, 1 drop at 09/24/22 2156  LORazepam (ATIVAN) tablet 0.5 mg, 0.5 mg, Oral, Q6H PRN, Chrystine Oiler, MD  [START ON 09/27/2022] mesna (MESNEX) 2,650 mg in sodium chloride 0.9% 500 mL infusion,  25 mg/kg (Treatment Plan Recorded), Intravenous, Q24H, Chrystine Oiler, MD  ondansetron (ZOFRAN-ODT) disintegrating tablet 16 mg, 16 mg, Translingual, Q12H, Chrystine Oiler, MD, 16 mg at 09/25/22 0452  [START ON 09/27/2022] potassium chloride 20 mEq, sodium bicarbonate 50 mEq in D5 1/2 NS 1,000 mL infusion, 150 mL/hr, Intravenous, Continuous, Chrystine Oiler, MD  prochlorperazine edisylate (COMPAZINE) injection 10 mg, 10 mg, IV Push, Q6H PRN, Chrystine Oiler, MD, 10 mg at 09/23/22 0021  sodium chloride 0.9 % (new bag) bolus 500 mL, 500 mL, Intravenous, Once PRN, Chrystine Oiler, MD  sodium chloride 0.9 % flush 10 mL, 10 mL, IV Push, PRN for Line Care, Chrystine Oiler, MD  sodium chloride 0.9 % flush 3 mL, 3 mL, IV Push, Q8H, Al-Musa, Bilaal Leib, MD, 3 mL at 09/22/22 1509  sodium chloride 0.9 % flush 3 mL, 3 mL, IV Push, PRN for Line Care, Roetta Sessions, MD  sodium chloride 0.9% infusion, 5 mL/hr, Intravenous, PRN, Chrystine Oiler, MD  Melene Muller ON 09/29/2022] tacrolimus (PROGRAF) immediate release capsule 3 mg, 3 mg, Oral, Q12H (0600-1800), Chrystine Oiler, MD  ursodioL (URSO 250) tablet 500 mg, 500 mg, Oral, BID, Chrystine Oiler, MD, 500 mg at 09/24/22 2149Objective: Temp:  [97.2 ?F (36.2 ?C)-98.5 ?F (36.9 ?C)] 98.2 ?F (36.8 ?C)Pulse:  [71-112] 78Resp:  [18-19] 19BP: (119-152)/(65-80) 133/68SpO2:  [95 %-100 %] 99 %I/O's:Gross Totals (Last 24 hours) at 09/25/2022 0818Last data filed at 09/25/2022 0513Intake 2560 ml Output 175 ml Net 2385 ml Wt: 09/25/22 102.3 kg 09/16/22 104.2 kg 09/13/22 104.3 kg 09/11/22 105.9 kg 09/09/22 105.4 kg 09/04/22 106.4 kg Physical ExamConstitutional:     Appearance: Normal appearance. He is normal weight. He is not ill-appearing or diaphoretic. HENT:    Head: Normocephalic.    Nose: Nose normal.    Mouth/Throat:    Mouth: Mucous membranes are moist.    Pharynx: Oropharynx is clear. No oropharyngeal exudate. Eyes:    General: No scleral icterus.      Right eye: No discharge.       Left eye: No discharge. Cardiovascular:    Rate and Rhythm: Normal rate and regular rhythm.    Heart sounds: Normal heart sounds. No murmur heard.Pulmonary:    Effort: Pulmonary effort is normal. No respiratory distress.    Breath sounds: Normal breath sounds. No wheezing or rales. Abdominal:    General: Abdomen is flat. Bowel sounds are normal. There is no distension.    Palpations: Abdomen is soft.    Tenderness: There is no abdominal tenderness. There is no guarding. Musculoskeletal:       General: No swelling or deformity. Normal range of motion.    Right lower leg: No edema.    Left lower leg: No edema. Skin:   General: Skin is warm.    Findings: No rash.    Comments: Toes hyperpigmetned/?PVD - not newBruises, petechiae scattered Neurological:    Mental Status: He is alert and oriented to person, place, and time. Psychiatric:       Mood and Affect: Mood normal. Labs:Recent Results (from the past 24 hour(s)) CD34 stem cell product, flow cytometry (ALLO/MUD)     [facility])  Collection Time: 09/24/22  7:58 PM Result Value Ref Range  Apheresis Unit # [id number]   Unit Volume 312 ml  MRN Intended Recipient [id number]   Patient Weight (kg) 105.4 kg  CD34 Viability (%) 97.9 %  Absolute CD45+ 141,437 /uL  CD34+Cells in Unit/Pt Wt (x10 exponent 6) 2.58 x10?6 kg  Absolute CD34+ 870.0 /uL  Absolute  CD3 (Allo/MUD) 36,940.2 ul  CD3+Cells in Unit/Pt Wt (x10 exponent 6) 109.35 x10?6 kg  Absolute CD3+CD4+ (Allo/MUD) 19,261.6 ul  CD3+CD4+ Cells in Unit/Pt Wt (X10 exponent 6) 57.02 x10?6 kg  Absolute CD3+CD8+ (Allo/MUD) 15,456.6 ul  CD3+CD8+ Cells in Unit/Pt Wt (X10 exponent 6) 45.75 x10?6 kg Hematopoietic progenitor cells      [facility])  Collection Time: 09/24/22  8:02 PM Result Value Ref Range  WBC, HPC 142.8 x1000/?L  Platelets, HPC 1,619.0 x1000/?L  ANC (Abs Neutrophil Count), HPC 28.8 x 1000/?L  Neutrophils, HPC 20.2 %  Immature Granulocytes, HPC 1.8 %  Lymphocytes, HPC 35.7 %  Monocytes, HPC 42.3 %  Eosinophils, HPC 0.0 %  Basophil, HPC 0.0 %  Absolute Immature Granulocyte Count, HPC 2.62 x 1000/?L  Absolute Lymphocyte Count, HPC 50.95 x 1000/?L  Monocyte Absolute Count, HPC 60.47 x 1000/?L  Eosinophil Absolute Count, HPC 0.02 x 1000/?L  Basophil Absolute Count, HPC 0.00 x 1000/?L  nRBC, HPC 0.0 %  Absolute nRBC, HPC 0.03 x 1000/?L Hematopoietic progenitor cells      [facility])  Collection Time: 09/24/22  8:02 PM Result Value Ref Range  WBC, HPC 169.6 x1000/?L  Platelets, HPC 1,153.0 x1000/?L  ANC (Abs Neutrophil Count), HPC 36.9 x 1000/?L  Neutrophils, HPC 21.8 %  Immature Granulocytes, HPC 1.3 %  Lymphocytes, HPC 35.6 %  Monocytes, HPC 41.3 %  Eosinophils, HPC 0.0 %  Basophil, HPC 0.0 %  Absolute Immature Granulocyte Count, HPC 2.21 x 1000/?L  Absolute Lymphocyte Count, HPC 60.32 x 1000/?L  Monocyte Absolute Count, HPC 70.10 x 1000/?L  Eosinophil Absolute Count, HPC 0.00 x 1000/?L  Basophil Absolute Count, HPC 0.00 x 1000/?L  nRBC, HPC 0.0 %  Absolute nRBC, HPC 0.04 x 1000/?L CD34 stem cell product, flow cytometry (ALLO/MUD)     [facility])  Collection Time: 09/24/22  8:02 PM Result Value Ref Range  Apheresis Unit # [id number]   Unit Volume 287 ml  MRN Intended Recipient [id number]   Patient Weight (kg) 105.4 kg  CD34 Viability (%) 98.6 %  Absolute CD45+ 174,991 /uL  CD34+Cells in Unit/Pt Wt (x10 exponent 6) 2.77 x10?6 kg  Absolute CD34+ 1,016.1 /uL  Absolute CD3 (Allo/MUD) 45,683.8 ul  CD3+Cells in Unit/Pt Wt (x10 exponent 6) 124.39 x10?6 kg  Absolute CD3+CD4+ (Allo/MUD) 23,334 ul  CD3+CD4+ Cells in Unit/Pt Wt (X10 exponent 6) 63.54 x10?6 kg  Absolute CD3+CD8+ (Allo/MUD) 17,997.2 ul  CD3+CD8+ Cells in Unit/Pt Wt (X10 exponent 6) 49.00 x10?6 kg Type and screen  Collection Time: 09/25/22  4:50 AM Result Value Ref Range  ABO Grouping A   Rh Type POS   Antibody Screen NEG   Specimen Expiration Date And Time 09/28/2022 23:59  Magnesium  Collection Time: 09/25/22  4:50 AM Result Value Ref Range  Magnesium 2.1 1.7 - 2.4 mg/dL CBC auto differential  Collection Time: 09/25/22  4:50 AM Result Value Ref Range  WBC 0.2 (L) 4.0 - 11.0 x1000/?L  RBC 2.66 (L) 4.00 - 6.00 M/?L  Hemoglobin 7.7 (L) 13.2 - 17.1 g/dL  Hematocrit 86.57 (L) 84.69 - 50.00 %  MCV 83.5 80.0 - 100.0 fL  MCH 28.9 27.0 - 33.0 pg  MCHC 34.7 31.0 - 36.0 g/dL  RDW-CV 62.9 52.8 - 41.3 %  Platelets 46 (L) 150 - 420 x1000/?L  MPV 12.6 (H) 8.0 - 12.0 fL  Neutrophils    Lymphocytes    Monocytes    Eosinophils    Basophil    Immature Granulocytes    nRBC  0.0 0.0 - 1.0 %  Absolute Lymphocyte Count    Monocyte Absolute Count    Eosinophil Absolute Count    Basophil Absolute Count    Absolute Immature Granulocyte Count    Absolute nRBC 0.00 0.00 - 1.00 x 1000/?L  ANC (Abs Neutrophil Count) 0.03 (L) 2.00 - 7.60 x 1000/?L Comprehensive metabolic panel  Collection Time: 09/25/22  4:50 AM Result Value Ref Range  Sodium 139 136 - 144 mmol/L  Potassium 3.7 3.3 - 5.3 mmol/L  Chloride 106 98 - 107 mmol/L  CO2 21 20 - 30 mmol/L  Anion Gap 12 7 - 17  Glucose 93 70 - 100 mg/dL  BUN 11 8 - 23 mg/dL  Creatinine 1.61 0.96 - 1.30 mg/dL  Calcium 8.8 8.8 - 04.5 mg/dL  BUN/Creatinine Ratio 40.9 8.0 - 23.0  Total Protein 7.0 5.9 - 8.3 g/dL  Albumin 3.4 (L) 3.6 - 5.1 g/dL  Total Bilirubin 0.4 <=8.1 mg/dL  Alkaline Phosphatase 191 9 - 122 U/L  Alanine Aminotransferase (ALT) 21 9 - 59 U/L  Aspartate Aminotransferase (AST) 26 10 - 35 U/L  Globulin 3.6 2.0 - 3.9 g/dL  A/G Ratio 0.9 (L) 1.0 - 2.2  AST/ALT Ratio 1.2 Reference Range Not Established  eGFR (Creatinine) >60 >=60 mL/min/1.45m2 Immature Platelet Fraction ([facility])  Collection Time: 09/25/22  4:50 AM Result Value Ref Range  Immature Platelet Fraction 2.5 1.2 - 8.6 %  Absolute Immature Platelet Fraction 1.2 <20.0 x1000/?L Diagnostics:No results found.Assessment: Douglas [name] is a 72 y.o. 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 He is s/p 2 cycles of chemotherapy with Decitabine + Venetoclax with reduction in blast count. 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  Plan:  Day +1:  Conditioning/Transplant: CMML -Melphalan 70 mg/m2 over 30 minutes on D-6, *Melphalan dosed reduced to 70 mg/m2 due to age-Fludarabine 40 mg/m2 over 30 minutes every 24hr for 4 doses from D-5 to D -2             -PT cyclophosphamide 25 mg/kg IV over 1.5 hrs every 24hr for 2 doses on D +3 & D +4 (reduced by 50% to 25 mg/kg due 10/10 match and age)-Mesna 50 mg/kg CIVI daily, for 2 doses, starting 15 minutes before cyclophosphamide on D +3 & D +4-Stem cell infusion day 0 (09/24/22) HEME: - Transfuse pRBCs to maintain Hb >7 g/dL, platelets >47 or if clinically indicated- Filgrastim 48mcg/kg starting on day +5 until stable engraftment of neutrophils.- Will transfuse 1 unit of platelets today before stem cell transfusion  ID: Prophylactic antimicrobials: Acyclovir 400 mg PO TID (home medications), Ciprofloxacin 500 mg po twice daily, Isavuconazole 372 mg po daily (prior authorization pending, voriconazole allergy-gingival hyperplasia), Pentamidine 300 mg inhalation monthly- CMV -/pending, attempt to get letermovir for CMV prophylaxis. Will send script on Day +7 to determine insurance coverage before starting inpatient.Check CMV PCR weekly.- If febrile, panculture, UA and CXR, start broad spectrum empiric antibiotics if neutropenic or septic appearing.   - Repeat blood cx every 24 hours with temperature > 100.4 or septic appearing GVHD Prophylaxis:  - Tacrolimus  3mg  PO starting day +5.  Goal of 6-11ng/ml- PT cyclophosphamide 25 mg/kg IV over 1.5 hrs every 24hr for 2 doses on D +3 & D +4 with mesna daily on Day +3 and Day +4  (PTCy dose reduced by 50% )-  Monitor levels, haptoglobin, LDH, triglycerides.- Daily skin exam, liver function tests twice weekly (minimum), document stool output/day. Fluids, Electrolytes and Nutrition:- Regular diet, consider nutrition consult if needed- Daily weights- Strict Intake and output- Replete electrolytes PRNEmesis prophylaxis: Ondansetron 16 mg PO every 12 hours w/ PRN Compazine, Dexamethasone 12mg  po premed prior to melphalan High dose cyclophosphamide: doxepin premed 25 mg and diphenhydramine 25 mg 30 mins before Cytoxan; D5W 1/2NS + KCL 20 meq + Magnesium 1 gm alternating with D5W + of NaCHO3 + KCL 20 meq until 24 hours after last Cyclophosphamide dose (hold fluid during Cytoxan infusion)Diarrhea: imodium 2mg  PRN 4 times daily VOD: - Continue Ursodiol 500mg  PO BID, continue until Day +90- Daily weights, strict fluid monitoring of all intake/output- Monitor liver function tests and coags at least twice weekly HTNPatient reports not taking his antiHTN meds for the last few days due to feeling weak. - Will hold home antihypertensive meds for now - monitor BP and reorder as appropriate  CVAD: [facility] for chemotherapy and supportive care Discharge Planning: Pending count recovery 24 hour caregiver: Signed:Anaston Koehn Al-Musa, MDPGY-2, Internal MedicineAvailable via MHB8/10/2022

## 2022-09-25 NOTE — Plan of Care
Plan of Care Overview/ Patient Status    12a-7aReceived patient awake and resting in his bed. His cells infusion finished and given by the previous nurse.He's a/ox4, ambulatory and independent.VSS. No fever, no pain and no n/v noted. Slept on and off. Morning labs done. OrthoVS done. No dizziness noted. MD notified. IVF NS 500cc bolus given as ordered. Will continue to monitor.

## 2022-09-25 NOTE — Progress Notes
SOCIAL WORK NOTEPatient Name: Douglas Dinovo BennettMedical Record Number: [id number] Date of Birth: [birth year]Medical Social Work Follow Up  [employer] Most Recent Value Admission Information  Document Type Progress Note Source of Information Patient Record Reviewed Yes Level of Care Ambulatory What medium(s) of communication were used with patient/family/caregiver? Face-to-Face / In-Person, Telephone Psychosocial issues requiring intervention Suites Psychosocial interventions 15 minutes in visits with [name] - face to face with [name] on np11 getting the Suites form that his wife had left for me,  and by phone with [name] clarifying the information on the [employer] application. I advised them that I am submitting the application for the [employer] grant and an application for the discount and I will get back to them when a decision is received. They thanked me for the assistance. [facility] hematology Specific referrals to enhance community supports (include existing and new resources) above Handoff Required? No Next Steps/Plan (including hand-off): Social work intervention complete. Please reconsult social work if needed. Signature: Huntley Estelle, LCSW Contact Information: [phone]

## 2022-09-25 NOTE — Plan of Care
Plan of Care Overview/ Patient Status    1500-2300: Patient received alert and oriented. Day zero Allo MUD today, awaiting stem cell arrival and infusion for this evening. VSS, afebrile. Patient says he is feeling well today and is just a little bit anxious for his infusion. His po intake is poor, encouraged to eat dinner. He denies NVD this shift. R dbl hickman intact and patent, claves changed. Denies pain.Add.: Stem cell infusion began at 2257 and completed at 2333. Two bags of fresh stem cells were infused via hickman. First bag was 312 ml and the second bag was 287 ml. Pre medicated with 650 tylenol and 25 ivp benadryl as ordered prior to infusion. Vital signs remained stable throughout infusion and patient tolerated both bags well with no s/s adverse reaction. Stem cell bags left at bedside for observation period, oncoming RN to be made aware to discard. Voiding spontaneously in urinals. Remains on room air. No acute distress. All safety precautions maintained, call bell within reach. Will cont to monitor.

## 2022-09-25 NOTE — Progress Notes
Spoke with Dr. Lenard Galloway at 2125 on 09/24/2022 regarding the 2 day collection from donor GRID [id number]. The first day yielded a CD34+ cell dose of 2.58x10^6/kg and the second day yielded a CD34+ cell dose of 2.77x10^6/kg for a total CD34+ cell dose of 5.35x10^6/kg. Dr. Lenard Galloway would like the full CD34+ cell dose of 5.35x10^6/kg to be infused fresh.

## 2022-09-25 NOTE — Plan of Care
Plan of Care Overview/ Patient Status    0700-1530Patient is Day +1 s/p a MUD SCT for his AML.He is alert and oriented times four and independent in his room today. He complained of no  appetite and was given compazine 10 mg IV and went to bed. He woke up feeling better. He was able to drink an apple clear ensure with cranberry juice.He was out of bed to the chair in the morning. He was afebrile so far today on ciprofloxacin,acyclovor,and cresemba. He is on fall,neutropenic,and protective precautions for his safety.

## 2022-09-26 ENCOUNTER — Ambulatory Visit: Admit: 2022-09-26 | Payer: MEDICARE

## 2022-09-26 LAB — COMPREHENSIVE METABOLIC PANEL
BKR A/G RATIO: 0.9 — ABNORMAL LOW (ref 1.0–2.2)
BKR ALANINE AMINOTRANSFERASE (ALT): 19 U/L (ref 9–59)
BKR ALBUMIN: 3.1 g/dL — ABNORMAL LOW (ref 3.6–5.1)
BKR ALKALINE PHOSPHATASE: 97 U/L (ref 9–122)
BKR ANION GAP: 12 (ref 7–17)
BKR ASPARTATE AMINOTRANSFERASE (AST): 19 U/L (ref 10–35)
BKR AST/ALT RATIO: 1
BKR BILIRUBIN TOTAL: 0.5 mg/dL (ref <=1.2)
BKR BLOOD UREA NITROGEN: 8 mg/dL (ref 8–23)
BKR BUN / CREAT RATIO: 9.4 (ref 8.0–23.0)
BKR CALCIUM: 8.7 mg/dL — ABNORMAL LOW (ref 8.8–10.2)
BKR CHLORIDE: 105 mmol/L (ref 98–107)
BKR CO2: 21 mmol/L (ref 20–30)
BKR CREATININE: 0.85 mg/dL (ref 0.40–1.30)
BKR EGFR, CREATININE (CKD-EPI 2021): >60 mL/min/{1.73_m2} (ref >=60)
BKR GLOBULIN: 3.6 g/dL (ref 2.0–3.9)
BKR GLUCOSE: 90 mg/dL (ref 70–100)
BKR POTASSIUM: 3.5 mmol/L (ref 3.3–5.3)
BKR PROTEIN TOTAL: 6.7 g/dL (ref 5.9–8.3)
BKR SODIUM: 138 mmol/L (ref 136–144)

## 2022-09-26 LAB — CHROMOSOME ANALYSIS (CYTOGENETICS) (YMG): BKR BANDING RESOLUTION: 400

## 2022-09-26 LAB — CBC WITH AUTO DIFFERENTIAL
BKR WAM ABSOLUTE NRBC (2 DEC): 0 x 1000/ÂµL (ref 0.00–1.00)
BKR WAM ANC (ABSOLUTE NEUTROPHIL COUNT): 0.12 x 1000/ÂµL — ABNORMAL LOW (ref 2.00–7.60)
BKR WAM HEMATOCRIT (2 DEC): 21.5 % — ABNORMAL LOW (ref 38.50–50.00)
BKR WAM HEMOGLOBIN: 7.3 g/dL — ABNORMAL LOW (ref 13.2–17.1)
BKR WAM MCH (PG): 28.5 pg (ref 27.0–33.0)
BKR WAM MCHC: 34 g/dL (ref 31.0–36.0)
BKR WAM MCV: 84 fL (ref 80.0–100.0)
BKR WAM NUCLEATED RED BLOOD CELLS: 0 % (ref 0.0–1.0)
BKR WAM PLATELETS: 14 x1000/ÂµL — ABNORMAL LOW (ref 150–420)
BKR WAM RDW-CV: 14.7 % (ref 11.0–15.0)
BKR WAM RED BLOOD CELL COUNT.: 2.56 M/ÂµL — ABNORMAL LOW (ref 4.00–6.00)
BKR WAM WHITE BLOOD CELL COUNT: 0.2 x1000/ÂµL — ABNORMAL LOW (ref 4.0–11.0)

## 2022-09-26 LAB — IMMATURE PLATELET FRACTION (BH GH LMW YH)
BKR WAM IPF, ABSOLUTE: 0.2 x1000/ÂµL (ref <20.0)
BKR WAM IPF: 1.1 % — ABNORMAL LOW (ref 1.2–8.6)

## 2022-09-26 MED ORDER — PIPERACILLIN-TAZOBACTAM (ZOSYN) 4.5GM MBP
Freq: Four times a day (QID) | INTRAVENOUS | Status: CP
Start: 2022-09-26 — End: ?
  Administered 2022-09-27 – 2022-10-07 (×40): 100.000 mL/h via INTRAVENOUS

## 2022-09-26 MED ORDER — POTASSIUM CHLORIDE 20 MEQ/100ML IN STERILE WATER INTRAVENOUS PIGGYBACK
20 | INTRAVENOUS | Status: AC | PRN
Start: 2022-09-26 — End: ?
  Administered 2022-09-26 – 2022-10-10 (×17): 20 mL/h via INTRAVENOUS

## 2022-09-26 MED ORDER — POTASSIUM CHLORIDE 20 MEQ/50 ML IN STERILE WATER INTRAVENOUS PIGGYBACK
20 | INTRAVENOUS | Status: CP | PRN
Start: 2022-09-26 — End: ?

## 2022-09-26 MED ORDER — VANCOMYCIN 1.5 G IN 500 ML IVPB (VIALMATE)
Freq: Two times a day (BID) | INTRAVENOUS | Status: DC
Start: 2022-09-26 — End: 2022-09-27
  Administered 2022-09-27 (×2): 500.000 mL/h via INTRAVENOUS

## 2022-09-26 MED ORDER — NORMAL SALINE ORAL RINSE
0.9 % | Freq: Four times a day (QID) | ORAL | Status: AC | PRN
Start: 2022-09-26 — End: ?

## 2022-09-26 MED ORDER — POTASSIUM CHLORIDE ER 20 MEQ TABLET,EXTENDED RELEASE(PART/CRYST)
20 | ORAL | Status: DC | PRN
Start: 2022-09-26 — End: 2022-09-26

## 2022-09-26 NOTE — Plan of Care
Plan of Care Overview/ Patient Status    0700-1530Patient is Day +2 s/p a MUD SCT for his AML.He is alert and oriented times four and independent in his room today.His wife was at the bedside for support making him a milkshake with berry clear ensure. He was replaced potassium chloride per sliding scale for a level of 3.5. He was c.diff negative and asked for one imodium. He was afebrile so far on ciprofloxacin,acyclovir,and cresemba. His wife noticed a sweat rash on his back after his shower and the the MD was notified. He is on fall,neutropenic,and protective precautions for his safety. His temp max was 99.8 at 1600.

## 2022-09-26 NOTE — Plan of Care
Plan of Care Overview/ Patient Status    1900-0700This shift, Mr. Panno was AOx4, afebrile, independent OOB, on room air.RDL Hickman patent with +BR maintained, dressing c/d/i.No c/o pain/n/v/d/c.Takes pills whole with water, voiding spontaneously.Fall/safety precautions maintained, call bell/belongings within reach, turns and repositions self.Please see flowsheet and plan of care for additional information.Marlin Canary, RN

## 2022-09-27 ENCOUNTER — Inpatient Hospital Stay: Admit: 2022-09-27 | Payer: MEDICARE

## 2022-09-27 DIAGNOSIS — C931 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: Secondary | ICD-10-CM

## 2022-09-27 DIAGNOSIS — C9200 Acute myeloblastic leukemia, not having achieved remission: Secondary | ICD-10-CM

## 2022-09-27 LAB — URINALYSIS-MACROSCOPIC W/REFLEX MICROSCOPIC
BKR BILIRUBIN, UA: NEGATIVE
BKR BILIRUBIN, UA: NEGATIVE
BKR BLOOD, UA: NEGATIVE
BKR GLUCOSE, UA: NEGATIVE
BKR GLUCOSE, UA: NEGATIVE
BKR KETONES, UA: NEGATIVE
BKR KETONES, UA: NEGATIVE
BKR LEUKOCYTE ESTERASE, UA: NEGATIVE
BKR LEUKOCYTE ESTERASE, UA: NEGATIVE
BKR NITRITE, UA: NEGATIVE
BKR NITRITE, UA: NEGATIVE
BKR PH, UA: 6 (ref 5.5–7.5)
BKR PH, UA: 6 (ref 5.5–7.5)
BKR SPECIFIC GRAVITY, UA: 1.022 (ref 1.005–1.030)
BKR SPECIFIC GRAVITY, UA: 1.015 (ref 1.005–1.030)
BKR UROBILINOGEN, UA (MG/DL): <2 mg/dL (ref <=2.0)
BKR UROBILINOGEN, UA (MG/DL): <2 mg/dL (ref <=2.0)

## 2022-09-27 LAB — COMPREHENSIVE METABOLIC PANEL
BKR A/G RATIO: 1 (ref 1.0–2.2)
BKR ALANINE AMINOTRANSFERASE (ALT): 17 U/L (ref 9–59)
BKR ALBUMIN: 3.1 g/dL — ABNORMAL LOW (ref 3.6–5.1)
BKR ALKALINE PHOSPHATASE: 86 U/L (ref 9–122)
BKR ANION GAP: 11 (ref 7–17)
BKR ASPARTATE AMINOTRANSFERASE (AST): 21 U/L (ref 10–35)
BKR AST/ALT RATIO: 1.2
BKR BILIRUBIN TOTAL: 0.6 mg/dL (ref <=1.2)
BKR BLOOD UREA NITROGEN: 8 mg/dL (ref 8–23)
BKR BUN / CREAT RATIO: 9 (ref 8.0–23.0)
BKR CALCIUM: 8.4 mg/dL — ABNORMAL LOW (ref 8.8–10.2)
BKR CHLORIDE: 105 mmol/L (ref 98–107)
BKR CO2: 21 mmol/L (ref 20–30)
BKR CREATININE: 0.89 mg/dL (ref 0.40–1.30)
BKR EGFR, CREATININE (CKD-EPI 2021): >60 mL/min/{1.73_m2} (ref >=60)
BKR GLOBULIN: 3.2 g/dL (ref 2.0–3.9)
BKR GLUCOSE: 118 mg/dL — ABNORMAL HIGH (ref 70–100)
BKR POTASSIUM: 3.7 mmol/L (ref 3.3–5.3)
BKR PROTEIN TOTAL: 6.3 g/dL (ref 5.9–8.3)
BKR SODIUM: 137 mmol/L (ref 136–144)

## 2022-09-27 LAB — CBC WITH AUTO DIFFERENTIAL
BKR WAM ABSOLUTE NRBC (2 DEC): 0 x 1000/ÂµL (ref 0.00–1.00)
BKR WAM ANC (ABSOLUTE NEUTROPHIL COUNT): 0.1 x 1000/ÂµL — ABNORMAL LOW (ref 2.00–7.60)
BKR WAM HEMATOCRIT (2 DEC): 18.3 % — ABNORMAL LOW (ref 38.50–50.00)
BKR WAM HEMOGLOBIN: 6.4 g/dL — CL (ref 13.2–17.1)
BKR WAM MCH (PG): 29.4 pg (ref 27.0–33.0)
BKR WAM MCHC: 35 g/dL (ref 31.0–36.0)
BKR WAM MCV: 83.9 fL (ref 80.0–100.0)
BKR WAM NUCLEATED RED BLOOD CELLS: 0 % (ref 0.0–1.0)
BKR WAM PLATELETS: 7 x1000/ÂµL — CL (ref 150–420)
BKR WAM RDW-CV: 14.5 % (ref 11.0–15.0)
BKR WAM RED BLOOD CELL COUNT.: 2.18 M/ÂµL — ABNORMAL LOW (ref 4.00–6.00)
BKR WAM WHITE BLOOD CELL COUNT: 0.2 x1000/ÂµL — ABNORMAL LOW (ref 4.0–11.0)

## 2022-09-27 LAB — URINE MICROSCOPIC     (BH GH LMW YH)
BKR RBC/HPF INSTRUMENT: 1 /HPF (ref 0–2)
BKR RBC/HPF INSTRUMENT: 2 /HPF (ref 0–2)
BKR URINE SQUAMOUS EPITHELIAL CELLS, UA (NUMERIC): <1 /HPF (ref 0–5)
BKR WBC/HPF INSTRUMENT: 2 /HPF (ref 0–5)
BKR WBC/HPF INSTRUMENT: 2 /HPF (ref 0–5)

## 2022-09-27 LAB — CBC WITHOUT DIFFERENTIAL
BKR WAM ANC (ABSOLUTE NEUTROPHIL COUNT): 0.06 x 1000/ÂµL — ABNORMAL LOW (ref 2.00–7.60)
BKR WAM HEMATOCRIT (2 DEC): 20.3 % — ABNORMAL LOW (ref 38.50–50.00)
BKR WAM HEMOGLOBIN: 6.9 g/dL — ABNORMAL LOW (ref 13.2–17.1)
BKR WAM MCH (PG): 28.3 pg (ref 27.0–33.0)
BKR WAM MCHC: 34 g/dL (ref 31.0–36.0)
BKR WAM MCV: 83.2 fL (ref 80.0–100.0)
BKR WAM PLATELETS: 11 x1000/ÂµL — ABNORMAL LOW (ref 150–420)
BKR WAM RDW-CV: 14.6 % (ref 11.0–15.0)
BKR WAM RED BLOOD CELL COUNT.: 2.44 M/ÂµL — ABNORMAL LOW (ref 4.00–6.00)
BKR WAM WHITE BLOOD CELL COUNT: 0.2 x1000/ÂµL — ABNORMAL LOW (ref 4.0–11.0)

## 2022-09-27 LAB — IMMATURE PLATELET FRACTION (BH GH LMW YH)
BKR WAM IPF, ABSOLUTE: 0.1 x1000/ÂµL (ref <20.0)
BKR WAM IPF, ABSOLUTE: 0.1 x1000/ÂµL (ref <20.0)
BKR WAM IPF: 0.8 % — ABNORMAL LOW (ref 1.2–8.6)
BKR WAM IPF: 1 % — ABNORMAL LOW (ref 1.2–8.6)

## 2022-09-27 LAB — PROCALCITONIN     (BH GH LMW Q YH): BKR PROCALCITONIN: 0.14 ng/mL

## 2022-09-27 LAB — MRSA SCREEN BY PCR: BKR MRSA COLONIZATION STATUS PCR: NOT DETECTED

## 2022-09-27 LAB — MAGNESIUM: BKR MAGNESIUM: 2.1 mg/dL (ref 1.7–2.4)

## 2022-09-27 MED ORDER — VANCOMYCIN MAR LEVEL
Freq: Once | INTRAVENOUS | Status: DC
Start: 2022-09-27 — End: 2022-09-27

## 2022-09-27 MED ORDER — ACETAMINOPHEN 325 MG TABLET
325 mg | Freq: Once | ORAL | Status: CP
Start: 2022-09-27 — End: ?
  Administered 2022-09-28: 02:00:00 325 mg via ORAL

## 2022-09-27 MED ORDER — ACETAMINOPHEN 325 MG TABLET
325 | Freq: Four times a day (QID) | ORAL | Status: DC | PRN
Start: 2022-09-27 — End: 2022-09-28
  Administered 2022-09-27 (×2): 325 mg via ORAL

## 2022-09-27 MED ORDER — DIPHENHYDRAMINE 50 MG/ML INJECTION (WRAPPED E-RX)
50 mg/mL | Freq: Once | INTRAVENOUS | Status: CP
Start: 2022-09-27 — End: ?
  Administered 2022-09-28: 02:00:00 50 mL via INTRAVENOUS

## 2022-09-27 NOTE — Progress Notes
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 afebrile this morning, no shortness of breath, noted some skin sensitivity in between of  his eye brow, rash on his back and font chest with no changes, no pain or itchy. No shortness of breath or chest pain. Decreased appetite, he is drinking fluids. no N/V this morning, . No abdominal pain. No bleedingReview of Systems Constitutional:  Positive for fever and malaise/fatigue. Negative for chills. HENT:  Negative for sore throat.  Respiratory:  Negative for cough, sputum production and shortness of breath.  Cardiovascular: Negative.  Negative for chest pain, palpitations and leg swelling. Gastrointestinal:  Positive for diarrhea and vomiting. Negative for abdominal pain and nausea. Genitourinary:  Negative for dysuria and urgency. Musculoskeletal: Negative.  Skin:  Positive for rash. Negative for itching. Neurological:  Negative for dizziness and headaches. Psychiatric/Behavioral: Negative.   Review of Allergies/Meds/Hx: Allergies Allergen Reactions  Voriconazole Other (See Comments)   Gingival edema/redness making it difficult to bite down  Medications:Current Facility-Administered Medications:   acetaminophen (TYLENOL) tablet 650 mg, 650 mg, Oral, Q6H PRN, William Hamburger, MD, 650 mg at 09/27/22 0048  acyclovir (ZOVIRAX) capsule 400 mg, 400 mg, Oral, Q8H, Chrystine Oiler, MD, 400 mg at 09/27/22 0523  albuterol neb sol 2.5 mg/3 mL (0.083%) (PROVENTIL,VENTOLIN), 2.5 mg, Nebulization, Q30 MIN PRN, Chrystine Oiler, MD chlorhexidine gluconate (PERIDEX) 0.12 % solution 15 mL, 15 mL, Mouth/Throat, BID, Carolyne Littles, Amer, MD, 15 mL at 09/26/22 2115  [Held by provider] ciprofloxacin HCl (CIPRO) tablet 500 mg, 500 mg, Oral, Q12H, Chrystine Oiler, MD, 500 mg at 09/26/22 2115  cycloPHOSphamide (CYTOXAN) 2,640 mg in sodium chloride 0.9% 500 mL infusion, 25 mg/kg (Treatment Plan Recorded), Intravenous, Q24H, Chrystine Oiler, MD  D5 1/2 NS 1,000 mL with potassium chloride 20 mEq, magnesium sulfate 1 g infusion, 150 mL/hr, Intravenous, Continuous, Chrystine Oiler, MD, Last Rate: 150 mL/hr at 09/26/22 2227, 150 mL/hr at 09/26/22 2227  diphenhydrAMINE (BENADRYL) injection 25 mg, 25 mg, IV Push, Q24H, Chrystine Oiler, MD  diphenhydrAMINE (BENADRYL) injection 50 mg, 50 mg, IV Push, Once PRN, Chrystine Oiler, MD  doxepin (SINEquan) capsule 25 mg, 25 mg, Oral, Q24H, Chrystine Oiler, MD  EPINEPHrine (EPI-PEN) auto-injector 0.3 mg, 0.3 mg, Intramuscular, Q15 MIN PRN, Chrystine Oiler, MD  EPINEPHrine (EPI-PEN) auto-injector 0.3 mg, 0.3 mg, Intramuscular, Q15 MIN PRN, Chrystine Oiler, MD  famotidine (PF) (PEPCID) 20 mg in sodium chloride 0.9% PF 5 mL Injection, 20 mg, IV Push, Once PRN, Chrystine Oiler, MD  Melene Muller ON 09/29/2022] filgrastim-sndz (ZARXIO) syringe 480 mcg, 5 mcg/kg (Treatment Plan Recorded), Subcutaneous, Daily (1700), Chrystine Oiler, MD  finasteride (PROSCAR) tablet 5 mg, 5 mg, Oral, Daily, Al-Musa, Amer, MD, 5 mg at 09/26/22 1028  hydrocortisone sodium succinate (PF) (Solu-CORTEF) injection 100 mg, 100 mg, IV Push, Once PRN, Chrystine Oiler, MD  isavuconazonium (Cresemba) capsule Cap 372 mg, 372 mg, Oral, Daily, Chrystine Oiler, MD, 372 mg at 09/26/22 1028  latanoprost (XALATAN) 0.005 % ophthalmic solution 1 drop, 1 drop, Both Eyes, Nightly, Al-Musa, Amer, MD, 1 drop at 09/26/22 2229  loperamide (IMODIUM) capsule 2 mg, 2 mg, Oral, 4x DAILY PRN, Roetta Sessions, MD, 2 mg at 09/26/22 1501  LORazepam (ATIVAN) tablet 0.5 mg, 0.5  mg, Oral, Q6H PRN, Chrystine Oiler, MD  mesna (MESNEX) 2,650 mg in sodium chloride 0.9% 500 mL infusion, 25 mg/kg (Treatment Plan Recorded), Intravenous, Q24H, Chrystine Oiler, MD  normal saline 0.9 % oral rinse 30 mL, 30 mL, Oral, 4x DAILY PRN, William Hamburger, MD  ondansetron (ZOFRAN-ODT) disintegrating tablet 16 mg, 16 mg, Translingual, Q12H, Chrystine Oiler, MD, 16 mg at 09/27/22 0523  piperacillin-tazobactam (ZOSYN) 4.5 g in sodium chloride 0.9% 100 mL (mini-bag plus), 4.5 g, Intravenous, Q6H, William Hamburger, MD, Last Rate: 33.3 mL/hr at 09/27/22 0526, 4.5 g at 09/27/22 0526  potassium chloride 20 mEq, sodium bicarbonate 50 mEq in D5 1/2 NS 1,000 mL infusion, 150 mL/hr, Intravenous, Continuous, Chrystine Oiler, MD, Last Rate: 150 mL/hr at 09/27/22 0520, 150 mL/hr at 09/27/22 0520  potassium chloride in sterile water 20 mEq/100 mL IVPB 20 mEq, 20 mEq, Intravenous, Q1H PRN, Magdalene River, MD, Stopped at 09/26/22 1620  potassium chloride in sterile water 20 mEq/50 mL IVPB 20 mEq, 20 mEq, Intravenous, Q1H PRN, Magdalene River, MD  prochlorperazine edisylate (COMPAZINE) injection 10 mg, 10 mg, IV Push, Q6H PRN, Chrystine Oiler, MD, 10 mg at 09/25/22 1056  sodium chloride 0.9 % (new bag) bolus 500 mL, 500 mL, Intravenous, Once PRN, Chrystine Oiler, MD  sodium chloride 0.9 % flush 10 mL, 10 mL, IV Push, PRN for Line Care, Chrystine Oiler, MD  sodium chloride 0.9 % flush 3 mL, 3 mL, IV Push, Q8H, Al-Musa, Amer, MD, 3 mL at 09/26/22 1501  sodium chloride 0.9 % flush 3 mL, 3 mL, IV Push, PRN for Line Care, Roetta Sessions, MD  sodium chloride 0.9% infusion, 5 mL/hr, Intravenous, PRN, Chrystine Oiler, MD  Melene Muller ON 09/29/2022] tacrolimus (PROGRAF) immediate release capsule 3 mg, 3 mg, Oral, Q12H (0600-1800), Chrystine Oiler, MD  ursodioL (URSO 250) tablet 500 mg, 500 mg, Oral, BID, Chrystine Oiler, MD, 500 mg at 09/26/22 2115  vancomycin (VANCOCIN) 1.5 g in sodium chloride 0.9% 500 mL IVPB (vialmate), 1.5 g, Intravenous, Q12H, William Hamburger, MD, Last Rate: 333.3 mL/hr at 09/27/22 0052, 1.5 g at 09/27/22 0052Objective: Temp:  [97.8 ?F (36.6 ?C)-100.5 ?F (38.1 ?C)] 98.9 ?F (37.2 ?C)Pulse:  [67-88] 75Resp:  [18-20] 18BP: (122-138)/(67-77) 125/70SpO2:  [94 %-98 %] 95 %I/O's:Gross Totals (Last 24 hours) at 09/27/2022 0727Last data filed at 09/27/2022 0456Intake 1860.77 ml Output 425 ml Net 1435.77 ml Wt: 09/27/22 103.7 kg 09/16/22 104.2 kg 09/13/22 104.3 kg 09/11/22 105.9 kg 09/09/22 105.4 kg 09/04/22 106.4 kg Physical ExamHENT:    Mouth/Throat:    Mouth: Mucous membranes are moist.    Pharynx: Oropharynx is clear. No oropharyngeal exudate. Eyes:    Conjunctiva/sclera: Conjunctivae normal. Cardiovascular:    Rate and Rhythm: Normal rate and regular rhythm.    Heart sounds: Normal heart sounds. No murmur heard.Pulmonary:    Effort: Pulmonary effort is normal.    Breath sounds: Normal breath sounds. No wheezing. Abdominal:    General: Bowel sounds are normal.    Palpations: Abdomen is soft.    Tenderness: There is no abdominal tenderness. Musculoskeletal:    Right lower leg: No edema.    Left lower leg: No edema. Skin:   General: Skin is warm.    Findings: Rash present. Neurological:    Mental Status: He is alert and oriented to person, place, and time. Mental status is at baseline. Psychiatric:       Mood and Affect: Mood normal. Labs:Recent Results (from the past 24 hour(s)) Blood culture  Collection Time: 09/27/22 12:08 AM  Specimen:  Peripheral; Blood Result Value Ref Range  Blood Culture No Growth to Date  Procalcitonin     (BH GH LMW Q YH)  Collection Time: 09/27/22 12:09 AM Result Value Ref Range  Procalcitonin 0.14 See Comment ng/mL Urinalysis-macroscopic w/reflex microscopic  Collection Time: 09/27/22 12:18 AM Result Value Ref Range  Clarity, UA Clear Clear  Color, UA Yellow Yellow, Colorless  Specific Gravity, UA 1.022 1.005 - 1.030  pH, UA 6.0 5.5 - 7.5  Protein, UA 1+ (A) Negative, Trace  Glucose, UA Negative Negative  Ketones, UA Negative Negative  Blood, UA Negative Negative  Bilirubin, UA Negative Negative  Leukocytes, UA Negative Negative  Nitrite, UA Negative Negative  Urobilinogen, UA <2.0 <=2.0 mg/dL Urine microscopic     (BH GH LMW YH)  Collection Time: 09/27/22 12:18 AM Result Value Ref Range  RBC/HPF, UA 1 0 - 2 /HPF  WBC/HPF, UA 2 0 - 5 /HPF MRSA screen by PCR  Collection Time: 09/27/22 12:55 AM  Specimen: Nares; Swab Result Value Ref Range  MRSA Colonization Status by PCR Not Detected Not Detected EKG  Collection Time: 09/27/22  4:47 AM Result Value Ref Range  Heart Rate 73 bpm  QRS Interval 94 ms  QT Interval 425 ms  QTC Interval 468 ms  P Axis 49 deg  QRS Axis 21 deg  T Wave Axis 16 deg  P-R Interval 187 msec  SEVERITY Normal ECG severity CBC auto differential  Collection Time: 09/27/22  5:20 AM Result Value Ref Range  WBC 0.2 (L) 4.0 - 11.0 x1000/?L  RBC 2.18 (L) 4.00 - 6.00 M/?L  Hemoglobin 6.4 (LL) 13.2 - 17.1 g/dL  Hematocrit 16.10 (L) 96.04 - 50.00 %  MCV 83.9 80.0 - 100.0 fL  MCH 29.4 27.0 - 33.0 pg  MCHC 35.0 31.0 - 36.0 g/dL  RDW-CV 54.0 98.1 - 19.1 %  Platelets 7 (LL) 150 - 420 x1000/?L  MPV    Neutrophils    Lymphocytes    Monocytes    Eosinophils    Basophil    Immature Granulocytes    nRBC 0.0 0.0 - 1.0 %  Absolute Lymphocyte Count    Monocyte Absolute Count    Eosinophil Absolute Count    Basophil Absolute Count    Absolute Immature Granulocyte Count    Absolute nRBC 0.00 0.00 - 1.00 x 1000/?L  ANC (Abs Neutrophil Count) 0.10 (L) 2.00 - 7.60 x 1000/?L Comprehensive metabolic panel  Collection Time: 09/27/22  5:20 AM Result Value Ref Range  Sodium 137 136 - 144 mmol/L  Potassium 3.7 3.3 - 5.3 mmol/L  Chloride 105 98 - 107 mmol/L  CO2 21 20 - 30 mmol/L  Anion Gap 11 7 - 17  Glucose 118 (H) 70 - 100 mg/dL  BUN 8 8 - 23 mg/dL  Creatinine 4.78 2.95 - 1.30 mg/dL  Calcium 8.4 (L) 8.8 - 10.2 mg/dL  BUN/Creatinine Ratio 9.0 8.0 - 23.0  Total Protein 6.3 5.9 - 8.3 g/dL  Albumin 3.1 (L) 3.6 - 5.1 g/dL  Total Bilirubin 0.6 <=6.2 mg/dL  Alkaline Phosphatase 86 9 - 122 U/L  Alanine Aminotransferase (ALT) 17 9 - 59 U/L  Aspartate Aminotransferase (AST) 21 10 - 35 U/L  Globulin 3.2 2.0 - 3.9 g/dL  A/G Ratio 1.0 1.0 - 2.2  AST/ALT Ratio 1.2 Reference Range Not Established  eGFR (Creatinine) >60 >=60 mL/min/1.21m2 Immature Platelet Fraction (BH GH LMW YH)  Collection Time: 09/27/22  5:20 AM Result Value Ref Range  Immature Platelet Fraction 0.8 (L)  1.2 - 8.6 %  Absolute Immature Platelet Fraction 0.1 <20.0 x1000/?L Diagnostics:XR Chest PA or AP (Portable)Result Date: 09/27/2022 No acute cardiopulmonary disease. [facility] Radiology Notify System Classification: Routine. Reported and signed by: Dulce Sellar, MD  [facility]  Assessment: Douglas [name] is a 72 y.o. 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 He is s/p 2 cycles of chemotherapy with Decitabine + Venetoclax with reduction in blast count. 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  Plan:  Day +3 (09/27/22): neutropenic fever overnight, infectious work up negative thus far, will DC vanco given MRSA negative. Continues on zosyn for now. Starting [facility] today, monitoring urine output closely. Required plts and PRBCs today, will repeat CBC this afternoon. Conditioning/Transplant: CMML -Melphalan 70 mg/m2 over 30 minutes on D-6, *Melphalan dosed reduced to 70 mg/m2 due to age-Fludarabine 40 mg/m2 over 30 minutes every 24hr for 4 doses from D-5 to D -2             -Stem cell infusion day 0 (09/24/22) HEME: - Transfuse pRBCs to maintain Hb >7 g/dL, platelets >56 or if clinically indicated- Filgrastim 68mcg/kg starting on day +5 until stable engraftment of neutrophils. ID: initial neutropenic fever 8/10 overnight, Tmax 100.5, infectious work up negative thus far, vanco dc'd 8/11 with negative MRSA, will continue zosyn for now.-Prophylactic antimicrobials: Acyclovir  (home medications), Ciprofloxacin,(on hold 8/11 as we started broad spectrum antibiotics)Isavuconazole  (prior authorization pending, voriconazole allergy-gingival hyperplasia), Pentamidine 300 mg inhalation monthly- CMV -/pending, attempt to get letermovir for CMV prophylaxis. Will send script on Day +7 to determine insurance coverage before starting inpatient. Check CMV PCR weekly.- If febrile, Repeat blood cx per pathway, add vanco if septic appearing.   -  GVHD Prophylaxis:  - PT cyclophosphamide 25 mg/kg IV over 1.5 hrs every 24hr for 2 doses on D +3 & D +4 with mesna daily on Day +3 and Day +4  (PTCy dose reduced by 50% )-Mesna 50 mg/kg CIVI daily, for 2 doses, starting 15 minutes before cyclophosphamide on D +3 & D +4- Tacrolimus  3mg  PO starting day +5.  Goal of 6-11ng/ml- Monitor levels, haptoglobin, LDH, triglycerides.- Daily skin exam, liver function tests twice weekly (minimum), document stool output/day. Fluids, Electrolytes and Nutrition:- Regular diet, consider nutrition consult if needed- Daily weights- Strict Intake and output- Replete electrolytes PRNEmesis prophylaxis: Ondansetron 16 mg PO every 12 hours w/ PRN Compazine, Dexamethasone 12mg  po premed prior to melphalan High dose cyclophosphamide: doxepin premed 25 mg and diphenhydramine 25 mg 30 mins before Cytoxan; D5W 1/2NS + KCL 20 meq + Magnesium 1 gm alternating with D5W + of NaCHO3 + KCL 20 meq until 24 hours after last Cyclophosphamide dose (hold fluid during Cytoxan infusion)Diarrhea: imodium 2mg  PRN 4 times daily VOD: - Continue Ursodiol 500mg  PO BID, continue until Day +90- Daily weights, strict fluid monitoring of all intake/output- Monitor liver function tests and coags at least twice weekly HTNPatient reports not taking his antiHTN meds for the last few days due to feeling weak. - Will hold home antihypertensive meds for now - monitor BP and reorder as appropriate  CVAD: Hickman for chemotherapy and supportive care RashMaculopapular on back and abdomen noted 8/11. Unclear etiology so far.Will CTMDischarge Planning: Pending count recovery Signed:Anberlyn Feimster Flonnie Hailstone [phone]

## 2022-09-27 NOTE — Plan of Care
Plan of Care Overview/ Patient Status   Received pt this morning in bed sleeping. He is oriented x4 when awake and reports feeling very tired today. He is day +3 of MUD for AML and awaiting engraftment. He is afebrile but is neutropenic and continues on iv and po antimicrobials per orders. 1/2 of mesna 2650mg  in 500cc of NS infusing at 20.8cc/hr via white lumen of [facility] with +blood return noted prior to infusion. Pt pre medicated with 25mg  of iv benadryl and doxipen 25mg  and given 1/2 of cytoxan 2640mg  in 500cc of NS over 1.5 hours via white lumen of [facility] with +blood return noted before and after infusion which he tolerated well with no reactions. U/a prior to cytoxan was trace heme and still is trace heme via dipstick. Urine output 600cc as of 1330 and covering notified covering provider M.Shen but no increase in type1 and type2 fluids due to large amounts of fluids given with vanco/plts and cytoxan. He was transfused with one bag of platelets this morning for count of 7k with no pre meds and he tolerated it well with no reactions and is currently being transfused with 1unit of blood for count of 6.4/18. His po intake remains poor and encouraged protein shakes but said he does not have any appetite and reports no nausea. Pt placed on bed alarm after being medicated with pre cytoxan infusion and also for increased fatigue. His wife is at bedside and will continue to monitor closely. See flowsheet for detail assessment.

## 2022-09-27 NOTE — Plan of Care
Plan of Care Overview/ Patient Status    11p-7aReceived patient awake and resting in his bed. He's a/ox4, ambulatory and independent.VS checked and recorded. No n/v noted. Temp 100.5 . MD notified. Peripheral blood culture sent as ordered. Zosyn and vanco started as ordered. UA, urine culture, MRSA swab, CXR done. IVF Type 1 at 150 cc/hr infusing well through his hickman with + BR. He complained of headache. Tylenol 650mg  po given.Slept on and off. Morning labs done. EKG and UA sent this morning. Will continue  to monitor.

## 2022-09-27 NOTE — Progress Notes
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 Appetite better. Eating food his wife is bringingSubjective: Feels nauseous this AM, but no vomiting. Has not taken compazine yet. Had a loose BM this AM, c diff -ve. Review of Systems Constitutional: Negative.  Negative for chills, fever and malaise/fatigue. HENT: Negative.  Negative for sore throat.  Eyes: Negative.  Respiratory: Negative.  Negative for cough, sputum production and shortness of breath.  Cardiovascular: Negative.  Negative for chest pain, palpitations and leg swelling. Gastrointestinal: Negative.  Negative for abdominal pain, constipation, diarrhea, nausea and vomiting. Genitourinary:  Negative for dysuria, frequency and urgency. Musculoskeletal: Negative.  Skin: Negative.  Negative for itching and rash.      Toes discolored - not new Neurological: Negative.  Psychiatric/Behavioral: Negative.   Review of Allergies/Meds/Hx: Allergies Allergen Reactions  Voriconazole Other (See Comments)   Gingival edema/redness making it difficult to bite down  Medications:Current Facility-Administered Medications:   acyclovir (ZOVIRAX) capsule 400 mg, 400 mg, Oral, Q8H, Chrystine Oiler, MD, 400 mg at 09/26/22 0539  albuterol neb sol 2.5 mg/3 mL (0.083%) (PROVENTIL,VENTOLIN), 2.5 mg, Nebulization, Q30 MIN PRN, Chrystine Oiler, MD  chlorhexidine gluconate (PERIDEX) 0.12 % solution 15 mL, 15 mL, Mouth/Throat, BID, Carolyne Littles, Amer, MD, 15 mL at 09/25/22 2006  ciprofloxacin HCl (CIPRO) tablet 500 mg, 500 mg, Oral, Q12H, Chrystine Oiler, MD, 500 mg at 09/25/22 2005  [START ON 09/27/2022] cycloPHOSphamide (CYTOXAN) 2,640 mg in sodium chloride 0.9% 500 mL infusion, 25 mg/kg (Treatment Plan Recorded), Intravenous, Q24H, Chrystine Oiler, MD  D5 1/2 NS 1,000 mL with potassium chloride 20 mEq, magnesium sulfate 1 g infusion, 150 mL/hr, Intravenous, Continuous, Chrystine Oiler, MD  Melene Muller ON 09/27/2022] diphenhydrAMINE (BENADRYL) injection 25 mg, 25 mg, IV Push, Q24H, Chrystine Oiler, MD  diphenhydrAMINE (BENADRYL) injection 50 mg, 50 mg, IV Push, Once PRN, Chrystine Oiler, MD  Melene Muller ON 09/27/2022] doxepin (SINEquan) capsule 25 mg, 25 mg, Oral, Q24H, Chrystine Oiler, MD  EPINEPHrine (EPI-PEN) auto-injector 0.3 mg, 0.3 mg, Intramuscular, Q15 MIN PRN, Chrystine Oiler, MD  EPINEPHrine (EPI-PEN) auto-injector 0.3 mg, 0.3 mg, Intramuscular, Q15 MIN PRN, Chrystine Oiler, MD  famotidine (PF) (PEPCID) 20 mg in sodium chloride 0.9% PF 5 mL Injection, 20 mg, IV Push, Once PRN, Chrystine Oiler, MD  Melene Muller ON 09/29/2022] filgrastim-sndz (ZARXIO) syringe 480 mcg, 5 mcg/kg (Treatment Plan Recorded), Subcutaneous, Daily (1700), Chrystine Oiler, MD  finasteride (PROSCAR) tablet 5 mg, 5 mg, Oral, Daily, Al-Musa, Amer, MD, 5 mg at 09/25/22 0901  hydrocortisone sodium succinate (PF) (Solu-CORTEF) injection 100 mg, 100 mg, IV Push, Once PRN, Chrystine Oiler, MD  isavuconazonium (Cresemba) capsule Cap 372 mg, 372 mg, Oral, Daily, Chrystine Oiler, MD, 372 mg at 09/25/22 0901  latanoprost (XALATAN) 0.005 % ophthalmic solution 1 drop, 1 drop, Both Eyes, Nightly, Al-Musa, Amer, MD, 1 drop at 09/24/22 2156  loperamide (IMODIUM) capsule 2 mg, 2 mg, Oral, 4x DAILY PRN, Roetta Sessions, MD  LORazepam (ATIVAN) tablet 0.5 mg, 0.5 mg, Oral, Q6H PRN, Chrystine Oiler, MD  [START ON 09/27/2022] mesna (MESNEX) 2,650 mg in sodium chloride 0.9%  500 mL infusion, 25 mg/kg (Treatment Plan Recorded), Intravenous, Q24H, Chrystine Oiler, MD  ondansetron (ZOFRAN-ODT) disintegrating tablet 16 mg, 16 mg, Translingual, Q12H, Chrystine Oiler, MD, 16 mg at 09/26/22 0539  [START ON 09/27/2022] potassium chloride 20 mEq, sodium bicarbonate 50 mEq in D5 1/2 NS 1,000 mL infusion, 150 mL/hr, Intravenous, Continuous, Chrystine Oiler, MD  prochlorperazine edisylate (COMPAZINE) injection 10 mg, 10 mg, IV Push, Q6H PRN, Chrystine Oiler, MD, 10 mg at 09/25/22 1056  sodium chloride 0.9 % (new bag) bolus 500 mL, 500 mL, Intravenous, Once PRN, Chrystine Oiler, MD  sodium chloride 0.9 % flush 10 mL, 10 mL, IV Push, PRN for Line Care, Chrystine Oiler, MD  sodium chloride 0.9 % flush 3 mL, 3 mL, IV Push, Q8H, Al-Musa, Amer, MD, 3 mL at 09/25/22 1520  sodium chloride 0.9 % flush 3 mL, 3 mL, IV Push, PRN for Line Care, Roetta Sessions, MD  sodium chloride 0.9% infusion, 5 mL/hr, Intravenous, PRN, Chrystine Oiler, MD  Melene Muller ON 09/29/2022] tacrolimus (PROGRAF) immediate release capsule 3 mg, 3 mg, Oral, Q12H (0600-1800), Chrystine Oiler, MD  ursodioL (URSO 250) tablet 500 mg, 500 mg, Oral, BID, Chrystine Oiler, MD, 500 mg at 09/25/22 2005Objective: Temp:  [97.9 ?F (36.6 ?C)-99.2 ?F (37.3 ?C)] 99 ?F (37.2 ?C)Pulse:  [76-94] 80Resp:  [18-20] 19BP: (123-136)/(68-76) 123/69SpO2:  [96 %-100 %] 96 %I/O's:Gross Totals (Last 24 hours) at 09/26/2022 0641Last data filed at 09/25/2022 1215Intake 960 ml Output 175 ml Net 785 ml Wt: 09/26/22 102.8 kg 09/16/22 104.2 kg 09/13/22 104.3 kg 09/11/22 105.9 kg 09/09/22 105.4 kg 09/04/22 106.4 kg Physical ExamConstitutional:     Appearance: Normal appearance. He is normal weight. He is not ill-appearing or diaphoretic. HENT:    Head: Normocephalic.    Nose: Nose normal.    Mouth/Throat:    Mouth: Mucous membranes are moist.    Pharynx: Oropharynx is clear. No oropharyngeal exudate. Eyes:    General: No scleral icterus.      Right eye: No discharge.       Left eye: No discharge. Cardiovascular:    Rate and Rhythm: Normal rate and regular rhythm.    Heart sounds: Normal heart sounds. No murmur heard.Pulmonary:    Effort: Pulmonary effort is normal. No respiratory distress.    Breath sounds: Normal breath sounds. No wheezing or rales. Abdominal:    General: Abdomen is flat. Bowel sounds are normal. There is no distension.    Palpations: Abdomen is soft.    Tenderness: There is no abdominal tenderness. There is no guarding. Musculoskeletal:       General: No swelling or deformity. Normal range of motion.    Right lower leg: No edema.    Left lower leg: No edema. Skin:   General: Skin is warm.    Findings: No rash.    Comments: Toes hyperpigmetned/?PVD - not newBruises, petechiae scattered Neurological:    Mental Status: He is alert and oriented to person, place, and time. Psychiatric:       Mood and Affect: Mood normal. Labs:Recent Results (from the past 24 hour(s)) C. difficile Assay  Collection Time: 09/25/22  7:57 AM  Specimen: Stool Result Value Ref Range  C. difficile Interpretation      Toxigenic C. difficile DNA was NOT detected. See narrative below.  C. difficile Toxin B DNA PCR Negative Negative Lactate dehydrogenase  Collection Time: 09/25/22  4:17 PM Result Value Ref Range  LD 394 (H) 122 - 241 U/L Haptoglobin  Collection Time: 09/25/22  4:17 PM Result  Value Ref Range  Haptoglobin 412 (H) 30 - 200 mg/dL PT/INR and PTT (BH GH L LMW YH)  Collection Time: 09/25/22  4:17 PM Result Value Ref Range  Prothrombin Time 12.2 9.6 - 12.3 seconds  INR 1.11 0.86 - 1.12  PTT 30.0 23.0 - 31.4 seconds Diagnostics:No results found.Assessment: Douglas [name] is a 72 y.o. 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 He is s/p 2 cycles of chemotherapy with Decitabine + Venetoclax with reduction in blast count. 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  Plan:  Day +2:  Conditioning/Transplant: CMML -Melphalan 70 mg/m2 over 30 minutes on D-6, *Melphalan dosed reduced to 70 mg/m2 due to age-Fludarabine 40 mg/m2 over 30 minutes every 24hr for 4 doses from D-5 to D -2             -PT cyclophosphamide 25 mg/kg IV over 1.5 hrs every 24hr for 2 doses on D +3 & D +4 (reduced by 50% to 25 mg/kg due 10/10 match and age)-Mesna 50 mg/kg CIVI daily, for 2 doses, starting 15 minutes before cyclophosphamide on D +3 & D +4-Stem cell infusion day 0 (09/24/22) HEME: - Transfuse pRBCs to maintain Hb >7 g/dL, platelets >82 or if clinically indicated- Filgrastim 33mcg/kg starting on day +5 until stable engraftment of neutrophils. ID: Prophylactic antimicrobials: Acyclovir 400 mg PO TID (home medications), Ciprofloxacin 500 mg po twice daily, Isavuconazole 372 mg po daily (prior authorization pending, voriconazole allergy-gingival hyperplasia), Pentamidine 300 mg inhalation monthly- CMV -/pending, attempt to get letermovir for CMV prophylaxis. Will send script on Day +7 to determine insurance coverage before starting inpatient.Check CMV PCR weekly.- If febrile, panculture, UA and CXR, start broad spectrum empiric antibiotics if neutropenic or septic appearing.   - Repeat blood cx every 24 hours with temperature > 100.4 or septic appearing GVHD Prophylaxis:  - Tacrolimus  3mg  PO starting day +5.  Goal of 6-11ng/ml- PT cyclophosphamide 25 mg/kg IV over 1.5 hrs every 24hr for 2 doses on D +3 & D +4 with mesna daily on Day +3 and Day +4  (PTCy dose reduced by 50% )- Monitor levels, haptoglobin, LDH, triglycerides.- Daily skin exam, liver function tests twice weekly (minimum), document stool output/day. Fluids, Electrolytes and Nutrition:- Regular diet, consider nutrition consult if needed- Daily weights- Strict Intake and output- Replete electrolytes PRNEmesis prophylaxis: Ondansetron 16 mg PO every 12 hours w/ PRN Compazine, Dexamethasone 12mg  po premed prior to melphalan High dose cyclophosphamide: doxepin premed 25 mg and diphenhydramine 25 mg 30 mins before Cytoxan; D5W 1/2NS + KCL 20 meq + Magnesium 1 gm alternating with D5W + of NaCHO3 + KCL 20 meq until 24 hours after last Cyclophosphamide dose (hold fluid during Cytoxan infusion)Diarrhea: imodium 2mg  PRN 4 times daily VOD: - Continue Ursodiol 500mg  PO BID, continue until Day +90- Daily weights, strict fluid monitoring of all intake/output- Monitor liver function tests and coags at least twice weekly HTNPatient reports not taking his antiHTN meds for the last few days due to feeling weak. - Will hold home antihypertensive meds for now - monitor BP and reorder as appropriate  CVAD: [facility] for chemotherapy and supportive care RashMaculopapular on back and abdomen. Unclear etiology so far.Will CTMDischarge Planning: Pending count recovery 24 hour caregiver: Signed:Ilisa Hayworth, [id number]/onc fellow

## 2022-09-27 NOTE — Plan of Care
1500-1900: A&Ox4. 1unit PBRC infusing at beginning of shift, tolerated well however, at completion of blood infusion, left arm noted to be slightly tremulous and temp 100.2po. Transfusion reaction labs sent and bag brought to blood bank. Given prn tylenol for temp with approval from APP. Repeat CBC obtained, awaiting results. Up in room with 1-2 assist to bedside commode, c/o dizziness and progressively more unsteady with ambulation throughout shift. Poor po intake, needs encouragement. Type 1 and 2 fluids infusing. Spont voiding. Scheduled zofran administered as ordered. 1 episode of emesis and stool incontinence. Wife at bedside and very supportive of pt and care. Fall risk and protective precautions maintained, bed alarm for safety. See flowsheets for more details.

## 2022-09-27 NOTE — Plan of Care
Plan of Care Overview/ Patient Status    Shift: 1500-2300C/O: AML D+2 ALLO MUD Type I and II fluids started tonight. NEURO: a+ox4RESPIRATORY: ra vss afebrileCV: No CP/SOBIV:  Patent RDLH 8/4GI/GU: No N/V/D       LBM today soft            Meds: Pills wholeSKIN: good                                   DRSG: CVAD CDIPAIN: 0/10                                 Vitals:  09/26/22 0918 09/26/22 1300 09/26/22 1739 09/26/22 1909 SpO2: 96% 98% 98% 98%  Meds:      AMB: INDSAFETY:  Two side rails up, Call light within reach.COMMENTS: c-diff negative no imodium needed today. Pt received 2 bags of potassium IV during day shift.

## 2022-09-27 NOTE — Progress Notes
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 afebrile this morning, no shortness of breath, noted some skin sensitivity in between of  his eye brow, rash on his back and font chest with no changes, no pain or itchy. No shortness of breath or chest pain. Decreased appetite, he is drinking fluids. no N/V this morning, . No abdominal pain. No bleedingReview of Systems Constitutional:  Positive for fever and malaise/fatigue. Negative for chills. HENT:  Negative for sore throat.  Respiratory:  Negative for cough, sputum production and shortness of breath.  Cardiovascular: Negative.  Negative for chest pain, palpitations and leg swelling. Gastrointestinal:  Positive for diarrhea and vomiting. Negative for abdominal pain and nausea. Genitourinary:  Negative for dysuria and urgency. Musculoskeletal: Negative.  Skin:  Positive for rash. Negative for itching. Neurological:  Negative for dizziness and headaches. Psychiatric/Behavioral: Negative.   Review of Allergies/Meds/Hx: Allergies Allergen Reactions  Voriconazole Other (See Comments)   Gingival edema/redness making it difficult to bite down  Medications:Current Facility-Administered Medications:   acetaminophen (TYLENOL) tablet 650 mg, 650 mg, Oral, Q6H PRN, William Hamburger, MD, 650 mg at 09/27/22 0048  acyclovir (ZOVIRAX) capsule 400 mg, 400 mg, Oral, Q8H, Chrystine Oiler, MD, 400 mg at 09/27/22 1309  albuterol neb sol 2.5 mg/3 mL (0.083%) (PROVENTIL,VENTOLIN), 2.5 mg, Nebulization, Q30 MIN PRN, Chrystine Oiler, MD chlorhexidine gluconate (PERIDEX) 0.12 % solution 15 mL, 15 mL, Mouth/Throat, BID, Al-Musa, Amer, MD, 15 mL at 09/27/22 1135  [Held by provider] ciprofloxacin HCl (CIPRO) tablet 500 mg, 500 mg, Oral, Q12H, Chrystine Oiler, MD, 500 mg at 09/26/22 2115  cycloPHOSphamide (CYTOXAN) 2,640 mg in sodium chloride 0.9% 500 mL infusion, 25 mg/kg (Treatment Plan Recorded), Intravenous, Q24H, Chrystine Oiler, MD, Stopped at 09/27/22 1445  D5 1/2 NS 1,000 mL with potassium chloride 20 mEq, magnesium sulfate 1 g infusion, 150 mL/hr, Intravenous, Continuous, Chrystine Oiler, MD, Last Rate: 150 mL/hr at 09/27/22 1445, 150 mL/hr at 09/27/22 1445  diphenhydrAMINE (BENADRYL) injection 25 mg, 25 mg, IV Push, Q24H, Chrystine Oiler, MD, 25 mg at 09/27/22 1134  diphenhydrAMINE (BENADRYL) injection 50 mg, 50 mg, IV Push, Once PRN, Chrystine Oiler, MD  doxepin (SINEquan) capsule 25 mg, 25 mg, Oral, Q24H, Chrystine Oiler, MD, 25 mg at 09/27/22 1134  EPINEPHrine (EPI-PEN) auto-injector 0.3 mg, 0.3 mg, Intramuscular, Q15 MIN PRN, Chrystine Oiler, MD  EPINEPHrine (EPI-PEN) auto-injector 0.3 mg, 0.3 mg, Intramuscular, Q15 MIN PRN, Chrystine Oiler, MD  famotidine (PF) (PEPCID) 20 mg in sodium chloride 0.9% PF 5 mL Injection, 20 mg, IV Push, Once PRN, Chrystine Oiler, MD  Melene Muller ON 09/29/2022] filgrastim-sndz (ZARXIO) syringe 480 mcg, 5 mcg/kg (Treatment Plan Recorded), Subcutaneous, Daily (1700), Chrystine Oiler, MD  finasteride (PROSCAR) tablet 5 mg, 5 mg, Oral, Daily, Al-Musa, Amer, MD, 5 mg at 09/27/22 0848  hydrocortisone sodium succinate (PF) (Solu-CORTEF) injection 100 mg, 100 mg, IV Push, Once PRN, Chrystine Oiler, MD  isavuconazonium (Cresemba) capsule Cap 372 mg, 372 mg, Oral, Daily, Chrystine Oiler, MD, 372 mg at 09/27/22 0848  latanoprost (XALATAN) 0.005 % ophthalmic solution 1 drop, 1 drop, Both Eyes, Nightly, Al-Musa, Amer, MD, 1 drop at 09/26/22 2229  loperamide (IMODIUM) capsule 2 mg, 2 mg, Oral, 4x DAILY PRN, Al-Musa,  Denyse Dago, MD, 2 mg at 09/27/22 1309  LORazepam (ATIVAN) tablet 0.5 mg, 0.5 mg, Oral, Q6H PRN, Chrystine Oiler, MD  mesna (MESNEX) 2,650 mg in sodium chloride 0.9% 500 mL infusion, 25 mg/kg (Treatment Plan Recorded), Intravenous, Q24H, Chrystine Oiler, MD, Last Rate: 20.8 mL/hr at 09/27/22 1116, 2,650 mg at 09/27/22 1116  normal saline 0.9 % oral rinse 30 mL, 30 mL, Oral, 4x DAILY PRN, William Hamburger, MD  ondansetron (ZOFRAN-ODT) disintegrating tablet 16 mg, 16 mg, Translingual, Q12H, Chrystine Oiler, MD, 16 mg at 09/27/22 1746  piperacillin-tazobactam (ZOSYN) 4.5 g in sodium chloride 0.9% 100 mL (mini-bag plus), 4.5 g, Intravenous, Q6H, William Hamburger, MD, Last Rate: 33.3 mL/hr at 09/27/22 1449, 4.5 g at 09/27/22 1449  potassium chloride 20 mEq, sodium bicarbonate 50 mEq in D5 1/2 NS 1,000 mL infusion, 150 mL/hr, Intravenous, Continuous, Chrystine Oiler, MD, Last Rate: 150 mL/hr at 09/27/22 0520, 150 mL/hr at 09/27/22 0520  potassium chloride in sterile water 20 mEq/100 mL IVPB 20 mEq, 20 mEq, Intravenous, Q1H PRN, Magdalene River, MD, Stopped at 09/26/22 1620  potassium chloride in sterile water 20 mEq/50 mL IVPB 20 mEq, 20 mEq, Intravenous, Q1H PRN, Magdalene River, MD  prochlorperazine edisylate (COMPAZINE) injection 10 mg, 10 mg, IV Push, Q6H PRN, Chrystine Oiler, MD, 10 mg at 09/25/22 1056  sodium chloride 0.9 % (new bag) bolus 500 mL, 500 mL, Intravenous, Once PRN, Chrystine Oiler, MD  sodium chloride 0.9 % flush 10 mL, 10 mL, IV Push, PRN for Line Care, Chrystine Oiler, MD, 10 mL at 09/27/22 1239  sodium chloride 0.9 % flush 3 mL, 3 mL, IV Push, Q8H, Al-Musa, Amer, MD, 3 mL at 09/26/22 1501  sodium chloride 0.9 % flush 3 mL, 3 mL, IV Push, PRN for Line Care, Roetta Sessions, MD  sodium chloride 0.9% infusion, 5 mL/hr, Intravenous, PRN, Chrystine Oiler, MD  Melene Muller ON 09/29/2022] tacrolimus (PROGRAF) immediate release capsule 3 mg, 3 mg, Oral, Q12H (0600-1800), Chrystine Oiler, MD  ursodioL (URSO 250) tablet 500 mg, 500 mg, Oral, BID, Chrystine Oiler, MD, 500 mg at 09/27/22 0848Objective: Temp:  [97.7 ?F (36.5 ?C)-100.5 ?F (38.1 ?C)] 100.2 ?F (37.9 ?C)Pulse:  [67-95] 88Resp:  [14-18] 16BP: (122-148)/(63-73) 125/65SpO2:  [94 %-100 %] 97 %I/O's:Gross Totals (Last 24 hours) at 09/27/2022 1827Last data filed at 09/27/2022 1758Intake 3657.86 ml Output 1425 ml Net 2232.86 ml Wt: 09/27/22 103.7 kg 09/16/22 104.2 kg 09/13/22 104.3 kg 09/11/22 105.9 kg 09/09/22 105.4 kg 09/04/22 106.4 kg Physical ExamHENT:    Mouth/Throat:    Mouth: Mucous membranes are moist.    Pharynx: Oropharynx is clear. No oropharyngeal exudate. Eyes:    Conjunctiva/sclera: Conjunctivae normal. Cardiovascular:    Rate and Rhythm: Normal rate and regular rhythm.    Heart sounds: Normal heart sounds. No murmur heard.Pulmonary:    Effort: Pulmonary effort is normal.    Breath sounds: Normal breath sounds. No wheezing. Abdominal:    General: Bowel sounds are normal.    Palpations: Abdomen is soft.    Tenderness: There is no abdominal tenderness. Musculoskeletal:    Right lower leg: No edema.    Left lower leg: No edema. Skin:   General: Skin is warm.    Findings: Rash present. Neurological:    Mental Status: He is alert and oriented to person, place, and time. Mental status is at baseline. Psychiatric:       Mood and Affect: Mood normal. Labs:Recent Results (from the past 24 hour(s)) Blood culture  Collection Time: 09/27/22 12:08 AM  Specimen: Peripheral;  Blood Result Value Ref Range  Blood Culture No Growth to Date  Procalcitonin     (BH GH LMW Q YH)  Collection Time: 09/27/22 12:09 AM Result Value Ref Range  Procalcitonin 0.14 See Comment ng/mL Urinalysis-macroscopic w/reflex microscopic  Collection Time: 09/27/22 12:18 AM Result Value Ref Range  Clarity, UA Clear Clear  Color, UA Yellow Yellow, Colorless  Specific Gravity, UA 1.022 1.005 - 1.030  pH, UA 6.0 5.5 - 7.5  Protein, UA 1+ (A) Negative, Trace  Glucose, UA Negative Negative  Ketones, UA Negative Negative  Blood, UA Negative Negative  Bilirubin, UA Negative Negative  Leukocytes, UA Negative Negative  Nitrite, UA Negative Negative  Urobilinogen, UA <2.0 <=2.0 mg/dL Urine microscopic     (BH GH LMW YH)  Collection Time: 09/27/22 12:18 AM Result Value Ref Range  RBC/HPF, UA 1 0 - 2 /HPF  WBC/HPF, UA 2 0 - 5 /HPF MRSA screen by PCR  Collection Time: 09/27/22 12:55 AM  Specimen: Nares; Swab Result Value Ref Range  MRSA Colonization Status by PCR Not Detected Not Detected EKG  Collection Time: 09/27/22  4:47 AM Result Value Ref Range  Heart Rate 73 bpm  QRS Interval 94 ms  QT Interval 425 ms  QTC Interval 468 ms  P Axis 49 deg  QRS Axis 21 deg  T Wave Axis 16 deg  P-R Interval 187 msec  SEVERITY Normal ECG severity CBC auto differential  Collection Time: 09/27/22  5:20 AM Result Value Ref Range  WBC 0.2 (L) 4.0 - 11.0 x1000/?L  RBC 2.18 (L) 4.00 - 6.00 M/?L  Hemoglobin 6.4 (LL) 13.2 - 17.1 g/dL  Hematocrit 19.14 (L) 78.29 - 50.00 %  MCV 83.9 80.0 - 100.0 fL  MCH 29.4 27.0 - 33.0 pg  MCHC 35.0 31.0 - 36.0 g/dL  RDW-CV 56.2 13.0 - 86.5 %  Platelets 7 (LL) 150 - 420 x1000/?L  MPV    Neutrophils    Lymphocytes    Monocytes    Eosinophils    Basophil    Immature Granulocytes    nRBC 0.0 0.0 - 1.0 %  Absolute Lymphocyte Count    Monocyte Absolute Count    Eosinophil Absolute Count    Basophil Absolute Count    Absolute Immature Granulocyte Count    Absolute nRBC 0.00 0.00 - 1.00 x 1000/?L  ANC (Abs Neutrophil Count) 0.10 (L) 2.00 - 7.60 x 1000/?L Comprehensive metabolic panel  Collection Time: 09/27/22  5:20 AM Result Value Ref Range  Sodium 137 136 - 144 mmol/L  Potassium 3.7 3.3 - 5.3 mmol/L  Chloride 105 98 - 107 mmol/L  CO2 21 20 - 30 mmol/L  Anion Gap 11 7 - 17  Glucose 118 (H) 70 - 100 mg/dL  BUN 8 8 - 23 mg/dL  Creatinine 7.84 6.96 - 1.30 mg/dL  Calcium 8.4 (L) 8.8 - 10.2 mg/dL  BUN/Creatinine Ratio 9.0 8.0 - 23.0  Total Protein 6.3 5.9 - 8.3 g/dL  Albumin 3.1 (L) 3.6 - 5.1 g/dL  Total Bilirubin 0.6 <=2.9 mg/dL  Alkaline Phosphatase 86 9 - 122 U/L  Alanine Aminotransferase (ALT) 17 9 - 59 U/L  Aspartate Aminotransferase (AST) 21 10 - 35 U/L  Globulin 3.2 2.0 - 3.9 g/dL  A/G Ratio 1.0 1.0 - 2.2  AST/ALT Ratio 1.2 Reference Range Not Established  eGFR (Creatinine) >60 >=60 mL/min/1.34m2 Immature Platelet Fraction (BH GH LMW YH)  Collection Time: 09/27/22  5:20 AM Result Value Ref Range  Immature Platelet Fraction 0.8 (L) 1.2 -  8.6 %  Absolute Immature Platelet Fraction 0.1 <20.0 x1000/?L Urinalysis-macroscopic w/reflex microscopic  Collection Time: 09/27/22  7:40 AM Result Value Ref Range  Clarity, UA Clear Clear  Color, UA Yellow Yellow, Colorless  Specific Gravity, UA 1.015 1.005 - 1.030  pH, UA 6.0 5.5 - 7.5  Protein, UA Trace Negative, Trace  Glucose, UA Negative Negative  Ketones, UA Negative Negative  Blood, UA Trace (A) Negative  Bilirubin, UA Negative Negative  Leukocytes, UA Negative Negative  Nitrite, UA Negative Negative  Urobilinogen, UA <2.0 <=2.0 mg/dL Urine microscopic     (BH GH LMW YH)  Collection Time: 09/27/22  7:40 AM Result Value Ref Range  RBC/HPF, UA 2 0 - 2 /HPF  WBC/HPF, UA 2 0 - 5 /HPF  Bacteria, UA Rare None-Rare /HPF  Urine Squamous Epithelial Cells, UA <1 0 - 5 /HPF Diagnostics:XR Chest PA or AP (Portable)Result Date: 09/27/2022 No acute cardiopulmonary disease. [facility] Radiology Notify System Classification: Routine. Reported and signed by: Dulce Sellar, MD  [facility] Radiology and Biomedical Imaging  Assessment: Douglas [name] is a 72 y.o. 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 He is s/p 2 cycles of chemotherapy with Decitabine + Venetoclax with reduction in blast count. 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  Plan:  Day +3 (09/27/22): neutropenic fever overnight, infectious work up negative thus far, will DC vanco given MRSA negative. Continues on zosyn for now. Starting [facility] today, monitoring urine output closely. Required plts and PRBCs today, will repeat CBC this afternoon. Conditioning/Transplant: CMML -Melphalan 70 mg/m2 over 30 minutes on D-6, *Melphalan dosed reduced to 70 mg/m2 due to age-Fludarabine 40 mg/m2 over 30 minutes every 24hr for 4 doses from D-5 to D -2             -Stem cell infusion day 0 (09/24/22) HEME: - Transfuse pRBCs to maintain Hb >7 g/dL, platelets >91 or if clinically indicated- Filgrastim 64mcg/kg starting on day +5 until stable engraftment of neutrophils. ID: initial neutropenic fever 8/10 overnight, Tmax 100.5, infectious work up negative thus far, vanco dc'd 8/11 with negative MRSA, will continue zosyn for now.-Prophylactic antimicrobials: Acyclovir  (home medications), Ciprofloxacin,(on hold 8/11 as we started broad spectrum antibiotics)Isavuconazole  (prior authorization pending, voriconazole allergy-gingival hyperplasia), Pentamidine 300 mg inhalation monthly- CMV -/pending, attempt to get letermovir for CMV prophylaxis. Will send script on Day +7 to determine insurance coverage before starting inpatient. Check CMV PCR weekly.- If febrile, Repeat blood cx per pathway, add vanco if septic appearing.   -  GVHD Prophylaxis:  - PT cyclophosphamide 25 mg/kg IV over 1.5 hrs every 24hr for 2 doses on D +3 & D +4 with mesna daily on Day +3 and Day +4  (PTCy dose reduced by 50% )-Mesna 50 mg/kg CIVI daily, for 2 doses, starting 15 minutes before cyclophosphamide on D +3 & D +4- Tacrolimus  3mg  PO starting day +5.  Goal of 6-11ng/ml- Monitor levels, haptoglobin, LDH, triglycerides.- Daily skin exam, liver function tests twice weekly (minimum), document stool output/day. Fluids, Electrolytes and Nutrition:- Regular diet, consider nutrition consult if needed- Daily weights- Strict Intake and output- Replete electrolytes PRNEmesis prophylaxis: Ondansetron 16 mg PO every 12 hours w/ PRN Compazine, Dexamethasone 12mg  po premed prior to melphalan High dose cyclophosphamide: doxepin premed 25 mg and diphenhydramine 25 mg 30 mins before Cytoxan; D5W 1/2NS + KCL 20  meq + Magnesium 1 gm alternating with D5W + of NaCHO3 + KCL 20 meq until 24 hours after last Cyclophosphamide dose (hold fluid during Cytoxan infusion)Diarrhea: imodium 2mg  PRN 4 times daily VOD: - Continue Ursodiol 500mg  PO BID, continue until Day +90- Daily weights, strict fluid monitoring of all intake/output- Monitor liver function tests and coags at least twice weekly HTNPatient reports not taking his antiHTN meds for the last few days due to feeling weak. - Will hold home antihypertensive meds for now - monitor BP and reorder as appropriate  CVAD: [facility]ickman for chemotherapy and supportive care RashMaculopapular on back and abdomen noted 8/11. Unclear etiology so far.Will CTMDischarge Planning: Pending count recovery Signed:Purcell Jungbluth Flonnie Hailstone, [phone]: temp 100.2 at the end of pRBCs transfusion, tired, no other new symptoms. Diarrhea improving,  Other vitals stable, Will send transfusion reaction sample, monitoring closely. Continue to monitor urine output closely, Wife updated at bedside this afternoon.

## 2022-09-28 LAB — CBC WITH AUTO DIFFERENTIAL
BKR WAM ABSOLUTE NRBC (2 DEC): 0 x 1000/ÂµL (ref 0.00–1.00)
BKR WAM ANC (ABSOLUTE NEUTROPHIL COUNT): 0.02 x 1000/ÂµL — ABNORMAL LOW (ref 2.00–7.60)
BKR WAM HEMATOCRIT (2 DEC): 21.5 % — ABNORMAL LOW (ref 38.50–50.00)
BKR WAM HEMOGLOBIN: 7.4 g/dL — ABNORMAL LOW (ref 13.2–17.1)
BKR WAM MCH (PG): 28.9 pg (ref 27.0–33.0)
BKR WAM MCHC: 34.4 g/dL (ref 31.0–36.0)
BKR WAM MCV: 84 fL (ref 80.0–100.0)
BKR WAM NUCLEATED RED BLOOD CELLS: 0 % (ref 0.0–1.0)
BKR WAM PLATELETS: 7 x1000/ÂµL — CL (ref 150–420)
BKR WAM RDW-CV: 15.3 % — ABNORMAL HIGH (ref 11.0–15.0)
BKR WAM RED BLOOD CELL COUNT.: 2.56 M/ÂµL — ABNORMAL LOW (ref 4.00–6.00)
BKR WAM WHITE BLOOD CELL COUNT: 0.1 x1000/ÂµL — ABNORMAL LOW (ref 4.0–11.0)

## 2022-09-28 LAB — COMPREHENSIVE METABOLIC PANEL
BKR A/G RATIO: 1 (ref 1.0–2.2)
BKR ALANINE AMINOTRANSFERASE (ALT): 19 U/L (ref 9–59)
BKR ALBUMIN: 2.9 g/dL — ABNORMAL LOW (ref 3.6–5.1)
BKR ALKALINE PHOSPHATASE: 78 U/L (ref 9–122)
BKR ANION GAP: 8 (ref 7–17)
BKR ASPARTATE AMINOTRANSFERASE (AST): 20 U/L (ref 10–35)
BKR AST/ALT RATIO: 1.1
BKR BILIRUBIN TOTAL: 0.6 mg/dL (ref <=1.2)
BKR BLOOD UREA NITROGEN: 5 mg/dL — ABNORMAL LOW (ref 8–23)
BKR BUN / CREAT RATIO: 6.2 — ABNORMAL LOW (ref 8.0–23.0)
BKR CALCIUM: 7.9 mg/dL — ABNORMAL LOW (ref 8.8–10.2)
BKR CHLORIDE: 108 mmol/L — ABNORMAL HIGH (ref 98–107)
BKR CO2: 21 mmol/L (ref 20–30)
BKR CREATININE: 0.81 mg/dL (ref 0.40–1.30)
BKR EGFR, CREATININE (CKD-EPI 2021): >60 mL/min/{1.73_m2} (ref >=60)
BKR GLOBULIN: 2.9 g/dL (ref 2.0–3.9)
BKR GLUCOSE: 95 mg/dL (ref 70–100)
BKR POTASSIUM: 3.5 mmol/L (ref 3.3–5.3)
BKR PROTEIN TOTAL: 5.8 g/dL — ABNORMAL LOW (ref 5.9–8.3)
BKR SODIUM: 137 mmol/L (ref 136–144)

## 2022-09-28 LAB — URINALYSIS-MACROSCOPIC W/REFLEX MICROSCOPIC
BKR BILIRUBIN, UA: NEGATIVE
BKR BLOOD, UA: NEGATIVE
BKR GLUCOSE, UA: NEGATIVE
BKR LEUKOCYTE ESTERASE, UA: NEGATIVE
BKR NITRITE, UA: NEGATIVE
BKR PH, UA: 6 (ref 5.5–7.5)
BKR PROTEIN, UA: NEGATIVE
BKR SPECIFIC GRAVITY, UA: 1.012 (ref 1.005–1.030)
BKR UROBILINOGEN, UA (MG/DL): <2 mg/dL (ref <=2.0)

## 2022-09-28 LAB — URINE CULTURE: BKR URINE CULTURE, ROUTINE: NO GROWTH

## 2022-09-28 LAB — IMMATURE PLATELET FRACTION (BH GH LMW YH)
BKR WAM IPF, ABSOLUTE: 0 x1000/ÂµL (ref <20.0)
BKR WAM IPF: 0.6 % — ABNORMAL LOW (ref 1.2–8.6)

## 2022-09-28 LAB — MAGNESIUM: BKR MAGNESIUM: 2.2 mg/dL (ref 1.7–2.4)

## 2022-09-28 LAB — SODIUM: BKR SODIUM: 138 mmol/L (ref 136–144)

## 2022-09-28 MED ORDER — ACETAMINOPHEN 325 MG TABLET
325 | Freq: Four times a day (QID) | ORAL | Status: AC | PRN
Start: 2022-09-28 — End: ?

## 2022-09-28 MED ORDER — FUROSEMIDE 10 MG/ML INJECTION SOLUTION
10 mg/mL | Freq: Once | INTRAVENOUS | Status: CP
Start: 2022-09-28 — End: 2022-10-04

## 2022-09-28 MED ORDER — ONDANSETRON HCL (PF) 4 MG/2 ML INJECTION SOLUTION
4 | Freq: Two times a day (BID) | INTRAVENOUS | Status: CP
Start: 2022-09-28 — End: ?
  Administered 2022-09-28 – 2022-10-07 (×18): 4 mL via INTRAVENOUS

## 2022-09-28 MED ORDER — DIPHENOXYLATE-ATROPINE 2.5 MG-0.025 MG TABLET
Freq: Four times a day (QID) | ORAL | Status: CP | PRN
Start: 2022-09-28 — End: ?
  Administered 2022-09-28 – 2022-10-06 (×6): via ORAL

## 2022-09-28 MED ORDER — FUROSEMIDE 10 MG/ML INJECTION SOLUTION
10 | Freq: Once | INTRAVENOUS | Status: CP
Start: 2022-09-28 — End: ?
  Administered 2022-09-28: 12:00:00 10 mL via INTRAVENOUS

## 2022-09-28 MED ORDER — SODIUM CHLORIDE 0.9 % BOLUS (NEW BAG)
0.9 | Freq: Once | INTRAVENOUS | Status: CP
Start: 2022-09-28 — End: ?
  Administered 2022-09-28: 06:00:00 0.9 mL/h via INTRAVENOUS

## 2022-09-28 NOTE — Plan of Care
Plan of Care Overview/ Patient Status    1900-0730:D+3 into D+4 Allo SCTThis shift, patient A&O X 4, vital signs stable on room air, assist x1 out of bed w/ a steady gait, patient feeling weaker than usual.  Bed alarm active and audible, family member at bedside.  Voids spontaneously, up to bedside commode and into urinals. LBM 09/28/22, diarrhea. No complaints of pain this shift.  Patient had one episode of spitting up phlegm this morning. Patient complained of nausea intermittently but did not need extra intervention.  Medications taken whole with juice. PRN Imodium administered per [facility] x2. PRN lomotil administered per MAR x1. R DL Hickman, dressing clean, dry, and intact, flushes well without difficulty, blood return present, currently running w/ Type I and Type II fluids, Mesna and Zosyn KVO. Patient was given 1 unit of PRBC this shift.  Patient premedicated w/ Tylenol 650 mg PO and Benadry 25 mg IV.  Patient tolerated infusion well w no signs or symptoms of reaction.  EKG completed this AM.  U/A collected this AM.  No repletion's this shift.  Patient turns and repositions independenently. Patient fluids running at 200 mL/hr.  Patient had inadequate urine output throughout shift.  PM bladder scan performed, resulted at .  AM bladder scan resulted at .  Provider Kemper Durie notified and aware.  500 cc NS bolus ordered and administered per MAR x1.  No Lasix to be administered this shift per Provider. 0750:  Lasix ordered and administered per MAR. Safety/Protective/Fall precautions maintained this shift. Call bell and personal items within reach. Safety and hourly rounds completed throughout shift. Care plan maintained throughout shift. Please see flow sheets for further details. Pearline Cables, RNProblem: Adult Inpatient Plan of CareGoal: Plan of Care ReviewOutcome: Interventions implemented as appropriateGoal: Optimal Comfort and WellbeingOutcome: Interventions implemented as appropriate Problem: Fall Injury RiskGoal: Absence of Fall and Fall-Related InjuryOutcome: Interventions implemented as appropriate

## 2022-09-28 NOTE — Other
Blood Bank - Transfusion Reaction Consult NoteConsult Information Patient: Douglas [name] ([id number]) DOB: 1950/06/07 Sex: maleConsultation Requested By: Morrie Sheldon, PAReason for Consultation: Report of Suspected Transfusion ReactionReason for Transfusion: anemia (Hb: 6.4 g/dL)Source of Information: Doctor and EMR/Previous RecordBlood Group: ADate/Time of Suspected Transfusion Reaction: 09/27/2022, 6:10pmBlood Component Suspected in Reaction: RBCsImpression Transfusion workup diagnosis: febrile non-hemolytic transfusion reaction (FNHTR) vs underlying diseaseThis patient has a rise of body temperature of 2.4 F after receiving RBC transfusion with rigors and chills.?Patient had?similar febrile symptoms last night. His hemodynamic status did not change,?ruling out septic transfusion reaction and the blood bank workup was negative,?ruling out hemolytic transfusion reaction.? CDC Hemovigilance Adverse Reaction Case ClassificationCategory: Febrile non-hemolytic transfusion reaction [facility] Definition: DefinitiveSeverity: Non-severeImputability: ProbableReference: CDC NHSN Hemovigilance Module Surveillance Protocol Recommendations This patient may benefit from premedication with Tylenol 30 min prior to transfusion and transfuse slowly over 4 hours and monitor the patient closely. Clinical History Description of transfusion reaction:Douglas [name] is a 72 y.o. 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 The patient received 1u RBC for anemia (Hb: 6.4 g/dL). When the transfusion stops, the patient was noted to have a fever of 100.107F with chills and rigors (left arm shaking). No other symptoms including shortness of breath, rash or body ache were reported. Patient was neutropenic and was fever (100.5 F on 09/26/22). The post transfusion reaction sample has been sent to blood bank.  Tylenol was given to the patient. Prior transfusion reactions: noneTransfusion pre-medication: noneTransfusion information:Product information: RBCs   Product blood group: A, Rh positive   Donor unit #: 	[id number]  24  [id number]  [id number]   Unit expiration date/time: 10/23/2022, 23:59Transfusion start date/time: 09/27/22, 15:45Transfusion end date/time: 09/27/22, 18:10Volume transfused: 290 mlPhysical Exam Vital Signs (last 24 hours):Temp:  [97.7 ?F (36.5 ?C)-100.5 ?F (38.1 ?C)] 100.2 ?F (37.9 ?C)Pulse:  [67-95] 88Resp:  [14-18] 16BP: (122-148)/(63-73) 125/65SpO2:  [94 %-100 %] 97 %Before transfusion (11:14):	T 97.8 ?F, P 95, R 18, BP 148/69, O2 sat 99%/RA15 minutes (16:05):		T 98.1 ?F, P 84, R 14, BP 135/70, O2 sat 98%/RADuring reaction (18:16):		T 100.2 ?F, P 88, R 16, BP 125/65, O2 sat 97%/RAAfter reaction (21:03):		T 98.1 ?F, P 75, R 17, BP 122/65, O2 sat 94%/RABlood Bank Workup Pre-transfusion work-up date: 09/25/2022   Blood group/Rh type: A, Rh positive   Antibody screen: negative   Antibody panel: not done   DAT (Poly): not done   DAT (IgG): not done   DAT (C3): not donePost-transfusion work-up date: 09/27/2022   Antibody screen: negative   Antibody panel: not done   DAT (Poly): negative   DAT (IgG): not done   DAT (C3): not done   Serum visible hemolysis: absent   Repeat crossmatch: negative? Cultures:    Patient was not cultured    Product was not culturedLabels, tags, container identification and Blood Bank records reviewed and are correct. Appropriate laboratory testing repeated and found to be correct.Attending attestation to follow.Signed: Marlowe Kays, MD, PhD, PGY-3Blood Bank/Transfusion Medicine ResidentDepartment of Laboratory MedicineYale-New Westwood/[facility] Heartbeat: [phone] I have reviewed the patient's record with the resident and fellow. I have reviewed the report of the transfusion reaction and reviewed the applicable clinical and serological information. I agree with the opinion of the resident/fellow. 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 Underlying disease is lower in the differential diagnosis. 	Monico Hoar, MDDirector Blood [phone]  [phone]  cell8/12/2022 6:45 PM

## 2022-09-28 NOTE — Progress Notes
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 Reports new rash in his left eye and forehead. Not itchy but painful. Continues to have diarrhea. No SOB. No bleeding or abdominal pain. Review of Systems Constitutional:  Positive for malaise/fatigue. Negative for chills and fever. HENT: Negative.  Negative for sore throat.  Eyes: Negative.  Respiratory: Negative.  Negative for cough, sputum production, shortness of breath and wheezing.  Cardiovascular: Negative.  Negative for chest pain, palpitations and leg swelling. Gastrointestinal:  Positive for diarrhea and nausea. Negative for abdominal pain, constipation and vomiting. Genitourinary:  Negative for dysuria, frequency and urgency. Musculoskeletal: Negative.  Skin:  Positive for rash. Negative for itching.      Toes discolored - not new Neurological: Negative.  Psychiatric/Behavioral: Negative.   Review of Allergies/Meds/Hx: Allergies Allergen Reactions  Voriconazole Other (See Comments)   Gingival edema/redness making it difficult to bite down  Medications:Current Facility-Administered Medications:   acetaminophen (TYLENOL) tablet 650 mg, 650 mg, Oral, Q6H PRN, Shen, Meifeng, PA  acyclovir (ZOVIRAX) capsule 400 mg, 400 mg, Oral, Q8H, Chrystine Oiler, MD, 400 mg at 09/28/22 0448  albuterol neb sol 2.5 mg/3 mL (0.083%) (PROVENTIL,VENTOLIN), 2.5 mg, Nebulization, Q30 MIN PRN, Chrystine Oiler, MD  chlorhexidine gluconate (PERIDEX) 0.12 % solution 15 mL, 15 mL, Mouth/Throat, BID, Al-Musa, Amer, MD, 15 mL at 09/28/22 0944  cycloPHOSphamide (CYTOXAN) 2,640 mg in sodium chloride 0.9% 500 mL infusion, 25 mg/kg (Treatment Plan Recorded), Intravenous, Q24H, Chrystine Oiler, MD, Stopped at 09/27/22 1445  D5 1/2 NS 1,000 mL with potassium chloride 20 mEq, magnesium sulfate 1 g infusion, 150 mL/hr, Intravenous, Continuous, Chrystine Oiler, MD, Last Rate: 200 mL/hr at 09/28/22 0234, 200 mL/hr at 09/28/22 0234  diphenhydrAMINE (BENADRYL) injection 50 mg, 50 mg, IV Push, Once PRN, Chrystine Oiler, MD  diphenoxylate-atropine (LOMOTIL) 2.5-0.025 mg per tablet 1 tablet, 1 tablet, Oral, 4x DAILY PRN, Roetta Sessions, MD, 1 tablet at 09/28/22 0806  EPINEPHrine (EPI-PEN) auto-injector 0.3 mg, 0.3 mg, Intramuscular, Q15 MIN PRN, Chrystine Oiler, MD  EPINEPHrine (EPI-PEN) auto-injector 0.3 mg, 0.3 mg, Intramuscular, Q15 MIN PRN, Chrystine Oiler, MD  famotidine (PF) (PEPCID) 20 mg in sodium chloride 0.9% PF 5 mL Injection, 20 mg, IV Push, Once PRN, Chrystine Oiler, MD  Melene Muller ON 09/29/2022] filgrastim-sndz (ZARXIO) syringe 480 mcg, 5 mcg/kg (Treatment Plan Recorded), Subcutaneous, Daily (1700), Chrystine Oiler, MD  finasteride (PROSCAR) tablet 5 mg, 5 mg, Oral, Daily, Al-Musa, Amer, MD, 5 mg at 09/28/22 0943  hydrocortisone sodium succinate (PF) (Solu-CORTEF) injection 100 mg, 100 mg, IV Push, Once PRN, Chrystine Oiler, MD  isavuconazonium (Cresemba) capsule Cap 372 mg, 372 mg, Oral, Daily, Chrystine Oiler, MD, 372 mg at 09/28/22 0943  latanoprost (XALATAN) 0.005 % ophthalmic solution 1 drop, 1 drop, Both Eyes, Nightly, Al-Musa, Amer, MD, 1 drop at 09/27/22 2119  loperamide (IMODIUM) capsule 2 mg, 2 mg, Oral, 4x DAILY PRN, Roetta Sessions, MD,  2 mg at 09/28/22 0231  LORazepam (ATIVAN) tablet 0.5 mg, 0.5 mg, Oral, Q6H PRN, Chrystine Oiler, MD  mesna (MESNEX) 2,650 mg in sodium chloride 0.9% 500 mL infusion, 25 mg/kg (Treatment Plan Recorded), Intravenous, Q24H, Chrystine Oiler, MD, Last Rate: 20.8 mL/hr at 09/28/22 1045, 2,650 mg at 09/28/22 1045  normal saline 0.9 % oral rinse 30 mL, 30 mL, Oral, 4x DAILY PRN, William Hamburger, MD  ondansetron (PF) [facility]) injection 8 mg, 8 mg, IV Push, Q12H, Al-Musa, Amer, MD  piperacillin-tazobactam (ZOSYN) 4.5 g in sodium chloride 0.9% 100 mL (mini-bag plus), 4.5 g, Intravenous, Q6H, William Hamburger, MD, Last Rate: 33.3 mL/hr at 09/28/22 0757, 4.5 g at 09/28/22 0757  potassium chloride 20 mEq, sodium bicarbonate 50 mEq in D5 1/2 NS 1,000 mL infusion, 150 mL/hr, Intravenous, Continuous, Chrystine Oiler, MD, Last Rate: 200 mL/hr at 09/28/22 0803, 200 mL/hr at 09/28/22 0803  potassium chloride in sterile water 20 mEq/100 mL IVPB 20 mEq, 20 mEq, Intravenous, Q1H PRN, Magdalene River, MD, Last Rate: 100 mL/hr at 09/28/22 1054, 20 mEq at 09/28/22 1054  potassium chloride in sterile water 20 mEq/50 mL IVPB 20 mEq, 20 mEq, Intravenous, Q1H PRN, Magdalene River, MD  prochlorperazine edisylate (COMPAZINE) injection 10 mg, 10 mg, IV Push, Q6H PRN, Chrystine Oiler, MD, 10 mg at 09/25/22 1056  sodium chloride 0.9 % (new bag) bolus 500 mL, 500 mL, Intravenous, Once PRN, Chrystine Oiler, MD  sodium chloride 0.9 % flush 10 mL, 10 mL, IV Push, PRN for Line Care, Chrystine Oiler, MD, 10 mL at 09/27/22 1239  sodium chloride 0.9 % flush 3 mL, 3 mL, IV Push, Q8H, Al-Musa, Amer, MD, 3 mL at 09/28/22 0500  sodium chloride 0.9 % flush 3 mL, 3 mL, IV Push, PRN for Line Care, Roetta Sessions, MD  sodium chloride 0.9% infusion, 5 mL/hr, Intravenous, PRN, Chrystine Oiler, MD  Melene Muller ON 09/29/2022] tacrolimus (PROGRAF) immediate release capsule 3 mg, 3 mg, Oral, Q12H (0600-1800), Chrystine Oiler, MD  ursodioL (URSO 250) tablet 500 mg, 500 mg, Oral, BID, Chrystine Oiler, MD, 500 mg at 09/28/22 0943Objective: Temp:  [97.6 ?F (36.4 ?C)-100.2 ?F (37.9 ?C)] 98.1 ?F (36.7 ?C)Pulse:  [55-88] 55Resp:  [14-18] 18BP: (114-135)/(64-70) 125/68SpO2:  [94 %-98 %] 98 %I/O's:Gross Totals (Last 24 hours) at 09/28/2022 1115Last data filed at 09/28/2022 0900Intake 2168.29 ml Output 2530 ml Net -361.71 ml Wt: 09/28/22 108.5 kg 09/16/22 104.2 kg 09/13/22 104.3 kg 09/11/22 105.9 kg 09/09/22 105.4 kg 09/04/22 106.4 kg Physical ExamConstitutional:     Appearance: Normal appearance. He is normal weight. He is not ill-appearing or diaphoretic. HENT:    Head: Normocephalic.    Nose: Nose normal.    Mouth/Throat:    Mouth: Mucous membranes are moist.    Pharynx: Oropharynx is clear. No oropharyngeal exudate. Eyes:    General: No scleral icterus.      Right eye: No discharge.       Left eye: No discharge. Cardiovascular:    Rate and Rhythm: Normal rate and regular rhythm.    Heart sounds: Normal heart sounds. No murmur heard.Pulmonary:    Effort: Pulmonary effort is normal. No respiratory distress.    Breath sounds: Normal breath sounds. No wheezing or rales. Abdominal:    General: Abdomen is flat. Bowel sounds are normal. There is no distension.    Palpations: Abdomen is soft.    Tenderness: There is no abdominal tenderness. There is no guarding. Musculoskeletal:       General: No swelling or deformity. Normal range  of motion.    Right lower leg: No edema.    Left lower leg: No edema. Skin:   General: Skin is warm.    Findings: Rash (left eyelid swollen with ?purpura, erythema over forehead) present.    Comments: Toes hyperpigmetned/?PVD - not newBruises, petechiae scattered Neurological:    Mental Status: He is alert and oriented to person, place, and time. Psychiatric:       Mood and Affect: Mood normal. Labs:Recent Results (from the past 24 hour(s)) CBC without differential  Collection Time: 09/27/22  6:15 PM Result Value Ref Range  WBC 0.2 (L) 4.0 - 11.0 x1000/?L  RBC 2.44 (L) 4.00 - 6.00 M/?L  Hemoglobin 6.9 (L) 13.2 - 17.1 g/dL  Hematocrit 10.27 (L) 25.36 - 50.00 %  MCV 83.2 80.0 - 100.0 fL  MCH 28.3 27.0 - 33.0 pg  MCHC 34.0 31.0 - 36.0 g/dL  RDW-CV 64.4 03.4 - 74.2 %  Platelets 11 (L) 150 - 420 x1000/?L  MPV    ANC (Abs Neutrophil Count) 0.06 (L) 2.00 - 7.60 x 1000/?L Immature Platelet Fraction (BH GH LMW YH)  Collection Time: 09/27/22  6:15 PM Result Value Ref Range  Immature Platelet Fraction 1.0 (L) 1.2 - 8.6 %  Absolute Immature Platelet Fraction 0.1 <20.0 x1000/?L Post TRXN phase 2 (BH GH LMW YH)  Collection Time: 09/27/22  6:29 PM Result Value Ref Range  ABO Grouping A   Rh Type POS   Antibody Screen NEG   DAT NEG  Prepare RBC No modification  Collection Time: 09/27/22  6:29 PM Result Value Ref Range  BB Product ABO A   BB Product RH POS   BB Product Unit/Donor Number [id number]   BB Product Status TXD   BB Product Code Barcode E0332V00   BB Product ABO/RH Type Barcode 6200   BB Expiration Date [id number]  Sodium  Collection Time: 09/28/22  1:37 AM Result Value Ref Range  Sodium 138 136 - 144 mmol/L Type and screen  Collection Time: 09/28/22  1:37 AM Result Value Ref Range  ABO Grouping A   Rh Type POS   Antibody Screen NEG   Specimen Expiration Date And Time 10/01/2022 23:59  EKG  Collection Time: 09/28/22  4:58 AM Result Value Ref Range  Heart Rate 57 bpm  QRS Interval 93 ms  QT Interval 466 ms  QTC Interval 456 ms  P Axis 71 deg  QRS Axis 21 deg  T Wave Axis 27 deg  P-R Interval 212 msec  SEVERITY Borderline ECG severity Magnesium  Collection Time: 09/28/22  5:04 AM Result Value Ref Range  Magnesium 2.2 1.7 - 2.4 mg/dL Comprehensive metabolic panel Collection Time: 09/28/22  5:04 AM Result Value Ref Range  Sodium 137 136 - 144 mmol/L  Potassium 3.5 3.3 - 5.3 mmol/L  Chloride 108 (H) 98 - 107 mmol/L  CO2 21 20 - 30 mmol/L  Anion Gap 8 7 - 17  Glucose 95 70 - 100 mg/dL  BUN 5 (L) 8 - 23 mg/dL  Creatinine 0.16 0.10 - 1.30 mg/dL  Calcium 7.9 (L) 8.8 - 10.2 mg/dL  BUN/Creatinine Ratio 6.2 (L) 8.0 - 23.0  Total Protein 5.8 (L) 5.9 - 8.3 g/dL  Albumin 2.9 (L) 3.6 - 5.1 g/dL  Total Bilirubin 0.6 <=9.3 mg/dL  Alkaline Phosphatase 78 9 - 122 U/L  Alanine Aminotransferase (ALT) 19 9 - 59 U/L  Aspartate Aminotransferase (AST) 20 10 - 35 U/L  Globulin 2.9 2.0 - 3.9 g/dL  A/G Ratio 1.0 1.0 -  2.2  AST/ALT Ratio 1.1 Reference Range Not Established  eGFR (Creatinine) >60 >=60 mL/min/1.73m2 CBC auto differential  Collection Time: 09/28/22  5:04 AM Result Value Ref Range  WBC 0.1 (L) 4.0 - 11.0 x1000/?L  RBC 2.56 (L) 4.00 - 6.00 M/?L  Hemoglobin 7.4 (L) 13.2 - 17.1 g/dL  Hematocrit 56.21 (L) 30.86 - 50.00 %  MCV 84.0 80.0 - 100.0 fL  MCH 28.9 27.0 - 33.0 pg  MCHC 34.4 31.0 - 36.0 g/dL  RDW-CV 57.8 (H) 46.9 - 15.0 %  Platelets 7 (LL) 150 - 420 x1000/?L  MPV    Neutrophils    Lymphocytes    Monocytes    Eosinophils    Basophil    Immature Granulocytes    nRBC 0.0 0.0 - 1.0 %  Absolute Lymphocyte Count    Monocyte Absolute Count    Eosinophil Absolute Count    Basophil Absolute Count    Absolute Immature Granulocyte Count    Absolute nRBC 0.00 0.00 - 1.00 x 1000/?L  ANC (Abs Neutrophil Count) 0.02 (L) 2.00 - 7.60 x 1000/?L Immature Platelet Fraction (BH GH LMW YH)  Collection Time: 09/28/22  5:04 AM Result Value Ref Range  Immature Platelet Fraction 0.6 (L) 1.2 - 8.6 %  Absolute Immature Platelet Fraction 0.0 <20.0 x1000/?L Urinalysis-macroscopic w/reflex microscopic  Collection Time: 09/28/22  8:02 AM Result Value Ref Range  Clarity, UA Clear Clear  Color, UA Colorless Yellow, Colorless  Specific Gravity, UA 1.012 1.005 - 1.030  pH, UA 6.0 5.5 - 7.5  Protein, UA Negative Negative, Trace  Glucose, UA Negative Negative  Ketones, UA 2+ (A) Negative  Blood, UA Negative Negative  Bilirubin, UA Negative Negative  Leukocytes, UA Negative Negative  Nitrite, UA Negative Negative  Urobilinogen, UA <2.0 <=2.0 mg/dL Diagnostics:No results found.Assessment: Douglas [name] is a 72 y.o. 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 He is s/p 2 cycles of chemotherapy with Decitabine + Venetoclax with reduction in blast count. 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  Plan:  Day +4:  Conditioning/Transplant: (completed)-Melphalan 70 mg/m2 over 30 minutes on D-6, *Melphalan dosed reduced to 70 mg/m2 due to age-Fludarabine 40 mg/m2 over 30 minutes every 24hr for 4 doses from D-5 to D -2             -PT cyclophosphamide 25 mg/kg IV over 1.5 hrs every 24hr for 2 doses on D +3 & D +4 (reduced by 50% to 25 mg/kg due 10/10 match and age)-Mesna 50 mg/kg CIVI daily, for 2 doses, starting 15 minutes before cyclophosphamide on D +3 & D +4-Stem cell infusion day 0 (09/24/22) HEME: 1 unit platelets today for count of 7- Transfuse pRBCs to maintain Hb >7 g/dL, platelets >62 or if clinically indicated- Filgrastim 76mcg/kg starting on day +5 until stable engraftment of neutrophils.ID: Prophylactic antimicrobials: Acyclovir 400 mg PO TID (home medications), Ciprofloxacin 500 mg po twice daily (on hold while patient on Zosyn), Isavuconazole 372 mg po daily (prior authorization pending, voriconazole allergy-gingival hyperplasia), Pentamidine 300 mg inhalation monthly- CMV -/pending, attempt to get letermovir for CMV prophylaxis. Will send script on Day +7 to determine insurance coverage before starting inpatient.Check CMV PCR weekly.- If febrile, panculture, UA and CXR, start broad spectrum empiric antibiotics if neutropenic or septic appearing.   - Repeat blood cx every 24 hours with temperature > 100.4 or septic  appearing GVHD Prophylaxis:  - Tacrolimus  3mg  PO starting day +5.  Goal of 6-11ng/ml- PT cyclophosphamide 25 mg/kg IV over 1.5 hrs every 24hr for 2 doses on D +3 & D +4 with mesna daily on Day +3 and Day +4  (PTCy dose reduced by 50% )- Monitor levels, haptoglobin, LDH, triglycerides.- Daily skin exam, liver function tests twice weekly (minimum), document stool output/day. Fluids, Electrolytes and Nutrition:- Regular diet, consider nutrition consult if needed- Daily weights- Strict Intake and output- Replete electrolytes PRNEmesis prophylaxis: Ondansetron 16 mg PO every 12 hours w/ PRN Compazine, Dexamethasone 12mg  po premed prior to melphalan High dose cyclophosphamide: doxepin premed 25 mg and diphenhydramine 25 mg 30 mins before Cytoxan; D5W 1/2NS + KCL 20 meq + Magnesium 1 gm alternating with D5W + of NaCHO3 + KCL 20 meq until 24 hours after last Cyclophosphamide dose (hold fluid during Cytoxan infusion)Diarrhea: imodium 2mg  PRN 4 times daily, lomotil 2.5-0.025 4x daily PRN  VOD: - Continue Ursodiol 500mg  PO BID, continue until Day +90- Daily weights, strict fluid monitoring of all intake/output- Monitor liver function tests and coags at least twice weekly HTNPatient reports not taking his antiHTN meds for the last few days due to feeling weak. - Will hold home antihypertensive meds for now - monitor BP and reorder as appropriate RashMaculopapular on back and abdomen, first noted 8/10. Unclear etiology so far.Forehead erythema and left eyelid erythema and swelling with ?purpura on eyelid- Will consult derm CVAD: [facility]ickman for chemotherapy and supportive care Discharge Planning: Pending count recovery 24 hour caregiver: Signed:Amer Al-Musa, MDPGY-2, Internal Medicine8/01/2023 I saw and evaluated the patient. Agree with the history, physical exam and plan as documented by the resident Dr. Carolyne Littles with the following additions/modifications.  Plan discussed with patient and the team on rounds.D+4 post transplantChemo associated pancytopeniaPRN transfusion per protocolMonitor for neutropenic fever in this immunocompromised patientSkin rash- ? Chemo- will get derm to review

## 2022-09-28 NOTE — Plan of Care
Plan of Care Overview/ Patient Status    0700-1530Patient is Day +4 s/p a MUDSCT for his AML.He is alert and oriented times four and the assist of one to the toilet. He tolerated his last dose of Cytoxan 2640 mg in 500 cc of normal saline over 1.5 hours.His urine continues to dip negative for blood. He was transfused platelets for a count of 7,000 without problems. He was replaced potassium chloride per sliding scale for a level of 3.5. He was given lomotil alternating with imodium  for his diarrhea. He was given IV lasix 20 mg this morning and put out good urine. He was afebrile on zosyn,acyclovir,and cresemba. Derm was called for a consult for his body rash. He was given ice for his swollen left eye which helped. He is on fall,neutropenic, and protective precautions and bed alarm for his safety. His wife is at the bedside for support sleeping over for three days. His post hydration continues at 150 cc per hour until tomorrow morning.

## 2022-09-28 NOTE — Progress Notes
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 Has not taken compazine yet. Had a loose BM this AM, c diff -ve. Review of Systems Constitutional: Negative.  Negative for chills, fever and malaise/fatigue. HENT: Negative.  Negative for sore throat.  Eyes: Negative.  Respiratory: Negative.  Negative for cough, sputum production and shortness of breath.  Cardiovascular: Negative.  Negative for chest pain, palpitations and leg swelling. Gastrointestinal: Negative.  Negative for abdominal pain, constipation, diarrhea, nausea and vomiting. Genitourinary:  Negative for dysuria, frequency and urgency. Musculoskeletal: Negative.  Skin: Negative.  Negative for itching and rash.      Toes discolored - not new Neurological: Negative.  Psychiatric/Behavioral: Negative.   Review of Allergies/Meds/Hx: Allergies Allergen Reactions  Voriconazole Other (See Comments)   Gingival edema/redness making it difficult to bite down  Medications:Current Facility-Administered Medications:   acetaminophen (TYLENOL) tablet 650 mg, 650 mg, Oral, Q6H PRN, William Hamburger, MD, 650 mg at 09/27/22 1845  acyclovir (ZOVIRAX) capsule 400 mg, 400 mg, Oral, Q8H, Chrystine Oiler, MD, 400 mg at 09/27/22 1309  albuterol neb sol 2.5 mg/3 mL (0.083%) (PROVENTIL,VENTOLIN), 2.5 mg, Nebulization, Q30 MIN PRN, Chrystine Oiler, MD  chlorhexidine gluconate (PERIDEX) 0.12 % solution 15 mL, 15 mL, Mouth/Throat, BID, Al-Musa, Amer, MD, 15 mL at 09/27/22 1135  [Held by provider] ciprofloxacin HCl (CIPRO) tablet 500 mg, 500 mg, Oral, Q12H, Chrystine Oiler, MD, 500 mg at 09/26/22 2115  cycloPHOSphamide (CYTOXAN) 2,640 mg in sodium chloride 0.9% 500 mL infusion, 25 mg/kg (Treatment Plan Recorded), Intravenous, Q24H, Chrystine Oiler, MD, Stopped at 09/27/22 1445  D5 1/2 NS 1,000 mL with potassium chloride 20 mEq, magnesium sulfate 1 g infusion, 150 mL/hr, Intravenous, Continuous, Chrystine Oiler, MD, Last Rate: 150 mL/hr at 09/27/22 1445, 150 mL/hr at 09/27/22 1445  diphenhydrAMINE (BENADRYL) injection 25 mg, 25 mg, IV Push, Q24H, Chrystine Oiler, MD, 25 mg at 09/27/22 1134  diphenhydrAMINE (BENADRYL) injection 50 mg, 50 mg, IV Push, Once PRN, Chrystine Oiler, MD  doxepin (SINEquan) capsule 25 mg, 25 mg, Oral, Q24H, Chrystine Oiler, MD, 25 mg at 09/27/22 1134  EPINEPHrine (EPI-PEN) auto-injector 0.3 mg, 0.3 mg, Intramuscular, Q15 MIN PRN, Chrystine Oiler, MD  EPINEPHrine (EPI-PEN) auto-injector 0.3 mg, 0.3 mg, Intramuscular, Q15 MIN PRN, Chrystine Oiler, MD  famotidine (PF) (PEPCID) 20 mg in sodium chloride 0.9% PF 5 mL Injection, 20 mg, IV Push, Once PRN, Chrystine Oiler, MD  Melene Muller ON 09/29/2022] filgrastim-sndz (ZARXIO) syringe 480 mcg, 5 mcg/kg (Treatment Plan Recorded), Subcutaneous, Daily (1700), Chrystine Oiler, MD  finasteride (PROSCAR) tablet 5 mg, 5 mg, Oral, Daily, Al-Musa, Amer, MD, 5 mg at 09/27/22 0848  hydrocortisone sodium succinate (PF) (Solu-CORTEF) injection 100 mg, 100 mg, IV Push, Once PRN, Chrystine Oiler, MD  isavuconazonium (Cresemba) capsule Cap 372 mg, 372 mg, Oral, Daily, Chrystine Oiler, MD, 372 mg at 09/27/22 0848  latanoprost (XALATAN) 0.005 % ophthalmic solution 1 drop, 1 drop, Both Eyes, Nightly, Al-Musa, Amer, MD, 1 drop at 09/26/22 2229  loperamide (IMODIUM) capsule 2 mg, 2 mg, Oral, 4x DAILY PRN, Roetta Sessions, MD, 2 mg at 09/27/22 1309  LORazepam (ATIVAN) tablet 0.5 mg, 0.5 mg, Oral, Q6H PRN, Chrystine Oiler, MD  mesna (MESNEX) 2,650 mg in sodium chloride 0.9% 500 mL infusion, 25 mg/kg (Treatment Plan Recorded), Intravenous,  Q24H, Chrystine Oiler, MD, Last Rate: 20.8 mL/hr at 09/27/22 1116, 2,650 mg at 09/27/22 1116  normal saline 0.9 % oral rinse 30 mL, 30 mL, Oral, 4x DAILY PRN, William Hamburger, MD  ondansetron (ZOFRAN-ODT) disintegrating tablet 16 mg, 16 mg, Translingual, Q12H, Chrystine Oiler, MD, 16 mg at 09/27/22 1746  piperacillin-tazobactam (ZOSYN) 4.5 g in sodium chloride 0.9% 100 mL (mini-bag plus), 4.5 g, Intravenous, Q6H, William Hamburger, MD, Last Rate: 33.3 mL/hr at 09/27/22 1449, 4.5 g at 09/27/22 1449  potassium chloride 20 mEq, sodium bicarbonate 50 mEq in D5 1/2 NS 1,000 mL infusion, 150 mL/hr, Intravenous, Continuous, Chrystine Oiler, MD, Last Rate: 150 mL/hr at 09/27/22 0520, 150 mL/hr at 09/27/22 0520  potassium chloride in sterile water 20 mEq/100 mL IVPB 20 mEq, 20 mEq, Intravenous, Q1H PRN, Magdalene River, MD, Stopped at 09/26/22 1620  potassium chloride in sterile water 20 mEq/50 mL IVPB 20 mEq, 20 mEq, Intravenous, Q1H PRN, Magdalene River, MD  prochlorperazine edisylate (COMPAZINE) injection 10 mg, 10 mg, IV Push, Q6H PRN, Chrystine Oiler, MD, 10 mg at 09/25/22 1056  sodium chloride 0.9 % (new bag) bolus 500 mL, 500 mL, Intravenous, Once PRN, Chrystine Oiler, MD  sodium chloride 0.9 % flush 10 mL, 10 mL, IV Push, PRN for Line Care, Chrystine Oiler, MD, 10 mL at 09/27/22 1239  sodium chloride 0.9 % flush 3 mL, 3 mL, IV Push, Q8H, Al-Musa, Amer, MD, 3 mL at 09/26/22 1501  sodium chloride 0.9 % flush 3 mL, 3 mL, IV Push, PRN for Line Care, Roetta Sessions, MD  sodium chloride 0.9% infusion, 5 mL/hr, Intravenous, PRN, Chrystine Oiler, MD  Melene Muller ON 09/29/2022] tacrolimus (PROGRAF) immediate release capsule 3 mg, 3 mg, Oral, Q12H (0600-1800), Chrystine Oiler, MD  ursodioL (URSO 250) tablet 500 mg, 500 mg, Oral, BID, Chrystine Oiler, MD, 500 mg at 09/27/22 0848Objective: Temp:  [97.7 ?F (36.5 ?C)-100.5 ?F (38.1 ?C)] 100.2 ?F (37.9 ?C)Pulse:  [67-95] 88Resp:  [14-18] 16BP: (122-148)/(63-73) 125/65SpO2:  [94 %-100 %] 97 %I/O's:Gross Totals (Last 24 hours) at 09/27/2022 1946Last data filed at 09/27/2022 1810Intake 3947.86 ml Output 1425 ml Net 2522.86 ml Wt: 09/27/22 103.7 kg 09/16/22 104.2 kg 09/13/22 104.3 kg 09/11/22 105.9 kg 09/09/22 105.4 kg 09/04/22 106.4 kg Physical ExamConstitutional:     Appearance: Normal appearance. He is normal weight. He is not ill-appearing or diaphoretic. HENT:    Head: Normocephalic.    Nose: Nose normal.    Mouth/Throat:    Mouth: Mucous membranes are moist.    Pharynx: Oropharynx is clear. No oropharyngeal exudate. Eyes:    General: No scleral icterus.      Right eye: No discharge.       Left eye: No discharge. Cardiovascular:    Rate and Rhythm: Normal rate and regular rhythm.    Heart sounds: Normal heart sounds. No murmur heard.Pulmonary:    Effort: Pulmonary effort is normal. No respiratory distress.    Breath sounds: Normal breath sounds. No wheezing or rales. Abdominal:    General: Abdomen is flat. Bowel sounds are normal. There is no distension.    Palpations: Abdomen is soft.    Tenderness: There is no abdominal tenderness. There is no guarding. Musculoskeletal:       General: No swelling or deformity. Normal range of motion.    Right lower leg: No edema.    Left lower leg: No edema. Skin:   General: Skin is warm.    Findings: No rash.    Comments: Toes hyperpigmetned/?PVD - not newBruises, petechiae  scattered Neurological:    Mental Status: He is alert and oriented to person, place, and time. Psychiatric:       Mood and Affect: Mood normal. Labs:Recent Results (from the past 24 hour(s)) Blood culture  Collection Time: 09/27/22 12:08 AM  Specimen: Peripheral; Blood Result Value Ref Range  Blood Culture No Growth to Date  Procalcitonin     (BH GH LMW Q YH) Collection Time: 09/27/22 12:09 AM Result Value Ref Range  Procalcitonin 0.14 See Comment ng/mL Urinalysis-macroscopic w/reflex microscopic  Collection Time: 09/27/22 12:18 AM Result Value Ref Range  Clarity, UA Clear Clear  Color, UA Yellow Yellow, Colorless  Specific Gravity, UA 1.022 1.005 - 1.030  pH, UA 6.0 5.5 - 7.5  Protein, UA 1+ (A) Negative, Trace  Glucose, UA Negative Negative  Ketones, UA Negative Negative  Blood, UA Negative Negative  Bilirubin, UA Negative Negative  Leukocytes, UA Negative Negative  Nitrite, UA Negative Negative  Urobilinogen, UA <2.0 <=2.0 mg/dL Urine microscopic     (BH GH LMW YH)  Collection Time: 09/27/22 12:18 AM Result Value Ref Range  RBC/HPF, UA 1 0 - 2 /HPF  WBC/HPF, UA 2 0 - 5 /HPF MRSA screen by PCR  Collection Time: 09/27/22 12:55 AM  Specimen: Nares; Swab Result Value Ref Range  MRSA Colonization Status by PCR Not Detected Not Detected EKG  Collection Time: 09/27/22  4:47 AM Result Value Ref Range  Heart Rate 73 bpm  QRS Interval 94 ms  QT Interval 425 ms  QTC Interval 468 ms  P Axis 49 deg  QRS Axis 21 deg  T Wave Axis 16 deg  P-R Interval 187 msec  SEVERITY Normal ECG severity CBC auto differential  Collection Time: 09/27/22  5:20 AM Result Value Ref Range  WBC 0.2 (L) 4.0 - 11.0 x1000/?L  RBC 2.18 (L) 4.00 - 6.00 M/?L  Hemoglobin 6.4 (LL) 13.2 - 17.1 g/dL  Hematocrit 91.47 (L) 82.95 - 50.00 %  MCV 83.9 80.0 - 100.0 fL  MCH 29.4 27.0 - 33.0 pg  MCHC 35.0 31.0 - 36.0 g/dL  RDW-CV 62.1 30.8 - 65.7 %  Platelets 7 (LL) 150 - 420 x1000/?L  MPV    Neutrophils    Lymphocytes    Monocytes    Eosinophils    Basophil    Immature Granulocytes    nRBC 0.0 0.0 - 1.0 %  Absolute Lymphocyte Count    Monocyte Absolute Count    Eosinophil Absolute Count    Basophil Absolute Count    Absolute Immature Granulocyte Count    Absolute nRBC 0.00 0.00 - 1.00 x 1000/?L  ANC (Abs Neutrophil Count) 0.10 (L) 2.00 - 7.60 x 1000/?L Comprehensive metabolic panel  Collection Time: 09/27/22  5:20 AM Result Value Ref Range  Sodium 137 136 - 144 mmol/L  Potassium 3.7 3.3 - 5.3 mmol/L  Chloride 105 98 - 107 mmol/L  CO2 21 20 - 30 mmol/L  Anion Gap 11 7 - 17  Glucose 118 (H) 70 - 100 mg/dL  BUN 8 8 - 23 mg/dL  Creatinine 8.46 9.62 - 1.30 mg/dL  Calcium 8.4 (L) 8.8 - 10.2 mg/dL  BUN/Creatinine Ratio 9.0 8.0 - 23.0  Total Protein 6.3 5.9 - 8.3 g/dL  Albumin 3.1 (L) 3.6 - 5.1 g/dL  Total Bilirubin 0.6 <=9.5 mg/dL  Alkaline Phosphatase 86 9 - 122 U/L  Alanine Aminotransferase (ALT) 17 9 - 59 U/L  Aspartate Aminotransferase (AST) 21 10 - 35 U/L  Globulin 3.2 2.0 - 3.9 g/dL  A/G Ratio 1.0 1.0 - 2.2  AST/ALT Ratio 1.2 Reference Range Not Established  eGFR (Creatinine) >60 >=60 mL/min/1.82m2 Immature Platelet Fraction (BH GH LMW YH)  Collection Time: 09/27/22  5:20 AM Result Value Ref Range  Immature Platelet Fraction 0.8 (L) 1.2 - 8.6 %  Absolute Immature Platelet Fraction 0.1 <20.0 x1000/?L Urinalysis-macroscopic w/reflex microscopic  Collection Time: 09/27/22  7:40 AM Result Value Ref Range  Clarity, UA Clear Clear  Color, UA Yellow Yellow, Colorless  Specific Gravity, UA 1.015 1.005 - 1.030  pH, UA 6.0 5.5 - 7.5  Protein, UA Trace Negative, Trace  Glucose, UA Negative Negative  Ketones, UA Negative Negative  Blood, UA Trace (A) Negative  Bilirubin, UA Negative Negative  Leukocytes, UA Negative Negative  Nitrite, UA Negative Negative  Urobilinogen, UA <2.0 <=2.0 mg/dL Urine microscopic     (BH GH LMW YH)  Collection Time: 09/27/22  7:40 AM Result Value Ref Range  RBC/HPF, UA 2 0 - 2 /HPF  WBC/HPF, UA 2 0 - 5 /HPF  Bacteria, UA Rare None-Rare /HPF  Urine Squamous Epithelial Cells, UA <1 0 - 5 /HPF Post TRXN phase 2 (BH GH LMW YH)  Collection Time: 09/27/22  6:29 PM Result Value Ref Range  ABO Grouping A   Rh Type POS   Antibody Screen NEG   DAT NEG  Diagnostics:XR Chest PA or AP (Portable)Result Date: 09/27/2022 No acute cardiopulmonary disease. [facility] Radiology Notify System Classification: Routine. Reported and signed by: Dulce Sellar, MD  [facility] Radiology and Biomedical Imaging  Assessment: Douglas [name] is a 72 y.o. 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 He is s/p 2 cycles of chemotherapy with Decitabine + Venetoclax with reduction in blast count. 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  Plan:  Day +3: Febrile non hemolytic reactionNeutropenic feverD/c vancoContinue zosynWill start PTCY Conditioning/Transplant: CMML -Melphalan 70 mg/m2 over 30 minutes on D-6, *Melphalan dosed reduced to 70 mg/m2 due to age-Fludarabine 40 mg/m2 over 30 minutes every 24hr for 4 doses from D-5 to D -2             -PT cyclophosphamide 25 mg/kg IV over 1.5 hrs every 24hr for 2 doses on D +3 & D +4 (reduced by 50% to 25 mg/kg due 10/10 match and age)-Mesna 50 mg/kg CIVI daily, for 2 doses, starting 15 minutes before cyclophosphamide on D +3 & D +4-Stem cell infusion day 0 (09/24/22) HEME: - Transfuse pRBCs to maintain Hb >7 g/dL, platelets >16 or if clinically indicated- Filgrastim 40mcg/kg starting on day +5 until stable engraftment of neutrophils. ID: Prophylactic antimicrobials: Acyclovir 400 mg PO TID (home medications), Ciprofloxacin 500 mg po twice daily, Isavuconazole 372 mg po daily (prior authorization pending, voriconazole allergy-gingival hyperplasia), Pentamidine 300 mg inhalation monthly- CMV -/pending, attempt to get letermovir for CMV prophylaxis. Will send script on Day +7 to determine insurance coverage before starting inpatient.Check CMV PCR weekly.- If febrile, panculture, UA and CXR, start broad spectrum empiric antibiotics if neutropenic or septic appearing.   - Repeat blood cx every 24 hours with temperature > 100.4 or septic appearing GVHD Prophylaxis:  - Tacrolimus  3mg  PO starting day +5.  Goal of 6-11ng/ml- PT cyclophosphamide 25 mg/kg IV over 1.5 hrs every 24hr for 2 doses on D +3 & D +4 with mesna daily on Day +3 and Day +4  (PTCy  dose reduced by 50% )- Monitor levels, haptoglobin, LDH, triglycerides.- Daily skin exam, liver function tests twice weekly (minimum), document stool output/day. Fluids, Electrolytes and Nutrition:- Regular diet, consider nutrition consult if needed- Daily weights- Strict Intake and output- Replete electrolytes PRNEmesis prophylaxis: Ondansetron 16 mg PO every 12 hours w/ PRN Compazine, Dexamethasone 12mg  po premed prior to melphalan High dose cyclophosphamide: doxepin premed 25 mg and diphenhydramine 25 mg 30 mins before Cytoxan; D5W 1/2NS + KCL 20 meq + Magnesium 1 gm alternating with D5W + of NaCHO3 + KCL 20 meq until 24 hours after last Cyclophosphamide dose (hold fluid during Cytoxan infusion)Diarrhea: imodium 2mg  PRN 4 times daily VOD: - Continue Ursodiol 500mg  PO BID, continue until Day +90- Daily weights, strict fluid monitoring of all intake/output- Monitor liver function tests and coags at least twice weekly HTNPatient reports not taking his antiHTN meds for the last few days due to feeling weak. - Will hold home antihypertensive meds for now - monitor BP and reorder as appropriate  CVAD: Hickman for chemotherapy and supportive care RashMaculopapular on back and abdomen. Unclear etiology so far.Will CTMDischarge Planning: Pending count recovery 24 hour caregiver:

## 2022-09-28 NOTE — Other
Wray Kearns  REQUEST  DOCUMENTATION-[facility] NOTE-Type of consult: [facility] Dermatology -New Consult: [id number] Rodena Goldmann / Location: [id number]/11238-[id number] / Brief Clinical Question: 49M w/ AML, day +4 from allogeneic HPSCT, s/p conditioning w melph/flu, currently on PT Cy, zosyn, now with left eye rash/puffiness (see media) as well as forehead erythema/Callback Cell Phone: covering provider/ Use .consultnotetemplate or add .consultrecommendation to your consult notes. Remind your attending to use .consultattestation /Please confirm receipt of this message by texting back ?OK?-1 - Mobile Heartbeat message sent to Zhou at 11:05 AM. Received response at 1108.Casimiro Needle Hudd8/12/202411:05 [employer] [phone]

## 2022-09-29 LAB — CBC WITH AUTO DIFFERENTIAL
BKR WAM ABSOLUTE NRBC (2 DEC): 0 x 1000/ÂµL (ref 0.00–1.00)
BKR WAM ANC (ABSOLUTE NEUTROPHIL COUNT): 0.01 x 1000/ÂµL — ABNORMAL LOW (ref 2.00–7.60)
BKR WAM HEMATOCRIT (2 DEC): 22.2 % — ABNORMAL LOW (ref 38.50–50.00)
BKR WAM HEMOGLOBIN: 7.6 g/dL — ABNORMAL LOW (ref 13.2–17.1)
BKR WAM MCH (PG): 28.6 pg (ref 27.0–33.0)
BKR WAM MCHC: 34.2 g/dL (ref 31.0–36.0)
BKR WAM MCV: 83.5 fL (ref 80.0–100.0)
BKR WAM NUCLEATED RED BLOOD CELLS: 0 % (ref 0.0–1.0)
BKR WAM PLATELETS: 12 x1000/ÂµL — ABNORMAL LOW (ref 150–420)
BKR WAM RDW-CV: 15.2 % — ABNORMAL HIGH (ref 11.0–15.0)
BKR WAM RED BLOOD CELL COUNT.: 2.66 M/ÂµL — ABNORMAL LOW (ref 4.00–6.00)
BKR WAM WHITE BLOOD CELL COUNT: <0.1 x1000/ÂµL — ABNORMAL LOW (ref 4.0–11.0)

## 2022-09-29 LAB — COMPREHENSIVE METABOLIC PANEL
BKR A/G RATIO: 1 (ref 1.0–2.2)
BKR ALANINE AMINOTRANSFERASE (ALT): 12 U/L (ref 9–59)
BKR ALBUMIN: 3.1 g/dL — ABNORMAL LOW (ref 3.6–5.1)
BKR ALKALINE PHOSPHATASE: 83 U/L (ref 9–122)
BKR ANION GAP: 12 (ref 7–17)
BKR ASPARTATE AMINOTRANSFERASE (AST): 20 U/L (ref 10–35)
BKR AST/ALT RATIO: 1.7
BKR BILIRUBIN TOTAL: 0.6 mg/dL (ref <=1.2)
BKR BLOOD UREA NITROGEN: 5 mg/dL — ABNORMAL LOW (ref 8–23)
BKR BUN / CREAT RATIO: 5.2 — ABNORMAL LOW (ref 8.0–23.0)
BKR CALCIUM: 8 mg/dL — ABNORMAL LOW (ref 8.8–10.2)
BKR CHLORIDE: 107 mmol/L (ref 98–107)
BKR CO2: 20 mmol/L (ref 20–30)
BKR CREATININE: 0.96 mg/dL (ref 0.40–1.30)
BKR EGFR, CREATININE (CKD-EPI 2021): >60 mL/min/{1.73_m2} (ref >=60)
BKR GLOBULIN: 3.2 g/dL (ref 2.0–3.9)
BKR GLUCOSE: 109 mg/dL — ABNORMAL HIGH (ref 70–100)
BKR POTASSIUM: 3.6 mmol/L (ref 3.3–5.3)
BKR PROTEIN TOTAL: 6.3 g/dL (ref 5.9–8.3)
BKR SODIUM: 139 mmol/L (ref 136–144)

## 2022-09-29 LAB — IMMATURE PLATELET FRACTION (BH GH LMW YH)
BKR WAM IPF, ABSOLUTE: 0.1 x1000/ÂµL (ref <20.0)
BKR WAM IPF: 0.8 % — ABNORMAL LOW (ref 1.2–8.6)

## 2022-09-29 LAB — URINALYSIS-MACROSCOPIC W/REFLEX MICROSCOPIC
BKR BILIRUBIN, UA: NEGATIVE
BKR GLUCOSE, UA: NEGATIVE
BKR LEUKOCYTE ESTERASE, UA: NEGATIVE
BKR NITRITE, UA: NEGATIVE
BKR PH, UA: 6 (ref 5.5–7.5)
BKR PROTEIN, UA: NEGATIVE
BKR SPECIFIC GRAVITY, UA: 1.011 (ref 1.005–1.030)
BKR UROBILINOGEN, UA (MG/DL): <2 mg/dL (ref <=2.0)

## 2022-09-29 LAB — NT-PROBNPE: BKR B-TYPE NATRIURETIC PEPTIDE, PRO (PROBNP): 2650 pg/mL — ABNORMAL HIGH (ref <125.0)

## 2022-09-29 LAB — MAGNESIUM: BKR MAGNESIUM: 2.2 mg/dL (ref 1.7–2.4)

## 2022-09-29 LAB — URINE MICROSCOPIC     (BH GH LMW YH)
BKR RBC/HPF INSTRUMENT: <1 /HPF (ref 0–2)
BKR WBC/HPF INSTRUMENT: <1 /HPF (ref 0–5)

## 2022-09-29 LAB — SODIUM: BKR SODIUM: 136 mmol/L (ref 136–144)

## 2022-09-29 MED ORDER — ERYTHROMYCIN 5 MG/GRAM (0.5 %) EYE OINTMENT
5 | Freq: Every evening | OPHTHALMIC | Status: CP
Start: 2022-09-29 — End: ?
  Administered 2022-09-29 – 2022-10-09 (×9): 5 mg/gram (0. %) via OPHTHALMIC

## 2022-09-29 MED ORDER — FUROSEMIDE 10 MG/ML INJECTION SOLUTION
10 | Freq: Once | INTRAVENOUS | Status: CP
Start: 2022-09-29 — End: ?
  Administered 2022-09-29: 06:00:00 10 mL via INTRAVENOUS

## 2022-09-29 MED ORDER — ERYTHROMYCIN 5 MG/GRAM (0.5 %) EYE OINTMENT
5 | Freq: Every evening | OPHTHALMIC | Status: DC
Start: 2022-09-29 — End: 2022-09-29

## 2022-09-29 NOTE — Plan of Care
Plan of Care Overview/ Patient Status    1900-0730: D+4 into D+5 Allo SCT This shift, patient A&O X 4, vital signs stable on room air, assist x1 out of bed w/ a steady gait, Family member at bedside, patient calling appropriately for help.  Voids spontaneously, up to toilet. LBM 09/29/22, diarrhea. No complaints of pain this shift.  Patient had one episode of throwing up phlegm.  Patient complained of nausea intermittently but did not need extra intervention.  Medications taken whole with juice. PRN Imodium administered per [facility] x2. PRN lomotil administered per [facility] x2. Skin present w/ body rash.  Left eye swollen w/ redness, unchanged from previous day.  R DL Hickman, dressing clean, dry, and intact, flushes well without difficulty, blood return present, currently running w/ alternating Type I and Type II fluids, Mesna and Zosyn KVO.   No repletion's this shift.  No blood products administered this shift. Patient turns and repositions independenently.  Patient had inadequate urine output at beginning of shift.  Provider Moreines notified.  Lasix 20mg  IVP ordered and administered per MAR x1.  Patient w/ adequate UOP throughout rest of shift.  Please see MAR and I&O flowsheet for details regarding rate changes and output.  Patients urine began dipping trace hemolyzed and non hemolyzed this AM.  Provider notified.  U/A ordered and collected. No symptoms or complaints noted.  Safety/Protective/Fall precautions maintained this shift. Call bell and personal items within reach. Safety and hourly rounds completed throughout shift. Care plan maintained throughout shift. Please see flow sheets for further details.  Pearline Cables, RNProblem: Adult Inpatient Plan of CareGoal: Plan of Care ReviewOutcome: Interventions implemented as appropriateGoal: Optimal Comfort and WellbeingOutcome: Interventions implemented as appropriate Problem: Fall Injury RiskGoal: Absence of Fall and Fall-Related InjuryOutcome: Interventions implemented as appropriate Problem: Nausea and Vomiting (Chemotherapy Effects)Goal: Fluid and Electrolyte BalanceOutcome: Interventions implemented as appropriate

## 2022-09-29 NOTE — Consults
INPATIENT DERMATOLOGY NEW CONSULTCONSULTATION REQUESTED BY: heme/oncQUESTION: 55M w/ AML, day +4 from allogeneic HPSCT, s/p conditioning w melph/flu, currently on PT Cy, zosyn, now with left eye rash/puffiness (see media) as well as forehead erythema HISTORY OF PRESENT ILLNESS: Douglas [name] is a 72 y.o. male with a history of AML day +4 from alloSCT (melphalan/flutarabine conditioning) currently on cyclophosphamide, tacrolimus, and filgrastim, NMSC including spot on L temple pending Mohs, glaucoma who was admitted for stem cell transplant. Dermatology consulted for new eyebrows/eyelid rash. Patient has two rashes of concern to him.Reports that starting yesterday, he had some redness and swelling of the bilateral eyebrows as well as left eyelid pain. Today, the left eyelid is more swollen and painful than yesterday, and he has a new purple spot on the eyelid that wasn't there yesterday. He denies pain with extraocular movements, vision changes, or light sensitivity. No purulence or discharge. No itchiness. He has been using his home latanoprost eye drops for glaucoma but otherwise denies new topicals to the area. He is on prophylactic PO acyclovir 400mg  q8h. Cyclophosphamide started 2d ago, also on filgrastim.He also reports new back, abdomen, chest rash for a few days. Asymptomatic.Here in hospital he has been febrile, tmax 100.2 last night thought 2/2 transfusion reaction. WBC 0.1, plts 7, ANC 0.02. Blood cx yesterday NGTD.REVIEW OF SYSTEMS:+ fever, + chills, + n/v/dNo rhinorrhea, sore throat, new joint aches.PAST DERM HISTORY: NMSC, [place] EXAM: Temp:  [97.6 ?F (36.4 ?C)-100.2 ?F (37.9 ?C)] 98.1 ?F (36.7 ?C)Pulse:  [55-88] 55Resp:  [14-18] 18BP: (114-135)/(64-70) 125/68SpO2:  [94 %-98 %] 98 %Device (Oxygen Therapy): room airGEN: alert and oriented, NADSKIN: Examined hair, head, conjuctivae, neck, chest, abdomen, back, RUE, LUE, R shin, L shins. Patient deferred buttocks/genitals/upper thigh exam, states nothing there. Exam unremarkable except as noted:- left upper eyelid edematous and pink with purpuric macule near the middle of the eyelid margin. No vesicles or erosions. No conjunctivitis- face including forehead, glabella, R>L cheek with patchy erythema. Bilateral supraorbital ridges somewhat tender to palpation- mid back with few patches of erythema, mostly surrounding benign keratoses- chest and back with red and pink papules with surrounding erythema, few inflamed keratoses- abdomen with few scattered purpuric papulesLABS:Recent Labs Lab 08/10/240538 08/11/240520 08/11/241815 08/12/240504 WBC 0.2* 0.2* 0.2* 0.1* HGB 7.3* 6.4* 6.9* 7.4* HCT 21.50* 18.30* 20.30* 21.50* PLT 14* 7* 11* 7*  Recent Labs Lab 08/10/240538 08/11/240520 08/12/240137 08/12/240504 NA 138 137 138 137 K 3.5 3.7  --  3.5 CL 105 105  --  108* CO2 21 21  --  21 BUN 8 8  --  5* CREATININE 0.85 0.89  --  0.81 GLU 90 118*  --  95 ANIONGAP 12 11  --  8  Recent Labs Lab 08/09/240450 08/10/240538 08/11/240520 08/12/240504 ALT 21 19 17 19  AST 26 19 21 20  ALKPHOS 105 97 86 78 BILITOT 0.4 0.5 0.6 0.6  ASSESSMENT:Douglas [name] is a 72 y.o. male day +4 s/p alloSCT. Dermatology was consulted for left eyelid edema and possible facial rash. Exam notable for left upper eyelid erythema and edema with purpuric macule overlying lid margin and blotchy, tender erythema of the forehead and cheeks.Overall, ddx includes cellulitis (eyelid could be consistent, but would not explain splotchy erythema of the forehead), neutrophilic eccrine hidradenitis (can see tender erythematous edema of the face, often triggered by chemotherapy agents), hordeolum of the eye with rosacea (although would not explain the forehead). For now, would continue with empiric treatment of cellulitis. Patient reports improvement throughout  the course of the day. We will monitor and if worsening tomorrow AM, will consider bx for further diagnostic clarity. RECOMMENDATIONS:- continue abx per primary team- if eyelid symptoms progress, consider ophthalmology consult- we will continue to monitor - if worsening, we may consider bx tomorrowSeen, examined, and discussed with attending, Dr. Noel Gerold. Recommendations are PRELIMINARY until accompanied by attending addendum and signature.We will continue to follow with you.Please call/text DERMATOLOGY DYNAMIC ROLE [facility] ([facility]) and page  [phone] nights/weekends with questions.Adline Potter MD PGY-3, Dermatology Resident [facility] Dermatology Alphonzo Grieve, MDYale Dermatology, PGY-4

## 2022-09-29 NOTE — Plan of Care
Plan of Care Overview/ Patient Status    0700-1530Patient is Day +5 s/p a MUD SCT for his AML.He is alert and oriented times four and independent in his room today. His wife is at the bedside around the clock for support. He is afebrile on zosyn,acyclovir,and cresemba. He was replaced potassium chloride 20 meq IV times two per sliding scale for a level was 3.6. His diarrhea continues with alternating lomotil and imodium. Warm soaks to his left eye were ordered with erythromycin ointment q hs with one dose today to start.He did not have platelets ordered for a count of 12,000 and he had no signs of bleeding except some trace blood in urine this morning. He is on fall,neutropenic,and protective precautions for his safety.

## 2022-09-29 NOTE — Progress Notes
DERMATOLOGY INPATIENT PROGRESS NOTESUBJECTIVE:- afebrile overnight- preliminary biopsy read from mid abdomen with suppurative folliculitisToday patient reports that his face actually feels better today. No longer with tenderness on the forehead or glabella, just a little bit itchy. Is developing new red spots on the arms, legs, abdomen.OBJECTIVE:Physical exam:GEN: alert and oriented, NADSKIN: Examined hair, head, conjuctivae, neck, chest, abdomen, back, RUE, LUE, R shin, L shins. Patient deferred buttocks/genitals/upper thigh exam, states nothing there. Exam unremarkable except as noted:- left upper eyelid edematous and pink with purpuric macule near the middle of the eyelid margin. No vesicles or erosions. No conjunctivitis- face including forehead, glabella, R>L cheek with patchy erythema. Bilateral supraorbital ridges somewhat tender to palpation- mid back with few patches of erythema, mostly surrounding benign keratoses- chest and back with red and pink papules with surrounding erythema, few inflamed keratoses- abdomen, arms, legs with scattered purpuric papulesLabs:8/13WBC <0.1Plt 12Cr 0.96UA with trace bloodPath:8/13 H&E mid abdomen: suppurative folliculitis with hemorrhage8/13 DIF mid abdomen pendingASSESSMENT/PLAN:Douglas [name] is a 72 y.o. male day +4 s/p alloSCT. Dermatology was consulted for left eyelid edema and possible facial rash. Exam notable for 1) left upper eyelid erythema and edema with purpuric macule overlying lid margin and blotchy, tender erythema of the forehead and cheeks and 2) purpuric papules on the abdomen and extremities For 1) the cause of the patchy erythema on the forehead is not immediately clear, but it is now improving and no longer symptomatic. He reports that it acutely started after receiving fluids; he may have developed edema which he bled into because of his thrombocytopenia. Regardless, would not treat or bx at this time. Favor hordeolum of the eyelid causing his edema, and do not feel that treatment for cellulitis is necessary, but will defer to primary team and could use warm compresses to help symptomatically. 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 Preliminary biopsy read with suppurative folliculitis with hemorrhage. Appears purpuric due to thrombocytopenia leading to hemorrhage into the folliculitis. Can treat topically. RECOMMENDATIONS:- START warm compresses with sterile water PRN- follow-up final H&E and DIF of mid abdomen bx- START BPO wash followed by clindamycin 1% lotion or solution BID to the body if desired- leave pressure bandage on until tomorrow, then can replace with regular bandaidPlease call/text DERMATOLOGY DYNAMIC ROLE Y[facility]/SRC ([facility]) and page [phone] nights/weekends with questions.Patient seen with attending Dr. Sherlynn [name].Dermatology will sign off at this time. Please reach out to on call dermatology resident with additional questions or concerns.Douglas [name], MDPGY-4, Dermatology Resident [facility] Dermatology ServiceSKIN BIOPSY PROCEDURE NOTE:After informed consent was obtained, the lesion was anesthetized using 1% lidocaine with epinephrine. Two 4mm punch biopsies were performed. Absorbable sutures were placed.Wound was dressed with Vaseline and a bandaid. Wound care instructions were given and written below. There were no complications.WOUND CARE INSTRUCTIONS:1.  Leave bandage on until next morning2.  Gently cleanse wound daily with soap and water, then apply a small amount of Vaseline. Do not use any antibiotic ointment unless otherwise specified.3.  Keep wound moist with Vaseline until healed, approximately 1 week. Apply bandage as necessary.4.  Call if significant pain or redness occurs.Time Out DocumentationProcedure Name: punch biopsiesProcedure Time: 09/29/2022 9:00 AMTeam Members Present: Douglas [name], MDTime Out initiated after patient positioned & prior to beginning of procedure: YesName of Patient  & MRN # or DOB stated and matches ID band or previously confirmed med record number: YesProceduralist states or confirms procedure to be performed: YesProcedural consent is used to verify procedure performed  &  matches pt identifiers:YesSite of procedure(s) (with laterality or level) is topically marked per policy & visible after draping:Yes

## 2022-09-29 NOTE — Plan of Care
Plan of Care Overview/ Patient Status    Problem: Adult Inpatient Plan of CareGoal: Readiness for Transition of CareOutcome: Interventions implemented as appropriate LOS note: 11 daysPt discussed during TC rounds, not medically ready for discharge at this time. Current plan is for pt to return home upon discharge. His need for home care services TBD pending clinical course and functional abilities at the time of medical readiness. Elinor Parkinson, RN, MSNFloat Care ManagerMHB: [phone]

## 2022-09-29 NOTE — Progress Notes
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 Some diarrhea and nausea o/n. Review of Systems Constitutional:  Positive for malaise/fatigue. Negative for chills and fever. HENT: Negative.  Negative for sore throat.  Eyes: Negative.  Respiratory: Negative.  Negative for cough, sputum production, shortness of breath and wheezing.  Cardiovascular: Negative.  Negative for chest pain, palpitations and leg swelling. Gastrointestinal:  Positive for diarrhea and nausea. Negative for abdominal pain, constipation and vomiting. Genitourinary:  Negative for dysuria, frequency and urgency. Musculoskeletal: Negative.  Skin:  Positive for rash. Negative for itching.      Toes discolored - not new Neurological: Negative.  Psychiatric/Behavioral: Negative.   Review of Allergies/Meds/Hx: Allergies Allergen Reactions  Voriconazole Other (See Comments)   Gingival edema/redness making it difficult to bite down  Medications:Current Facility-Administered Medications:   acetaminophen (TYLENOL) tablet 650 mg, 650 mg, Oral, Q6H PRN, Flonnie Hailstone, [place], PA  acyclovir (ZOVIRAX) capsule 400 mg, 400 mg, Oral, Q8H, Chrystine Oiler, MD, 400 mg at 09/29/22 0445  albuterol neb sol 2.5 mg/3 mL (0.083%) (PROVENTIL,VENTOLIN), 2.5 mg, Nebulization, Q30 MIN PRN, Chrystine Oiler, MD  chlorhexidine gluconate (PERIDEX) 0.12 % solution 15 mL, 15 mL, Mouth/Throat, BID, Al-Musa, Amonda Brillhart, MD, 15 mL at 09/28/22 0944  D5 1/2 NS 1,000 mL with potassium chloride 20 mEq, magnesium sulfate 1 g infusion, 150 mL/hr, Intravenous, Continuous, Al-Musa, Declan Adamson, MD, Last Rate: 150 mL/hr at 09/29/22 0231, 150 mL/hr at 09/29/22 0231  diphenhydrAMINE (BENADRYL) injection 50 mg, 50 mg, IV Push, Once PRN, Chrystine Oiler, MD  diphenoxylate-atropine (LOMOTIL) 2.5-0.025 mg per tablet 1 tablet, 1 tablet, Oral, 4x DAILY PRN, Roetta Sessions, MD, 1 tablet at 09/28/22 2349  EPINEPHrine (EPI-PEN) auto-injector 0.3 mg, 0.3 mg, Intramuscular, Q15 MIN PRN, Chrystine Oiler, MD  EPINEPHrine (EPI-PEN) auto-injector 0.3 mg, 0.3 mg, Intramuscular, Q15 MIN PRN, Chrystine Oiler, MD  famotidine (PF) (PEPCID) 20 mg in sodium chloride 0.9% PF 5 mL Injection, 20 mg, IV Push, Once PRN, Chrystine Oiler, MD  filgrastim-sndz (ZARXIO) syringe 480 mcg, 5 mcg/kg (Treatment Plan Recorded), Subcutaneous, Daily (1700), Chrystine Oiler, MD  finasteride (PROSCAR) tablet 5 mg, 5 mg, Oral, Daily, Al-Musa, Damyn Weitzel, MD, 5 mg at 09/28/22 0943  furosemide (LASIX) injection 20 mg, 20 mg, Intravenous, Once, Al-Musa, Denyse Dago, MD  hydrocortisone sodium succinate (PF) (Solu-CORTEF) injection 100 mg, 100 mg, IV Push, Once PRN, Chrystine Oiler, MD  isavuconazonium (Cresemba) capsule Cap 372 mg, 372 mg, Oral, Daily, Chrystine Oiler, MD, 372 mg at 09/28/22 0943  latanoprost (XALATAN) 0.005 % ophthalmic solution 1 drop, 1 drop, Both Eyes, Nightly, Al-Musa, Stellarose Cerny, MD, 1 drop at 09/27/22 2119  loperamide (IMODIUM) capsule 2 mg, 2 mg, Oral, 4x DAILY PRN, Roetta Sessions, MD, 2 mg at 09/29/22 0446  LORazepam (ATIVAN) tablet 0.5 mg, 0.5 mg, Oral, Q6H PRN, Chrystine Oiler, MD  mesna (MESNEX) 2,650 mg in sodium  chloride 0.9% 500 mL infusion, 25 mg/kg (Treatment Plan Recorded), Intravenous, Q24H, Chrystine Oiler, MD, Last Rate: 20.8 mL/hr at 09/28/22 1045, 2,650 mg at 09/28/22 1045  normal saline 0.9 % oral rinse 30 mL, 30 mL, Oral, 4x DAILY PRN, William Hamburger, MD  ondansetron (PF) [facility]) injection 8 mg, 8 mg, IV Push, Q12H, Al-Musa, Isobel Eisenhuth, MD, 8 mg at 09/29/22 0443  piperacillin-tazobactam (ZOSYN) 4.5 g in sodium chloride 0.9% 100 mL (mini-bag plus), 4.5 g, Intravenous, Q6H, William Hamburger, MD, Last Rate: 33.3 mL/hr at 09/29/22 0123, 4.5 g at 09/29/22 0123  potassium chloride 20 mEq, sodium bicarbonate 50 mEq in D5 1/2 NS 1,000 mL infusion, 150 mL/hr, Intravenous, Continuous, Al-Musa, Adra Shepler, MD, Last Rate: 150 mL/hr at 09/29/22 0754, 150 mL/hr at 09/29/22 0754  potassium chloride in sterile water 20 mEq/100 mL IVPB 20 mEq, 20 mEq, Intravenous, Q1H PRN, Magdalene River, MD, Last Rate: 100 mL/hr at 09/28/22 1054, 20 mEq at 09/28/22 1054  potassium chloride in sterile water 20 mEq/50 mL IVPB 20 mEq, 20 mEq, Intravenous, Q1H PRN, Magdalene River, MD  prochlorperazine edisylate (COMPAZINE) injection 10 mg, 10 mg, IV Push, Q6H PRN, Chrystine Oiler, MD, 10 mg at 09/25/22 1056  sodium chloride 0.9 % (new bag) bolus 500 mL, 500 mL, Intravenous, Once PRN, Chrystine Oiler, MD  sodium chloride 0.9 % flush 10 mL, 10 mL, IV Push, PRN for Line Care, Chrystine Oiler, MD, 10 mL at 09/27/22 1239  sodium chloride 0.9 % flush 3 mL, 3 mL, IV Push, Q8H, Al-Musa, Babetta Paterson, MD, 3 mL at 09/29/22 0641  sodium chloride 0.9 % flush 3 mL, 3 mL, IV Push, PRN for Line Care, Roetta Sessions, MD  sodium chloride 0.9% infusion, 5 mL/hr, Intravenous, PRN, Chrystine Oiler, MD  tacrolimus (PROGRAF) immediate release capsule 3 mg, 3 mg, Oral, Q12H (0600-1800), Chrystine Oiler, MD, 3 mg at 09/29/22 0640  ursodioL (URSO 250) tablet 500 mg, 500 mg, Oral, BID, Chrystine Oiler, MD, 500 mg at 09/28/22 2055Objective: Temp:  [97.6 ?F (36.4 ?C)-98.7 ?F (37.1 ?C)] 98.5 ?F (36.9 ?C)Pulse:  [55-71] 65Resp:  [18-20] 19BP: (112-135)/(52-78) 135/78SpO2:  [94 %-98 %] 97 %I/O's:Gross Totals (Last 24 hours) at 09/29/2022 0820Last data filed at 09/29/2022 0530Intake 1880 ml Output 5170 ml Net -3290 ml Wt: 09/29/22 108 kg 09/16/22 104.2 kg 09/13/22 104.3 kg 09/11/22 105.9 kg 09/09/22 105.4 kg 09/04/22 106.4 kg Physical ExamConstitutional:     Appearance: Normal appearance. He is normal weight. He is not ill-appearing or diaphoretic. HENT:    Head: Normocephalic.    Nose: Nose normal.    Mouth/Throat:    Mouth: Mucous membranes are moist.    Pharynx: Oropharynx is clear. No oropharyngeal exudate. Eyes:    General: No scleral icterus.      Right eye: No discharge.       Left eye: No discharge. Cardiovascular:    Rate and Rhythm: Normal rate and regular rhythm.    Heart sounds: Normal heart sounds. No murmur heard.Pulmonary:    Effort: Pulmonary effort is normal. No respiratory distress.    Breath sounds: Normal breath sounds. No wheezing or rales. Abdominal:    General: Abdomen is flat. Bowel sounds are normal. There is no distension.    Palpations: Abdomen is soft.    Tenderness: There is no abdominal tenderness. There is no guarding. Musculoskeletal:       General: No swelling or deformity. Normal range of motion.    Right lower leg: No edema.    Left lower leg: No edema. Skin:  General: Skin is warm.    Findings: Rash (left eyelid swollen with ?purpura, erythema over forehead less pronounced compared to yesterday) present.    Comments: Toes hyperpigmetned/?PVD - not newBruises, petechiae scattered Neurological:    Mental Status: He is alert and oriented to person, place, and time. Psychiatric:       Mood and Affect: Mood normal. Labs:Recent Results (from the past 24 hour(s)) Sodium  Collection Time: 09/28/22 11:34 PM Result Value Ref Range  Sodium 136 136 - 144 mmol/L Magnesium  Collection Time: 09/29/22  4:55 AM Result Value Ref Range  Magnesium 2.2 1.7 - 2.4 mg/dL NT-proBrain natriuretic peptide  Collection Time: 09/29/22  4:55 AM Result Value Ref Range  NT-proBNP 2,650.0 (H) <125.0 pg/mL CBC auto differential  Collection Time: 09/29/22  4:55 AM Result Value Ref Range  WBC <0.1 (L) 4.0 - 11.0 x1000/?L  RBC 2.66 (L) 4.00 - 6.00 M/?L  Hemoglobin 7.6 (L) 13.2 - 17.1 g/dL  Hematocrit 16.10 (L) 96.04 - 50.00 %  MCV 83.5 80.0 - 100.0 fL  MCH 28.6 27.0 - 33.0 pg  MCHC 34.2 31.0 - 36.0 g/dL  RDW-CV 54.0 (H) 98.1 - 15.0 %  Platelets 12 (L) 150 - 420 x1000/?L  MPV    Neutrophils    Lymphocytes    Monocytes    Eosinophils    Basophil    Immature Granulocytes    nRBC 0.0 0.0 - 1.0 %  Absolute Lymphocyte Count    Monocyte Absolute Count    Eosinophil Absolute Count    Basophil Absolute Count    Absolute Immature Granulocyte Count    Absolute nRBC 0.00 0.00 - 1.00 x 1000/?L  ANC (Abs Neutrophil Count) 0.01 (L) 2.00 - 7.60 x 1000/?L Comprehensive metabolic panel  Collection Time: 09/29/22  4:55 AM Result Value Ref Range  Sodium 139 136 - 144 mmol/L  Potassium 3.6 3.3 - 5.3 mmol/L  Chloride 107 98 - 107 mmol/L  CO2 20 20 - 30 mmol/L  Anion Gap 12 7 - 17  Glucose 109 (H) 70 - 100 mg/dL  BUN 5 (L) 8 - 23 mg/dL  Creatinine 1.91 4.78 - 1.30 mg/dL  Calcium 8.0 (L) 8.8 - 10.2 mg/dL  BUN/Creatinine Ratio 5.2 (L) 8.0 - 23.0  Total Protein 6.3 5.9 - 8.3 g/dL  Albumin 3.1 (L) 3.6 - 5.1 g/dL  Total Bilirubin 0.6 <=2.9 mg/dL  Alkaline Phosphatase 83 9 - 122 U/L  Alanine Aminotransferase (ALT) 12 9 - 59 U/L  Aspartate Aminotransferase (AST) 20 10 - 35 U/L  Globulin 3.2 2.0 - 3.9 g/dL  A/G Ratio 1.0 1.0 - 2.2  AST/ALT Ratio 1.7 Reference Range Not Established  eGFR (Creatinine) >60 >=60 mL/min/1.82m2 Immature Platelet Fraction (BH GH LMW YH)  Collection Time: 09/29/22  4:55 AM Result Value Ref Range  Immature Platelet Fraction 0.8 (L) 1.2 - 8.6 %  Absolute Immature Platelet Fraction 0.1 <20.0 x1000/?L Urinalysis-macroscopic w/reflex microscopic  Collection Time: 09/29/22  5:40 AM Result Value Ref Range  Clarity, UA Clear Clear  Color, UA Colorless Yellow, Colorless  Specific Gravity, UA 1.011 1.005 - 1.030  pH, UA 6.0 5.5 - 7.5  Protein, UA Negative Negative, Trace  Glucose, UA Negative Negative  Ketones, UA 2+ (A) Negative  Blood, UA Trace (A) Negative  Bilirubin, UA Negative Negative  Leukocytes, UA Negative Negative  Nitrite, UA Negative Negative  Urobilinogen, UA <2.0 <=2.0 mg/dL Urine microscopic     (BH GH LMW YH)  Collection Time: 09/29/22  5:40 AM Result Value  Ref Range  RBC/HPF, UA <1 0 - 2 /HPF  WBC/HPF, UA <1 0 - 5 /HPF Diagnostics:No results found.Assessment: Douglas [name] is a 72 y.o. 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 He is s/p 2 cycles of chemotherapy with Decitabine + Venetoclax with reduction in blast count. 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  Plan:  Day +5:  Conditioning/Transplant: (completed)-Melphalan 70 mg/m2 over 30 minutes on D-6, *Melphalan dosed reduced to 70 mg/m2 due to age-Fludarabine 40 mg/m2 over 30 minutes every 24hr for 4 doses from D-5 to D -2             -PT cyclophosphamide 25 mg/kg IV over 1.5 hrs every 24hr for 2 doses on D +3 & D +4 (reduced by 50% to 25 mg/kg due 10/10 match and age)-Mesna 50 mg/kg CIVI daily, for 2 doses, starting 15 minutes before cyclophosphamide on D +3 & D +4-Stem cell infusion day 0 (09/24/22) HEME: - Transfuse pRBCs to maintain Hb >7 g/dL, platelets >54 or if clinically indicated- Filgrastim 25mcg/kg starting on day +5 until stable engraftment of neutrophils.ID: Prophylactic antimicrobials: Acyclovir 400 mg PO TID (home medications),  Zosyn, Isavuconazole 372 mg po daily (prior authorization pending, voriconazole allergy-gingival hyperplasia), Pentamidine 300 mg inhalation monthly- CMV -/pending, attempt to get letermovir for CMV prophylaxis. Will send script on Day +7 to determine insurance coverage before starting inpatient.Check CMV PCR weekly.- If febrile, panculture, UA and CXR, start broad spectrum empiric antibiotics if neutropenic or septic appearing.   - Repeat blood cx every 24 hours with temperature > 100.4 or septic appearing GVHD Prophylaxis:  - Tacrolimus  3mg  PO starting day +5.  Goal of 6-11ng/ml- PT cyclophosphamide 25 mg/kg IV over 1.5 hrs every 24hr for 2 doses on D +3 & D +4 with mesna daily on Day +3 and Day +4  (PTCy dose reduced by 50% )- Monitor levels, haptoglobin, LDH, triglycerides.- Daily skin exam, liver function tests twice weekly (minimum), document stool output/day. Fluids, Electrolytes and Nutrition:- Regular diet, consider nutrition consult if needed- Daily weights- Strict Intake and output- Replete electrolytes PRNEmesis prophylaxis: Ondansetron 16 mg PO every 12 hours w/ PRN Compazine, Dexamethasone 12mg  po premed prior to melphalan High dose cyclophosphamide: doxepin premed 25 mg and diphenhydramine 25 mg 30 mins before Cytoxan; D5W 1/2NS + KCL 20 meq + Magnesium 1 gm alternating with D5W + of NaCHO3 + KCL 20 meq until 24 hours after last Cyclophosphamide dose (hold fluid during Cytoxan infusion)Diarrhea: imodium 2mg  PRN 4 times daily, lomotil 2.5-0.025 4x daily PRN  VOD: - Continue Ursodiol 500mg  PO BID, continue until Day +90- Daily weights, strict fluid monitoring of all intake/output- Monitor liver function tests and coags at least twice weekly HTNPatient reports not taking his antiHTN meds for the last few days due to feeling weak. - Will hold home antihypertensive meds for now - monitor BP and reorder as appropriate Rash1) Maculopapular on back and abdomen, first noted 8/10, appears to be worsening - bx sent by derm today due to concern for leukocytoclastic vasculitis2) Forehead erythema and left eyelid erythema and swelling with purpura on eyelid - improving so will hold off on bx per derm, felt to be hordeolum, however, given pt counts will continue empiric tx for cellulitis- F/u bx results- warm compresses PRN for left eye + will start erythromycin ointment for possible rosacea  CVAD: Hickman for chemotherapy and supportive care Discharge Planning: Pending count recovery 24 hour caregiver: Signed:Jocee Kissick Al-Musa, MDPGY-2, Internal Medicine8/13/2024

## 2022-09-30 ENCOUNTER — Ambulatory Visit: Admit: 2022-09-30 | Payer: MEDICARE

## 2022-09-30 LAB — CBC WITH AUTO DIFFERENTIAL
BKR WAM ABSOLUTE NRBC (2 DEC): 0 x 1000/ÂµL (ref 0.00–1.00)
BKR WAM HEMATOCRIT (2 DEC): 20.4 % — ABNORMAL LOW (ref 38.50–50.00)
BKR WAM HEMOGLOBIN: 7.1 g/dL — ABNORMAL LOW (ref 13.2–17.1)
BKR WAM MCH (PG): 29 pg (ref 27.0–33.0)
BKR WAM MCHC: 34.8 g/dL (ref 31.0–36.0)
BKR WAM MCV: 83.3 fL (ref 80.0–100.0)
BKR WAM NUCLEATED RED BLOOD CELLS: 0 % (ref 0.0–1.0)
BKR WAM PLATELETS: 11 x1000/ÂµL — ABNORMAL LOW (ref 150–420)
BKR WAM RDW-CV: 14.9 % (ref 11.0–15.0)
BKR WAM RED BLOOD CELL COUNT.: 2.45 M/ÂµL — ABNORMAL LOW (ref 4.00–6.00)
BKR WAM WHITE BLOOD CELL COUNT: <0.1 x1000/ÂµL — ABNORMAL LOW (ref 4.0–11.0)

## 2022-09-30 LAB — COMPREHENSIVE METABOLIC PANEL
BKR A/G RATIO: 0.9 — ABNORMAL LOW (ref 1.0–2.2)
BKR ALANINE AMINOTRANSFERASE (ALT): 13 U/L (ref 9–59)
BKR ALBUMIN: 2.9 g/dL — ABNORMAL LOW (ref 3.6–5.1)
BKR ALKALINE PHOSPHATASE: 78 U/L (ref 9–122)
BKR ANION GAP: 10 (ref 7–17)
BKR ASPARTATE AMINOTRANSFERASE (AST): 21 U/L (ref 10–35)
BKR AST/ALT RATIO: 1.6
BKR BILIRUBIN TOTAL: 0.7 mg/dL (ref <=1.2)
BKR BLOOD UREA NITROGEN: 6 mg/dL — ABNORMAL LOW (ref 8–23)
BKR BUN / CREAT RATIO: 7.7 — ABNORMAL LOW (ref 8.0–23.0)
BKR CALCIUM: 8.2 mg/dL — ABNORMAL LOW (ref 8.8–10.2)
BKR CHLORIDE: 108 mmol/L — ABNORMAL HIGH (ref 98–107)
BKR CO2: 21 mmol/L (ref 20–30)
BKR CREATININE: 0.78 mg/dL (ref 0.40–1.30)
BKR EGFR, CREATININE (CKD-EPI 2021): >60 mL/min/{1.73_m2} (ref >=60)
BKR GLOBULIN: 3.2 g/dL (ref 2.0–3.9)
BKR GLUCOSE: 85 mg/dL (ref 70–100)
BKR POTASSIUM: 3.7 mmol/L (ref 3.3–5.3)
BKR PROTEIN TOTAL: 6.1 g/dL (ref 5.9–8.3)
BKR SODIUM: 139 mmol/L (ref 136–144)

## 2022-09-30 LAB — BLOOD BANK CULTURES (LAB ORDERABLE ONLY)     (YH)
BKR BLOOD BANK CULTURE: NO GROWTH
BKR BLOOD BANK CULTURE: NO GROWTH

## 2022-09-30 LAB — MAGNESIUM: BKR MAGNESIUM: 2.2 mg/dL (ref 1.7–2.4)

## 2022-09-30 LAB — TACROLIMUS LEVEL     (BH GH L LMW YH): BKR TACROLIMUS BLOOD: 0.9 ng/mL — ABNORMAL LOW

## 2022-09-30 LAB — IMMATURE PLATELET FRACTION (BH GH LMW YH)
BKR WAM IPF, ABSOLUTE: 0.1 x1000/ÂµL (ref <20.0)
BKR WAM IPF: 1.6 % (ref 1.2–8.6)

## 2022-09-30 MED ORDER — TACROLIMUS IMMEDIATE RELEASE 1 MG CAPSULE
1 | Freq: Once | ORAL | Status: CP
Start: 2022-09-30 — End: ?
  Administered 2022-09-30: 18:00:00 1 mg via ORAL

## 2022-09-30 MED ORDER — CLINDAMYCIN PHOSPHATE 1 % TOPICAL SOLUTION
1 | Freq: Two times a day (BID) | TOPICAL | Status: CP
Start: 2022-09-30 — End: ?
  Administered 2022-10-01 – 2022-10-09 (×14): 1 mL via TOPICAL

## 2022-09-30 MED ORDER — TACROLIMUS IMMEDIATE RELEASE 1 MG CAPSULE
1 | Freq: Two times a day (BID) | ORAL | Status: DC
Start: 2022-09-30 — End: 2022-10-01
  Administered 2022-09-30 – 2022-10-01 (×2): 1 mg via ORAL

## 2022-09-30 MED ORDER — RIZATRIPTAN 5 MG DISINTEGRATING TABLET
5 | Freq: Once | Status: DC
Start: 2022-09-30 — End: 2022-09-30

## 2022-09-30 NOTE — Plan of Care
Plan of Care Overview/ Patient Status   Received pt this morning in bed resting with supportive wife at bedside. He is alert and oriented x4 and reports feeling much better today.  He remains stable on day +6 s/p allogenic stem cell transplant for AML and awaiting count recovery. He is afebrile and continues on neupogen injections and also on antimicrobials per orders. He is also on gvhd prophylaxis with tacrolimus with level of 0.9 today so given additional 1mg  dose and dose increased to 4mg . He was transfused with one bag of platelets today for count of 11k which he tolerated well with no reactions and no pre meds given. His po intake remains fair and reports diarrhea has improved and po fluids encouraged. He is independent to toilet but ambulated in hall with standby assistance with walker this afternoon and maintained on safety precautions. Will continue to monitor closely. See flow sheet for detail assessment.

## 2022-09-30 NOTE — Plan of Care
Plan of Care Overview/ Patient Status    Problem: Adult Inpatient Plan of CareGoal: Plan of Care Review8/14/2024 0100 by Brittanyann Wittner, Mitzi Davenport, RNOutcome: Interventions implemented as appropriate8/14/2024 0100 by Domonic Kimball, Mitzi Davenport, RNOutcome: Interventions implemented as appropriate 1900-0730:Day +6 MUDPatient A&Ox4, VSS on RA. Independent OOB w/ a steady gait.  Voids spontaneously, up to toilet. LBM 8/13.  No N/V this shift.  Diarrhea endorsed, PRN imodium administered per mar. No complaints of pain this shift.  Skin intact. Medications taken whole with water. RDL Hickman, dressing clean, dry, and intact, flushes well without difficulty, blood return present. No repletion's this shift.  No blood products administered this shift. Continues on IV ABX.Safety/Fall precautions maintained this shift. Call bell and personal items within reach. Safety and hourly rounds completed throughout shift. Care plan maintained throughout shift. Please see flow sheets for further details. Mitzi Davenport Jeriyah Granlund, RN 09/30/2022

## 2022-09-30 NOTE — Progress Notes
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 Some diarrhea and nausea on occasion, but seems better today. Area of eye less swollen and he believes overall improved. He is using the ointment with improvement. He has some fatigueEating most of his meals. Review of Systems Constitutional:  Positive for malaise/fatigue. Negative for chills and fever. HENT: Negative.  Negative for sore throat.  Eyes: Negative.  Respiratory: Negative.  Negative for cough, sputum production, shortness of breath and wheezing.  Cardiovascular: Negative.  Negative for chest pain, palpitations and leg swelling. Gastrointestinal:  Positive for diarrhea and nausea. Negative for abdominal pain, constipation and vomiting. Genitourinary:  Negative for dysuria, frequency and urgency. Musculoskeletal: Negative.  Skin:  Positive for rash. Negative for itching.      Toes discolored - not new Neurological: Negative.  Psychiatric/Behavioral: Negative.   Review of Allergies/Meds/Hx: Allergies Allergen Reactions  Voriconazole Other (See Comments)   Gingival edema/redness making it difficult to bite down  Medications:Current Facility-Administered Medications:   acetaminophen (TYLENOL) tablet 650 mg, 650 mg, Oral, Q6H PRN, Shen, Meifeng, PA  acyclovir (ZOVIRAX) capsule 400 mg, 400 mg, Oral, Q8H, Chrystine Oiler, MD, 400 mg at 09/30/22 0531  albuterol neb sol 2.5 mg/3 mL (0.083%) (PROVENTIL,VENTOLIN), 2.5 mg, Nebulization, Q30 MIN PRN, Chrystine Oiler, MD  chlorhexidine gluconate (PERIDEX) 0.12 % solution 15 mL, 15 mL, Mouth/Throat, BID, Al-Musa, Amer, MD, 15 mL at 09/30/22 0939  clindamycin (CLEOCIN T) 1 % external solution, , Topical (Top), BID, Jaston Havens A, PA  diphenhydrAMINE (BENADRYL) injection 50 mg, 50 mg, IV Push, Once PRN, Chrystine Oiler, MD  diphenoxylate-atropine (LOMOTIL) 2.5-0.025 mg per tablet 1 tablet, 1 tablet, Oral, 4x DAILY PRN, Roetta Sessions, MD, 1 tablet at 09/29/22 1044  EPINEPHrine (EPI-PEN) auto-injector 0.3 mg, 0.3 mg, Intramuscular, Q15 MIN PRN, Chrystine Oiler, MD  EPINEPHrine (EPI-PEN) auto-injector 0.3 mg, 0.3 mg, Intramuscular, Q15 MIN PRN, Chrystine Oiler, MD  erythromycin (ROMYCIN) 5 mg/gram (0.5 %) ophthalmic ointment, , Left Eye, Nightly, Al-Musa, Denyse Dago, MD, Given at 09/29/22 1205  famotidine (PF) (PEPCID) 20 mg in sodium chloride 0.9% PF 5 mL Injection, 20 mg, IV Push, Once PRN, Chrystine Oiler, MD  filgrastim-sndz (ZARXIO) syringe 480 mcg, 5 mcg/kg (Treatment Plan Recorded), Subcutaneous, Daily (1700), Chrystine Oiler, MD, 480 mcg at 09/29/22 1810  finasteride (PROSCAR) tablet 5 mg, 5 mg, Oral, Daily, Al-Musa, Amer, MD, 5 mg at 09/30/22 0940  furosemide (LASIX) injection 20 mg, 20 mg, Intravenous, Once, Al-Musa, Denyse Dago, MD  hydrocortisone sodium succinate (PF) (Solu-CORTEF) injection 100 mg, 100 mg, IV Push, Once PRN, Chrystine Oiler, MD  isavuconazonium (Cresemba) capsule Cap 372 mg, 372 mg, Oral, Daily, Chrystine Oiler, MD, 372 mg at 09/30/22 0940  latanoprost (XALATAN) 0.005 % ophthalmic solution 1 drop, 1 drop, Both Eyes, Nightly, Al-Musa, Amer, MD, 1 drop at 09/27/22 2119  loperamide (IMODIUM) capsule 2 mg, 2 mg, Oral, 4x DAILY PRN, Roetta Sessions, MD, 2 mg at 09/29/22 2151  LORazepam (ATIVAN) tablet 0.5 mg, 0.5 mg, Oral,  Q6H PRN, Chrystine Oiler, MD  normal saline 0.9 % oral rinse 30 mL, 30 mL, Oral, 4x DAILY PRN, William Hamburger, MD  ondansetron (PF) [facility]) injection 8 mg, 8 mg, IV Push, Q12H, Al-Musa, Amer, MD, 8 mg at 09/30/22 0530  piperacillin-tazobactam (ZOSYN) 4.5 g in sodium chloride 0.9% 100 mL (mini-bag plus), 4.5 g, Intravenous, Q6H, William Hamburger, MD, Last Rate: 33.3 mL/hr at 09/30/22 0938, 4.5 g at 09/30/22 0938  potassium chloride in sterile water 20 mEq/100 mL IVPB 20 mEq, 20 mEq, Intravenous, Q1H PRN, Magdalene River, MD, Last Rate: 100 mL/hr at 09/30/22 0915, 20 mEq at 09/30/22 0915  potassium chloride in sterile water 20 mEq/50 mL IVPB 20 mEq, 20 mEq, Intravenous, Q1H PRN, Magdalene River, MD  prochlorperazine edisylate (COMPAZINE) injection 10 mg, 10 mg, IV Push, Q6H PRN, Chrystine Oiler, MD, 10 mg at 09/25/22 1056  sodium chloride 0.9 % (new bag) bolus 500 mL, 500 mL, Intravenous, Once PRN, Chrystine Oiler, MD  sodium chloride 0.9 % flush 10 mL, 10 mL, IV Push, PRN for [facility], Chrystine Oiler, MD, 20 mL at 09/30/22 0939  sodium chloride 0.9 % flush 3 mL, 3 mL, IV Push, Q8H, Al-Musa, Amer, MD, 3 mL at 09/29/22 1629  sodium chloride 0.9 % flush 3 mL, 3 mL, IV Push, PRN for Line Care, Roetta Sessions, MD  sodium chloride 0.9% infusion, 5 mL/hr, Intravenous, PRN, Chrystine Oiler, MD  tacrolimus (PROGRAF) immediate release capsule 1 mg, 1 mg, Oral, Once, Rebekha Diveley A, PA  tacrolimus (PROGRAF) immediate release capsule 4 mg, 4 mg, Oral, Q12H (0600-1800), Chantelle Verdi A, PA  ursodioL (URSO 250) tablet 500 mg, 500 mg, Oral, BID, Chrystine Oiler, MD, 500 mg at 09/30/22 0940Objective: Temp:  [97.3 ?F (36.3 ?C)-98.6 ?F (37 ?C)] 98.6 ?F (37 ?C)Pulse:  [56-67] 58Resp:  [18-20] 19BP: (133-159)/(69-77) 147/76SpO2:  [97 %-99 %] 98 %I/O's:Gross Totals (Last 24 hours) at 09/30/2022 1140Last data filed at 09/30/2022 0915Intake 240 ml Output 1700 ml Net -1460 ml Wt: 09/30/22 107.3 kg 09/16/22 104.2 kg 09/13/22 104.3 kg 09/11/22 105.9 kg 09/09/22 105.4 kg 09/04/22 106.4 kg Physical ExamConstitutional:     Appearance: Normal appearance. He is normal weight. He is not ill-appearing or diaphoretic. HENT:    Head: Normocephalic.    Nose: Nose normal.    Mouth/Throat:    Mouth: Mucous membranes are moist.    Pharynx: Oropharynx is clear. No oropharyngeal exudate. Eyes:    General: No scleral icterus.      Right eye: No discharge.       Left eye: No discharge. Cardiovascular:    Rate and Rhythm: Normal rate and regular rhythm.    Heart sounds: Normal heart sounds. No murmur heard.Pulmonary:    Effort: Pulmonary effort is normal. No respiratory distress.    Breath sounds: Normal breath sounds. No wheezing or rales. Abdominal:    General: Abdomen is flat. Bowel sounds are normal. There is no distension.    Palpations: Abdomen is soft.    Tenderness: There is no abdominal tenderness. There is no guarding. Musculoskeletal:       General: No swelling or deformity. Normal range of motion.    Right lower leg: No edema.    Left lower leg: No edema. Skin:   General: Skin is warm.    Findings: Rash (left eyelid swollen with ?purpura, erythema over forehead less pronounced compared to yesterday) present.    Comments: Toes hyperpigmetned/?PVD - not newBruises, petechiae scattered Neurological:    Mental Status: He is alert  and oriented to person, place, and time. Psychiatric:       Mood and Affect: Mood normal. Labs:Recent Results (from the past 24 hour(s)) Tacrolimus level  Collection Time: 09/30/22  5:29 AM Result Value Ref Range  Tacrolimus Lvl 0.9 (L) See Comment ng/mL Magnesium  Collection Time: 09/30/22  5:29 AM Result Value Ref Range  Magnesium 2.2 1.7 - 2.4 mg/dL CBC auto differential  Collection Time: 09/30/22  5:29 AM Result Value Ref Range  WBC <0.1 (L) 4.0 - 11.0 x1000/?L  RBC 2.45 (L) 4.00 - 6.00 M/?L  Hemoglobin 7.1 (L) 13.2 - 17.1 g/dL  Hematocrit 78.29 (L) 56.21 - 50.00 %  MCV 83.3 80.0 - 100.0 fL  MCH 29.0 27.0 - 33.0 pg  MCHC 34.8 31.0 - 36.0 g/dL  RDW-CV 30.8 65.7 - 84.6 %  Platelets 11 (L) 150 - 420 x1000/?L  MPV    nRBC 0.0 0.0 - 1.0 %  Absolute nRBC 0.00 0.00 - 1.00 x 1000/?L  ANC (Abs Neutrophil Count)   Comprehensive metabolic panel  Collection Time: 09/30/22  5:29 AM Result Value Ref Range  Sodium 139 136 - 144 mmol/L  Potassium 3.7 3.3 - 5.3 mmol/L  Chloride 108 (H) 98 - 107 mmol/L  CO2 21 20 - 30 mmol/L  Anion Gap 10 7 - 17  Glucose 85 70 - 100 mg/dL  BUN 6 (L) 8 - 23 mg/dL  Creatinine 9.62 9.52 - 1.30 mg/dL  Calcium 8.2 (L) 8.8 - 10.2 mg/dL  BUN/Creatinine Ratio 7.7 (L) 8.0 - 23.0  Total Protein 6.1 5.9 - 8.3 g/dL  Albumin 2.9 (L) 3.6 - 5.1 g/dL  Total Bilirubin 0.7 <=8.4 mg/dL  Alkaline Phosphatase 78 9 - 122 U/L  Alanine Aminotransferase (ALT) 13 9 - 59 U/L  Aspartate Aminotransferase (AST) 21 10 - 35 U/L  Globulin 3.2 2.0 - 3.9 g/dL  A/G Ratio 0.9 (L) 1.0 - 2.2  AST/ALT Ratio 1.6 Reference Range Not Established  eGFR (Creatinine) >60 >=60 mL/min/1.38m2 Immature Platelet Fraction (BH GH LMW YH)  Collection Time: 09/30/22  5:29 AM Result Value Ref Range  Immature Platelet Fraction 1.6 1.2 - 8.6 %  Absolute Immature Platelet Fraction 0.1 <20.0 x1000/?L Diagnostics:No results found.Assessment: Douglas [name] is a 72 y.o. 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 He is s/p 2 cycles of chemotherapy with Decitabine + Venetoclax with reduction in blast count. 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  Plan:  Day +6: Continue supportive care. Topicals including clindaymycin and erythromycin for skin folliculitis and eye swelling/erythema. 1 unit plts. Increase tacrolimus as level only .9 on first draw.  Conditioning/Transplant: (completed)-Melphalan 70 mg/m2 over 30 minutes on D-6, *Melphalan dosed reduced to 70 mg/m2 due to age-Fludarabine 40 mg/m2 over 30 minutes every 24hr for 4 doses from D-5 to D -2             -PT cyclophosphamide 25 mg/kg IV over 1.5 hrs every 24hr for 2 doses on D +3 & D +4 (reduced by 50% to 25 mg/kg due 10/10 match and age)-Mesna 50 mg/kg CIVI daily, for 2 doses, starting 15 minutes before cyclophosphamide on D +3 & D +4-Stem cell infusion day 0 (09/24/22) HEME: 1 unit plts. - Transfuse pRBCs to maintain Hb >7 g/dL, platelets >13 or if clinically indicated- Filgrastim 40mcg/kg starting on day +5 until stable engraftment of neutrophils.ID: Prophylactic  antimicrobials: Acyclovir 400 mg PO TID (home medications),  Zosyn, Isavuconazole 372 mg po daily (prior authorization pending, voriconazole allergy-gingival hyperplasia), Pentamidine 300 mg inhalation monthly- CMV -/pending, attempt to get letermovir for CMV prophylaxis. Will send script on Day +7 to determine insurance coverage before starting inpatient.Check CMV PCR weekly.- If febrile, panculture, UA and CXR, start broad spectrum empiric antibiotics if neutropenic or septic appearing.   - Repeat blood cx every 24 hours with temperature > 100.4 or septic appearing GVHD Prophylaxis:  - Tacrolimus  3mg  PO starting day +5.  Increased dose given level only .9 on 1st draw on 8/14. Goal of 6-11ng/ml- PT cyclophosphamide 25 mg/kg IV over 1.5 hrs every 24hr for 2 doses on D +3 & D +4 with mesna daily on Day +3 and Day +4  (PTCy dose reduced by 50% )- Monitor levels, haptoglobin, LDH, triglycerides.- Daily skin exam, liver function tests twice weekly (minimum), document stool output/day. Fluids, Electrolytes and Nutrition:- Regular diet, consider nutrition consult if needed- Daily weights- Strict Intake and output- Replete electrolytes PRNEmesis prophylaxis: Ondansetron 16 mg PO every 12 hours w/ PRN Compazine, Dexamethasone 12mg  po premed prior to melphalan High dose cyclophosphamide: doxepin premed 25 mg and diphenhydramine 25 mg 30 mins before Cytoxan; D5W 1/2NS + KCL 20 meq + Magnesium 1 gm alternating with D5W + of NaCHO3 + KCL 20 meq until 24 hours after last Cyclophosphamide dose (hold fluid during Cytoxan infusion)Diarrhea: imodium 2mg  PRN 4 times daily, lomotil 2.5-0.025 4x daily PRN  VOD: - Continue Ursodiol 500mg  PO BID, continue until Day +90- Daily weights, strict fluid monitoring of all intake/output- Monitor liver function tests and coags at least twice weekly HTNPatient reports not taking his antiHTN meds for the last few days due to feeling weak. 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 2) Forehead erythema and left eyelid erythema and swelling with purpura on eyelid - improving so will hold off on bx per derm, felt to be hordeolum, however, given pt counts will continue empiric tx for cellulitis- F/u bx results- warm compresses PRN for left eye + will start erythromycin ointment for possible rosacea  CVAD: Hickman for chemotherapy and supportive care Discharge Planning: Pending count recovery 24 hour caregiver: Signed:Shequila Neglia Fredderick Severance PA-CMHB

## 2022-09-30 NOTE — Progress Notes
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 Negative for chills and fever. HENT: Negative.  Negative for sore throat.  Eyes: Negative.  Respiratory: Negative.  Negative for cough, sputum production, shortness of breath and wheezing.  Cardiovascular: Negative.  Negative for chest pain, palpitations and leg swelling. Gastrointestinal:  Positive for diarrhea and nausea. Negative for abdominal pain, constipation and vomiting. Genitourinary:  Negative for dysuria, frequency and urgency. Musculoskeletal: Negative.  Skin:  Positive for rash. Negative for itching.      Toes discolored - not new Neurological: Negative.  Psychiatric/Behavioral: Negative.   Review of Allergies/Meds/Hx: Allergies Allergen Reactions  Voriconazole Other (See Comments)   Gingival edema/redness making it difficult to bite down  Medications:Current Facility-Administered Medications:   acetaminophen (TYLENOL) tablet 650 mg, 650 mg, Oral, Q6H PRN, Shen, Meifeng, PA  acyclovir (ZOVIRAX) capsule 400 mg, 400 mg, Oral, Q8H, Chrystine Oiler, MD, 400 mg at 09/30/22 0531  albuterol neb sol 2.5 mg/3 mL (0.083%) (PROVENTIL,VENTOLIN), 2.5 mg, Nebulization, Q30 MIN PRN, Chrystine Oiler, MD  chlorhexidine gluconate (PERIDEX) 0.12 % solution 15 mL, 15 mL, Mouth/Throat, BID, Al-Musa, Amer, MD, 15 mL at 09/29/22 0941 diphenhydrAMINE (BENADRYL) injection 50 mg, 50 mg, IV Push, Once PRN, Chrystine Oiler, MD  diphenoxylate-atropine (LOMOTIL) 2.5-0.025 mg per tablet 1 tablet, 1 tablet, Oral, 4x DAILY PRN, Roetta Sessions, MD, 1 tablet at 09/29/22 1044  EPINEPHrine (EPI-PEN) auto-injector 0.3 mg, 0.3 mg, Intramuscular, Q15 MIN PRN, Chrystine Oiler, MD  EPINEPHrine (EPI-PEN) auto-injector 0.3 mg, 0.3 mg, Intramuscular, Q15 MIN PRN, Chrystine Oiler, MD  erythromycin (ROMYCIN) 5 mg/gram (0.5 %) ophthalmic ointment, , Left Eye, Nightly, Al-Musa, Denyse Dago, MD, Given at 09/29/22 1205  famotidine (PF) (PEPCID) 20 mg in sodium chloride 0.9% PF 5 mL Injection, 20 mg, IV Push, Once PRN, Chrystine Oiler, MD  filgrastim-sndz (ZARXIO) syringe 480 mcg, 5 mcg/kg (Treatment Plan Recorded), Subcutaneous, Daily (1700), Chrystine Oiler, MD, 480 mcg at 09/29/22 1810  finasteride (PROSCAR) tablet 5 mg, 5 mg, Oral, Daily, Al-Musa, Amer, MD, 5 mg at 09/29/22 0941  furosemide (LASIX) injection 20 mg, 20 mg, Intravenous, Once, Al-Musa, Denyse Dago, MD  hydrocortisone sodium succinate (PF) (Solu-CORTEF) injection 100 mg, 100 mg, IV Push, Once PRN, Chrystine Oiler, MD  isavuconazonium (Cresemba) capsule Cap 372 mg, 372 mg, Oral, Daily, Chrystine Oiler, MD, 372 mg at 09/29/22 0941  latanoprost (XALATAN) 0.005 % ophthalmic solution 1 drop, 1 drop, Both Eyes, Nightly, Al-Musa, Amer, MD, 1 drop at 09/27/22 2119  loperamide (IMODIUM) capsule 2 mg, 2 mg, Oral, 4x DAILY PRN, Roetta Sessions, MD, 2 mg at 09/29/22 2151  LORazepam (ATIVAN) tablet 0.5 mg, 0.5 mg, Oral, Q6H PRN, Chrystine Oiler, MD  normal saline 0.9 % oral rinse 30 mL, 30 mL, Oral, 4x DAILY PRN, William Hamburger, MD  ondansetron (PF) [facility]) injection 8 mg, 8 mg, IV Push, Q12H, Al-Musa, Amer, MD, 8 mg at 09/30/22 0530  piperacillin-tazobactam (ZOSYN) 4.5 g in sodium chloride 0.9% 100 mL (mini-bag plus), 4.5 g, Intravenous, Q6H, William Hamburger, MD, Last Rate: 33.3 mL/hr at 09/30/22 0305, 4.5 g at 09/30/22 0305  potassium chloride in sterile water 20 mEq/100 mL IVPB 20 mEq, 20 mEq, Intravenous, Q1H PRN, Magdalene River, MD, Last Rate: 100 mL/hr at 09/29/22 1133, 20 mEq at 09/29/22 1133  potassium chloride in sterile water 20 mEq/50 mL IVPB 20 mEq, 20 mEq, Intravenous, Q1H PRN, Magdalene River, MD  prochlorperazine edisylate (COMPAZINE) injection 10 mg, 10 mg, IV Push, Q6H PRN, Chrystine Oiler, MD, 10 mg at 09/25/22 1056  sodium chloride 0.9 % (new bag) bolus 500 mL, 500 mL, Intravenous, Once PRN, Chrystine Oiler, MD  sodium chloride 0.9 % flush 10 mL, 10 mL, IV Push, PRN for Line Care, Chrystine Oiler, MD, 10 mL at 09/27/22 1239  sodium chloride 0.9 % flush 3 mL, 3 mL, IV Push, Q8H, Al-Musa, Amer, MD, 3 mL at 09/29/22 1629  sodium chloride 0.9 % flush 3 mL, 3 mL, IV Push, PRN for Line Care, Roetta Sessions, MD  sodium chloride 0.9% infusion, 5 mL/hr, Intravenous, PRN, Chrystine Oiler, MD  tacrolimus (PROGRAF) immediate release capsule 3 mg, 3 mg, Oral, Q12H (0600-1800), Chrystine Oiler, MD, 3 mg at 09/30/22 0531  ursodioL (URSO 250) tablet 500 mg, 500 mg, Oral, BID, Chrystine Oiler, MD, 500 mg at 09/29/22 2145Objective: Temp:  [97.8 ?F (36.6 ?C)-98.7 ?F (37.1 ?C)] 98.5 ?F (36.9 ?C)Pulse:  [61-64] 61Resp:  [18-20] 20BP: (133-156)/(69-77) 133/69SpO2:  [95 %-99 %] 99 %I/O's:Gross Totals (Last 24 hours) at 09/30/2022 0620Last data filed at 09/30/2022 0307Intake -- Output 2800 ml Net -2800 ml Wt: 09/29/22 108 kg 09/16/22 104.2 kg 09/13/22 104.3 kg 09/11/22 105.9 kg 09/09/22 105.4 kg 09/04/22 106.4 kg Physical ExamConstitutional:     Appearance: Normal appearance. He is normal weight. He is not ill-appearing or diaphoretic. HENT:    Head: Normocephalic.    Nose: Nose normal.    Mouth/Throat:    Mouth: Mucous membranes are moist.    Pharynx: Oropharynx is clear. No oropharyngeal exudate. Eyes:    General: No scleral icterus.      Right eye: No discharge.       Left eye: No discharge. Cardiovascular:    Rate and Rhythm: Normal rate and regular rhythm.    Heart sounds: Normal heart sounds. No murmur heard.Pulmonary:    Effort: Pulmonary effort is normal. No respiratory distress.    Breath sounds: Normal breath sounds. No wheezing or rales. Abdominal:    General: Abdomen is flat. Bowel sounds are normal. There is no distension.    Palpations: Abdomen is soft.    Tenderness: There is no abdominal tenderness. There is no guarding. Musculoskeletal:       General: No swelling or deformity. Normal range of motion.    Right lower leg: No edema.    Left lower leg: No edema. Skin:   General: Skin is warm.    Findings: Rash (left eyelid swollen with ?purpura, erythema over forehead less pronounced compared to yesterday) present.    Comments: Toes hyperpigmetned/?PVD - not newBruises, petechiae scattered Neurological:    Mental Status: He is alert and oriented to person, place, and time. Psychiatric:       Mood and Affect: Mood normal. Labs:No results found for this or any previous visit (from the past 24 hour(s)).Diagnostics:No results found.Assessment: Douglas [name] is a Douglas y.o. 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 He is s/p 2 cycles of chemotherapy with Decitabine + Venetoclax with reduction in blast count. 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  Plan:  Day +6: Skin biopsy noted- supporative folliculitisWill trial erythromycin cream for eye lidAnticipate chemo associated pancytopeniaContinue zosynTransfuse platelets- Mild post nasal blood reportedCheck fungal markers Conditioning/Transplant: (completed)-Melphalan Douglas mg/m2 over 30 minutes on D-6, *Melphalan dosed reduced to Douglas mg/m2 due to age-Fludarabine 40 mg/m2 over 30 minutes every 24hr for 4 doses from D-5 to D -2             -PT cyclophosphamide 25 mg/kg IV over 1.5 hrs every 24hr for 2 doses on D +3 & D +4 (reduced by 50% to 25 mg/kg due 10/10 match and age)-Mesna 50 mg/kg CIVI daily, for 2 doses, starting 15 minutes before cyclophosphamide on D +3 & D +4-Stem cell infusion day 0 (09/24/22) HEME: - Transfuse pRBCs to maintain Hb >7 g/dL, platelets >09 or if clinically indicated- Filgrastim 32mcg/kg starting on day +5 until stable engraftment of neutrophils.ID: Prophylactic antimicrobials: Acyclovir 400 mg PO TID (home medications),  Zosyn, Isavuconazole 372 mg po daily (prior authorization pending, voriconazole allergy-gingival hyperplasia), Pentamidine 300 mg inhalation monthlyTopical erythromycin- CMV -/pending, attempt to get letermovir for CMV prophylaxis. Will send script on Day +7 to determine insurance coverage before starting inpatient.Check CMV PCR weekly.- If febrile, panculture, UA and CXR, start broad spectrum empiric antibiotics if neutropenic or septic appearing.   - Repeat blood cx every 24 hours with temperature > 100.4 or septic appearing GVHD Prophylaxis:  - Tacrolimus  3mg  PO starting day +5.  Goal of 6-11ng/ml- PT cyclophosphamide 25 mg/kg IV over 1.5 hrs every 24hr for 2 doses on D +3 & D +4 with mesna daily on Day +3 and Day +4  (PTCy dose reduced by 50% )- Monitor levels, haptoglobin, LDH, triglycerides.- Daily skin exam, liver function tests twice weekly (minimum), document stool output/day. Fluids, Electrolytes and Nutrition:- Regular diet, consider nutrition consult if needed- Daily weights- Strict Intake and output- Replete electrolytes PRNEmesis prophylaxis: Ondansetron 16 mg PO every 12 hours w/ PRN Compazine, Dexamethasone 12mg  po premed prior to melphalan High dose cyclophosphamide: doxepin premed 25 mg and diphenhydramine 25 mg 30 mins before Cytoxan; D5W 1/2NS + KCL 20 meq + Magnesium 1 gm alternating with D5W + of NaCHO3 + KCL 20 meq until 24 hours after last Cyclophosphamide dose (hold fluid during Cytoxan infusion)Diarrhea: imodium 2mg  PRN 4 times daily, lomotil 2.5-0.025 4x daily PRN  VOD: - Continue Ursodiol 500mg  PO BID, continue until Day +90- Daily weights, strict fluid monitoring of all intake/output- Monitor liver function tests and coags at least twice weekly HTNPatient reports not taking his antiHTN meds for the last few days due to feeling weak. 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

## 2022-10-01 LAB — CBC WITH AUTO DIFFERENTIAL
BKR WAM ABSOLUTE NRBC (2 DEC): 0 x 1000/ÂµL (ref 0.00–1.00)
BKR WAM ANC (ABSOLUTE NEUTROPHIL COUNT): 0.01 x 1000/ÂµL — ABNORMAL LOW (ref 2.00–7.60)
BKR WAM HEMATOCRIT (2 DEC): 22.8 % — ABNORMAL LOW (ref 38.50–50.00)
BKR WAM HEMOGLOBIN: 7.8 g/dL — ABNORMAL LOW (ref 13.2–17.1)
BKR WAM MCH (PG): 28.7 pg (ref 27.0–33.0)
BKR WAM MCHC: 34.2 g/dL (ref 31.0–36.0)
BKR WAM MCV: 83.8 fL (ref 80.0–100.0)
BKR WAM NUCLEATED RED BLOOD CELLS: 0 % (ref 0.0–1.0)
BKR WAM PLATELETS: 8 x1000/ÂµL — CL (ref 150–420)
BKR WAM RDW-CV: 14.7 % (ref 11.0–15.0)
BKR WAM RED BLOOD CELL COUNT.: 2.72 M/ÂµL — ABNORMAL LOW (ref 4.00–6.00)
BKR WAM WHITE BLOOD CELL COUNT: <0.1 x1000/ÂµL — ABNORMAL LOW (ref 4.0–11.0)

## 2022-10-01 LAB — COMPREHENSIVE METABOLIC PANEL
BKR A/G RATIO: 0.9 — ABNORMAL LOW (ref 1.0–2.2)
BKR ALANINE AMINOTRANSFERASE (ALT): 19 U/L (ref 9–59)
BKR ALBUMIN: 3 g/dL — ABNORMAL LOW (ref 3.6–5.1)
BKR ALKALINE PHOSPHATASE: 84 U/L (ref 9–122)
BKR ANION GAP: 10 (ref 7–17)
BKR ASPARTATE AMINOTRANSFERASE (AST): 24 U/L (ref 10–35)
BKR AST/ALT RATIO: 1.3
BKR BILIRUBIN TOTAL: 0.7 mg/dL (ref <=1.2)
BKR BLOOD UREA NITROGEN: 6 mg/dL — ABNORMAL LOW (ref 8–23)
BKR BUN / CREAT RATIO: 8.3 (ref 8.0–23.0)
BKR CALCIUM: 8.6 mg/dL — ABNORMAL LOW (ref 8.8–10.2)
BKR CHLORIDE: 108 mmol/L — ABNORMAL HIGH (ref 98–107)
BKR CO2: 21 mmol/L (ref 20–30)
BKR CREATININE: 0.72 mg/dL (ref 0.40–1.30)
BKR EGFR, CREATININE (CKD-EPI 2021): >60 mL/min/{1.73_m2} (ref >=60)
BKR GLOBULIN: 3.2 g/dL (ref 2.0–3.9)
BKR GLUCOSE: 88 mg/dL (ref 70–100)
BKR POTASSIUM: 4 mmol/L (ref 3.3–5.3)
BKR PROTEIN TOTAL: 6.2 g/dL (ref 5.9–8.3)
BKR SODIUM: 139 mmol/L (ref 136–144)

## 2022-10-01 LAB — TACROLIMUS LEVEL     (BH GH L LMW YH): BKR TACROLIMUS BLOOD: 2.2 ng/mL — ABNORMAL LOW

## 2022-10-01 LAB — IMMATURE PLATELET FRACTION (BH GH LMW YH)
BKR WAM IPF, ABSOLUTE: 0.1 x1000/ÂµL (ref <20.0)
BKR WAM IPF: 0.7 % — ABNORMAL LOW (ref 1.2–8.6)

## 2022-10-01 LAB — TRIGLYCERIDES: BKR TRIGLYCERIDES: 103 mg/dL

## 2022-10-01 LAB — MAGNESIUM: BKR MAGNESIUM: 2.1 mg/dL (ref 1.7–2.4)

## 2022-10-01 MED ORDER — MULTIVITAMIN TABLET
ORAL_TABLET | Freq: Every day | ORAL | 12 refills | 90.00 days | Status: AC
Start: 2022-10-01 — End: ?
  Filled 2022-10-10: qty 90, 90d supply, fill #0

## 2022-10-01 MED ORDER — URSODIOL 500 MG TABLET
500 mg | ORAL_TABLET | Freq: Two times a day (BID) | ORAL | 7 refills | 30.00 days | Status: AC
Start: 2022-10-01 — End: ?
  Filled 2022-10-10: qty 60, 30d supply, fill #0

## 2022-10-01 MED ORDER — SODIUM CHLORIDE 0.9 % (FLUSH) INJECTION SYRINGE
0.9 % | Freq: Every day | INTRAVENOUS | 12 refills | 30.00 days | Status: AC
Start: 2022-10-01 — End: ?
  Filled 2022-10-10: qty 600, 30d supply, fill #0

## 2022-10-01 MED ORDER — ACYCLOVIR 400 MG TABLET
400 mg | ORAL_TABLET | Freq: Two times a day (BID) | ORAL | 12 refills | 30.00 days | Status: AC
Start: 2022-10-01 — End: ?

## 2022-10-01 MED ORDER — TACROLIMUS IMMEDIATE RELEASE 5 MG CAPSULE
5 | Freq: Two times a day (BID) | ORAL | Status: DC
Start: 2022-10-01 — End: 2022-10-02
  Administered 2022-10-01 – 2022-10-02 (×2): 5 mg via ORAL

## 2022-10-01 MED ORDER — TACROLIMUS IMMEDIATE RELEASE 0.5 MG CAPSULE
0.5 mg | ORAL_CAPSULE | Freq: Two times a day (BID) | ORAL | 12 refills | 30.00 days | Status: AC
Start: 2022-10-01 — End: ?

## 2022-10-01 MED ORDER — MAGNESIUM OXIDE 400 MG (241.3 MG MAGNESIUM) TABLET
400241.3 mg (241.3 mg magnesium) | ORAL_TABLET | Freq: Two times a day (BID) | ORAL | 12 refills | 90.00 days | Status: AC
Start: 2022-10-01 — End: ?

## 2022-10-01 MED ORDER — ONDANSETRON HCL 8 MG TABLET
8 mg | ORAL_TABLET | Freq: Three times a day (TID) | ORAL | 3 refills | 7.00 days | Status: AC | PRN
Start: 2022-10-01 — End: ?
  Filled 2022-10-10: qty 20, 7d supply, fill #0

## 2022-10-01 MED ORDER — TACROLIMUS IMMEDIATE RELEASE 1 MG CAPSULE
1 mg | ORAL_CAPSULE | Freq: Two times a day (BID) | ORAL | 12 refills | 30.00 days | Status: AC
Start: 2022-10-01 — End: ?

## 2022-10-01 MED ORDER — TACROLIMUS IMMEDIATE RELEASE 1 MG CAPSULE
1 | Freq: Once | ORAL | Status: CP
Start: 2022-10-01 — End: ?
  Administered 2022-10-01: 14:00:00 1 mg via ORAL

## 2022-10-01 MED ORDER — TACROLIMUS IMMEDIATE RELEASE 5 MG CAPSULE
5 mg | ORAL_CAPSULE | Freq: Two times a day (BID) | ORAL | 12 refills | 30.00 days | Status: AC
Start: 2022-10-01 — End: ?
  Filled 2022-10-10: qty 60, 30d supply, fill #0

## 2022-10-01 NOTE — Progress Notes
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 His  forehead rash less tender today and feels it's improved. Some diarrhea and nausea on occasion, but seems better today. Area of eye less swollen and is no longer bothersome to him,He is using the ointment with improvement.  He has some fatigue, but plans to get up out of chair and move around more today. Eating most of his meals. Review of Systems Constitutional:  Positive for malaise/fatigue. Negative for chills and fever. HENT: Negative.  Negative for sore throat.  Eyes: Negative.  Respiratory: Negative.  Negative for cough, sputum production, shortness of breath and wheezing.  Cardiovascular: Negative.  Negative for chest pain, palpitations and leg swelling. Gastrointestinal:  Positive for diarrhea and nausea. Negative for abdominal pain, constipation and vomiting. Genitourinary:  Negative for dysuria, frequency and urgency. Musculoskeletal: Negative.  Skin:  Positive for rash. Negative for itching.      Toes discolored - not new Neurological: Negative.  Psychiatric/Behavioral: Negative.   Review of Allergies/Meds/Hx: Allergies Allergen Reactions  Voriconazole Other (See Comments)   Gingival edema/redness making it difficult to bite down  Medications:Current Facility-Administered Medications:   acetaminophen (TYLENOL) tablet 650 mg, 650 mg, Oral, Q6H PRN, Flonnie Hailstone, [place], PA  acyclovir (ZOVIRAX) capsule 400 mg, 400 mg, Oral, Q8H, Chrystine Oiler, MD, 400 mg at 10/01/22 1200  albuterol neb sol 2.5 mg/3 mL (0.083%) (PROVENTIL,VENTOLIN), 2.5 mg, Nebulization, Q30 MIN PRN, Chrystine Oiler, MD  chlorhexidine gluconate (PERIDEX) 0.12 % solution 15 mL, 15 mL, Mouth/Throat, BID, Al-Musa, Amer, MD, 15 mL at 10/01/22 0807  clindamycin (CLEOCIN T) 1 % external solution, , Topical (Top), BID, Glorious Flicker A, [place], Given at 10/01/22 1610  diphenhydrAMINE (BENADRYL) injection 50 mg, 50 mg, IV Push, Once PRN, Chrystine Oiler, MD  diphenoxylate-atropine (LOMOTIL) 2.5-0.025 mg per tablet 1 tablet, 1 tablet, Oral, 4x DAILY PRN, Roetta Sessions, MD, 1 tablet at 09/29/22 1044  EPINEPHrine (EPI-PEN) auto-injector 0.3 mg, 0.3 mg, Intramuscular, Q15 MIN PRN, Chrystine Oiler, MD  EPINEPHrine (EPI-PEN) auto-injector 0.3 mg, 0.3 mg, Intramuscular, Q15 MIN PRN, Chrystine Oiler, MD  erythromycin (ROMYCIN) 5 mg/gram (0.5 %) ophthalmic ointment, , Left Eye, Nightly, Roetta Sessions, MD, Given at 09/30/22 2117  famotidine (PF) (PEPCID) 20 mg in sodium chloride 0.9% PF 5 mL Injection, 20 mg, IV Push, Once PRN, Chrystine Oiler, MD  filgrastim-sndz (ZARXIO) syringe 480 mcg, 5 mcg/kg (Treatment Plan Recorded), Subcutaneous, Daily (1700), Chrystine Oiler, MD, 480 mcg at 09/30/22 1851  finasteride (PROSCAR) tablet 5 mg, 5 mg, Oral, Daily, Al-Musa, Amer, MD, 5 mg at 10/01/22 9604  furosemide (LASIX) injection 20 mg, 20 mg, Intravenous, Once, Al-Musa, Denyse Dago, MD  hydrocortisone sodium succinate (PF) (Solu-CORTEF) injection 100 mg, 100 mg, IV Push, Once PRN, Chrystine Oiler, MD  isavuconazonium (Cresemba) capsule Cap 372 mg, 372 mg, Oral, Daily, Chrystine Oiler, MD, 372 mg at 10/01/22 0807  latanoprost (XALATAN) 0.005 % ophthalmic solution 1 drop, 1 drop, Both Eyes, Nightly, Al-Musa, Amer, MD, 1 drop at 09/30/22 2128  loperamide (IMODIUM) capsule 2 mg, 2 mg, Oral, 4x DAILY PRN, Roetta Sessions, MD, 2 mg at 09/29/22 2151  LORazepam (ATIVAN) tablet 0.5 mg,  0.5 mg, Oral, Q6H PRN, Chrystine Oiler, MD  normal saline 0.9 % oral rinse 30 mL, 30 mL, Oral, 4x DAILY PRN, William Hamburger, MD  ondansetron (PF) [facility]) injection 8 mg, 8 mg, IV Push, Q12H, Al-Musa, Amer, MD, 8 mg at 10/01/22 0622  piperacillin-tazobactam (ZOSYN) 4.5 g in sodium chloride 0.9% 100 mL (mini-bag plus), 4.5 g, Intravenous, Q6H, William Hamburger, MD, Last Rate: 33.3 mL/hr at 10/01/22 1158, 4.5 g at 10/01/22 1158  potassium chloride in sterile water 20 mEq/100 mL IVPB 20 mEq, 20 mEq, Intravenous, Q1H PRN, Magdalene River, MD, Last Rate: 100 mL/hr at 09/30/22 0915, 20 mEq at 09/30/22 0915  potassium chloride in sterile water 20 mEq/50 mL IVPB 20 mEq, 20 mEq, Intravenous, Q1H PRN, Magdalene River, MD  prochlorperazine edisylate (COMPAZINE) injection 10 mg, 10 mg, IV Push, Q6H PRN, Chrystine Oiler, MD, 10 mg at 09/25/22 1056  sodium chloride 0.9 % (new bag) bolus 500 mL, 500 mL, Intravenous, Once PRN, Chrystine Oiler, MD  sodium chloride 0.9 % flush 10 mL, 10 mL, IV Push, PRN for [facility], Chrystine Oiler, MD, 20 mL at 09/30/22 0939  sodium chloride 0.9 % flush 3 mL, 3 mL, IV Push, Q8H, Al-Musa, Amer, MD, 3 mL at 09/30/22 2111  sodium chloride 0.9 % flush 3 mL, 3 mL, IV Push, PRN for [facility], Roetta Sessions, MD  sodium chloride 0.9% infusion, 5 mL/hr, Intravenous, PRN, Chrystine Oiler, MD  tacrolimus (PROGRAF) Immediate Release capsule 5 mg, 5 mg, Oral, Q12H (0600-1800), Trayden Brandy A, PA  ursodioL (URSO 250) tablet 500 mg, 500 mg, Oral, BID, Chrystine Oiler, MD, 500 mg at 10/01/22 0807Objective: Temp:  [97.3 ?F (36.3 ?C)-98.4 ?F (36.9 ?C)] 97.6 ?F (36.4 ?C)Pulse:  [50-65] 57Resp:  [16-18] 18BP: (126-169)/(71-81) 148/77SpO2:  [95 %-98 %] 98 %I/O's:Gross Totals (Last 24 hours) at 10/01/2022 1253Last data filed at 10/01/2022 1122Intake 1197 ml Output 1350 ml Net -153 ml Wt: 10/01/22 105.3 kg 09/16/22 104.2 kg 09/13/22 104.3 kg 09/11/22 105.9 kg 09/09/22 105.4 kg 09/04/22 106.4 kg Physical ExamConstitutional:     Appearance: Normal appearance. He is normal weight. He is not ill-appearing or diaphoretic. HENT:    Head: Normocephalic.    Nose: Nose normal.    Mouth/Throat:    Mouth: Mucous membranes are moist.    Pharynx: Oropharynx is clear. No oropharyngeal exudate. Eyes:    General: No scleral icterus.      Right eye: No discharge.       Left eye: No discharge. Cardiovascular:    Rate and Rhythm: Normal rate and regular rhythm.    Heart sounds: Normal heart sounds. No murmur heard.Pulmonary:    Effort: Pulmonary effort is normal. No respiratory distress.    Breath sounds: Normal breath sounds. No wheezing or rales. Abdominal:    General: Abdomen is flat. Bowel sounds are normal. There is no distension.    Palpations: Abdomen is soft.    Tenderness: There is no abdominal tenderness. There is no guarding. Musculoskeletal:       General: No swelling or deformity. Normal range of motion.    Right lower leg: No edema.    Left lower leg: No edema. Skin:   General: Skin is warm.    Findings: Rash (Mild erythema / edema of left eye lid; overall significantly improved; mild erythema over forehead) present.    Comments: Toes hyperpigmetned/?PVD - not newBruises, petechiae scattered Neurological:    Mental Status: He is alert and oriented to person, place, and time. Psychiatric:  Mood and Affect: Mood normal. Labs:Recent Results (from the past 24 hour(s)) Type and screen  Collection Time: 10/01/22  6:20 AM Result Value Ref Range  ABO Grouping A   Rh Type POS   Antibody Screen NEG   Specimen Expiration Date And Time 10/04/2022 23:59  Triglycerides  Collection Time: 10/01/22  6:20 AM Result Value Ref Range  Triglycerides 103 See Comment mg/dL Magnesium  Collection Time: 10/01/22  6:20 AM Result Value Ref Range  Magnesium 2.1 1.7 - 2.4 mg/dL Tacrolimus level  Collection Time: 10/01/22  6:20 AM Result Value Ref Range  Tacrolimus Lvl 2.2 (L) See Comment ng/mL CBC auto differential  Collection Time: 10/01/22  6:20 AM Result Value Ref Range  WBC <0.1 (L) 4.0 - 11.0 x1000/?L  RBC 2.72 (L) 4.00 - 6.00 M/?L  Hemoglobin 7.8 (L) 13.2 - 17.1 g/dL  Hematocrit 01.02 (L) 72.53 - 50.00 %  MCV 83.8 80.0 - 100.0 fL  MCH 28.7 27.0 - 33.0 pg  MCHC 34.2 31.0 - 36.0 g/dL  RDW-CV 66.4 40.3 - 47.4 %  Platelets 8 (LL) 150 - 420 x1000/?L  MPV    Neutrophils    Lymphocytes    Monocytes    Eosinophils    Basophil    Immature Granulocytes    nRBC 0.0 0.0 - 1.0 %  Absolute Lymphocyte Count    Monocyte Absolute Count    Eosinophil Absolute Count    Basophil Absolute Count    Absolute Immature Granulocyte Count    Absolute nRBC 0.00 0.00 - 1.00 x 1000/?L  ANC (Abs Neutrophil Count) 0.01 (L) 2.00 - 7.60 x 1000/?L Comprehensive metabolic panel  Collection Time: 10/01/22  6:20 AM Result Value Ref Range  Sodium 139 136 - 144 mmol/L  Potassium 4.0 3.3 - 5.3 mmol/L  Chloride 108 (H) 98 - 107 mmol/L  CO2 21 20 - 30 mmol/L  Anion Gap 10 7 - 17  Glucose 88 70 - 100 mg/dL  BUN 6 (L) 8 - 23 mg/dL  Creatinine 2.59 5.63 - 1.30 mg/dL  Calcium 8.6 (L) 8.8 - 10.2 mg/dL  BUN/Creatinine Ratio 8.3 8.0 - 23.0  Total Protein 6.2 5.9 - 8.3 g/dL  Albumin 3.0 (L) 3.6 - 5.1 g/dL  Total Bilirubin 0.7 <=8.7 mg/dL  Alkaline Phosphatase 84 9 - 122 U/L  Alanine Aminotransferase (ALT) 19 9 - 59 U/L  Aspartate Aminotransferase (AST) 24 10 - 35 U/L  Globulin 3.2 2.0 - 3.9 g/dL  A/G Ratio 0.9 (L) 1.0 - 2.2  AST/ALT Ratio 1.3 Reference Range Not Established  eGFR (Creatinine) >60 >=60 mL/min/1.69m2 Immature Platelet Fraction (BH GH LMW YH)  Collection Time: 10/01/22  6:20 AM Result Value Ref Range  Immature Platelet Fraction 0.7 (L) 1.2 - 8.6 %  Absolute Immature Platelet Fraction 0.1 <20.0 x1000/?L Diagnostics:No results found.Assessment: Douglas [name] is a 72 y.o. 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 He is s/p 2 cycles of chemotherapy with Decitabine + Venetoclax with reduction in blast count. 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  Plan:  Day +7 Continue supportive care. Topicals including clindaymycin and erythromycin for skin folliculitis and eye swelling/erythema. 1 unit plts. Consider post plt count as requiring daily plts.  Increase tacrolimus as level only 2.2, despite increase on 8/14.  Conditioning/Transplant: (completed)-Melphalan 70 mg/m2 over 30 minutes on D-6, *Melphalan dosed reduced to 70 mg/m2  due to age-Fludarabine 40 mg/m2 over 30 minutes every 24hr for 4 doses from D-5 to D -2             -PT cyclophosphamide 25 mg/kg IV over 1.5 hrs every 24hr for 2 doses on D +3 & D +4 (reduced by 50% to 25 mg/kg due 10/10 match and age)-[place] 50 mg/kg CIVI daily, for 2 doses, starting 15 minutes before cyclophosphamide on D +3 & D +4-Stem cell infusion day 0 (09/24/22) HEME: 1 unit plts.- Transfuse pRBCs to maintain Hb >7 g/dL, platelets >82 or if clinically indicated- Filgrastim 37mcg/kg starting on day +5 until stable engraftment of neutrophils.ID: Prophylactic antimicrobials: Acyclovir 400 mg PO TID (home medications),  Zosyn, Isavuconazole 372 mg po daily (prior authorization pending, voriconazole allergy-gingival hyperplasia), Pentamidine 300 mg inhalation monthly- CMV -/pending, attempt to get letermovir for CMV prophylaxis. Will send script on Day +7 to determine insurance coverage before starting inpatient.Check CMV PCR weekly.- If febrile, panculture, UA and CXR, start broad spectrum empiric antibiotics if neutropenic or septic appearing.   - Repeat blood cx every 24 hours with temperature > 100.4 or septic appearing GVHD Prophylaxis:  - Tacrolimus  5mg  PO BID.  Increased dose given level only 2.2 . Goal of 6-11ng/ml- PT cyclophosphamide 25 mg/kg IV over 1.5 hrs every 24hr for 2 doses on D +3 & D +4 with mesna daily on Day +3 and Day +4  (PTCy dose reduced by 50% )- Monitor levels, haptoglobin, LDH, triglycerides.- Daily skin exam, liver function tests twice weekly (minimum), document stool output/day. Fluids, Electrolytes and Nutrition:- Regular diet, consider nutrition consult if needed- Daily weights- Strict Intake and output- Replete electrolytes PRNEmesis prophylaxis: Ondansetron 16 mg PO every 12 hours w/ PRN Compazine, Dexamethasone 12mg  po premed prior to melphalan High dose cyclophosphamide: doxepin premed 25 mg and diphenhydramine 25 mg 30 mins before Cytoxan; D5W 1/2NS + KCL 20 meq + Magnesium 1 gm alternating with D5W + of NaCHO3 + KCL 20 meq until 24 hours after last Cyclophosphamide dose (hold fluid during Cytoxan infusion)Diarrhea: imodium 2mg  PRN 4 times daily, lomotil 2.5-0.025 4x daily PRN  VOD: - Continue Ursodiol 500mg  PO BID, continue until Day +90- Daily weights, strict fluid monitoring of all intake/output- Monitor liver function tests and coags at least twice weekly HTNPatient reports not taking his antiHTN meds for the last few days due to feeling weak. 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 2) Forehead erythema and left eyelid erythema and swelling with purpura on eyelid - improving so will hold off on bx per derm, felt to be hordeolum, however, given pt counts will continue empiric tx for cellulitis- F/u bx results- warm compresses PRN for left eye + will start erythromycin ointment for possible rosacea  CVAD: Hickman for chemotherapy and supportive care Discharge Planning: Pending count recovery 24 hour caregiver: Signed:Soumya Colson Fredderick Severance PA-CMHB

## 2022-10-01 NOTE — Plan of Care
Plan of Care Overview/ Patient Status    1500-2300: Day +6 allo transplant. A+Ox4. Left eye swollen and red. Erythromycin ointment as ordered. OOB to BR. C/o discomfort at site abdominal stitches. Bandaid CDI. Afebrile. No c/o SOB. Bp 165/79, asymptomatic. Voiding yellow urine. No bm this shift. Poor po intake. Stated that he feels a little nauseas. Drank gingerale. Scheduled Zofran as ordered. Right double hickman with positive blood return, dressing CDI. Rash noted to trunk and face. Clindamycin ordered from pharmacy. Neutropenic precautions maintained. Will continue to monitor.

## 2022-10-01 NOTE — Plan of Care
Plan of Care Overview/ Patient Status    0700 - 1900Allo D7Alert and oriented x4. Calm and cooperative with care during shift. Afebrile, otherwise VSS in room air. Right DL hickman, dressing c/d/I, flushing without difficulty, positive blood return. KVO antibiotics. Administered and tolerated plt transfusion without premedications. LBM 8/14 voiding spontaneously. No c/o n/v/d. No c/o pain. Patient is independent. Call bell and personal belongings within reach. Turns and repositions independently q2h. Safety precautions maintained.

## 2022-10-01 NOTE — Plan of Care
Plan of Care Overview/ Patient Status    Problem: Adult Inpatient Plan of CareGoal: Plan of Care Review8/15/2024 0109 by Railynn Ballo, Mitzi Davenport, RNOutcome: Interventions implemented as appropriate8/15/2024 0109 by Emmons Toth, Mitzi Davenport, RNOutcome: Interventions implemented as appropriate 2300-0730:Day +7 MUDPatient A&Ox4, VSS on RA. Independent OOB w/ a steady gait.  Voids spontaneously, up to toilet. LBM 8/14.  No N/V this shift. No complaints of pain this shift.  Skin intact. Medications taken whole with water. RDL Hickman, dressing clean, dry, and intact, flushes well without difficulty, blood return present. No repletion's this shift.  No blood products administered this shift. Continues on IV ABX. Safety/Fall precautions maintained this shift. Call bell and personal items within reach. Safety and hourly rounds completed throughout shift. Care plan maintained throughout shift. Please see flow sheets for further details.  Mitzi Davenport Harshan Kearley, RN 10/01/2022

## 2022-10-02 LAB — PLATELET COUNT: BKR WAM PLATELETS: 22 x1000/ÂµL — ABNORMAL LOW (ref 150–420)

## 2022-10-02 LAB — ASPERGILLUS GALACTOMANNAN ANTIGEN (BH GH LMW Q YH)
BKR ASPERGILLUS ANTIGEN (GALACTOMANNAN): NEGATIVE
BKR ASPERGILLUS GALACTOMANNAN INDEX: 0.05 {index} (ref <0.50)

## 2022-10-02 LAB — CBC WITH AUTO DIFFERENTIAL
BKR WAM ABSOLUTE NRBC (2 DEC): 0 x 1000/ÂµL (ref 0.00–1.00)
BKR WAM ANC (ABSOLUTE NEUTROPHIL COUNT): 0.01 x 1000/ÂµL — ABNORMAL LOW (ref 2.00–7.60)
BKR WAM HEMATOCRIT (2 DEC): 21.8 % — ABNORMAL LOW (ref 38.50–50.00)
BKR WAM HEMOGLOBIN: 7.7 g/dL — ABNORMAL LOW (ref 13.2–17.1)
BKR WAM MCH (PG): 29.4 pg (ref 27.0–33.0)
BKR WAM MCHC: 35.3 g/dL (ref 31.0–36.0)
BKR WAM MCV: 83.2 fL (ref 80.0–100.0)
BKR WAM NUCLEATED RED BLOOD CELLS: 0 % (ref 0.0–1.0)
BKR WAM PLATELETS: 10 x1000/ÂµL — CL (ref 150–420)
BKR WAM RDW-CV: 14.3 % (ref 11.0–15.0)
BKR WAM RED BLOOD CELL COUNT.: 2.62 M/ÂµL — ABNORMAL LOW (ref 4.00–6.00)
BKR WAM WHITE BLOOD CELL COUNT: <0.1 x1000/ÂµL — ABNORMAL LOW (ref 4.0–11.0)

## 2022-10-02 LAB — COMPREHENSIVE METABOLIC PANEL
BKR A/G RATIO: 1 (ref 1.0–2.2)
BKR ALANINE AMINOTRANSFERASE (ALT): 18 U/L (ref 9–59)
BKR ALBUMIN: 3.2 g/dL — ABNORMAL LOW (ref 3.6–5.1)
BKR ALKALINE PHOSPHATASE: 83 U/L (ref 9–122)
BKR ANION GAP: 10 (ref 7–17)
BKR ASPARTATE AMINOTRANSFERASE (AST): 25 U/L (ref 10–35)
BKR AST/ALT RATIO: 1.4
BKR BILIRUBIN TOTAL: 0.6 mg/dL (ref <=1.2)
BKR BLOOD UREA NITROGEN: 6 mg/dL — ABNORMAL LOW (ref 8–23)
BKR BUN / CREAT RATIO: 7.5 — ABNORMAL LOW (ref 8.0–23.0)
BKR CALCIUM: 8.6 mg/dL — ABNORMAL LOW (ref 8.8–10.2)
BKR CHLORIDE: 108 mmol/L — ABNORMAL HIGH (ref 98–107)
BKR CO2: 22 mmol/L (ref 20–30)
BKR CREATININE: 0.8 mg/dL (ref 0.40–1.30)
BKR EGFR, CREATININE (CKD-EPI 2021): >60 mL/min/{1.73_m2} (ref >=60)
BKR GLOBULIN: 3.1 g/dL (ref 2.0–3.9)
BKR GLUCOSE: 90 mg/dL (ref 70–100)
BKR POTASSIUM: 3.6 mmol/L (ref 3.3–5.3)
BKR PROTEIN TOTAL: 6.3 g/dL (ref 5.9–8.3)
BKR SODIUM: 140 mmol/L (ref 136–144)

## 2022-10-02 LAB — IMMATURE PLATELET FRACTION (BH GH LMW YH)
BKR WAM IPF, ABSOLUTE: 0 x1000/ÂµL (ref <20.0)
BKR WAM IPF, ABSOLUTE: 0.2 x1000/ÂµL (ref <20.0)
BKR WAM IPF: 0.4 % — ABNORMAL LOW (ref 1.2–8.6)
BKR WAM IPF: 0.8 % — ABNORMAL LOW (ref 1.2–8.6)

## 2022-10-02 LAB — CYTOMEGALOVIRUS QUANTITATIVE PCR, PLASMA     (BH GH L LMW YH): BKR CMV QUANTITATIVE PCR: NOT DETECTED

## 2022-10-02 LAB — TACROLIMUS LEVEL     (BH GH L LMW YH): BKR TACROLIMUS BLOOD: 3.2 ng/mL — ABNORMAL LOW

## 2022-10-02 LAB — BLOOD CULTURE   (BH GH L LMW YH): BKR BLOOD CULTURE: NO GROWTH

## 2022-10-02 MED ORDER — AMLODIPINE 10 MG TABLET
10 | Freq: Every day | ORAL | Status: CP
Start: 2022-10-02 — End: ?
  Administered 2022-10-03 – 2022-10-10 (×8): 10 mg via ORAL

## 2022-10-02 MED ORDER — ZZ IMS TEMPLATE
Freq: Two times a day (BID) | ORAL | Status: DC
Start: 2022-10-02 — End: 2022-10-04
  Administered 2022-10-02 – 2022-10-04 (×4): 0.5 mg via ORAL

## 2022-10-02 MED ORDER — AMLODIPINE 5 MG TABLET
5 mg | Freq: Every day | ORAL | Status: DC
Start: 2022-10-02 — End: 2022-10-02
  Administered 2022-10-02: 12:00:00 5 mg via ORAL

## 2022-10-02 MED ORDER — TACROLIMUS IMMEDIATE RELEASE 1 MG CAPSULE
1 | Freq: Once | ORAL | Status: CP
Start: 2022-10-02 — End: ?
  Administered 2022-10-02: 15:00:00 1 mg via ORAL

## 2022-10-02 NOTE — Plan of Care
Plan of Care Overview/ Patient Status    0700 - 1900Allo [name] Alert and oriented x4. Calm and cooperative with care during shift. Afebrile, otherwise VSS in room air. Right DL hickman, dressing c/d/I, flushing without difficulty, positive blood return. KVO antibiotics. Administered and tolerated plt transfusion without premedications. LBM 8/16, voiding spontaneously. No c/o n/v/d. No c/o pain. Patient is independent. Call bell and personal belongings within reach. Turns and repositions independently q2h. Safety precautions maintained.

## 2022-10-02 NOTE — Progress Notes
Integrative MedicineProgress NoteInpatient Reiki: Integrative Medicine Patient AssessmentSubjective [name] is a 72 y.o. male who presents today with a goal of receiving Reiki for relaxation. Douglas [name] was described treatment and consented to goals of treatment.Objective Patient and practitioner discussed goals of treatment.Assessment:Patient received Reiki and responded favorably. Appointment Review:Douglas [name] received Reiki treatment for 20 minutes. Mode of Therapy: Reiki Touch  Disease Teams: HematologyPatient Location: NP 11Referred by: RNService provided to: PatientService Provided By: Randall Hiss Status: return Length of Appointment: 30 minutes

## 2022-10-02 NOTE — Progress Notes
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 His  forehead rash less tender today and overall he feels better. Some diarrhea and nausea on occasion, but seems better today. Area of eye less swollen and is no longer bothersome to him,He is using the ointment with improvement.  He has some fatigue, but plans to get up out of chair and move around more today. Eating most of his meals. Review of Systems Constitutional:  Positive for malaise/fatigue. Negative for chills and fever. HENT: Negative.  Negative for sore throat.  Eyes: Negative.  Respiratory: Negative.  Negative for cough, sputum production, shortness of breath and wheezing.  Cardiovascular: Negative.  Negative for chest pain, palpitations and leg swelling. Gastrointestinal:  Positive for diarrhea and nausea. Negative for abdominal pain, constipation and vomiting. Genitourinary:  Negative for dysuria, frequency and urgency. Musculoskeletal: Negative.  Skin:  Positive for rash. Negative for itching.      Toes discolored - not new Neurological: Negative.  Psychiatric/Behavioral: Negative.   Review of Allergies/Meds/Hx: Allergies Allergen Reactions  Voriconazole Other (See Comments)   Gingival edema/redness making it difficult to bite down  Medications:Current Facility-Administered Medications:   acetaminophen (TYLENOL) tablet 650 mg, 650 mg, Oral, Q6H PRN, Shen, Meifeng, PA acyclovir (ZOVIRAX) capsule 400 mg, 400 mg, Oral, Q8H, Chrystine Oiler, MD, 400 mg at 10/02/22 0558  albuterol neb sol 2.5 mg/3 mL (0.083%) (PROVENTIL,VENTOLIN), 2.5 mg, Nebulization, Q30 MIN PRN, Chrystine Oiler, MD  amLODIPine (NORVASC) tablet 5 mg, 5 mg, Oral, Daily, Thereasa Iannello A, PA, 5 mg at 10/02/22 0815  chlorhexidine gluconate (PERIDEX) 0.12 % solution 15 mL, 15 mL, Mouth/Throat, BID, Al-Musa, Amer, MD, 15 mL at 10/02/22 0815  clindamycin (CLEOCIN T) 1 % external solution, , Topical (Top), BID, Edder Bellanca A, [place], Given at 10/02/22 0815  diphenhydrAMINE (BENADRYL) injection 50 mg, 50 mg, IV Push, Once PRN, Chrystine Oiler, MD  diphenoxylate-atropine (LOMOTIL) 2.5-0.025 mg per tablet 1 tablet, 1 tablet, Oral, 4x DAILY PRN, Roetta Sessions, MD, 1 tablet at 09/29/22 1044  EPINEPHrine (EPI-PEN) auto-injector 0.3 mg, 0.3 mg, Intramuscular, Q15 MIN PRN, Chrystine Oiler, MD  EPINEPHrine (EPI-PEN) auto-injector 0.3 mg, 0.3 mg, Intramuscular, Q15 MIN PRN, Chrystine Oiler, MD  erythromycin (ROMYCIN) 5 mg/gram (0.5 %) ophthalmic ointment, , Left Eye, Nightly, Roetta Sessions, MD, Given at 09/30/22 2117  famotidine (PF) (PEPCID) 20 mg in sodium chloride 0.9% PF 5 mL Injection, 20 mg, IV Push, Once PRN, Chrystine Oiler, MD  filgrastim-sndz (ZARXIO) syringe 480 mcg, 5 mcg/kg (Treatment Plan Recorded), Subcutaneous, Daily (1700), Chrystine Oiler, MD, 480 mcg at 10/01/22 1734  finasteride (PROSCAR) tablet 5 mg, 5 mg, Oral, Daily, Al-Musa, Amer, MD, 5 mg at 10/02/22 0815  furosemide (LASIX) injection 20 mg, 20 mg, Intravenous, Once, Al-Musa, Denyse Dago, MD  hydrocortisone sodium succinate (PF) (Solu-CORTEF) injection 100 mg, 100 mg, IV Push, Once PRN, Chrystine Oiler, MD  isavuconazonium (Cresemba) capsule Cap 372 mg, 372 mg, Oral, Daily, Chrystine Oiler, MD, 372 mg at 10/02/22 0815  latanoprost (XALATAN) 0.005 % ophthalmic solution 1 drop, 1 drop, Both Eyes, Nightly, Al-Musa, Amer, MD, 1 drop at 09/30/22 2128  loperamide (IMODIUM) capsule  2 mg, 2 mg, Oral, 4x DAILY PRN, Roetta Sessions, MD, 2 mg at 09/29/22 2151  LORazepam (ATIVAN) tablet 0.5 mg, 0.5 mg, Oral, Q6H PRN, Chrystine Oiler, MD  normal saline 0.9 % oral rinse 30 mL, 30 mL, Oral, 4x DAILY PRN, William Hamburger, MD  ondansetron (PF) [facility]) injection 8 mg, 8 mg, IV Push, Q12H, Al-Musa, Amer, MD, 8 mg at 10/02/22 0558  piperacillin-tazobactam (ZOSYN) 4.5 g in sodium chloride 0.9% 100 mL (mini-bag plus), 4.5 g, Intravenous, Q6H, William Hamburger, MD, Last Rate: 33.3 mL/hr at 10/02/22 0558, 4.5 g at 10/02/22 0558  potassium chloride in sterile water 20 mEq/100 mL IVPB 20 mEq, 20 mEq, Intravenous, Q1H PRN, Magdalene River, MD, Last Rate: 100 mL/hr at 10/02/22 0936, 20 mEq at 10/02/22 0936  potassium chloride in sterile water 20 mEq/50 mL IVPB 20 mEq, 20 mEq, Intravenous, Q1H PRN, Magdalene River, MD  prochlorperazine edisylate (COMPAZINE) injection 10 mg, 10 mg, IV Push, Q6H PRN, Chrystine Oiler, MD, 10 mg at 09/25/22 1056  sodium chloride 0.9 % (new bag) bolus 500 mL, 500 mL, Intravenous, Once PRN, Chrystine Oiler, MD  sodium chloride 0.9 % flush 10 mL, 10 mL, IV Push, PRN for [facility], Chrystine Oiler, MD, 20 mL at 09/30/22 0939  sodium chloride 0.9 % flush 3 mL, 3 mL, IV Push, Q8H, Al-Musa, Amer, MD, 3 mL at 09/30/22 2111  sodium chloride 0.9 % flush 3 mL, 3 mL, IV Push, PRN for [facility], Roetta Sessions, MD  sodium chloride 0.9% infusion, 5 mL/hr, Intravenous, PRN, Chrystine Oiler, MD  tacrolimus (PROGRAF) immediate release capsule 6 mg, 6 mg, Oral, Q12H (0600-1800), Darious Rehman A, PA  ursodioL (URSO 250) tablet 500 mg, 500 mg, Oral, BID, Chrystine Oiler, MD, 500 mg at 10/02/22 0815Objective: Temp:  [97.3 ?F (36.3 ?C)-98.1 ?F (36.7 ?C)] 97.3 ?F (36.3 ?C)Pulse:  [50-58] 53Resp:  [17-20] 17BP: (147-178)/(70-80) 167/70SpO2:  [96 %-99 %] 98 %I/O's:Gross Totals (Last 24 hours) at 10/02/2022 1236Last data filed at 10/02/2022 0930Intake 297 ml Output 1750 ml Net -1453 ml Wt: 10/02/22 103.5 kg 09/16/22 104.2 kg 09/13/22 104.3 kg 09/11/22 105.9 kg 09/09/22 105.4 kg 09/04/22 106.4 kg Physical ExamConstitutional:     Appearance: Normal appearance. He is normal weight. He is not ill-appearing or diaphoretic. HENT:    Head: Normocephalic.    Nose: Nose normal.    Mouth/Throat:    Mouth: Mucous membranes are moist.    Pharynx: Oropharynx is clear. No oropharyngeal exudate. Eyes:    General: No scleral icterus.      Right eye: No discharge.       Left eye: No discharge. Cardiovascular:    Rate and Rhythm: Normal rate and regular rhythm.    Heart sounds: Normal heart sounds. No murmur heard.Pulmonary:    Effort: Pulmonary effort is normal. No respiratory distress.    Breath sounds: Normal breath sounds. No wheezing or rales. Abdominal:    General: Abdomen is flat. Bowel sounds are normal. There is no distension.    Palpations: Abdomen is soft.    Tenderness: There is no abdominal tenderness. There is no guarding. Musculoskeletal:       General: No swelling or deformity. Normal range of motion.    Right lower leg: No edema.    Left lower leg: No edema. Skin:   General: Skin is warm.    Findings: Rash (Mild erythema / edema of left eye lid; overall significantly improved; mild erythema over forehead) present.    Comments: Toes hyperpigmetned/?PVD - not newBruises,  petechiae scattered Neurological:    Mental Status: He is alert and oriented to person, place, and time. Psychiatric:       Mood and Affect: Mood normal. Labs:Recent Results (from the past 24 hour(s)) Tacrolimus level  Collection Time: 10/02/22  5:56 AM Result Value Ref Range  Tacrolimus Lvl 3.2 (L) See Comment ng/mL CBC auto differential  Collection Time: 10/02/22  5:56 AM Result Value Ref Range  WBC <0.1 (L) 4.0 - 11.0 x1000/?L  RBC 2.62 (L) 4.00 - 6.00 M/?L  Hemoglobin 7.7 (L) 13.2 - 17.1 g/dL  Hematocrit 64.33 (L) 29.51 - 50.00 %  MCV 83.2 80.0 - 100.0 fL  MCH 29.4 27.0 - 33.0 pg  MCHC 35.3 31.0 - 36.0 g/dL  RDW-CV 88.4 16.6 - 06.3 %  Platelets 10 (LL) 150 - 420 x1000/?L  MPV    Neutrophils    Lymphocytes    Monocytes    Eosinophils    Basophil    Immature Granulocytes    nRBC 0.0 0.0 - 1.0 %  Absolute Lymphocyte Count    Monocyte Absolute Count    Eosinophil Absolute Count    Basophil Absolute Count    Absolute Immature Granulocyte Count    Absolute nRBC 0.00 0.00 - 1.00 x 1000/?L  ANC (Abs Neutrophil Count) 0.01 (L) 2.00 - 7.60 x 1000/?L Comprehensive metabolic panel  Collection Time: 10/02/22  5:56 AM Result Value Ref Range  Sodium 140 136 - 144 mmol/L  Potassium 3.6 3.3 - 5.3 mmol/L  Chloride 108 (H) 98 - 107 mmol/L  CO2 22 20 - 30 mmol/L  Anion Gap 10 7 - 17  Glucose 90 70 - 100 mg/dL  BUN 6 (L) 8 - 23 mg/dL  Creatinine 0.16 0.10 - 1.30 mg/dL  Calcium 8.6 (L) 8.8 - 10.2 mg/dL  BUN/Creatinine Ratio 7.5 (L) 8.0 - 23.0  Total Protein 6.3 5.9 - 8.3 g/dL  Albumin 3.2 (L) 3.6 - 5.1 g/dL  Total Bilirubin 0.6 <=9.3 mg/dL  Alkaline Phosphatase 83 9 - 122 U/L  Alanine Aminotransferase (ALT) 18 9 - 59 U/L  Aspartate Aminotransferase (AST) 25 10 - 35 U/L  Globulin 3.1 2.0 - 3.9 g/dL  A/G Ratio 1.0 1.0 - 2.2  AST/ALT Ratio 1.4 Reference Range Not Established  eGFR (Creatinine) >60 >=60 mL/min/1.37m2 Immature Platelet Fraction (BH GH LMW YH)  Collection Time: 10/02/22  5:56 AM Result Value Ref Range  Immature Platelet Fraction 0.4 (L) 1.2 - 8.6 %  Absolute Immature Platelet Fraction 0.0 <20.0 x1000/?L Diagnostics:No results found.Assessment: DEE STEENO is a 72 y.o. 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 He is s/p 2 cycles of chemotherapy with Decitabine + Venetoclax with reduction in blast count. 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  Plan:  Day +8: Continue supportive care. Topicals including clindaymycin and erythromycin for skin folliculitis and eye swelling/erythema. 1 unit plts. Post plt count ordered.  Increase tacrolimus due to persistently low level to 6mg  BID.  Conditioning/Transplant: (completed)-Melphalan 70 mg/m2 over 30 minutes on D-6, *Melphalan dosed reduced to 70 mg/m2 due to age-Fludarabine 40 mg/m2 over 30 minutes every 24hr for 4 doses from D-5 to D -2             -PT cyclophosphamide 25 mg/kg IV over 1.5 hrs every 24hr for 2 doses on D +3 & D +4 (reduced by 50% to 25  mg/kg due 10/10 match and age)-Mesna 50 mg/kg CIVI daily, for 2 doses, starting 15 minutes before cyclophosphamide on D +3 & D +4-Stem cell infusion day 0 (09/24/22) HEME: 1 unit plts.- Transfuse pRBCs to maintain Hb >7 g/dL, platelets >55 or if clinically indicated- Filgrastim 26mcg/kg starting on day +5 until stable engraftment of neutrophils.ID: Prophylactic antimicrobials: Acyclovir 400 mg PO TID (home medications),  Zosyn, Isavuconazole 372 mg po daily (prior authorization pending, voriconazole allergy-gingival hyperplasia), Pentamidine 300 mg inhalation monthly- CMV -/pending, attempt to get letermovir for CMV prophylaxis. Will send script on Day +7 to determine insurance coverage before starting inpatient.Check CMV PCR weekly.- If febrile, panculture, UA and CXR, start broad spectrum empiric antibiotics if neutropenic or septic appearing.   - Repeat blood cx every 24 hours with temperature > 100.4 or septic appearing GVHD Prophylaxis:  - Tacrolimus 6mg  PO BID.  Increased from 5mg  BID, due to level of 3.2. Goal of 6-11ng/ml- PT cyclophosphamide 25 mg/kg IV over 1.5 hrs every 24hr for 2 doses on D +3 & D +4 with mesna daily on Day +3 and Day +4  (PTCy dose reduced by 50% )- Monitor levels, haptoglobin, LDH, triglycerides.- Daily skin exam, liver function tests twice weekly (minimum), document stool output/day. Fluids, Electrolytes and Nutrition:- Regular diet, consider nutrition consult if needed- Daily weights- Strict Intake and output- Replete electrolytes PRNEmesis prophylaxis: Ondansetron 16 mg PO every 12 hours w/ PRN Compazine, Dexamethasone 12mg  po premed prior to melphalan High dose cyclophosphamide: doxepin premed 25 mg and diphenhydramine 25 mg 30 mins before Cytoxan; D5W 1/2NS + KCL 20 meq + Magnesium 1 gm alternating with D5W + of NaCHO3 + KCL 20 meq until 24 hours after last Cyclophosphamide dose (hold fluid during Cytoxan infusion)Diarrhea: imodium 2mg  PRN 4 times daily, lomotil 2.5-0.025 4x daily PRN  VOD: - Continue Ursodiol 500mg  PO BID, continue until Day +90- Daily weights, strict fluid monitoring of all intake/output- Monitor liver function tests and coags at least twice weekly HTN: Increased Norvasc to 10mg  in the setting of SBP 150s-160s. Continue to monitor Patient reports not taking his antiHTN meds for the last few days due to feeling weak. PTA. 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 2) Forehead erythema and left eyelid erythema and swelling with purpura on eyelid - improving so will hold off on bx per derm, felt to be hordeolum, however, given pt counts will continue empiric tx for cellulitis- F/u bx results- warm compresses PRN for left eye + will start erythromycin ointment for possible rosacea  CVAD: [facility] for chemotherapy and supportive care Discharge Planning: Pending count recovery 24 hour caregiver: Signed:Rynell Ciotti Fredderick Severance PA-CMHB

## 2022-10-02 NOTE — Progress Notes
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 Negative for chills and fever. HENT: Negative.  Negative for sore throat.  Eyes: Negative.  Respiratory: Negative.  Negative for cough, sputum production, shortness of breath and wheezing.  Cardiovascular: Negative.  Negative for chest pain, palpitations and leg swelling. Gastrointestinal:  Positive for diarrhea and nausea. Negative for abdominal pain, constipation and vomiting. Genitourinary:  Negative for dysuria, frequency and urgency. Musculoskeletal: Negative.  Skin:  Positive for rash. Negative for itching.      Toes discolored - not new Neurological: Negative.  Psychiatric/Behavioral: Negative.   Review of Allergies/Meds/Hx: Allergies Allergen Reactions  Voriconazole Other (See Comments)   Gingival edema/redness making it difficult to bite down  Medications:Current Facility-Administered Medications:   acetaminophen (TYLENOL) tablet 650 mg, 650 mg, Oral, Q6H PRN, Flonnie Hailstone, [place], PA  acyclovir (ZOVIRAX) capsule 400 mg, 400 mg, Oral, Q8H, Chrystine Oiler, MD, 400 mg at 10/01/22 0622  albuterol neb sol 2.5 mg/3 mL (0.083%) (PROVENTIL,VENTOLIN), 2.5 mg, Nebulization, Q30 MIN PRN, Chrystine Oiler, MD  chlorhexidine gluconate (PERIDEX) 0.12 % solution 15 mL, 15 mL, Mouth/Throat, BID, Al-Musa, Amer, MD, 15 mL at 09/30/22 2111  clindamycin (CLEOCIN T) 1 % external solution, , Topical (Top), BID, Rall, Kerri A, PA  diphenhydrAMINE (BENADRYL) injection 50 mg, 50 mg, IV Push, Once PRN, Chrystine Oiler, MD  diphenoxylate-atropine (LOMOTIL) 2.5-0.025 mg per tablet 1 tablet, 1 tablet, Oral, 4x DAILY PRN, Roetta Sessions, MD, 1 tablet at 09/29/22 1044  EPINEPHrine (EPI-PEN) auto-injector 0.3 mg, 0.3 mg, Intramuscular, Q15 MIN PRN, Chrystine Oiler, MD  EPINEPHrine (EPI-PEN) auto-injector 0.3 mg, 0.3 mg, Intramuscular, Q15 MIN PRN, Chrystine Oiler, MD  erythromycin (ROMYCIN) 5 mg/gram (0.5 %) ophthalmic ointment, , Left Eye, Nightly, Roetta Sessions, MD, Given at 09/30/22 2117  famotidine (PF) (PEPCID) 20 mg in sodium chloride 0.9% PF 5 mL Injection, 20 mg, IV Push, Once PRN, Chrystine Oiler, MD  filgrastim-sndz (ZARXIO) syringe 480 mcg, 5 mcg/kg (Treatment Plan Recorded), Subcutaneous, Daily (1700), Chrystine Oiler, MD, 480 mcg at 09/30/22 1851  finasteride (PROSCAR) tablet 5 mg, 5 mg, Oral, Daily, Al-Musa, Amer, MD, 5 mg at 09/30/22 0940  furosemide (LASIX) injection 20 mg, 20 mg, Intravenous, Once, Al-Musa, Denyse Dago, MD  hydrocortisone sodium succinate (PF) (Solu-CORTEF) injection 100 mg, 100 mg, IV Push, Once PRN, Chrystine Oiler, MD  isavuconazonium (Cresemba) capsule Cap 372 mg, 372 mg, Oral, Daily, Chrystine Oiler, MD, 372 mg at 09/30/22 0940  latanoprost (XALATAN) 0.005 % ophthalmic solution 1 drop, 1 drop, Both Eyes, Nightly, Al-Musa, Amer, MD, 1 drop at 09/30/22 2128  loperamide (IMODIUM) capsule 2 mg, 2 mg, Oral, 4x DAILY PRN, Roetta Sessions, MD, 2 mg at 09/29/22 2151  LORazepam (ATIVAN) tablet 0.5 mg, 0.5 mg, Oral, Q6H PRN, Chrystine Oiler, MD  normal saline 0.9 % oral rinse 30 mL, 30 mL, Oral, 4x DAILY PRN, William Hamburger, MD  ondansetron (PF) [facility]) injection 8 mg, 8 mg, IV Push, Q12H, Al-Musa, Amer, MD, 8 mg at 10/01/22 0622  piperacillin-tazobactam (ZOSYN) 4.5 g in sodium chloride 0.9% 100 mL (mini-bag plus), 4.5 g,  Intravenous, Q6H, William Hamburger, MD, Last Rate: 33.3 mL/hr at 10/01/22 0624, 4.5 g at 10/01/22 0624  potassium chloride in sterile water 20 mEq/100 mL IVPB 20 mEq, 20 mEq, Intravenous, Q1H PRN, Magdalene River, MD, Last Rate: 100 mL/hr at 09/30/22 0915, 20 mEq at 09/30/22 0915  potassium chloride in sterile water 20 mEq/50 mL IVPB 20 mEq, 20 mEq, Intravenous, Q1H PRN, Magdalene River, MD  prochlorperazine edisylate (COMPAZINE) injection 10 mg, 10 mg, IV Push, Q6H PRN, Chrystine Oiler, MD, 10 mg at 09/25/22 1056  sodium chloride 0.9 % (new bag) bolus 500 mL, 500 mL, Intravenous, Once PRN, Chrystine Oiler, MD  sodium chloride 0.9 % flush 10 mL, 10 mL, IV Push, PRN for [facility], Chrystine Oiler, MD, 20 mL at 09/30/22 0939  sodium chloride 0.9 % flush 3 mL, 3 mL, IV Push, Q8H, Al-Musa, Amer, MD, 3 mL at 09/30/22 2111  sodium chloride 0.9 % flush 3 mL, 3 mL, IV Push, PRN for [facility], Roetta Sessions, MD  sodium chloride 0.9% infusion, 5 mL/hr, Intravenous, PRN, Chrystine Oiler, MD  tacrolimus (PROGRAF) immediate release capsule 4 mg, 4 mg, Oral, Q12H (0600-1800), Rall, Kerri A, PA, 4 mg at 10/01/22 6578  ursodioL (URSO 250) tablet 500 mg, 500 mg, Oral, BID, Chrystine Oiler, MD, 500 mg at 09/30/22 2110Objective: Temp:  [97.3 ?F (36.3 ?C)-98.6 ?F (37 ?C)] 98.3 ?F (36.8 ?C)Pulse:  [50-72] 50Resp:  [16-19] 18BP: (144-169)/(71-80) 169/76SpO2:  [95 %-99 %] 96 %I/O's:Gross Totals (Last 24 hours) at 10/01/2022 0711Last data filed at 10/01/2022 0610Intake 1401 ml Output 650 ml Net 751 ml Wt: 10/01/22 105.3 kg 09/16/22 104.2 kg 09/13/22 104.3 kg 09/11/22 105.9 kg 09/09/22 105.4 kg 09/04/22 106.4 kg Physical ExamConstitutional:     Appearance: Normal appearance. He is normal weight. He is not ill-appearing or diaphoretic. HENT:    Head: Normocephalic.    Nose: Nose normal.    Mouth/Throat:    Mouth: Mucous membranes are moist.    Pharynx: Oropharynx is clear. No oropharyngeal exudate. Eyes:    General: No scleral icterus.      Right eye: No discharge.       Left eye: No discharge. Cardiovascular:    Rate and Rhythm: Normal rate and regular rhythm.    Heart sounds: Normal heart sounds. No murmur heard.Pulmonary:    Effort: Pulmonary effort is normal. No respiratory distress.    Breath sounds: Normal breath sounds. No wheezing or rales. Abdominal:    General: Abdomen is flat. Bowel sounds are normal. There is no distension.    Palpations: Abdomen is soft.    Tenderness: There is no abdominal tenderness. There is no guarding. Musculoskeletal:       General: No swelling or deformity. Normal range of motion.    Right lower leg: No edema.    Left lower leg: No edema. Skin:   General: Skin is warm.    Findings: Rash (left eyelid swollen with ?purpura, erythema over forehead less pronounced compared to yesterday) present.    Comments: Toes hyperpigmetned/?PVD - not newBruises, petechiae scattered Neurological:    Mental Status: He is alert and oriented to person, place, and time. Psychiatric:       Mood and Affect: Mood normal. Labs:No results found for this or any previous visit (from the past 24 hour(s)).Recent Lab Values     09/27/2022 09/28/2022 09/29/2022 09/30/2022 10/01/2022    6:15 PM  5:04 AM  4:55 AM  5:29 AM  6:20 AM  WBC 0.2 0.1 <0.1 <0.1 <0.1  Hemoglobin 6.9 7.4 7.6 7.1 7.8  Platelet 11 7 12 11 8   ANC 0.06 0.02 0.01  0.01  AST -- 20 20 21 24   ALT -- 19 12 13 19   Tacrolimus -- -- -- 0.9 2.2  Serum Creatinine -- 0.81 0.96 0.78 0.72  Bilirubin -- 0.6 0.6 0.7 0.7     Comment for Platelet at  5:04 AM on 09/28/2022: Not called, previous critical value reported to LIP in last 5 days.  Comment for Platelet at  5:29 AM on 09/30/2022: Platelet count verified by smear.  Comment for Platelet at  6:20 AM on 10/01/2022: Not called, previous critical value reported to LIP in last 5 days.  Comment for ANC at  5:29 AM on 09/30/2022: Not measured  Comment for ALT at  5:04 AM on 09/28/2022: Calcium dobesilate can cause artificially low ALT results at therapeutic concentrations  Comment for ALT at  4:55 AM on 09/29/2022: Calcium dobesilate can cause artificially low ALT results at therapeutic concentrations  Comment for ALT at  5:29 AM on 09/30/2022: Calcium dobesilate can cause artificially low ALT results at therapeutic concentrations  Comment for ALT at  6:20 AM on 10/01/2022: Calcium dobesilate can cause artificially low ALT results at therapeutic concentrations  Comment for Tacrolimus at  5:29 AM on 09/30/2022: As of 01/05/18, this assay is being performed on the Roche Cobas 8000 system from LC/MS/MS. If a direct comparison of a prior sample (tested by previous method) and the current sample is desired, please contact the Chemistry laboratory for additional testing.Reference range applies to trough level only. Testing performed by automated clinical chemistry analyzers. Therapeutic Reference Range (Trough): 5.0 - 20 ng/mL  Comment for Tacrolimus at  6:20 AM on 10/01/2022: As of 01/05/18, this assay is being performed on the Roche Cobas 8000 system from LC/MS/MS. If a direct comparison of a prior sample (tested by previous method) and the current sample is desired, please contact the Chemistry laboratory for additional testing.Reference range applies to trough level only. Testing performed by automated clinical chemistry analyzers. Therapeutic Reference Range (Trough): 5.0 - 20 ng/mL  Diagnostics:No results found.Assessment: DIAR FELMLEE is a 72 y.o. 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 He is s/p 2 cycles of chemotherapy with Decitabine + Venetoclax with reduction in blast count. 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  Plan:  Day +7: Skin biopsy noted- supporative folliculitisImprovement with erythromycin and clindamycin Chemo associated pancytopeniaContinue zosyn Conditioning/Transplant: (completed)-Melphalan 70 mg/m2 over 30 minutes on D-6, *Melphalan dosed reduced to 70 mg/m2 due to age-Fludarabine 40 mg/m2 over 30 minutes every 24hr for 4 doses from D-5 to D -2             -PT cyclophosphamide 25 mg/kg IV over 1.5 hrs every 24hr for 2 doses on D +3 & D +4 (reduced by 50% to 25 mg/kg due 10/10 match and age)-Mesna 50 mg/kg CIVI daily, for 2 doses, starting 15 minutes before cyclophosphamide on D +3 & D +4-Stem cell infusion day 0 (09/24/22) HEME: - Transfuse pRBCs if  Hb < 7 g/dL, platelets < 10 or if clinically indicated- Filgrastim 15mcg/kg starting on day +5 until stable engraftment of neutrophils.ID: Prophylactic antimicrobials: Acyclovir 400 mg PO TID (home medications),  Zosyn, Isavuconazole 372 mg po daily (prior authorization pending, voriconazole allergy-gingival hyperplasia), Pentamidine 300 mg inhalation monthlyTopical erythromycin- CMV -/pending,  attempt to get letermovir for CMV prophylaxis. Will send script on Day +7 to determine insurance coverage before starting inpatient.Check CMV PCR weekly.- If febrile, panculture, UA and CXR, start broad spectrum empiric antibiotics if neutropenic or septic appearing.   - Repeat blood cx every 24 hours with temperature > 100.4 or septic appearing GVHD Prophylaxis:  - Tacrolimus  3mg  PO starting day +5.  Goal of 6-11ng/ml- PT cyclophosphamide 25 mg/kg IV over 1.5 hrs every 24hr for 2 doses on D +3 & D +4 with mesna daily on Day +3 and Day +4  (PTCy dose reduced by 50% )- Monitor levels, haptoglobin, LDH, triglycerides.- Daily skin exam, liver function tests twice weekly (minimum), document stool output/day. Fluids, Electrolytes and Nutrition:- Regular diet, consider nutrition consult if needed- Daily weights- Strict Intake and output- Replete electrolytes PRNEmesis prophylaxis: Ondansetron 16 mg PO every 12 hours w/ PRN Compazine, Dexamethasone 12mg  po premed prior to melphalan High dose cyclophosphamide: doxepin premed 25 mg and diphenhydramine 25 mg 30 mins before Cytoxan; D5W 1/2NS + KCL 20 meq + Magnesium 1 gm alternating with D5W + of NaCHO3 + KCL 20 meq until 24 hours after last Cyclophosphamide dose (hold fluid during Cytoxan infusion)Diarrhea: imodium 2mg  PRN 4 times daily, lomotil 2.5-0.025 4x daily PRN  VOD: - Continue Ursodiol 500mg  PO BID, continue until Day +90- Daily weights, strict fluid monitoring of all intake/output- Monitor liver function tests and coags at least twice weekly HTNPatient reports not taking his antiHTN meds for the last few days due to feeling weak. 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

## 2022-10-02 NOTE — Plan of Care
Douglas [name]					Location: NP [id number] y.o., male				Attending: Kalman Drape, MD	Admit Date: 09/18/2022			[id number] LOS: 14 days FOLLOW UP NUTRITION ASSESSMENTDIET ORDER: Regular dietNutrition Supplements: Ensure HP PRN VanillaANTHROPOMETRICS:Height: 71Admit wt: 102.9 kg 	   Current wt: 103.5 kg   BMI: 31.82Dosing weight: 86kg ([place] +10%)Additional wt information:Pt confirmed ht of 5'11 (71). Pt with significnt wt loss of 10.2 kg (9%) within the last 3 months.  Wt s trending dup over the last, suspect related to fluid shift, pt  with 1+ B/L LE edema per flowsheets, net + 1.6L since admission .  Will monitor wt trends.Lowest wt for each month available in EMR over the last year:09/25/22	102.3 kg07/31/24	104.2 kg06/26/24	111 kg05/22/24	112.5 kg04/12/24	112.8 kgNFPE:No overt depletion obsevedSKIN: No skin related changes that require adjustment to nutrition estimated needs.  ESTIMATED NUTRITION REQUIREMENTS:Kcal/day: 2580	(30kcal/kg)Protein/day: 103- 129	(1.2- 1.5gm/kg)Fluids/day: Fluid management per medical team discretion. 2580 mL (62mL/kcal) Needs based on: Dosing wt (86kg), AML, Allo SCT (day 0= 8/8) NUTRITION ASSESSMENT: Chart reviewed for follow up assessment. Labs and meds reviewed. Completing 0- 50% of meals when intake is documented. Loose stools/ diarrhea noted per RN documentation. Met with pt and spouse today. Pt had oral sores that were inhibiting po intake, no intake of RMK milkshake for ~3- 4 days. Pt reports improvement in appetite  and oral sores over the last 2 days. Tolerating intake of soft/ easy to swallow foods including cream of wheat and oatmeal. Pt requests to d/c RMK milkshake and adjust ONS order to Ensure + PRN from Ensure HP Vanilla PRN. Pt is falling short of nutrition targets. Will continue to follow.Meds:Zofran q12, PRN Lomotil and imodoim onboard, last given on 8/13. PRN K repletionLabs: lytes wnl, BUN 6GI: LBM 8/16Cultural/Religious/Ethnic Needs: NoFood Allergies:NKFANUTRITION DIAGNOSIS:Severe malnutrition related to acute illness as evidenced by energy intake < 50% for > 5 days and wt loss > 7.5%/ 3 months - continuesINTERVENTIONS/RECOMMENDATIONS: Meals and Snacks:- Regular diet with bottled water and PC juices only, Coffee/ tea OK- Small, frequent meals- Allow pediatric menuGoal:   Patient to consume an average of > 50-75% meals prior to next nutrition assessment - not met, continueNutrition Supplement Therapy:- Adjust to Ensure +  PRN- D/c RMK Milkshake  consisting of 8oz whole milk, 3 Strawberry ice creams, and 3 scoops beneprotein (614kcal, 32g protein)Goal:  Patient to consume an average of 1- 2 oral nutrition supplements/day prior to next nutrition assessment- not met consistently, continue Nutrition Education (8/9): - A Guide to Good Nutrition	Provided education material, pt prefers to review on his own and will reach out with questions if needed.Goal:  Patient able to identify 2 principals of food safety prior to next nutrition assessment- not met, continueDischarge Planning and Transfer of Nutrition Care:  Nutrition related discharge needs still being determined at this time, will continue to follow  MONITORING / EVALUATION:Food/Nutrition-Related History:Food and Nutrient IntakeEnteral and Parenteral Nutrition IntakeAnthropometric Measurements:Weight Biochemical Data, Medical Tests and Procedures:Electrolyte and renal profileGlucose/endocrine profileNutrition-Focus Physical FindingsOverall findingsElectronically signed by Cyril Loosen, RD, August 16, 2024[facility] Dynamic Role - Nutrition Service - [facility] Adult Service Please note: Nutrition is a consult only service on weekends and holidays.  Please enter a consult in EPIC if assistance is needed on a weekend or holiday or via [facility] Dynamic Role as ?Food & Nutrition Adult Weekend Dietitian [facility]? to contact the covering RD.?

## 2022-10-02 NOTE — Plan of Care
Plan of Care Overview/ Patient Status    Problem: Adult Inpatient Plan of CareGoal: Plan of Care Review8/16/2024 0155 by Dazhane Villagomez, Mitzi Davenport, RNOutcome: Interventions implemented as appropriate8/16/2024 0155 by Lucille Witts, Mitzi Davenport, RNOutcome: Interventions implemented as appropriate 1900-0730:Day +8 MUDPatient A&Ox4, VSS on RA. Independent OOB w/ a steady gait.  Voids spontaneously, up to toilet. LBM 8/15.  No N/V this shift. No complaints of pain this shift.  Skin w/ mild redness/swelling to L eye, resolving rash to abdomen, clindaymycin cream applied per mar.  Pt refusing eye drops & eye ointment this evening. Medications taken whole with water. RDL Hickman, dressing clean, dry, and intact, flushes well without difficulty, blood return present. No repletion's this shift.  No blood products administered this shift. Continues on IV ABX. Safety/Fall precautions maintained this shift. Call bell and personal items within reach. Safety and hourly rounds completed throughout shift. Care plan maintained throughout shift. Please see flow sheets for further details. Mitzi Davenport Taysen Bushart, RN 10/02/2022

## 2022-10-03 ENCOUNTER — Ambulatory Visit: Admit: 2022-10-03 | Payer: MEDICARE

## 2022-10-03 LAB — COMPREHENSIVE METABOLIC PANEL
BKR A/G RATIO: 0.9 — ABNORMAL LOW (ref 1.0–2.2)
BKR ALANINE AMINOTRANSFERASE (ALT): 21 U/L (ref 9–59)
BKR ALBUMIN: 3.1 g/dL — ABNORMAL LOW (ref 3.6–5.1)
BKR ALKALINE PHOSPHATASE: 82 U/L (ref 9–122)
BKR ANION GAP: 14 (ref 7–17)
BKR ASPARTATE AMINOTRANSFERASE (AST): 22 U/L (ref 10–35)
BKR AST/ALT RATIO: 1
BKR BILIRUBIN TOTAL: 0.5 mg/dL (ref <=1.2)
BKR BLOOD UREA NITROGEN: 7 mg/dL — ABNORMAL LOW (ref 8–23)
BKR BUN / CREAT RATIO: 9.1 (ref 8.0–23.0)
BKR CALCIUM: 8.6 mg/dL — ABNORMAL LOW (ref 8.8–10.2)
BKR CHLORIDE: 110 mmol/L — ABNORMAL HIGH (ref 98–107)
BKR CO2: 20 mmol/L (ref 20–30)
BKR CREATININE: 0.77 mg/dL (ref 0.40–1.30)
BKR EGFR, CREATININE (CKD-EPI 2021): >60 mL/min/{1.73_m2} (ref >=60)
BKR GLOBULIN: 3.3 g/dL (ref 2.0–3.9)
BKR GLUCOSE: 87 mg/dL (ref 70–100)
BKR POTASSIUM: 3.8 mmol/L (ref 3.3–5.3)
BKR PROTEIN TOTAL: 6.4 g/dL (ref 5.9–8.3)
BKR SODIUM: 144 mmol/L (ref 136–144)

## 2022-10-03 LAB — PT/INR AND PTT (BH GH L LMW YH)
BKR INR: 1.26 — ABNORMAL HIGH (ref 0.86–1.12)
BKR PARTIAL THROMBOPLASTIN TIME: 31.7 s — ABNORMAL HIGH (ref 23.0–31.4)
BKR PROTHROMBIN TIME: 13.8 s — ABNORMAL HIGH (ref 9.6–12.3)

## 2022-10-03 LAB — CBC WITH AUTO DIFFERENTIAL
BKR WAM ABSOLUTE NRBC (2 DEC): 0 x 1000/ÂµL (ref 0.00–1.00)
BKR WAM ANC (ABSOLUTE NEUTROPHIL COUNT): 0 x 1000/ÂµL — ABNORMAL LOW (ref 2.00–7.60)
BKR WAM HEMATOCRIT (2 DEC): 22.2 % — ABNORMAL LOW (ref 38.50–50.00)
BKR WAM HEMOGLOBIN: 7.7 g/dL — ABNORMAL LOW (ref 13.2–17.1)
BKR WAM MCH (PG): 29.1 pg (ref 27.0–33.0)
BKR WAM MCHC: 34.7 g/dL (ref 31.0–36.0)
BKR WAM MCV: 83.8 fL (ref 80.0–100.0)
BKR WAM NUCLEATED RED BLOOD CELLS: 0 % (ref 0.0–1.0)
BKR WAM PLATELETS: 14 x1000/ÂµL — ABNORMAL LOW (ref 150–420)
BKR WAM RDW-CV: 14.4 % (ref 11.0–15.0)
BKR WAM RED BLOOD CELL COUNT.: 2.65 M/ÂµL — ABNORMAL LOW (ref 4.00–6.00)
BKR WAM WHITE BLOOD CELL COUNT: <0.1 x1000/ÂµL — ABNORMAL LOW (ref 4.0–11.0)

## 2022-10-03 LAB — FUNGITELL(R) (1-3) B D GLUCAN ASSAY (BH GH LMW Q YH)
FUNGITELL QUALITATIVE RESULT: NEGATIVE
FUNGITELL QUANTITATIVE VALUE: <31 pg/mL (ref <60)

## 2022-10-03 LAB — HAPTOGLOBIN: BKR HAPTOGLOBIN: 372 mg/dL — ABNORMAL HIGH (ref 30–200)

## 2022-10-03 LAB — MAGNESIUM: BKR MAGNESIUM: 1.9 mg/dL (ref 1.7–2.4)

## 2022-10-03 LAB — TACROLIMUS LEVEL     (BH GH L LMW YH): BKR TACROLIMUS BLOOD: 5.9 ng/mL

## 2022-10-03 LAB — IMMATURE PLATELET FRACTION (BH GH LMW YH)
BKR WAM IPF, ABSOLUTE: 0.1 x1000/ÂµL (ref <20.0)
BKR WAM IPF: 1 % — ABNORMAL LOW (ref 1.2–8.6)

## 2022-10-03 LAB — LACTATE DEHYDROGENASE: BKR LACTATE DEHYDROGENASE: 322 U/L — ABNORMAL HIGH (ref 122–241)

## 2022-10-03 NOTE — Progress Notes
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 His  forehead rash less tender today and overall he feels better. Some diarrhea and nausea on occasion, but seems better today. Had two liquid stools yesterday. Appetite betterEating most of his meals. Review of Systems Constitutional:  Positive for malaise/fatigue. Negative for chills and fever. HENT: Negative.  Negative for sore throat.  Eyes: Negative.  Respiratory: Negative.  Negative for cough, sputum production, shortness of breath and wheezing.  Cardiovascular: Negative.  Negative for chest pain, palpitations and leg swelling. Gastrointestinal:  Positive for diarrhea and nausea. Negative for abdominal pain, constipation and vomiting. Genitourinary:  Negative for dysuria, frequency and urgency. Musculoskeletal: Negative.  Skin:  Positive for rash. Negative for itching.      Toes discolored - not new Neurological: Negative.  Psychiatric/Behavioral: Negative.   Review of Allergies/Meds/Hx: Allergies Allergen Reactions  Voriconazole Other (See Comments)   Gingival edema/redness making it difficult to bite down  Medications:Current Facility-Administered Medications:   acetaminophen (TYLENOL) tablet 650 mg, 650 mg, Oral, Q6H PRN, Flonnie Hailstone, [place], PA  acyclovir (ZOVIRAX) capsule 400 mg, 400 mg, Oral, Q8H, Chrystine Oiler, MD, 400 mg at 10/03/22 0520  albuterol neb sol 2.5 mg/3 mL (0.083%) (PROVENTIL,VENTOLIN), 2.5 mg, Nebulization, Q30 MIN PRN, Chrystine Oiler, MD  amLODIPine (NORVASC) tablet 10 mg, 10 mg, Oral, Daily, Rall, Kerri A, PA  chlorhexidine gluconate (PERIDEX) 0.12 % solution 15 mL, 15 mL, Mouth/Throat, BID, Al-Musa, Amer, MD, 15 mL at 10/02/22 2100  clindamycin (CLEOCIN T) 1 % external solution, , Topical (Top), BID, Rall, Kerri A, [place], Given at 10/02/22 2103  diphenhydrAMINE (BENADRYL) injection 50 mg, 50 mg, IV Push, Once PRN, Chrystine Oiler, MD  diphenoxylate-atropine (LOMOTIL) 2.5-0.025 mg per tablet 1 tablet, 1 tablet, Oral, 4x DAILY PRN, Roetta Sessions, MD, 1 tablet at 10/02/22 1731  EPINEPHrine (EPI-PEN) auto-injector 0.3 mg, 0.3 mg, Intramuscular, Q15 MIN PRN, Chrystine Oiler, MD  EPINEPHrine (EPI-PEN) auto-injector 0.3 mg, 0.3 mg, Intramuscular, Q15 MIN PRN, Chrystine Oiler, MD  erythromycin (ROMYCIN) 5 mg/gram (0.5 %) ophthalmic ointment, , Left Eye, Nightly, Al-Musa, Denyse Dago, MD, Given at 10/02/22 2101  famotidine (PF) (PEPCID) 20 mg in sodium chloride 0.9% PF 5 mL Injection, 20 mg, IV Push, Once PRN, Chrystine Oiler, MD  filgrastim-sndz (ZARXIO) syringe 480 mcg, 5 mcg/kg (Treatment Plan Recorded), Subcutaneous, Daily (1700), Chrystine Oiler, MD, 480 mcg at 10/02/22 1731  finasteride (PROSCAR) tablet 5 mg, 5 mg, Oral, Daily, Al-Musa, Amer, MD, 5 mg at 10/02/22 0815  furosemide (LASIX) injection 20 mg, 20 mg, Intravenous, Once, Al-Musa, Denyse Dago, MD  hydrocortisone sodium succinate (PF) (Solu-CORTEF) injection 100 mg, 100 mg, IV Push, Once PRN, Chrystine Oiler, MD  isavuconazonium (Cresemba) capsule Cap 372 mg, 372 mg, Oral, Daily, Chrystine Oiler, MD, 372 mg at 10/02/22 0815  latanoprost (XALATAN) 0.005 % ophthalmic solution 1 drop, 1 drop, Both Eyes, Nightly, Al-Musa, Amer, MD, 1 drop at 10/02/22 2101  loperamide (IMODIUM) capsule 2 mg, 2 mg, Oral, 4x DAILY PRN, Roetta Sessions, MD, 2 mg at 10/02/22 1247  LORazepam (ATIVAN) tablet 0.5 mg, 0.5 mg, Oral, Q6H  PRN, Chrystine Oiler, MD  normal saline 0.9 % oral rinse 30 mL, 30 mL, Oral, 4x DAILY PRN, William Hamburger, MD  ondansetron (PF) [facility]) injection 8 mg, 8 mg, IV Push, Q12H, Al-Musa, Amer, MD, 8 mg at 10/03/22 0521  piperacillin-tazobactam (ZOSYN) 4.5 g in sodium chloride 0.9% 100 mL (mini-bag plus), 4.5 g, Intravenous, Q6H, William Hamburger, MD, Last Rate: 33.3 mL/hr at 10/03/22 0139, 4.5 g at 10/03/22 0139  potassium chloride in sterile water 20 mEq/100 mL IVPB 20 mEq, 20 mEq, Intravenous, Q1H PRN, Magdalene River, MD, Last Rate: 100 mL/hr at 10/02/22 0936, 20 mEq at 10/02/22 0936  potassium chloride in sterile water 20 mEq/50 mL IVPB 20 mEq, 20 mEq, Intravenous, Q1H PRN, Magdalene River, MD  prochlorperazine edisylate (COMPAZINE) injection 10 mg, 10 mg, IV Push, Q6H PRN, Chrystine Oiler, MD, 10 mg at 09/25/22 1056  sodium chloride 0.9 % (new bag) bolus 500 mL, 500 mL, Intravenous, Once PRN, Chrystine Oiler, MD  sodium chloride 0.9 % flush 10 mL, 10 mL, IV Push, PRN for [facility], Chrystine Oiler, MD, 20 mL at 09/30/22 0939  sodium chloride 0.9 % flush 3 mL, 3 mL, IV Push, Q8H, Al-Musa, Amer, MD, 3 mL at 09/30/22 2111  sodium chloride 0.9 % flush 3 mL, 3 mL, IV Push, PRN for [facility], Roetta Sessions, MD  sodium chloride 0.9% infusion, 5 mL/hr, Intravenous, PRN, Chrystine Oiler, MD  tacrolimus (PROGRAF) immediate release capsule 6 mg, 6 mg, Oral, Q12H (0600-1800), Rall, Kerri A, PA, 6 mg at 10/03/22 5366  ursodioL (URSO 250) tablet 500 mg, 500 mg, Oral, BID, Chrystine Oiler, MD, 500 mg at 10/02/22 2101Objective: Temp:  [97.3 ?F (36.3 ?C)-97.8 ?F (36.6 ?C)] 97.6 ?F (36.4 ?C)Pulse:  [52-66] 62Resp:  [17-19] 19BP: (124-178)/(64-77) 124/64SpO2:  [95 %-98 %] 95 %I/O's:Gross Totals (Last 24 hours) at 10/03/2022 0654Last data filed at 10/02/2022 0930Intake 297 ml Output 0 ml Net 297 ml Wt: 10/02/22 103.5 kg 09/16/22 104.2 kg 09/13/22 104.3 kg 09/11/22 105.9 kg 09/09/22 105.4 kg 09/04/22 106.4 kg Physical ExamConstitutional:     Appearance: Normal appearance. He is normal weight. He is not ill-appearing or diaphoretic. HENT:    Head: Normocephalic.    Nose: Nose normal.    Mouth/Throat:    Mouth: Mucous membranes are moist.    Pharynx: Oropharynx is clear. No oropharyngeal exudate. Eyes:    General: No scleral icterus.      Right eye: No discharge.       Left eye: No discharge. Cardiovascular:    Rate and Rhythm: Normal rate and regular rhythm.    Heart sounds: Normal heart sounds. No murmur heard.Pulmonary:    Effort: Pulmonary effort is normal. No respiratory distress.    Breath sounds: Normal breath sounds. No wheezing or rales. Abdominal:    General: Abdomen is flat. Bowel sounds are normal. There is no distension.    Palpations: Abdomen is soft.    Tenderness: There is no abdominal tenderness. There is no guarding. Musculoskeletal:       General: No swelling or deformity. Normal range of motion.    Right lower leg: No edema.    Left lower leg: No edema. Skin:   General: Skin is warm.    Findings: Rash (Mild erythema / edema of left eye lid; overall significantly improved; mild erythema over forehead) present.    Comments: Toes hyperpigmetned/?PVD - not newBruises, petechiae scattered Neurological:    Mental Status: He is alert and oriented to person, place, and time. Psychiatric:  Mood and Affect: Mood normal. Labs:Recent Results (from the past 24 hour(s)) Platelet count  Collection Time: 10/02/22  1:03 PM Result Value Ref Range  Platelets 22 (L) 150 - 420 x1000/?L Immature Platelet Fraction (BH GH LMW YH)  Collection Time: 10/02/22  1:03 PM Result Value Ref Range  Immature Platelet Fraction 0.8 (L) 1.2 - 8.6 %  Absolute Immature Platelet Fraction 0.2 <20.0 x1000/?L Lactate dehydrogenase  Collection Time: 10/03/22  5:30 AM Result Value Ref Range  LD 322 (H) 122 - 241 U/L Haptoglobin  Collection Time: 10/03/22  5:30 AM Result Value Ref Range  Haptoglobin 372 (H) 30 - 200 mg/dL PT/INR and PTT (BH GH L LMW YH)  Collection Time: 10/03/22  5:30 AM Result Value Ref Range  Prothrombin Time 13.8 (H) 9.6 - 12.3 seconds  INR 1.26 (H) 0.86 - 1.12  PTT 31.7 (H) 23.0 - 31.4 seconds Magnesium  Collection Time: 10/03/22  5:30 AM Result Value Ref Range  Magnesium 1.9 1.7 - 2.4 mg/dL Comprehensive metabolic panel  Collection Time: 10/03/22  5:30 AM Result Value Ref Range  Sodium 144 136 - 144 mmol/L  Potassium 3.8 3.3 - 5.3 mmol/L  Chloride 110 (H) 98 - 107 mmol/L  CO2 20 20 - 30 mmol/L  Anion Gap 14 7 - 17  Glucose 87 70 - 100 mg/dL  BUN 7 (L) 8 - 23 mg/dL  Creatinine 1.32 4.40 - 1.30 mg/dL  Calcium 8.6 (L) 8.8 - 10.2 mg/dL  BUN/Creatinine Ratio 9.1 8.0 - 23.0  Total Protein 6.4 5.9 - 8.3 g/dL  Albumin 3.1 (L) 3.6 - 5.1 g/dL  Total Bilirubin 0.5 <=1.0 mg/dL  Alkaline Phosphatase 82 9 - 122 U/L  Alanine Aminotransferase (ALT) 21 9 - 59 U/L  Aspartate Aminotransferase (AST) 22 10 - 35 U/L  Globulin 3.3 2.0 - 3.9 g/dL  A/G Ratio 0.9 (L) 1.0 - 2.2  AST/ALT Ratio 1.0 Reference Range Not Established  eGFR (Creatinine) >60 >=60 mL/min/1.53m2 Diagnostics:No results found.Assessment: QUANELL WINDOM is a 72 y.o. 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 He is s/p 2 cycles of chemotherapy with Decitabine + Venetoclax with reduction in blast count. 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  Plan:  Day +9: Continue supportive care. Topicals including clindaymycin and erythromycin for skin folliculitis and eye swelling/erythema. 1 unit plts. Post plt count ordered.  Increase tacrolimus last on 8/16 (dose 6mg  BID)  Conditioning/Transplant: (completed)-Melphalan 70 mg/m2 over 30 minutes on D-6, *Melphalan dosed reduced to 70 mg/m2 due to age-Fludarabine 40 mg/m2 over 30 minutes every 24hr for 4 doses from D-5 to D -2             -PT cyclophosphamide 25 mg/kg IV over 1.5 hrs every 24hr for 2 doses on D +3 & D +4 (reduced by 50% to 25 mg/kg due 10/10 match and age)-Mesna 50 mg/kg CIVI daily, for 2 doses, starting 15 minutes before cyclophosphamide on D +3 & D +4-Stem cell infusion day 0 (09/24/22) HEME: 1 unit plts.- Transfuse pRBCs to maintain Hb >7 g/dL, platelets >27 or if clinically indicated- Filgrastim 64mcg/kg starting on day +5 until stable engraftment of neutrophils.ID: Prophylactic antimicrobials: Acyclovir 400 mg PO TID (home medications),  Zosyn, Isavuconazole 372 mg po daily (prior authorization pending, voriconazole allergy-gingival hyperplasia), Pentamidine 300 mg inhalation monthly- CMV -/pending, attempt to get letermovir for CMV prophylaxis. Will  send script on Day +7 to determine insurance coverage before starting inpatient.Check CMV PCR weekly.- If febrile, panculture, UA and CXR, start broad spectrum empiric antibiotics if neutropenic or septic appearing.   - Repeat blood cx every 24 hours with temperature > 100.4 or septic appearing GVHD Prophylaxis:  - Tacrolimus 6mg  PO BID.  Increased from 5mg  BID, due to level of 3.2. Goal of 6-11ng/ml- PT cyclophosphamide 25 mg/kg IV over 1.5 hrs every 24hr for 2 doses on D +3 & D +4 with mesna daily on Day +3 and Day +4  (PTCy dose reduced by 50% )- Monitor levels, haptoglobin, LDH, triglycerides.- Daily skin exam, liver function tests twice weekly (minimum), document stool output/day. Fluids, Electrolytes and Nutrition:- Regular diet, consider nutrition consult if needed- Daily weights- Strict Intake and output- Replete electrolytes PRNEmesis prophylaxis: Ondansetron 16 mg PO every 12 hours w/ PRN Compazine, Dexamethasone 12mg  po premed prior to melphalan High dose cyclophosphamide: doxepin premed 25 mg and diphenhydramine 25 mg 30 mins before Cytoxan; D5W 1/2NS + KCL 20 meq + Magnesium 1 gm alternating with D5W + of NaCHO3 + KCL 20 meq until 24 hours after last Cyclophosphamide dose (hold fluid during Cytoxan infusion)Diarrhea: imodium 2mg  PRN 4 times daily, lomotil 2.5-0.025 4x daily PRN  VOD: - Continue Ursodiol 500mg  PO BID, continue until Day +90- Daily weights, strict fluid monitoring of all intake/output- Monitor liver function tests and coags at least twice weekly HTN: Increased Norvasc to 10mg  in the setting of SBP 150s-160s. Continue to monitor Patient reports not taking his antiHTN meds for the last few days due to feeling weak. PTA. 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 Evalyn Casco. Addendum to followAmin Lucille Crichlow, MDYale cancer centerHeme/onc fellow, PGY-6

## 2022-10-03 NOTE — Plan of Care
Plan of Care Overview/ Patient Status    0700 - 1900 Allo D9 Alert and oriented x4. Calm and cooperative with care during shift. Afebrile, otherwise VSS in room air. Right DL hickman, dressing c/d/I, flushing without difficulty, positive blood return. KVO antibiotics. LBM 8/16, voiding spontaneously. No c/o n/v/d. No c/o pain. Patient is independent. Call bell and personal belongings within reach. Turns and repositions independently q2h. Safety precautions maintained.

## 2022-10-03 NOTE — Plan of Care
Plan of Care Overview/ Patient Status    1900-0700Patient received. He is D+8 into D+9 of allo SCT. Patient is a&o x4, VSS, afebrile, O2 sat stable on RA. No c/o N/V/D/C. Pt reported no pain. Pt has RDL Hickman, dated 8/11, flushed w/o difficulty, positive blood return. Dressing CDI, infusing KVO w/ abx per MAR. Received no transfusions or repletions on this shift. Skin intact. Currently on a regular diet, tolerating well. Voiding spontaneously, w/o difficulty, into toilet. Last BM 8/16. Pt independent in bed, able to self-T/R q2h, OOB independently w/o assist or device. Currently resting in bed, alarms off and not indicated. Protective and universal precautions set and maintained. Care plan maintained throughout shift. Tax inspector. Problem: Adult Inpatient Plan of CareGoal: Plan of Care ReviewOutcome: Interventions implemented as appropriateFlowsheets (Taken 10/03/2022 0455)Progress: no changePlan of Care Reviewed With: patient spouse

## 2022-10-03 NOTE — Progress Notes
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 Negative for chills and fever. HENT: Negative.  Negative for sore throat.  Eyes: Negative.  Respiratory: Negative.  Negative for cough, sputum production, shortness of breath and wheezing.  Cardiovascular: Negative.  Negative for chest pain, palpitations and leg swelling. Gastrointestinal:  Positive for diarrhea and nausea. Negative for abdominal pain, constipation and vomiting. Genitourinary:  Negative for dysuria, frequency and urgency. Musculoskeletal: Negative.  Skin:  Positive for rash. Negative for itching.      Toes discolored - not new Neurological: Negative.  Psychiatric/Behavioral: Negative.   Review of Allergies/Meds/Hx: Allergies Allergen Reactions  Voriconazole Other (See Comments)   Gingival edema/redness making it difficult to bite down  Medications:Current Facility-Administered Medications:   acetaminophen (TYLENOL) tablet 650 mg, 650 mg, Oral, Q6H PRN, Flonnie Hailstone, [place], PA  acyclovir (ZOVIRAX) capsule 400 mg, 400 mg, Oral, Q8H, Chrystine Oiler, MD, 400 mg at 10/02/22 2100  albuterol neb sol 2.5 mg/3 mL (0.083%) (PROVENTIL,VENTOLIN), 2.5 mg, Nebulization, Q30 MIN PRN, Chrystine Oiler, MD  Melene Muller ON 10/03/2022] amLODIPine (NORVASC) tablet 10 mg, 10 mg, Oral, Daily, Rall, Kerri A, PA  chlorhexidine gluconate (PERIDEX) 0.12 % solution 15 mL, 15 mL, Mouth/Throat, BID, Al-Musa, Amer, MD, 15 mL at 10/02/22 2100  clindamycin (CLEOCIN T) 1 % external solution, , Topical (Top), BID, Rall, Kerri A, [place], Given at 10/02/22 2103  diphenhydrAMINE (BENADRYL) injection 50 mg, 50 mg, IV Push, Once PRN, Chrystine Oiler, MD  diphenoxylate-atropine (LOMOTIL) 2.5-0.025 mg per tablet 1 tablet, 1 tablet, Oral, 4x DAILY PRN, Roetta Sessions, MD, 1 tablet at 10/02/22 1731  EPINEPHrine (EPI-PEN) auto-injector 0.3 mg, 0.3 mg, Intramuscular, Q15 MIN PRN, Chrystine Oiler, MD  EPINEPHrine (EPI-PEN) auto-injector 0.3 mg, 0.3 mg, Intramuscular, Q15 MIN PRN, Chrystine Oiler, MD  erythromycin (ROMYCIN) 5 mg/gram (0.5 %) ophthalmic ointment, , Left Eye, Nightly, Al-Musa, Denyse Dago, MD, Given at 10/02/22 2101  famotidine (PF) (PEPCID) 20 mg in sodium chloride 0.9% PF 5 mL Injection, 20 mg, IV Push, Once PRN, Chrystine Oiler, MD  filgrastim-sndz (ZARXIO) syringe 480 mcg, 5 mcg/kg (Treatment Plan Recorded), Subcutaneous, Daily (1700), Chrystine Oiler, MD, 480 mcg at 10/02/22 1731  finasteride (PROSCAR) tablet 5 mg, 5 mg, Oral, Daily, Al-Musa, Amer, MD, 5 mg at 10/02/22 0815  furosemide (LASIX) injection 20 mg, 20 mg, Intravenous, Once, Al-Musa, Denyse Dago, MD  hydrocortisone sodium succinate (PF) (Solu-CORTEF) injection 100 mg, 100 mg, IV Push, Once PRN, Chrystine Oiler, MD  isavuconazonium (Cresemba) capsule Cap 372 mg, 372 mg, Oral, Daily, Chrystine Oiler, MD, 372 mg at 10/02/22 0815  latanoprost (XALATAN) 0.005 % ophthalmic solution 1 drop, 1 drop, Both Eyes, Nightly, Al-Musa, Amer, MD, 1 drop at 10/02/22 2101  loperamide (IMODIUM) capsule 2 mg, 2 mg, Oral, 4x DAILY PRN, Roetta Sessions, MD, 2 mg at 10/02/22 1247  LORazepam (ATIVAN) tablet 0.5 mg, 0.5 mg, Oral, Q6H PRN, Chrystine Oiler, MD  normal saline 0.9 % oral rinse 30 mL, 30 mL, Oral, 4x DAILY PRN, William Hamburger, MD  ondansetron (PF) (ZOFRAN) injection 8 mg, 8 mg, IV Push, Q12H, Roetta Sessions, MD, 8 mg  at 10/02/22 1735  piperacillin-tazobactam (ZOSYN) 4.5 g in sodium chloride 0.9% 100 mL (mini-bag plus), 4.5 g, Intravenous, Q6H, William Hamburger, MD, Last Rate: 33.3 mL/hr at 10/02/22 1734, 4.5 g at 10/02/22 1734  potassium chloride in sterile water 20 mEq/100 mL IVPB 20 mEq, 20 mEq, Intravenous, Q1H PRN, Magdalene River, MD, Last Rate: 100 mL/hr at 10/02/22 0936, 20 mEq at 10/02/22 0936  potassium chloride in sterile water 20 mEq/50 mL IVPB 20 mEq, 20 mEq, Intravenous, Q1H PRN, Magdalene River, MD  prochlorperazine edisylate (COMPAZINE) injection 10 mg, 10 mg, IV Push, Q6H PRN, Chrystine Oiler, MD, 10 mg at 09/25/22 1056  sodium chloride 0.9 % (new bag) bolus 500 mL, 500 mL, Intravenous, Once PRN, Chrystine Oiler, MD  sodium chloride 0.9 % flush 10 mL, 10 mL, IV Push, PRN for [facility], Chrystine Oiler, MD, 20 mL at 09/30/22 0939  sodium chloride 0.9 % flush 3 mL, 3 mL, IV Push, Q8H, Al-Musa, Amer, MD, 3 mL at 09/30/22 2111  sodium chloride 0.9 % flush 3 mL, 3 mL, IV Push, PRN for [facility], Roetta Sessions, MD  sodium chloride 0.9% infusion, 5 mL/hr, Intravenous, PRN, Chrystine Oiler, MD  tacrolimus (PROGRAF) immediate release capsule 6 mg, 6 mg, Oral, Q12H (0600-1800), Rall, Kerri A, PA, 6 mg at 10/02/22 1731  ursodioL (URSO 250) tablet 500 mg, 500 mg, Oral, BID, Chrystine Oiler, MD, 500 mg at 10/02/22 2101Objective: Temp:  [97.3 ?F (36.3 ?C)-98 ?F (36.7 ?C)] 97.8 ?F (36.6 ?C)Pulse:  [50-66] 66Resp:  [17-20] 18BP: (136-178)/(70-77) 136/73SpO2:  [96 %-98 %] 96 %I/O's:Gross Totals (Last 24 hours) at 10/02/2022 2255Last data filed at 10/02/2022 0930Intake 297 ml Output 700 ml Net -403 ml Wt: 10/02/22 103.5 kg 09/16/22 104.2 kg 09/13/22 104.3 kg 09/11/22 105.9 kg 09/09/22 105.4 kg 09/04/22 106.4 kg Physical ExamConstitutional:     Appearance: Normal appearance. He is normal weight. He is not ill-appearing or diaphoretic. HENT:    Head: Normocephalic.    Nose: Nose normal.    Mouth/Throat:    Mouth: Mucous membranes are moist.    Pharynx: Oropharynx is clear. No oropharyngeal exudate. Eyes:    General: No scleral icterus.      Right eye: No discharge.       Left eye: No discharge. Cardiovascular:    Rate and Rhythm: Normal rate and regular rhythm.    Heart sounds: Normal heart sounds. No murmur heard.Pulmonary:    Effort: Pulmonary effort is normal. No respiratory distress.    Breath sounds: Normal breath sounds. No wheezing or rales. Abdominal:    General: Abdomen is flat. Bowel sounds are normal. There is no distension.    Palpations: Abdomen is soft.    Tenderness: There is no abdominal tenderness. There is no guarding. Musculoskeletal:       General: No swelling or deformity. Normal range of motion.    Right lower leg: No edema.    Left lower leg: No edema. Skin:   General: Skin is warm.    Findings: Rash (left eyelid swollen with ?purpura, erythema over forehead less pronounced compared to yesterday) present.    Comments: Toes hyperpigmetned/?PVD - not newBruises, petechiae scattered Neurological:    Mental Status: He is alert and oriented to person, place, and time. Psychiatric:       Mood and Affect: Mood normal. Labs:Recent Results (from the past 24 hour(s)) Tacrolimus level  Collection Time: 10/02/22  5:56 AM Result Value Ref Range  Tacrolimus Lvl 3.2 (L) See Comment ng/mL CBC auto differential  Collection Time: 10/02/22  5:56 AM Result Value Ref Range  WBC <0.1 (L) 4.0 - 11.0 x1000/?L  RBC 2.62 (L) 4.00 - 6.00 M/?L  Hemoglobin 7.7 (L) 13.2 - 17.1 g/dL  Hematocrit 64.33 (L) 29.51 - 50.00 %  MCV 83.2 80.0 - 100.0 fL  MCH 29.4 27.0 - 33.0 pg  MCHC 35.3 31.0 - 36.0 g/dL  RDW-CV 88.4 16.6 - 06.3 %  Platelets 10 (LL) 150 - 420 x1000/?L  MPV    Neutrophils    Lymphocytes    Monocytes    Eosinophils Basophil    Immature Granulocytes    nRBC 0.0 0.0 - 1.0 %  Absolute Lymphocyte Count    Monocyte Absolute Count    Eosinophil Absolute Count    Basophil Absolute Count    Absolute Immature Granulocyte Count    Absolute nRBC 0.00 0.00 - 1.00 x 1000/?L  ANC (Abs Neutrophil Count) 0.01 (L) 2.00 - 7.60 x 1000/?L Comprehensive metabolic panel  Collection Time: 10/02/22  5:56 AM Result Value Ref Range  Sodium 140 136 - 144 mmol/L  Potassium 3.6 3.3 - 5.3 mmol/L  Chloride 108 (H) 98 - 107 mmol/L  CO2 22 20 - 30 mmol/L  Anion Gap 10 7 - 17  Glucose 90 70 - 100 mg/dL  BUN 6 (L) 8 - 23 mg/dL  Creatinine 0.16 0.10 - 1.30 mg/dL  Calcium 8.6 (L) 8.8 - 10.2 mg/dL  BUN/Creatinine Ratio 7.5 (L) 8.0 - 23.0  Total Protein 6.3 5.9 - 8.3 g/dL  Albumin 3.2 (L) 3.6 - 5.1 g/dL  Total Bilirubin 0.6 <=9.3 mg/dL  Alkaline Phosphatase 83 9 - 122 U/L  Alanine Aminotransferase (ALT) 18 9 - 59 U/L  Aspartate Aminotransferase (AST) 25 10 - 35 U/L  Globulin 3.1 2.0 - 3.9 g/dL  A/G Ratio 1.0 1.0 - 2.2  AST/ALT Ratio 1.4 Reference Range Not Established  eGFR (Creatinine) >60 >=60 mL/min/1.2m2 Immature Platelet Fraction (BH GH LMW YH)  Collection Time: 10/02/22  5:56 AM Result Value Ref Range  Immature Platelet Fraction 0.4 (L) 1.2 - 8.6 %  Absolute Immature Platelet Fraction 0.0 <20.0 x1000/?L Platelet count  Collection Time: 10/02/22  1:03 PM Result Value Ref Range  Platelets 22 (L) 150 - 420 x1000/?L Immature Platelet Fraction (BH GH LMW YH)  Collection Time: 10/02/22  1:03 PM Result Value Ref Range  Immature Platelet Fraction 0.8 (L) 1.2 - 8.6 %  Absolute Immature Platelet Fraction 0.2 <20.0 x1000/?L Recent Lab Values     09/27/2022 09/28/2022 09/29/2022 09/30/2022 10/01/2022    6:15 PM  5:04 AM  4:55 AM  5:29 AM  6:20 AM  WBC 0.2 0.1 <0.1 <0.1 <0.1  Hemoglobin 6.9 7.4 7.6 7.1 7.8  Platelet 11 7 12 11 8   ANC 0.06 0.02 0.01  0.01  AST -- 20 20 21 24   ALT -- 19 12 13 19   Tacrolimus -- -- -- 0.9 2.2  Serum Creatinine -- 0.81 0.96 0.78 0.72  Bilirubin -- 0.6 0.6 0.7 0.7     Comment for Platelet at  5:04 AM on 09/28/2022: Not called, previous critical value reported to LIP in last 5 days.  Comment for Platelet at  5:29 AM on 09/30/2022: Platelet count verified by smear.  Comment for Platelet at  6:20 AM on 10/01/2022: Not called, previous critical value reported to LIP in last 5 days.  Comment for ANC at  5:29 AM on 09/30/2022: Not measured  Comment for ALT at  5:04 AM on 09/28/2022: Calcium dobesilate can  cause artificially low ALT results at therapeutic concentrations  Comment for ALT at  4:55 AM on 09/29/2022: Calcium dobesilate can cause artificially low ALT results at therapeutic concentrations  Comment for ALT at  5:29 AM on 09/30/2022: Calcium dobesilate can cause artificially low ALT results at therapeutic concentrations  Comment for ALT at  6:20 AM on 10/01/2022: Calcium dobesilate can cause artificially low ALT results at therapeutic concentrations  Comment for Tacrolimus at  5:29 AM on 09/30/2022: As of 01/05/18, this assay is being performed on the Roche Cobas 8000 system from LC/MS/MS. If a direct comparison of a prior sample (tested by previous method) and the current sample is desired, please contact the Chemistry laboratory for additional testing.Reference range applies to trough level only. Testing performed by automated clinical chemistry analyzers. Therapeutic Reference Range (Trough): 5.0 - 20 ng/mL  Comment for Tacrolimus at  6:20 AM on 10/01/2022: As of 01/05/18, this assay is being performed on the Roche Cobas 8000 system from LC/MS/MS. If a direct comparison of a prior sample (tested by previous method) and the current sample is desired, please contact the Chemistry laboratory for additional testing.Reference range applies to trough level only. Testing performed by automated clinical chemistry analyzers. Therapeutic Reference Range (Trough): 5.0 - 20 ng/mL  Diagnostics:No results found.Assessment: Douglas [name] is a 72 y.o. 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 He is s/p 2 cycles of chemotherapy with Decitabine + Venetoclax with reduction in blast count. 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  Plan:  Day +8: Continue supportive careTopical antibioticsPRN antimotilityHydrate and transfuse Conditioning/Transplant: (completed)-Melphalan 70 mg/m2 over 30 minutes on D-6, *Melphalan dosed reduced to 70 mg/m2 due to age-Fludarabine 40 mg/m2 over 30 minutes every 24hr for 4 doses from D-5 to D -2             -PT cyclophosphamide 25 mg/kg IV over 1.5 hrs every 24hr for 2 doses on D +3 & D +4 (reduced by 50% to 25 mg/kg due 10/10 match and age)-Mesna 50 mg/kg CIVI daily, for 2 doses, starting 15 minutes before cyclophosphamide on D +3 & D +4-Stem cell infusion day 0 (09/24/22) HEME: chemo associated pancytopenia- Transfuse pRBCs if  Hb < 7 g/dL, platelets < 10 or if clinically indicated- Filgrastim 25mcg/kg starting on day +5 until stable engraftment of neutrophils.ID: Prophylactic antimicrobials: Acyclovir 400 mg PO TID (home medications),  Zosyn, Isavuconazole 372 mg po daily (prior authorization pending, voriconazole allergy-gingival hyperplasia), Pentamidine 300 mg inhalation monthlyTopical erythromycin- CMV -/pending, attempt to get letermovir for CMV prophylaxis. Will send script on Day +7 to determine insurance coverage before starting inpatient.Check CMV PCR weekly.- If febrile, panculture, UA and CXR, start broad spectrum empiric antibiotics if neutropenic or septic appearing.   - Repeat blood cx every 24 hours with temperature > 100.4 or septic appearing GVHD Prophylaxis:  - Tacrolimus  3mg  PO starting day +5.  Goal of 6-11ng/ml- PT cyclophosphamide 25 mg/kg IV over 1.5 hrs every 24hr for 2 doses on D +3 & D +4 with mesna daily on Day +3 and Day +4  (PTCy dose reduced by 50% )- Monitor levels, haptoglobin, LDH, triglycerides.- Daily skin exam, liver function tests twice weekly (minimum), document stool output/day. Fluids, Electrolytes and Nutrition:- Regular diet, consider nutrition consult if needed- Daily weights- Strict Intake and output- Replete electrolytes PRNEmesis prophylaxis: Ondansetron 16 mg PO  every 12 hours w/ PRN Compazine, Dexamethasone 12mg  po premed prior to melphalan High dose cyclophosphamide: doxepin premed 25 mg and diphenhydramine 25 mg 30 mins before Cytoxan; D5W 1/2NS + KCL 20 meq + Magnesium 1 gm alternating with D5W + of NaCHO3 + KCL 20 meq until 24 hours after last Cyclophosphamide dose (hold fluid during Cytoxan infusion)Diarrhea: imodium 2mg  PRN 4 times daily, lomotil 2.5-0.025 4x daily PRN  VOD: - Continue Ursodiol 500mg  PO BID, continue until Day +90- Daily weights, strict fluid monitoring of all intake/output- Monitor liver function tests and coags at least twice weekly HTNPatient reports not taking his antiHTN meds for the last few days due to feeling weak. 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

## 2022-10-04 LAB — CBC WITH AUTO DIFFERENTIAL
BKR WAM ABSOLUTE NRBC (2 DEC): 0 x 1000/ÂµL (ref 0.00–1.00)
BKR WAM ANC (ABSOLUTE NEUTROPHIL COUNT): 0.01 x 1000/ÂµL — ABNORMAL LOW (ref 2.00–7.60)
BKR WAM HEMATOCRIT (2 DEC): 21.2 % — ABNORMAL LOW (ref 38.50–50.00)
BKR WAM HEMOGLOBIN: 7.3 g/dL — ABNORMAL LOW (ref 13.2–17.1)
BKR WAM MCH (PG): 28.6 pg (ref 27.0–33.0)
BKR WAM MCHC: 34.4 g/dL (ref 31.0–36.0)
BKR WAM MCV: 83.1 fL (ref 80.0–100.0)
BKR WAM NUCLEATED RED BLOOD CELLS: 0 % (ref 0.0–1.0)
BKR WAM PLATELETS: 6 x1000/ÂµL — CL (ref 150–420)
BKR WAM RDW-CV: 14.4 % (ref 11.0–15.0)
BKR WAM RED BLOOD CELL COUNT.: 2.55 M/ÂµL — ABNORMAL LOW (ref 4.00–6.00)
BKR WAM WHITE BLOOD CELL COUNT: <0.1 x1000/ÂµL — ABNORMAL LOW (ref 4.0–11.0)

## 2022-10-04 LAB — COMPREHENSIVE METABOLIC PANEL
BKR A/G RATIO: 1 (ref 1.0–2.2)
BKR ALANINE AMINOTRANSFERASE (ALT): 17 U/L (ref 9–59)
BKR ALBUMIN: 3.2 g/dL — ABNORMAL LOW (ref 3.6–5.1)
BKR ALKALINE PHOSPHATASE: 79 U/L (ref 9–122)
BKR ANION GAP: 12 (ref 7–17)
BKR ASPARTATE AMINOTRANSFERASE (AST): 22 U/L (ref 10–35)
BKR AST/ALT RATIO: 1.3
BKR BILIRUBIN TOTAL: 0.6 mg/dL (ref <=1.2)
BKR BLOOD UREA NITROGEN: 5 mg/dL — ABNORMAL LOW (ref 8–23)
BKR BUN / CREAT RATIO: 6.3 — ABNORMAL LOW (ref 8.0–23.0)
BKR CALCIUM: 8.5 mg/dL — ABNORMAL LOW (ref 8.8–10.2)
BKR CHLORIDE: 109 mmol/L — ABNORMAL HIGH (ref 98–107)
BKR CO2: 20 mmol/L (ref 20–30)
BKR CREATININE: 0.8 mg/dL (ref 0.40–1.30)
BKR EGFR, CREATININE (CKD-EPI 2021): >60 mL/min/{1.73_m2} (ref >=60)
BKR GLOBULIN: 3.1 g/dL (ref 2.0–3.9)
BKR GLUCOSE: 92 mg/dL (ref 70–100)
BKR POTASSIUM: 3.7 mmol/L (ref 3.3–5.3)
BKR PROTEIN TOTAL: 6.3 g/dL (ref 5.9–8.3)
BKR SODIUM: 141 mmol/L (ref 136–144)

## 2022-10-04 LAB — TACROLIMUS LEVEL     (BH GH L LMW YH): BKR TACROLIMUS BLOOD: 5.7 ng/mL

## 2022-10-04 LAB — IMMATURE PLATELET FRACTION (BH GH LMW YH)
BKR WAM IPF, ABSOLUTE: 0.2 x1000/ÂµL (ref <20.0)
BKR WAM IPF: 3.1 % (ref 1.2–8.6)

## 2022-10-04 LAB — NT-PROBNPE: BKR B-TYPE NATRIURETIC PEPTIDE, PRO (PROBNP): 573 pg/mL — ABNORMAL HIGH (ref <125.0)

## 2022-10-04 MED ORDER — ZZ IMS TEMPLATE
Freq: Two times a day (BID) | ORAL | Status: DC
Start: 2022-10-04 — End: 2022-10-11
  Administered 2022-10-04 – 2022-10-10 (×12): 0.5 mg via ORAL

## 2022-10-04 NOTE — Plan of Care
Plan of Care Overview/ Patient Status    0700 - 1900 Allo D10 Alert and oriented x4. Calm and cooperative with care during shift. Afebrile, otherwise VSS in room air. Right DL hickman, dressing change per protocol, dressing now c/d/I, flushing without difficulty, positive blood return. KVO antibiotics. Administered plts without premedications. Administered potassium per MAR. LBM 8/18, voiding spontaneously. No c/o n/v/d. No c/o pain. Patient is independent. Call bell and personal belongings within reach. Turns and repositions independently q2h. Safety precautions maintained.

## 2022-10-04 NOTE — Progress Notes
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 He presented in 07/2021 to establish hematologic malignancies care given that his daughter lives in [place]. He resides in [place] and is under the care of Dr. Sofie Rower of [facility] in [place]. He established care with Dr. Mel Almond 05/14/21. He has been under observation for the CMML. Around February, his Hb was found to be 5 for which he received 2U of PRBC. He had had no viral syndrome prior. 3-4 weeks prior he had sustained a bad fall in which his LLE became significantly swollen. He was to have a Vesper LLE which did not occur. There is a question of whether he could have bled into his leg. At the time he became progressively fatigued and SOB. He had a cardiac evaluation including a pharmacologic stress test which was reported to him as normal. He did not have endoscopies; last colonoscopy was in 2021. He was found to be iron deficient and his Hb responded to IV iron rising to 12.2 in 12/2020. A bone marrow biopsy and aspiration was repeat 05/16/21 and reported to be stable. 07/08/21 he saw Dr. Mendel Corning at [facility] who recommended deferring allogeneic SCT until the time of progression and HMA until the time of critical cytopenias.At baseline he is chronically tired, sleeps very poorly; has been recommended to have sleep test.He is not limited in his activities; walks once a day for 20 minutes.In [place] he was bicycling and walking up to 3-4X/day.Dr. 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.He was offered admission at [facility] and decided to come to [place] to be treated near his family.Review of CBC in care everywhere shows that WBC count trended down to 1.29, ANC to 0.31 with Hb 12.4 on the same date and platelets 121, 01/16/22.The following CBC on 04/07/22 showed WBC 1.4, ANC 0.75, Hb 5.9, MCV 101, Platelets 383 for which he was transfused 2U of PRBC on 04/09/22 and 1U of PRBC on 04/24/22.The anemia w/u on 04/07/22 showed ferritin 15, epo level 632, TSH 4.2, B12 1342, folate >20, retic 6.29%, LDH 257(ULN 246).The development of severe bicytopenia triggered the bone marrow biopsy and aspiration 04/28/22.He was subsequently started on venofer and had 5-6 infusions of 200 mg.He did not have endoscopies which were set up for him out of concern that his post-nasal gtt would make them difficult to tolerate.He had Hickman catheter placed on 06/10/22. [place] guided marrow 06/15/22 showed CMML with increased blasts 10-15% with focal areas of 20-25%Started C1D1 Dec-Ven 06/16/22 Post-C1 marrow showed decrease in blasts to <5%; normal FISH MDS panel Internal History:Cycle 2 of DEC5 and venetoclax (14 days) on 6/4, complicated by oral pain Evaluated by [name] on 6/10; two potential MUD in registry Follow up with [name] on 7/24 for transplant planning and tentative admission for reduced intensity conditioning and matched unrelated donor in August Post cycle 2 bone marrow biopsy 09/07/22 showed hypocellular marrow (<5% cellularity), <5 blast, and flow cytometry with few blast and no evidence of myelodysplasia. KT, FISH and NGS are pendingToday, he presents for review of bone marrow biopsy. Fatigue is at baseline -  taking naps during the day but still performing daily activities without limitation. Gums remain inflamed and tender but oral ulcers have healed. Able to eat and swallow without issues. Evaluated by dentist and pharmacy with thought of voriconazole reaction.  Pathology:09/07/22: BONE MARROW, BIOPSY, CLOT SECTION AND ASPIRATE:      - HYPOCELLULAR MARROW WITH NO INCREASE IN BLASTS (see note) NOTE: The findings are of a markedly hypocellular marrow with no increase in blasts. Correlation with clinical, cytogenetic, and laboratory findings is recommended. Cytogenetics pending 07/14/2022 BONE MARROW, BIOPSY, CLOT SECTION AND ASPIRATE:           - HYPERCELLULAR MARROW WITH MARKED MEGAKARYOCYTIC HYPERPLASIA (see Note) NOTE: The morphologic findings are most consistent with continued involvement by patient's known myeloid neoplasm. CD34-positive blasts are decreased compared to the patient's prior biopsy (estimated to be <5% of cellularity by immunohistochemical studies). Please correlate with molecular studies. KT: 46,XY[20] Male karyotype with no evidence of clonal abnormality in bone marrow cellsFISH: Normal FISH results for the 5q, 7q, 8q (MYC), 20q, ABL1 and BCR loci in bone marrow cells. NPM1: not detected 06/15/22: BONE MARROW, BIOPSY, CLOT SECTION AND ASPIRATE:      - MYELOID NEOPLASM WITH INCREASED BLASTS (see note) NOTE: Patient's history of CMML is noted. CD34 shows 10-15% blasts overall, consistent with CMML-2. However, focally the blasts are up to 20-25%, worrisome for progression to AML. Please correlate with pending molecular/cytogenetic studies Normal FISH results for the 5q, 7q, 20q, RUNX1T1 (8q), RUNX1, ABL1, BCR, NUP98, KMT2A, MYH11 and CBFB loci in bone marrow cells. 46, XYHeme [facility]CS PATHOLOGIST INTERPRETATION: Pathogenic variants are identified in [name], ASXL1, and RUNX1, as well as variants of possible pathogenic significance in CEBPA and STAG2 (3 variants). Level 1 - Variants of Diagnostic, Prognostic, or Therapeutic Significance Gene name:[name] Variant Locations:chr17:[id number] Variant (protein):P95H (p.Pro95His) Variant (coding DNA):c.284C>A ([id number]) Variant Type:Missense Allelic Fraction/Coverage:41% (330.0) Significance:This variant is of known pathologic significance in myeloid neoplasms. Gene name:ASXL1 Variant Locations:chr20:[id number] Variant (protein):G675fs (p.Gly646TrpfsTer12) Variant (coding DNA):c.1934dup ([id number]) Variant Type:Frameshift Allelic Fraction/Coverage:37% (830.0) Significance:Pathogenic frameshift variant frequently reported in heme malignancies. Gene name:RUNX1 Variant Locations:chr21:36231773 Variant (protein):R204Q (p.Arg204Gln) Variant (coding DNA):c.611G>A ([id number]) Variant Type:Missense Allelic Fraction/Coverage:30% (906.0) Significance:Pathogenic missense variant frequently reported in heme malignancies. Level 2 - Variants with Unknown Clinical Significance, May be Related to Disease Gene name:STAG2 Variant Locations:chrX:[id number] Variant (protein):Y636Ter (p.Tyr636Ter) Variant (coding DNA):c.1908C>A ([id number]) Variant Type:Stop gained Allelic Fraction/Coverage:59% (276.0) Significance:Likely pathogenic nonsense variant that has not been reported in heme malignancies. Gene name:STAG2 Variant Locations:chrX:[id number] Variant (protein):L53fs (p.Leu502ValfsTer7) Variant (coding DNA):c.1500dup ([id number]) Variant Type:Frameshift Allelic Fraction/Coverage:14% (192.0) Significance:Novel frameshift variant of likely pathogenic significance. Gene name:CEBPA Variant Locations:chr19:33793123 Variant (protein):Y71fs (p.Tyr67LeufsTer41) Variant (coding DNA):c.198dup ([id number]) Variant Type:Frameshift Allelic Fraction/Coverage:9% (446.0) Significance:Frameshift variant in CEBPA that does not fall within the bZIP domain and has been rarely reported in heme malignancies. Gene name:STAG2 Variant Locations:chrX:[id number] Variant (protein):R110Ter (p.Arg110Ter) Variant (coding DNA):c.328C>T ([id number]) Variant Type:Stop gained Allelic Fraction/Coverage:3% (233.0) Significance:A nonsense variant it detected, which has rarely been reported in the literature. Level 3 - Variants with Unknown Clinical Significance, Unlikely Related to Disease Gene name:FLT3 Variant Locations:chr13:28578283 Variant (protein):S963L (p.Ser963Leu) Variant (coding DNA):c.2888C>T ([id number]) Variant Type:Missense Allelic Fraction/Coverage:50% (518.0) Gene name:ATM Variant Locations:chr11:108160350 Variant (protein):L1420F (p.Leu1420Phe) Variant (coding DNA):c.4258C>T ([id number]) Variant Type:Missense Allelic Fraction/Coverage:48% (691.0) Gene name:TET2 Variant Locations:chr4:106190882 Variant (protein):N1387S (p.Asn1387Ser) Variant (coding DNA):c.4160A>G ([id number]) Variant Type:Missense Allelic Fraction/Coverage:41% (620.0) Gene name:TET2 Variant Locations:chr4:106180878 Variant (protein):R1302S (p.Arg1302Ser) Variant (coding DNA):c.3906A>T ([id number]) Variant Type:Missense Allelic Fraction/Coverage:38% (645.0 	04/28/22 BONE MARROW, ASPIRATE SMEARS, CLOT SECTION AND CORE BIOPSY:  Hypercellular bone marrow (80%) with involvement by a chronic myelodysplastic/myeloproliferative neoplasm, compatible with CMML-2.              Significantly increased myeloid blasts (SEE COMMENTS)              No lymphoproliferative disorder or plasma cell neoplasm identified.              Decreased storage iron; no ringed sideroblasts identified.               Mild bone marrow reticulin fibrosis.Flow cytometric analysis identified significantly increased CD34+ myeloid blasts (9.7%). No monoclonal B-cell population, monoclonal plasma cell population or aberrant T-cell expression was seen (see separate report for details). Comment: ...there are significantly increased CD34+ myeloid blasts within the bone marrow averaging 10-15% in most areas, with one focal area with significantly more blasts which are starting to aggregate in the 20%+ range. These latter findings are very concerning for evolving Acute Myeloid Leukemia. 46,XY[20]ASXL1 p.Gly646TrpfsX12 VAF 32%[name] p.Pro95His VAF 50%STAG2 p.ONG295MWUXLK4 VAF 33%STAG2 MWN0272Z VAF 54%RUNX1 p.Arg204GIn VAF 25% Tier IITET2 p.Asn1387Ser VAF 49% Tier II3/31/2023 Bone marrow biopsyMyeloid neoplsm without excess blasts50%cellular marrow with dysmegakaryopoiesis and monocytic hyperplasiaAcellular aspirateNo excess fibrosis on reticulin stainingFlow without increased blasts; increased monocytes with normal phenotype46XX [?]ASXL1, TET2 and [name] mutated2021 marrow results unavailablePMH:?CLLHypertensionHyperlipidemiaGlaucomaBasal cell carcinoma, foreheadProstate hypertrophyDepressionAnxietyMedications: acyclovir,levoFLOXacin,voriconazole,amLODIPine,aspirin,atorvastatin,chlorhexidine gluconate,cholecalciferol,coenzyme Q10,dexAMETHasone,finasteride,Cresemba,latanoprost,lidocaine,lisinopriL,magnesium oxide,metoprolol succinate XL,ondansetron No Known Allergies FH: No history of hematologic malignanciesFather - deceased at 33 of complications of metastatic prostate cancerMother - decease in her 45s of metastatic breast cancerSister/Nancy - breast cancer in her 30s, 66Sister - in good health, 63Daughter - type I DMDaughter - good healthSH: He lives with his wife. He retired from [employer] in 2009. Moved from [facility] to [place] area in 2023. Never smoker. 1-2 beers a week. Bicycling and gardening as hobbies.His daughter lives in [place]. Physical Exam:Vitals: reviewed General: Tired-appearing man in NAD.HEENT: anicteric sclera, MMM, OP clearCV: RRR, nl s1/s2, no m/r/gLungs: CTA BLAbdomen: nondistended, obese, soft, no splenomegaly Extremities: wwp, no edemaNeurologic: A&OX3, grossly non-focalSkin: petechia over toes and hyperpigmentation Labs:Lab Results Component Value Date  WBC 0.5 (L) 09/11/2022  HGB 8.4 (L) 09/11/2022  HCT 24.70 (L) 09/11/2022  MCV 82.6 09/11/2022  PLT 14 (L) 09/11/2022    Chemistry    Component Value Date/Time  NA 136 09/11/2022 1231  K 4.2 09/11/2022 1231  CL 104 09/11/2022 1231  CO2 21 09/11/2022 1231  BUN 12 09/11/2022 1231  CREATININE 0.97 09/11/2022 1231  GLU 89 09/11/2022 1231  PROT 7.8 09/11/2022 1231    Component Value Date/Time  CALCIUM 9.4 09/11/2022 1231  ALKPHOS 104 09/11/2022 1231  AST 16 09/11/2022 1231  ALT 19 09/11/2022 1231  BILITOT 0.7 09/11/2022 1231  BILITOT 0.7 09/11/2022 1231  ALBUMIN 3.8 09/11/2022 1231   Assessment and Plan:Douglas [name] is a 72 year old man with a history of depression, anxiety, iron deficiency anemia, CLL, CMML (diagnosed 12/2019) who developed mild pancytopenia in 01/2022 followed by severe anemia with Hb 5 iso of iron deficiency and persistent leukopenia/neutropenia 04/07/22 now with marrow done in [facility] under the care of Dr. 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 	Heme CCS with level 1 [name], ASXL1, RUNX1 as well as variants of possible pathogenic significance in CEBPA and STAG2 (3 variants). NPM1 PCR neg, FISH panel within normal- FLT3-TKD/ITD PCR from PB negative 05/29/22. - Post C1 marrow with  no increased blasts (1% on aspirate)- HLA sent and resulted 05/30/22, CMV IgG negative- Post C2 marrow with <5% cells of IHC. FC with normal blast, no evidence of dysplasia Treatment- Plan is pending results of w/u with options ranging from intensive induction chemotherapy such as with Vyxeos vs. Hma+/- venetoclax vs treatment on a clinical trial such as in the event of NPM1 mutation, MLL-rearrangement.- Consented for Decitabine/venetoclax which is currently the plan for induction in the absence of an NPM1 mutation, MLL-rearrangement or FLT3 mutation- C1D1 06/16/22 Dec-Ven - C2D1 07/21/22 - Dec-Ven 14 days - Tentative admission for allogeneic HSCT in August 2nd 2024 Monitoring- Biweekly labs with CBC w/ diff, CMP, TLS and DIC labs for now.- TLS ppx: no longer on allopurinol Supportive care- Hb goal >=8, Plts >=20 - Hickman care#Stomatitis Gingival edema and tenderness related to voriconazole (2% side effect of stomatitis). -- Prescribe Cresemba, pending authorization will try IV Micafungin # Anti-microbial ProphylaxisHSV/VZV: Acylovir 400 mg PO bid (prior dose adjusted; eprescribed)Bacterial: Fluoroquinolone for ANC<500 --> Will decrease ppx to 500 mg daily (eprescribed)Fungal: broad azole for ANC<500 --> hold voriconazole, will prescribe Cresemba and try for Micafungin weekly infusion # CLL- 04/28/22 BM aspirate flow cytometry did not detect a monoclonal B cell population# Iron deficiency anemia- s/p IV iron ~1g March 2024- urgent EGD/colonoscopy referral placed at the time of initial consultation but endoscopy appts have coincided with nadir and now ablated marrow and thus been deferred particularly given no s/s of bleeding despite stress of induction# AnxietyManaged with non-pharmacologic methods.Summary of Plan:Discontinue voriconazole due to stomatitis, start Cresemba prior authorization, IV Micafungin Start oral dexamethasone swish and spit, oral chlorhexidine solution; continue viscous lidocaine 2%Review with [name] plan for transplant admission on 09/18/22 Continue acyclovir and levofloxacin ppx Patient discussed with Dr. Cindee Lame, MD Hematology Oncology Fellow PGY-5Electronically Signed by Simone Curia, MD, September 11, 2022 [facility] saw and examined the patient and was present during the key portion of the E/M service. I agree with the history and exam as documented by Dr. Taylor Mill Shi Of Service: 7/26/24Mr. 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 Discussed marrow results with [name]; consensus to proceed with transplant. Secondary, monocytic with high risk of transient response to HMA/ven therefore will not wait for potential count recovery. Since the time of this visit, [facility]CS heme results have become available and report:A pathogenic [name] variant is identified. Compared to the prior sample from 06/15/22, the previously identified RUNX1, CEBPA, and STAG2 are below the limit of quantitation and the ASXL1, and 2 additional STAG2 variants are not detected. Iso hypocellular marrow this is hard to interpret, but it certainly does not provide evidence of clonal evolution.This last cycle has been notable for oral pain, gum hypertrophyand with the marrow being NED, this is not as we feared 2/2 to disease. Voriconazole apparently can have this as an AE. We are switching to IV anidulafungin as a bridge to allo, pursuing cresemba PA and do a short course of oral dex for palliation. Follow-up as needed post allo-SCT.Otherwise, agree with assessment and plan outlined above. Douglas Emerald, MD PhDHematology AttendingOn the day of this patient's encounter, a total of 20 minutes was personally spent by me.  This does not include any resident/fellow teaching time, or any time spent performing a procedural service.

## 2022-10-04 NOTE — Plan of Care
Plan of Care Overview/ Patient Status    1900-0700Patient received. He is D+9 into D+10 of allo SCT. Patient is a&o x4, VSS, afebrile, O2 sat stable on RA. No c/o N/V/D/C. Pt reported no pain. Pt has RDL Hickman, dated 8/11 (per pt request, hickman drsg to be changed during day, oncoming RN aware), flushed w/o difficulty, positive blood return. Dressing CDI, infusing KVO w/ abx per MAR. Received no transfusions or repletions on this shift. Skin intact. Currently on a regular diet, tolerating well. Voiding spontaneously, w/o difficulty, into toilet. Last BM 8/17. Pt independent in bed, able to self-T/R q2h, OOB independently w/o assist or device. Currently resting in bed, alarms off and not indicated. Protective and universal precautions set and maintained. Care plan maintained throughout shift.  Tax inspector.  Problem: Adult Inpatient Plan of CareGoal: Plan of Care ReviewOutcome: Interventions implemented as appropriateFlowsheets (Taken 10/04/2022 0208)Progress: no changePlan of Care Reviewed With: patient spouse

## 2022-10-05 LAB — CBC WITH AUTO DIFFERENTIAL
BKR WAM ABSOLUTE NRBC (2 DEC): 0 x 1000/ÂµL (ref 0.00–1.00)
BKR WAM ABSOLUTE NRBC (2 DEC): 0.06 x 1000/ÂµL (ref 0.00–1.00)
BKR WAM ANC (ABSOLUTE NEUTROPHIL COUNT): 0.01 x 1000/ÂµL — ABNORMAL LOW (ref 2.00–7.60)
BKR WAM ANC (ABSOLUTE NEUTROPHIL COUNT): 0.05 x 1000/ÂµL — ABNORMAL LOW (ref 2.00–7.60)
BKR WAM HEMATOCRIT (2 DEC): 21.9 % — ABNORMAL LOW (ref 38.50–50.00)
BKR WAM HEMATOCRIT (2 DEC): 22.2 % — ABNORMAL LOW (ref 38.50–50.00)
BKR WAM HEMOGLOBIN: 7.5 g/dL — ABNORMAL LOW (ref 13.2–17.1)
BKR WAM HEMOGLOBIN: 7.8 g/dL — ABNORMAL LOW (ref 13.2–17.1)
BKR WAM MCH (PG): 28.5 pg (ref 27.0–33.0)
BKR WAM MCH (PG): 29 pg (ref 27.0–33.0)
BKR WAM MCHC: 34.2 g/dL (ref 31.0–36.0)
BKR WAM MCHC: 35.1 g/dL (ref 31.0–36.0)
BKR WAM MCV: 83.3 fL (ref 80.0–100.0)
BKR WAM MCV: 82.5 fL (ref 80.0–100.0)
BKR WAM NUCLEATED RED BLOOD CELLS: 0 % (ref 0.0–1.0)
BKR WAM NUCLEATED RED BLOOD CELLS: 33.3 % — ABNORMAL HIGH (ref 0.0–1.0)
BKR WAM PLATELETS: 10 x1000/ÂµL — CL (ref 150–420)
BKR WAM PLATELETS: 15 x1000/ÂµL — ABNORMAL LOW (ref 150–420)
BKR WAM RDW-CV: 14.4 % (ref 11.0–15.0)
BKR WAM RDW-CV: 14.6 % (ref 11.0–15.0)
BKR WAM RED BLOOD CELL COUNT.: 2.63 M/ÂµL — ABNORMAL LOW (ref 4.00–6.00)
BKR WAM RED BLOOD CELL COUNT.: 2.69 M/ÂµL — ABNORMAL LOW (ref 4.00–6.00)
BKR WAM WHITE BLOOD CELL COUNT: 0.1 x1000/ÂµL — ABNORMAL LOW (ref 4.0–11.0)
BKR WAM WHITE BLOOD CELL COUNT: 0.2 x1000/ÂµL — ABNORMAL LOW (ref 4.0–11.0)

## 2022-10-05 LAB — COMPREHENSIVE METABOLIC PANEL
BKR A/G RATIO: 1.1 (ref 1.0–2.2)
BKR ALANINE AMINOTRANSFERASE (ALT): 23 U/L (ref 9–59)
BKR ALBUMIN: 3.4 g/dL — ABNORMAL LOW (ref 3.6–5.1)
BKR ALKALINE PHOSPHATASE: 84 U/L (ref 9–122)
BKR ANION GAP: 12 (ref 7–17)
BKR ASPARTATE AMINOTRANSFERASE (AST): 24 U/L (ref 10–35)
BKR AST/ALT RATIO: 1
BKR BILIRUBIN TOTAL: 0.7 mg/dL (ref <=1.2)
BKR BLOOD UREA NITROGEN: 6 mg/dL — ABNORMAL LOW (ref 8–23)
BKR BUN / CREAT RATIO: 7.2 — ABNORMAL LOW (ref 8.0–23.0)
BKR CALCIUM: 8.8 mg/dL (ref 8.8–10.2)
BKR CHLORIDE: 109 mmol/L — ABNORMAL HIGH (ref 98–107)
BKR CO2: 19 mmol/L — ABNORMAL LOW (ref 20–30)
BKR CREATININE: 0.83 mg/dL (ref 0.40–1.30)
BKR EGFR, CREATININE (CKD-EPI 2021): >60 mL/min/{1.73_m2} (ref >=60)
BKR GLOBULIN: 3 g/dL (ref 2.0–3.9)
BKR GLUCOSE: 99 mg/dL (ref 70–100)
BKR POTASSIUM: 3.7 mmol/L (ref 3.3–5.3)
BKR PROTEIN TOTAL: 6.4 g/dL (ref 5.9–8.3)
BKR SODIUM: 140 mmol/L (ref 136–144)

## 2022-10-05 LAB — IMMATURE PLATELET FRACTION (BH GH LMW YH)
BKR WAM IPF, ABSOLUTE: 1.1 x1000/ÂµL (ref <20.0)
BKR WAM IPF, ABSOLUTE: 1.3 x1000/ÂµL (ref <20.0)
BKR WAM IPF: 10.7 % — ABNORMAL HIGH (ref 1.2–8.6)
BKR WAM IPF: 8.6 % (ref 1.2–8.6)

## 2022-10-05 LAB — TACROLIMUS LEVEL     (BH GH L LMW YH): BKR TACROLIMUS BLOOD: 7.2 ng/mL

## 2022-10-05 LAB — MAGNESIUM: BKR MAGNESIUM: 1.5 mg/dL — ABNORMAL LOW (ref 1.7–2.4)

## 2022-10-05 NOTE — Progress Notes
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 Will transfuse 1 bag of PLTSubjective: Douglas [name] states he is feeling improved. His  forehead rash less tender today and overall he feels better. Some diarrhea and nausea on occasion, but seems better today.  Appetite betterEating most of his meals. Review of Systems Constitutional:  Positive for malaise/fatigue. Negative for chills and fever. HENT: Negative.  Negative for sore throat.  Eyes: Negative.  Respiratory: Negative.  Negative for cough, sputum production, shortness of breath and wheezing.  Cardiovascular: Negative.  Negative for chest pain, palpitations and leg swelling. Gastrointestinal:  Positive for diarrhea and nausea. Negative for abdominal pain, constipation and vomiting. Genitourinary:  Negative for dysuria, frequency and urgency. Musculoskeletal: Negative.  Skin:  Positive for rash. Negative for itching.      Toes discolored - not new Neurological: Negative.  Psychiatric/Behavioral: Negative.   Review of Allergies/Meds/Hx: Allergies Allergen Reactions  Voriconazole Other (See Comments)   Gingival edema/redness making it difficult to bite down  Medications:Current Facility-Administered Medications:   acetaminophen (TYLENOL) tablet 650 mg, 650 mg, Oral, Q6H PRN, Shen, Meifeng, PA  acyclovir (ZOVIRAX) capsule 400 mg, 400 mg, Oral, Q8H, Chrystine Oiler, MD, 400 mg at 10/04/22 0526 albuterol neb sol 2.5 mg/3 mL (0.083%) (PROVENTIL,VENTOLIN), 2.5 mg, Nebulization, Q30 MIN PRN, Chrystine Oiler, MD  amLODIPine (NORVASC) tablet 10 mg, 10 mg, Oral, Daily, Rall, Kerri A, PA, 10 mg at 10/03/22 0837  chlorhexidine gluconate (PERIDEX) 0.12 % solution 15 mL, 15 mL, Mouth/Throat, BID, Al-Musa, Amer, MD, 15 mL at 10/03/22 2017  clindamycin (CLEOCIN T) 1 % external solution, , Topical (Top), BID, Rall, Kerri A, [place], Given at 10/03/22 2019  diphenhydrAMINE (BENADRYL) injection 50 mg, 50 mg, IV Push, Once PRN, Chrystine Oiler, MD  diphenoxylate-atropine (LOMOTIL) 2.5-0.025 mg per tablet 1 tablet, 1 tablet, Oral, 4x DAILY PRN, Roetta Sessions, MD, 1 tablet at 10/02/22 1731  EPINEPHrine (EPI-PEN) auto-injector 0.3 mg, 0.3 mg, Intramuscular, Q15 MIN PRN, Chrystine Oiler, MD  EPINEPHrine (EPI-PEN) auto-injector 0.3 mg, 0.3 mg, Intramuscular, Q15 MIN PRN, Chrystine Oiler, MD  erythromycin (ROMYCIN) 5 mg/gram (0.5 %) ophthalmic ointment, , Left Eye, Nightly, Al-Musa, Denyse Dago, MD, Given at 10/03/22 2018  famotidine (PF) (PEPCID) 20 mg in sodium chloride 0.9% PF 5 mL Injection, 20 mg, IV Push, Once PRN, Chrystine Oiler, MD  filgrastim-sndz (ZARXIO) syringe 480 mcg, 5 mcg/kg (Treatment Plan Recorded), Subcutaneous, Daily (1700), Chrystine Oiler, MD, 480 mcg at 10/03/22 1723  finasteride (PROSCAR) tablet 5 mg, 5 mg, Oral, Daily, Al-Musa, Amer, MD, 5 mg at 10/03/22 0837  furosemide (LASIX) injection 20 mg, 20 mg, Intravenous, Once, Al-Musa, Denyse Dago, MD  hydrocortisone sodium succinate (PF) (Solu-CORTEF) injection 100 mg, 100 mg, IV Push, Once PRN, Chrystine Oiler, MD  isavuconazonium (Cresemba) capsule Cap 372 mg, 372 mg, Oral, Daily, Chrystine Oiler, MD, 372 mg at 10/03/22 0837  latanoprost (XALATAN) 0.005 % ophthalmic solution 1 drop, 1 drop, Both Eyes, Nightly, Al-Musa, Amer, MD, 1 drop at 10/03/22 2019  loperamide (IMODIUM) capsule 2 mg, 2 mg, Oral, 4x DAILY PRN, Roetta Sessions, MD, 2 mg at 10/02/22 1247  LORazepam (ATIVAN) tablet 0.5 mg, 0.5  mg, Oral, Q6H PRN, Chrystine Oiler, MD  normal saline 0.9 % oral rinse 30 mL, 30 mL, Oral, 4x DAILY PRN, William Hamburger, MD  ondansetron (PF) [facility]) injection 8 mg, 8 mg, IV Push, Q12H, Al-Musa, Amer, MD, 8 mg at 10/04/22 0542  piperacillin-tazobactam (ZOSYN) 4.5 g in sodium chloride 0.9% 100 mL (mini-bag plus), 4.5 g, Intravenous, Q6H, William Hamburger, MD, Last Rate: 33.3 mL/hr at 10/04/22 0140, 4.5 g at 10/04/22 0140  potassium chloride in sterile water 20 mEq/100 mL IVPB 20 mEq, 20 mEq, Intravenous, Q1H PRN, Magdalene River, MD, Last Rate: 100 mL/hr at 10/02/22 0936, 20 mEq at 10/02/22 0936  potassium chloride in sterile water 20 mEq/50 mL IVPB 20 mEq, 20 mEq, Intravenous, Q1H PRN, Magdalene River, MD  prochlorperazine edisylate (COMPAZINE) injection 10 mg, 10 mg, IV Push, Q6H PRN, Chrystine Oiler, MD, 10 mg at 09/25/22 1056  sodium chloride 0.9 % (new bag) bolus 500 mL, 500 mL, Intravenous, Once PRN, Chrystine Oiler, MD  sodium chloride 0.9 % flush 10 mL, 10 mL, IV Push, PRN for [facility], Chrystine Oiler, MD, 20 mL at 09/30/22 0939  sodium chloride 0.9 % flush 3 mL, 3 mL, IV Push, Q8H, Al-Musa, Amer, MD, 3 mL at 09/30/22 2111  sodium chloride 0.9 % flush 3 mL, 3 mL, IV Push, PRN for [facility], Roetta Sessions, MD  sodium chloride 0.9% infusion, 5 mL/hr, Intravenous, PRN, Chrystine Oiler, MD  tacrolimus (PROGRAF) immediate release capsule 6 mg, 6 mg, Oral, Q12H (0600-1800), Rall, Kerri A, PA, 6 mg at 10/04/22 5643  ursodioL (URSO 250) tablet 500 mg, 500 mg, Oral, BID, Chrystine Oiler, MD, 500 mg at 10/03/22 2018Objective: Temp:  [97.3 ?F (36.3 ?C)-98 ?F (36.7 ?C)] 97.9 ?F (36.6 ?C)Pulse:  [55-68] 57Resp:  [18-20] 18BP: (130-150)/(67-78) 150/78SpO2:  [96 %-98 %] 96 %I/O's:Gross Totals (Last 24 hours) at 10/04/2022 0639Last data filed at 10/03/2022 2023Intake -- Output 900 ml Net -900 ml Wt: 10/04/22 103.1 kg 09/16/22 104.2 kg 09/13/22 104.3 kg 09/11/22 105.9 kg 09/09/22 105.4 kg 09/04/22 106.4 kg Physical ExamConstitutional:     Appearance: Normal appearance. He is normal weight. He is not ill-appearing or diaphoretic. HENT:    Head: Normocephalic.    Nose: Nose normal.    Mouth/Throat:    Mouth: Mucous membranes are moist.    Pharynx: Oropharynx is clear. No oropharyngeal exudate. Eyes:    General: No scleral icterus.      Right eye: No discharge.       Left eye: No discharge. Cardiovascular:    Rate and Rhythm: Normal rate and regular rhythm.    Heart sounds: Normal heart sounds. No murmur heard.Pulmonary:    Effort: Pulmonary effort is normal. No respiratory distress.    Breath sounds: Normal breath sounds. No wheezing or rales. Abdominal:    General: Abdomen is flat. Bowel sounds are normal. There is no distension.    Palpations: Abdomen is soft.    Tenderness: There is no abdominal tenderness. There is no guarding. Musculoskeletal:       General: No swelling or deformity. Normal range of motion.    Right lower leg: No edema.    Left lower leg: No edema. Skin:   General: Skin is warm.    Findings: Rash (Mild erythema / edema of left eye lid; overall significantly improved; mild erythema over forehead) present.    Comments: Toes hyperpigmetned/?PVD - not newBruises, petechiae scattered Neurological:    Mental Status: He is alert and oriented to person, place, and time. Psychiatric:  Mood and Affect: Mood normal. Labs:Recent Results (from the past 24 hour(s)) NT-proBrain natriuretic peptide  Collection Time: 10/04/22  5:25 AM Result Value Ref Range  NT-proBNP 573.0 (H) <125.0 pg/mL CBC auto differential  Collection Time: 10/04/22  5:25 AM Result Value Ref Range  WBC <0.1 (L) 4.0 - 11.0 x1000/?L  RBC 2.55 (L) 4.00 - 6.00 M/?L  Hemoglobin 7.3 (L) 13.2 - 17.1 g/dL  Hematocrit 84.13 (L) 24.40 - 50.00 %  MCV 83.1 80.0 - 100.0 fL  MCH 28.6 27.0 - 33.0 pg  MCHC 34.4 31.0 - 36.0 g/dL  RDW-CV 10.2 72.5 - 36.6 %  Platelets 6 (LL) 150 - 420 x1000/?L  MPV    Neutrophils    Lymphocytes    Monocytes    Eosinophils    Basophil    Immature Granulocytes    nRBC 0.0 0.0 - 1.0 %  Absolute Lymphocyte Count    Monocyte Absolute Count    Eosinophil Absolute Count    Basophil Absolute Count    Absolute Immature Granulocyte Count    Absolute nRBC 0.00 0.00 - 1.00 x 1000/?L  ANC (Abs Neutrophil Count) 0.01 (L) 2.00 - 7.60 x 1000/?L Comprehensive metabolic panel  Collection Time: 10/04/22  5:25 AM Result Value Ref Range  Sodium 141 136 - 144 mmol/L  Potassium 3.7 3.3 - 5.3 mmol/L  Chloride 109 (H) 98 - 107 mmol/L  CO2 20 20 - 30 mmol/L  Anion Gap 12 7 - 17  Glucose 92 70 - 100 mg/dL  BUN 5 (L) 8 - 23 mg/dL  Creatinine 4.40 3.47 - 1.30 mg/dL  Calcium 8.5 (L) 8.8 - 10.2 mg/dL  BUN/Creatinine Ratio 6.3 (L) 8.0 - 23.0  Total Protein 6.3 5.9 - 8.3 g/dL  Albumin 3.2 (L) 3.6 - 5.1 g/dL  Total Bilirubin 0.6 <=4.2 mg/dL  Alkaline Phosphatase 79 9 - 122 U/L  Alanine Aminotransferase (ALT) 17 9 - 59 U/L  Aspartate Aminotransferase (AST) 22 10 - 35 U/L  Globulin 3.1 2.0 - 3.9 g/dL  A/G Ratio 1.0 1.0 - 2.2  AST/ALT Ratio 1.3 Reference Range Not Established  eGFR (Creatinine) >60 >=60 mL/min/1.56m2 Immature Platelet Fraction (BH GH LMW YH)  Collection Time: 10/04/22  5:25 AM Result Value Ref Range  Immature Platelet Fraction 3.1 1.2 - 8.6 %  Absolute Immature Platelet Fraction 0.2 <20.0 x1000/?L Diagnostics:No results found.Assessment: Douglas [name] is a 72 y.o. 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 He is s/p 2 cycles of chemotherapy with Decitabine + Venetoclax with reduction in blast count. 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  Plan:  Day +10: Continue supportive care. Topicals including clindaymycin and erythromycin for skin folliculitis and eye swelling/erythema. 1 unit plts. Post plt count ordered.  Increase tacrolimus today to 6.5mg  BID Conditioning/Transplant: (completed)-Melphalan 70 mg/m2 over 30 minutes on D-6, *Melphalan dosed reduced to 70 mg/m2 due to age-Fludarabine 40 mg/m2 over 30 minutes every 24hr for 4 doses from D-5 to D -2             -PT cyclophosphamide 25 mg/kg IV over 1.5 hrs every 24hr for 2 doses on D +3 & D +4 (reduced by 50% to 25 mg/kg due 10/10 match and age)-Mesna 50 mg/kg CIVI daily, for 2 doses, starting 15 minutes before cyclophosphamide on D +3 & D +4-Stem cell infusion day 0 (09/24/22) HEME: 1 unit  plts.- Transfuse pRBCs to maintain Hb >7 g/dL, platelets >86 or if clinically indicated- Filgrastim 35mcg/kg starting on day +5 until stable engraftment of neutrophils.ID: Prophylactic antimicrobials: Acyclovir 400 mg PO TID (home medications),  Zosyn, Isavuconazole 372 mg po daily (prior authorization pending, voriconazole allergy-gingival hyperplasia), Pentamidine 300 mg inhalation monthly- CMV -/pending, attempt to get letermovir for CMV prophylaxis. Will send script on Day +7 to determine insurance coverage before starting inpatient.Check CMV PCR weekly.- If febrile, panculture, UA and CXR, start broad spectrum empiric antibiotics if neutropenic or septic appearing.   - Repeat blood cx every 24 hours with temperature > 100.4 or septic appearing GVHD Prophylaxis:  - Tacrolimus 6mg  PO BID.  Increase to 6.5 BID, now 5.9. Goal of 6-11ng/ml- PT cyclophosphamide 25 mg/kg IV over 1.5 hrs every 24hr for 2 doses on D +3 & D +4 with mesna daily on Day +3 and Day +4  (PTCy dose reduced by 50% )- Monitor levels, haptoglobin, LDH, triglycerides.- Daily skin exam, liver function tests twice weekly (minimum), document stool output/day. Fluids, Electrolytes and Nutrition:- Regular diet, consider nutrition consult if needed- Daily weights- Strict Intake and output- Replete electrolytes PRNEmesis prophylaxis: Ondansetron 16 mg PO every 12 hours w/ PRN Compazine, Dexamethasone 12mg  po premed prior to melphalan High dose cyclophosphamide: doxepin premed 25 mg and diphenhydramine 25 mg 30 mins before Cytoxan; D5W 1/2NS + KCL 20 meq + Magnesium 1 gm alternating with D5W + of NaCHO3 + KCL 20 meq until 24 hours after last Cyclophosphamide dose (hold fluid during Cytoxan infusion)Diarrhea: imodium 2mg  PRN 4 times daily, lomotil 2.5-0.025 4x daily PRN  VOD: - Continue Ursodiol 500mg  PO BID, continue until Day +90- Daily weights, strict fluid monitoring of all intake/output- Monitor liver function tests and coags at least twice weekly HTN: Increased Norvasc to 10mg  in the setting of SBP 150s-160s. Continue to monitor Patient reports not taking his antiHTN meds for the last few days due to feeling weak. PTA. 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 Evalyn Casco. Addendum to followAmin Malon Siddall, MDYale cancer centerHeme/onc fellow, PGY-6

## 2022-10-05 NOTE — Plan of Care
Plan of Care Overview/ Patient Status    1900-0700Patient received. He is D+10 into D+11 of allo SCT. Patient is a&o x4, VSS, afebrile, O2 sat stable on RA. No c/o V/D/C, endorsed nausea, PRN Compazine given 2x w/ positive effect. Pt reported no pain. Pt has RDL [place], dated 8/18, flushed w/o difficulty, positive blood return. Dressing CDI, infusing KVO w/ abx per MAR. Received no transfusions or repletions on this shift. Skin intact. Currently on a regular diet, tolerating well. Voiding spontaneously, w/o difficulty, into toilet. Last BM 8/17. Pt independent in bed, able to self-T/R q2h, OOB independently w/o assist or device. Currently resting in bed, alarms off and not indicated. Protective and universal precautions set and maintained. Care plan maintained throughout shift.  Tax inspector. Problem: Adult Inpatient Plan of CareGoal: Plan of Care ReviewOutcome: Interventions implemented as appropriateFlowsheets (Taken 10/05/2022 0311)Progress: no changePlan of Care Reviewed With: patient

## 2022-10-05 NOTE — Progress Notes
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 Just remains fatigued. His  forehead rash less tender today and overall he feels better. Some diarrhea and nausea on occasion, but seems better today. He is taking anti-emetics.  Appetite better Eating most of his meals. Review of Systems Constitutional:  Positive for malaise/fatigue. Negative for chills and fever. HENT: Negative.  Negative for sore throat.  Eyes: Negative.  Respiratory: Negative.  Negative for cough, sputum production, shortness of breath and wheezing.  Cardiovascular: Negative.  Negative for chest pain, palpitations and leg swelling. Gastrointestinal:  Positive for diarrhea and nausea. Negative for abdominal pain, constipation and vomiting. Genitourinary:  Negative for dysuria, frequency and urgency. Musculoskeletal: Negative.  Skin:  Positive for rash. Negative for itching.      Toes discolored - not new Neurological: Negative.  Psychiatric/Behavioral: Negative.   Review of Allergies/Meds/Hx: Allergies Allergen Reactions  Voriconazole Other (See Comments)   Gingival edema/redness making it difficult to bite down  Medications:Current Facility-Administered Medications:   acetaminophen (TYLENOL) tablet 650 mg, 650 mg, Oral, Q6H PRN, Shen, Meifeng, PA  acyclovir (ZOVIRAX) capsule 400 mg, 400 mg, Oral, Q8H, Chrystine Oiler, MD, 400 mg at 10/05/22 0611  albuterol neb sol 2.5 mg/3 mL (0.083%) (PROVENTIL,VENTOLIN), 2.5 mg, Nebulization, Q30 MIN PRN, Chrystine Oiler, MD  amLODIPine (NORVASC) tablet 10 mg, 10 mg, Oral, Daily, Azari Hasler A, PA, 10 mg at 10/05/22 0910  chlorhexidine gluconate (PERIDEX) 0.12 % solution 15 mL, 15 mL, Mouth/Throat, BID, Al-Musa, Amer, MD, 15 mL at 10/05/22 0911  clindamycin (CLEOCIN T) 1 % external solution, , Topical (Top), BID, Georganne Siple A, [place], Given at 10/05/22 0916  diphenhydrAMINE (BENADRYL) injection 50 mg, 50 mg, IV Push, Once PRN, Chrystine Oiler, MD  diphenoxylate-atropine (LOMOTIL) 2.5-0.025 mg per tablet 1 tablet, 1 tablet, Oral, 4x DAILY PRN, Roetta Sessions, MD, 1 tablet at 10/02/22 1731  EPINEPHrine (EPI-PEN) auto-injector 0.3 mg, 0.3 mg, Intramuscular, Q15 MIN PRN, Chrystine Oiler, MD  EPINEPHrine (EPI-PEN) auto-injector 0.3 mg, 0.3 mg, Intramuscular, Q15 MIN PRN, Chrystine Oiler, MD  erythromycin (ROMYCIN) 5 mg/gram (0.5 %) ophthalmic ointment, , Left Eye, Nightly, Al-Musa, Denyse Dago, MD, Self Administered at 10/04/22 2000  famotidine (PF) (PEPCID) 20 mg in sodium chloride 0.9% PF 5 mL Injection, 20 mg, IV Push, Once PRN, Chrystine Oiler, MD  filgrastim-sndz (ZARXIO) syringe 480 mcg, 5 mcg/kg (Treatment Plan Recorded), Subcutaneous, Daily (1700), Chrystine Oiler, MD, 480 mcg at 10/04/22 1721  finasteride (PROSCAR) tablet 5 mg, 5 mg, Oral, Daily, Al-Musa, Amer, MD, 5 mg at 10/05/22 0910  hydrocortisone sodium succinate (PF) (Solu-CORTEF) injection 100 mg, 100 mg, IV Push, Once PRN, Chrystine Oiler, MD  isavuconazonium (Cresemba) capsule Cap 372 mg, 372 mg, Oral, Daily, Chrystine Oiler, MD, 372 mg at 10/05/22 0930  latanoprost (XALATAN) 0.005 % ophthalmic solution 1 drop, 1 drop, Both Eyes, Nightly, Al-Musa, Amer, MD, 1 drop at 10/04/22 2001  loperamide (IMODIUM) capsule 2 mg, 2 mg, Oral, 4x DAILY PRN, Roetta Sessions, MD, 2 mg at 10/02/22 1247  LORazepam (ATIVAN) tablet 0.5 mg, 0.5 mg, Oral, Q6H PRN, Chrystine Oiler, MD  normal saline  0.9 % oral rinse 30 mL, 30 mL, Oral, 4x DAILY PRN, William Hamburger, MD  ondansetron (PF) [facility]) injection 8 mg, 8 mg, IV Push, Q12H, Al-Musa, Amer, MD, 8 mg at 10/05/22 0610  piperacillin-tazobactam (ZOSYN) 4.5 g in sodium chloride 0.9% 100 mL (mini-bag plus), 4.5 g, Intravenous, Q6H, William Hamburger, MD, Last Rate: 33.3 mL/hr at 10/05/22 0916, 4.5 g at 10/05/22 0916  potassium chloride in sterile water 20 mEq/100 mL IVPB 20 mEq, 20 mEq, Intravenous, Q1H PRN, Magdalene River, MD, Last Rate: 100 mL/hr at 10/04/22 1726, 20 mEq at 10/04/22 1726  potassium chloride in sterile water 20 mEq/50 mL IVPB 20 mEq, 20 mEq, Intravenous, Q1H PRN, Magdalene River, MD  prochlorperazine edisylate (COMPAZINE) injection 10 mg, 10 mg, IV Push, Q6H PRN, Chrystine Oiler, MD, 10 mg at 10/05/22 0930  sodium chloride 0.9 % (new bag) bolus 500 mL, 500 mL, Intravenous, Once PRN, Chrystine Oiler, MD  sodium chloride 0.9 % flush 10 mL, 10 mL, IV Push, PRN for [facility], Chrystine Oiler, MD, 20 mL at 09/30/22 0939  sodium chloride 0.9 % flush 3 mL, 3 mL, IV Push, Q8H, Al-Musa, Amer, MD, 3 mL at 09/30/22 2111  sodium chloride 0.9 % flush 3 mL, 3 mL, IV Push, PRN for [facility], Roetta Sessions, MD  sodium chloride 0.9% infusion, 5 mL/hr, Intravenous, PRN, Chrystine Oiler, MD  tacrolimus (PROGRAF) Immediate Release capsule 6.5 mg, 6.5 mg, Oral, Q12H (0600-1800), Nassar, Amin, MD, 6.5 mg at 10/05/22 1191  ursodioL (URSO 250) tablet 500 mg, 500 mg, Oral, BID, Chrystine Oiler, MD, 500 mg at 10/05/22 0910Objective: Temp:  [97.5 ?F (36.4 ?C)-98.6 ?F (37 ?C)] 98.5 ?F (36.9 ?C)Pulse:  [70-87] 87Resp:  [18] 18BP: (118-145)/(72-80) 130/74SpO2:  [96 %-98 %] 97 %I/O's:Gross Totals (Last 24 hours) at 10/05/2022 1153Last data filed at 10/05/2022 0811Intake -- Output 980 ml Net -980 ml Wt: 10/05/22 102.1 kg 09/16/22 104.2 kg 09/13/22 104.3 kg 09/11/22 105.9 kg 09/09/22 105.4 kg 09/04/22 106.4 kg Physical ExamConstitutional:     Appearance: Normal appearance. He is normal weight. He is not ill-appearing or diaphoretic. HENT:    Head: Normocephalic.    Nose: Nose normal.    Mouth/Throat:    Mouth: Mucous membranes are moist.    Pharynx: Oropharynx is clear. No oropharyngeal exudate. Eyes:    General: No scleral icterus.      Right eye: No discharge.       Left eye: No discharge. Cardiovascular:    Rate and Rhythm: Normal rate and regular rhythm.    Heart sounds: Normal heart sounds. No murmur heard.Pulmonary:    Effort: Pulmonary effort is normal. No respiratory distress.    Breath sounds: Normal breath sounds. No wheezing or rales. Abdominal:    General: Abdomen is flat. Bowel sounds are normal. There is no distension.    Palpations: Abdomen is soft.    Tenderness: There is no abdominal tenderness. There is no guarding. Musculoskeletal:       General: No swelling or deformity. Normal range of motion.    Right lower leg: No edema.    Left lower leg: No edema. Skin:   General: Skin is warm.    Findings: Rash (Mild erythema / edema of left eye lid; overall significantly improved; mild erythema over forehead) present.    Comments: Toes hyperpigmetned/?PVD - not newBruises, petechiae scattered Neurological:    Mental Status: He is alert and oriented to person, place, and time. Psychiatric:       Mood and Affect: Mood normal.  Labs:Recent Results (from the past 24 hour(s)) Magnesium  Collection Time: 10/05/22  6:10 AM Result Value Ref Range  Magnesium 1.5 (L) 1.7 - 2.4 mg/dL Tacrolimus level  Collection Time: 10/05/22  6:10 AM Result Value Ref Range  Tacrolimus Lvl 7.2 See Comment ng/mL CBC auto differential  Collection Time: 10/05/22  6:10 AM Result Value Ref Range  WBC 0.1 (L) 4.0 - 11.0 x1000/?L  RBC 2.63 (L) 4.00 - 6.00 M/?L  Hemoglobin 7.5 (L) 13.2 - 17.1 g/dL  Hematocrit 16.10 (L) 96.04 - 50.00 %  MCV 83.3 80.0 - 100.0 fL  MCH 28.5 27.0 - 33.0 pg  MCHC 34.2 31.0 - 36.0 g/dL  RDW-CV 54.0 98.1 - 19.1 %  Platelets 10 (LL) 150 - 420 x1000/?L  MPV    Neutrophils    Lymphocytes    Monocytes    Eosinophils    Basophil    Immature Granulocytes    nRBC 0.0 0.0 - 1.0 %  Absolute Lymphocyte Count    Monocyte Absolute Count    Eosinophil Absolute Count    Basophil Absolute Count    Absolute Immature Granulocyte Count    Absolute nRBC 0.00 0.00 - 1.00 x 1000/?L  ANC (Abs Neutrophil Count) 0.01 (L) 2.00 - 7.60 x 1000/?L Comprehensive metabolic panel  Collection Time: 10/05/22  6:10 AM Result Value Ref Range  Sodium 140 136 - 144 mmol/L  Potassium 3.7 3.3 - 5.3 mmol/L  Chloride 109 (H) 98 - 107 mmol/L  CO2 19 (L) 20 - 30 mmol/L  Anion Gap 12 7 - 17  Glucose 99 70 - 100 mg/dL  BUN 6 (L) 8 - 23 mg/dL  Creatinine 4.78 2.95 - 1.30 mg/dL  Calcium 8.8 8.8 - 62.1 mg/dL  BUN/Creatinine Ratio 7.2 (L) 8.0 - 23.0  Total Protein 6.4 5.9 - 8.3 g/dL  Albumin 3.4 (L) 3.6 - 5.1 g/dL  Total Bilirubin 0.7 <=3.0 mg/dL  Alkaline Phosphatase 84 9 - 122 U/L  Alanine Aminotransferase (ALT) 23 9 - 59 U/L  Aspartate Aminotransferase (AST) 24 10 - 35 U/L  Globulin 3.0 2.0 - 3.9 g/dL  A/G Ratio 1.1 1.0 - 2.2  AST/ALT Ratio 1.0 Reference Range Not Established  eGFR (Creatinine) >60 >=60 mL/min/1.68m2 Immature Platelet Fraction (BH GH LMW YH)  Collection Time: 10/05/22  6:10 AM Result Value Ref Range  Immature Platelet Fraction 10.7 (H) 1.2 - 8.6 %  Absolute Immature Platelet Fraction 1.1 <20.0 x1000/?L Diagnostics:No results found.Assessment: Douglas [name] is a 72 y.o. 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 He is s/p 2 cycles of chemotherapy with Decitabine + Venetoclax with reduction in blast count. 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  Plan:  Day +11: Continue supportive care. Topicals including clindaymycin and erythromycin for skin folliculitis and eye swelling/erythema. 1 unit plts. Post plt count ordered.  Increase tacrolimus today to 6.5mg  BID Conditioning/Transplant: (completed)-Melphalan 70 mg/m2 over 30 minutes on D-6, *Melphalan dosed reduced to 70 mg/m2 due to age-Fludarabine 40 mg/m2 over 30 minutes every 24hr for 4 doses from D-5 to D -2             -PT cyclophosphamide 25 mg/kg IV over 1.5 hrs every 24hr for 2 doses on D +3 & D +4 (reduced by 50% to 25 mg/kg due 10/10 match and age)-[place] 50 mg/kg CIVI daily, for 2 doses, starting 15 minutes  before cyclophosphamide on D +3 & D +4-Stem cell infusion day 0 (09/24/22) HEME: 1 unit plts. Post plt count. - Transfuse pRBCs to maintain Hb >7 g/dL, platelets >16 or if clinically indicated- Filgrastim 36mcg/kg starting on day +5 until stable engraftment of neutrophils.ID: Prophylactic antimicrobials: Acyclovir 400 mg PO TID (home medications),  Zosyn, Isavuconazole 372 mg po daily (prior authorization pending, voriconazole allergy-gingival hyperplasia), Pentamidine 300 mg inhalation monthly- CMV -/pending, attempt to get letermovir for CMV prophylaxis. Will send script on Day +7 to determine insurance coverage before starting inpatient.Check CMV PCR weekly.- If febrile, panculture, UA and CXR, start broad spectrum empiric antibiotics if neutropenic or septic appearing.   - Repeat blood cx every 24 hours with temperature > 100.4 or septic appearing GVHD Prophylaxis:  - Tacrolimus 6mg  PO BID.  Increase to 6.5 BID, level 7.2. - PT cyclophosphamide 25 mg/kg IV over 1.5 hrs every 24hr for 2 doses on D +3 & D +4 with mesna daily on Day +3 and Day +4  (PTCy dose reduced by 50% )- Monitor levels, haptoglobin, LDH, triglycerides.- Daily skin exam, liver function tests twice weekly (minimum), document stool output/day. Fluids, Electrolytes and Nutrition:- Regular diet, consider nutrition consult if needed- Daily weights- Strict Intake and output- Replete electrolytes PRNEmesis prophylaxis: Ondansetron 16 mg PO every 12 hours w/ PRN Compazine, Dexamethasone 12mg  po premed prior to melphalan High dose cyclophosphamide: doxepin premed 25 mg and diphenhydramine 25 mg 30 mins before Cytoxan; D5W 1/2NS + KCL 20 meq + Magnesium 1 gm alternating with D5W + of NaCHO3 + KCL 20 meq until 24 hours after last Cyclophosphamide dose (hold fluid during Cytoxan infusion)Diarrhea: imodium 2mg  PRN 4 times daily, lomotil 2.5-0.025 4x daily PRN  VOD: - Continue Ursodiol 500mg  PO BID, continue until Day +90- Daily weights, strict fluid monitoring of all intake/output- Monitor liver function tests and coags at least twice weekly HTN: Increased Norvasc to 10mg  in the setting of SBP 150s-160s. Continue to monitor. SBP: 120-150.Patient reports not taking his antiHTN meds for the last few days due to feeling weak PTA. 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 Elane Fritz Addendum to followKerri Fredderick Severance PA-C

## 2022-10-06 LAB — COMPREHENSIVE METABOLIC PANEL
BKR A/G RATIO: 1.1 (ref 1.0–2.2)
BKR ALANINE AMINOTRANSFERASE (ALT): 24 U/L (ref 9–59)
BKR ALBUMIN: 3.5 g/dL — ABNORMAL LOW (ref 3.6–5.1)
BKR ALKALINE PHOSPHATASE: 91 U/L (ref 9–122)
BKR ANION GAP: 14 (ref 7–17)
BKR ASPARTATE AMINOTRANSFERASE (AST): 27 U/L (ref 10–35)
BKR AST/ALT RATIO: 1.1
BKR BILIRUBIN TOTAL: 0.7 mg/dL (ref <=1.2)
BKR BLOOD UREA NITROGEN: 9 mg/dL (ref 8–23)
BKR BUN / CREAT RATIO: 10.2 (ref 8.0–23.0)
BKR CALCIUM: 9 mg/dL (ref 8.8–10.2)
BKR CHLORIDE: 108 mmol/L — ABNORMAL HIGH (ref 98–107)
BKR CO2: 18 mmol/L — ABNORMAL LOW (ref 20–30)
BKR CREATININE: 0.88 mg/dL (ref 0.40–1.30)
BKR EGFR, CREATININE (CKD-EPI 2021): >60 mL/min/{1.73_m2} (ref >=60)
BKR GLOBULIN: 3.1 g/dL (ref 2.0–3.9)
BKR GLUCOSE: 104 mg/dL — ABNORMAL HIGH (ref 70–100)
BKR POTASSIUM: 3.8 mmol/L (ref 3.3–5.3)
BKR PROTEIN TOTAL: 6.6 g/dL (ref 5.9–8.3)
BKR SODIUM: 140 mmol/L (ref 136–144)

## 2022-10-06 LAB — CBC WITH AUTO DIFFERENTIAL
BKR WAM ABSOLUTE NRBC (2 DEC): 0.12 x 1000/ÂµL (ref 0.00–1.00)
BKR WAM HEMATOCRIT (2 DEC): 22.5 % — ABNORMAL LOW (ref 38.50–50.00)
BKR WAM HEMOGLOBIN: 7.7 g/dL — ABNORMAL LOW (ref 13.2–17.1)
BKR WAM MCH (PG): 28.4 pg (ref 27.0–33.0)
BKR WAM MCHC: 34.2 g/dL (ref 31.0–36.0)
BKR WAM MCV: 83 fL (ref 80.0–100.0)
BKR WAM NUCLEATED RED BLOOD CELLS: 15 % — ABNORMAL HIGH (ref 0.0–1.0)
BKR WAM PLATELETS: 19 x1000/ÂµL — ABNORMAL LOW (ref 150–420)
BKR WAM RDW-CV: 14.6 % (ref 11.0–15.0)
BKR WAM RED BLOOD CELL COUNT.: 2.71 M/ÂµL — ABNORMAL LOW (ref 4.00–6.00)
BKR WAM WHITE BLOOD CELL COUNT: 0.8 x1000/ÂµL — ABNORMAL LOW (ref 4.0–11.0)

## 2022-10-06 LAB — MANUAL DIFFERENTIAL
BKR WAM BANDS (DIFF) (1 DEC): 12 % — ABNORMAL HIGH (ref 0.0–10.0)
BKR WAM BASOPHIL - ABS (DIFF) 2 DEC: 0 x 1000/ÂµL (ref 0.00–1.00)
BKR WAM BASOPHILS (DIFF): 0 % (ref 0.0–1.4)
BKR WAM EOSINOPHILS (DIFF) 2 DEC: 0 x 1000/ÂµL (ref 0.00–1.00)
BKR WAM EOSINOPHILS (DIFF): 0 % (ref 0.0–5.0)
BKR WAM LYMPHOCYTE - ABS (DIFF) 2 DEC: 0.03 x 1000/ÂµL — ABNORMAL LOW (ref 0.60–3.70)
BKR WAM LYMPHOCYTES (DIFF): 4 % — ABNORMAL LOW (ref 17.0–50.0)
BKR WAM METAMYELOCYTES (DIFF) 1 DEC: 8 % — ABNORMAL HIGH (ref 0.0–1.0)
BKR WAM MONOCYTE - ABS (DIFF) 2 DEC: 0.19 x 1000/ÂµL (ref 0.00–1.00)
BKR WAM MONOCYTES (DIFF): 24 % — ABNORMAL HIGH (ref 4.0–12.0)
BKR WAM MYELOCYTES (DIFF) 1 DEC: 4 % — ABNORMAL HIGH (ref 0.0–0.0)
BKR WAM NEUTROPHILS (DIFF): 40 % (ref 39.0–72.0)
BKR WAM NEUTROPHILS - ABS (DIFF) 2 DEC: 0.42 x 1000/ÂµL — ABNORMAL LOW (ref 2.00–7.60)
BKR WAM PROMYELOCYTES (DIFF) 1 DEC: 8 % — ABNORMAL HIGH (ref 0.0–0.0)

## 2022-10-06 LAB — IMMATURE PLATELET FRACTION (BH GH LMW YH)
BKR WAM IPF, ABSOLUTE: 2.8 x1000/ÂµL (ref <20.0)
BKR WAM IPF: 14.6 % — ABNORMAL HIGH (ref 1.2–8.6)

## 2022-10-06 LAB — TACROLIMUS LEVEL     (BH GH L LMW YH): BKR TACROLIMUS BLOOD: 6.2 ng/mL

## 2022-10-06 MED ORDER — MAGNESIUM SULFATE 2 GRAM/50 ML (4 %) IN WATER INTRAVENOUS PIGGYBACK
2 | INTRAVENOUS | Status: DC | PRN
Start: 2022-10-06 — End: 2022-10-11
  Administered 2022-10-08: 21:00:00 2 mL/h via INTRAVENOUS

## 2022-10-06 MED ORDER — MAGNESIUM SULFATE 2 GRAM/50 ML (4 %) IN WATER INTRAVENOUS PIGGYBACK
2504 gram/50 mL (4 %) | INTRAVENOUS | Status: DC | PRN
Start: 2022-10-06 — End: 2022-10-11
  Administered 2022-10-06: 15:00:00 2 mL/h via INTRAVENOUS

## 2022-10-06 MED ORDER — MAGNESIUM SULFATE 4 GRAM/100 ML (4 %) IN WATER INTRAVENOUS PIGGYBACK
4 | INTRAVENOUS | Status: DC | PRN
Start: 2022-10-06 — End: 2022-10-11

## 2022-10-06 NOTE — Progress Notes
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 Just remains fatigued, was able to walk back and forth to the bathroom, His forehead/eye lid rash/swelling improving, OOB to chair prior to rounds, multiple small amount of soft stools when he urinates, but seems better today. No nausea or vomiting this morning, He is taking anti-emetics.  Wife at bed side during rounds.Review of Systems Constitutional:  Positive for malaise/fatigue. Negative for chills and fever. HENT: Negative.  Negative for sore throat.  Eyes: Negative.  Respiratory: Negative.  Negative for cough, sputum production, shortness of breath and wheezing.  Cardiovascular: Negative.  Negative for chest pain, palpitations and leg swelling. Gastrointestinal:  Positive for diarrhea and nausea. Negative for abdominal pain, constipation and vomiting. Genitourinary:  Negative for dysuria, frequency and urgency. Musculoskeletal: Negative.  Skin:  Positive for rash. Negative for itching.      Toes discolored - not new Neurological: Negative.  Psychiatric/Behavioral: Negative.   Review of Allergies/Meds/Hx: Allergies Allergen Reactions  Voriconazole Other (See Comments)   Gingival edema/redness making it difficult to bite down  Medications:Current Facility-Administered Medications:   acetaminophen (TYLENOL) tablet 650 mg, 650 mg, Oral, Q6H PRN, Flonnie [name], Rahiem Schellinger, PA  acyclovir (ZOVIRAX) capsule 400 mg, 400 mg, Oral, Q8H, Chrystine Oiler, MD, 400 mg at 10/06/22 0504  albuterol neb sol 2.5 mg/3 mL (0.083%) (PROVENTIL,VENTOLIN), 2.5 mg, Nebulization, Q30 MIN PRN, Chrystine Oiler, MD  amLODIPine (NORVASC) tablet 10 mg, 10 mg, Oral, Daily, Rall, Kerri A, PA, 10 mg at 10/06/22 4540  chlorhexidine gluconate (PERIDEX) 0.12 % solution 15 mL, 15 mL, Mouth/Throat, BID, Al-Musa, Amer, MD, 15 mL at 10/06/22 0829  clindamycin (CLEOCIN T) 1 % external solution, , Topical (Top), BID, Rall, Kerri A, [place], Given at 10/06/22 0840  diphenhydrAMINE (BENADRYL) injection 50 mg, 50 mg, IV Push, Once PRN, Chrystine Oiler, MD  diphenoxylate-atropine (LOMOTIL) 2.5-0.025 mg per tablet 1 tablet, 1 tablet, Oral, 4x DAILY PRN, Roetta Sessions, MD, 1 tablet at 10/02/22 1731  EPINEPHrine (EPI-PEN) auto-injector 0.3 mg, 0.3 mg, Intramuscular, Q15 MIN PRN, Chrystine Oiler, MD  EPINEPHrine (EPI-PEN) auto-injector 0.3 mg, 0.3 mg, Intramuscular, Q15 MIN PRN, Chrystine Oiler, MD  erythromycin (ROMYCIN) 5 mg/gram (0.5 %) ophthalmic ointment, , Left Eye, Nightly, Al-Musa, Denyse Dago, MD, Given at 10/05/22 2053  famotidine (PF) (PEPCID) 20 mg in sodium chloride 0.9% PF 5 mL Injection, 20 mg, IV Push, Once PRN, Chrystine Oiler, MD  filgrastim-sndz (ZARXIO) syringe 480 mcg, 5 mcg/kg (Treatment Plan Recorded), Subcutaneous, Daily (1700), Chrystine Oiler, MD, 480 mcg at 10/05/22 1754  finasteride (PROSCAR) tablet 5 mg, 5 mg, Oral, Daily, Al-Musa, Amer, MD, 5 mg at 10/06/22 9811  hydrocortisone sodium succinate (PF) (Solu-CORTEF) injection 100 mg, 100 mg, IV Push, Once PRN, Chrystine Oiler, MD  isavuconazonium (Cresemba) capsule Cap 372 mg, 372 mg, Oral, Daily, Chrystine Oiler, MD, 372 mg at 10/06/22 0828  latanoprost (XALATAN) 0.005 % ophthalmic solution 1 drop, 1 drop, Both Eyes, Nightly, Al-Musa, Amer, MD, 1 drop at 10/05/22 2053  loperamide (IMODIUM) capsule 2 mg, 2 mg, Oral, 4x DAILY PRN, Roetta Sessions, MD, 2 mg at 10/06/22 0829  LORazepam (  ATIVAN) tablet 0.5 mg, 0.5 mg, Oral, Q6H PRN, Chrystine Oiler, MD  magnesium sulfate in water 2 gram/50 mL (4 %) (IVPB) 2 g, 2 g, Intravenous, Q1H PRN **OR** magnesium sulfate in water 4 gram/100 mL (4 %) sterile water IVPB 4 g, 4 g, Intravenous, Q2H PRN **OR** magnesium sulfate in water 2 gram/50 mL (4 %) (IVPB) 2 g, 2 g, Intravenous, Q1H PRN, Pugliese, Amy Barile, APRN  normal saline 0.9 % oral rinse 30 mL, 30 mL, Oral, 4x DAILY PRN, William Hamburger, MD  ondansetron (PF) [facility]) injection 8 mg, 8 mg, IV Push, Q12H, Al-Musa, Amer, MD, 8 mg at 10/06/22 0504  piperacillin-tazobactam (ZOSYN) 4.5 g in sodium chloride 0.9% 100 mL (mini-bag plus), 4.5 g, Intravenous, Q6H, William Hamburger, MD, Last Rate: 33.3 mL/hr at 10/06/22 0831, 4.5 g at 10/06/22 0831  potassium chloride in sterile water 20 mEq/100 mL IVPB 20 mEq, 20 mEq, Intravenous, Q1H PRN, Magdalene River, MD, Last Rate: 100 mL/hr at 10/04/22 1726, 20 mEq at 10/04/22 1726  potassium chloride in sterile water 20 mEq/50 mL IVPB 20 mEq, 20 mEq, Intravenous, Q1H PRN, Macon Large, Amin, MD  prochlorperazine edisylate (COMPAZINE) injection 10 mg, 10 mg, IV Push, Q6H PRN, Chrystine Oiler, MD, 10 mg at 10/05/22 2053  sodium chloride 0.9 % (new bag) bolus 500 mL, 500 mL, Intravenous, Once PRN, Chrystine Oiler, MD  sodium chloride 0.9 % flush 10 mL, 10 mL, IV Push, PRN for [facility], Chrystine Oiler, MD, 20 mL at 09/30/22 0939  sodium chloride 0.9 % flush 3 mL, 3 mL, IV Push, Q8H, Al-Musa, Amer, MD, 3 mL at 09/30/22 2111  sodium chloride 0.9 % flush 3 mL, 3 mL, IV Push, PRN for [facility], Roetta Sessions, MD  sodium chloride 0.9% infusion, 5 mL/hr, Intravenous, PRN, Chrystine Oiler, MD  tacrolimus (PROGRAF) Immediate Release capsule 6.5 mg, 6.5 mg, Oral, Q12H (0600-1800), Nassar, Amin, MD, 6.5 mg at 10/06/22 0504  ursodioL (URSO 250) tablet 500 mg, 500 mg, Oral, BID, Chrystine Oiler, MD, 500 mg at 10/06/22 0828Objective: Temp:  [97.8 ?F (36.6 ?C)-99.2 ?F (37.3 ?C)] 99.2 ?F (37.3 ?C)Pulse:  [74-95] 95Resp:  [18-20] 20BP: (126-153)/(69-77) 130/73SpO2:  [96 %-98 %] 97 %I/O's:Gross Totals (Last 24 hours) at 10/06/2022 0908Last data filed at 10/06/2022 0831Intake -- Output 1376 ml Net -1376 ml Wt: 10/06/22 97.4 kg 09/16/22 104.2 kg 09/13/22 104.3 kg 09/11/22 105.9 kg 09/09/22 105.4 kg 09/04/22 106.4 kg Physical ExamConstitutional:     Appearance: Normal appearance. He is normal weight. He is not ill-appearing or diaphoretic. HENT:    Head: Normocephalic.    Nose: Nose normal.    Mouth/Throat:    Mouth: Mucous membranes are moist.    Pharynx: Oropharynx is clear. No oropharyngeal exudate. Eyes:    General: No scleral icterus.      Right eye: No discharge.       Left eye: No discharge. Cardiovascular:    Rate and Rhythm: Normal rate and regular rhythm.    Heart sounds: Normal heart sounds. No murmur heard.Pulmonary:    Effort: Pulmonary effort is normal. No respiratory distress.    Breath sounds: Normal breath sounds. No wheezing or rales. Abdominal:    General: Abdomen is flat. Bowel sounds are normal. There is no distension.    Palpations: Abdomen is soft.    Tenderness: There is no abdominal tenderness. There is no guarding. Musculoskeletal:       General: No swelling or deformity. Normal range of motion.    Right lower leg: No edema.  Left lower leg: No edema. Skin:   General: Skin is warm.    Findings: Rash (Mild erythema / edema of left eye lid; overall significantly improved; mild erythema over forehead) present.    Comments: Toes hyperpigmetned/?PVD - not newBruises, petechiae scattered Neurological:    Mental Status: He is alert and oriented to person, place, and time. Psychiatric:       Mood and Affect: Mood normal. Labs:Recent Results (from the past 24 hour(s)) CBC auto differential  Collection Time: 10/05/22 11:30 AM Result Value Ref Range  WBC 0.2 (L) 4.0 - 11.0 x1000/?L  RBC 2.69 (L) 4.00 - 6.00 M/?L  Hemoglobin 7.8 (L) 13.2 - 17.1 g/dL  Hematocrit 14.78 (L) 29.56 - 50.00 %  MCV 82.5 80.0 - 100.0 fL  MCH 29.0 27.0 - 33.0 pg  MCHC 35.1 31.0 - 36.0 g/dL  RDW-CV 21.3 08.6 - 57.8 %  Platelets 15 (L) 150 - 420 x1000/?L  MPV    Neutrophils    Lymphocytes    Monocytes    Eosinophils    Basophil    Immature Granulocytes    nRBC 33.3 (H) 0.0 - 1.0 %  Absolute Lymphocyte Count    Monocyte Absolute Count    Eosinophil Absolute Count    Basophil Absolute Count    Absolute Immature Granulocyte Count    Absolute nRBC 0.06 0.00 - 1.00 x 1000/?L  ANC (Abs Neutrophil Count) 0.05 (L) 2.00 - 7.60 x 1000/?L Immature Platelet Fraction (BH GH LMW YH)  Collection Time: 10/05/22 11:30 AM Result Value Ref Range  Immature Platelet Fraction 8.6 1.2 - 8.6 %  Absolute Immature Platelet Fraction 1.3 <20.0 x1000/?L CBC auto differential  Collection Time: 10/06/22  5:04 AM Result Value Ref Range  WBC 0.8 (L) 4.0 - 11.0 x1000/?L  RBC 2.71 (L) 4.00 - 6.00 M/?L  Hemoglobin 7.7 (L) 13.2 - 17.1 g/dL  Hematocrit 46.96 (L) 29.52 - 50.00 %  MCV 83.0 80.0 - 100.0 fL  MCH 28.4 27.0 - 33.0 pg  MCHC 34.2 31.0 - 36.0 g/dL  RDW-CV 84.1 32.4 - 40.1 %  Platelets 19 (L) 150 - 420 x1000/?L  MPV    nRBC 15.0 (H) 0.0 - 1.0 %  Absolute nRBC 0.12 0.00 - 1.00 x 1000/?L Comprehensive metabolic panel  Collection Time: 10/06/22  5:04 AM Result Value Ref Range  Sodium 140 136 - 144 mmol/L  Potassium 3.8 3.3 - 5.3 mmol/L  Chloride 108 (H) 98 - 107 mmol/L  CO2 18 (L) 20 - 30 mmol/L  Anion Gap 14 7 - 17  Glucose 104 (H) 70 - 100 mg/dL  BUN 9 8 - 23 mg/dL  Creatinine 0.27 2.53 - 1.30 mg/dL  Calcium 9.0 8.8 - 66.4 mg/dL  BUN/Creatinine Ratio 40.3 8.0 - 23.0  Total Protein 6.6 5.9 - 8.3 g/dL Albumin 3.5 (L) 3.6 - 5.1 g/dL  Total Bilirubin 0.7 <=4.7 mg/dL  Alkaline Phosphatase 91 9 - 122 U/L  Alanine Aminotransferase (ALT) 24 9 - 59 U/L  Aspartate Aminotransferase (AST) 27 10 - 35 U/L  Globulin 3.1 2.0 - 3.9 g/dL  A/G Ratio 1.1 1.0 - 2.2  AST/ALT Ratio 1.1 Reference Range Not Established  eGFR (Creatinine) >60 >=60 mL/min/1.33m2 Manual Differential  Collection Time: 10/06/22  5:04 AM Result Value Ref Range  Neutrophils 40.0 39.0 - 72.0 %  Bands 12.0 (H) 0.0 - 10.0 %  Lymphocytes 4.0 (L) 17.0 - 50.0 %  Monocytes 24.0 (H) 4.0 - 12.0 %  Eosinophils 0.0 0.0 - 5.0 %  Basophil 0.0 0.0 - 1.4 %  Metamyelocytes 8.0 (H) 0.0 - 1.0 %  Myelocytes 4.0 (H) 0.0 - 0.0 %  Promyelocytes 8.0 (H) 0.0 - 0.0 %  Neutrophils Absolute 0.42 (L) 2.00 - 7.60 x 1000/?L  Lymphocyte Absolute 0.03 (L) 0.60 - 3.70 x 1000/?L  Monocyte Absolute Count 0.19 0.00 - 1.00 x 1000/?L  Eosinophil Absolute Count 0.00 0.00 - 1.00 x 1000/?L  Basophil Absolute Count 0.00 0.00 - 1.00 x 1000/?L  RBC Morphology Reviewed   Polychromasia 1+ (A) None Immature Platelet Fraction (BH GH LMW YH)  Collection Time: 10/06/22  5:04 AM Result Value Ref Range  Immature Platelet Fraction 14.6 (H) 1.2 - 8.6 %  Absolute Immature Platelet Fraction 2.8 <20.0 x1000/?L Diagnostics:No results found.Assessment: Douglas [name] is a 72 y.o. 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 He is s/p 2 cycles of chemotherapy with Decitabine + Venetoclax with reduction in blast count. 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  Plan:  Day +12 (10/06/19): feeling better, WBC 800 today, awaiting for engraftment, no transfusion required todayContinue supportive care. Topicals including clindaymycin and erythromycin for skin folliculitis and eye swelling/erythema and symptoms resolving. PT consult todayConditioning/Transplant:-Melphalan 70 mg/m2 over 30 minutes on D-6, *Melphalan dosed reduced to 70 mg/m2 due to age-Fludarabine 40 mg/m2 over 30 minutes every 24hr for 4 doses from D-5 to D -2             -PT cyclophosphamide 25 mg/kg IV over 1.5 hrs every 24hr for 2 doses on D +3 & D +4 (reduced by 50% to 25 mg/kg due 10/10 match and age)-[place]esna 50 mg/kg CIVI daily, for 2 doses, starting 15 minutes before cyclophosphamide on D +3 & D +4-Stem cell infusion day 0 (09/24/22) HEME:  - Transfuse pRBCs to maintain Hb >7 g/dL, platelets >84 or if clinically indicated- Filgrastim 67mcg/kg starting on day +5 until stable engraftment of neutrophils.ID: Prophylactic antimicrobials: Acyclovir 400 mg PO TID (home medications),  Zosyn, Isavuconazole 372 mg po daily (prior authorization pending, voriconazole allergy-gingival hyperplasia), Pentamidine 300 mg inhalation monthly- CMV -/pending, attempt to get letermovir for CMV prophylaxis. Will send script on Day +7 to determine insurance coverage before starting inpatient.Check CMV PCR weekly.- If febrile, panculture, UA and CXR, start broad spectrum empiric antibiotics if neutropenic or septic appearing.   - Repeat blood cx every 24 hours with temperature > 100.4 or septic appearing GVHD Prophylaxis:  - currently on Tacrolimus 6.5mg  PO BID. level 6.1 today, goal of 6-11. - PT cyclophosphamide 25 mg/kg IV over 1.5 hrs every 24hr for 2 doses on D +3 & D +4 with mesna daily on Day +3 and Day +4  (PTCy dose reduced by 50% )- Monitor levels, haptoglobin, LDH, triglycerides.- Daily skin exam, liver function tests twice weekly (minimum), document stool output/day. Fluids, Electrolytes and Nutrition:- Regular diet, consider nutrition consult if needed- Daily weights- Strict Intake and output- Replete electrolytes PRNEmesis prophylaxis: Ondansetron 16 mg PO every 12 hours w/ PRN Compazine, Dexamethasone 12mg  po premed prior to melphalan High dose cyclophosphamide: doxepin premed 25 mg and diphenhydramine 25 mg 30 mins before Cytoxan; D5W 1/2NS + KCL 20 meq + Magnesium 1 gm alternating with D5W + of NaCHO3 + KCL 20 meq until 24 hours after last Cyclophosphamide dose (hold fluid during Cytoxan infusion)Diarrhea: imodium 2mg  PRN 4 times daily, lomotil 2.5-0.025 4x daily  PRN  VOD: - Continue Ursodiol 500mg  PO BID, continue until Day +90- Daily weights, strict fluid monitoring of all intake/output- Monitor liver function tests and coags at least twice weekly HTN: -Patient reports not taking his antiHTN meds for the last few days due to feeling weak . Restarted during hospital course in setting of elevated BP-Increased Norvasc to 10mg  8/17 in the setting of SBP 150s-160s. Continue to monitor. 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 improving2) Forehead erythema and left eyelid erythema and swelling with purpura on eyelid - improving so will hold off on bx per derm, felt to be hordeolum, however, given pt counts will continue empiric tx for cellulitis, improving- warm compresses PRN for left eye + started erythromycin ointment for possible rosacea  CVAD: Hickman for chemotherapy and supportive care Discharge Planning: PT consult  ordered 8/20 Pending count recovery 24 hour caregiver: wifeSigned:Venus Ruhe Flonnie [name], [phone]

## 2022-10-06 NOTE — Plan of Care
Plan of Care Overview/ Patient Status    LOS D18 - Day +12 s/p MUD SCT. Continues supportive care. Independent OOB. Access: hickman. Will need flushes ordered from the [facility] at discharge for line care. Discharge home pending count recovery. Family will provide transportation. Case Management will take lead to arrange post acute care services and continue to work with the care team as the patient progresses towards discharge.Tanna Furry BSN, RNCare Manager [phone]

## 2022-10-06 NOTE — Plan of Care
Plan of Care Overview/ Patient Status    1900-0730Pt received, A&Ox4, VSS, afebrile, on RA. Pt OOB independently and voiding spontaneously.No c/o of n/v/d or pain. RDL- hick c/d/i and flushed with +BR. Type 1/2 fluids Infusing with good urine output.Safety precautions maintain, hourly rounding completed, call bell within reach and morning labs completedPriscilla Loreta Ave RN

## 2022-10-06 NOTE — Plan of Care
Plan of Care Overview/ Patient Status    0700 - 1930PAIN - Patient reported no pain this shift; No facial grimaces nor any nonverbal indicators of pain present at this time. NEURO - AOX4; Patient is calm, pelasent, and cooperative. RESP - Patient is on room air; Stable O2 SATs; Clear bilateral lung fields; No s/sx of labored breathing nor respiratory distress. CV - S1 and S2 heard upon auscultation; No s/sx of cardiac distress nor edema present in all extremities. GI/GU - CONT.X2; LBM: 10/05/2022; Patient voids spontaneously. MUSK - Independent; OOB; Patient is able to turn and reposition as needed Q2H. SKIN - CDI; Minimal bruising. DIET - Regular diet; Poor appetite; Patient is able to swallow pills whole with water; Patient reported nausea this shift that was relieved by zofran and compazine. ACCESS - RDL Hickman dated: 10/04/2022; CDI; Flushes without difficultly with +BR; Patient received one unit of Platelets; No pre medications required; Patient tolerated the transfusion well. OTHER - Bed in the lowest position; Call bell and personal belongings within reach; Refer to flow sheets and POC for more information. VITAL SIGNS: Temp:  [97.5 ?F (36.4 ?C)-98.6 ?F (37 ?C)] 98.5 ?F (36.9 ?C)Pulse:  [70-87] 87Resp:  [18] 18BP: (118-145)/(72-80) 130/74SpO2:  [96 %-98 %] 97 %Device (Oxygen Therapy): room air Grace Blight, RN Problem: Adult Inpatient Plan of CareGoal: Plan of Care ReviewOutcome: Interventions implemented as appropriate Problem: Adult Inpatient Plan of CareGoal: Patient-Specific Goal (Individualized)Outcome: Interventions implemented as appropriate Problem: Adult Inpatient Plan of CareGoal: Absence of Hospital-Acquired Illness or InjuryOutcome: Interventions implemented as appropriate

## 2022-10-06 NOTE — Plan of Care
Plan of Care Overview/ Patient Status    0700-1530Patient is Day +12 s/p a MUD SCT for his AML.He is alert and oriented times four and independent in his room today. His wife was at the bedside and helped him to bath and ambulate one lap. He was afebrile on zosyn,acyclovir,and cresemba. He sat in the  cardiac chair most of the morning. His appetite is fair. The family requested a PT consult. He is on fall,neutropenic,and protective precautions for his safety. His tacrolimus level was 6.2 and he continues on oral dosing. He was replaced potassium chloride and magnesium sulfate per sliding scale today. He got alternating lomotil and imodium for diarrhea today. He is c.diff negative.

## 2022-10-07 ENCOUNTER — Ambulatory Visit: Admit: 2022-10-07 | Payer: MEDICARE

## 2022-10-07 LAB — CBC WITH AUTO DIFFERENTIAL
BKR WAM ABSOLUTE NRBC (2 DEC): 0.18 x 1000/ÂµL (ref 0.00–1.00)
BKR WAM HEMATOCRIT (2 DEC): 19.8 % — ABNORMAL LOW (ref 38.50–50.00)
BKR WAM HEMOGLOBIN: 6.7 g/dL — ABNORMAL LOW (ref 13.2–17.1)
BKR WAM MCH (PG): 28.5 pg (ref 27.0–33.0)
BKR WAM MCHC: 33.8 g/dL (ref 31.0–36.0)
BKR WAM MCV: 84.3 fL (ref 80.0–100.0)
BKR WAM NUCLEATED RED BLOOD CELLS: 4.9 % — ABNORMAL HIGH (ref 0.0–1.0)
BKR WAM PLATELETS: 23 x1000/ÂµL — ABNORMAL LOW (ref 150–420)
BKR WAM RDW-CV: 15.4 % — ABNORMAL HIGH (ref 11.0–15.0)
BKR WAM RED BLOOD CELL COUNT.: 2.35 M/ÂµL — ABNORMAL LOW (ref 4.00–6.00)
BKR WAM WHITE BLOOD CELL COUNT: 3.6 x1000/ÂµL — ABNORMAL LOW (ref 4.0–11.0)

## 2022-10-07 LAB — MANUAL DIFFERENTIAL
BKR WAM BASOPHIL - ABS (DIFF) 2 DEC: 0 x 1000/ÂµL (ref 0.00–1.00)
BKR WAM BASOPHILS (DIFF): 0 % (ref 0.0–1.4)
BKR WAM EOSINOPHILS (DIFF) 2 DEC: 0 x 1000/ÂµL (ref 0.00–1.00)
BKR WAM EOSINOPHILS (DIFF): 0 % (ref 0.0–5.0)
BKR WAM LYMPHOCYTE - ABS (DIFF) 2 DEC: 0.03 x 1000/ÂµL — ABNORMAL LOW (ref 0.60–3.70)
BKR WAM LYMPHOCYTES (DIFF): 0.9 % — ABNORMAL LOW (ref 17.0–50.0)
BKR WAM METAMYELOCYTES (DIFF) 1 DEC: 3.5 % — ABNORMAL HIGH (ref 0.0–1.0)
BKR WAM MONOCYTE - ABS (DIFF) 2 DEC: 0.52 x 1000/ÂµL (ref 0.00–1.00)
BKR WAM MONOCYTES (DIFF): 14.2 % — ABNORMAL HIGH (ref 4.0–12.0)
BKR WAM MYELOCYTES (DIFF) 1 DEC: 0.9 % — ABNORMAL HIGH (ref 0.0–0.0)
BKR WAM NEUTROPHILS (DIFF): 80.5 % — ABNORMAL HIGH (ref 39.0–72.0)
BKR WAM NEUTROPHILS - ABS (DIFF) 2 DEC: 2.93 x 1000/ÂµL (ref 2.00–7.60)
BKR WAM NORMAL RBC MORPHOLOGY: NORMAL

## 2022-10-07 LAB — IMMATURE PLATELET FRACTION (BH GH LMW YH)
BKR WAM IPF, ABSOLUTE: 3.4 x1000/ÂµL (ref <20.0)
BKR WAM IPF: 14.8 % — ABNORMAL HIGH (ref 1.2–8.6)

## 2022-10-07 LAB — COMPREHENSIVE METABOLIC PANEL
BKR A/G RATIO: 1.1 (ref 1.0–2.2)
BKR ALANINE AMINOTRANSFERASE (ALT): 18 U/L (ref 9–59)
BKR ALBUMIN: 3.3 g/dL — ABNORMAL LOW (ref 3.6–5.1)
BKR ALKALINE PHOSPHATASE: 91 U/L (ref 9–122)
BKR ANION GAP: 12 (ref 7–17)
BKR ASPARTATE AMINOTRANSFERASE (AST): 28 U/L (ref 10–35)
BKR AST/ALT RATIO: 1.6
BKR BILIRUBIN TOTAL: 0.5 mg/dL (ref <=1.2)
BKR BLOOD UREA NITROGEN: 13 mg/dL (ref 8–23)
BKR BUN / CREAT RATIO: 12.3 (ref 8.0–23.0)
BKR CALCIUM: 8.8 mg/dL (ref 8.8–10.2)
BKR CHLORIDE: 109 mmol/L — ABNORMAL HIGH (ref 98–107)
BKR CO2: 19 mmol/L — ABNORMAL LOW (ref 20–30)
BKR CREATININE: 1.06 mg/dL (ref 0.40–1.30)
BKR EGFR, CREATININE (CKD-EPI 2021): >60 mL/min/{1.73_m2} (ref >=60)
BKR GLOBULIN: 2.9 g/dL (ref 2.0–3.9)
BKR GLUCOSE: 93 mg/dL (ref 70–100)
BKR POTASSIUM: 3.6 mmol/L (ref 3.3–5.3)
BKR PROTEIN TOTAL: 6.2 g/dL (ref 5.9–8.3)
BKR SODIUM: 140 mmol/L (ref 136–144)

## 2022-10-07 LAB — MAGNESIUM: BKR MAGNESIUM: 1.8 mg/dL (ref 1.7–2.4)

## 2022-10-07 LAB — TACROLIMUS LEVEL     (BH GH L LMW YH): BKR TACROLIMUS BLOOD: 7.2 ng/mL

## 2022-10-07 MED ORDER — FOLIC ACID 1 MG TABLET
1 mg | ORAL_TABLET | Freq: Every day | ORAL | 12 refills | 30.00 days | Status: AC
Start: 2022-10-07 — End: ?
  Filled 2022-10-10: qty 30, 30d supply, fill #0

## 2022-10-07 MED ORDER — SODIUM CHLORIDE 0.9 % BOLUS (NEW BAG)
0.9 | Freq: Once | INTRAVENOUS | Status: CP
Start: 2022-10-07 — End: ?
  Administered 2022-10-07: 20:00:00 0.9 mL/h via INTRAVENOUS

## 2022-10-07 MED ORDER — ONDANSETRON HCL (PF) 4 MG/2 ML INJECTION SOLUTION
4 | Freq: Two times a day (BID) | INTRAVENOUS | Status: DC | PRN
Start: 2022-10-07 — End: 2022-10-11
  Administered 2022-10-07 – 2022-10-09 (×3): 4 mL via INTRAVENOUS

## 2022-10-07 MED ORDER — ACYCLOVIR 400 MG TABLET
400 | Freq: Two times a day (BID) | ORAL | Status: DC
Start: 2022-10-07 — End: 2022-10-11
  Administered 2022-10-08 – 2022-10-10 (×6): 400 mg via ORAL

## 2022-10-07 MED ORDER — MAGNESIUM OXIDE 400 MG (241.3 MG MAGNESIUM) TABLET
400 | Freq: Two times a day (BID) | ORAL | Status: DC
Start: 2022-10-07 — End: 2022-10-11
  Administered 2022-10-08 – 2022-10-10 (×6): 400 mg (241.3 mg magnesium) via ORAL

## 2022-10-07 NOTE — Plan of Care
Plan of Care Overview/ Patient Status    19:00-07:00a: Pt is D+13 s/p ALLO. A&Ox4. VSS. Afebrile. RA. Pills whole. NO c/o pain or N/V/D this shift. RDLHick flushes w/o difficulty, +BR, dressing CDI. NO repletions or transfusions during this shift. Skin intact. Continent of B-B.  Ind OOB. Patient is able to turn and reposition on his own, currently resting in bed.  Please see flowsheets for any additional information needed. Neutropenic/Safety precautions set and maintained. Care plan maintained throughout shift. Travian Kerner Sagastume, RNProblem: Adult Inpatient Plan of CareGoal: Optimal Comfort and WellbeingOutcome: Interventions implemented as appropriate Problem: Fall Injury RiskGoal: Absence of Fall and Fall-Related InjuryOutcome: Interventions implemented as appropriate Problem: Stem Cell/Bone Marrow TransplantGoal: Optimal Coping with TransplantOutcome: Interventions implemented as appropriate Problem: Oral Mucositis (Chemotherapy Effects)Goal: Improved Oral Mucous Membrane IntegrityOutcome: Interventions implemented as appropriate

## 2022-10-07 NOTE — Plan of Care
Plan of Care Overview/ Patient Status    1500-1900This shift, Mr. Mine was AOx4, afebrile, independent OOB, on room air.RDL Hickman patent with +BR maintained, dressing c/d/i.No c/o pain/n/v/c. Given imodium x1.1L NS bolus infusing over 4 hrs.Takes pills whole with water, voiding spontaneously.Fall/safety precautions maintained, call bell/belongings within reach, turns and repositions self.Please see flowsheet and plan of care for additional information.Marlin Canary, RN

## 2022-10-07 NOTE — Progress Notes
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 states he is feeling better, walked in hallway yesterday with walker. OOB to chair prior to rounds, remains afebrile, no shortness of breath or chest pain. Appetite slightly improving, eat more fruits this morning, no N/V, diarrhea improving, no new rash or bleeding noted.Review of Systems Constitutional:  Positive for malaise/fatigue. Negative for chills and fever. HENT: Negative.  Negative for sore throat.  Eyes: Negative.  Respiratory: Negative.  Negative for cough, sputum production, shortness of breath and wheezing.  Cardiovascular: Negative.  Negative for chest pain, palpitations and leg swelling. Gastrointestinal:  Positive for diarrhea. Negative for abdominal pain, constipation, nausea and vomiting. Genitourinary:  Negative for dysuria, frequency and urgency. Musculoskeletal: Negative.  Skin:  Positive for rash. Negative for itching.      Toes discolored - not new Neurological: Negative.  Psychiatric/Behavioral: Negative.   Review of Allergies/Meds/Hx: Allergies Allergen Reactions  Voriconazole Other (See Comments)   Gingival edema/redness making it difficult to bite down  Medications:Current Facility-Administered Medications:   acetaminophen (TYLENOL) tablet 650 mg, 650 mg, Oral, Q6H PRN, Flonnie Hailstone, Aliveah Gallant, PA  acyclovir (ZOVIRAX) capsule 400 mg, 400 mg, Oral, Q8H, Chrystine Oiler, MD, 400 mg at 10/07/22 0455  albuterol neb sol 2.5 mg/3 mL (0.083%) (PROVENTIL,VENTOLIN), 2.5 mg, Nebulization, Q30 MIN PRN, Chrystine Oiler, MD  amLODIPine (NORVASC) tablet 10 mg, 10 mg, Oral, Daily, Rall, Kerri A, PA, 10 mg at 10/06/22 2841  chlorhexidine gluconate (PERIDEX) 0.12 % solution 15 mL, 15 mL, Mouth/Throat, BID, Al-Musa, Amer, MD, 15 mL at 10/06/22 2036  clindamycin (CLEOCIN T) 1 % external solution, , Topical (Top), BID, Rall, Kerri A, [place], Given at 10/06/22 2037  diphenhydrAMINE (BENADRYL) injection 50 mg, 50 mg, IV Push, Once PRN, Chrystine Oiler, MD  diphenoxylate-atropine (LOMOTIL) 2.5-0.025 mg per tablet 1 tablet, 1 tablet, Oral, 4x DAILY PRN, Roetta Sessions, MD, 1 tablet at 10/06/22 1202  EPINEPHrine (EPI-PEN) auto-injector 0.3 mg, 0.3 mg, Intramuscular, Q15 MIN PRN, Chrystine Oiler, MD  EPINEPHrine (EPI-PEN) auto-injector 0.3 mg, 0.3 mg, Intramuscular, Q15 MIN PRN, Chrystine Oiler, MD  erythromycin (ROMYCIN) 5 mg/gram (0.5 %) ophthalmic ointment, , Left Eye, Nightly, Al-Musa, Denyse Dago, MD, Given at 10/06/22 2036  famotidine (PF) (PEPCID) 20 mg in sodium chloride 0.9% PF 5 mL Injection, 20 mg, IV Push, Once PRN, Chrystine Oiler, MD  filgrastim-sndz (ZARXIO) syringe 480 mcg, 5 mcg/kg (Treatment Plan Recorded), Subcutaneous, Daily (1700), Chrystine Oiler, MD, 480 mcg at 10/06/22 1833  finasteride (PROSCAR) tablet 5 mg, 5 mg, Oral, Daily, Al-Musa, Amer, MD, 5 mg at 10/06/22 3244  hydrocortisone sodium succinate (PF) (Solu-CORTEF) injection 100 mg, 100 mg, IV Push, Once PRN, Chrystine Oiler, MD  isavuconazonium (Cresemba) capsule Cap 372 mg, 372 mg, Oral, Daily, Chrystine Oiler, MD, 372 mg at 10/06/22 0828  latanoprost (XALATAN) 0.005 % ophthalmic solution 1 drop, 1 drop, Both Eyes, Nightly, Al-Musa, Amer, MD, 1 drop at 10/06/22 2037  loperamide (IMODIUM) capsule 2 mg, 2 mg, Oral, 4x DAILY PRN, Roetta Sessions, MD, 2 mg at 10/06/22 0829  LORazepam (ATIVAN) tablet 0.5 mg, 0.5 mg, Oral, Q6H PRN, Chrystine Oiler, MD  magnesium sulfate  in water 2 gram/50 mL (4 %) (IVPB) 2 g, 2 g, Intravenous, Q1H PRN **OR** magnesium sulfate in water 4 gram/100 mL (4 %) sterile water IVPB 4 g, 4 g, Intravenous, Q2H PRN **OR** magnesium sulfate in water 2 gram/50 mL (4 %) (IVPB) 2 g, 2 g, Intravenous, Q1H PRN, Pugliese, Amy Barile, APRN, Last Rate: 50 mL/hr at 10/06/22 1033, 2 g at 10/06/22 1033  normal saline 0.9 % oral rinse 30 mL, 30 mL, Oral, 4x DAILY PRN, William Hamburger, MD  ondansetron (PF) [facility]) injection 8 mg, 8 mg, IV Push, Q12H, Roetta Sessions, MD, 8 mg at 10/07/22 0454  piperacillin-tazobactam (ZOSYN) 4.5 g in sodium chloride 0.9% 100 mL (mini-bag plus), 4.5 g, Intravenous, Q6H, William Hamburger, MD, Last Rate: 33.3 mL/hr at 10/07/22 0219, 4.5 g at 10/07/22 0219  potassium chloride in sterile water 20 mEq/100 mL IVPB 20 mEq, 20 mEq, Intravenous, Q1H PRN, Magdalene River, MD, Last Rate: 100 mL/hr at 10/06/22 1336, 20 mEq at 10/06/22 1336  potassium chloride in sterile water 20 mEq/50 mL IVPB 20 mEq, 20 mEq, Intravenous, Q1H PRN, Magdalene River, MD  prochlorperazine edisylate (COMPAZINE) injection 10 mg, 10 mg, IV Push, Q6H PRN, Chrystine Oiler, MD, 10 mg at 10/05/22 2053  sodium chloride 0.9 % (new bag) bolus 500 mL, 500 mL, Intravenous, Once PRN, Chrystine Oiler, MD  sodium chloride 0.9 % flush 10 mL, 10 mL, IV Push, PRN for [facility], Chrystine Oiler, MD, 20 mL at 09/30/22 0939  sodium chloride 0.9 % flush 3 mL, 3 mL, IV Push, Q8H, Al-Musa, Amer, MD, 3 mL at 10/07/22 0500  sodium chloride 0.9 % flush 3 mL, 3 mL, IV Push, PRN for [facility], Roetta Sessions, MD  sodium chloride 0.9% infusion, 5 mL/hr, Intravenous, PRN, Chrystine Oiler, MD  tacrolimus (PROGRAF) Immediate Release capsule 6.5 mg, 6.5 mg, Oral, Q12H (0600-1800), Nassar, Amin, MD, 6.5 mg at 10/07/22 0500  ursodioL (URSO 250) tablet 500 mg, 500 mg, Oral, BID, Chrystine Oiler, MD, 500 mg at 10/06/22 2036Objective: Temp:  [97.5 ?F (36.4 ?C)-99.2 ?F (37.3 ?C)] 97.8 ?F (36.6 ?C)Pulse:  [79-95] 79Resp:  [18-20] 18BP: (123-130)/(68-76) 123/69SpO2:  [94 %-97 %] 94 %I/O's:Gross Totals (Last 24 hours) at 10/07/2022 0658Last data filed at 10/07/2022 0453Intake 1340.88 ml Output 426 ml Net 914.88 ml Wt: 10/07/22 100.9 kg 09/16/22 104.2 kg 09/13/22 104.3 kg 09/11/22 105.9 kg 09/09/22 105.4 kg 09/04/22 106.4 kg Physical ExamConstitutional:     General: He is not in acute distress.HENT:    Nose: Nose normal.    Mouth/Throat:    Mouth: Mucous membranes are moist.    Pharynx: Oropharynx is clear. No oropharyngeal exudate. Eyes:    Conjunctiva/sclera: Conjunctivae normal. Cardiovascular:    Rate and Rhythm: Normal rate and regular rhythm.    Heart sounds: Normal heart sounds. No murmur heard.Pulmonary:    Effort: Pulmonary effort is normal.    Breath sounds: Normal breath sounds. No wheezing. Abdominal:    General: Abdomen is flat. Bowel sounds are normal.    Palpations: Abdomen is soft.    Tenderness: There is no abdominal tenderness. Musculoskeletal:    Right lower leg: No edema.    Left lower leg: No edema. Skin:   General: Skin is warm.    Findings: Rash: Mild erythema / edema of left eye lid; overall significantly improved; mild erythema over forehead.    Comments: Toes hyperpigmetned/?PVD - not newBruises, petechiae scattered Neurological:    Mental Status: He is alert and oriented to person, place, and time.  Mental status is at baseline. Psychiatric:       Mood and Affect: Mood normal. Labs:Recent Results (from the past 24 hour(s)) Magnesium  Collection Time: 10/07/22  5:00 AM Result Value Ref Range  Magnesium 1.8 1.7 - 2.4 mg/dL Comprehensive metabolic panel  Collection Time: 10/07/22  5:00 AM Result Value Ref Range  Sodium 140 136 - 144 mmol/L  Potassium 3.6 3.3 - 5.3 mmol/L  Chloride 109 (H) 98 - 107 mmol/L  CO2 19 (L) 20 - 30 mmol/L  Anion Gap 12 7 - 17  Glucose 93 70 - 100 mg/dL  BUN 13 8 - 23 mg/dL  Creatinine 1.30 8.65 - 1.30 mg/dL  Calcium 8.8 8.8 - 78.4 mg/dL  BUN/Creatinine Ratio 69.6 8.0 - 23.0  Total Protein 6.2 5.9 - 8.3 g/dL  Albumin 3.3 (L) 3.6 - 5.1 g/dL  Total Bilirubin 0.5 <=2.9 mg/dL  Alkaline Phosphatase 91 9 - 122 U/L  Alanine Aminotransferase (ALT) 18 9 - 59 U/L  Aspartate Aminotransferase (AST) 28 10 - 35 U/L  Globulin 2.9 2.0 - 3.9 g/dL  A/G Ratio 1.1 1.0 - 2.2  AST/ALT Ratio 1.6 Reference Range Not Established  eGFR (Creatinine) >60 >=60 mL/min/1.75m2 CBC auto differential  Collection Time: 10/07/22  5:00 AM Result Value Ref Range  WBC 3.6 (L) 4.0 - 11.0 x1000/?L  RBC 2.35 (L) 4.00 - 6.00 M/?L  Hemoglobin 6.7 (L) 13.2 - 17.1 g/dL  Hematocrit 52.84 (L) 13.24 - 50.00 %  MCV 84.3 80.0 - 100.0 fL  MCH 28.5 27.0 - 33.0 pg  MCHC 33.8 31.0 - 36.0 g/dL  RDW-CV 40.1 (H) 02.7 - 15.0 %  Platelets 23 (L) 150 - 420 x1000/?L  MPV    nRBC 4.9 (H) 0.0 - 1.0 %  Absolute nRBC 0.18 0.00 - 1.00 x 1000/?L Manual Differential  Collection Time: 10/07/22  5:00 AM Result Value Ref Range  Neutrophils 80.5 (H) 39.0 - 72.0 %  Lymphocytes 0.9 (L) 17.0 - 50.0 %  Monocytes 14.2 (H) 4.0 - 12.0 %  Eosinophils 0.0 0.0 - 5.0 %  Basophil 0.0 0.0 - 1.4 %  Metamyelocytes 3.5 (H) 0.0 - 1.0 %  Myelocytes 0.9 (H) 0.0 - 0.0 %  Neutrophils Absolute 2.93 2.00 - 7.60 x 1000/?L  Lymphocyte Absolute 0.03 (L) 0.60 - 3.70 x 1000/?L  Monocyte Absolute Count 0.52 0.00 - 1.00 x 1000/?L  Eosinophil Absolute Count 0.00 0.00 - 1.00 x 1000/?L  Basophil Absolute Count 0.00 0.00 - 1.00 x 1000/?L  RBC Morphology Normal  Immature Platelet Fraction (BH GH LMW YH)  Collection Time: 10/07/22  5:00 AM Result Value Ref Range  Immature Platelet Fraction 14.8 (H) 1.2 - 8.6 %  Absolute Immature Platelet Fraction 3.4 <20.0 x1000/?L Diagnostics:No results found.Assessment: Douglas [name] is a 72 y.o. 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 He is s/p 2 cycles of chemotherapy with Decitabine + Venetoclax with reduction in blast count. 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  Plan:  Day +13 (10/07/19): continues to improve, engrafted today, required pRBCs transfusion.Continue supportive care. Plan for discharge in one to two days, Conditioning/Transplant:-Melphalan 70 mg/m2 over 30 minutes on D-6, *Melphalan dosed reduced to 70 mg/m2 due to age-Fludarabine 40 mg/m2 over 30 minutes every 24hr for 4 doses from D-5 to D -2             -PT  cyclophosphamide 25 mg/kg IV over 1.5 hrs every 24hr for 2 doses on D +3 & D +4 (reduced by 50% to 25 mg/kg due 10/10 match and age)-Mesna 50 mg/kg CIVI daily, for 2 doses, starting 15 minutes before cyclophosphamide on D +3 & D +4-Stem cell infusion day 0 (09/24/22) HEME:  - Transfuse pRBCs to maintain Hb >7 g/dL, platelets >21 or if clinically indicated- Filgrastim 42mcg/kg started on day +5 and discontinued on day+13 with stable engraftment of neutrophils.ID: Prophylactic antimicrobials: Acyclovir 400 mg PO TID (home medications),  Zosyn dc'd 8/21 with engraftment, Isavuconazole 372 mg po daily (prior authorization pending, voriconazole allergy-gingival hyperplasia), Pentamidine 300 mg inhalation monthly- CMV -/-, Check CMV PCR weekly.- If febrile, panculture, UA and CXR, start broad spectrum empiric antibiotics if neutropenic or septic appearing.   - Repeat blood cx every 24 hours with temperature > 100.4 or septic appearing GVHD Prophylaxis:  - currently on Tacrolimus 6.5mg  PO BID. level 7.2 today, goal of 6-11. - PT cyclophosphamide 25 mg/kg IV over 1.5 hrs every 24hr for 2 doses on D +3 & D +4 with mesna daily on Day +3 and Day +4  (PTCy dose reduced by 50% )- Monitor levels, haptoglobin, LDH, triglycerides.- Daily skin exam, liver function tests twice weekly (minimum), document stool output/day. Fluids, Electrolytes and Nutrition:- Regular diet, consider nutrition consult if needed- Daily weights- Strict Intake and output- Replete electrolytes PRNEmesis prophylaxis: Ondansetron 16 mg PO every 12 hours w/ PRN Compazine, Dexamethasone 12mg  po premed prior to melphalan High dose cyclophosphamide: doxepin premed 25 mg and diphenhydramine 25 mg 30 mins before Cytoxan; D5W 1/2NS + KCL 20 meq + Magnesium 1 gm alternating with D5W + of NaCHO3 + KCL 20 meq until 24 hours after last Cyclophosphamide dose (hold fluid during Cytoxan infusion)Diarrhea: imodium 2mg  PRN 4 times daily, lomotil 2.5-0.025 4x daily PRN  VOD: - Continue Ursodiol 500mg  PO BID, continue until Day +90- Daily weights, strict fluid monitoring of all intake/output- Monitor liver function tests and coags at least twice weekly HTN: -Patient reports not taking his antiHTN meds for the last few days due to feeling weak . Restarted during hospital course in setting of elevated BP-Increased Norvasc to 10mg  8/17 in the setting of SBP 150s-160s. Continue to monitor. 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 improving2) Forehead erythema and left eyelid erythema and swelling with purpura on eyelid - improving so will hold off on bx per derm, felt to be hordeolum, however, given pt counts will continue empiric tx for cellulitis, improving- warm compresses PRN for left eye + started erythromycin ointment for possible rosacea  CVAD: [facility] for chemotherapy and supportive care Discharge Planning: PT consult  ordered 8/20, 24 hour caregiver: wifeSigned:Kyrell Ruacho Flonnie Hailstone, [phone]

## 2022-10-07 NOTE — Plan of Care
Plan of Care Overview/ Patient Status    1500-2300: Patient received alert and oriented. Day +12 s/p Allo SCT. VSS, afebrile. Continues on IVABX. R DBL [place] intact and patent, claves changed. Denies pain. Reports bowel movement consistency is improved from watery to soft. LBM today. Voiding spontaneously, urine clear and yellow. Idependent OOB, steady gait observed but says he feels tired when OOB to the bathroom. Complaints of some queasiness, continues on scheduled antiemetics with no need for prns. Resting comfortably at this time. All safety precautions maintained, call bell within reach. Will cont to monitor.

## 2022-10-07 NOTE — Plan of Care
Plan of Care Overview/ Patient Status    0700-1530Patient is Day +13 s/p a MUDSCT for his AML.He is alert and oriented times four and independent in his room today. He was transfused red blood cells for a hemoglobin of 6.7. He engraphed his blood counts and his neupogen and antibiotics were discontinued.He will be seen by PT this afternoon for an evaluation.He is afebrile so far today on cresemba and acyclovir. He was replaced potassium chloride per sliding scale . He got one imodium for his loose stool since he is c.diff negative. He is on fall and protective  precautions for his safety. He said his family is coming down with colds and he was told they need to be tested for Covid.

## 2022-10-07 NOTE — Plan of Care
Inpatient Physical Therapy Evaluation IP Adult PT Eval/Treat - 10/07/22 1500    Date of Visit / Treatment  Date of Visit / Treatment 10/07/22   Note Type Evaluation   Progress Report Due 10/21/22   Start Time 1420   End Time 1500   Total Treatment Time 40    General Information  Pertinent History Of Current Problem per chart Douglas [name] states he is feeling improved this morning. Just remains fatigued, was able to walk back and forth to the bathroom, His forehead/eye lid rash/swelling improving, OOB to chair prior to rounds, multiple small amount of soft stools when he urinates, but seems better today. No nausea or vomiting this morning, He is taking anti-emetics.  Wife at bed side during rounds.     Subjective pt agreeable   General Observations pt in bed, session cleared with RN prior, on NP 11   Precautions/Limitations Fall Precautions    Prior Level of Functioning/Social History  Additional Comments Pt lives with his daughter and her family and their 4 dogs and his wife in a 2 story house with no STE.  His bedroom is currently on the 2nd floor but he can have his bedroom moved to the first floor if needed.  They are also debating on staying at the suits for 2 weeks after DC.  The stairs do have a railing but he struggled to get up the stairs prior to him coming to the hospital.  He ambulates with no AD and is A for ADL's.    Vital Signs and Orthostatic Vital Signs  Vital Signs Vital Signs Stable    Pain/Comfort  Pain Comment (Pre/Post Treatment Pain) no co pain throughout    Cognition  Following Commands Follows all commands and directions without difficulty    Vision/ Hearing  Vision Assessment Results No vision deficits noted    Range of Motion  Range of Motion Examination bilateral upper extremity ROM was WFL;bilateral lower extremity ROM was [facility]    Musculoskeletal  LUE Muscle Strength Grading 4-->active movement against gravity and resistance   RUE Muscle Strength Grading 4-->active movement against gravity and resistance   LLE Muscle Strength Grading 4-->active movement against gravity and resistance   RLE Muscle Strength Grading 4-->active movement against gravity and resistance    Muscle Tone  Muscle Tone Testing Results No muscle tone deficits noted    Neurologic  Neurologic WDL WDL    Coordination  Coordination Comments grossly intact    Sensory Assessment  Sensory Tests Results No sensory impairment noted    Skin Assessment  Skin Assessment See Nursing Documentation    Balance  Sitting Balance: Static  GOOD-    Maintains static position against minimal resistance with no Assistive Device   Sitting Balance: Dynamic  GOOD-   Independent in dynamic balance activities (ambulates on level surfaces with no Assistive Device, picks up object from floor in sitting)   Standing Balance: Static FAIR+     Maintains static position without assist or device, may require Supervision or Verbal Cues (>2 minutes)   Standing Balance: Dynamic  FAIR+    Performs dynamic activities through full range with Supervision   Balance Assist Device Rolling walker    Bed Mobility  Supine-to-Sit Independence/Assistance Level Supervision   Supine-to-Sit Assist Device No device   Sit-to-Supine Independence/Assistance Level Supervision   Sit-to-Supine Assist Device No device    Sit-Stand Transfer Training  Sit-to-Stand Transfer Independence/Assistance Level Supervision   Sit-to-Stand Transfer Assist Device Rolling walker   Stand-to-Sit  Transfer Independence/Assistance Level Supervision   Armed forces logistics/support/administrative officer  Independence/Assistance Level  Supervision   Assistive Device  Rolling walker   Gait Distance 400 feet   Gait Pattern Analysis step through gait    Handoff Documentation  Handoff Patient in bed;Patient instructed to call nursing for mobility;Discussed with nursing    Activity Tolerance  Activity Tolerance Comments fair    PT- AM-PAC - Basic Mobility Screen- How much help from another person do you currently need.....  Turning from your back to your side while in a a flat bed without using rails? 3 - A Little - Requires a little help (supervision, minimal assistance). Can use assistive devices.   Moving from lying on your back to sitting on the side of a flat bed without using bed rails? 3 - A Little - Requires a little help (supervision, minimal assistance). Can use assistive devices.   Moving to and from a bed to a chair (including a wheelchair)? 3 - A Little - Requires a little help (supervision, minimal assistance). Can use assistive devices.   Standing up from a chair using your arms(e.g., wheelchair or bedside chair)? 3 - A Little - Requires a little help (supervision, minimal assistance). Can use assistive devices.   To walk in a hospital room? 3 - A Little - Requires a little help (supervision, minimal assistance). Can use assistive devices.   Climbing 3-5 steps with a railing? 1 - Total - Requires total assistance or cannot do it at all.   AMPAC Mobility Score 16   TARGET Highest Level of Mobility Mobility Level 5, Stand for 1 minute   ACTUAL Highest Level of Mobility Mobility Level 8, Walk 250+ feet    Clinical Impression  Initial Assessment Pt tolerated evaluation fairly well with good effort throughout.  Pt able to perform all levels of mobility with supervision, including ambulation for 400 ft.  Recommend DC to working towards home pending stairs or agreement with family to move bedroom to bottom floor with home health physical therapy, 3 in 1 comode and RW.  Recommend continued PT while in the hospital setting to maximize functional mobility.    Patient/Family Stated Goals  Patient/Family Stated Goal(s) return home;feel better;get stronger    Frequency/Equipment Recommendations  PT Frequency 3x per week   Next Treatment Expected 10/08/22   in AM   PT Recommendations for Inpatient Admission  Activity/Level of Assist out of bed;ambulate;assist of 1;with rolling walker;in hall;in room    Education - Learning Assessment   Education Topic Functional mobility techniques   Learners Patient   Readiness Eager   Method Explanation   Response Verbalizes Understanding    Planned Treatment / Interventions  Plan for Next Visit progress as toelrated    PT Discharge Summary  Physical Therapy Disposition Recommendation Other   Additional Physical Therapy Disposition Recommendations Working towards home;Home Physical Therapy   Equipment Recommendations for Discharge COMMODE - The patient is limited to one room in the home and will require a bedside Commode;ROLLING WALKER - The patient will use a Rolling Walker in the home and outside daily to provide greater stability and safer ambulation for participation in ADLs     Problem: Physical Therapy GoalsGoal: Physical Therapy GoalsDescription: PT GOALS1. Patient will perform bed mobility independently2. Patient will perform transfers with least assistive device with modified independence3. Patient will ambulate a minimum of 150 feet with least assistive device with modified independence4. Patient will ascend and descend at  least 5 steps with modified independenceOutcome: Initial problem identification

## 2022-10-08 ENCOUNTER — Encounter: Admit: 2022-10-08 | Payer: PRIVATE HEALTH INSURANCE

## 2022-10-08 LAB — COMPREHENSIVE METABOLIC PANEL
BKR A/G RATIO: 1.1 (ref 1.0–2.2)
BKR ALANINE AMINOTRANSFERASE (ALT): 18 U/L (ref 9–59)
BKR ALBUMIN: 3.2 g/dL — ABNORMAL LOW (ref 3.6–5.1)
BKR ALKALINE PHOSPHATASE: 118 U/L (ref 9–122)
BKR ANION GAP: 13 (ref 7–17)
BKR ASPARTATE AMINOTRANSFERASE (AST): 29 U/L (ref 10–35)
BKR AST/ALT RATIO: 1.6
BKR BILIRUBIN TOTAL: 0.4 mg/dL (ref <=1.2)
BKR BLOOD UREA NITROGEN: 12 mg/dL (ref 8–23)
BKR BUN / CREAT RATIO: 14.3 (ref 8.0–23.0)
BKR CALCIUM: 8.7 mg/dL — ABNORMAL LOW (ref 8.8–10.2)
BKR CHLORIDE: 110 mmol/L — ABNORMAL HIGH (ref 98–107)
BKR CO2: 19 mmol/L — ABNORMAL LOW (ref 20–30)
BKR CREATININE: 0.84 mg/dL (ref 0.40–1.30)
BKR EGFR, CREATININE (CKD-EPI 2021): >60 mL/min/{1.73_m2} (ref >=60)
BKR GLOBULIN: 2.9 g/dL (ref 2.0–3.9)
BKR GLUCOSE: 88 mg/dL (ref 70–100)
BKR POTASSIUM: 4.1 mmol/L (ref 3.3–5.3)
BKR PROTEIN TOTAL: 6.1 g/dL (ref 5.9–8.3)
BKR SODIUM: 142 mmol/L (ref 136–144)

## 2022-10-08 LAB — CBC WITH AUTO DIFFERENTIAL
BKR WAM ABSOLUTE NRBC (2 DEC): 0.15 x 1000/ÂµL (ref 0.00–1.00)
BKR WAM HEMATOCRIT (2 DEC): 24.1 % — ABNORMAL LOW (ref 38.50–50.00)
BKR WAM HEMOGLOBIN: 8 g/dL — ABNORMAL LOW (ref 13.2–17.1)
BKR WAM MCH (PG): 27.8 pg (ref 27.0–33.0)
BKR WAM MCHC: 33.2 g/dL (ref 31.0–36.0)
BKR WAM MCV: 83.7 fL (ref 80.0–100.0)
BKR WAM NUCLEATED RED BLOOD CELLS: 2.1 % — ABNORMAL HIGH (ref 0.0–1.0)
BKR WAM PLATELETS: 30 x1000/ÂµL — ABNORMAL LOW (ref 150–420)
BKR WAM RDW-CV: 16.1 % — ABNORMAL HIGH (ref 11.0–15.0)
BKR WAM RED BLOOD CELL COUNT.: 2.88 M/ÂµL — ABNORMAL LOW (ref 4.00–6.00)
BKR WAM WHITE BLOOD CELL COUNT: 7.2 x1000/ÂµL (ref 4.0–11.0)

## 2022-10-08 LAB — MANUAL DIFFERENTIAL
BKR WAM ATYPICAL LYMPHOCYTES (DIFF) 1 DEC: 2 % — ABNORMAL HIGH (ref 0.0–1.0)
BKR WAM BANDS (DIFF) (1 DEC): 7 % (ref 0.0–10.0)
BKR WAM BASOPHIL - ABS (DIFF) 2 DEC: 0 x 1000/ÂµL (ref 0.00–1.00)
BKR WAM BASOPHILS (DIFF): 0 % (ref 0.0–1.4)
BKR WAM EOSINOPHILS (DIFF) 2 DEC: 0 x 1000/ÂµL (ref 0.00–1.00)
BKR WAM EOSINOPHILS (DIFF): 0 % (ref 0.0–5.0)
BKR WAM LYMPHOCYTE - ABS (DIFF) 2 DEC: 0.07 x 1000/ÂµL — ABNORMAL LOW (ref 0.60–3.70)
BKR WAM LYMPHOCYTES (DIFF): 1 % — ABNORMAL LOW (ref 17.0–50.0)
BKR WAM METAMYELOCYTES (DIFF) 1 DEC: 3 % — ABNORMAL HIGH (ref 0.0–1.0)
BKR WAM MONOCYTE - ABS (DIFF) 2 DEC: 1.3 x 1000/ÂµL — ABNORMAL HIGH (ref 0.00–1.00)
BKR WAM MONOCYTES (DIFF): 18 % — ABNORMAL HIGH (ref 4.0–12.0)
BKR WAM MYELOCYTES (DIFF) 1 DEC: 2 % — ABNORMAL HIGH (ref 0.0–0.0)
BKR WAM NEUTROPHILS (DIFF): 67 % (ref 39.0–72.0)
BKR WAM NEUTROPHILS - ABS (DIFF) 2 DEC: 5.35 x 1000/ÂµL (ref 2.00–7.60)

## 2022-10-08 LAB — TACROLIMUS LEVEL     (BH GH L LMW YH): BKR TACROLIMUS BLOOD: 8.3 ng/mL

## 2022-10-08 LAB — IMMATURE PLATELET FRACTION (BH GH LMW YH)
BKR WAM IPF, ABSOLUTE: 4.7 x1000/ÂµL (ref <20.0)
BKR WAM IPF: 15.5 % — ABNORMAL HIGH (ref 1.2–8.6)

## 2022-10-08 LAB — MAGNESIUM: BKR MAGNESIUM: 1.6 mg/dL — ABNORMAL LOW (ref 1.7–2.4)

## 2022-10-08 LAB — TRIGLYCERIDES: BKR TRIGLYCERIDES: 207 mg/dL — ABNORMAL HIGH

## 2022-10-08 NOTE — Progress Notes
SOCIAL WORK NOTEPatient Name: Douglas Ostergard BennettMedical Record Number: [id number] Date of Birth: 1952/05/21Medical Social Work Follow Up  [employer] Most Recent Value Admission Information  Document Type Progress Note Reason for Current Social Work Regulatory affairs officer of Information Patient, Audiological scientist Comment wife, Okey Regal Record Reviewed Yes Level of Care Inpatient What medium(s) of communication were used with patient/family/caregiver? Face-to-Face / In-Person, Telephone Psychosocial issues requiring intervention [facility] Psychosocial interventions 10 minutes in calls with wife and visit with Douglas [name]. Douglas [name] advised me that the date of discharge is being changed from 8/29 to 8/26. I assisted with revision of the [facility] [employer] application (8/26-8/30) and the [place] discount application (8/30-9/6.) [facility]ations hematology, Larae Grooms, MSN & Lynford Citizen, LCSW Manager Oncology, [facility] [place] Specific referrals to enhance community supports (include existing and new resources) above Handoff Required? No Next Steps/Plan (including hand-off): Social work intervention complete.  Mr. & Douglas [name] are aware that we will complete the discount application weekly. Please consult social work if needed. Signature: Huntley Estelle, LCSW Contact Information: [phone]

## 2022-10-08 NOTE — Progress Notes
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 Santor states he is feeling fine overall,still very fatigued, remains afebrile, no shortness of breath or chest pain. Appetite slightly improving, eat more, however he is not drinking much because water makes him nauseated per pt report.no vomiting,  diarrhea improving, no new rash or bleeding noted.Review of Systems Constitutional:  Positive for malaise/fatigue. Negative for chills and fever. HENT: Negative.  Negative for sore throat.  Eyes: Negative.  Respiratory: Negative.  Negative for cough, sputum production, shortness of breath and wheezing.  Cardiovascular: Negative.  Negative for chest pain, palpitations and leg swelling. Gastrointestinal:  Positive for diarrhea (improving). Negative for abdominal pain, constipation, nausea and vomiting. Genitourinary:  Negative for dysuria, frequency and urgency. Musculoskeletal: Negative.  Skin:  Positive for rash. Negative for itching.      Toes discolored - not new Neurological: Negative.  Psychiatric/Behavioral: Negative.   Review of Allergies/Meds/Hx: Allergies Allergen Reactions  Voriconazole Other (See Comments)   Gingival edema/redness making it difficult to bite down  Medications:Current Facility-Administered Medications:   acetaminophen (TYLENOL) tablet 650 mg, 650 mg, Oral, Q6H PRN, Flonnie Hailstone, Marsheila Alejo, PA  acyclovir (ZOVIRAX) tablet 400 mg, 400 mg, Oral, BID, Schyler Butikofer, PA, 400 mg at 10/08/22 1010  albuterol neb sol 2.5 mg/3 mL (0.083%) (PROVENTIL,VENTOLIN), 2.5 mg, Nebulization, Q30 MIN PRN, Chrystine Oiler, MD  amLODIPine (NORVASC) tablet 10 mg, 10 mg, Oral, Daily, Rall, Kerri A, PA, 10 mg at 10/08/22 1010  chlorhexidine gluconate (PERIDEX) 0.12 % solution 15 mL, 15 mL, Mouth/Throat, BID, Al-Musa, Amer, MD, 15 mL at 10/08/22 1014  clindamycin (CLEOCIN T) 1 % external solution, , Topical (Top), BID, Rall, Kerri A, [place], Given at 10/07/22 2154  diphenhydrAMINE (BENADRYL) injection 50 mg, 50 mg, IV Push, Once PRN, Chrystine Oiler, MD  diphenoxylate-atropine (LOMOTIL) 2.5-0.025 mg per tablet 1 tablet, 1 tablet, Oral, 4x DAILY PRN, Roetta Sessions, MD, 1 tablet at 10/06/22 1202  EPINEPHrine (EPI-PEN) auto-injector 0.3 mg, 0.3 mg, Intramuscular, Q15 MIN PRN, Chrystine Oiler, MD  EPINEPHrine (EPI-PEN) auto-injector 0.3 mg, 0.3 mg, Intramuscular, Q15 MIN PRN, Chrystine Oiler, MD  erythromycin (ROMYCIN) 5 mg/gram (0.5 %) ophthalmic ointment, , Left Eye, Nightly, Al-Musa, Denyse Dago, MD, Given at 10/07/22 2152  famotidine (PF) (PEPCID) 20 mg in sodium chloride 0.9% PF 5 mL Injection, 20 mg, IV Push, Once PRN, Chrystine Oiler, MD  finasteride (PROSCAR) tablet 5 mg, 5 mg, Oral, Daily, Al-Musa, Amer, MD, 5 mg at 10/08/22 1009  hydrocortisone sodium succinate (PF) (Solu-CORTEF) injection 100 mg, 100 mg, IV Push, Once PRN, Chrystine Oiler, MD  isavuconazonium (Cresemba) capsule Cap 372 mg, 372 mg, Oral, Daily, Chrystine Oiler, MD, 372 mg at 10/08/22 1010  latanoprost (XALATAN) 0.005 % ophthalmic solution 1 drop, 1 drop, Both Eyes, Nightly, Al-Musa, Amer, MD, 1 drop at 10/07/22 2151  loperamide (IMODIUM) capsule 2 mg, 2 mg, Oral, 4x DAILY PRN, Roetta Sessions, MD, 2 mg at 10/08/22 0749  LORazepam (ATIVAN) tablet 0.5 mg, 0.5 mg, Oral, Q6H PRN, Chrystine Oiler, MD  magnesium oxide (MAG-OX) tablet 400 mg, 400 mg, Oral, BID, Krissie Merrick, PA, 400 mg at 10/08/22 1010  magnesium  sulfate in water 2 gram/50 mL (4 %) (IVPB) 2 g, 2 g, Intravenous, Q1H PRN **OR** magnesium sulfate in water 4 gram/100 mL (4 %) sterile water IVPB 4 g, 4 g, Intravenous, Q2H PRN **OR** magnesium sulfate in water 2 gram/50 mL (4 %) (IVPB) 2 g, 2 g, Intravenous, Q1H PRN, Pugliese, Amy Barile, APRN, Last Rate: 50 mL/hr at 10/06/22 1033, 2 g at 10/06/22 1033  normal saline 0.9 % oral rinse 30 mL, 30 mL, Oral, 4x DAILY PRN, William Hamburger, MD  ondansetron (PF) [facility]) injection 8 mg, 8 mg, IV Push, Q12H PRN, Flonnie Hailstone, Maryuri Warnke, PA, 8 mg at 10/07/22 1345  potassium chloride in sterile water 20 mEq/100 mL IVPB 20 mEq, 20 mEq, Intravenous, Q1H PRN, Magdalene River, MD, Last Rate: 100 mL/hr at 10/07/22 0958, 20 mEq at 10/07/22 0958  potassium chloride in sterile water 20 mEq/50 mL IVPB 20 mEq, 20 mEq, Intravenous, Q1H PRN, Magdalene River, MD  prochlorperazine edisylate (COMPAZINE) injection 10 mg, 10 mg, IV Push, Q6H PRN, Chrystine Oiler, MD, 10 mg at 10/05/22 2053  sodium chloride 0.9 % (new bag) bolus 500 mL, 500 mL, Intravenous, Once PRN, Chrystine Oiler, MD  sodium chloride 0.9 % flush 10 mL, 10 mL, IV Push, PRN for [facility], Chrystine Oiler, MD, 20 mL at 09/30/22 0939  sodium chloride 0.9 % flush 3 mL, 3 mL, IV Push, Q8H, Al-Musa, Amer, MD, 3 mL at 10/08/22 0600  sodium chloride 0.9 % flush 3 mL, 3 mL, IV Push, PRN for [facility], Roetta Sessions, MD  sodium chloride 0.9% infusion, 5 mL/hr, Intravenous, PRN, Chrystine Oiler, MD  tacrolimus (PROGRAF) Immediate Release capsule 6.5 mg, 6.5 mg, Oral, Q12H (0600-1800), Nassar, Amin, MD, 6.5 mg at 10/08/22 0600  ursodioL (URSO 250) tablet 500 mg, 500 mg, Oral, BID, Chrystine Oiler, MD, 500 mg at 10/08/22 1010Objective: Temp:  [97.2 ?F (36.2 ?C)-98.1 ?F (36.7 ?C)] 97.8 ?F (36.6 ?C)Pulse:  [77-85] 77Resp:  [18] 18BP: (125-137)/(68-75) 130/70SpO2:  [95 %-97 %] 97 %I/O's:Gross Totals (Last 24 hours) at 10/08/2022 1055Last data filed at 10/08/2022 0607Intake 1497 ml Output 400 ml Net 1097 ml Wt: 10/08/22 102 kg 09/16/22 104.2 kg 09/13/22 104.3 kg 09/11/22 105.9 kg 09/09/22 105.4 kg 09/04/22 106.4 kg Physical ExamConstitutional:     General: He is not in acute distress.HENT:    Nose: Nose normal.    Mouth/Throat:    Mouth: Mucous membranes are moist.    Pharynx: Oropharynx is clear. No oropharyngeal exudate. Eyes:    Conjunctiva/sclera: Conjunctivae normal. Cardiovascular:    Rate and Rhythm: Normal rate and regular rhythm.    Heart sounds: Normal heart sounds. No murmur heard.Pulmonary:    Effort: Pulmonary effort is normal.    Breath sounds: Normal breath sounds. No wheezing. Abdominal:    General: Abdomen is flat. Bowel sounds are normal.    Palpations: Abdomen is soft.    Tenderness: There is no abdominal tenderness. Musculoskeletal:    Right lower leg: No edema.    Left lower leg: No edema. Skin:   General: Skin is warm.    Findings: Rash: Mild erythema / edema of left eye lid; overall significantly improved; mild erythema over forehead.    Comments: Toes hyperpigmetned/?PVD - not newBruises, petechiae scattered Neurological:    Mental Status: He is alert and oriented to person, place, and time. Mental status is at baseline. Psychiatric:       Mood and Affect: Mood normal. Labs:Recent Results (from the past 24 hour(s)) Magnesium  Collection Time: 10/08/22  5:57 AM Result Value Ref Range  Magnesium 1.6 (L) 1.7 - 2.4 mg/dL Triglycerides  Collection Time: 10/08/22  5:57 AM Result Value Ref Range  Triglycerides 207 (H) See Comment mg/dL CBC auto differential  Collection Time: 10/08/22  5:57 AM Result Value Ref Range  WBC 7.2 4.0 - 11.0 x1000/?L  RBC 2.88 (L) 4.00 - 6.00 M/?L  Hemoglobin 8.0 (L) 13.2 - 17.1 g/dL  Hematocrit 62.13 (L) 08.65 - 50.00 %  MCV 83.7 80.0 - 100.0 fL  MCH 27.8 27.0 - 33.0 pg  MCHC 33.2 31.0 - 36.0 g/dL  RDW-CV 78.4 (H) 69.6 - 15.0 %  Platelets 30 (L) 150 - 420 x1000/?L  MPV    nRBC 2.1 (H) 0.0 - 1.0 %  Absolute nRBC 0.15 0.00 - 1.00 x 1000/?L Comprehensive metabolic panel  Collection Time: 10/08/22  5:57 AM Result Value Ref Range  Sodium 142 136 - 144 mmol/L  Potassium 4.1 3.3 - 5.3 mmol/L  Chloride 110 (H) 98 - 107 mmol/L  CO2 19 (L) 20 - 30 mmol/L  Anion Gap 13 7 - 17  Glucose 88 70 - 100 mg/dL  BUN 12 8 - 23 mg/dL  Creatinine 2.95 2.84 - 1.30 mg/dL  Calcium 8.7 (L) 8.8 - 10.2 mg/dL  BUN/Creatinine Ratio 13.2 8.0 - 23.0  Total Protein 6.1 5.9 - 8.3 g/dL  Albumin 3.2 (L) 3.6 - 5.1 g/dL  Total Bilirubin 0.4 <=4.4 mg/dL  Alkaline Phosphatase 010 9 - 122 U/L  Alanine Aminotransferase (ALT) 18 9 - 59 U/L  Aspartate Aminotransferase (AST) 29 10 - 35 U/L  Globulin 2.9 2.0 - 3.9 g/dL  A/G Ratio 1.1 1.0 - 2.2  AST/ALT Ratio 1.6 Reference Range Not Established  eGFR (Creatinine) >60 >=60 mL/min/1.53m2 Manual Differential  Collection Time: 10/08/22  5:57 AM Result Value Ref Range  Neutrophils 67.0 39.0 - 72.0 %  Bands 7.0 0.0 - 10.0 %  Lymphocytes 1.0 (L) 17.0 - 50.0 %  Monocytes 18.0 (H) 4.0 - 12.0 %  Eosinophils 0.0 0.0 - 5.0 %  Basophil 0.0 0.0 - 1.4 %  Metamyelocytes 3.0 (H) 0.0 - 1.0 %  Myelocytes 2.0 (H) 0.0 - 0.0 %  Atypical Lymphocytes 2.0 (H) 0.0 - 1.0 %  Neutrophils Absolute 5.35 2.00 - 7.60 x 1000/?L  Lymphocyte Absolute 0.07 (L) 0.60 - 3.70 x 1000/?L  Monocyte Absolute Count 1.30 (H) 0.00 - 1.00 x 1000/?L  Eosinophil Absolute Count 0.00 0.00 - 1.00 x 1000/?L  Basophil Absolute Count 0.00 0.00 - 1.00 x 1000/?L  RBC Morphology Reviewed   Polychromasia 1+ (A) None Immature Platelet Fraction (BH GH LMW YH)  Collection Time: 10/08/22  5:57 AM Result Value Ref Range  Immature Platelet Fraction 15.5 (H) 1.2 - 8.6 %  Absolute Immature Platelet Fraction 4.7 <20.0 x1000/?L Diagnostics:No results found.Assessment: Douglas [name] is a 72 y.o. 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 He is s/p 2 cycles of chemotherapy with Decitabine + Venetoclax with reduction in blast count. 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  Plan:  Day +14 (10/08/19): fatigued, remains afebrile, no transfusion required today.Continue supportive care. Plan for discharge in one to two days, Conditioning/Transplant:-Melphalan 70 mg/m2 over 30 minutes on D-6, *Melphalan dosed reduced to 70 mg/m2 due to age-Fludarabine 40 mg/m2 over 30 minutes every 24hr for 4 doses from D-5 to D -2             -  PT cyclophosphamide 25 mg/kg IV over 1.5 hrs every 24hr for 2 doses on D +3 & D +4 (reduced by 50% to 25 mg/kg due 10/10 match and age)-Mesna 50 mg/kg CIVI daily, for 2 doses, starting 15 minutes before cyclophosphamide on D +3 & D +4-Stem cell infusion day 0 (09/24/22) HEME:  - Transfuse pRBCs to maintain Hb >7 g/dL, platelets >16 or if clinically indicated- Filgrastim 71mcg/kg started on day +5 and discontinued on day+13 with stable engraftment of neutrophils.ID: Prophylactic antimicrobials: Acyclovir 400 mg PO TID (home medications),  Zosyn dc'd 8/21 with engraftment, Isavuconazole 372 mg po daily (prior authorization pending, voriconazole allergy-gingival hyperplasia), Pentamidine 300 mg inhalation monthly- CMV -/-, Check CMV PCR weekly.- If febrile, panculture, UA and CXR, start broad spectrum empiric antibiotics if neutropenic or septic appearing.   - Repeat blood cx every 24 hours with temperature > 100.4 or septic appearing GVHD Prophylaxis:  - currently on Tacrolimus 6.5mg  PO BID. level pending today, adjust dose for goal of 6-11. - PT cyclophosphamide 25 mg/kg IV over 1.5 hrs every 24hr for 2 doses on D +3 & D +4 with mesna daily on Day +3 and Day +4  (PTCy dose reduced by 50% )- Monitor levels, haptoglobin, LDH, triglycerides.- Daily skin exam, liver function tests twice weekly (minimum), document stool output/day. Fluids, Electrolytes and Nutrition:encouraged nutrition supplement- Regular diet, consider nutrition consult if needed- Daily weights- Strict Intake and output- Replete electrolytes PRNEmesis prophylaxis: Ondansetron 16 mg PO every 12 hours w/ PRN Compazine, Dexamethasone 12mg  po premed prior to melphalan High dose cyclophosphamide: doxepin premed 25 mg and diphenhydramine 25 mg 30 mins before Cytoxan; D5W 1/2NS + KCL 20 meq + Magnesium 1 gm alternating with D5W + of NaCHO3 + KCL 20 meq until 24 hours after last Cyclophosphamide dose (hold fluid during Cytoxan infusion)Diarrhea: imodium 2mg  PRN 4 times daily, lomotil 2.5-0.025 4x daily PRN  VOD: - Continue Ursodiol 500mg  PO BID, continue until Day +90- Daily weights, strict fluid monitoring of all intake/output- Monitor liver function tests and coags at least twice weekly HTN: -Patient reports not taking his antiHTN meds for the last few days due to feeling weak . Restarted during hospital course in setting of elevated BP-Increased Norvasc to 10mg  8/17 in the setting of SBP 150s-160s. Continue to monitor. 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 improving2) Forehead erythema and left eyelid erythema and swelling with purpura on eyelid - improving so will hold off on bx per derm, felt to be hordeolum, however, given pt counts will continue empiric tx for cellulitis, improving- warm compresses PRN for left eye + started erythromycin ointment for possible rosacea  CVAD: [facility] for chemotherapy and supportive care Discharge Planning: PT consult  ordered 8/20, 24 hour caregiver: wifeSigned:Mairim Bade Flonnie Hailstone, [phone]

## 2022-10-08 NOTE — Plan of Care
Plan of Care Overview/ Patient Status    1900-0700: Patient was awake when received to care. No complaints made. Vital signs stable on room air. Quiet and healing environment provided. Needs attended. Safety and universal fall precautions maintained. Call light and personal needs within reach. Will continue to monitor. Refer to flow sheet.

## 2022-10-08 NOTE — Progress Notes
Integrative MedicineProgress NoteInpatient Massage: Integrative Medicine Patient AssessmentSubjective Mr. DEERIC KNAUF is a 72 y.o. male who presents today with a goal of receiving massage therapy for relaxation. Mr. Bregman was described treatment and consented to goals of treatment.Objective Patient and practitioner discussed goals of treatment and any contraindications for massage therapy.Assessment:Patient received massage therapy to address patient's goals, practitioner avoided any contraindicated areas.Patient received massage to the following areas: feetPressure was kept at a level 1Appointment Review:Mr. Rodena Goldmann received treatment for 30 minutes. Mode of Therapy: Therapeutic MassagePressure applied during massage therapy. (Scale of 1-10 with 1 being light touch and 10 being deep tissue): 1 Disease Teams: HematologyPatient Location: NP 11Referred by: RNService provided to: PatientService Provided By: Joni Reining LottPatient Status: new Length of Appointment: 30 minutes

## 2022-10-08 NOTE — Plan of Care
Plan of Care Overview/ Patient Status    0700-1930Pt received A&Ox4, VSS, afebrile. Pt independent and ambulatory in room, today reported feeling weaker and requested 1 assist for shower. RDL [facility]man access 8/18, flushes easily w/ +BR, dressing CDI. Mag 1.6 this a.m., repleted with 1 2g bag IV Magnesium administered per MAR. 1 episode of emesis this a.m., mostly mucus, prn Zofran administered. Last BM 8/21, poor appetite, intermittent nausea. Skin check completed, skin intact. Safety maintained, call bell within reach. Please see flowsheets for further details.Ross Marcus RN

## 2022-10-08 NOTE — Plan of Care
Inpatient Physical Therapy Progress Note IP Adult PT Eval/Treat - 10/08/22 0841    Date of Visit / Treatment  Date of Visit / Treatment 10/08/22   Start Time 0801   End Time 0841   Total Treatment Time 40    General Information  Subjective I think I'll use a shower chair when I go home   General Observations Pt seated at EOB, on NP11 oncology   Precautions/Limitations Fall Precautions    Vital Signs and Orthostatic Vital Signs  Vital Signs Vital Signs Stable   Vital Signs Free text on RA    Pain/Comfort  Location #1 - PreTreatment Rating (Numbers Scale) 0/10 - no pain   Posttreatment Rating (Numbers Scale) 0/10 - no pain    Patient Coping  Observed Emotional State cooperative    Cognition  Overall Cognitive Status Within Functional Limits   Level of Consciousness alert    Range of Motion  Range of Motion Examination bilateral upper extremity ROM was WFL;bilateral lower extremity ROM was [facility]    Musculoskeletal  LUE Muscle Strength Grading 4-->active movement against gravity and resistance   RUE Muscle Strength Grading 4-->active movement against gravity and resistance   LLE Muscle Strength Grading 4-->active movement against gravity and resistance   RLE Muscle Strength Grading 4-->active movement against gravity and resistance    Muscle Tone  Muscle Tone Testing Results No muscle tone deficits noted    Sensory Assessment  Sensory Tests Results No sensory impairment noted    Skin Assessment  Skin Assessment See Nursing Documentation    Balance  Sitting Balance: Static  GOOD-    Maintains static position against minimal resistance with no Assistive Device   Sitting Balance: Dynamic  GOOD-   Independent in dynamic balance activities (ambulates on level surfaces with no Assistive Device, picks up object from floor in sitting)   Standing Balance: Static FAIR+     Maintains static position without assist or device, may require Supervision or Verbal Cues (>2 minutes)   Standing Balance: Dynamic  FAIR+    Performs dynamic activities through full range with Supervision   Balance Assist Device Rolling walker    Sit-Stand Transfer Training  Sit-to-Stand Transfer Independence/Assistance Level Supervision   Sit-to-Stand Transfer Assist Device Rolling walker   Stand-to-Sit Transfer Independence/Assistance Level Supervision   Stand-to-Sit Transfer Assist Device Rolling walker    Gait Training  Independence/Assistance Level  Supervision   Assistive Device  Rolling walker   Gait Distance 400 feet   Gait Pattern Analysis step through gait   Gait Training Comments Pt reporting he felt he needed external support from RW    Stair Performance  Number of Stairs 13   Stair Railings present on both sides   Independence/Assistance Level  Stand-by assist   Assistive Device Rail   Stair Performance Comment Pt using B rails for support, step-through pattern    Handoff Documentation  Handoff Patient in bed;Discussed with nursing    PT- AM-PAC - Basic Mobility Screen- How much help from another person do you currently need.....  Turning from your back to your side while in a a flat bed without using rails? 4 - None - Does not require any help and does the activity independently. Can use assistive devices.   Moving from lying on your back to sitting on the side of a flat bed without using bed rails? 4 - None - Does not require any help and does the activity independently. Can use assistive devices.   Moving to  and from a bed to a chair (including a wheelchair)? 3 - A Little - Requires a little help (supervision, minimal assistance). Can use assistive devices.   Standing up from a chair using your arms(e.g., wheelchair or bedside chair)? 3 - A Little - Requires a little help (supervision, minimal assistance). Can use assistive devices.   To walk in a hospital room? 3 - A Little - Requires a little help (supervision, minimal assistance). Can use assistive devices.   Climbing 3-5 steps with a railing? 3 - A Little - Requires a little help (supervision, minimal assistance). Can use assistive devices.   AMPAC Mobility Score 20   TARGET Highest Level of Mobility Mobility Level 6, Walk 10+steps   ACTUAL Highest Level of Mobility Mobility Level 8, Walk 250+ feet    Therapeutic Exercise  Therapeutic Exercise Comments Pt education on ther-ex to perform throughout day    Neuromuscular Re-education  Neuromuscular Re-education Comments Standing balance training w RW support    Therapeutic Functional Activity  Therapeutic Functional Activity Comments Reviewed discharge planning, DME for home, reasoning behind not needing a commode at this time, encouraged to ambulate to a bathroom rather than have at bedside. Encouraged use of shower chair, demonstrated how to set up and how to get in/out of a bathtub at home with use    Clinical Impression  Follow up Assessment 10/08/22: Patient presented as alert and agreeable, motivated to mobilize. Reported feeling significant weakness, performed stairs while holding onto rails and ambulating w RW. Discussed that during and following cancer treatments is not abnormal to feel fatigued. Pt reporting not eating much due to nausea, discussed that a lack of nutrition is also linked to weakness. Encouraged to ambulate with staff and family in the hall during day. Pt will continue to benefit from skilled PT to address impairments. Recommended home w RW and family support, home PT    Patient/Family Stated Goals  Patient/Family Stated Goal(s) take care of self    Frequency/Equipment Recommendations  PT Frequency 3x per week   Next Treatment Expected 10/09/22   PT/PTA completing this assessment [facility]    PT Recommendations for Inpatient Admission  Activity/Level of Assist ambulate;assist of 1;with rolling walker;in hall    Planned Treatment / Interventions  Plan for Next Visit Progress as tolerated    PT Discharge Summary  Physical Therapy Disposition Recommendation Home   Additional Physical Therapy Disposition Recommendations Home Physical Therapy;Home with family support   Equipment Recommendations for Discharge Levan Hurst - The patient will use a Rolling Walker in the home and outside daily to provide greater stability and safer ambulation for participation in ADLs     Arlyss Gandy, DPT, PT

## 2022-10-09 ENCOUNTER — Encounter: Admit: 2022-10-09 | Payer: PRIVATE HEALTH INSURANCE | Attending: Medical

## 2022-10-09 DIAGNOSIS — Z9484 Stem cells transplant status: Secondary | ICD-10-CM

## 2022-10-09 LAB — COMPREHENSIVE METABOLIC PANEL
BKR A/G RATIO: 1.1 (ref 1.0–2.2)
BKR ALANINE AMINOTRANSFERASE (ALT): 21 U/L (ref 9–59)
BKR ALBUMIN: 3.2 g/dL — ABNORMAL LOW (ref 3.6–5.1)
BKR ALKALINE PHOSPHATASE: 101 U/L (ref 9–122)
BKR ANION GAP: 11 (ref 7–17)
BKR ASPARTATE AMINOTRANSFERASE (AST): 29 U/L (ref 10–35)
BKR AST/ALT RATIO: 1.4
BKR BILIRUBIN TOTAL: 0.4 mg/dL (ref <=1.2)
BKR BLOOD UREA NITROGEN: 11 mg/dL (ref 8–23)
BKR BUN / CREAT RATIO: 13.3 (ref 8.0–23.0)
BKR CALCIUM: 8.7 mg/dL — ABNORMAL LOW (ref 8.8–10.2)
BKR CHLORIDE: 109 mmol/L — ABNORMAL HIGH (ref 98–107)
BKR CO2: 21 mmol/L (ref 20–30)
BKR CREATININE: 0.83 mg/dL (ref 0.40–1.30)
BKR EGFR, CREATININE (CKD-EPI 2021): >60 mL/min/{1.73_m2} (ref >=60)
BKR GLOBULIN: 3 g/dL (ref 2.0–3.9)
BKR GLUCOSE: 98 mg/dL (ref 70–100)
BKR POTASSIUM: 3.8 mmol/L (ref 3.3–5.3)
BKR PROTEIN TOTAL: 6.2 g/dL (ref 5.9–8.3)
BKR SODIUM: 141 mmol/L (ref 136–144)

## 2022-10-09 LAB — CBC WITH AUTO DIFFERENTIAL
BKR WAM ABSOLUTE IMMATURE GRANULOCYTES.: 0.46 x 1000/ÂµL — ABNORMAL HIGH (ref 0.00–0.30)
BKR WAM ABSOLUTE LYMPHOCYTE COUNT.: 0.12 x 1000/ÂµL — ABNORMAL LOW (ref 0.60–3.70)
BKR WAM ABSOLUTE NRBC (2 DEC): 0.12 x 1000/ÂµL (ref 0.00–1.00)
BKR WAM ANC (ABSOLUTE NEUTROPHIL COUNT): 2.78 x 1000/ÂµL (ref 2.00–7.60)
BKR WAM BASOPHIL ABSOLUTE COUNT.: 0.02 x 1000/ÂµL (ref 0.00–1.00)
BKR WAM BASOPHILS: 0.4 % (ref 0.0–1.4)
BKR WAM EOSINOPHIL ABSOLUTE COUNT.: 0 x 1000/ÂµL (ref 0.00–1.00)
BKR WAM EOSINOPHILS: 0 % (ref 0.0–5.0)
BKR WAM HEMATOCRIT (2 DEC): 23.6 % — ABNORMAL LOW (ref 38.50–50.00)
BKR WAM HEMOGLOBIN: 7.9 g/dL — ABNORMAL LOW (ref 13.2–17.1)
BKR WAM IMMATURE GRANULOCYTES: 8.7 % — ABNORMAL HIGH (ref 0.0–1.0)
BKR WAM LYMPHOCYTES: 2.3 % — ABNORMAL LOW (ref 17.0–50.0)
BKR WAM MCH (PG): 28 pg (ref 27.0–33.0)
BKR WAM MCHC: 33.5 g/dL (ref 31.0–36.0)
BKR WAM MCV: 83.7 fL (ref 80.0–100.0)
BKR WAM MONOCYTE ABSOLUTE COUNT.: 1.92 x 1000/ÂµL — ABNORMAL HIGH (ref 0.00–1.00)
BKR WAM MONOCYTES: 36.2 % — ABNORMAL HIGH (ref 4.0–12.0)
BKR WAM NEUTROPHILS: 52.4 % (ref 39.0–72.0)
BKR WAM NUCLEATED RED BLOOD CELLS: 2.3 % — ABNORMAL HIGH (ref 0.0–1.0)
BKR WAM PLATELETS: 33 x1000/ÂµL — ABNORMAL LOW (ref 150–420)
BKR WAM RDW-CV: 16.3 % — ABNORMAL HIGH (ref 11.0–15.0)
BKR WAM RED BLOOD CELL COUNT.: 2.82 M/ÂµL — ABNORMAL LOW (ref 4.00–6.00)
BKR WAM WHITE BLOOD CELL COUNT: 5.3 x1000/ÂµL (ref 4.0–11.0)

## 2022-10-09 LAB — IMMATURE PLATELET FRACTION (BH GH LMW YH)
BKR WAM IPF, ABSOLUTE: 4.3 x1000/ÂµL (ref <20.0)
BKR WAM IPF: 13.1 % — ABNORMAL HIGH (ref 1.2–8.6)

## 2022-10-09 LAB — HAPTOGLOBIN: BKR HAPTOGLOBIN: 283 mg/dL — ABNORMAL HIGH (ref 30–200)

## 2022-10-09 LAB — LACTATE DEHYDROGENASE: BKR LACTATE DEHYDROGENASE: 618 U/L — ABNORMAL HIGH (ref 122–241)

## 2022-10-09 LAB — TACROLIMUS LEVEL     (BH GH L LMW YH): BKR TACROLIMUS BLOOD: 8.5 ng/mL

## 2022-10-09 LAB — PT/INR AND PTT (BH GH L LMW YH)
BKR INR: 1.25 — ABNORMAL HIGH (ref 0.86–1.12)
BKR PARTIAL THROMBOPLASTIN TIME: 27.9 s (ref 23.0–31.4)
BKR PROTHROMBIN TIME: 13.7 s — ABNORMAL HIGH (ref 9.6–12.3)

## 2022-10-09 LAB — CYTOMEGALOVIRUS QUANTITATIVE PCR, PLASMA     (BH GH L LMW YH): BKR CMV QUANTITATIVE PCR: NOT DETECTED

## 2022-10-09 MED ORDER — MISCELLANEOUS MEDICAL SUPPLY MISC
1 refills | Status: AC
Start: 2022-10-09 — End: ?

## 2022-10-09 NOTE — Plan of Care
Plan of Care Overview/ Patient Status    Plan to discharge to [facility] tomorrow with spouse. [facility] secured for SN and PT. BA/RN to fax W10. Provider to enter [id number]. Met with patient to discuss discharge plan. IM obtained. Spouse will provide transportation. RW delivered to bedside. Case Management Plan  Flowsheet Row Most Recent Value Discharge Planning  Patient/Patient Representative goals/treatment preferences for discharge are:  Discharge to [facility] Patient/Patient Representative was presented with a list of facilities, agencies and/or dme providers and Referral(s) placed for: Homecare services Home Health Care Services Required Nursing, Physical Therapy [facility] Mode of Transportation  Private car  (add comment for special considerations) Patient accompanied by Spouse CM D/C Readiness  PASRR completed and approved N/A Authorization number obtained, if required N/A Is there a 3 day INPATIENT Qualifying stay for Medicare Patients? N/A Medicare IM- signed, dated, timed and scanned, if required Yes DME Authorized/Delivered Yes No needs identified/ follow up with PCP/MD N/A Post acute care services secured W10 complete Yes Pri Completed and Accepted  N/A Is the destination address correct on the W10 Yes Finalized Plan  Expected Discharge Date 10/10/22 Discharge Disposition Home-Health Care Svc  Tanna Furry BSN, RNCare Manager [phone]

## 2022-10-09 NOTE — Plan of Care
Inpatient Physical Therapy Progress Note IP Adult PT Eval/Treat - 10/09/22 0831    Date of Visit / Treatment  Date of Visit / Treatment 10/09/22   Start Time 0801   End Time 0831   Total Treatment Time 30    General Information  Subjective I'm faster without the walker   General Observations Pt supine, on [facility] oncology   Precautions/Limitations Fall Precautions    Vital Signs and Orthostatic Vital Signs  Vital Signs Vital Signs Stable   Vital Signs Free text on RA    Pain/Comfort  Location #1 - PreTreatment Rating (Numbers Scale) 0/10 - no pain   Posttreatment Rating (Numbers Scale) 0/10 - no pain    Patient Coping  Observed Emotional State cooperative    Cognition  Overall Cognitive Status Within Functional Limits   Level of Consciousness alert    Range of Motion  Range of Motion Examination bilateral upper extremity ROM was WFL;bilateral lower extremity ROM was [facility]    Musculoskeletal  LUE Muscle Strength Grading 4-->active movement against gravity and resistance   RUE Muscle Strength Grading 4-->active movement against gravity and resistance   LLE Muscle Strength Grading 4-->active movement against gravity and resistance   RLE Muscle Strength Grading 4-->active movement against gravity and resistance    Muscle Tone  Muscle Tone Testing Results No muscle tone deficits noted    Sensory Assessment  Sensory Tests Results No sensory impairment noted    Skin Assessment  Skin Assessment See Nursing Documentation    Balance  Sitting Balance: Static  GOOD-    Maintains static position against minimal resistance with no Assistive Device   Sitting Balance: Dynamic  GOOD-   Independent in dynamic balance activities (ambulates on level surfaces with no Assistive Device, picks up object from floor in sitting)   Standing Balance: Static FAIR       Maintains static position without assist or device, may require Supervision or Verbal Cues (<2 minutes)   Standing Balance: Dynamic  FAIR-     Performs dynamic activities through partial range (50-75%) with Contact Guard   Balance Assist Device No device    Bed Mobility  Supine-to-Sit Independence/Assistance Level Supervision   Supine-to-Sit Assist Device Head of bed elevated   Sit-to-Supine Independence/Assistance Level Supervision    Sit-Stand Transfer Training  Sit-to-Stand Transfer Independence/Assistance Level Supervision   Sit-to-Stand Transfer Assist Device Rolling walker   Stand-to-Sit Transfer Independence/Assistance Level Supervision   Stand-to-Sit Transfer Assist Device No device    Gait Training  Independence/Assistance Level  Stand-by assist   Assistive Device  No device   Gait Distance 400 feet;200 feet   Gait Training Comments Pt ambulating w RW first and cautious with step-through pattern. Removed RW and pt ambulating with larger steps, faster gait speed and increased arm swing. Discussed to not use RW for now, but beneficial to have for when fatigued, tired    Handoff Documentation  Handoff Patient in bed;Discussed with nursing    PT- AM-PAC - Basic Mobility Screen- How much help from another person do you currently need.....  Turning from your back to your side while in a a flat bed without using rails? 4 - None - Does not require any help and does the activity independently. Can use assistive devices.   Moving from lying on your back to sitting on the side of a flat bed without using bed rails? 3 - A Little - Requires a little help (supervision, minimal assistance). Can use assistive devices.   Moving  to and from a bed to a chair (including a wheelchair)? 3 - A Little - Requires a little help (supervision, minimal assistance). Can use assistive devices.   Standing up from a chair using your arms(e.g., wheelchair or bedside chair)? 3 - A Little - Requires a little help (supervision, minimal assistance). Can use assistive devices.   To walk in a hospital room? 3 - A Little - Requires a little help (supervision, minimal assistance). Can use assistive devices.   Climbing 3-5 steps with a railing? 3 - A Little - Requires a little help (supervision, minimal assistance). Can use assistive devices.   AMPAC Mobility Score 19   TARGET Highest Level of Mobility Mobility Level 6, Walk 10+steps   ACTUAL Highest Level of Mobility Mobility Level 8, Walk 250+ feet    Neuromuscular Re-education  Neuromuscular Re-education Comments Standing dynamic balance training with railing support    Therapeutic Functional Activity  Therapeutic Functional Activity Comments Discussed DME use, when to/not to use RW, how to tailor activity towards energy level, home services    Clinical Impression  Follow up Assessment 10/09/22: Patient presented as alert and agreeable, motivated to participate. Reports increased fatigue, however cautious with RW use. Removed RW and pt able to take larger steps and support himself, despite feeling more unsteady was able to increase gait speed. Discussed using RW depending on energy level, but encouraged to first try ambulating without and then use RW if needed. Pt plans to stay at the [facility] initially in order to make it to appointments at hospital. Recommended home w home PT and RW.    Patient/Family Stated Goals  Patient/Family Stated Goal(s) take care of self    Frequency/Equipment Recommendations  PT Frequency 3x per week   Next Treatment Expected 10/12/22   PT/PTA completing this assessment [facility]    PT Recommendations for Inpatient Admission  Activity/Level of Assist ambulate;assist of 1;no device;in hall    Planned Treatment / Interventions  Plan for Next Visit Progress as tolerated    PT Discharge Summary  Physical Therapy Disposition Recommendation Home   Additional Physical Therapy Disposition Recommendations Home Physical Therapy;Home with family support Equipment Recommendations for Discharge Levan Hurst - The patient will use a Rolling Walker in the home and outside daily to provide greater stability and safer ambulation for participation in ADLs     Arlyss Gandy, DPT, PT

## 2022-10-09 NOTE — Plan of Care
Plan of Care Overview/ Patient Status    SOCIAL WORK NOTEPatient Name: Xavius Sangha BennettMedical Record Number: [id number] Date of Birth: 1952-06-04Medical Social Work Follow Up  [employer] Most Recent Value Admission Information  Reason for Current Social Work Involvement Support/Coping, Resources Source of Information Patient, Medical Team Medical Team Comment Mellody Dance, Care Manager Record Reviewed Yes Level of Care Ambulatory What medium(s) of communication were used with patient/family/caregiver? Telephone Psychosocial issues requiring intervention Hotel reservation support Psychosocial interventions 30 minutes were spent on the phone with Mr. Marchak from [facility].  I received a referral from Mellody Dance, Care Manager for support changing Mr. Friedlander  hotel reservation.  Originally, Mr. Rentsch was due for discharge on Monday 8/26, but now his discharge has been moved to Saturday 8/24.  I reached out to [place] and confirmed for Saturday and Sunday on the 2nd floor next to the elevator.  Mr. Goodner agreed to the change, and he will be back in the room originally reserved for Monday. He also inquired about [facility] Option,  Cala Bradford assured me she will take care of it.   Mr. Rohal is aware of social work availability should any needs/concerns arise. Collaborations Mellody Dance, Futures trader Specific referrals to enhance community supports (include existing and new resources) [employer] Required? No Next Steps/Plan (including hand-off): No further social work intervention indicated at this time.  Please reconsult if additional needs arise. Signature: Oren Bracket, LMSW Contact Information: [phone]

## 2022-10-09 NOTE — Plan of Care
Douglas [name]					Location: NP 11/11238-A72 y.o., male				Attending: Kalman Drape, MD	Admit Date: 09/18/2022			[id number] LOS: 21 days FOLLOW UP NUTRITION ASSESSMENTDIET ORDER: Regular dietNutrition Supplements: Ensure HP PRN VanillaANTHROPOMETRICS:Height: 71Admit wt: 102.9 kg 	   Current wt: 101.5 kg   BMI: 31.21Dosing weight: 86kg ([place] +10%)Additional wt information:Pt confirmed ht of 5'11 (71). Pt with significnt wt loss of 11 kg (9.7%) within the last 3 months. Pt with 1+ B/L LE edema per flowsheets, will monitor wt trends.Lowest wt for each month available in EMR over the last year:09/25/22	102.3 kg07/31/24	104.2 kg06/26/24	111 kg05/22/24	112.5 kg04/12/24	112.8 kgNFPE:No overt depletion obsevedSKIN: No skin related changes that require adjustment to nutrition estimated needs.  ESTIMATED NUTRITION REQUIREMENTS:Kcal/day: 2580		(30kcal/kg)Protein/day: 103- 129	(1.2- 1.5gm/kg)Fluids/day: Fluid management per medical team discretion. 2580 mL (67mL/kcal) Needs based on: Dosing wt (86kg), AML, Allo SCT (day 0= 8/8) NUTRITION ASSESSMENT: Chart reviewed for follow up assessment. Labs and meds reviewed. Pt occupied with PT upon attempt to visit this AM, interview deferred. Review of meal ordering software indicates pt ordered 2 meals per day over the last 7 days. No oral nutrition supplements were provided from kitchen. Averaged 1205 kcal and 44g protein provided in house daily over the last week. Pt noted to be completing 0- 50% of meals when intake is documented. Pt is likely falling short of nutrition targets. Will continue to follow.Meds:Zofran d/c'd, PRN Lomotil and imodoim onboard,  Mg repletion on board Labs: Mg 1.6 on 8/22GI: LBM 8/22Cultural/Religious/Ethnic Needs: NoFood Allergies:NKFANUTRITION DIAGNOSIS:Severe malnutrition related to acute illness as evidenced by energy intake < 50% for > 5 days and wt loss > 7.5%/ 3 months - continuesINTERVENTIONS/RECOMMENDATIONS: Meals and Snacks:- Regular diet with bottled water and PC juices only, Coffee/ tea OK- Small, frequent meals- Allow pediatric menuGoal:   Patient to consume an average of > 50-75% meals prior to next nutrition assessment - not met, continueNutrition Supplement Therapy:- Continue Ensure +  PRNGoal:  Patient to consume an average of 1- 2 oral nutrition supplements/day prior to next nutrition assessment- not met consistently, continue Nutrition Education (8/9): - A Guide to Good Nutrition	Provided education material, pt prefers to review on his own and will reach out with questions if needed.Goal:  Patient able to identify 2 principals of food safety prior to next nutrition assessment- not met, continueDischarge Planning and Transfer of Nutrition Care:  Nutrition related discharge needs still being determined at this time, will continue to follow  MONITORING / EVALUATION:Food/Nutrition-Related History:Food and Nutrient IntakeEnteral and Parenteral Nutrition IntakeAnthropometric Measurements:Weight Biochemical Data, Medical Tests and Procedures:Electrolyte and renal profileGlucose/endocrine profileNutrition-Focus Physical FindingsOverall findingsElectronically signed by Cyril Loosen, RD, August 23, 2024[facility] Dynamic Role - Nutrition Service - [facility] Adult Service Please note: Nutrition is a consult only service on weekends and holidays.  Please enter a consult in EPIC if assistance is needed on a weekend or holiday or via [facility] Dynamic Role as ?Food & Nutrition Adult Weekend Dietitian [facility]? to contact the covering RD.?

## 2022-10-09 NOTE — Progress Notes
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 Walked with PT this morning. Appetite slightly improving, diarrhea improving as well, no new rash or bleeding noted.Review of Systems Constitutional:  Positive for malaise/fatigue. Negative for chills and fever. HENT: Negative.  Negative for sore throat.  Eyes: Negative.  Respiratory: Negative.  Negative for cough, sputum production, shortness of breath and wheezing.  Cardiovascular: Negative.  Negative for chest pain, palpitations and leg swelling. Gastrointestinal:  Positive for diarrhea (improving). Negative for abdominal pain, constipation, nausea and vomiting. Genitourinary:  Negative for dysuria, frequency and urgency. Musculoskeletal: Negative.  Skin:  Positive for rash. Negative for itching.      Toes discolored - not new Neurological: Negative.  Psychiatric/Behavioral: Negative.   Review of Allergies/Meds/Hx: Allergies Allergen Reactions  Voriconazole Other (See Comments)   Gingival edema/redness making it difficult to bite down  Medications:Current Facility-Administered Medications:   acetaminophen (TYLENOL) tablet 650 mg, 650 mg, Oral, Q6H PRN, Flonnie Hailstone, Adyn Hoes, PA  acyclovir (ZOVIRAX) tablet 400 mg, 400 mg, Oral, BID, Jalin Alicea, PA, 400 mg at 10/09/22 0927  albuterol neb sol 2.5 mg/3 mL (0.083%) (PROVENTIL,VENTOLIN), 2.5 mg, Nebulization, Q30 MIN PRN, Chrystine Oiler, MD  amLODIPine (NORVASC) tablet 10 mg, 10 mg, Oral, Daily, Rall, Kerri A, PA, 10 mg at 10/09/22 2952  chlorhexidine gluconate (PERIDEX) 0.12 % solution 15 mL, 15 mL, Mouth/Throat, BID, Al-Musa, Amer, MD, 15 mL at 10/09/22 0924  clindamycin (CLEOCIN T) 1 % external solution, , Topical (Top), BID, Rall, Kerri A, [place], Given at 10/08/22 2107  diphenhydrAMINE (BENADRYL) injection 50 mg, 50 mg, IV Push, Once PRN, Chrystine Oiler, MD  diphenoxylate-atropine (LOMOTIL) 2.5-0.025 mg per tablet 1 tablet, 1 tablet, Oral, 4x DAILY PRN, Roetta Sessions, MD, 1 tablet at 10/06/22 1202  EPINEPHrine (EPI-PEN) auto-injector 0.3 mg, 0.3 mg, Intramuscular, Q15 MIN PRN, Chrystine Oiler, MD  EPINEPHrine (EPI-PEN) auto-injector 0.3 mg, 0.3 mg, Intramuscular, Q15 MIN PRN, Chrystine Oiler, MD  erythromycin (ROMYCIN) 5 mg/gram (0.5 %) ophthalmic ointment, , Left Eye, Nightly, Al-Musa, Denyse Dago, MD, Given at 10/08/22 2107  famotidine (PF) (PEPCID) 20 mg in sodium chloride 0.9% PF 5 mL Injection, 20 mg, IV Push, Once PRN, Chrystine Oiler, MD  finasteride (PROSCAR) tablet 5 mg, 5 mg, Oral, Daily, Al-Musa, Amer, MD, 5 mg at 10/09/22 8413  hydrocortisone sodium succinate (PF) (Solu-CORTEF) injection 100 mg, 100 mg, IV Push, Once PRN, Chrystine Oiler, MD  isavuconazonium (Cresemba) capsule Cap 372 mg, 372 mg, Oral, Daily, Chrystine Oiler, MD, 372 mg at 10/09/22 0926  latanoprost (XALATAN) 0.005 % ophthalmic solution 1 drop, 1 drop, Both Eyes, Nightly, Al-Musa, Amer, MD, 1 drop at 10/08/22 2107  loperamide (IMODIUM) capsule 2 mg, 2 mg, Oral, 4x DAILY PRN, Roetta Sessions, MD, 2 mg at 10/08/22 1703  LORazepam (ATIVAN) tablet 0.5 mg, 0.5 mg, Oral, Q6H PRN, Chrystine Oiler, MD  magnesium oxide (MAG-OX) tablet 400 mg, 400 mg, Oral, BID, Aylana Hirschfeld, PA, 400 mg at 10/09/22 0927  magnesium sulfate in water 2 gram/50 mL (4 %) (IVPB) 2 g,  2 g, Intravenous, Q1H PRN, Last Rate: 50 mL/hr at 10/08/22 1706, 2 g at 10/08/22 1706 **OR** magnesium sulfate in water 4 gram/100 mL (4 %) sterile water IVPB 4 g, 4 g, Intravenous, Q2H PRN **OR** magnesium sulfate in water 2 gram/50 mL (4 %) (IVPB) 2 g, 2 g, Intravenous, Q1H PRN, Pugliese, Amy Barile, APRN, Last Rate: 50 mL/hr at 10/06/22 1033, 2 g at 10/06/22 1033  normal saline 0.9 % oral rinse 30 mL, 30 mL, Oral, 4x DAILY PRN, William Hamburger, MD  ondansetron (PF) [facility]) injection 8 mg, 8 mg, IV Push, Q12H PRN, Flonnie Hailstone, Kenyetta Fife, PA, 8 mg at 10/09/22 0936  potassium chloride in sterile water 20 mEq/100 mL IVPB 20 mEq, 20 mEq, Intravenous, Q1H PRN, Magdalene River, MD, Last Rate: 100 mL/hr at 10/07/22 0958, 20 mEq at 10/07/22 0958  potassium chloride in sterile water 20 mEq/50 mL IVPB 20 mEq, 20 mEq, Intravenous, Q1H PRN, Macon Large, Amin, MD  prochlorperazine edisylate (COMPAZINE) injection 10 mg, 10 mg, IV Push, Q6H PRN, Chrystine Oiler, MD, 10 mg at 10/05/22 2053  sodium chloride 0.9 % (new bag) bolus 500 mL, 500 mL, Intravenous, Once PRN, Chrystine Oiler, MD  sodium chloride 0.9 % flush 10 mL, 10 mL, IV Push, PRN for [facility], Chrystine Oiler, MD, 20 mL at 09/30/22 0939  sodium chloride 0.9 % flush 3 mL, 3 mL, IV Push, Q8H, Al-Musa, Amer, MD, 3 mL at 10/09/22 2130  sodium chloride 0.9 % flush 3 mL, 3 mL, IV Push, PRN for [facility], Roetta Sessions, MD  sodium chloride 0.9% infusion, 5 mL/hr, Intravenous, PRN, Chrystine Oiler, MD  tacrolimus (PROGRAF) Immediate Release capsule 6.5 mg, 6.5 mg, Oral, Q12H (0600-1800), Nassar, Amin, MD, 6.5 mg at 10/09/22 8657  ursodioL (URSO 250) tablet 500 mg, 500 mg, Oral, BID, Chrystine Oiler, MD, 500 mg at 10/09/22 0926Objective: Temp:  [97 ?F (36.1 ?C)-98.5 ?F (36.9 ?C)] 97.5 ?F (36.4 ?C)Pulse:  [70-83] 71Resp:  [18] 18BP: (127-144)/(67-79) 137/75SpO2:  [95 %-98 %] 98 %I/O's:Gross Totals (Last 24 hours) at 10/09/2022 1039Last data filed at 10/09/2022 0919Intake 237 ml Output 600 ml Net -363 ml Wt: 10/09/22 101.5 kg 09/16/22 104.2 kg 09/13/22 104.3 kg 09/11/22 105.9 kg 09/09/22 105.4 kg 09/04/22 106.4 kg Physical ExamConstitutional:     General: He is not in acute distress.HENT:    Nose: Nose normal.    Mouth/Throat:    Mouth: Mucous membranes are moist.    Pharynx: Oropharynx is clear. No oropharyngeal exudate. Eyes:    Conjunctiva/sclera: Conjunctivae normal. Cardiovascular:    Rate and Rhythm: Normal rate and regular rhythm.    Heart sounds: Normal heart sounds. No murmur heard.Pulmonary:    Effort: Pulmonary effort is normal.    Breath sounds: Normal breath sounds. No wheezing. Abdominal:    General: Abdomen is flat. Bowel sounds are normal.    Palpations: Abdomen is soft.    Tenderness: There is no abdominal tenderness. Musculoskeletal:    Right lower leg: No edema.    Left lower leg: No edema. Skin:   General: Skin is warm.    Findings: Rash: Mild erythema / edema of left eye lid; overall significantly improved; mild erythema over forehead.    Comments: Toes hyperpigmetned/?PVD - not newBruises, petechiae scattered Neurological:    Mental Status: He is alert and oriented to person, place, and time. Mental status is at baseline. Psychiatric:       Mood and Affect: Mood normal. Labs:Recent Results (from the past 24 hour(s)) Tacrolimus level  Collection Time:  10/09/22  6:05 AM Result Value Ref Range  Tacrolimus Lvl 8.5 See Comment ng/mL CBC auto differential  Collection Time: 10/09/22  6:05 AM Result Value Ref Range  WBC 5.3 4.0 - 11.0 x1000/?L  RBC 2.82 (L) 4.00 - 6.00 M/?L  Hemoglobin 7.9 (L) 13.2 - 17.1 g/dL  Hematocrit 38.75 (L) 64.33 - 50.00 %  MCV 83.7 80.0 - 100.0 fL  MCH 28.0 27.0 - 33.0 pg  MCHC 33.5 31.0 - 36.0 g/dL  RDW-CV 29.5 (H) 18.8 - 15.0 %  Platelets 33 (L) 150 - 420 x1000/?L  MPV    Neutrophils 52.4 39.0 - 72.0 %  Lymphocytes 2.3 (L) 17.0 - 50.0 %  Monocytes 36.2 (H) 4.0 - 12.0 %  Eosinophils 0.0 0.0 - 5.0 %  Basophil 0.4 0.0 - 1.4 %  Immature Granulocytes 8.7 (H) 0.0 - 1.0 %  nRBC 2.3 (H) 0.0 - 1.0 %  Absolute Lymphocyte Count 0.12 (L) 0.60 - 3.70 x 1000/?L  Monocyte Absolute Count 1.92 (H) 0.00 - 1.00 x 1000/?L  Eosinophil Absolute Count 0.00 0.00 - 1.00 x 1000/?L  Basophil Absolute Count 0.02 0.00 - 1.00 x 1000/?L  Absolute Immature Granulocyte Count 0.46 (H) 0.00 - 0.30 x 1000/?L  Absolute nRBC 0.12 0.00 - 1.00 x 1000/?L  ANC (Abs Neutrophil Count) 2.78 2.00 - 7.60 x 1000/?L Comprehensive metabolic panel  Collection Time: 10/09/22  6:05 AM Result Value Ref Range  Sodium 141 136 - 144 mmol/L  Potassium 3.8 3.3 - 5.3 mmol/L  Chloride 109 (H) 98 - 107 mmol/L  CO2 21 20 - 30 mmol/L  Anion Gap 11 7 - 17  Glucose 98 70 - 100 mg/dL  BUN 11 8 - 23 mg/dL  Creatinine 4.16 6.06 - 1.30 mg/dL  Calcium 8.7 (L) 8.8 - 10.2 mg/dL  BUN/Creatinine Ratio 30.1 8.0 - 23.0  Total Protein 6.2 5.9 - 8.3 g/dL  Albumin 3.2 (L) 3.6 - 5.1 g/dL  Total Bilirubin 0.4 <=6.0 mg/dL  Alkaline Phosphatase 109 9 - 122 U/L  Alanine Aminotransferase (ALT) 21 9 - 59 U/L  Aspartate Aminotransferase (AST) 29 10 - 35 U/L  Globulin 3.0 2.0 - 3.9 g/dL  A/G Ratio 1.1 1.0 - 2.2  AST/ALT Ratio 1.4 Reference Range Not Established  eGFR (Creatinine) >60 >=60 mL/min/1.71m2 Immature Platelet Fraction (BH GH LMW YH)  Collection Time: 10/09/22  6:05 AM Result Value Ref Range  Immature Platelet Fraction 13.1 (H) 1.2 - 8.6 %  Absolute Immature Platelet Fraction 4.3 <20.0 x1000/?L Diagnostics:No results found.Assessment: Douglas [name] is a 71 y.o. 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 He is s/p 2 cycles of chemotherapy with Decitabine + Venetoclax with reduction in blast count. 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  Plan:  Day +15 (10/09/19): fatigued, remains afebrile, no transfusion required today.Continue supportive care. Plan for discharge to suits tomorrow, meds will be delivered to the room at the day of discharge.Conditioning/Transplant:-Melphalan 70 mg/m2 over 30 minutes on D-6, *Melphalan dosed reduced to 70 mg/m2 due to age-Fludarabine 40 mg/m2 over 30 minutes every 24hr for 4 doses from D-5 to D -2             -PT cyclophosphamide 25 mg/kg IV over 1.5 hrs every 24hr for 2 doses on D +3 & D +4 (reduced by 50% to 25 mg/kg due 10/10 match and age)-Mesna 50 mg/kg  CIVI daily, for 2 doses, starting 15 minutes before cyclophosphamide on D +3 & D +4-Stem cell infusion day 0 (09/24/22) HEME:  - Transfuse pRBCs to maintain Hb >7 g/dL, platelets >56 or if clinically indicated- Filgrastim 52mcg/kg started on day +5 and discontinued on day+13 with stable engraftment of neutrophils.ID: Prophylactic antimicrobials: Acyclovir 400 mg PO TID (home medications),  Zosyn dc'd 8/21 with engraftment, Isavuconazole 372 mg po daily (prior authorization pending, voriconazole allergy-gingival hyperplasia), Pentamidine 300 mg inhalation monthly- CMV -/-, Check CMV PCR weekly.- If febrile, panculture, UA and CXR, start broad spectrum empiric antibiotics if neutropenic or septic appearing.   - Repeat blood cx every 24 hours with temperature > 100.4 or septic appearing GVHD Prophylaxis:  - currently on Tacrolimus 6.5mg  PO BID. Level 7.2 today, adjust dose for goal of 6-11. - PT cyclophosphamide 25 mg/kg IV over 1.5 hrs every 24hr for 2 doses on D +3 & D +4 with mesna daily on Day +3 and Day +4  (PTCy dose reduced by 50% )- Monitor levels, haptoglobin, LDH, triglycerides.- Daily skin exam, liver function tests twice weekly (minimum), document stool output/day. Fluids, Electrolytes and Nutrition:encouraged nutrition supplement- Regular diet, consider nutrition consult if needed- Daily weights- Strict Intake and output- Replete electrolytes PRNEmesis prophylaxis: Ondansetron 16 mg PO every 12 hours w/ PRN Compazine, Dexamethasone 12mg  po premed prior to melphalan High dose cyclophosphamide: doxepin premed 25 mg and diphenhydramine 25 mg 30 mins before Cytoxan; D5W 1/2NS + KCL 20 meq + Magnesium 1 gm alternating with D5W + of NaCHO3 + KCL 20 meq until 24 hours after last Cyclophosphamide dose (hold fluid during Cytoxan infusion)Diarrhea: imodium 2mg  PRN 4 times daily, lomotil 2.5-0.025 4x daily PRN  VOD: - Continue Ursodiol 500mg  PO BID, continue until Day +90- Daily weights, strict fluid monitoring of all intake/output- Monitor liver function tests and coags at least twice weekly HTN: -Patient reports not taking his antiHTN meds for the last few days due to feeling weak . Restarted during hospital course in setting of elevated BP-Increased Norvasc to 10mg  8/17 in the setting of SBP 150s-160s. Continue to monitor. 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 improving2) Forehead erythema and left eyelid erythema and swelling with purpura on eyelid - improving so will hold off on bx per derm, felt to be hordeolum, however, given pt counts will continue empiric tx for cellulitis, improving- warm compresses PRN for left eye + started erythromycin ointment for possible rosacea  CVAD: [facility] for chemotherapy and supportive care Discharge Planning: PT recommended home PT 24 hour caregiver: wifeSigned:Ceri Mayer Flonnie Hailstone, [phone]

## 2022-10-09 NOTE — Progress Notes
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 HENT: Negative.  Negative for sore throat.  Eyes: Negative.  Respiratory: Negative.  Negative for cough, sputum production, shortness of breath and wheezing.  Cardiovascular: Negative.  Negative for chest pain, palpitations and leg swelling. Gastrointestinal:  Negative for abdominal pain, constipation, diarrhea, nausea and vomiting. Genitourinary:  Negative for dysuria, frequency and urgency. Musculoskeletal: Negative.  Skin:  Negative for itching and rash. Neurological: Negative.  Psychiatric/Behavioral: Negative.   Review of Allergies/Meds/Hx: Allergies Allergen Reactions  Voriconazole Other (See Comments)   Gingival edema/redness making it difficult to bite down  Medications:Current Facility-Administered Medications:   acetaminophen (TYLENOL) tablet 650 mg, 650 mg, Oral, Q6H PRN, Flonnie Hailstone, Meifeng, PA  acyclovir (ZOVIRAX) tablet 400 mg, 400 mg, Oral, BID, Shen, Meifeng, PA, 400 mg at 10/09/22 0927  albuterol neb sol 2.5 mg/3 mL (0.083%) (PROVENTIL,VENTOLIN), 2.5 mg, Nebulization, Q30 MIN PRN, Chrystine Oiler, MD  amLODIPine (NORVASC) tablet 10 mg, 10 mg, Oral, Daily, Rall, Kerri A, PA, 10 mg at 10/09/22 1308  chlorhexidine gluconate (PERIDEX) 0.12 % solution 15 mL, 15 mL, Mouth/Throat, BID, Al-Musa, Amer, MD, 15 mL at 10/09/22 6578  clindamycin (CLEOCIN T) 1 % external solution, , Topical (Top), BID, Rall, Kerri A, [place], Given at 10/08/22 2107  diphenhydrAMINE (BENADRYL) injection 50 mg, 50 mg, IV Push, Once PRN, Chrystine Oiler, MD  diphenoxylate-atropine (LOMOTIL) 2.5-0.025 mg per tablet 1 tablet, 1 tablet, Oral, 4x DAILY PRN, Roetta Sessions, MD, 1 tablet at 10/06/22 1202  EPINEPHrine (EPI-PEN) auto-injector 0.3 mg, 0.3 mg, Intramuscular, Q15 MIN PRN, Chrystine Oiler, MD  EPINEPHrine (EPI-PEN) auto-injector 0.3 mg, 0.3 mg, Intramuscular, Q15 MIN PRN, Chrystine Oiler, MD  erythromycin (ROMYCIN) 5 mg/gram (0.5 %) ophthalmic ointment, , Left Eye, Nightly, Roetta Sessions, MD, Given at 10/08/22 2107  famotidine (PF) (PEPCID) 20 mg in sodium chloride 0.9% PF 5 mL Injection, 20 mg, IV Push, Once PRN, Chrystine Oiler, MD  finasteride (PROSCAR) tablet 5 mg, 5 mg, Oral, Daily, Roetta Sessions, MD, 5 mg at 10/09/22 4696  hydrocortisone sodium succinate (PF) (Solu-CORTEF) injection 100 mg, 100 mg, IV Push, Once PRN, Chrystine Oiler, MD  isavuconazonium (Cresemba) capsule Cap 372 mg, 372 mg, Oral, Daily, Chrystine Oiler, MD, 372 mg at 10/09/22 0926  latanoprost (XALATAN) 0.005 % ophthalmic solution 1 drop, 1 drop, Both Eyes, Nightly, Al-Musa, Amer, MD, 1 drop at 10/08/22 2107  loperamide (IMODIUM) capsule 2 mg, 2 mg, Oral, 4x DAILY PRN, Roetta Sessions, MD, 2 mg at 10/09/22 1820  LORazepam (ATIVAN) tablet 0.5 mg, 0.5 mg, Oral, Q6H PRN, Chrystine Oiler, MD  magnesium oxide (MAG-OX) tablet 400 mg, 400 mg, Oral, BID, Shen, Meifeng, PA, 400 mg at 10/09/22 2952  magnesium sulfate in water 2 gram/50 mL (4 %) (IVPB) 2 g, 2 g, Intravenous, Q1H PRN, Last Rate: 50 mL/hr at 10/08/22 1706, 2 g at 10/08/22 1706 **OR** magnesium sulfate in water 4 gram/100 mL (4 %) sterile water IVPB 4 g, 4 g, Intravenous, Q2H PRN **OR** magnesium sulfate in water 2 gram/50 mL (4 %) (IVPB) 2 g, 2 g, Intravenous, Q1H PRN, Kathaleen Grinder,  Tasia Catchings, APRN, Last Rate: 50 mL/hr at 10/06/22 1033, 2 g at 10/06/22 1033  normal saline 0.9 % oral rinse 30 mL, 30 mL, Oral, 4x DAILY PRN, William Hamburger, MD  ondansetron (PF) [facility]) injection 8 mg, 8 mg, IV Push, Q12H PRN, Flonnie Hailstone, Meifeng, PA, 8 mg at 10/09/22 0936  potassium chloride in sterile water 20 mEq/100 mL IVPB 20 mEq, 20 mEq, Intravenous, Q1H PRN, Magdalene River, MD, Last Rate: 100 mL/hr at 10/07/22 0958, 20 mEq at 10/07/22 0958  potassium chloride in sterile water 20 mEq/50 mL IVPB 20 mEq, 20 mEq, Intravenous, Q1H PRN, Magdalene River, MD  prochlorperazine edisylate (COMPAZINE) injection 10 mg, 10 mg, IV Push, Q6H PRN, Chrystine Oiler, MD, 10 mg at 10/05/22 2053  sodium chloride 0.9 % (new bag) bolus 500 mL, 500 mL, Intravenous, Once PRN, Chrystine Oiler, MD  sodium chloride 0.9 % flush 10 mL, 10 mL, IV Push, PRN for [facility], Chrystine Oiler, MD, 20 mL at 09/30/22 0939  sodium chloride 0.9 % flush 3 mL, 3 mL, IV Push, Q8H, Al-Musa, Amer, MD, 3 mL at 10/09/22 1610  sodium chloride 0.9 % flush 3 mL, 3 mL, IV Push, PRN for [facility], Roetta Sessions, MD  sodium chloride 0.9% infusion, 5 mL/hr, Intravenous, PRN, Chrystine Oiler, MD  tacrolimus (PROGRAF) Immediate Release capsule 6.5 mg, 6.5 mg, Oral, Q12H (0600-1800), Nassar, Amin, MD, 6.5 mg at 10/09/22 1715  ursodioL (URSO 250) tablet 500 mg, 500 mg, Oral, BID, Chrystine Oiler, MD, 500 mg at 10/09/22 0926Objective: Temp:  [97.5 ?F (36.4 ?C)-98.5 ?F (36.9 ?C)] 97.7 ?F (36.5 ?C)Pulse:  [70-83] 80Resp:  [18] 18BP: (124-137)/(67-75) 132/69SpO2:  [95 %-99 %] 98 %I/O's:Gross Totals (Last 24 hours) at 10/09/2022 1826Last data filed at 10/09/2022 1519Intake 237 ml Output 700 ml Net -463 ml Wt: 10/09/22 101.5 kg 09/16/22 104.2 kg 09/13/22 104.3 kg 09/11/22 105.9 kg 09/09/22 105.4 kg 09/04/22 106.4 kg Physical ExamConstitutional:     Appearance: Normal appearance. He is normal weight. He is not ill-appearing or diaphoretic. HENT: Head: Normocephalic.    Nose: Nose normal.    Mouth/Throat:    Mouth: Mucous membranes are moist.    Pharynx: Oropharynx is clear. No oropharyngeal exudate. Eyes:    General: No scleral icterus.      Right eye: No discharge.       Left eye: No discharge. Cardiovascular:    Rate and Rhythm: Normal rate and regular rhythm.    Heart sounds: Normal heart sounds. No murmur heard.Pulmonary:    Effort: Pulmonary effort is normal. No respiratory distress.    Breath sounds: Normal breath sounds. No wheezing or rales. Abdominal:    General: Abdomen is flat. Bowel sounds are normal. There is no distension.    Palpations: Abdomen is soft.    Tenderness: There is no abdominal tenderness. There is no guarding. Musculoskeletal:       General: No swelling or deformity. Normal range of motion.    Right lower leg: No edema.    Left lower leg: No edema. Skin:   General: Skin is warm.    Findings: Rash (Mild erythema / edema of left eye lid; overall significantly improved; mild erythema over forehead) present.    Comments: Toes hyperpigmetned/?PVD - not newBruises, petechiae scattered Neurological:    Mental Status: He is alert and oriented to person, place, and time. Psychiatric:       Mood and Affect: Mood normal. Labs:Recent Results (from the past 24 hour(s)) Tacrolimus level  Collection Time: 10/09/22  6:05  AM Result Value Ref Range  Tacrolimus Lvl 8.5 See Comment ng/mL CBC auto differential  Collection Time: 10/09/22  6:05 AM Result Value Ref Range  WBC 5.3 4.0 - 11.0 x1000/?L  RBC 2.82 (L) 4.00 - 6.00 M/?L  Hemoglobin 7.9 (L) 13.2 - 17.1 g/dL  Hematocrit 10.27 (L) 25.36 - 50.00 %  MCV 83.7 80.0 - 100.0 fL  MCH 28.0 27.0 - 33.0 pg  MCHC 33.5 31.0 - 36.0 g/dL  RDW-CV 64.4 (H) 03.4 - 15.0 %  Platelets 33 (L) 150 - 420 x1000/?L  MPV    Neutrophils 52.4 39.0 - 72.0 %  Lymphocytes 2.3 (L) 17.0 - 50.0 %  Monocytes 36.2 (H) 4.0 - 12.0 %  Eosinophils 0.0 0.0 - 5.0 %  Basophil 0.4 0.0 - 1.4 %  Immature Granulocytes 8.7 (H) 0.0 - 1.0 %  nRBC 2.3 (H) 0.0 - 1.0 %  Absolute Lymphocyte Count 0.12 (L) 0.60 - 3.70 x 1000/?L  Monocyte Absolute Count 1.92 (H) 0.00 - 1.00 x 1000/?L  Eosinophil Absolute Count 0.00 0.00 - 1.00 x 1000/?L  Basophil Absolute Count 0.02 0.00 - 1.00 x 1000/?L  Absolute Immature Granulocyte Count 0.46 (H) 0.00 - 0.30 x 1000/?L  Absolute nRBC 0.12 0.00 - 1.00 x 1000/?L  ANC (Abs Neutrophil Count) 2.78 2.00 - 7.60 x 1000/?L Comprehensive metabolic panel  Collection Time: 10/09/22  6:05 AM Result Value Ref Range  Sodium 141 136 - 144 mmol/L  Potassium 3.8 3.3 - 5.3 mmol/L  Chloride 109 (H) 98 - 107 mmol/L  CO2 21 20 - 30 mmol/L  Anion Gap 11 7 - 17  Glucose 98 70 - 100 mg/dL  BUN 11 8 - 23 mg/dL  Creatinine 7.42 5.95 - 1.30 mg/dL  Calcium 8.7 (L) 8.8 - 10.2 mg/dL  BUN/Creatinine Ratio 63.8 8.0 - 23.0  Total Protein 6.2 5.9 - 8.3 g/dL  Albumin 3.2 (L) 3.6 - 5.1 g/dL  Total Bilirubin 0.4 <=7.5 mg/dL  Alkaline Phosphatase 643 9 - 122 U/L  Alanine Aminotransferase (ALT) 21 9 - 59 U/L  Aspartate Aminotransferase (AST) 29 10 - 35 U/L  Globulin 3.0 2.0 - 3.9 g/dL  A/G Ratio 1.1 1.0 - 2.2  AST/ALT Ratio 1.4 Reference Range Not Established  eGFR (Creatinine) >60 >=60 mL/min/1.60m2 Immature Platelet Fraction (BH GH LMW YH)  Collection Time: 10/09/22  6:05 AM Result Value Ref Range  Immature Platelet Fraction 13.1 (H) 1.2 - 8.6 %  Absolute Immature Platelet Fraction 4.3 <20.0 x1000/?L Lactate dehydrogenase  Collection Time: 10/09/22  5:24 PM Result Value Ref Range  LD 618 (H) 122 - 241 U/L PT/INR and PTT (BH GH L LMW YH)  Collection Time: 10/09/22  5:24 PM Result Value Ref Range  Prothrombin Time 13.7 (H) 9.6 - 12.3 seconds  INR 1.25 (H) 0.86 - 1.12  PTT 27.9 23.0 - 31.4 seconds Haptoglobin  Collection Time: 10/09/22  5:24 PM Result Value Ref Range  Haptoglobin 283 (H) 30 - 200 mg/dL Diagnostics:No results found.Assessment: Douglas [name] is a 72 y.o. 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 He is s/p 2 cycles of chemotherapy with Decitabine + Venetoclax with reduction in blast count. 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  Plan:  Day +15EngraftedMobilizing wellPrepare for discharge over the weekendConditioning/Transplant: (completed)-Melphalan 70 mg/m2 over 30 minutes on D-6, *Melphalan dosed reduced to 70 mg/m2 due to age-Fludarabine  40 mg/m2 over 30 minutes every 24hr for 4 doses from D-5 to D -2             -PT cyclophosphamide 25 mg/kg IV over 1.5 hrs every 24hr for 2 doses on D +3 & D +4 (reduced by 50% to 25 mg/kg due 10/10 match and age)-[place]esna 50 mg/kg CIVI daily, for 2 doses, starting 15 minutes before cyclophosphamide on D +3 & D +4-Stem cell infusion day 0 (09/24/22) HEME: - Transfuse pRBCs to maintain Hb >7 g/dL, platelets >16 or if clinically indicated- Filgrastim 35mcg/kg starting on day +5 until stable engraftment of neutrophils.ID: Prophylactic antimicrobials: Acyclovir 400 mg PO TID (home medications),  Zosyn, Isavuconazole 372 mg po daily (prior authorization pending, voriconazole allergy-gingival hyperplasia), Pentamidine 300 mg inhalation monthly- CMV -/pending, attempt to get letermovir for CMV prophylaxis. Will send script on Day +7 to determine insurance coverage before starting inpatient.Check CMV PCR weekly.- If febrile, panculture, UA and CXR, start broad spectrum empiric antibiotics if neutropenic or septic appearing.   - Repeat blood cx every 24 hours with temperature > 100.4 or septic appearing GVHD Prophylaxis:  - Tacrolimus Goal of 6-11ng/ml- PT cyclophosphamide 25 mg/kg IV over 1.5 hrs every 24hr for 2 doses on D +3 & D +4 with mesna daily on Day +3 and Day +4  (PTCy dose reduced by 50% )- Monitor levels, haptoglobin, LDH, triglycerides.- Daily skin exam, liver function tests twice weekly (minimum), document stool output/day. Fluids, Electrolytes and Nutrition:- Regular diet, consider nutrition consult if needed- Daily weights- Strict Intake and output- Replete electrolytes PRNEmesis prophylaxis: Ondansetron 16 mg PO every 12 hours w/ PRN Compazine, Dexamethasone 12mg  po premed prior to melphalan High dose cyclophosphamide: doxepin premed 25 mg and diphenhydramine 25 mg 30 mins before Cytoxan; D5W 1/2NS + KCL 20 meq + Magnesium 1 gm alternating with D5W + of NaCHO3 + KCL 20 meq until 24 hours after last Cyclophosphamide dose (hold fluid during Cytoxan infusion)Diarrhea: imodium 2mg  PRN 4 times daily, lomotil 2.5-0.025 4x daily PRN  VOD: - Continue Ursodiol 500mg  PO BID, continue until Day +90- Daily weights, strict fluid monitoring of all intake/output- Monitor liver function tests and coags at least twice weekly HTN: Increased Norvasc to 10mg  in the setting of SBP 150s-160s. Continue to monitor Patient reports not taking his antiHTN meds for the last few days due to feeling weak. PTA. 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 2) Forehead erythema and left eyelid erythema and swelling with purpura on eyelid - improving so will hold off on bx per derm, felt to be hordeolum, however, given pt counts will continue empiric tx for cellulitis- warm compresses PRN for left eye + will start erythromycin ointment for possible rosacea  - Clinically resolvedCVAD: [facility] for chemotherapy and supportive care Discharge Planning: Pending count recovery 24 hour caregiver:

## 2022-10-09 NOTE — Plan of Care
Plan of Care Overview/ Patient Status    1500-1900Received patient A/Ox4, vss, afebRDL Hick dressing CDI, saline locked.On RA.Patient is independent, skin is intact.Requested PRN imodium for diarrhea.Safety/protective precautions maintained. Call bell and personal items w/in reach.POC observed.Problem: Adult Inpatient Plan of CareGoal: Plan of Care ReviewOutcome: Interventions implemented as appropriate

## 2022-10-09 NOTE — Plan of Care
Plan of Care Overview/ Patient Status    1900-0700: Patient was awake when received to care. No complaints made. Vital signs stable on room air. Quiet and healing environment provided. Needs attended. Safety and universal fall precautions maintained. Call light and personal needs within reach. Will continue to monitor. Refer to flow sheet.

## 2022-10-09 NOTE — Progress Notes
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 Negative for chills and fever. HENT: Negative.  Negative for sore throat.  Eyes: Negative.  Respiratory: Negative.  Negative for cough, sputum production, shortness of breath and wheezing.  Cardiovascular: Negative.  Negative for chest pain, palpitations and leg swelling. Gastrointestinal:  Positive for diarrhea and nausea. Negative for abdominal pain, constipation and vomiting. Genitourinary:  Negative for dysuria, frequency and urgency. Musculoskeletal: Negative.  Skin:  Positive for rash. Negative for itching.      Toes discolored - not new Neurological: Negative.  Psychiatric/Behavioral: Negative.   Review of Allergies/Meds/Hx: Allergies Allergen Reactions  Voriconazole Other (See Comments)   Gingival edema/redness making it difficult to bite down  Medications:Current Facility-Administered Medications:   acetaminophen (TYLENOL) tablet 650 mg, 650 mg, Oral, Q6H PRN, Flonnie Hailstone, [place], PA  acyclovir (ZOVIRAX) tablet 400 mg, 400 mg, Oral, BID, Shen, Meifeng, PA, 400 mg at 10/07/22 2149  albuterol neb sol 2.5 mg/3 mL (0.083%) (PROVENTIL,VENTOLIN), 2.5 mg, Nebulization, Q30 MIN PRN, Chrystine Oiler, MD  amLODIPine (NORVASC) tablet 10 mg, 10 mg, Oral, Daily, Rall, Kerri A, PA, 10 mg at 10/07/22 0848  chlorhexidine gluconate (PERIDEX) 0.12 % solution 15 mL, 15 mL, Mouth/Throat, BID, Al-Musa, Amer, MD, 15 mL at 10/07/22 2153  clindamycin (CLEOCIN T) 1 % external solution, , Topical (Top), BID, Rall, Kerri A, [place], Given at 10/07/22 2154  diphenhydrAMINE (BENADRYL) injection 50 mg, 50 mg, IV Push, Once PRN, Chrystine Oiler, MD  diphenoxylate-atropine (LOMOTIL) 2.5-0.025 mg per tablet 1 tablet, 1 tablet, Oral, 4x DAILY PRN, Roetta Sessions, MD, 1 tablet at 10/06/22 1202  EPINEPHrine (EPI-PEN) auto-injector 0.3 mg, 0.3 mg, Intramuscular, Q15 MIN PRN, Chrystine Oiler, MD  EPINEPHrine (EPI-PEN) auto-injector 0.3 mg, 0.3 mg, Intramuscular, Q15 MIN PRN, Chrystine Oiler, MD  erythromycin (ROMYCIN) 5 mg/gram (0.5 %) ophthalmic ointment, , Left Eye, Nightly, Roetta Sessions, MD, Given at 10/07/22 2152  famotidine (PF) (PEPCID) 20 mg in sodium chloride 0.9% PF 5 mL Injection, 20 mg, IV Push, Once PRN, Chrystine Oiler, MD  finasteride (PROSCAR) tablet 5 mg, 5 mg, Oral, Daily, Roetta Sessions, MD, 5 mg at 10/07/22 0848  hydrocortisone sodium succinate (PF) (Solu-CORTEF) injection 100 mg, 100 mg, IV Push, Once PRN, Chrystine Oiler, MD  isavuconazonium (Cresemba) capsule Cap 372 mg, 372 mg, Oral, Daily, Chrystine Oiler, MD, 372 mg at 10/07/22 0847  latanoprost (XALATAN) 0.005 % ophthalmic solution 1 drop, 1 drop, Both Eyes, Nightly, Al-Musa, Amer, MD, 1 drop at 10/07/22 2151  loperamide (IMODIUM) capsule 2 mg, 2 mg, Oral, 4x DAILY PRN, Roetta Sessions, MD, 2 mg at 10/07/22 1750  LORazepam (ATIVAN) tablet 0.5 mg, 0.5 mg, Oral, Q6H PRN, Chrystine Oiler, MD  magnesium oxide (MAG-OX) tablet 400 mg, 400 mg, Oral, BID, Shen, Meifeng, PA, 400 mg at 10/07/22 2149  magnesium sulfate in water 2 gram/50 mL (4 %) (IVPB) 2 g, 2 g, Intravenous, Q1H PRN **OR** magnesium sulfate in water 4 gram/100 mL (4 %) sterile water IVPB 4 g, 4 g, Intravenous, Q2H PRN **OR** magnesium sulfate in water 2 gram/50 mL (4 %) (IVPB) 2 g,  2 g, Intravenous, Q1H PRN, [name], APRN, Last Rate: 50 mL/hr at 10/06/22 1033, 2 g at 10/06/22 1033  normal saline 0.9 % oral rinse 30 mL, 30 mL, Oral, 4x DAILY PRN, William Hamburger, MD  ondansetron (PF) [facility]) injection 8 mg, 8 mg, IV Push, Q12H PRN, Flonnie Hailstone, Meifeng, PA, 8 mg at 10/07/22 1345  potassium chloride in sterile water 20 mEq/100 mL IVPB 20 mEq, 20 mEq, Intravenous, Q1H PRN, Magdalene River, MD, Last Rate: 100 mL/hr at 10/07/22 0958, 20 mEq at 10/07/22 0958  potassium chloride in sterile water 20 mEq/50 mL IVPB 20 mEq, 20 mEq, Intravenous, Q1H PRN, Magdalene River, MD  prochlorperazine edisylate (COMPAZINE) injection 10 mg, 10 mg, IV Push, Q6H PRN, Chrystine Oiler, MD, 10 mg at 10/05/22 2053  sodium chloride 0.9 % (new bag) bolus 500 mL, 500 mL, Intravenous, Once PRN, Chrystine Oiler, MD  sodium chloride 0.9 % flush 10 mL, 10 mL, IV Push, PRN for [facility], Chrystine Oiler, MD, 20 mL at 09/30/22 0939  sodium chloride 0.9 % flush 3 mL, 3 mL, IV Push, Q8H, Al-Musa, Amer, MD, 3 mL at 10/07/22 2158  sodium chloride 0.9 % flush 3 mL, 3 mL, IV Push, PRN for [facility], Roetta Sessions, MD  sodium chloride 0.9% infusion, 5 mL/hr, Intravenous, PRN, Chrystine Oiler, MD  tacrolimus (PROGRAF) Immediate Release capsule 6.5 mg, 6.5 mg, Oral, Q12H (0600-1800), Nassar, Amin, MD, 6.5 mg at 10/07/22 1750  ursodioL (URSO 250) tablet 500 mg, 500 mg, Oral, BID, Chrystine Oiler, MD, 500 mg at 10/07/22 2149Objective: Temp:  [97.2 ?F (36.2 ?C)-98.8 ?F (37.1 ?C)] 97.6 ?F (36.4 ?C)Pulse:  [76-96] 77Resp:  [18] 18BP: (117-137)/(68-79) 125/68SpO2:  [95 %-100 %] 95 %I/O's:Gross Totals (Last 24 hours) at 10/08/2022 0559Last data filed at 10/07/2022 2300Intake 1497 ml Output 100 ml Net 1397 ml Wt: 10/08/22 102 kg 09/16/22 104.2 kg 09/13/22 104.3 kg 09/11/22 105.9 kg 09/09/22 105.4 kg 09/04/22 106.4 kg Physical ExamConstitutional:     Appearance: Normal appearance. He is normal weight. He is not ill-appearing or diaphoretic. HENT:    Head: Normocephalic.    Nose: Nose normal.    Mouth/Throat:    Mouth: Mucous membranes are moist.    Pharynx: Oropharynx is clear. No oropharyngeal exudate. Eyes:    General: No scleral icterus.      Right eye: No discharge.       Left eye: No discharge. Cardiovascular:    Rate and Rhythm: Normal rate and regular rhythm.    Heart sounds: Normal heart sounds. No murmur heard.Pulmonary:    Effort: Pulmonary effort is normal. No respiratory distress.    Breath sounds: Normal breath sounds. No wheezing or rales. Abdominal:    General: Abdomen is flat. Bowel sounds are normal. There is no distension.    Palpations: Abdomen is soft.    Tenderness: There is no abdominal tenderness. There is no guarding. Musculoskeletal:       General: No swelling or deformity. Normal range of motion.    Right lower leg: No edema.    Left lower leg: No edema. Skin:   General: Skin is warm.    Findings: Rash (Mild erythema / edema of left eye lid; overall significantly improved; mild erythema over forehead) present.    Comments: Toes hyperpigmetned/?PVD - not newBruises, petechiae scattered Neurological:    Mental Status: He is alert and oriented to person, place, and time. Psychiatric:       Mood and Affect: Mood normal. Labs:No results found for this or  any previous visit (from the past 24 hour(s)).Diagnostics:No results found.Assessment: Douglas [name] is a 72 y.o. 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 He is s/p 2 cycles of chemotherapy with Decitabine + Venetoclax with reduction in blast count. 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  Plan:  Day +14EngraftedFolliculitis- resolvingConditioning/Transplant: (completed)-Melphalan 70 mg/m2 over 30 minutes on D-6, *Melphalan dosed reduced to 70 mg/m2 due to age-Fludarabine 40 mg/m2 over 30 minutes every 24hr for 4 doses from D-5 to D -2             -PT cyclophosphamide 25 mg/kg IV over 1.5 hrs every 24hr for 2 doses on D +3 & D +4 (reduced by 50% to 25 mg/kg due 10/10 match and age)-Mesna 50 mg/kg CIVI daily, for 2 doses, starting 15 minutes before cyclophosphamide on D +3 & D +4-Stem cell infusion day 0 (09/24/22) HEME: 1 unit plts.- Transfuse pRBCs to maintain Hb >7 g/dL, platelets >96 or if clinically indicated- Filgrastim 20mcg/kg starting on day +5 until stable engraftment of neutrophils.ID: Prophylactic antimicrobials: Acyclovir 400 mg PO TID (home medications),  Zosyn, Isavuconazole 372 mg po daily (prior authorization pending, voriconazole allergy-gingival hyperplasia), Pentamidine 300 mg inhalation monthly- CMV -/pending, attempt to get letermovir for CMV prophylaxis. Will send script on Day +7 to determine insurance coverage before starting inpatient.Check CMV PCR weekly.- If febrile, panculture, UA and CXR, start broad spectrum empiric antibiotics if neutropenic or septic appearing.   - Repeat blood cx every 24 hours with temperature > 100.4 or septic appearing GVHD Prophylaxis:  - Tacrolimus Goal of 6-11ng/ml- PT cyclophosphamide 25 mg/kg IV over 1.5 hrs every 24hr for 2 doses on D +3 & D +4 with mesna daily on Day +3 and Day +4  (PTCy dose reduced by 50% )- Monitor levels, haptoglobin, LDH, triglycerides.- Daily skin exam, liver function tests twice weekly (minimum), document stool output/day. Fluids, Electrolytes and Nutrition:- Regular diet, consider nutrition consult if needed- Daily weights- Strict Intake and output- Replete electrolytes PRNEmesis prophylaxis: Ondansetron 16 mg PO every 12 hours w/ PRN Compazine, Dexamethasone 12mg  po premed prior to melphalan High dose cyclophosphamide: doxepin premed 25 mg and diphenhydramine 25 mg 30 mins before Cytoxan; D5W 1/2NS + KCL 20 meq + Magnesium 1 gm alternating with D5W + of NaCHO3 + KCL 20 meq until 24 hours after last Cyclophosphamide dose (hold fluid during Cytoxan infusion)Diarrhea: imodium 2mg  PRN 4 times daily, lomotil 2.5-0.025 4x daily PRN  VOD: - Continue Ursodiol 500mg  PO BID, continue until Day +90- Daily weights, strict fluid monitoring of all intake/output- Monitor liver function tests and coags at least twice weekly HTN: Increased Norvasc to 10mg  in the setting of SBP 150s-160s. Continue to monitor Patient reports not taking his antiHTN meds for the last few days due to feeling weak. PTA. 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 2) Forehead erythema and left eyelid erythema and swelling with purpura on eyelid - improving so will hold off on bx per derm, felt to be hordeolum, however, given pt counts will continue empiric tx for cellulitis- F/u bx results- warm compresses PRN for left eye + will start erythromycin ointment for possible rosacea  CVAD: Hickman for chemotherapy  and supportive care Discharge Planning: Pending count recovery 24 hour caregiver:

## 2022-10-10 ENCOUNTER — Ambulatory Visit: Admit: 2022-10-10 | Payer: MEDICARE

## 2022-10-10 ENCOUNTER — Encounter: Admit: 2022-10-10 | Payer: PRIVATE HEALTH INSURANCE | Attending: Family

## 2022-10-10 DIAGNOSIS — Z9484 Stem cells transplant status: Secondary | ICD-10-CM

## 2022-10-10 DIAGNOSIS — C9200 Acute myeloblastic leukemia, not having achieved remission: Secondary | ICD-10-CM

## 2022-10-10 LAB — COMPREHENSIVE METABOLIC PANEL
BKR A/G RATIO: 1.3 (ref 1.0–2.2)
BKR ALANINE AMINOTRANSFERASE (ALT): 15 U/L (ref 9–59)
BKR ALBUMIN: 3.4 g/dL — ABNORMAL LOW (ref 3.6–5.1)
BKR ALKALINE PHOSPHATASE: 95 U/L (ref 9–122)
BKR ANION GAP: 10 (ref 7–17)
BKR ASPARTATE AMINOTRANSFERASE (AST): 23 U/L (ref 10–35)
BKR AST/ALT RATIO: 1.5
BKR BILIRUBIN TOTAL: 0.4 mg/dL (ref <=1.2)
BKR BLOOD UREA NITROGEN: 10 mg/dL (ref 8–23)
BKR BUN / CREAT RATIO: 11.8 (ref 8.0–23.0)
BKR CALCIUM: 8.8 mg/dL (ref 8.8–10.2)
BKR CHLORIDE: 108 mmol/L — ABNORMAL HIGH (ref 98–107)
BKR CO2: 22 mmol/L (ref 20–30)
BKR CREATININE: 0.85 mg/dL (ref 0.40–1.30)
BKR EGFR, CREATININE (CKD-EPI 2021): >60 mL/min/{1.73_m2} (ref >=60)
BKR GLOBULIN: 2.6 g/dL (ref 2.0–3.9)
BKR GLUCOSE: 100 mg/dL (ref 70–100)
BKR POTASSIUM: 3.7 mmol/L (ref 3.3–5.3)
BKR PROTEIN TOTAL: 6 g/dL (ref 5.9–8.3)
BKR SODIUM: 140 mmol/L (ref 136–144)

## 2022-10-10 LAB — IMMATURE PLATELET FRACTION (BH GH LMW YH)
BKR WAM IPF, ABSOLUTE: 3.9 x1000/ÂµL (ref <20.0)
BKR WAM IPF: 12.1 % — ABNORMAL HIGH (ref 1.2–8.6)

## 2022-10-10 LAB — MANUAL DIFFERENTIAL
BKR WAM BANDS (DIFF) (1 DEC): 1.1 % (ref 0.0–10.0)
BKR WAM BASOPHIL - ABS (DIFF) 2 DEC: 0.04 x 1000/ÂµL (ref 0.00–1.00)
BKR WAM BASOPHILS (DIFF): 1 % (ref 0.0–1.4)
BKR WAM EOSINOPHILS (DIFF) 2 DEC: 0 x 1000/ÂµL (ref 0.00–1.00)
BKR WAM EOSINOPHILS (DIFF): 0 % (ref 0.0–5.0)
BKR WAM LYMPHOCYTE - ABS (DIFF) 2 DEC: 0.04 x 1000/ÂµL — ABNORMAL LOW (ref 0.60–3.70)
BKR WAM LYMPHOCYTES (DIFF): 1.1 % — ABNORMAL LOW (ref 17.0–50.0)
BKR WAM METAMYELOCYTES (DIFF) 1 DEC: 6.4 % — ABNORMAL HIGH (ref 0.0–1.0)
BKR WAM MONOCYTE - ABS (DIFF) 2 DEC: 0.97 x 1000/ÂµL (ref 0.00–1.00)
BKR WAM MONOCYTES (DIFF): 26.6 % — ABNORMAL HIGH (ref 4.0–12.0)
BKR WAM MYELOCYTES (DIFF) 1 DEC: 5.3 % — ABNORMAL HIGH (ref 0.0–0.0)
BKR WAM NEUTROPHILS (DIFF): 55.3 % (ref 39.0–72.0)
BKR WAM NEUTROPHILS - ABS (DIFF) 2 DEC: 2.05 x 1000/ÂµL (ref 2.00–7.60)
BKR WAM NORMAL RBC MORPHOLOGY: NORMAL
BKR WAM PROMYELOCYTES (DIFF) 1 DEC: 3.2 % — ABNORMAL HIGH (ref 0.0–0.0)

## 2022-10-10 LAB — CBC WITH AUTO DIFFERENTIAL
BKR WAM ABSOLUTE NRBC (2 DEC): 0.08 x 1000/ÂµL (ref 0.00–1.00)
BKR WAM HEMATOCRIT (2 DEC): 23.9 % — ABNORMAL LOW (ref 38.50–50.00)
BKR WAM HEMOGLOBIN: 8 g/dL — ABNORMAL LOW (ref 13.2–17.1)
BKR WAM MCH (PG): 28.2 pg (ref 27.0–33.0)
BKR WAM MCHC: 33.5 g/dL (ref 31.0–36.0)
BKR WAM MCV: 84.2 fL (ref 80.0–100.0)
BKR WAM NUCLEATED RED BLOOD CELLS: 2.2 % — ABNORMAL HIGH (ref 0.0–1.0)
BKR WAM PLATELETS: 32 x1000/ÂµL — ABNORMAL LOW (ref 150–420)
BKR WAM RDW-CV: 16.3 % — ABNORMAL HIGH (ref 11.0–15.0)
BKR WAM RED BLOOD CELL COUNT.: 2.84 M/ÂµL — ABNORMAL LOW (ref 4.00–6.00)
BKR WAM WHITE BLOOD CELL COUNT: 3.6 x1000/ÂµL — ABNORMAL LOW (ref 4.0–11.0)

## 2022-10-10 LAB — TACROLIMUS LEVEL     (BH GH L LMW YH): BKR TACROLIMUS BLOOD: 8.9 ng/mL

## 2022-10-10 MED ORDER — MAGNESIUM OXIDE 400 MG (241.3 MG MAGNESIUM) TABLET
400241.3 mg (241.3 mg magnesium) | ORAL_TABLET | Freq: Two times a day (BID) | ORAL | Status: AC
Start: 2022-10-10 — End: 2022-10-13
  Filled 2022-10-10: qty 180, 90d supply, fill #0

## 2022-10-10 MED FILL — TACROLIMUS IMMEDIATE RELEASE 0.5 MG CAPSULE: 0.5 mg | ORAL | 30 days supply | Qty: 60 | Fill #0 | Status: CP

## 2022-10-10 MED FILL — TACROLIMUS IMMEDIATE RELEASE 1 MG CAPSULE: 1 mg | ORAL | 30 days supply | Qty: 60 | Fill #0 | Status: CP

## 2022-10-10 NOTE — Plan of Care
Plan of Care Overview/ Patient Status    0700-1500: Pt Aox4, VSS, afebrile. RDL Hickman dated 8/18, c/d/I, patent w/ +BR, SL. No pain/n/v. LBM 8/23, diarrhea. Lungs clear on RA. Skin intact. Pt ambulates ad lib, steady gait noted. Call bell w/I reach, will continue to monitor. Creig Landin, RNProblem: Adult Inpatient Plan of CareGoal: Plan of Care ReviewOutcome: Interventions implemented as appropriateGoal: Patient-Specific Goal (Individualized)Outcome: Interventions implemented as appropriateGoal: Absence of Hospital-Acquired Illness or InjuryOutcome: Interventions implemented as appropriateGoal: Optimal Comfort and WellbeingOutcome: Interventions implemented as appropriateGoal: Readiness for Transition of CareOutcome: Interventions implemented as appropriate Problem: Fall Injury RiskGoal: Absence of Fall and Fall-Related InjuryOutcome: Interventions implemented as appropriate

## 2022-10-10 NOTE — Plan of Care
Plan of Care Overview/ Patient Status    0700-1630Pt is D+16 of an ALLO. A&Ox4. VSS. Afebrile. Lungs diminished on RA. No c/o pain or N/V/D this shift. RDL Hickman 8/18 flushes w/o difficulty, +BR, dressing CDI. 3.7 K+ repleted w/ 40 mEq of potassium chloride in sterile water during this shift. No transfusions during this shift. Skin intact. Voiding spontaneously. LBM 8/24. Independent OOB. Patient able to turn and reposition in bed every 2 hours, currently resting in bed. Neutropenic/Safety/Fall precautions set and maintained. Personal items and call bell within reach. Care plan maintained throughout shift. DC education and paperwork provided to pt and spouse. Douglas [name], RNProblem: Adult Inpatient Plan of CareGoal: Plan of Care ReviewOutcome: Interventions implemented as appropriateGoal: Patient-Specific Goal (Individualized)Outcome: Interventions implemented as appropriateGoal: Absence of Hospital-Acquired Illness or InjuryOutcome: Interventions implemented as appropriateGoal: Optimal Comfort and WellbeingOutcome: Interventions implemented as appropriateGoal: Readiness for Transition of CareOutcome: Interventions implemented as appropriate

## 2022-10-10 NOTE — Discharge Instructions
Call service at [phone]  for the following:Mental status changes, weakness that is new, chest pain, heart palpitations, cough, shortness of breath, nausea, vomiting, diarrhea ,constipation, abdominal pain, pain with voiding, rash, fever >/+ 100.4, chills or bleeding. Regular DietBathe DailyCall if you can not take your medication as directed. Physician Discharge Activity Instructions:? 	Discharge Activity Orders Walk as much as you can, Out of bed as much as you can, No driving, Shower once a day ? 	May return to work No ? 	May return to school No ? 	May return to sports/PE No

## 2022-10-10 NOTE — Plan of Care
Plan of Care Overview/ Patient Status    1900-0700This shift, Mr. Dai was AOx4, afebrile, independent OOB, on room air.RDL Hickman patent with +BR maintained, dressing c/d/i.No c/o pain/n/v/c.Takes pills whole with water, voiding spontaneously.Fall/safety precautions maintained, call bell/belongings within reach, turns and repositions self.Please see flowsheet and plan of care for additional information.Marlin Canary, RN

## 2022-10-11 ENCOUNTER — Encounter: Admit: 2022-10-11 | Payer: PRIVATE HEALTH INSURANCE

## 2022-10-12 NOTE — Other
DISCHARGE MEDICATION RECONCILIATIONAfter reviewing the home medication list, I have identified the following:Discontinued/On Hold Medications:AnidulafunginAspirinAtorvastatinChlorhexidine CholecalciferolCoenzyme Q10Dexamethasone solutionLevofloxacinLidocain 2%Lisinopril MetoprololVoriconazole  Continued MedicationsAcyclovir 400 mg po twice dailyAmlodipine 10 mg po dailyFinasteride 5 mg po nightlyLatanoprost 0.005% Added MedicationsIsavuconazole 372 mg po dailyFolic acid 1 mg po dailyMagnesium oxide 400 mg po twice dailyMVI dailyNS flushes dailyOndansetron 8 mg po every 8 hours prn N/VPentamidine 300 mg inhalation monthly, last given 8/3Tacrolimus 6.5 mg po twice dailyUrsodiol 500 mg po twice daily Patient discharged with both strengths of tacrolimus (0.5mg , 1mg  & 5 mg capsules) on hand due to potential for frequent dose adjustments.Home medication list updated and inpatient orders reviewed.Recommendations:  GvHDTacrolimus level on discharge 8.9 - continued 6.5 mg twice dailyNo MMFPTCy 25 mg/kgIDPentamidine due 9/2Developed ginivial changes on voriconazole, changed to cresemba prior to transplant.  Repeal approved for $0 copayCMV -/-, no letermovir, last checked 8/22 negativeCardiacContinues on amlodipine, still continues to hold metoprolol and lisinopril.  Will need to be reassessed when to restartAspirin for cardiac px on hold due to plts.OtherNS flushes through OPS Thank you,Jovanni Eckhart, PharmD08/25/24 8:57 PM

## 2022-10-13 ENCOUNTER — Ambulatory Visit: Admit: 2022-10-13 | Payer: MEDICARE

## 2022-10-13 ENCOUNTER — Ambulatory Visit: Admit: 2022-10-13 | Discharge: 2022-10-13 | Payer: MEDICARE

## 2022-10-13 ENCOUNTER — Ambulatory Visit: Admit: 2022-10-13 | Payer: MEDICARE | Attending: Oncology

## 2022-10-13 ENCOUNTER — Encounter: Admit: 2022-10-13 | Payer: PRIVATE HEALTH INSURANCE

## 2022-10-13 ENCOUNTER — Encounter: Admit: 2022-10-13 | Payer: PRIVATE HEALTH INSURANCE | Attending: Family

## 2022-10-13 DIAGNOSIS — R0609 Other forms of dyspnea: Secondary | ICD-10-CM

## 2022-10-13 DIAGNOSIS — C9200 Acute myeloblastic leukemia, not having achieved remission: Secondary | ICD-10-CM

## 2022-10-13 DIAGNOSIS — F32A Depression: Secondary | ICD-10-CM

## 2022-10-13 DIAGNOSIS — I10 Essential (primary) hypertension: Secondary | ICD-10-CM

## 2022-10-13 DIAGNOSIS — N400 Enlarged prostate without lower urinary tract symptoms: Secondary | ICD-10-CM

## 2022-10-13 DIAGNOSIS — H409 Unspecified glaucoma: Secondary | ICD-10-CM

## 2022-10-13 DIAGNOSIS — Z9484 Stem cells transplant status: Secondary | ICD-10-CM

## 2022-10-13 DIAGNOSIS — F419 Anxiety disorder, unspecified: Secondary | ICD-10-CM

## 2022-10-13 DIAGNOSIS — C931 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: Secondary | ICD-10-CM

## 2022-10-13 DIAGNOSIS — I119 Hypertensive heart disease without heart failure: Secondary | ICD-10-CM

## 2022-10-13 DIAGNOSIS — E782 Mixed hyperlipidemia: Secondary | ICD-10-CM

## 2022-10-13 DIAGNOSIS — Z8249 Family history of ischemic heart disease and other diseases of the circulatory system: Secondary | ICD-10-CM

## 2022-10-13 DIAGNOSIS — C9110 Chronic lymphocytic leukemia of B-cell type not having achieved remission: Secondary | ICD-10-CM

## 2022-10-13 DIAGNOSIS — C4491 Basal cell carcinoma of skin, unspecified: Secondary | ICD-10-CM

## 2022-10-13 LAB — CBC WITH AUTO DIFFERENTIAL
BKR WAM ABSOLUTE NRBC (2 DEC): 0.11 x 1000/ÂµL (ref 0.00–1.00)
BKR WAM HEMATOCRIT (2 DEC): 25.1 % — ABNORMAL LOW (ref 38.50–50.00)
BKR WAM HEMOGLOBIN: 8.3 g/dL — ABNORMAL LOW (ref 13.2–17.1)
BKR WAM MCH (PG): 28.2 pg (ref 27.0–33.0)
BKR WAM MCHC: 33.1 g/dL (ref 31.0–36.0)
BKR WAM MCV: 85.4 fL (ref 80.0–100.0)
BKR WAM MPV: 3.6 g/dL — ABNORMAL LOW (ref 3.6–5.1)
BKR WAM NUCLEATED RED BLOOD CELLS: 2.7 % — ABNORMAL HIGH (ref 0.0–1.0)
BKR WAM PLATELETS: 44 x1000/ÂµL — ABNORMAL LOW (ref 150–420)
BKR WAM RDW-CV: 16.8 % — ABNORMAL HIGH (ref 11.0–15.0)
BKR WAM RED BLOOD CELL COUNT.: 2.94 M/ÂµL — ABNORMAL LOW (ref 4.00–6.00)
BKR WAM WHITE BLOOD CELL COUNT: 3.9 x1000/ÂµL — ABNORMAL LOW (ref 4.0–11.0)

## 2022-10-13 LAB — RETICULOCYTES
BKR WAM IRF: 34.7 % — ABNORMAL HIGH (ref 3.0–15.9)
BKR WAM RETICULOCYTE - ABS (3 DEC): 0.084 10Ë6 cells/uL (ref 0.023–0.140)
BKR WAM RETICULOCYTE COUNT PCT (1 DEC): 2.9 % — ABNORMAL HIGH (ref 0.6–2.7)
BKR WAM RETICULOCYTE HGB EQUIVALENT: 35.9 pg — ABNORMAL HIGH (ref 28.2–35.7)

## 2022-10-13 LAB — MANUAL DIFFERENTIAL
BKR WAM ATYPICAL LYMPHOCYTES (DIFF) 1 DEC: 0.9 % (ref 0.0–1.0)
BKR WAM BANDS (DIFF) (1 DEC): 3.7 % (ref 0.0–10.0)
BKR WAM BASOPHIL - ABS (DIFF) 2 DEC: 0.07 x 1000/ÂµL (ref 0.00–1.00)
BKR WAM BASOPHILS (DIFF): 1.8 % — ABNORMAL HIGH (ref 0.0–1.4)
BKR WAM EOSINOPHILS (DIFF) 2 DEC: 0 x 1000/ÂµL (ref 0.00–1.00)
BKR WAM EOSINOPHILS (DIFF): 0 % (ref 0.0–5.0)
BKR WAM LYMPHOCYTE - ABS (DIFF) 2 DEC: 0.15 x 1000/ÂµL — ABNORMAL LOW (ref 0.60–3.70)
BKR WAM LYMPHOCYTES (DIFF): 3.7 % — ABNORMAL LOW (ref 17.0–50.0)
BKR WAM METAMYELOCYTES (DIFF) 1 DEC: 1.9 % — ABNORMAL HIGH (ref 0.0–1.0)
BKR WAM MONOCYTE - ABS (DIFF) 2 DEC: 0.98 x 1000/ÂµL (ref 0.00–1.00)
BKR WAM MONOCYTES (DIFF): 25 % — ABNORMAL HIGH (ref 4.0–12.0)
BKR WAM MYELOCYTES (DIFF) 1 DEC: 2.8 % — ABNORMAL HIGH (ref 0.0–0.0)
BKR WAM NEUTROPHILS (DIFF): 57.4 % (ref 39.0–72.0)
BKR WAM NEUTROPHILS - ABS (DIFF) 2 DEC: 2.4 x 1000/ÂµL (ref 2.00–7.60)
BKR WAM PROMYELOCYTES (DIFF) 1 DEC: 2.8 % — ABNORMAL HIGH (ref 0.0–0.0)

## 2022-10-13 LAB — COMPREHENSIVE METABOLIC PANEL
BKR A/G RATIO: 1.2 (ref 1.0–2.2)
BKR ALANINE AMINOTRANSFERASE (ALT): 22 U/L (ref 9–59)
BKR ALBUMIN: 3.6 g/dL (ref 3.6–5.1)
BKR ALKALINE PHOSPHATASE: 95 U/L (ref 9–122)
BKR ANION GAP: 14 (ref 7–17)
BKR ASPARTATE AMINOTRANSFERASE (AST): 29 U/L (ref 10–35)
BKR AST/ALT RATIO: 1.3
BKR BILIRUBIN TOTAL: 0.4 mg/dL — ABNORMAL HIGH (ref 0.0–<=1.2)
BKR BLOOD UREA NITROGEN: 11 mg/dL (ref 8–23)
BKR BUN / CREAT RATIO: 12.1 % — ABNORMAL HIGH (ref 8.0–23.0)
BKR CALCIUM: 8.8 mg/dL (ref 8.8–10.2)
BKR CHLORIDE: 105 mmol/L (ref 98–107)
BKR CO2: 20 mmol/L — ABNORMAL LOW (ref 20–30)
BKR CREATININE: 0.91 mg/dL (ref 0.40–1.30)
BKR EGFR, CREATININE (CKD-EPI 2021): >60 mL/min/{1.73_m2} (ref >=60)
BKR GLOBULIN: 3 g/dL (ref 2.0–3.9)
BKR GLUCOSE: 109 mg/dL — ABNORMAL HIGH (ref 70–100)
BKR POTASSIUM: 3.6 mmol/L (ref 3.3–5.3)
BKR PROTEIN TOTAL: 6.6 g/dL — ABNORMAL LOW (ref 5.9–8.3)
BKR SODIUM: 139 mmol/L (ref 136–144)

## 2022-10-13 LAB — PHOSPHORUS     (BH GH L LMW YH): BKR PHOSPHORUS: 2.1 mg/dL — ABNORMAL LOW (ref 2.2–4.5)

## 2022-10-13 LAB — IMMATURE PLATELET FRACTION (BH GH LMW YH)
BKR WAM IPF, ABSOLUTE: 5.3 x1000/ÂµL (ref <20.0)
BKR WAM IPF: 12.1 % — ABNORMAL HIGH (ref 1.2–8.6)

## 2022-10-13 LAB — MAGNESIUM: BKR MAGNESIUM: 1.6 mg/dL — ABNORMAL LOW (ref 1.7–2.4)

## 2022-10-13 LAB — TACROLIMUS LEVEL     (BH GH L LMW YH): BKR TACROLIMUS BLOOD: 15.5 ng/mL

## 2022-10-13 LAB — PT/INR AND PTT (BH GH L LMW YH)
BKR INR: 1.14 — ABNORMAL HIGH (ref 0.86–1.12)
BKR PARTIAL THROMBOPLASTIN TIME: 26.2 s (ref 23.0–31.4)
BKR PROTHROMBIN TIME: 12.5 seconds — ABNORMAL HIGH (ref 9.6–12.3)

## 2022-10-13 LAB — HAPTOGLOBIN: BKR HAPTOGLOBIN: 210 mg/dL — ABNORMAL HIGH (ref 30–200)

## 2022-10-13 LAB — LACTATE DEHYDROGENASE: BKR LACTATE DEHYDROGENASE: 452 U/L — ABNORMAL HIGH (ref 122–241)

## 2022-10-13 LAB — TRIGLYCERIDES: BKR TRIGLYCERIDES: 174 mg/dL — ABNORMAL HIGH (ref 23.0–31.4)

## 2022-10-13 MED ORDER — PROCHLORPERAZINE MALEATE 10 MG TABLET
10 | ORAL_TABLET | Freq: Four times a day (QID) | ORAL | 4 refills | Status: AC | PRN
Start: 2022-10-13 — End: ?

## 2022-10-13 MED ORDER — SODIUM CHLORIDE 0.9 % BOLUS (NEW BAG)
0.9 | Freq: Once | INTRAVENOUS | Status: CP | PRN
Start: 2022-10-13 — End: ?
  Administered 2022-10-13: 15:00:00 0.9 mL/h via INTRAVENOUS

## 2022-10-13 MED ORDER — MAGNESIUM 400 MG (AS MAGNESIUM OXIDE) CAPSULE
400 | Freq: Every day | ORAL | Status: AC
Start: 2022-10-13 — End: ?

## 2022-10-13 MED ORDER — PENTAMIDINE 300 MG SOLUTION FOR INHALATION
300 | RESPIRATORY_TRACT | Status: AC
Start: 2022-10-13 — End: ?

## 2022-10-13 NOTE — Progress Notes
Day + 19 post MUD for his AML.Reports fatigue and improving appetite.No fever/chills; has loose stools-unable to give sample here in clinic.No SOB; no cough nor nasal congestion.RDLH dressing and claves changed.Labs drawn-given 500 cc of saline for hydration.Seen by MD [place] Jansky changes on medication dosing at this time.RTC on Friday.

## 2022-10-13 NOTE — Other
Post-HSCT Follow-Up Pharmacist note Diagnosis: CMMLConditioning:   Fludarabine/Melphalan with post-transplant Cyclophosphamide Transplant Type:10/10 MUD Allogeneic PBSCTTransplant Date (Day 0): 09/24/22 Referring Provider: Chrystine Oiler, MDEncounter Date: 10/13/2022 = D+19[name] is a 72 y.o. year-old male with history of CMML s/p HSCT. Patient is being seen by the clinic pharmacist for medication management included post stem cell transplant, hematological related disorders and GI related disorders .  This visit was completed as a joint-visit with provider present and is aware of the care plan.Subjective: Notes multiple episodes of soft stools per day, however improving since discharge. Inquiring if he can utilize loperamideComplains of intermittent nausea, took one dose ondansetron last night with good effect but notes that prochlorperazine worked better for him inpatient. Also had acid reflux yesterday after eating spicy food, asking if he can utilize famotidine as neededBrought in OTC magnesium oxide capsule, asking if he can utilize these instead of what he received on dischargedWas not aware to hold tacrolimus dose prior to clinic visits today Assessment and Recommendations:  Summary of Medication Changes and Follow-up Items Discussed with Provider:Educated on importance to hold tacrolimus dose prior to clinic visits to obtain accurate trough level. Patient and wife confirmed understandingProchlorperazine script sent to requested pharmacy to use as needed for nauseaConfirmed okay to utilize patient's OTC magnesium - same product just different formulation of magnesium oxide 400mg  capsules.Previously on aspirin, re-assess if need to restart once platelets recoverDiscussed option to space AM pills out and move the daily medications to lunch time to relieve the pill burden with morning medsGVHD (immunosuppression)Tacrolimus: Current dose: 6.5mg  PO twice daily08/27/24  level: not accurate level, took AM doseGoal range: 6-11 ng/mLDosage dispensed: 0.5mg , 1mg , 5mg  capsulesPharmacy dispensed: OPSPost transplant cyclophosphamide: completed inpatient *All immunosuppression is managed by the bone marrow transplant team.Infectious Disease (prophylaxis, monitoring, and active infections)ViralCurrent prophylaxis/treatment: Acyclovir 400mg  PO twice dailyPrevious viral infections: 	HSV/VZV +/?CMV -/?, 8/22 CMV negativeEBV IgG negativeBK virus N/APneumocystis jeuvecii pneumoniaCurrent prophylaxis/treatment: Pentamidine 300mg  inhaled monthly last given 8/3/24Previous PJP infection: N/AT-cell subsetDay +100 (date):Day +180 (date):1 year (date):Toxoplasmosis IgG negativeCurrent prophylaxis/treatment: NoneFungal/MoldCurrent prophylaxis/treatment: Isavuconazole 372mg  PO daily - history of prolonged cytopenias Previous fungal/mold infection: N/A, Hx of gingival changes to voriconazole Drug monitoring: N/AGalactomann - 10/01/22 negFungitell - 10/01/22 < 31Bacterial	Current prophylaxis/treatment: None	Previous bacterial infection: N/A	Drug monitoring: N/A	Blood cultures: N/ASupportive careHyperlipidemia monitoring (triglycerides, Cholesterol, LDL, HDL)Current treatment: None	Prior treatment: Atorvastatin 40mg  PO daily, held with transplantBaseline (09/18/2022): 68 Today (10/13/22): 174Hypertension monitoring (blood pressure)Current treatment: Amlodipine 10mg  PO dailyPrior treatment: Metoprolol and lisinopril on hold since transplant Today (10/13/22):  134/76GastrointestionalStress ulcer prophylaxis: None, one episode of acid refluxNausea/Vomiting: Intermittent nausea, takes ondansetron with good effect but prefers prochlorperazine, PRN script sent 8/27/24Diarrhea/constipation: Continues to have soft stools, improving since dischargeOral care and mucositis: NoneVeno-occlusive diseaseCurrent treatment:  Ursodiol 500mg  PO twice daily until day +90Anticoagulation	Current prophylaxis/treatment: None	Previous anticoagulation history: Aspirin 81mg  PO daily, for cardiac ppxDrug monitoring: platelets 10/13/22 44Electrolyte supplementation:	Current treatment: Magnesium oxide 400mg  BID	Monitoring: Mg 1.6 08/27/24Hypogammaglobulinemia	Current treatment:	Goal: 500-800 ng/dLIVIG last dose:Baseline IgG level:Last IgG level:IgG scheduled levelsDay +100 (date):Day +180 (date):	1 year (date):	Maintenance Therapy Plan: VaccinationsBaseline titers: 	Hepatitis B: 	Pneumococcal: 	VZV 	Measles: , Mumps: , Rubella: 12 month: 14 month: 18 month:	Hep B titer	Pneumococcal titers24 month:Titers drawn 2 months after 24 months:Redose needed:Medication History:Douglas [name] presents to today's visit with their med action plan.  A medication history was obtained from the patient and spouse.Adherence:The patient reports 0 missed doses of medications in the past 1 week(s). The patient does not report the barriers to adherence. Discussed potential outcomes associated with non-adherenceAffordability:The patient denies  concerns with affordability of medications at this time. Medication Management:Plan for medication management assistance: An updated medication action plan was not given to the patient, no changesPatient would benefit from continued pharmacy follow-up at future clinic visits for medication managementEducated patient on the above changesPatient did not request refills todayObjective: ZOX:WRUEAV Results (from the past 168 hour(s)) Manual Differential  Collection Time: 10/13/22 10:48 AM Result Value Ref Range  Neutrophils 57.4 39.0 - 72.0 %  Bands 3.7 0.0 - 10.0 %  Lymphocytes 3.7 (L) 17.0 - 50.0 %  Monocytes 25.0 (H) 4.0 - 12.0 %  Eosinophils 0.0 0.0 - 5.0 %  Basophil 1.8 (H) 0.0 - 1.4 %  Metamyelocytes 1.9 (H) 0.0 - 1.0 %  Myelocytes 2.8 (H) 0.0 - 0.0 %  Promyelocytes 2.8 (H) 0.0 - 0.0 %  Atypical Lymphocytes 0.9 0.0 - 1.0 %  Neutrophils Absolute 2.40 2.00 - 7.60 x 1000/?L  Lymphocyte Absolute 0.15 (L) 0.60 - 3.70 x 1000/?L  Monocyte Absolute Count 0.98 0.00 - 1.00 x 1000/?L  Eosinophil Absolute Count 0.00 0.00 - 1.00 x 1000/?L  Basophil Absolute Count 0.07 0.00 - 1.00 x 1000/?L  RBC Morphology Reviewed   Ovalocytes 1+ (A) None  Polychromasia 1+ (A) None  Tear Drop Cells 1+ (A) None CBC auto differential  Collection Time: 10/13/22 10:48 AM Result Value Ref Range  WBC 3.9 (L) 4.0 - 11.0 x1000/?L  RBC 2.94 (L) 4.00 - 6.00 M/?L  Hemoglobin 8.3 (L) 13.2 - 17.1 g/dL  Hematocrit 40.98 (L) 11.91 - 50.00 %  MCV 85.4 80.0 - 100.0 fL  MCH 28.2 27.0 - 33.0 pg  MCHC 33.1 31.0 - 36.0 g/dL  RDW-CV 47.8 (H) 29.5 - 15.0 %  Platelets 44 (L) 150 - 420 x1000/?L  MPV    nRBC 2.7 (H) 0.0 - 1.0 %  Absolute nRBC 0.11 0.00 - 1.00 x 1000/?L AOZ:HYQMVH Results (from the past 168 hour(s)) Comprehensive metabolic panel  Collection Time: 10/13/22 10:48 AM Result Value Ref Range  Sodium 139 136 - 144 mmol/L  Potassium 3.6 3.3 - 5.3 mmol/L  Chloride 105 98 - 107 mmol/L  CO2 20 20 - 30 mmol/L  Anion Gap 14 7 - 17  Glucose 109 (H) 70 - 100 mg/dL  BUN 11 8 - 23 mg/dL  Creatinine 8.46 9.62 - 1.30 mg/dL  Calcium 8.8 8.8 - 95.2 mg/dL  BUN/Creatinine Ratio 84.1 8.0 - 23.0  Total Protein 6.6 5.9 - 8.3 g/dL  Albumin 3.6 3.6 - 5.1 g/dL  Total Bilirubin 0.4 <=3.2 mg/dL  Alkaline Phosphatase 95 9 - 122 U/L  Alanine Aminotransferase (ALT) 22 9 - 59 U/L  Aspartate Aminotransferase (AST) 29 10 - 35 U/L  Globulin 3.0 2.0 - 3.9 g/dL  A/G Ratio 1.2 1.0 - 2.2  AST/ALT Ratio 1.3 Reference Range Not Established  eGFR (Creatinine) >60 >=60 mL/min/1.44m2 LDH: Recent Results (from the past 168 hour(s)) Lactate dehydrogenase Collection Time: 10/13/22 10:48 AM Result Value Ref Range  LD 452 (H) 122 - 241 U/L Patient Education: The following education was provided for: Initiation, modification, or discontinuation of drug therapy, Assessment of efficacy and safety of drug therapy, and Adverse effect presentation, evaluation, and monitoring during today?s visit to the patient and spouse regarding medication management: purpose of each medication, side effects of medications, and importance of adherence with the regimen(s) Time Spent: 15 minutes, face-to-face consultPATIENT AND/OR CAREGIVER VERBALIZED UNDERSTANDING OF CARE PLAN: yesPATIENT ADVISED TO CALL BACK WITH QUESTIONS, CONCERNS, OR CHANGE IN SYMPTOMS.Signed by: Vevelyn Francois, PharmD, BCOP08/27/24  11:19 AM

## 2022-10-14 ENCOUNTER — Ambulatory Visit: Admit: 2022-10-14 | Payer: MEDICARE

## 2022-10-14 LAB — CYTOMEGALOVIRUS QUANTITATIVE PCR, PLASMA     (BH GH L LMW YH): BKR CMV QUANTITATIVE PCR: NOT DETECTED

## 2022-10-15 ENCOUNTER — Ambulatory Visit: Admit: 2022-10-15 | Payer: MEDICARE | Attending: Hematology

## 2022-10-16 ENCOUNTER — Ambulatory Visit: Admit: 2022-10-16 | Payer: MEDICARE

## 2022-10-16 ENCOUNTER — Encounter: Admit: 2022-10-16 | Payer: PRIVATE HEALTH INSURANCE | Attending: Medical Oncology

## 2022-10-16 ENCOUNTER — Encounter: Admit: 2022-10-16 | Payer: PRIVATE HEALTH INSURANCE

## 2022-10-16 ENCOUNTER — Ambulatory Visit: Admit: 2022-10-16 | Discharge: 2022-10-16 | Payer: MEDICARE

## 2022-10-16 DIAGNOSIS — E782 Mixed hyperlipidemia: Secondary | ICD-10-CM

## 2022-10-16 DIAGNOSIS — C931 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: Secondary | ICD-10-CM

## 2022-10-16 DIAGNOSIS — Z79621 Long term (current) use of calcineurin inhibitor: Secondary | ICD-10-CM

## 2022-10-16 DIAGNOSIS — I10 Essential (primary) hypertension: Secondary | ICD-10-CM

## 2022-10-16 DIAGNOSIS — R0609 Other forms of dyspnea: Secondary | ICD-10-CM

## 2022-10-16 DIAGNOSIS — Z79624 Long term (current) use of inhibitors of nucleotide synthesis: Secondary | ICD-10-CM

## 2022-10-16 DIAGNOSIS — Z9481 Bone marrow transplant status: Secondary | ICD-10-CM

## 2022-10-16 DIAGNOSIS — R5383 Other fatigue: Secondary | ICD-10-CM

## 2022-10-16 DIAGNOSIS — N400 Enlarged prostate without lower urinary tract symptoms: Secondary | ICD-10-CM

## 2022-10-16 DIAGNOSIS — Z79899 Other long term (current) drug therapy: Secondary | ICD-10-CM

## 2022-10-16 DIAGNOSIS — C9110 Chronic lymphocytic leukemia of B-cell type not having achieved remission: Secondary | ICD-10-CM

## 2022-10-16 DIAGNOSIS — Z9484 Stem cells transplant status: Secondary | ICD-10-CM

## 2022-10-16 DIAGNOSIS — F419 Anxiety disorder, unspecified: Secondary | ICD-10-CM

## 2022-10-16 DIAGNOSIS — R634 Abnormal weight loss: Secondary | ICD-10-CM

## 2022-10-16 DIAGNOSIS — R638 Other symptoms and signs concerning food and fluid intake: Secondary | ICD-10-CM

## 2022-10-16 DIAGNOSIS — D84821 Immunodeficiency due to drugs: Secondary | ICD-10-CM

## 2022-10-16 DIAGNOSIS — Z5181 Encounter for therapeutic drug level monitoring: Secondary | ICD-10-CM

## 2022-10-16 DIAGNOSIS — R630 Anorexia: Secondary | ICD-10-CM

## 2022-10-16 DIAGNOSIS — R5381 Other malaise: Secondary | ICD-10-CM

## 2022-10-16 DIAGNOSIS — Z8249 Family history of ischemic heart disease and other diseases of the circulatory system: Secondary | ICD-10-CM

## 2022-10-16 DIAGNOSIS — H409 Unspecified glaucoma: Secondary | ICD-10-CM

## 2022-10-16 DIAGNOSIS — C9200 Acute myeloblastic leukemia, not having achieved remission: Secondary | ICD-10-CM

## 2022-10-16 DIAGNOSIS — R21 Rash and other nonspecific skin eruption: Secondary | ICD-10-CM

## 2022-10-16 DIAGNOSIS — I119 Hypertensive heart disease without heart failure: Secondary | ICD-10-CM

## 2022-10-16 DIAGNOSIS — C4491 Basal cell carcinoma of skin, unspecified: Secondary | ICD-10-CM

## 2022-10-16 DIAGNOSIS — F32A Depression: Secondary | ICD-10-CM

## 2022-10-16 LAB — COMPREHENSIVE METABOLIC PANEL
BKR A/G RATIO: 1 (ref 1.0–2.2)
BKR ALANINE AMINOTRANSFERASE (ALT): 19 U/L (ref 9–59)
BKR ALBUMIN: 3.4 g/dL — ABNORMAL LOW (ref 3.6–5.1)
BKR ALKALINE PHOSPHATASE: 98 U/L (ref 9–122)
BKR ANION GAP: 13 (ref 7–17)
BKR ASPARTATE AMINOTRANSFERASE (AST): 28 U/L (ref 10–35)
BKR AST/ALT RATIO: 1.5 % — ABNORMAL HIGH (ref 0.0–1.0)
BKR BILIRUBIN TOTAL: 0.4 mg/dL (ref 39.0–<=1.2)
BKR BLOOD UREA NITROGEN: 11 mg/dL (ref 8–23)
BKR BUN / CREAT RATIO: 11.2 (ref 8.0–23.0)
BKR CALCIUM: 8.9 mg/dL (ref 8.8–10.2)
BKR CHLORIDE: 104 mmol/L (ref 98–107)
BKR CO2: 22 mmol/L — ABNORMAL LOW (ref 20–30)
BKR CREATININE: 0.98 mg/dL (ref 0.40–1.30)
BKR EGFR, CREATININE (CKD-EPI 2021): >60 mL/min/{1.73_m2} — ABNORMAL LOW (ref >=60–3.70)
BKR GLOBULIN: 3.3 g/dL (ref 2.0–3.9)
BKR GLUCOSE: 147 mg/dL — ABNORMAL HIGH (ref 70–100)
BKR POTASSIUM: 3.7 mmol/L (ref 3.3–5.3)
BKR PROTEIN TOTAL: 6.7 g/dL — ABNORMAL LOW (ref 5.9–8.3)
BKR SODIUM: 139 mmol/L (ref 136–144)

## 2022-10-16 LAB — CBC WITH AUTO DIFFERENTIAL
BKR WAM ABSOLUTE IMMATURE GRANULOCYTES.: 0.12 x 1000/ÂµL (ref 0.00–0.30)
BKR WAM ABSOLUTE LYMPHOCYTE COUNT.: 0.33 x 1000/ÂµL — ABNORMAL LOW (ref 0.60–3.70)
BKR WAM ABSOLUTE NRBC (2 DEC): 0.05 x 1000/ÂµL (ref 0.00–1.00)
BKR WAM ANC (ABSOLUTE NEUTROPHIL COUNT): 1.31 x 1000/ÂµL — ABNORMAL LOW (ref 2.00–7.60)
BKR WAM BASOPHIL ABSOLUTE COUNT.: 0.04 x 1000/ÂµL (ref 0.00–1.00)
BKR WAM BASOPHILS: 1.3 % (ref 0.0–1.4)
BKR WAM EOSINOPHIL ABSOLUTE COUNT.: 0 x 1000/ÂµL (ref 0.00–1.00)
BKR WAM EOSINOPHILS: 0 % (ref 0.0–5.0)
BKR WAM HEMATOCRIT (2 DEC): 24.8 % — ABNORMAL LOW (ref 38.50–50.00)
BKR WAM HEMOGLOBIN: 8.5 g/dL — ABNORMAL LOW (ref 13.2–17.1)
BKR WAM IMMATURE GRANULOCYTES: 3.9 % — ABNORMAL HIGH (ref 0.0–1.0)
BKR WAM LYMPHOCYTES: 10.6 % — ABNORMAL LOW (ref 17.0–50.0)
BKR WAM MCH (PG): 30.1 pg (ref 27.0–33.0)
BKR WAM MCHC: 34.3 g/dL (ref 31.0–36.0)
BKR WAM MCV: 87.9 fL (ref 80.0–100.0)
BKR WAM MONOCYTE ABSOLUTE COUNT.: 1.3 x 1000/ÂµL — ABNORMAL HIGH (ref 0.00–1.00)
BKR WAM MONOCYTES: 41.9 % — ABNORMAL HIGH (ref 4.0–12.0)
BKR WAM MPV: 10.8 fL (ref 8.0–12.0)
BKR WAM NEUTROPHILS: 42.3 % (ref 39.0–72.0)
BKR WAM NUCLEATED RED BLOOD CELLS: 1.6 % — ABNORMAL HIGH (ref 0.0–1.0)
BKR WAM PLATELETS: 66 x1000/ÂµL — ABNORMAL LOW (ref 150–420)
BKR WAM RDW-CV: 18.1 % — ABNORMAL HIGH (ref 11.0–15.0)
BKR WAM RED BLOOD CELL COUNT.: 2.82 M/ÂµL — ABNORMAL LOW (ref 4.00–6.00)
BKR WAM WHITE BLOOD CELL COUNT: 3.1 x1000/ÂµL — ABNORMAL LOW (ref 4.0–11.0)

## 2022-10-16 LAB — RETICULOCYTES
BKR WAM IRF: 27.7 % — ABNORMAL HIGH (ref 3.0–15.9)
BKR WAM RETICULOCYTE - ABS (3 DEC): 0.139 10Ë6 cells/uL (ref 0.023–0.140)
BKR WAM RETICULOCYTE COUNT PCT (1 DEC): 4.9 % — ABNORMAL HIGH (ref 0.6–2.7)
BKR WAM RETICULOCYTE HGB EQUIVALENT: 38.9 pg — ABNORMAL HIGH (ref 28.2–35.7)

## 2022-10-16 LAB — MAGNESIUM: BKR MAGNESIUM: 1.4 mg/dL — ABNORMAL LOW (ref 1.7–2.4)

## 2022-10-16 LAB — IMMATURE PLATELET FRACTION (BH GH LMW YH)
BKR WAM IPF, ABSOLUTE: 6.1 x1000/ÂµL (ref <20.0)
BKR WAM IPF: 9.2 % — ABNORMAL HIGH (ref 1.2–8.6)

## 2022-10-16 LAB — PHOSPHORUS     (BH GH L LMW YH): BKR PHOSPHORUS: 2 mg/dL — ABNORMAL LOW (ref 2.2–4.5)

## 2022-10-16 LAB — TACROLIMUS LEVEL     (BH GH L LMW YH): BKR TACROLIMUS BLOOD: 7.5 ng/mL

## 2022-10-16 LAB — LACTATE DEHYDROGENASE: BKR LACTATE DEHYDROGENASE: 410 U/L — ABNORMAL HIGH (ref 122–241)

## 2022-10-16 MED ORDER — MAGNESIUM SULFATE 2 GRAM/50 ML (4 %) IN WATER INTRAVENOUS PIGGYBACK
2 | Freq: Once | INTRAVENOUS | Status: CP
Start: 2022-10-16 — End: ?
  Administered 2022-10-16: 15:00:00 2 mL/h via INTRAVENOUS

## 2022-10-16 MED ORDER — PENTAMIDINE FOR INHALATION SOLUTION
Freq: Once | RESPIRATORY_TRACT | Status: CP
Start: 2022-10-16 — End: ?
  Administered 2022-10-16: 15:00:00 6.000 mL via RESPIRATORY_TRACT

## 2022-10-16 MED ORDER — ALBUTEROL SULFATE HFA 90 MCG/ACTUATION AEROSOL INHALER
90 | Freq: Once | RESPIRATORY_TRACT | Status: CP
Start: 2022-10-16 — End: ?
  Administered 2022-10-16: 15:00:00 90 mcg/actuation via RESPIRATORY_TRACT

## 2022-10-16 NOTE — Progress Notes
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 Negative for chills and fever. HENT: Negative.  Negative for sore throat.  Eyes: Negative.  Respiratory: Negative.  Negative for cough, sputum production and shortness of breath.  Cardiovascular:  Negative for chest pain, palpitations and leg swelling. Gastrointestinal: Negative.  Negative for abdominal pain, constipation, diarrhea, nausea and vomiting.      Decreased oral intake, estimates 15%Has not been drinking ensure Genitourinary: Negative.  Negative for dysuria. Musculoskeletal: Negative.  Skin: Negative.  Negative for itching and rash. Neurological: Negative.  Negative for dizziness, tremors and headaches. Psychiatric/Behavioral: Negative.   VoriconazoleCurrent Outpatient Medications on File Prior to Visit Medication Sig Dispense Refill  acyclovir (ZOVIRAX) 400 mg tablet Take 1 tablet (400 mg total) by mouth 2 (two) times daily. 60 tablet 11  amLODIPine (NORVASC) 10 mg tablet Take 1 tablet (10 mg total) by mouth daily.    finasteride (PROSCAR) 5 mg tablet TAKE 1 TABLET BY MOUTH DAILY FOR ENLARGED PROSTATE WITH URINATION PROBLEM TAKE WITH OR WITHOUT MEALS    folic acid (FOLVITE) 1 mg tablet Take 1 tablet (1 mg total) by mouth daily. 30 tablet 11  isavuconazonium (CRESEMBA) 186 mg capsule Take 2 capsules (372 mg total) by mouth daily. 60 capsule 0  latanoprost (XALATAN) 0.005 % ophthalmic solution Place 1 drop into both eyes nightly.    magnesium oxide 400 mg magnesium Cap Take 400 mg by mouth 2 (two) times daily.    Miscellaneous Medical Supply Please provide rolling walker, Use as directed. 1 each 0  multivitamin tablet Take 1 tablet by mouth daily. 30 tablet 11  ondansetron (ZOFRAN) 8 mg tablet Take 1 tablet (8 mg total) by mouth every 8 (eight) hours as needed for nausea or vomiting. 20 tablet 2  pentamidine (PENTAM) 300 mg inhalation solution Inhale 6 mLs (300 mg total) into the lungs every 28 days. Last given 09/19/22    prochlorperazine (COMPAZINE) 10 mg tablet Take 1 tablet (10 mg total) by mouth every 6 (six) hours as needed for nausea. 30 tablet 3  sodium chloride 0.9 % flush Use 10 mLs by IV Push route daily. In both lumens 600 mL 11  tacrolimus (PROGRAF) 0.5 mg Immediate Release capsule Take 1 capsule (0.5 mg total) by mouth 2 (two) times daily. (Patient taking differently: Take 1 capsule (0.5 mg total) by mouth 2 (two) times daily. Total dose: 6.5mg  BID) 60 capsule 11  tacrolimus (PROGRAF) 1 mg immediate release capsule Take 1 capsule (1 mg total) by mouth 2 (two) times daily. (Patient taking differently: Take 1 capsule (1 mg total) by mouth 2 (two) times daily. Total dose: 6.5mg  BID) 60 capsule 11  tacrolimus (PROGRAF) 5 mg Immediate Release capsule Take 1 capsule (5 mg total) by mouth 2 (two) times daily. (Patient taking differently: Take 1 capsule (5 mg total) by mouth 2 (two) times daily. Total dose: 6.5mg  BID) 60 capsule 11  ursodioL (URSO FORTE) 500 mg tablet Take 1 tablet (500 mg total) by mouth 2 times daily (0900, 1700). 60 tablet 6  [DISCONTINUED] venetoclax (VENCLEXTA) 100 mg tablet Take 4 tablets (400 mg total) by mouth once daily. Take with food. Swallow whole, do not crush. 120 tablet 0 No current facility-administered medications on file prior to visit. BP: 130/78 (  10/16/2022  9:27 AM), Pulse: (!) 94 (10/16/2022  9:27 AM), Temp: 97.5 ?F (36.4 ?C) (10/16/2022  9:27 AM), Temp src: Oral (10/16/2022  9:27 AM), Resp: 19 (10/16/2022  9:27 AM), SpO2: 98 % (10/16/2022  9:27 AM), Pain Score: Zero (10/16/2022  9:27 AM), Weight: 100.9 kg (10/16/2022  9:26 AM), Weight (lbs): 222.44 (10/16/2022  9:26 AM)Wt Readings from Last 3 Encounters: 10/16/22 100.9 kg 10/13/22 101.3 kg 10/10/22 100.7 kg Physical ExamConstitutional:     Appearance: Normal appearance. He is not ill-appearing. HENT:    Head: Normocephalic.    Nose: Nose normal. No congestion.    Mouth/Throat:    Mouth: Mucous membranes are moist.    Pharynx: Oropharynx is clear. Eyes:    General: No scleral icterus.      Right eye: No discharge.       Left eye: No discharge. Cardiovascular:    Rate and Rhythm: Normal rate and regular rhythm.    Pulses: Normal pulses.    Heart sounds: Normal heart sounds. No murmur heard.Pulmonary:    Effort: Pulmonary effort is normal. No respiratory distress.    Breath sounds: Normal breath sounds. No wheezing or rales. Abdominal:    General: Abdomen is flat. Bowel sounds are normal. There is no distension.    Palpations: Abdomen is soft.    Tenderness: There is no abdominal tenderness. There is no guarding. Musculoskeletal:       General: No swelling.    Right lower leg: No edema.    Left lower leg: No edema. Skin:   General: Skin is warm.    Findings: No erythema or rash. Neurological:    Mental Status: He is alert and oriented to person, place, and time. Psychiatric:       Behavior: Behavior normal. Lab Results Component Value Date/Time  WBC 3.1 (L) 10/16/2022 09:34 AM  HGB 8.5 (L) 10/16/2022 09:34 AM  HCT 24.80 (L) 10/16/2022 09:34 AM  PLT 66 (L) 10/16/2022 09:34 AM  Lab Results Component Value Date/Time  NEUTROPHILS 42.3 10/16/2022 09:34 AM  Lab Results Component Value Date/Time  NA 139 10/16/2022 09:34 AM  K 3.7 10/16/2022 09:34 AM  CL 104 10/16/2022 09:34 AM  CO2 22 10/16/2022 09:34 AM  BUN 11 10/16/2022 09:34 AM  CREATININE 0.98 10/16/2022 09:34 AM GLU 147 (H) 10/16/2022 09:34 AM  ANIONGAP 13 10/16/2022 09:34 AM  Lab Results Component Value Date/Time  CALCIUM 8.9 10/16/2022 09:34 AM  MG 1.4 (L) 10/16/2022 09:34 AM  PHOS 2.0 (L) 10/16/2022 09:34 AM  Lab Results Component Value Date/Time  ALT 19 10/16/2022 09:34 AM  AST 28 10/16/2022 09:34 AM  ALKPHOS 98 10/16/2022 09:34 AM  BILITOT 0.4 10/16/2022 09:34 AM  BILIDIR 0.2 09/11/2022 12:31 PM  ALBUMIN 3.4 (L) 10/16/2022 09:34 AM  Lab Results Component Value Date/Time  PTT 26.2 10/13/2022 10:48 AM  LABPROT 12.5 (H) 10/13/2022 10:48 AM  INR 1.14 (H) 10/13/2022 10:48 AM  Results in Past 7 DaysResult Component Current Result LD 410 (H) (10/16/2022) No results found for: RAPAMYCINResults in Past 7 DaysResult Component Current Result Tacrolimus Lvl 7.5 (10/16/2022) ]Assessment:Douglas [name] is a 72 y.o. 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 He is s/p 2 cycles of chemotherapy with Decitabine + Venetoclax with reduction in blast count. 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 He is engrafted on day +13 and is discharged on 10/10/22. Plan:  Day + 22. No s/sx of GVHD. Tac therapeutic. No transfusion needed. 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 Current dose 6.5mg  BID.  Therapeutic goal of 6-11ng/ml.  Level 7.5ng/ml.  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 Vaccines : 12, 14 , 16 and 24 months s/p transplantHealth Maintenance Day +30 Day +100 Day +180 1 Year Heme/Immune       T cell subsets/IGG       Chimerism/Bone Marrow or PET/Imaging []  [] []  [] []  [] []  Pulmonary       PFT's  []  []  []  Cardiology       Echo       EKG   []  Only if cardiac risk factors  [] []  [] []  Endocrine       TSH/TFT's       Fasting Lipids       Glucose/A1c       Vit D/Calcium        Bone Density (women and those on steroids)Reproductive Endocrine        FSH/LH/Testosterone (both men & women)   []  [] [] [] [] [] [] []  Liver      Ferritin      MRI T2* quant (if ferritin abnormal)    [] []  Ophthalmology       Sicca/Cataracts   []  []  Dermatology/musculo-skeletal        GVHD (skin & ROM)        Cancer screening (Derm referral)            [] []  [] []  Dental       GVHD       Routine Care   []  []  Majhail, et al. (2012). Recommended Screening and Preventive Practices for Long-Term Survivors after                    Hematopoietic Cell Transplantation. Biology of Blood and Marrow Transplantation, Volume 18,                    Issue 3, March 2012, Pages 348-371.Conditioning/Transplant:-Melphalan 70 mg/m2 over 30 minutes on D-6, *Melphalan dosed reduced to 70 mg/m2 due to age-Fludarabine 40 mg/m2 over 30 minutes every 24hr for 4 doses from D-5 to D -2             -Stem cell infusion day 0 (09/24/22)RTC on 9/3/24Amy B Livana Yerian,  APRNOn the day of this patient's encounter, a total of >40 minutes was personally spent by me.  This does not include any resident/fellow teaching time, or any time spent performing a procedural service.

## 2022-10-16 NOTE — Progress Notes
Day + 22 allo/aml. [name] and his wife are staying at the [place]. VSS. See focused reassessment. He has little endurance and is getting PT at the hotel. aHIs appetite is about 15% normal, he doesn't like the suppliment choices. He thinks that his app might be improving a little though. He reports formed bowel movements BID for the last 2 days. He saw Amy Kathaleen Grinder today. He received 2 gm IV magnesium. Pentamidine administered by RT. He returns to [facility] on 9/[facility].

## 2022-10-17 ENCOUNTER — Ambulatory Visit: Admit: 2022-10-17 | Payer: MEDICARE

## 2022-10-20 ENCOUNTER — Ambulatory Visit: Admit: 2022-10-20 | Payer: MEDICARE

## 2022-10-20 ENCOUNTER — Inpatient Hospital Stay: Admit: 2022-10-20 | Discharge: 2022-10-20 | Payer: MEDICARE

## 2022-10-20 ENCOUNTER — Encounter: Admit: 2022-10-20 | Payer: PRIVATE HEALTH INSURANCE

## 2022-10-20 ENCOUNTER — Ambulatory Visit: Admit: 2022-10-20 | Payer: MEDICARE | Attending: Pharmacist Clinician (PhC)/ Clinical Pharmacy Specialist

## 2022-10-20 DIAGNOSIS — C931 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: Secondary | ICD-10-CM

## 2022-10-20 DIAGNOSIS — Z9481 Bone marrow transplant status: Secondary | ICD-10-CM

## 2022-10-20 DIAGNOSIS — I10 Essential (primary) hypertension: Secondary | ICD-10-CM

## 2022-10-20 DIAGNOSIS — Z79621 Long term (current) use of calcineurin inhibitor: Secondary | ICD-10-CM

## 2022-10-20 DIAGNOSIS — R638 Other symptoms and signs concerning food and fluid intake: Secondary | ICD-10-CM

## 2022-10-20 DIAGNOSIS — R0609 Other forms of dyspnea: Secondary | ICD-10-CM

## 2022-10-20 DIAGNOSIS — Z79624 Long term (current) use of inhibitors of nucleotide synthesis: Secondary | ICD-10-CM

## 2022-10-20 DIAGNOSIS — C9200 Acute myeloblastic leukemia, not having achieved remission: Secondary | ICD-10-CM

## 2022-10-20 DIAGNOSIS — Z79899 Other long term (current) drug therapy: Secondary | ICD-10-CM

## 2022-10-20 DIAGNOSIS — D709 Neutropenia, unspecified: Secondary | ICD-10-CM

## 2022-10-20 DIAGNOSIS — E782 Mixed hyperlipidemia: Secondary | ICD-10-CM

## 2022-10-20 DIAGNOSIS — F419 Anxiety disorder, unspecified: Secondary | ICD-10-CM

## 2022-10-20 DIAGNOSIS — C9110 Chronic lymphocytic leukemia of B-cell type not having achieved remission: Secondary | ICD-10-CM

## 2022-10-20 DIAGNOSIS — Z8249 Family history of ischemic heart disease and other diseases of the circulatory system: Secondary | ICD-10-CM

## 2022-10-20 DIAGNOSIS — Z9484 Stem cells transplant status: Secondary | ICD-10-CM

## 2022-10-20 DIAGNOSIS — Z5181 Encounter for therapeutic drug level monitoring: Secondary | ICD-10-CM

## 2022-10-20 DIAGNOSIS — C4491 Basal cell carcinoma of skin, unspecified: Secondary | ICD-10-CM

## 2022-10-20 DIAGNOSIS — D701 Agranulocytosis secondary to cancer chemotherapy: Secondary | ICD-10-CM

## 2022-10-20 DIAGNOSIS — R634 Abnormal weight loss: Secondary | ICD-10-CM

## 2022-10-20 DIAGNOSIS — N400 Enlarged prostate without lower urinary tract symptoms: Secondary | ICD-10-CM

## 2022-10-20 DIAGNOSIS — F32A Depression: Secondary | ICD-10-CM

## 2022-10-20 DIAGNOSIS — H409 Unspecified glaucoma: Secondary | ICD-10-CM

## 2022-10-20 DIAGNOSIS — I119 Hypertensive heart disease without heart failure: Secondary | ICD-10-CM

## 2022-10-20 DIAGNOSIS — R5381 Other malaise: Secondary | ICD-10-CM

## 2022-10-20 LAB — COMPREHENSIVE METABOLIC PANEL
BKR A/G RATIO: 1.2 (ref 1.0–2.2)
BKR ALANINE AMINOTRANSFERASE (ALT): 34 U/L (ref 9–59)
BKR ALBUMIN: 3.8 g/dL (ref 3.6–5.1)
BKR ALKALINE PHOSPHATASE: 106 U/L (ref 9–122)
BKR ANION GAP: 14 g/dL — ABNORMAL LOW (ref 7–17)
BKR ASPARTATE AMINOTRANSFERASE (AST): 40 U/L — ABNORMAL HIGH (ref 10–35)
BKR AST/ALT RATIO: 1.2
BKR BILIRUBIN TOTAL: 0.4 mg/dL (ref <=1.2)
BKR BLOOD UREA NITROGEN: 14 mg/dL (ref 8–23)
BKR BUN / CREAT RATIO: 13.5 % — ABNORMAL HIGH (ref 8.0–23.0)
BKR CALCIUM: 9.2 mg/dL (ref 8.8–10.2)
BKR CHLORIDE: 104 mmol/L — ABNORMAL LOW (ref 98–107)
BKR CO2: 20 mmol/L — ABNORMAL LOW (ref 20–30)
BKR CREATININE: 1.04 mg/dL (ref 0.40–1.30)
BKR EGFR, CREATININE (CKD-EPI 2021): >60 mL/min/{1.73_m2} (ref >=60)
BKR GLOBULIN: 3.2 g/dL (ref 2.0–3.9)
BKR GLUCOSE: 152 mg/dL — ABNORMAL HIGH (ref 70–100)
BKR POTASSIUM: 3.9 mmol/L (ref 3.3–5.3)
BKR PROTEIN TOTAL: 7 g/dL (ref 5.9–8.3)
BKR SODIUM: 138 mmol/L (ref 136–144)

## 2022-10-20 LAB — CBC WITH AUTO DIFFERENTIAL
BKR WAM ABSOLUTE IMMATURE GRANULOCYTES.: 0.03 x 1000/ÂµL (ref 0.00–0.30)
BKR WAM ABSOLUTE LYMPHOCYTE COUNT.: 0.47 x 1000/ÂµL — ABNORMAL LOW (ref 0.60–3.70)
BKR WAM ABSOLUTE NRBC (2 DEC): 0 x 1000/ÂµL (ref 0.00–1.00)
BKR WAM ANC (ABSOLUTE NEUTROPHIL COUNT): 0.94 x 1000/ÂµL — ABNORMAL LOW (ref 2.00–7.60)
BKR WAM BASOPHIL ABSOLUTE COUNT.: 0.02 x 1000/ÂµL (ref 0.00–1.00)
BKR WAM BASOPHILS: 0.8 % (ref 0.0–1.4)
BKR WAM EOSINOPHIL ABSOLUTE COUNT.: 0 x 1000/ÂµL (ref 0.00–1.00)
BKR WAM EOSINOPHILS: 0 % — ABNORMAL HIGH (ref 0.0–5.0)
BKR WAM HEMATOCRIT (2 DEC): 26.6 % — ABNORMAL LOW (ref 38.50–50.00)
BKR WAM HEMOGLOBIN: 8.8 g/dL — ABNORMAL LOW (ref 13.2–17.1)
BKR WAM IMMATURE GRANULOCYTES: 1.2 % — ABNORMAL HIGH (ref 0.0–1.0)
BKR WAM LYMPHOCYTES: 18.1 % (ref 17.0–50.0)
BKR WAM MCH (PG): 29.7 pg (ref 27.0–33.0)
BKR WAM MCHC: 33.1 g/dL (ref 31.0–36.0)
BKR WAM MCV: 89.9 fL (ref 80.0–100.0)
BKR WAM MONOCYTE ABSOLUTE COUNT.: 1.14 x 1000/ÂµL — ABNORMAL HIGH (ref 0.00–1.00)
BKR WAM MONOCYTES: 43.8 % — ABNORMAL HIGH (ref 4.0–12.0)
BKR WAM MPV: 11.1 fL (ref 8.0–12.0)
BKR WAM NEUTROPHILS: 36.1 % — ABNORMAL LOW (ref 39.0–72.0)
BKR WAM NUCLEATED RED BLOOD CELLS: 0 % (ref 0.0–1.0)
BKR WAM PLATELETS: 104 x1000/ÂµL — ABNORMAL LOW (ref 150–420)
BKR WAM RDW-CV: 20.7 % — ABNORMAL HIGH (ref 11.0–15.0)
BKR WAM RED BLOOD CELL COUNT.: 2.96 M/ÂµL — ABNORMAL LOW (ref 4.00–6.00)
BKR WAM WHITE BLOOD CELL COUNT: 2.6 x1000/ÂµL — ABNORMAL LOW (ref 4.0–11.0)

## 2022-10-20 LAB — RETICULOCYTES
BKR WAM IRF: 24.6 % — ABNORMAL HIGH (ref 3.0–15.9)
BKR WAM RETICULOCYTE - ABS (3 DEC): 0.145 10Ë6 cells/uL — ABNORMAL HIGH (ref 0.023–0.140)
BKR WAM RETICULOCYTE COUNT PCT (1 DEC): 4.9 % — ABNORMAL HIGH (ref 0.6–2.7)
BKR WAM RETICULOCYTE HGB EQUIVALENT: 36.1 pg — ABNORMAL HIGH (ref 28.2–35.7)

## 2022-10-20 LAB — PT/INR AND PTT (BH GH L LMW YH)
BKR INR: 1.15 — ABNORMAL HIGH (ref 0.86–1.12)
BKR PARTIAL THROMBOPLASTIN TIME: 27.1 s (ref 23.0–31.4)
BKR PROTHROMBIN TIME: 12.7 seconds — ABNORMAL HIGH (ref 9.6–12.3)

## 2022-10-20 LAB — TACROLIMUS LEVEL     (BH GH L LMW YH): BKR TACROLIMUS BLOOD: 8.2 ng/mL

## 2022-10-20 LAB — PHOSPHORUS     (BH GH L LMW YH): BKR PHOSPHORUS: 2.3 mg/dL (ref 2.2–4.5)

## 2022-10-20 LAB — LACTATE DEHYDROGENASE: BKR LACTATE DEHYDROGENASE: 392 U/L — ABNORMAL HIGH (ref 122–241)

## 2022-10-20 LAB — HAPTOGLOBIN: BKR HAPTOGLOBIN: 174 mg/dL (ref 30–200)

## 2022-10-20 LAB — MAGNESIUM: BKR MAGNESIUM: 1.4 mg/dL — ABNORMAL LOW (ref 1.7–2.4)

## 2022-10-20 LAB — TRIGLYCERIDES: BKR TRIGLYCERIDES: 161 mg/dL — ABNORMAL HIGH (ref 23.0–31.4)

## 2022-10-20 MED ORDER — FILGRASTIM-SNDZ 480 MCG/0.8 ML INJECTION SYRINGE
480 | Freq: Once | SUBCUTANEOUS | Status: CP
Start: 2022-10-20 — End: ?
  Administered 2022-10-20: 15:00:00 480 mL via SUBCUTANEOUS

## 2022-10-20 MED ORDER — MAGNESIUM SULFATE 2 GRAM/50 ML (4 %) IN WATER INTRAVENOUS PIGGYBACK
2 | Freq: Once | INTRAVENOUS | Status: CP
Start: 2022-10-20 — End: ?
  Administered 2022-10-20: 14:00:00 2 mL/h via INTRAVENOUS

## 2022-10-20 NOTE — Other
Post-HSCT Follow-Up Pharmacist note Diagnosis: CMMLConditioning:   Fludarabine/Melphalan with post-transplant Cyclophosphamide Transplant Type:10/10 MUD Allogeneic PBSCTTransplant Date (Day 0): 09/24/22 Referring Provider: Chrystine Oiler, MDEncounter Date: 10/20/2022 = D+26[name] is a 72 y.o. year-old male with history of CMML s/p HSCT. Patient is being seen by the clinic pharmacist for medication management included post stem cell transplant, hematological related disorders and GI related disorders .  This visit was completed as a joint-visit with provider present and is aware of the care plan.Subjective: Having loose stools intermittent, has been using loperamide.  Took one dose todayUtilizing zofran and compazine as needed for nausea Assessment and Recommendations:  Summary of Medication Changes and Follow-up Items Discussed with Provider:Tacrolimus 8.2 -  continue current dosePreviously on aspirin, per Amy no history of cardiac risk will not restart in futureANC 940, given filgrastim x 1GVHD (immunosuppression)Tacrolimus: Current dose: 6.5 mg PO twice daily09/03/24  level: 8.2 -  continue current doseGoal range: 6-11 ng/mLDosage dispensed: 0.5mg , 1mg , 5mg  capsulesPharmacy dispensed: OPSPost transplant cyclophosphamide: completed inpatient *All immunosuppression is managed by the bone marrow transplant team.Infectious Disease (prophylaxis, monitoring, and active infections)ViralCurrent prophylaxis/treatment: Acyclovir 400mg  PO twice dailyPrevious viral infections: 	HSV/VZV +/?CMV -/?, 8/27 CMV negativeEBV IgG negativeBK virus N/APneumocystis jeuvecii pneumoniaCurrent prophylaxis/treatment: Pentamidine 300mg  inhaled monthly last given 8/30/24Previous PJP infection: N/AT-cell subsetDay +100 (date):Day +180 (date):1 year (date):Toxoplasmosis IgG negativeCurrent prophylaxis/treatment: NoneFungal/MoldCurrent prophylaxis/treatment: Isavuconazole 372mg  PO daily - history of prolonged cytopenias Previous fungal/mold infection: N/A, Hx of gingival changes to voriconazole Drug monitoring: N/AGalactomann - 10/01/22 negFungitell - 10/01/22 < 31Bacterial	Current prophylaxis/treatment: None	Previous bacterial infection: N/A	Drug monitoring: N/A	Blood cultures: N/ASupportive careHyperlipidemia monitoring (triglycerides, Cholesterol, LDL, HDL)Current treatment: None	Prior treatment: Atorvastatin 40mg  PO daily, held with transplantBaseline (09/18/2022): 68 Today (10/20/22): 161Hypertension monitoring (blood pressure)Current treatment: Amlodipine 10mg  PO dailyPrior treatment: Metoprolol and lisinopril on hold since transplant Today (10/20/22):  124/75GastrointestionalStress ulcer prophylaxis: None, one episode of acid refluxNausea/Vomiting: Intermittent nausea, takes ondansetron with good effect but prefers prochlorperazine, Diarrhea/constipation: Continues to have soft stools, loperamidine prnOral care and mucositis: NoneVeno-occlusive diseaseCurrent treatment:  Ursodiol 500mg  PO twice daily until day +90Anticoagulation	Current prophylaxis/treatment: None	Previous anticoagulation history: Aspirin 81mg  PO daily, for cardiac ppx - not restartingElectrolyte supplementation:	Current treatment: Magnesium oxide 400mg  BID	Monitoring: Mg 1.4 09/03/24Hypogammaglobulinemia	Current treatment:	Goal: 500-800 ng/dLIVIG last dose:Baseline IgG level:Last IgG level:IgG scheduled levelsDay +100 (date):Day +180 (date):	1 year (date):	Maintenance Therapy Plan: VaccinationsBaseline titers: 	Hepatitis B: 	Pneumococcal: 	VZV 	Measles: , Mumps: , Rubella: 12 month: 14 month: 18 month:	Hep B titer	Pneumococcal titers24 month:Titers drawn 2 months after 24 months:Redose needed:Medication History:Douglas [name] presents to today's visit with their med action plan.  A medication history was obtained from the patient and spouse.Adherence:The patient reports 0 missed doses of medications in the past 1 week(s). The patient does not report the barriers to adherence. Discussed potential outcomes associated with non-adherenceAffordability:The patient denies concerns with affordability of medications at this time. Medication Management:Plan for medication management assistance: An updated medication action plan was given to the patient, no changesPatient would benefit from continued pharmacy follow-up at future clinic visits for medication managementEducated patient on the above changesPatient did not request refills todayObjective: JXB:JYNWGN Results (from the past 168 hour(s)) CBC auto differential  Collection Time: 10/20/22 10:17 AM Result Value Ref Range  WBC 2.6 (L) 4.0 - 11.0 x1000/?L  RBC 2.96 (L) 4.00 - 6.00 M/?L  Hemoglobin 8.8 (L) 13.2 - 17.1 g/dL  Hematocrit 56.21 (L) 30.86 - 50.00 %  MCV 89.9 80.0 - 100.0 fL  MCH 29.7 27.0 - 33.0 pg  MCHC 33.1 31.0 - 36.0 g/dL  RDW-CV  20.7 (H) 11.0 - 15.0 %  Platelets 104 (L) 150 - 420 x1000/?L  MPV 11.1 8.0 - 12.0 fL  Neutrophils 36.1 (L) 39.0 - 72.0 %  Lymphocytes 18.1 17.0 - 50.0 %  Monocytes 43.8 (H) 4.0 - 12.0 %  Eosinophils 0.0 0.0 - 5.0 %  Basophil 0.8 0.0 - 1.4 %  Immature Granulocytes 1.2 (H) 0.0 - 1.0 %  nRBC 0.0 0.0 - 1.0 %  Absolute Lymphocyte Count 0.47 (L) 0.60 - 3.70 x 1000/?L  Monocyte Absolute Count 1.14 (H) 0.00 - 1.00 x 1000/?L  Eosinophil Absolute Count 0.00 0.00 - 1.00 x 1000/?L  Basophil Absolute Count 0.02 0.00 - 1.00 x 1000/?L  Absolute Immature Granulocyte Count 0.03 0.00 - 0.30 x 1000/?L  Absolute nRBC 0.00 0.00 - 1.00 x 1000/?L  ANC (Abs Neutrophil Count) 0.94 (L) 2.00 - 7.60 x 1000/?L [id number] Results (from the past 168 hour(s)) Comprehensive metabolic panel  Collection Time: 10/20/22 10:17 AM Result Value Ref Range  Sodium 138 136 - 144 mmol/L  Potassium 3.9 3.3 - 5.3 mmol/L  Chloride 104 98 - 107 mmol/L  CO2 20 20 - 30 mmol/L  Anion Gap 14 7 - 17  Glucose 152 (H) 70 - 100 mg/dL  BUN 14 8 - 23 mg/dL  Creatinine 4.09 8.11 - 1.30 mg/dL  Calcium 9.2 8.8 - 91.4 mg/dL  BUN/Creatinine Ratio 78.2 8.0 - 23.0  Total Protein 7.0 5.9 - 8.3 g/dL  Albumin 3.8 3.6 - 5.1 g/dL  Total Bilirubin 0.4 <=9.5 mg/dL  Alkaline Phosphatase 621 9 - 122 U/L  Alanine Aminotransferase (ALT) 34 9 - 59 U/L  Aspartate Aminotransferase (AST) 40 (H) 10 - 35 U/L  Globulin 3.2 2.0 - 3.9 g/dL  A/G Ratio 1.2 1.0 - 2.2  AST/ALT Ratio 1.2 Reference Range Not Established  eGFR (Creatinine) >60 >=60 mL/min/1.23m2 LDH: Recent Results (from the past 168 hour(s)) Lactate dehydrogenase  Collection Time: 10/20/22 10:17 AM Result Value Ref Range  LD 392 (H) 122 - 241 U/L Patient Education: The following education was provided for: Initiation, modification, or discontinuation of drug therapy, Assessment of efficacy and safety of drug therapy, and Adverse effect presentation, evaluation, and monitoring during today?s visit to the patient and spouse regarding medication management: purpose of each medication, side effects of medications, and importance of adherence with the regimen(s) Time Spent: 15 minutes, face-to-face consultPATIENT AND/OR CAREGIVER VERBALIZED UNDERSTANDING OF CARE PLAN: yesPATIENT ADVISED TO CALL BACK WITH QUESTIONS, CONCERNS, OR CHANGE IN SYMPTOMS.Signed by: Jorene Guest, PharmD BCPS BCOP09/03/24 11:19 AM

## 2022-10-20 NOTE — Progress Notes
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 He is doing well. Working with PT, now only utliziing a cane to ambulate. No chest pain, heart palpitations,  or shortness of breath. Occasional cough, does not feel sick. Occasional nausea, no vomiting. Eating more, but not at baseline. Losing weight. No diarrhea or constipation - however, had one loose stool this morning, took an imodium. Voiding without difficulty. No rash, fever or chills. Review of Systems Constitutional:  Positive for malaise/fatigue and weight loss. Negative for chills and fever. HENT: Negative.  Negative for sore throat.  Eyes: Negative.  Respiratory: Negative.  Negative for cough, sputum production and shortness of breath.  Cardiovascular:  Negative for chest pain, palpitations and leg swelling. Gastrointestinal: Negative.  Negative for abdominal pain, constipation, diarrhea, nausea and vomiting.      Eating better Genitourinary: Negative.  Negative for dysuria. Musculoskeletal: Negative.       Working with PT - ambulating now with cane instead of walker Skin: Negative.  Negative for itching and rash. Neurological: Negative.  Negative for dizziness, tremors and headaches. Psychiatric/Behavioral: Negative.   VoriconazoleCurrent Outpatient Medications on File Prior to Visit Medication Sig Dispense Refill  acyclovir (ZOVIRAX) 400 mg tablet Take 1 tablet (400 mg total) by mouth 2 (two) times daily. 60 tablet 11  amLODIPine (NORVASC) 10 mg tablet Take 1 tablet (10 mg total) by mouth daily.    finasteride (PROSCAR) 5 mg tablet TAKE 1 TABLET BY MOUTH DAILY FOR ENLARGED PROSTATE WITH URINATION PROBLEM TAKE WITH OR WITHOUT MEALS    folic acid (FOLVITE) 1 mg tablet Take 1 tablet (1 mg total) by mouth daily. 30 tablet 11  isavuconazonium (CRESEMBA) 186 mg capsule Take 2 capsules (372 mg total) by mouth daily. 60 capsule 0  latanoprost (XALATAN) 0.005 % ophthalmic solution Place 1 drop into both eyes nightly.    magnesium oxide 400 mg magnesium Cap Take 400 mg by mouth daily.    Miscellaneous Medical Supply Please provide rolling walker, Use as directed. 1 each 0  multivitamin tablet Take 1 tablet by mouth daily. 30 tablet 11  ondansetron (ZOFRAN) 8 mg tablet Take 1 tablet (8 mg total) by mouth every 8 (eight) hours as needed for nausea or vomiting. 20 tablet 2  pentamidine (PENTAM) 300 mg inhalation solution Inhale 6 mLs (300 mg total) into the lungs every 28 days. Last given 10/16/22    prochlorperazine (COMPAZINE) 10 mg tablet Take 1 tablet (10 mg total) by mouth every 6 (six) hours as needed for nausea. 30 tablet 3  sodium chloride 0.9 % flush Use 10 mLs by IV Push route daily. In both lumens 600 mL 11  tacrolimus (PROGRAF) 0.5 mg Immediate Release capsule Take 1 capsule (0.5 mg total) by mouth 2 (two) times daily. (Patient taking differently: Take 1 capsule (0.5 mg total) by mouth 2 (two) times daily. Total dose: 6.5  mg BID) 60 capsule 11  tacrolimus (PROGRAF) 1 mg immediate release capsule Take 1 capsule (1 mg total) by mouth 2 (two) times daily. (Patient taking differently: Take 1 capsule (1 mg total) by mouth 2 (two) times daily. Total dose: 6.5mg  BID) 60 capsule 11  tacrolimus (PROGRAF) 5 mg Immediate Release capsule Take 1 capsule (5 mg total) by mouth 2 (  two) times daily. (Patient taking differently: Take 1 capsule (5 mg total) by mouth 2 (two) times daily. Total dose: 6.5mg  BID) 60 capsule 11  ursodioL (URSO FORTE) 500 mg tablet Take 1 tablet (500 mg total) by mouth 2 times daily (0900, 1700). 60 tablet 6  [DISCONTINUED] venetoclax (VENCLEXTA) 100 mg tablet Take 4 tablets (400 mg total) by mouth once daily. Take with food. Swallow whole, do not crush. 120 tablet 0 Current Facility-Administered Medications on File Prior to Visit Medication Dose Route Frequency Provider Last Rate Last Admin  [COMPLETED] filgrastim-sndz (ZARXIO) syringe 480 mcg  5 mcg/kg Subcutaneous Once Vernelle Emerald, MD   480 mcg at 10/20/22 1116  [COMPLETED] magnesium sulfate in water 2 gram/50 mL (4 %) (IVPB) 2 g  2 g Intravenous Once [name], APRN 50 mL/hr at 10/20/22 1016 2 g at 10/20/22 1016 BP: 124/75 (10/20/2022 10:41 AM), Pulse: 76 (10/20/2022 10:41 AM), Temp: 97.9 ?F (36.6 ?C) (10/20/2022 10:41 AM), Resp: 16 (10/20/2022 10:41 AM), SpO2: 100 % (10/20/2022 10:41 AM), Pain Score: Zero (10/20/2022 10:26 AM), Weight: 99.7 kg (10/20/2022 10:26 AM), Weight (lbs): 219.8 (10/20/2022 10:26 AM)Wt Readings from Last 3 Encounters: 10/20/22 99.7 kg 10/16/22 100.9 kg 10/13/22 101.3 kg Physical ExamConstitutional:     Appearance: Normal appearance. He is not ill-appearing. HENT:    Head: Normocephalic.    Nose: Nose normal. No congestion.    Mouth/Throat:    Mouth: Mucous membranes are moist.    Pharynx: Oropharynx is clear. Eyes:    General: No scleral icterus.      Right eye: No discharge.       Left eye: No discharge. Cardiovascular:    Rate and Rhythm: Normal rate and regular rhythm.    Pulses: Normal pulses.    Heart sounds: Normal heart sounds. No murmur heard.Pulmonary:    Effort: Pulmonary effort is normal. No respiratory distress.    Breath sounds: Normal breath sounds. No wheezing or rales. Abdominal:    General: Abdomen is flat. Bowel sounds are normal. There is no distension.    Palpations: Abdomen is soft.    Tenderness: There is no abdominal tenderness. There is no guarding. Musculoskeletal:       General: No swelling.    Right lower leg: No edema.    Left lower leg: No edema. Skin:   General: Skin is warm.    Findings: No erythema or rash. Neurological:    Mental Status: He is alert and oriented to person, place, and time. Psychiatric:       Behavior: Behavior normal. Lab Results Component Value Date/Time  WBC 2.6 (L) 10/20/2022 10:17 AM  HGB 8.8 (L) 10/20/2022 10:17 AM  HCT 26.60 (L) 10/20/2022 10:17 AM  PLT 104 (L) 10/20/2022 10:17 AM  Lab Results Component Value Date/Time  NEUTROPHILS 36.1 (L) 10/20/2022 10:17 AM  Lab Results Component Value Date/Time  NA 138 10/20/2022 10:17 AM  K 3.9 10/20/2022 10:17 AM  CL 104 10/20/2022 10:17 AM  CO2 20 10/20/2022 10:17 AM  BUN 14 10/20/2022 10:17 AM  CREATININE 1.04 10/20/2022 10:17 AM  GLU 152 (H) 10/20/2022 10:17 AM  ANIONGAP 14 10/20/2022 10:17 AM  Lab Results Component Value Date/Time  CALCIUM 9.2 10/20/2022 10:17 AM  MG 1.4 (L) 10/20/2022 10:17 AM  PHOS 2.3 10/20/2022 10:17 AM  Lab Results Component Value Date/Time  ALT 34 10/20/2022 10:17 AM  AST 40 (H) 10/20/2022 10:17 AM  ALKPHOS 106 10/20/2022 10:17 AM  BILITOT 0.4 10/20/2022 10:17 AM  BILIDIR 0.2 09/11/2022 12:31 PM  ALBUMIN 3.8 10/20/2022 10:17 AM  Lab Results Component Value Date/Time  PTT 27.1 10/20/2022 10:17 AM  LABPROT 12.7 (H) 10/20/2022 10:17 AM  INR 1.15 (H) 10/20/2022 10:17 AM  Results in Past 7 DaysResult Component Current Result LD 392 (H) (10/20/2022) No results found for: RAPAMYCINResults in Past 7 DaysResult Component Current Result Tacrolimus Lvl 7.5 (10/16/2022) ]Assessment:Olaoluwa W Huth is a 72 y.o. 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 He is s/p 2 cycles of chemotherapy with Decitabine + Venetoclax with reduction in blast count. 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 He is engrafted on day +13 and is discharged on 10/10/22. Plan: Day + 26. No s/sx of GVHD. Tac therapeutic. No transfusion needed, however, neutropenic and given dose of filgrastim.  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 Neutropenic, filgrastim givenTransfuse to maintain Hgb > 7 and plts >/=20 or if clinically indicatedInfectious Disease: Prophylactic Antimicrobials of Acyclovir, Cresemba and  Pentam Inhalation given last on 10/16/22 and will be repeated every 3-4 weeks Identified as CMV Risk: check periodically - not detected on 8/27/24GVHD Prophylaxis: No s/sxPost Transplant Cy 25mg /kg IV daily on day 3 and 4 (dose reduced by 50% ) Tacrolimus started on day +5. Current dose 6.5mg  BID.  Therapeutic goal of 6-11ng/ml.  Level 8.2 ng/ml.  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 Vaccines : 12, 14 , 16 and 24 months s/p transplantHealth Maintenance Day +30 Day +100 Day +180 1 Year Heme/Immune       T cell subsets/IGG       Chimerism/Bone Marrow or PET/Imaging []  [] []  [] []  [] []  Pulmonary       PFT's  []  []  []  Cardiology       Echo       EKG   []  Only if cardiac risk factors  [] []  [] []  Endocrine       TSH/TFT's       Fasting Lipids       Glucose/A1c       Vit D/Calcium        Bone Density (women and those on steroids)Reproductive Endocrine        FSH/LH/Testosterone (both men & women)   []  [] [] [] [] [] [] []  Liver      Ferritin      MRI T2* quant (if ferritin abnormal)    [] []  Ophthalmology       Sicca/Cataracts   []  []  Dermatology/musculo-skeletal        GVHD (skin & ROM)        Cancer screening (Derm referral)            [] []  [] []   Dental       GVHD       Routine Care   []  []              Majhail, et al. (2012). Recommended Screening and Preventive Practices for Long-Term Survivors after                    Hematopoietic Cell Transplantation. Biology of Blood and Marrow Transplantation, Volume 18,                    Issue 3, March 2012, Pages 348-371.Conditioning/Transplant:-Melphalan 70 mg/m2 over 30 minutes on D-6, *Melphalan dosed reduced to 70 mg/m2 due to age-Fludarabine 40 mg/m2 over 30 minutes every 24hr for 4 doses from D-5 to D -2             -Stem cell infusion day 0 (09/24/22)RTC on 9/3/24Amy B Reyes Aldaco, APRNOn the day of this patient's encounter, a total of >40 minutes was personally spent by me.  This does not include any resident/fellow teaching time, or any time spent performing a procedural service.

## 2022-10-20 NOTE — Progress Notes
Day + 26 post MUD for his AML.Reports improving energy and appetite.No fever/chills; no nausea/vomiting/diarrhea.Skin intact; no rash/edema noted.No SOB; no cough nor nasal congestion.RDLH dressing and claves changed.Labs drawn-given 1 run of magnesium 2 grams IV.Zarxio administered SQ to his right upper arm.Neutropenic precautions reinforced.Seen by Amy-no changes on medication dosing at this time.RTC on Friday.

## 2022-10-21 ENCOUNTER — Encounter: Admit: 2022-10-21 | Payer: PRIVATE HEALTH INSURANCE

## 2022-10-21 ENCOUNTER — Ambulatory Visit: Admit: 2022-10-21 | Payer: PRIVATE HEALTH INSURANCE

## 2022-10-21 LAB — CYTOMEGALOVIRUS QUANTITATIVE PCR, PLASMA     (BH GH L LMW YH): BKR CMV QUANTITATIVE PCR: NOT DETECTED

## 2022-10-21 NOTE — Progress Notes
Oncology Nutrition AssessmentLocation of visit: TelephoneType of visit: follow upDiagnosis/Treatment: AML (s/p SCT)Past Medical History: reviewed available dataNutritionally relevant medications/supplements: compazine, zofran, folic acid, magnesium oxide, tacrolimusNutritionally relevant labs: magnesium 1.4 mg/dL (low)   BMI: Estimated body mass index is 30.66 kg/m? as calculated from the following:  Height as of 09/09/22: 5' 11 (1.803 m).  Weight as of 10/20/22: 99.7 kg.   IBW: 86 kg ([place] + 10%)Weight changes and assessment:Wt: 10/20/22 99.7 kg 10/16/22 100.9 kg 10/13/22 101.3 kg 10/10/22 100.7 kg 09/16/22 104.2 kg 09/13/22 104.3 kg 07/22/22	114.0 kgWeight loss of 12.5% in three months, significant for timeframeNutrition Focused Physical Findings: unable to assess (telephone visit)Nutritional Needs: 2580 calories (30kcal/kg), 103-129 gm pro (1.2-1.5g protein/kg) based on AML with treatment, IBW   Diet history or considerationsFood Allergies/intolerances or aversions: NKFACultural/lifestyle/family needs: [facility] staying at [facility] nearbySupport system: wifeActivity level and/or physical limitations: not discussedNutrition assessment:Chart reviewed for follow up assessment. Patient with AML, s/p SCT. He has been experiencing nausea, low appetite, and altered taste. He has been trying to pick on foods throughout the day, but portions are small. He gave examples of food he has been eating recently, such as pancakes, pretzels, oatmeal, grinder, berries, apple, and a milkshake. He is eating partial portions of these foods, for example half a bowl of the oatmeal. He dislikes traditional Ensure products. Suspect patient is meeting less than 75% of estimated nutrition needs.  Nutrition Diagnosis: Malnutrition related to altered taste and nausea as evidenced by PO intake less than 75% for more than one month and significant weight loss of more than 5% in one month. - continuesNutrition Intervention: General/healthful diet   and Specific foods/beverages or groups Encouraged small, frequent meals and snacksPlan to provide sample of Orgain powder when patient is on site later this weekGoals by next nutrition visit:Consume 5-6 small meals and snacks daily - met, continueConsume one nutrition shake daily (Ensure Plus or similar product) - discontinue, trial Orgain powderEducation Materials provided: none (plan to provide later this week)Nutrition monitoring and evaluation: Oral fluids , Liquid meal replacement or supplement , Amount of food, Weight , and Digestive system  Coordination with providers: noneLearning assessment barriers: none Comprehension level: good based on discussion and questionsExpected compliance/motivation: good Follow up: plan to meet with patient when he is onsite later this weekLength of time spent with pt: 6 minutesSeen ZO:XWRUEA Stormy Connon, RD, CDN, MSElectronically Signed by Magda Paganini, RD, October 21, 2022

## 2022-10-21 NOTE — Progress Notes
SOCIAL WORK NOTEPatient Name: Douglas Karlovich BennettMedical Record Number: [id number] Date of Birth: [birth year]Medical Social Work Follow Up  [employer] Most Recent Value Admission Information  Document Type Progress Note Reason for Current Social Work Proofreader Comment wife, Okey Regal Record Reviewed Yes Level of Care Ambulatory What medium(s) of communication were used with patient/family/caregiver? Telephone Psychosocial issues requiring intervention hotel Psychosocial interventions 10 minutes by phone with Mrs. Malnar (who handles the finances) discussing the upcoming week for [facility] (9/7- 9/13) and she said there have been no changes in their finances. [facility] hematology Specific referrals to enhance community supports (include existing and new resources) [employer] Required? No Next Steps/Plan (including hand-off): Social worker will talk with him about the same issue again next week. Signature: Huntley Estelle, LCSW Contact Information: [phone]

## 2022-10-23 ENCOUNTER — Ambulatory Visit: Admit: 2022-10-23 | Payer: PRIVATE HEALTH INSURANCE | Attending: Medical Oncology

## 2022-10-23 ENCOUNTER — Inpatient Hospital Stay: Admit: 2022-10-23 | Discharge: 2022-10-23 | Payer: MEDICARE

## 2022-10-23 ENCOUNTER — Ambulatory Visit: Admit: 2022-10-23 | Payer: PRIVATE HEALTH INSURANCE

## 2022-10-23 DIAGNOSIS — Z9484 Stem cells transplant status: Secondary | ICD-10-CM

## 2022-10-23 DIAGNOSIS — C9200 Acute myeloblastic leukemia, not having achieved remission: Secondary | ICD-10-CM

## 2022-10-23 LAB — COMPREHENSIVE METABOLIC PANEL
BKR A/G RATIO: 1.3 (ref 1.0–2.2)
BKR ALANINE AMINOTRANSFERASE (ALT): 42 U/L (ref 9–59)
BKR ALBUMIN: 3.7 g/dL (ref 3.6–5.1)
BKR ALKALINE PHOSPHATASE: 113 U/L (ref 9–122)
BKR ANION GAP: 12 (ref 7–17)
BKR ASPARTATE AMINOTRANSFERASE (AST): 39 U/L — ABNORMAL HIGH (ref 10–35)
BKR AST/ALT RATIO: 0.9
BKR BILIRUBIN TOTAL: 0.4 mg/dL (ref <=1.2)
BKR BLOOD UREA NITROGEN: 11 mg/dL (ref 8–23)
BKR BUN / CREAT RATIO: 9.8 % — ABNORMAL HIGH (ref 8.0–23.0)
BKR CALCIUM: 9 mg/dL (ref 8.8–10.2)
BKR CHLORIDE: 104 mmol/L (ref 98–107)
BKR CO2: 22 mmol/L (ref 20–30)
BKR CREATININE: 1.12 mg/dL (ref 0.40–1.30)
BKR EGFR, CREATININE (CKD-EPI 2021): >60 mL/min/{1.73_m2} (ref >=60)
BKR GLOBULIN: 2.9 g/dL (ref 2.0–3.9)
BKR GLUCOSE: 139 mg/dL — ABNORMAL HIGH (ref 70–100)
BKR POTASSIUM: 3.8 mmol/L (ref 3.3–5.3)
BKR PROTEIN TOTAL: 6.6 g/dL (ref 5.9–8.3)
BKR SODIUM: 138 mmol/L (ref 136–144)

## 2022-10-23 LAB — CBC WITH AUTO DIFFERENTIAL
BKR WAM ABSOLUTE NRBC (2 DEC): 0.02 x 1000/ÂµL (ref 0.00–1.00)
BKR WAM HEMATOCRIT (2 DEC): 27.3 % — ABNORMAL LOW (ref 38.50–50.00)
BKR WAM HEMOGLOBIN: 8.9 g/dL — ABNORMAL LOW (ref 13.2–17.1)
BKR WAM MCH (PG): 30 pg (ref 27.0–33.0)
BKR WAM MCHC: 32.6 g/dL (ref 31.0–36.0)
BKR WAM MCV: 91.9 fL (ref 80.0–100.0)
BKR WAM MPV: 11.1 fL (ref 8.0–12.0)
BKR WAM NUCLEATED RED BLOOD CELLS: 0.2 % (ref 0.0–1.0)
BKR WAM PLATELETS: 141 x1000/ÂµL — ABNORMAL LOW (ref 150–420)
BKR WAM RDW-CV: 22.3 % — ABNORMAL HIGH (ref 11.0–15.0)
BKR WAM RED BLOOD CELL COUNT.: 2.97 M/ÂµL — ABNORMAL LOW (ref 4.00–6.00)
BKR WAM WHITE BLOOD CELL COUNT: 10.6 x1000/ÂµL (ref 4.0–11.0)

## 2022-10-23 LAB — MANUAL DIFFERENTIAL
BKR WAM BANDS (DIFF) (1 DEC): 2.6 % — ABNORMAL LOW (ref 0.0–10.0)
BKR WAM BASOPHIL - ABS (DIFF) 2 DEC: 0.18 x 1000/ÂµL (ref 0.00–1.00)
BKR WAM BASOPHILS (DIFF): 1.7 % — ABNORMAL HIGH (ref 0.0–1.4)
BKR WAM EOSINOPHILS (DIFF) 2 DEC: 0.09 x 1000/ÂµL (ref 0.00–1.00)
BKR WAM EOSINOPHILS (DIFF): 0.8 % (ref 0.0–5.0)
BKR WAM LYMPHOCYTE - ABS (DIFF) 2 DEC: 0.54 x 1000/ÂµL — ABNORMAL LOW (ref 0.60–3.70)
BKR WAM LYMPHOCYTES (DIFF): 5.1 % — ABNORMAL LOW (ref 17.0–50.0)
BKR WAM METAMYELOCYTES (DIFF) 1 DEC: 1.7 % — ABNORMAL HIGH (ref 0.0–1.0)
BKR WAM MONOCYTE - ABS (DIFF) 2 DEC: 1.1 x 1000/ÂµL — ABNORMAL HIGH (ref 0.00–1.00)
BKR WAM MONOCYTES (DIFF): 10.3 % (ref 4.0–12.0)
BKR WAM MYELOCYTES (DIFF) 1 DEC: 0.9 % — ABNORMAL HIGH (ref 0.0–0.0)
BKR WAM NEUTROPHILS (DIFF): 76.9 % — ABNORMAL HIGH (ref 39.0–72.0)
BKR WAM NEUTROPHILS - ABS (DIFF) 2 DEC: 8.46 x 1000/ÂµL — ABNORMAL HIGH (ref 2.00–7.60)

## 2022-10-23 LAB — LACTATE DEHYDROGENASE: BKR LACTATE DEHYDROGENASE: 432 U/L — ABNORMAL HIGH (ref 122–241)

## 2022-10-23 LAB — RETICULOCYTES
BKR WAM IRF: 26.2 % — ABNORMAL HIGH (ref 3.0–15.9)
BKR WAM RETICULOCYTE - ABS (3 DEC): 0.144 10Ë6 cells/uL — ABNORMAL HIGH (ref 0.023–0.140)
BKR WAM RETICULOCYTE COUNT PCT (1 DEC): 4.9 % — ABNORMAL HIGH (ref 0.6–2.7)
BKR WAM RETICULOCYTE HGB EQUIVALENT: 35.1 pg (ref 28.2–35.7)

## 2022-10-23 LAB — PHOSPHORUS     (BH GH L LMW YH): BKR PHOSPHORUS: 2.4 mg/dL — ABNORMAL LOW (ref 2.2–4.5)

## 2022-10-23 LAB — MAGNESIUM: BKR MAGNESIUM: 1.5 mg/dL — ABNORMAL LOW (ref 1.7–2.4)

## 2022-10-23 MED ORDER — MAGNESIUM SULFATE 2 GRAM/50 ML (4 %) IN WATER INTRAVENOUS PIGGYBACK
2504 gram/50 mL (4 %) | Status: CP
Start: 2022-10-23 — End: 2022-10-28

## 2022-10-23 NOTE — Progress Notes
Brief Oncology Nutrition NoteLocation of visit: [facility]Type of visit: follow upDiagnosis/Treatment: AML (s/p SCT)Past Medical History: reviewed available dataNutritionally relevant medications/supplements: compazine, zofran, folic acid, magnesium oxide, tacrolimusNutritionally relevant labs: reviewedBMI: Estimated body mass index is 30.23 kg/m? as calculated from the following:  Height as of 09/09/22: 5' 11 (1.803 m).  Weight as of an earlier encounter on 10/23/22: 98.3 kg.   IBW: 86 kgWeight changes and assessment:Wt: 10/23/22 98.3 kg 10/20/22 99.7 kg 10/16/22 100.9 kg 10/13/22 101.3 kg 10/10/22 100.7 kg 09/16/22 104.2 kg Weight loss of 5.6% in about five weeks, significant for timeframe.Nutrition Focused Physical Findings: Mild depletion on visual assessment (orbital, temple) Nutritional Needs: 2580 calories (30kcal/kg), 103-129 gm pro (1.2-1.5g protein/kg) based on AML with treatment, IBW  Met with patient and his wife in the [facility]. He continues to report low appetite and low PO intake. He eats frequently throughout the day, but meal portions are about 25-50% of his usual. Provided written nutrition education and protein samples as previously discussed over the telephone earlier this week. Gave samples of [employer] vanilla and chocolate powders, and [employer] 1.4 vanilla shake. Provided nutrition education on FASS Fixes for Taste Change, Nausea and Vomiting, and Practical Ideas for Increasing Calories and Protein. Discussed strategies to increase PO intake and answered patient questions. Plan to follow up as needed.  Coordination with providers: RN aware of visitFollow up: as needed; patient provided with RD contact informationLength of time spent with pt: 30 minutesSeen ZO:XWRUEA Rodd Heft, RD, CDN, MSElectronically Signed by Magda Paganini, RD, October 23, 2022

## 2022-10-23 NOTE — Progress Notes
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 Sherrell Puller INTERIM HISTORY: Douglas [name] is day 19 post transplant. His hospital course was complicated by neutropenic fever and folliculitis. Pt was transfused to maintain Hgb>7 and plts > 10 or if clinically indicated. He received 4 pRBC transfusion and 11 transfusion of platelets. Developed a maculopapular on back and abdomen. Overall, he is doing well. He did biopsy of rash. He has been using steroid cream with benefit. He reports nausea which was worse yesterday. He also reports abnormal food taste which is  improved. He had diarrhea in the hospital. He had  3 bowel movements yesterday however, loose stool is resolved. He denies sore throat. He denies cold, fever, or interim infection.  We reviewed the current medication list and made the necessary changes to chart. HPI:   Douglas [name] has a hx of Hypertension, hyperlipidemia, basal cell cancer of the skin, iron deficiency anemia.  The patient was evaluated for cytopenias in 2021 in [place].  Initial evaluation from [facility], source records are not available today.  Per the patient's subsequent records, he initially presented with leukopenia, thrombocytopenia, and monocytosis.  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  Initial bone marrow was performed on August 24, 2019, was reviewed at [facility] with findings of multilineage dysplasia.  The marrow was 40% cellular.  Megakaryocytes showed frequent dysplasia with  hyperchromatic forms and clustering.  Blasts were not increased.  A FISH panel for MDS  loci was negative.  Karyotype was normal.  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  The patient was observed and was without any treatment or transfusions.  In 2023, he had an acute drop in hemoglobin, was found to be iron deficient.  Workup for this did not show a clear focus of blood loss or bleeding.  The patient has continued evaluation and established care with Dr. Sherrell Puller in June 2023 as he spends some summers in [place].  Of note, there is a label of CLL in the patient's chart, which I suspect was a typo, and find no evidence that support that diagnosis.  Dr. Dema Severin assessment in June 2023 per the CPSS molecular score being considered intermediate one with a low risk of development of leukemia and median survival over 5 years.  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  The patient had a marrow performed at [facility] on 29th with CD34 stain showing 10% to 15% blasts focally more than 20%.  Marrow was hypercellular at 70%.  Dysplastic megakaryocytes again appreciated.  Molecular studies are pending.  Given the findings, treatment with the decitabine and venetoclax was recommended.  A referral has been placed for urgent EGD colonoscopy given the low iron concern for occult source of bleeding.He started treatment last week which he is tolerated well. He received 5-6 red blood cell transfusion in the past several month. He was suffering weakness and dyspnea which is now improved after transfusion. He does not have a significant infectious history, he did have COVID-19 in the past from which he recovered. He has hypertension and hyperlipidemia managed by cardiologist in [place]. He reports a negative stress test in  the past. He has had number of skin cancers and has had several MOHS procedures. He is followed with a urologist for some BPH and abnormal PSA. He has had 2 prostate biopsies which was negative. He carries a diagnosis of iron deficiency and require transfusion in [place] after a significant and acute drop in hemoglobin. There was no clinical evidence of bleeding. Endoscopy and colonoscopy are planned to exclude a source of bleeding. Douglas [name] is a 72 yo gentlemen that lives with is wife in [place].  He is currently spending the summer in [place] visiting family.  He has 2 daughters and 5 grandchildren.  Retired, having worked for [employer].  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  He is a social drinker of 1-2 beers per week, denies illicit drug use and is a non smoker.  Covid-19 vaccine series completed. 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)   CLL (chronic lymphocytic leukemia) (HC Code)   Depression   DOE (dyspnea on exertion)   Enlarged prostate   Essential hypertension   Family history of ischemic heart disease   Glaucoma   Hypertensive heart disease without heart failure   Mixed hyperlipidemia  Surgical HistoryPast Surgical History: Procedure Laterality Date  ADENOIDECTOMY    BONE MARROW BIOPSY    PROSTATE BIOPSY    TONSILLECTOMY    VASCULAR SURGERY   AllergiesAllergies Allergen Reactions  Voriconazole Other (See Comments)   Gingival edema/redness making it difficult to bite down  Current Outpatient Medications:   acyclovir (ZOVIRAX) 400 mg tablet, Take 1 tablet (400 mg total) by mouth 2 (two) times daily., Disp: 60 tablet, Rfl: 11  amLODIPine (NORVASC) 10 mg tablet, Take 1 tablet (10 mg total) by mouth., Disp: , Rfl:   amLODIPine (NORVASC) 10 mg tablet, Take 1 tablet (10 mg total) by mouth daily., Disp: , Rfl:   finasteride (PROSCAR) 5 mg tablet, TAKE 1 TABLET BY MOUTH DAILY FOR ENLARGED PROSTATE WITH URINATION PROBLEM TAKE WITH OR WITHOUT MEALS, Disp: , Rfl:   folic acid (FOLVITE) 1 mg tablet, Take 1 tablet (1 mg total) by mouth daily., Disp: 30 tablet, Rfl: 11  isavuconazonium (CRESEMBA) 186 mg capsule, Take 2 capsules (372 mg total) by mouth daily., Disp: 60 capsule, Rfl: 0  latanoprost (XALATAN) 0.005 % ophthalmic solution, Place 1 drop into both eyes nightly., Disp: , Rfl:   magnesium oxide 400 mg magnesium Cap, Take 400 mg by mouth 2 (two) times daily., Disp: , Rfl:   Miscellaneous Medical Supply, Please provide rolling walker, Use as directed., Disp: 1 each, Rfl: 0  multivitamin tablet, Take 1 tablet by mouth daily., Disp: 30 tablet, Rfl: 11  ondansetron (ZOFRAN) 8 mg tablet, Take 1 tablet (8 mg total) by mouth every 8 (eight) hours as needed for nausea or vomiting., Disp: 20 tablet, Rfl: 2  pentamidine (PENTAM) 300 mg inhalation solution, Inhale 6 mLs (300 mg total) into the lungs every 28 days. Last given 09/19/22, Disp: , Rfl:   prochlorperazine (COMPAZINE) 10 mg tablet, Take 1 tablet (10 mg total) by mouth every 6 (six) hours as needed for nausea., Disp: 30 tablet, Rfl: 3  sodium chloride 0.9 % flush, Use 10 mLs by  IV Push route daily. In both lumens, Disp: 600 mL, Rfl: 11  tacrolimus (PROGRAF) 0.5 mg Immediate Release capsule, Take 1 capsule (0.5 mg total) by mouth 2 (two) times daily. (Patient taking differently: Take 1 capsule (0.5 mg total) by mouth 2 (two) times daily. Total dose: 6.5mg  BID), Disp: 60 capsule, Rfl: 11  tacrolimus (PROGRAF) 1 mg immediate release capsule, Take 1 capsule (1 mg total) by mouth 2 (two) times daily. (Patient taking differently: Take 1 capsule (1 mg total) by mouth 2 (two) times daily. Total dose: 6.5mg  BID), Disp: 60 capsule, Rfl: 11  tacrolimus (PROGRAF) 5 mg Immediate Release capsule, Take 1 capsule (5 mg total) by mouth 2 (two) times daily. (Patient taking differently: Take 1 capsule (5 mg total) by mouth 2 (two) times daily. 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 (HC Code)   CLL (chronic lymphocytic leukemia) (HC Code)   Depression   DOE (dyspnea on exertion)   Enlarged prostate   Essential hypertension   Family history of ischemic heart disease   Glaucoma   Hypertensive heart disease without heart failure   Mixed hyperlipidemia  Family History Problem Relation Age of Onset  Breast cancer Mother   Liver cancer Mother   Heart disease Father   Prostate cancer Father   Breast cancer Sister   Thyroid disease Sister   Hearing loss Sister   Diabetes Daughter   No Known Problems Daughter   No Known Problems Grandchild  Social History Socioeconomic History  Marital status: Married   Spouse name: Not on file  Number of children: Not on file  Years of education: Not on file  Highest education level: Not on file Occupational History  Not on file Tobacco Use  Smoking status: Never  Smokeless tobacco: Never Vaping Use  Vaping Use: Never used Substance and Sexual Activity  Alcohol use: Yes   Alcohol/week: 2.0 standard drinks of alcohol   Types: 2 Cans of beer per week   Comment: social drinker once a week  Drug use: Never  Sexual activity: Not on file Other Topics Concern  Not on file Social History Narrative  Not on file Social Determinants of Health Financial Resource Strain: Low Risk  (08/05/2021)  Overall Financial Resource Strain (CARDIA)   Difficulty of Paying Living Expenses: Not very hard Food Insecurity: No Food Insecurity (09/18/2022)  Hunger Vital Sign   Worried About Running Out of Food in the Last Year: Never true   Ran Out of Food in the Last Year: Never true Transportation Needs: No Transportation Needs (09/18/2022)  PRAPARE - Designer, jewellery (Medical): No   Lack of Transportation (Non-Medical): No Physical Activity: Not on file Stress: Not on file Social Connections: Not on file Intimate Partner Violence: Not on file Housing Stability: Low Risk  (09/18/2022)  Housing Stability   Housing Stability: I have a steady place to live   Housing Stability: Not on file EXAM:Temp:  [98.3 ?F (36.8 ?C)] 98.3 ?F (36.8 ?C)Pulse:  [84] 84Resp:  [18] 18BP: (134)/(76) 134/76SpO2:  [99 %] 99 %General Appearance:He is heavy set, appears wellHEENT:  Mucous membranes moist, No oral lesions, no erythema, teeth are adequate repair  Lungs: ClearCor: RRR, no m/r/gAbd: soft nontender, no hepatosplenomegaly, Extr: trace edema.  Skin: some follicular erythema noted on the chest and abdomen, which is mild. Lymph Nodes - no palpable adenopathy. Neurologic: mentation appears normal, normal speech, strength symmetric. LABSLABSLab Results Component Value Date  WBC 3.9 (L) 10/13/2022  HGB 8.3 (L) 10/13/2022  HCT 25.10 (L) 10/13/2022  MCV 85.4 10/13/2022  PLT 44 (L) 10/13/2022 Comprehensive Metabolic Panel:Lab Results Component Value Date  GLU 109 (H) 10/13/2022  BUN 11 10/13/2022  CREATININE 0.91 10/13/2022  NA 139 10/13/2022  K 3.6 10/13/2022  CL 105 10/13/2022  CO2 20 10/13/2022  ALBUMIN 3.6 10/13/2022  PROT 6.6 10/13/2022  BILITOT 0.4 10/13/2022  ALKPHOS 95 10/13/2022  ALT 22 10/13/2022  GLOB 3.0 10/13/2022  CALCIUM 8.8 10/13/2022 Results in Past 7 DaysResult Component Current Result LD 452 (H) (10/13/2022) ]A/P:  RYIAN ACKROYD is 72 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  His disease is characterized by minor cytopenias over a long period of observation with recently worsened anemia, thrombocytopenia, and a marrow with increased blasts consistent with disease progression. 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 Therapy was   started with decitabine and venetoclax, and transplantation  recommended given the now increased risk of shorten survival. The patient has had a formal  search of the registry and a 10/10 match was identified. I spent an hour with Douglas [name] and his wife today discussing our plan for admission for matched unrelated donor allograft to treat his CMML.  We are waiting for bone marrow results as he is not recovered from his last treatment. 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 If the bone marrow shows this he will need alternate therapy and understand transplant would be postponed. If the bone marrow is hypoplastic we may choose to proceed with reduced intensity transplant prior to recovery. Plan to discuss this with Doctor Sherrell Puller. The plan would be to admit on August 2nd for reduced intensity conditioning with melphalan and fludarabine. A Mel dose of 70mg /m2 would be employed in that event and the dose of ptcy would be modified to 37.5mg /kg x 2.   Today, we reviewed the nature of the conditioning regimen and anticipated potential side effects including mucositis, fever, and need for transfusions. We reviewed the graft-versus-host disease preventative regimen of tacrolimus and the follow up schedule. The risks of GvHD, infection and need for prolonged immunosuppression were reviewed.   Given that he has not shown signs of GI bleeding I think the best course of action is to defer colonoscopy until a later date. We plan to follow up the area of the spine with in MRI in 3-6 months.  Patient and his wife asked questions that were answered and voiced agreement in proceeding. A/P: Day +19, no evidence of gvhd. 1) Disease - Plan bone marrow biopsy day 302) GVHD - no evidence, continue tac. Goal is tac 5-10, check weekly ldh, haptoglobin, triglycerides3) Heme - early engraftment. Donor, Recipient both blood type A+ve. 4) ID - Prophylaxis with acyclovir, pentamidine, Cresemba. He is not at risk for CMV. 5) Hypertension - He is on continued Norvasc. Dr. Chrystine Oiler MD Scribed for Chrystine Oiler, MD by Simona Huh, medical scribe October 13, 2022 The documentation recorded by the scribe accurately reflects the services I personally performed and the decisions made by me. I reviewed and confirmed all material entered and/or pre-charted by the scribe.

## 2022-10-23 NOTE — Progress Notes
Mr. Lopiano to clinic today for f/u D+29 allo. He reports feeling very well today and he has walked to clinic from the suites. Reports ongoing taste changes which are affecting his appetite but he is eating a minimum of 3 meals per day. Denies diarrhea. Reports some nausea r/t taking his meds in the morning for which he is using Zofran w/ + effect.Hickman dressing c/d/I. Labs drawn as ordered. Mag 1.5. All other labs WDL. 2g IV magnesium given per sliding scale. Tolerated well. Pt seen by MD Lenard Forth and d/c'd from clinic. RTC Tuesday for f/u and bmbx.

## 2022-10-24 LAB — TACROLIMUS LEVEL     (BH GH L LMW YH): BKR TACROLIMUS BLOOD: 8 ng/mL (ref 2.0–3.9)

## 2022-10-27 ENCOUNTER — Encounter: Admit: 2022-10-27 | Payer: PRIVATE HEALTH INSURANCE | Attending: Medical Oncology

## 2022-10-27 ENCOUNTER — Encounter: Admit: 2022-10-27 | Payer: PRIVATE HEALTH INSURANCE

## 2022-10-27 ENCOUNTER — Inpatient Hospital Stay: Admit: 2022-10-27 | Discharge: 2022-10-27 | Payer: MEDICARE

## 2022-10-27 ENCOUNTER — Ambulatory Visit: Admit: 2022-10-27 | Payer: MEDICARE

## 2022-10-27 ENCOUNTER — Telehealth: Admit: 2022-10-27 | Payer: PRIVATE HEALTH INSURANCE | Attending: Hematology & Oncology

## 2022-10-27 ENCOUNTER — Ambulatory Visit: Admit: 2022-10-27 | Payer: MEDICARE | Attending: Pharmacist Clinician (PhC)/ Clinical Pharmacy Specialist

## 2022-10-27 DIAGNOSIS — Z79624 Long term (current) use of inhibitors of nucleotide synthesis: Secondary | ICD-10-CM

## 2022-10-27 DIAGNOSIS — C931 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: Secondary | ICD-10-CM

## 2022-10-27 DIAGNOSIS — D84821 Immunodeficiency due to drugs: Secondary | ICD-10-CM

## 2022-10-27 DIAGNOSIS — I10 Essential (primary) hypertension: Secondary | ICD-10-CM

## 2022-10-27 DIAGNOSIS — C4491 Basal cell carcinoma of skin, unspecified: Secondary | ICD-10-CM

## 2022-10-27 DIAGNOSIS — Z9481 Bone marrow transplant status: Secondary | ICD-10-CM

## 2022-10-27 DIAGNOSIS — I119 Hypertensive heart disease without heart failure: Secondary | ICD-10-CM

## 2022-10-27 DIAGNOSIS — Z9484 Stem cells transplant status: Secondary | ICD-10-CM

## 2022-10-27 DIAGNOSIS — Z79899 Other long term (current) drug therapy: Secondary | ICD-10-CM

## 2022-10-27 DIAGNOSIS — H409 Unspecified glaucoma: Secondary | ICD-10-CM

## 2022-10-27 DIAGNOSIS — Z8249 Family history of ischemic heart disease and other diseases of the circulatory system: Secondary | ICD-10-CM

## 2022-10-27 DIAGNOSIS — R110 Nausea: Secondary | ICD-10-CM

## 2022-10-27 DIAGNOSIS — R0609 Other forms of dyspnea: Secondary | ICD-10-CM

## 2022-10-27 DIAGNOSIS — F419 Anxiety disorder, unspecified: Secondary | ICD-10-CM

## 2022-10-27 DIAGNOSIS — E782 Mixed hyperlipidemia: Secondary | ICD-10-CM

## 2022-10-27 DIAGNOSIS — C9110 Chronic lymphocytic leukemia of B-cell type not having achieved remission: Secondary | ICD-10-CM

## 2022-10-27 DIAGNOSIS — Z7969 Long term (current) use of other immunomodulators and immunosuppressants: Secondary | ICD-10-CM

## 2022-10-27 DIAGNOSIS — C9200 Acute myeloblastic leukemia, not having achieved remission: Secondary | ICD-10-CM

## 2022-10-27 DIAGNOSIS — Z5181 Encounter for therapeutic drug level monitoring: Secondary | ICD-10-CM

## 2022-10-27 DIAGNOSIS — Z79621 Long term (current) use of calcineurin inhibitor: Secondary | ICD-10-CM

## 2022-10-27 DIAGNOSIS — N400 Enlarged prostate without lower urinary tract symptoms: Secondary | ICD-10-CM

## 2022-10-27 DIAGNOSIS — R5381 Other malaise: Secondary | ICD-10-CM

## 2022-10-27 DIAGNOSIS — F32A Depression: Secondary | ICD-10-CM

## 2022-10-27 DIAGNOSIS — R634 Abnormal weight loss: Secondary | ICD-10-CM

## 2022-10-27 DIAGNOSIS — R638 Other symptoms and signs concerning food and fluid intake: Secondary | ICD-10-CM

## 2022-10-27 LAB — COMPREHENSIVE METABOLIC PANEL
BKR A/G RATIO: 1.3 (ref 1.0–2.2)
BKR ALANINE AMINOTRANSFERASE (ALT): 34 U/L (ref 9–59)
BKR ALBUMIN: 3.8 g/dL (ref 3.6–5.1)
BKR ALKALINE PHOSPHATASE: 102 U/L (ref 9–122)
BKR ANION GAP: 10 (ref 7–17)
BKR ASPARTATE AMINOTRANSFERASE (AST): 29 U/L (ref 10–35)
BKR AST/ALT RATIO: 0.9
BKR BILIRUBIN TOTAL: 0.3 mg/dL (ref <=1.2)
BKR BLOOD UREA NITROGEN: 12 mg/dL (ref 8–23)
BKR BUN / CREAT RATIO: 10.9 (ref 8.0–23.0)
BKR CALCIUM: 9 mg/dL (ref 8.8–10.2)
BKR CHLORIDE: 106 mmol/L (ref 98–107)
BKR CO2: 22 mmol/L (ref 20–30)
BKR CREATININE: 1.1 mg/dL (ref 0.40–1.30)
BKR EGFR, CREATININE (CKD-EPI 2021): >60 mL/min/{1.73_m2} (ref >=60)
BKR GLOBULIN: 3 g/dL (ref 2.0–3.9)
BKR GLUCOSE: 137 mg/dL — ABNORMAL HIGH (ref 70–100)
BKR POTASSIUM: 4.4 mmol/L (ref 3.3–5.3)
BKR PROTEIN TOTAL: 6.8 g/dL (ref 5.9–8.3)
BKR SODIUM: 138 mmol/L (ref 136–144)

## 2022-10-27 LAB — CBC WITH AUTO DIFFERENTIAL
BKR WAM ABSOLUTE NRBC (2 DEC): 0.02 x 1000/ÂµL (ref 0.00–1.00)
BKR WAM HEMATOCRIT (2 DEC): 29.9 % — ABNORMAL LOW (ref 38.50–50.00)
BKR WAM HEMOGLOBIN: 10 g/dL — ABNORMAL LOW (ref 13.2–17.1)
BKR WAM MCH (PG): 31.3 pg (ref 27.0–33.0)
BKR WAM MCHC: 33.4 g/dL (ref 31.0–36.0)
BKR WAM MCV: 93.4 fL (ref 80.0–100.0)
BKR WAM MPV: 10.5 fL (ref 8.0–12.0)
BKR WAM NUCLEATED RED BLOOD CELLS: 0.4 % (ref 0.0–1.0)
BKR WAM PLATELETS: 218 x1000/ÂµL (ref 150–420)
BKR WAM RDW-CV: 22.7 % — ABNORMAL HIGH (ref 11.0–15.0)
BKR WAM RED BLOOD CELL COUNT.: 3.2 M/ÂµL — ABNORMAL LOW (ref 4.00–6.00)
BKR WAM WHITE BLOOD CELL COUNT: 5.7 x1000/ÂµL (ref 4.0–11.0)

## 2022-10-27 LAB — MANUAL DIFFERENTIAL
BKR WAM BANDS (DIFF) (1 DEC): 0.9 % (ref 0.0–10.0)
BKR WAM BASOPHIL - ABS (DIFF) 2 DEC: 0 x 1000/ÂµL (ref 0.00–1.00)
BKR WAM BASOPHILS (DIFF): 0 % (ref 0.0–1.4)
BKR WAM EOSINOPHILS (DIFF) 2 DEC: 0.05 x 1000/ÂµL (ref 0.00–1.00)
BKR WAM EOSINOPHILS (DIFF): 0.9 % (ref 0.0–5.0)
BKR WAM LYMPHOCYTE - ABS (DIFF) 2 DEC: 0.69 x 1000/ÂµL (ref 0.60–3.70)
BKR WAM LYMPHOCYTES (DIFF): 12.2 % — ABNORMAL LOW (ref 17.0–50.0)
BKR WAM MONOCYTE - ABS (DIFF) 2 DEC: 0.79 x 1000/ÂµL (ref 0.00–1.00)
BKR WAM MONOCYTES (DIFF): 13.9 % — ABNORMAL HIGH (ref 4.0–12.0)
BKR WAM MYELOCYTES (DIFF) 1 DEC: 2.6 % — ABNORMAL HIGH (ref 0.0–0.0)
BKR WAM NEUTROPHILS (DIFF): 69.5 % (ref 39.0–72.0)
BKR WAM NEUTROPHILS - ABS (DIFF) 2 DEC: 3.99 x 1000/ÂµL (ref 2.00–7.60)

## 2022-10-27 LAB — RETICULOCYTES
BKR WAM IRF: 28 % — ABNORMAL HIGH (ref 3.0–15.9)
BKR WAM RETICULOCYTE - ABS (3 DEC): 0.144 10Ë6 cells/uL — ABNORMAL HIGH (ref 0.023–0.140)
BKR WAM RETICULOCYTE COUNT PCT (1 DEC): 4.5 % — ABNORMAL HIGH (ref 0.6–2.7)
BKR WAM RETICULOCYTE HGB EQUIVALENT: 40 pg — ABNORMAL HIGH (ref 28.2–35.7)

## 2022-10-27 LAB — BONE MARROW SMEAR, ASPIRATION, AND STAIN      (L YH)

## 2022-10-27 LAB — PT/INR AND PTT (BH GH L LMW YH)
BKR INR: 1.12 x 1000/ÂµL — ABNORMAL HIGH (ref 0.86–1.12)
BKR PARTIAL THROMBOPLASTIN TIME: 26.8 seconds — ABNORMAL HIGH (ref 23.0–31.4)
BKR PROTHROMBIN TIME: 12.3 seconds — ABNORMAL HIGH (ref 9.6–12.3)

## 2022-10-27 LAB — LACTATE DEHYDROGENASE: BKR LACTATE DEHYDROGENASE: 381 U/L — ABNORMAL HIGH (ref 122–241)

## 2022-10-27 LAB — TACROLIMUS LEVEL     (BH GH L LMW YH): BKR TACROLIMUS BLOOD: 9.3 ng/mL — AB

## 2022-10-27 LAB — TRIGLYCERIDES: BKR TRIGLYCERIDES: 260 mg/dL — ABNORMAL HIGH

## 2022-10-27 LAB — HAPTOGLOBIN: BKR HAPTOGLOBIN: 166 mg/dL — AB (ref 30–200)

## 2022-10-27 LAB — PHOSPHORUS     (BH GH L LMW YH): BKR PHOSPHORUS: 2.3 mg/dL — ABNORMAL LOW (ref 2.2–4.5)

## 2022-10-27 LAB — MAGNESIUM: BKR MAGNESIUM: 1.5 mg/dL — ABNORMAL LOW (ref 1.7–2.4)

## 2022-10-27 MED ORDER — ONDANSETRON HCL 8 MG TABLET
8 mg | ORAL_TABLET | Freq: Three times a day (TID) | ORAL | 3 refills | 10.00 days | Status: AC | PRN
Start: 2022-10-27 — End: ?
  Filled 2022-10-27: qty 30, 10d supply, fill #0

## 2022-10-27 MED ORDER — MAGNESIUM SULFATE 2 GRAM/50 ML (4 %) IN WATER INTRAVENOUS PIGGYBACK
2 | Freq: Once | INTRAVENOUS | Status: CP
Start: 2022-10-27 — End: ?
  Administered 2022-10-27: 15:00:00 2 mL/h via INTRAVENOUS

## 2022-10-27 NOTE — Other
Post-HSCT Follow-Up Pharmacist note Diagnosis: CMMLConditioning:   Fludarabine/Melphalan with post-transplant Cyclophosphamide Transplant Type:10/10 MUD Allogeneic PBSCTTransplant Date (Day 0): 09/24/22 Referring Provider: Chrystine [name], MDEncounter Date: 10/27/2022 = D+33[name] is a 72 y.o. year-old male with history of CMML s/p HSCT. Patient is being seen by the clinic pharmacist for medication management included post stem cell transplant, hematological related disorders and GI related disorders .  This visit was completed as a joint-visit with provider present and is aware of the care plan.Subjective: Having loose stools intermittent, has been using loperamide.  Took one dose todayUtilizing zofran and compazine as needed for nausea.  Needs refill of zofran Assessment and Recommendations:  Summary of Medication Changes and Follow-up Items Discussed with Provider:Tacrolimus 9.3 -  continue current doseNeeds isavuconazole refill, looks like it might be two early will look at the suites for an additional boxGVHD (immunosuppression)Tacrolimus: Current dose: 6.5 mg PO twice daily09/10/24  level: 9.3 -  continue current doseGoal range: 6-11 ng/mLDosage dispensed: 0.5mg , 1mg , 5mg  capsulesPharmacy dispensed: OPSPost transplant cyclophosphamide: completed inpatient *All immunosuppression is managed by the bone marrow transplant team.Infectious Disease (prophylaxis, monitoring, and active infections)ViralCurrent prophylaxis/treatment: Acyclovir 400mg  PO twice dailyPrevious viral infections: 	HSV/VZV +/?CMV -/?, 9/3 CMV negativeEBV IgG negativeBK virus N/APneumocystis jeuvecii pneumoniaCurrent prophylaxis/treatment: Pentamidine 300mg  inhaled monthly last given 8/30/24Previous PJP infection: N/AT-cell subsetDay +100 (date):Day +180 (date):1 year (date):Toxoplasmosis IgG negativeCurrent prophylaxis/treatment: NoneFungal/MoldCurrent prophylaxis/treatment: Isavuconazole 372mg  PO daily - history of prolonged cytopenias Previous fungal/mold infection: N/A, Hx of gingival changes to voriconazole Drug monitoring: N/AGalactomann - 10/01/22 negFungitell - 10/01/22 < 31Bacterial	Current prophylaxis/treatment: None	Previous bacterial infection: N/A	Drug monitoring: N/A	Blood cultures: N/ASupportive careHyperlipidemia monitoring (triglycerides, Cholesterol, LDL, HDL)Current treatment: None	Prior treatment: Atorvastatin 40mg  PO daily, held with transplantBaseline (09/18/2022): 68 Today (10/27/22): 260Hypertension monitoring (blood pressure)Current treatment: Amlodipine 10mg  PO dailyPrior treatment: Metoprolol and lisinopril on hold since transplant Today (10/27/22):  129/74GastrointestionalStress ulcer prophylaxis: None, one episode of acid refluxNausea/Vomiting: Intermittent nausea, takes ondansetron with good effect but prefers prochlorperazine, Diarrhea/constipation: Continues to have soft stools, loperamidine prnOral care and mucositis: NoneVeno-occlusive diseaseCurrent treatment:  Ursodiol 500mg  PO twice daily until day +90Anticoagulation	Current prophylaxis/treatment: None	Previous anticoagulation history: Aspirin 81mg  PO daily, for cardiac ppx - not restartingElectrolyte supplementation:	Current treatment: Magnesium oxide 400mg  BID	Monitoring: Mg 1.5 09/10/24Hypogammaglobulinemia	Current treatment:	Goal: 500-800 ng/dLIVIG last dose:Baseline IgG level:Last IgG level:IgG scheduled levelsDay +100 (date):Day +180 (date):	1 year (date):	Maintenance Therapy Plan: VaccinationsBaseline titers: 	Hepatitis B: 	Pneumococcal: 	VZV 	Measles: , Mumps: , Rubella: 12 month: 14 month: 18 month:	Hep B titer	Pneumococcal titers24 month:Titers drawn 2 months after 24 months:Redose needed:Medication History:Douglas [name] presents to today's visit with their med action plan.  A medication history was obtained from the patient and spouse.Adherence:The patient reports 0 missed doses of medications in the past 1 week(s). The patient does not report the barriers to adherence. Discussed potential outcomes associated with non-adherenceAffordability:The patient denies concerns with affordability of medications at this time. Medication Management:Plan for medication management assistance: An updated medication action plan was given to the patient, no changesPatient would benefit from continued pharmacy follow-up at future clinic visits for medication managementEducated patient on the above changesPatient did not request refills todayObjective: ZDG:[id number] Results (from the past 168 hour(s)) Manual Differential  Collection Time: 10/27/22 10:17 AM Result Value Ref Range  Neutrophils 69.5 39.0 - 72.0 %  Bands 0.9 0.0 - 10.0 %  Lymphocytes 12.2 (L) 17.0 - 50.0 %  Monocytes 13.9 (H) 4.0 - 12.0 %  Eosinophils 0.9 0.0 - 5.0 %  Basophil 0.0 0.0 - 1.4 %  Myelocytes 2.6 (H) 0.0 - 0.0 %  Neutrophils Absolute 3.99 2.00 - 7.60 x 1000/?L  Lymphocyte Absolute 0.69 0.60 - 3.70 x 1000/?L  Monocyte Absolute Count 0.79 0.00 - 1.00 x 1000/?L  Eosinophil Absolute Count 0.05 0.00 - 1.00 x 1000/?L  Basophil Absolute Count 0.00 0.00 - 1.00 x 1000/?L  RBC Morphology Reviewed   Elliptocytes 1+ (A) None  Polychromasia 1+ (A) None  Giant PLTs Present None CBC auto differential  Collection Time: 10/27/22 10:17 AM Result Value Ref Range  WBC 5.7 4.0 - 11.0 x1000/?L  RBC 3.20 (L) 4.00 - 6.00 M/?L  Hemoglobin 10.0 (L) 13.2 - 17.1 g/dL  Hematocrit 66.06 (L) 30.16 - 50.00 %  MCV 93.4 80.0 - 100.0 fL  MCH 31.3 27.0 - 33.0 pg  MCHC 33.4 31.0 - 36.0 g/dL  RDW-CV 01.0 (H) 93.2 - 15.0 %  Platelets 218 150 - 420 x1000/?L  MPV 10.5 8.0 - 12.0 fL  nRBC 0.4 0.0 - 1.0 % Absolute nRBC 0.02 0.00 - 1.00 x 1000/?L TFT:[id number] Results (from the past 168 hour(s)) Comprehensive metabolic panel  Collection Time: 10/27/22 10:17 AM Result Value Ref Range  Sodium 138 136 - 144 mmol/L  Potassium 4.4 3.3 - 5.3 mmol/L  Chloride 106 98 - 107 mmol/L  CO2 22 20 - 30 mmol/L  Anion Gap 10 7 - 17  Glucose 137 (H) 70 - 100 mg/dL  BUN 12 8 - 23 mg/dL  Creatinine 5.42 7.06 - 1.30 mg/dL  Calcium 9.0 8.8 - 23.7 mg/dL  BUN/Creatinine Ratio 62.8 8.0 - 23.0  Total Protein 6.8 5.9 - 8.3 g/dL  Albumin 3.8 3.6 - 5.1 g/dL  Total Bilirubin 0.3 <=3.1 mg/dL  Alkaline Phosphatase 517 9 - 122 U/L  Alanine Aminotransferase (ALT) 34 9 - 59 U/L  Aspartate Aminotransferase (AST) 29 10 - 35 U/L  Globulin 3.0 2.0 - 3.9 g/dL  A/G Ratio 1.3 1.0 - 2.2  AST/ALT Ratio 0.9 Reference Range Not Established  eGFR (Creatinine) >60 >=60 mL/min/1.62m2 LDH: Recent Results (from the past 168 hour(s)) Lactate dehydrogenase  Collection Time: 10/27/22 10:17 AM Result Value Ref Range  LD 381 (H) 122 - 241 U/L Patient Education: The following education was provided for: Initiation, modification, or discontinuation of drug therapy, Assessment of efficacy and safety of drug therapy, and Adverse effect presentation, evaluation, and monitoring during today?s visit to the patient and spouse regarding medication management: purpose of each medication, side effects of medications, and importance of adherence with the regimen(s) Time Spent: 15 minutes, face-to-face consultPATIENT AND/OR CAREGIVER VERBALIZED UNDERSTANDING OF CARE PLAN: yesPATIENT ADVISED TO CALL BACK WITH QUESTIONS, CONCERNS, OR CHANGE IN SYMPTOMS.Signed by: Jorene Guest, PharmD BCPS BCOP09/10/24 11:19 AM

## 2022-10-27 NOTE — Telephone Encounter
Noted  

## 2022-10-27 NOTE — Progress Notes
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

## 2022-10-27 NOTE — Telephone Encounter
[facility] faxed over home care orders to be signed and faxed back- placed into NP7 folder for printing/review

## 2022-10-27 NOTE — Progress Notes
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 Douglas [name] continues to improve. Douglas [name] has been  getting PT at the hotel, no longer needed. No longer needs walker or cane, but uses it in hospital as added security. No chest pain, heart palpitations, cough or shortness of breath. Some nausea, no vomiting (did vomit 2-3 days ago once). Eating 40% of Douglas [name] normal, getting better. No diarrhea or constipation. Voiding without difficulty. No rash, fever or chills. Review of Systems Constitutional:  Positive for malaise/fatigue and weight loss (stable this week). Negative for chills and fever. HENT: Negative.  Negative for sore throat.  Eyes: Negative.  Respiratory: Negative.  Negative for cough, sputum production and shortness of breath.  Cardiovascular:  Negative for chest pain, palpitations and leg swelling. Gastrointestinal:  Positive for nausea and vomiting (once this week). Negative for abdominal pain, constipation and diarrhea.      Eating better, estimates 40% Genitourinary: Negative.  Negative for dysuria. Musculoskeletal: Negative.       Working with PT - ambulating now with cane instead of walker Skin: Negative.  Negative for itching and rash. Neurological:  Positive for weakness (getting better, no longer using walker or cane at home). Negative for dizziness, tremors and headaches. Psychiatric/Behavioral: Negative.   VoriconazoleCurrent Outpatient Medications on File Prior to Visit Medication Sig Dispense Refill  acyclovir (ZOVIRAX) 400 mg tablet Take 1 tablet (400 mg total) by mouth 2 (two) times daily. 60 tablet 11  amLODIPine (NORVASC) 10 mg tablet Take 1 tablet (10 mg total) by mouth daily.    finasteride (PROSCAR) 5 mg tablet TAKE 1 TABLET BY MOUTH DAILY FOR ENLARGED PROSTATE WITH URINATION PROBLEM TAKE WITH OR WITHOUT MEALS    folic acid (FOLVITE) 1 mg tablet Take 1 tablet (1 mg total) by mouth daily. 30 tablet 11  isavuconazonium (CRESEMBA) 186 mg capsule Take 2 capsules (372 mg total) by mouth daily. 60 capsule 0  latanoprost (XALATAN) 0.005 % ophthalmic solution Place 1 drop into both eyes nightly.    magnesium oxide 400 mg magnesium Cap Take 400 mg by mouth daily.    Miscellaneous Medical Supply Please provide rolling walker, Use as directed. 1 each 0  multivitamin tablet Take 1 tablet by mouth daily. 30 tablet 11  ondansetron (ZOFRAN) 8 mg tablet Take 1 tablet (8 mg total) by mouth every 8 (eight) hours as needed for nausea or vomiting. 20 tablet 2  pentamidine (PENTAM) 300 mg inhalation solution Inhale 6 mLs (300 mg total) into the lungs every 28 days. Last given 10/16/22    prochlorperazine (COMPAZINE) 10 mg tablet Take 1 tablet (10 mg total) by mouth every 6 (six) hours as needed for nausea. 30 tablet 3  sodium chloride 0.9 % flush Use 10 mLs by IV Push route daily. In both lumens 600 mL 11  tacrolimus (PROGRAF) 0.5 mg Immediate Release capsule Take 1 capsule (0.5 mg total) by mouth 2 (two) times daily. (Patient taking differently: Take 1 capsule (0.5 mg total) by mouth 2 (two) times daily. Total dose: 6.5  mg BID) 60 capsule 11  tacrolimus (PROGRAF) 1 mg immediate release capsule Take 1 capsule (1 mg total) by mouth 2 (two) times daily. (Patient taking differently: Take 1 capsule (1 mg total) by mouth 2 (two)  times daily. Total dose: 6.5mg  BID) 60 capsule 11  tacrolimus (PROGRAF) 5 mg Immediate Release capsule Take 1 capsule (5 mg total) by mouth 2 (two) times daily. (Patient taking differently: Take 1 capsule (5 mg total) by mouth 2 (two) times daily. Total dose: 6.5mg  BID) 60 capsule 11  ursodioL (URSO FORTE) 500 mg tablet Take 1 tablet (500 mg total) by mouth 2 times daily (0900, 1700). 60 tablet 6  [DISCONTINUED] venetoclax (VENCLEXTA) 100 mg tablet Take 4 tablets (400 mg total) by mouth once daily. Take with food. Swallow whole, do not crush. 120 tablet 0 Current Facility-Administered Medications on File Prior to Visit Medication Dose Route Frequency Provider Last Rate Last Admin  [COMPLETED] magnesium sulfate in water 2 gram/50 mL (4 %) (IVPB) 2 g  2 g Intravenous Once [name], APRN 50 mL/hr at 10/27/22 1032 2 g at 10/27/22 1032  magnesium sulfate in water 2 gram/50 mL (4 %) (IVPB)          BP: 129/74 (10/27/2022 10:32 AM), Pulse: (!) 95 (10/27/2022 10:32 AM), Temp: 98 ?F (36.7 ?C) (10/27/2022 10:32 AM), Resp: 18 (10/27/2022 10:32 AM), SpO2: 99 % (10/27/2022 10:32 AM), Pain Score: Zero (10/27/2022 10:15 AM), Weight: 98.3 kg (10/27/2022 10:15 AM), Weight (lbs): 216.71 (10/27/2022 10:15 AM)Wt Readings from Last 3 Encounters: 10/27/22 98.3 kg 10/23/22 98.3 kg 10/20/22 99.7 kg Physical ExamConstitutional:     Appearance: Normal appearance. Douglas [name] is not ill-appearing. HENT:    Head: Normocephalic.    Nose: Nose normal. No congestion.    Mouth/Throat:    Mouth: Mucous membranes are moist.    Pharynx: Oropharynx is clear. Eyes:    General: No scleral icterus.      Right eye: No discharge.       Left eye: No discharge. Cardiovascular:    Rate and Rhythm: Normal rate and regular rhythm.    Pulses: Normal pulses.    Heart sounds: Normal heart sounds. No murmur heard.Pulmonary:    Effort: Pulmonary effort is normal. No respiratory distress.    Breath sounds: Normal breath sounds. No wheezing or rales. Abdominal:    General: Abdomen is flat. Bowel sounds are normal. There is no distension.    Palpations: Abdomen is soft.    Tenderness: There is no abdominal tenderness. There is no guarding. Musculoskeletal:       General: No swelling.    Right lower leg: No edema.    Left lower leg: No edema. Skin:   General: Skin is warm.    Findings: No erythema or rash. Neurological:    Mental Status: Douglas [name] is alert and oriented to person, place, and time. Psychiatric:       Behavior: Behavior normal. Lab Results Component Value Date/Time  WBC 5.7 10/27/2022 10:17 AM  HGB 10.0 (L) 10/27/2022 10:17 AM  HCT 29.90 (L) 10/27/2022 10:17 AM  PLT 218 10/27/2022 10:17 AM  Lab Results Component Value Date/Time  NEUTROPHILS 36.1 (L) 10/20/2022 10:17 AM  Lab Results Component Value Date/Time  NA 138 10/27/2022 10:17 AM  K 4.4 10/27/2022 10:17 AM  CL 106 10/27/2022 10:17 AM  CO2 22 10/27/2022 10:17 AM  BUN 12 10/27/2022 10:17 AM  CREATININE 1.10 10/27/2022 10:17 AM  GLU 137 (H) 10/27/2022 10:17 AM  ANIONGAP 10 10/27/2022 10:17 AM  Lab Results Component Value Date/Time  CALCIUM 9.0 10/27/2022 10:17 AM  MG 1.5 (L) 10/27/2022 10:17 AM  PHOS 2.3 10/27/2022 10:17 AM  Lab Results Component Value Date/Time  ALT 34 10/27/2022 10:17 AM  AST  29 10/27/2022 10:17 AM  ALKPHOS 102 10/27/2022 10:17 AM  BILITOT 0.3 10/27/2022 10:17 AM  BILIDIR 0.2 09/11/2022 12:31 PM  ALBUMIN 3.8 10/27/2022 10:17 AM  Lab Results Component Value Date/Time  PTT 26.8 10/27/2022 10:17 AM  LABPROT 12.3 10/27/2022 10:17 AM  INR 1.12 10/27/2022 10:17 AM  Results in Past 7 DaysResult Component Current Result LD 381 (H) (10/27/2022) No results found for: RAPAMYCINResults in Past 7 DaysResult Component Current Result Tacrolimus Lvl 8.0 (10/23/2022) ]Assessment:Douglas [name] is a 72 y.o. 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 Douglas [name] is s/p 2 cycles of chemotherapy with Decitabine + Venetoclax with reduction in blast count. 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 Douglas [name] is engrafted on day +13 and is discharged on 10/10/22. Plan: Day + 33. No s/sx of GVHD. Tac therapeutic.  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 Current dose 6.5mg  BID.  Therapeutic goal of 6-11ng/ml.  Level 9.4 ng/ml.  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 Vaccines : 12, 14 , 16 and 24 months s/p transplantHealth Maintenance Day +30 Day +100 Day +180 1 Year Heme/Immune       T cell subsets/IGG Chimerism/Bone Marrow or PET/Imaging [x]  [] []  [] []  [] []  Pulmonary       PFT's  []  []  []  Cardiology       Echo       EKG   []  Only if cardiac risk factors  [] []  [] []  Endocrine       TSH/TFT's       Fasting Lipids       Glucose/A1c       Vit D/Calcium        Bone Density (women and those on steroids)Reproductive Endocrine        FSH/LH/Testosterone (both men & women)   []  [] [] [] [] [] [] []  Liver      Ferritin      MRI T2* quant (if ferritin abnormal)    [] []  Ophthalmology       Sicca/Cataracts   []  []  Dermatology/musculo-skeletal        GVHD (skin & ROM)        Cancer screening (Derm referral)            [] []  [] []   Dental       GVHD       Routine Care   []  []              Majhail, et al. (2012). Recommended Screening and Preventive Practices for Long-Term Survivors after                    Hematopoietic Cell Transplantation. Biology of Blood and Marrow Transplantation, Volume 18,                    Issue 3, March 2012, Pages 348-371.Conditioning/Transplant:-Melphalan 70 mg/m2 over 30 minutes on D-6, *Melphalan dosed reduced to 70 mg/m2 due to age-Fludarabine 40 mg/m2 over 30 minutes every 24hr for 4 doses from D-5 to D -2             -Stem cell infusion day 0 (09/24/22)RTC on 9/17/24Amy B Nichola Warren, APRNOn the day of this patient's encounter, a total of >40 minutes was personally spent by me.  This does not include any time spent performing a procedural service as noted below.Bone Marrow Aspiration/Biopsy Procedure NoteINFORMED CONSENT & TIME OUTThe patient was identified by the following methods (need 2):Name/DOBVerbal with patient and/or familyPotential risks including bleeding, infection and pain were reviewed with the patient.The following procedure verification steps were taken: Procedure confirmed with patient or family/designeeConsent for procedure signedRelevant documentation completed, reviewed, and signedClinical indications for procedure confirmed Site Marked: Not applicableTime-out was taken with all members of procedure team immediately prior to procedure and the following were confirmed: Correct patient identifiedAgreement on procedureCorrect side and siteCorrect patient positionAvailability of correct implant/equipmentPROCEDURE PERFORMED:  Bone Marrow Aspirate & BiopsySITE PERFORMED:  right posterior iliac crest, prepped with Betadine using sterile technique.LOCAL ANESTHESIA: 1% plain lidocaine subcutaneous injection, 5 ml administered.ORDERING PHYSICIAN:                            INDICATIONS:  post bone marrow transplant evaluationPATIENT POSITION: Patient was placed in the prone position.ESTIMATED BLOOD LOSS:  Minimal blood loss.  Hemostasis was maintained by direct pressure to the external puncture site.  A sterile, dry pressure bandage was applied.POST PROCEDURE PAIN LEVEL:  mild discomfortPATIENT CONDITION POST PROCEDURE: stableCOMPLICATIONS: Patient tolerated the procedure well and no complications were noted.SPECIMENS: flow, fish, surgical path, chimerism, ccsPATIENT INSTRUCTIONS: Resume normal activity unless significant discomfort or otherwise instructed.Maintain pressure dressing dry and intact for 24 hours.Dressing may be removed after 24 hours and replace with a Band-Aid if needed.May shower after 24 hours.For the first 48 hours the site should not be submerged in water.Ice packs to site(s) as needed for discomfort.Acetaminophen 1-2 tabs every 6 hours as needed for discomfort.

## 2022-10-28 LAB — MDS PLUS PANEL
BKR CD10+CD33+: 0 %
BKR CD10+CD34+: 13.8 %
BKR CD11B+CD16+: 33.6 %
BKR CD13+CD11B+: 93.6 %
BKR CD14+CD33+: 56 %
BKR CD14-CD64+: 7.7 %
BKR CD19+: 5.8 %
BKR CD19+CD38+: 4.2 %
BKR CD19+KAPPA+ MONO GATE: 0 %
BKR CD19+KAPPA+ MYELOID GATE: 0.1 %
BKR CD19+KAPPA+: 10.9 %
BKR CD19+LAMBDA+ MONO GATE: 0 %
BKR CD19+LAMBDA+ MYELOID GATE: 0.1 %
BKR CD19+LAMBDA+: 5 %
BKR CD3+CD16/56+: 0.1 %
BKR CD3+CD16/56-: 7.3 %
BKR CD3+CD4+ MONO GATE: 0.3 %
BKR CD3+CD4+: 2.3 %
BKR CD3+CD4+CD8+: 0.2 %
BKR CD3+CD5+ MONO GATE: 0 %
BKR CD3+CD5+ MYELOID GATE: 0.2 %
BKR CD3+CD5+: 6.9 %
BKR CD3+CD8+ MONO GATE: 0 %
BKR CD3+CD8+: 4.9 %
BKR CD3-CD16/56+: 11.1 %
BKR CD3-CD4+: 46.3 %
BKR CD33+ MONO GATE: 62.6 %
BKR CD33+: 19.9 %
BKR CD33+CD10+: 0.1 %
BKR CD33+CD34+: 3.2 %
BKR CD33+HLADR-: 2.6 %
BKR CD34+: 15.2 %
BKR CD34+CD10+: 0 %
BKR CD34+CD117+ MONO GATE: 0 % (ref 0–5)
BKR CD34+CD117+: 3.7 %
BKR CD34+CD33+ MONO GATE: 1.3 %
BKR CD34+CD64+ MONO GATE: 0 %
BKR CD34+CD64+: 0.4 %
BKR CD34+HLADR+: 5.5 %
BKR CD4/CD8 RATIO: 0.47
BKR CD45+CD10+CD33-: 19.7 %
BKR CD45+CD10-CD33+: 11.1 %
BKR CD45+CD11B+CD13+: 46.5 %
BKR CD45+CD14+CD64+: 0.7 %
BKR CD45+CD16+CD11B+: 42.9 %
BKR CD45+CD16.56+CD3-: 0.6 %
BKR CD45+CD33+HLADR+: 0.3 %
BKR CD45+CD34+CD10+: 0.1 %
BKR CD45+CD34+CD117+: 0.1 %
BKR CD45+CD34+CD33+: 0.1 %
BKR CD45+CD34+CD7+: 0.1 %
BKR CD45+CD56+CD38-: 0.9 %
BKR CD56+: 0 %
BKR CD56+CD38+: 3.2 %
BKR CD64+: 63.1 % — AB
BKR CD64+CD14+ MONO GATE: 55.7 %
BKR CD64+CD14+: 5.7 %
BKR CD7+CD117+: 1.1 %
BKR CD7+CD117- MONO GATE: 3.1 %
BKR CD7+CD117-: 14.9 %
BKR HLADR+CD33+: 61.7 % — ABNORMAL HIGH (ref 0–2)

## 2022-10-28 LAB — FLOW CYTOMETRY - LEUKEMIA/LYMPHOMA/NEOPLASIA, BONE MARROW

## 2022-10-28 LAB — CYTOMEGALOVIRUS QUANTITATIVE PCR, PLASMA     (BH GH L LMW YH): BKR CMV QUANTITATIVE PCR: NOT DETECTED x 1000/ÂµL — ABNORMAL HIGH (ref 2.00–7.60)

## 2022-10-29 ENCOUNTER — Encounter: Admit: 2022-10-29 | Payer: PRIVATE HEALTH INSURANCE | Attending: Vascular and Interventional Radiology

## 2022-10-30 ENCOUNTER — Ambulatory Visit: Admit: 2022-10-30 | Payer: PRIVATE HEALTH INSURANCE | Attending: Medical Oncology

## 2022-10-30 ENCOUNTER — Ambulatory Visit: Admit: 2022-10-30 | Payer: MEDICARE

## 2022-10-30 ENCOUNTER — Encounter: Admit: 2022-10-30 | Payer: PRIVATE HEALTH INSURANCE

## 2022-10-30 LAB — ENGRAFTMENT/CHIMERISM PRE DONOR     (YH)
BKR ENGRAFTMENT/CHIMERISM PRE DONOR ELECTRONIC SIGNATURE: 12125 fL — ABNORMAL HIGH (ref 80.0–100.0)
BKR ENGRAFTMENT/CHIMERISM PRE DONOR INTERPRETATION: 1.16 mg/dL — ABNORMAL LOW (ref 0.70–1.30)

## 2022-10-30 LAB — ENGRAFTMENT/CHIMERISM POST, BONE MARROW     (YH): BKR % DONOR CELLS: 100 %

## 2022-10-30 LAB — ENGRAFTMENT/CHIMERISM PRE RECIPIENT     (YH)

## 2022-10-30 NOTE — Progress Notes
 SOCIAL WORK NOTEPatient Name: Douglas Zettler BennettMedical Record Number: [id number] Date of Birth: [birth year]Medical Social Work Follow Up  [employer] Most Recent Value Admission Information  Document Type Progress Note Reason for Current Social Work Regulatory affairs officer of Information Family/Caregiver Family/Caregiver Comment wie Record Reviewed Yes Level of Care Ambulatory What medium(s) of communication were used with patient/family/caregiver? Telephone Psychosocial issues requiring intervention hotel Psychosocial interventions 10 minutes by phone with Douglas [name] who decribed a [employer] they received and paid.  It appears they might have been overcharged. I left a message for the Suit[facility] to call me regarding [phone] via [employer] and 8/29- 9/11 at the discounted rate of $40 per day. [facility] hematology Specific referrals to enhance community supports (include existing and new resources) [facility] Next Steps/Plan (including hand-off): waiting for call back from [employer]: Josefina Do, LCSW Contact Information: [phone]

## 2022-11-03 ENCOUNTER — Encounter: Admit: 2022-11-03 | Payer: PRIVATE HEALTH INSURANCE

## 2022-11-03 ENCOUNTER — Ambulatory Visit: Admit: 2022-11-03 | Payer: MEDICARE | Attending: Pharmacist Clinician (PhC)/ Clinical Pharmacy Specialist

## 2022-11-03 ENCOUNTER — Inpatient Hospital Stay: Admit: 2022-11-03 | Discharge: 2022-11-03 | Payer: MEDICARE

## 2022-11-03 ENCOUNTER — Ambulatory Visit: Admit: 2022-11-03 | Payer: MEDICARE

## 2022-11-03 DIAGNOSIS — Z9481 Bone marrow transplant status: Secondary | ICD-10-CM

## 2022-11-03 DIAGNOSIS — N400 Enlarged prostate without lower urinary tract symptoms: Secondary | ICD-10-CM

## 2022-11-03 DIAGNOSIS — C9200 Acute myeloblastic leukemia, not having achieved remission: Secondary | ICD-10-CM

## 2022-11-03 DIAGNOSIS — H409 Unspecified glaucoma: Secondary | ICD-10-CM

## 2022-11-03 DIAGNOSIS — I119 Hypertensive heart disease without heart failure: Secondary | ICD-10-CM

## 2022-11-03 DIAGNOSIS — C9110 Chronic lymphocytic leukemia of B-cell type not having achieved remission: Secondary | ICD-10-CM

## 2022-11-03 DIAGNOSIS — Z79624 Long term (current) use of inhibitors of nucleotide synthesis: Secondary | ICD-10-CM

## 2022-11-03 DIAGNOSIS — Z8249 Family history of ischemic heart disease and other diseases of the circulatory system: Secondary | ICD-10-CM

## 2022-11-03 DIAGNOSIS — I10 Essential (primary) hypertension: Secondary | ICD-10-CM

## 2022-11-03 DIAGNOSIS — Z9484 Stem cells transplant status: Secondary | ICD-10-CM

## 2022-11-03 DIAGNOSIS — R0609 Other forms of dyspnea: Secondary | ICD-10-CM

## 2022-11-03 DIAGNOSIS — C931 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: Secondary | ICD-10-CM

## 2022-11-03 DIAGNOSIS — R638 Other symptoms and signs concerning food and fluid intake: Secondary | ICD-10-CM

## 2022-11-03 DIAGNOSIS — Z79899 Other long term (current) drug therapy: Secondary | ICD-10-CM

## 2022-11-03 DIAGNOSIS — Z5181 Encounter for therapeutic drug level monitoring: Secondary | ICD-10-CM

## 2022-11-03 DIAGNOSIS — F419 Anxiety disorder, unspecified: Secondary | ICD-10-CM

## 2022-11-03 DIAGNOSIS — Z79621 Long term (current) use of calcineurin inhibitor: Secondary | ICD-10-CM

## 2022-11-03 DIAGNOSIS — E782 Mixed hyperlipidemia: Secondary | ICD-10-CM

## 2022-11-03 DIAGNOSIS — R5381 Other malaise: Secondary | ICD-10-CM

## 2022-11-03 DIAGNOSIS — F32A Depression: Secondary | ICD-10-CM

## 2022-11-03 DIAGNOSIS — R634 Abnormal weight loss: Secondary | ICD-10-CM

## 2022-11-03 DIAGNOSIS — C4491 Basal cell carcinoma of skin, unspecified: Secondary | ICD-10-CM

## 2022-11-03 LAB — COMPREHENSIVE METABOLIC PANEL
BKR A/G RATIO: 1.3 % (ref 1.0–2.2)
BKR ALANINE AMINOTRANSFERASE (ALT): 25 U/L (ref 9–59)
BKR ALBUMIN: 3.7 g/dL (ref 3.6–5.1)
BKR ALKALINE PHOSPHATASE: 91 U/L (ref 9–122)
BKR ANION GAP: 11 g/dL — ABNORMAL LOW (ref 7–17)
BKR ASPARTATE AMINOTRANSFERASE (AST): 20 U/L (ref 10–35)
BKR AST/ALT RATIO: 0.8
BKR BILIRUBIN TOTAL: 0.3 mg/dL (ref <=1.2)
BKR BLOOD UREA NITROGEN: 15 mg/dL (ref 8–23)
BKR BUN / CREAT RATIO: 13.4 % — ABNORMAL HIGH (ref 8.0–23.0)
BKR CALCIUM: 8.8 mg/dL (ref 8.8–10.2)
BKR CHLORIDE: 107 mmol/L (ref 98–107)
BKR CO2: 22 mmol/L — ABNORMAL LOW (ref 20–30)
BKR CREATININE: 1.12 mg/dL (ref 0.40–1.30)
BKR EGFR, CREATININE (CKD-EPI 2021): >60 mL/min/{1.73_m2} (ref >=60)
BKR GLOBULIN: 2.9 g/dL (ref 2.0–3.9)
BKR GLUCOSE: 128 mg/dL — ABNORMAL HIGH (ref 70–100)
BKR POTASSIUM: 4.2 mmol/L (ref 3.3–5.3)
BKR PROTEIN TOTAL: 6.6 g/dL (ref 5.9–8.3)
BKR SODIUM: 140 mmol/L (ref 136–144)

## 2022-11-03 LAB — CBC WITH AUTO DIFFERENTIAL
BKR WAM ABSOLUTE IMMATURE GRANULOCYTES.: 0.03 x 1000/ÂµL (ref 0.00–0.30)
BKR WAM ABSOLUTE LYMPHOCYTE COUNT.: 0.65 x 1000/ÂµL (ref 0.60–3.70)
BKR WAM ABSOLUTE NRBC (2 DEC): 0 x 1000/ÂµL (ref 0.00–1.00)
BKR WAM ANC (ABSOLUTE NEUTROPHIL COUNT): 3.09 x 1000/ÂµL (ref 2.00–7.60)
BKR WAM BASOPHIL ABSOLUTE COUNT.: 0.04 x 1000/ÂµL (ref 0.00–1.00)
BKR WAM BASOPHILS: 0.9 % (ref 0.0–1.4)
BKR WAM EOSINOPHIL ABSOLUTE COUNT.: 0.04 x 1000/ÂµL (ref 0.00–1.00)
BKR WAM EOSINOPHILS: 0.9 % (ref 0.0–5.0)
BKR WAM HEMATOCRIT (2 DEC): 28.9 % — ABNORMAL LOW (ref 38.50–50.00)
BKR WAM HEMOGLOBIN: 9.8 g/dL — ABNORMAL LOW (ref 13.2–17.1)
BKR WAM IMMATURE GRANULOCYTES: 0.7 % (ref 0.0–1.0)
BKR WAM LYMPHOCYTES: 14.5 % — ABNORMAL LOW (ref 17.0–50.0)
BKR WAM MCH (PG): 32.6 pg (ref 27.0–33.0)
BKR WAM MCHC: 33.9 g/dL (ref 31.0–36.0)
BKR WAM MCV: 96 fL (ref 80.0–100.0)
BKR WAM MONOCYTE ABSOLUTE COUNT.: 0.62 x 1000/ÂµL (ref 0.00–1.00)
BKR WAM MONOCYTES: 13.9 % — ABNORMAL HIGH (ref 4.0–12.0)
BKR WAM MPV: 10.4 fL (ref 8.0–12.0)
BKR WAM NEUTROPHILS: 69.1 % (ref 39.0–72.0)
BKR WAM NUCLEATED RED BLOOD CELLS: 0 % (ref 0.0–1.0)
BKR WAM PLATELETS: 191 x1000/ÂµL (ref 150–420)
BKR WAM RDW-CV: 22.6 % — ABNORMAL HIGH (ref 11.0–15.0)
BKR WAM RED BLOOD CELL COUNT.: 3.01 M/ÂµL — ABNORMAL LOW (ref 4.00–6.00)
BKR WAM WHITE BLOOD CELL COUNT: 4.5 x1000/ÂµL (ref 4.0–11.0)

## 2022-11-03 LAB — TACROLIMUS LEVEL     (BH GH L LMW YH): BKR TACROLIMUS BLOOD: 9 ng/mL

## 2022-11-03 LAB — RETICULOCYTES
BKR WAM IRF: 12.8 % (ref 3.0–15.9)
BKR WAM RETICULOCYTE - ABS (3 DEC): 0.076 10Ë6 cells/uL (ref 0.023–0.140)
BKR WAM RETICULOCYTE COUNT PCT (1 DEC): 2.5 % (ref 0.6–2.7)
BKR WAM RETICULOCYTE HGB EQUIVALENT: 39.9 pg — ABNORMAL HIGH (ref 28.2–35.7)

## 2022-11-03 LAB — MAGNESIUM: BKR MAGNESIUM: 1.5 mg/dL — ABNORMAL LOW (ref 1.7–2.4)

## 2022-11-03 LAB — PHOSPHORUS     (BH GH L LMW YH): BKR PHOSPHORUS: 2.3 mg/dL (ref 2.2–4.5)

## 2022-11-03 LAB — LACTATE DEHYDROGENASE: BKR LACTATE DEHYDROGENASE: 253 U/L — ABNORMAL HIGH (ref 122–241)

## 2022-11-03 LAB — TRIGLYCERIDES: BKR TRIGLYCERIDES: 187 mg/dL — ABNORMAL HIGH

## 2022-11-03 LAB — PT/INR AND PTT (BH GH L LMW YH)
BKR INR: 1.08 % (ref 0.86–1.12)
BKR PARTIAL THROMBOPLASTIN TIME: 26.4 seconds — ABNORMAL HIGH (ref 23.0–31.4)
BKR PROTHROMBIN TIME: 11.9 seconds (ref 9.6–12.3)

## 2022-11-03 LAB — HAPTOGLOBIN: BKR HAPTOGLOBIN: 181 mg/dL (ref 30–200)

## 2022-11-03 MED ORDER — MAGNESIUM SULFATE 2 GRAM/50 ML (4 %) IN WATER INTRAVENOUS PIGGYBACK
2 | Freq: Once | INTRAVENOUS | Status: CP
Start: 2022-11-03 — End: ?
  Administered 2022-11-03: 15:00:00 2 mL/h via INTRAVENOUS

## 2022-11-03 NOTE — Progress Notes
 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 He continues to improve. He has been  getting PT and now only using his cane.. No chest pain, heart palpitations, cough or shortness of breath. Some nausea, no vomiting. Eating 50% of his normal, getting better. No diarrhea or constipation. Voiding without difficulty. No rash, fever or chills. Review of Systems Constitutional:  Positive for malaise/fatigue. Negative for chills, fever and weight loss. HENT: Negative.  Negative for sore throat.  Eyes: Negative.  Respiratory: Negative.  Negative for cough, sputum production and shortness of breath.  Cardiovascular:  Negative for chest pain, palpitations and leg swelling. Gastrointestinal:  Negative for abdominal pain, constipation, diarrhea, nausea and vomiting.      Eating better, estimates 50% Genitourinary: Negative.  Negative for dysuria. Musculoskeletal: Negative.       Working with PT - ambulating now with cane instead of walker Skin: Negative.  Negative for itching and rash. Neurological:  Positive for tremors (Shakiness of hands.). Negative for dizziness, weakness and headaches. Psychiatric/Behavioral: Negative.   VoriconazoleCurrent Outpatient Medications on File Prior to Visit Medication Sig Dispense Refill  acyclovir (ZOVIRAX) 400 mg tablet Take 1 tablet (400 mg total) by mouth 2 (two) times daily. 60 tablet 11  amLODIPine (NORVASC) 10 mg tablet Take 1 tablet (10 mg total) by mouth daily.    finasteride (PROSCAR) 5 mg tablet TAKE 1 TABLET BY MOUTH DAILY FOR ENLARGED PROSTATE WITH URINATION PROBLEM TAKE WITH OR WITHOUT MEALS    folic acid (FOLVITE) 1 mg tablet Take 1 tablet (1 mg total) by mouth daily. 30 tablet 11  isavuconazonium (CRESEMBA) 186 mg capsule Take 2 capsules (372 mg total) by mouth daily. 60 capsule 0  latanoprost (XALATAN) 0.005 % ophthalmic solution Place 1 drop into both eyes nightly.    magnesium oxide 400 mg magnesium Cap Take 400 mg by mouth daily.    Miscellaneous Medical Supply Please provide rolling walker, Use as directed. 1 each 0  multivitamin tablet Take 1 tablet by mouth daily. 30 tablet 11  ondansetron (ZOFRAN) 8 mg tablet Take 1 tablet (8 mg total) by mouth every 8 (eight) hours as needed for nausea or vomiting. 30 tablet 2  pentamidine (PENTAM) 300 mg inhalation solution Inhale 6 mLs (300 mg total) into the lungs every 28 days. Last given 10/16/22    prochlorperazine (COMPAZINE) 10 mg tablet Take 1 tablet (10 mg total) by mouth every 6 (six) hours as needed for nausea. 30 tablet 3  sodium chloride 0.9 % flush Use 10 mLs by IV Push route daily. In both lumens 600 mL 11  tacrolimus (PROGRAF) 0.5 mg Immediate Release capsule Take 1 capsule (0.5 mg total) by mouth 2 (two) times daily. (Patient taking differently: Take 1 capsule (0.5 mg total) by mouth 2 (two) times daily. Total dose: 6.5  mg BID) 60 capsule 11  tacrolimus (PROGRAF) 1 mg immediate release capsule Take 1 capsule (1 mg total) by mouth 2 (two) times daily. (Patient taking differently: Take 1 capsule (1 mg total) by mouth 2 (two) times daily. Total dose: 6.5mg  BID) 60 capsule 11  tacrolimus (PROGRAF) 5 mg Immediate Release capsule Take 1 capsule (5 mg total) by mouth 2 (two) times daily. (Patient taking differently: Take 1 capsule (5 mg  total) by mouth 2 (two) times daily. Total dose: 6.5mg  BID) 60 capsule 11  ursodioL (URSO FORTE) 500 mg tablet Take 1 tablet (500 mg total) by mouth 2 times daily (0900, 1700). 60 tablet 6  [DISCONTINUED] venetoclax (VENCLEXTA) 100 mg tablet Take 4 tablets (400 mg total) by mouth once daily. Take with food. Swallow whole, do not crush. 120 tablet 0 Current Facility-Administered Medications on File Prior to Visit Medication Dose Route Frequency Provider Last Rate Last Admin  [COMPLETED] magnesium sulfate in water 2 gram/50 mL (4 %) (IVPB) 2 g  2 g Intravenous Once [name], APRN   Stopped at 11/03/22 1120 BP: 136/78 (11/03/2022  9:27 AM), Pulse: (!) 93 (11/03/2022  9:27 AM), Temp: 97.8 ?F (36.6 ?C) (11/03/2022  9:27 AM), Resp: 18 (11/03/2022  9:27 AM), SpO2: 100 % (11/03/2022  9:27 AM), Pain Score: Zero (11/03/2022  9:26 AM), Weight: 97.3 kg (11/03/2022  9:26 AM), Weight (lbs): 214.51 (11/03/2022  9:26 AM)Wt Readings from Last 3 Encounters: 11/03/22 97.3 kg 10/27/22 98.3 kg 10/23/22 98.3 kg Physical ExamConstitutional:     Appearance: Normal appearance. He is not ill-appearing. HENT:    Head: Normocephalic.    Nose: Nose normal. No congestion.    Mouth/Throat:    Mouth: Mucous membranes are moist.    Pharynx: Oropharynx is clear. Eyes:    General: No scleral icterus.      Right eye: No discharge.       Left eye: No discharge. Cardiovascular:    Rate and Rhythm: Normal rate and regular rhythm.    Pulses: Normal pulses.    Heart sounds: Normal heart sounds. No murmur heard.Pulmonary:    Effort: Pulmonary effort is normal. No respiratory distress.    Breath sounds: Normal breath sounds. No wheezing or rales. Abdominal:    General: Abdomen is flat. Bowel sounds are normal. There is no distension.    Palpations: Abdomen is soft.    Tenderness: There is no abdominal tenderness. There is no guarding. Musculoskeletal:       General: No swelling.    Right lower leg: No edema.    Left lower leg: No edema. Skin:   General: Skin is warm.    Findings: No erythema or rash. Neurological:    Mental Status: He is alert and oriented to person, place, and time. Psychiatric:       Behavior: Behavior normal. Lab Results Component Value Date/Time  WBC 4.5 11/03/2022 09:45 AM  HGB 9.8 (L) 11/03/2022 09:45 AM  HCT 28.90 (L) 11/03/2022 09:45 AM  PLT 191 11/03/2022 09:45 AM  Lab Results Component Value Date/Time  NEUTROPHILS 69.1 11/03/2022 09:45 AM  Lab Results Component Value Date/Time  NA 140 11/03/2022 09:45 AM  K 4.2 11/03/2022 09:45 AM  CL 107 11/03/2022 09:45 AM  CO2 22 11/03/2022 09:45 AM  BUN 15 11/03/2022 09:45 AM  CREATININE 1.12 11/03/2022 09:45 AM  GLU 128 (H) 11/03/2022 09:45 AM  ANIONGAP 11 11/03/2022 09:45 AM  Lab Results Component Value Date/Time  CALCIUM 8.8 11/03/2022 09:45 AM  MG 1.5 (L) 11/03/2022 09:45 AM  PHOS 2.3 11/03/2022 09:45 AM  Lab Results Component Value Date/Time  ALT 25 11/03/2022 09:45 AM  AST 20 11/03/2022 09:45 AM  ALKPHOS 91 11/03/2022 09:45 AM  BILITOT 0.3 11/03/2022 09:45 AM  BILIDIR 0.2 09/11/2022 12:31 PM  ALBUMIN 3.7 11/03/2022 09:45 AM  Lab Results Component Value Date/Time  PTT 26.4 11/03/2022 09:45 AM  LABPROT 11.9 11/03/2022 09:45 AM  INR 1.08 11/03/2022 09:45 AM  Results  in Past 7 DaysResult Component Current Result LD 253 (H) (11/03/2022) No results found for: RAPAMYCINResults in Past 7 DaysResult Component Current Result Tacrolimus Lvl 9.0 (11/03/2022) Assessment:Toma W Wydra is a 72 y.o. 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 He is s/p 2 cycles of chemotherapy with Decitabine + Venetoclax with reduction in blast count. 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 He is engrafted on day +13 and is discharged on 10/10/22. Plan: Day + 40. No s/sx of GVHD. Tac therapeutic.  BMBx: NED; Chimerism 100%; CCS heme panel and Cytogenetics pending. 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  Therapeutic goal of 6-11ng/ml.  Level 9 ng/ml.  Continue current dosing.No MMF with 10/10Monitor Triglycerides, Haptoglobin and LDH weekly on Tacrolimus (250/166/381)CV: HTN: Norvasc to 10 mg po dailyVOD Prophylaxis: No s/sxContinue Actigall 500mg  PO BID until day + weight, LFT's and coags frequentlyFluids, Electrolytes and Nutrition:Replete electrolytes PRNDiet - safe food handlingDaily MTV, Folic Acid and MagnesiumCVAD: [facility] for chemotherapy and supportive carePost Transplant immune reconstitution/vaccines: Date   Day   T cell subset   IGG   ~100     ~180     ~1 year   Plan on re-vaccinating at 1 year unless remain on immunosuppression. Vaccines : 12, 14 , 16 and 24 months s/p transplantHealth Maintenance Day +30 Day +100 Day +180 1 Year Heme/Immune       T cell subsets/IGG       Chimerism/Bone Marrow or PET/Imaging [x]  [] []  [] []  [] []  Pulmonary       PFT's  []  []  []  Cardiology       Echo       EKG   []  Only if cardiac risk factors  [] []  [] []  Endocrine       TSH/TFT's       Fasting Lipids       Glucose/A1c       Vit D/Calcium        Bone Density (women and those on steroids)Reproductive Endocrine        FSH/LH/Testosterone (both men & women)   []  [] [] [] [] [] [] []  Liver      Ferritin      MRI T2* quant (if ferritin abnormal)    [] []  Ophthalmology       Sicca/Cataracts   []  []  Dermatology/musculo-skeletal        GVHD (skin & ROM)        Cancer screening (Derm referral)            [] []  [] []  Dental       GVHD       Routine Care   []  []              Majhail, et al. (2012). Recommended Screening and Preventive Practices for Long-Term Survivors after  Hematopoietic Cell Transplantation. Biology of Blood and Marrow Transplantation, Volume 18,                    Issue 3, March 2012, Pages 348-371.Conditioning/Transplant:-Melphalan 70 mg/m2 over 30 minutes on D-6, *Melphalan dosed reduced to 70 mg/m2 due to age-Fludarabine 40 mg/m2 over 30 minutes every 24hr for 4 doses from D-5 to D -2             -Stem cell infusion day 0 (09/24/22)Follow up on 9/20 with Dr. Wyona Almas  PA-C

## 2022-11-03 NOTE — Other
 Post-HSCT Follow-Up Pharmacist note Diagnosis: CMMLConditioning:   Fludarabine/Melphalan with post-transplant Cyclophosphamide Transplant Type:10/10 MUD Allogeneic PBSCTTransplant Date (Day 0): 09/24/22 Referring Provider: Chrystine Oiler, MDEncounter Date: 11/03/2022 = D+40[name] is a 72 y.o. year-old male with history of CMML s/p HSCT. Patient is being seen by the clinic pharmacist for medication management included post stem cell transplant, hematological related disorders and GI related disorders .  This visit was completed as a joint-visit with provider present and is aware of the care plan.Subjective: No need signs of skin rash, diarrhea.  Held tacrolimus prior to morning labs Assessment and Recommendations:  Summary of Medication Changes and Follow-up Items Discussed with Provider:Tacrolimus 9 - continue current doseNeeds refills on Friday, will send email to OPS thenGVHD (immunosuppression)Tacrolimus: Current dose: 6.5 mg PO twice daily09/17/24  level: 9 -  continue current doseGoal range: 6-11 ng/mLDosage dispensed: 0.5mg , 1mg , 5mg  capsulesPharmacy dispensed: OPSPost transplant cyclophosphamide: completed inpatient *All immunosuppression is managed by the bone marrow transplant team.Infectious Disease (prophylaxis, monitoring, and active infections)ViralCurrent prophylaxis/treatment: Acyclovir 400mg  PO twice dailyPrevious viral infections: 	HSV/VZV +/?CMV -/?, 9/10 CMV negativeEBV IgG negativeBK virus N/APneumocystis jeuvecii pneumoniaCurrent prophylaxis/treatment: Pentamidine 300mg  inhaled monthly last given 8/30/24Previous PJP infection: N/AT-cell subsetDay +100 (date):Day +180 (date):1 year (date):Toxoplasmosis IgG negativeCurrent prophylaxis/treatment: NoneFungal/MoldCurrent prophylaxis/treatment: Isavuconazole 372mg  PO daily - history of prolonged cytopenias Previous fungal/mold infection: N/A, Hx of gingival changes to voriconazole Drug monitoring: N/AGalactomann - 10/01/22 negFungitell - 10/01/22 < 31Bacterial	Current prophylaxis/treatment: None	Previous bacterial infection: N/A	Drug monitoring: N/A	Blood cultures: N/ASupportive careHyperlipidemia monitoring (triglycerides, Cholesterol, LDL, HDL)Current treatment: None	Prior treatment: Atorvastatin 40mg  PO daily, held with transplantBaseline (09/18/2022): 68 Today (11/03/22): 187Hypertension monitoring (blood pressure)Current treatment: Amlodipine 10mg  PO dailyPrior treatment: Metoprolol and lisinopril on hold since transplant Today (11/03/22):  136/78GastrointestionalStress ulcer prophylaxis: None, one episode of acid refluxNausea/Vomiting: Intermittent nausea, takes ondansetron with good effect but prefers prochlorperazine, Diarrhea/constipation: Continues to have soft stools, loperamidine prnOral care and mucositis: NoneVeno-occlusive diseaseCurrent treatment:  Ursodiol 500mg  PO twice daily until day +90Anticoagulation	Current prophylaxis/treatment: None	Previous anticoagulation history: Aspirin 81mg  PO daily, for cardiac ppx - not restartingElectrolyte supplementation:	Current treatment: Magnesium oxide 400mg  BID	Monitoring: Mg 1.5 09/17/24Hypogammaglobulinemia	Current treatment:	Goal: 500-800 ng/dLIVIG last dose:Baseline IgG level:Last IgG level:IgG scheduled levelsDay +100 (date):Day +180 (date):	1 year (date):	Maintenance Therapy Plan: VaccinationsBaseline titers: 	Hepatitis B: 	Pneumococcal: 	VZV 	Measles: , Mumps: , Rubella: 12 month: 14 month: 18 month:	Hep B titer	Pneumococcal titers24 month:Titers drawn 2 months after 24 months:Redose needed:Medication History:Douglas [name] presents to today's visit with their med action plan.  A medication history was obtained from the patient and spouse.Adherence:The patient reports 0 missed doses of medications in the past 1 week(s). The patient does not report the barriers to adherence. Discussed potential outcomes associated with non-adherenceAffordability:The patient denies concerns with affordability of medications at this time. Medication Management:Plan for medication management assistance: An updated medication action plan was given to the patient, no changesPatient would benefit from continued pharmacy follow-up at future clinic visits for medication managementEducated patient on the above changesPatient did not request refills todayObjective: ZOX:[id number] Results (from the past 168 hour(s)) CBC auto differential  Collection Time: 11/03/22  9:45 AM Result Value Ref Range  WBC 4.5 4.0 - 11.0 x1000/?L  RBC 3.01 (L) 4.00 - 6.00 M/?L  Hemoglobin 9.8 (L) 13.2 - 17.1 g/dL  Hematocrit 40.98 (L) 11.91 - 50.00 %  MCV 96.0 80.0 - 100.0 fL  MCH 32.6 27.0 - 33.0 pg  MCHC 33.9 31.0 - 36.0 g/dL  RDW-CV 47.8 (H) 29.5 - 15.0 %  Platelets 191 150 - 420 x1000/?L  MPV 10.4 8.0 -  12.0 fL  Neutrophils 69.1 39.0 - 72.0 %  Lymphocytes 14.5 (L) 17.0 - 50.0 %  Monocytes 13.9 (H) 4.0 - 12.0 %  Eosinophils 0.9 0.0 - 5.0 %  Basophil 0.9 0.0 - 1.4 %  Immature Granulocytes 0.7 0.0 - 1.0 %  nRBC 0.0 0.0 - 1.0 %  Absolute Lymphocyte Count 0.65 0.60 - 3.70 x 1000/?L  Monocyte Absolute Count 0.62 0.00 - 1.00 x 1000/?L  Eosinophil Absolute Count 0.04 0.00 - 1.00 x 1000/?L  Basophil Absolute Count 0.04 0.00 - 1.00 x 1000/?L  Absolute Immature Granulocyte Count 0.03 0.00 - 0.30 x 1000/?L  Absolute nRBC 0.00 0.00 - 1.00 x 1000/?L  ANC (Abs Neutrophil Count) 3.09 2.00 - 7.60 x 1000/?L [id number] Results (from the past 168 hour(s)) Comprehensive metabolic panel  Collection Time: 11/03/22  9:45 AM Result Value Ref Range  Sodium 140 136 - 144 mmol/L  Potassium 4.2 3.3 - 5.3 mmol/L  Chloride 107 98 - 107 mmol/L  CO2 22 20 - 30 mmol/L  Anion Gap 11 7 - 17  Glucose 128 (H) 70 - 100 mg/dL  BUN 15 8 - 23 mg/dL  Creatinine 3.87 5.64 - 1.30 mg/dL  Calcium 8.8 8.8 - 33.2 mg/dL  BUN/Creatinine Ratio 95.1 8.0 - 23.0  Total Protein 6.6 5.9 - 8.3 g/dL  Albumin 3.7 3.6 - 5.1 g/dL  Total Bilirubin 0.3 <=8.8 mg/dL  Alkaline Phosphatase 91 9 - 122 U/L  Alanine Aminotransferase (ALT) 25 9 - 59 U/L  Aspartate Aminotransferase (AST) 20 10 - 35 U/L  Globulin 2.9 2.0 - 3.9 g/dL  A/G Ratio 1.3 1.0 - 2.2  AST/ALT Ratio 0.8 Reference Range Not Established  eGFR (Creatinine) >60 >=60 mL/min/1.22m2 LDH: Recent Results (from the past 168 hour(s)) Lactate dehydrogenase  Collection Time: 11/03/22  9:45 AM Result Value Ref Range  LD 253 (H) 122 - 241 U/L Patient Education: The following education was provided for: Initiation, modification, or discontinuation of drug therapy, Assessment of efficacy and safety of drug therapy, and Adverse effect presentation, evaluation, and monitoring during today?s visit to the patient and spouse regarding medication management: purpose of each medication, side effects of medications, and importance of adherence with the regimen(s) Time Spent: 15 minutes, face-to-face consultPATIENT AND/OR CAREGIVER VERBALIZED UNDERSTANDING OF CARE PLAN: yesPATIENT ADVISED TO CALL BACK WITH QUESTIONS, CONCERNS, OR CHANGE IN SYMPTOMS.Signed by: Jorene Guest, PharmD BCPS BCOP09/17/24 11:19 AM

## 2022-11-03 NOTE — Progress Notes
 Day + 39 Allo/AML. VSS. See focused reassessment. He offers no new c/o. He saw [employer] today. He received magnesium 2 gm IV. He returns to [facility] on 9/24.

## 2022-11-04 ENCOUNTER — Encounter
Admit: 2022-11-04 | Payer: PRIVATE HEALTH INSURANCE | Attending: Student in an Organized Health Care Education/Training Program

## 2022-11-04 DIAGNOSIS — C9200 Acute myeloblastic leukemia, not having achieved remission: Secondary | ICD-10-CM

## 2022-11-04 LAB — CYTOMEGALOVIRUS QUANTITATIVE PCR, PLASMA     (BH GH L LMW YH): BKR CMV QUANTITATIVE PCR: NOT DETECTED %

## 2022-11-04 MED FILL — FOLIC ACID 1 MG TABLET: 1 mg | ORAL | 30 days supply | Qty: 30 | Fill #1 | Status: CP

## 2022-11-04 MED FILL — TACROLIMUS IMMEDIATE RELEASE 0.5 MG CAPSULE: 0.5 mg | ORAL | 30 days supply | Qty: 60 | Fill #1 | Status: CP

## 2022-11-04 MED FILL — TACROLIMUS IMMEDIATE RELEASE 5 MG CAPSULE: 5 mg | ORAL | 30 days supply | Qty: 60 | Fill #1 | Status: CP

## 2022-11-04 MED FILL — URSODIOL 500 MG TABLET: 500 mg | ORAL | 30 days supply | Qty: 60 | Fill #1 | Status: CP

## 2022-11-04 MED FILL — TACROLIMUS IMMEDIATE RELEASE 1 MG CAPSULE: 1 mg | ORAL | 30 days supply | Qty: 60 | Fill #1 | Status: CP

## 2022-11-05 ENCOUNTER — Encounter
Admit: 2022-11-05 | Payer: PRIVATE HEALTH INSURANCE | Attending: Student in an Organized Health Care Education/Training Program

## 2022-11-05 DIAGNOSIS — C9200 Acute myeloblastic leukemia, not having achieved remission: Secondary | ICD-10-CM

## 2022-11-05 NOTE — Discharge Summary
 [facility] Cellular TherapyPatient Data:  Patient Name: Douglas [name] Admit date: 09/18/2022 Age: 72 y.o. Discharge date: 10/10/22 DOB: January 18, 1951	 Discharge Attending Physician: Diego Cory MRN: [id number]	 Discharged Condition: good PCP: No primary care provider on file. Disposition: Home Home with Homecare Services Principal Diagnosis: AML - Allogeneic stem cell transplantSecondary Diagnoses: neutropenic fever and follicularePost Discharge Follow-up: Issues to be Addressed Post Discharge:Restarting ASA and LisinoprilNeeds more follow up appointemntsFollow-up Information:Future Appointments Date Time Provider Department Center 10/13/2022 11:00 AM [facility] [place] [facility]AIR 1 SMIL HEM DAY [facility] 10/13/2022 11:30 AM [facility] BMT-CAR T SMIL BMT CAR [facility] CAD 10/16/2022  9:30 AM [facility] [place] BMT DAY HOSP - CHAIR 6 SMIL HEM DAY [facility] CAD 10/16/2022 10:10 AM Chrystine Oiler, MD SMIL BMT CAR [facility] CAD Hospital Course: Douglas [name] is a 72 y.o. 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 He is s/p 2 cycles of chemotherapy with Decitabine + Venetoclax with reduction in blast count. 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 He is engrafted on day +13 and is discharged on 10/10/22. Course was complicated by neutropenic fever and folliculare. He was conditioned with Melphalan 70 mg/m 2D-6 (Melphalan dosed reduced to 70 mg/m2 due to age) and Fludarabine 40 mg/m2  every 24hr for 4 doses from D-5 to D -2. Stem cell infusion day 0 (09/24/22) - tolerated well. Pt was transfused to maintain Hgb>7 and plts > 10 or if clinically indicated. He received 4 pRBC transfusion and 11 transfusion of platelets. Filgrastim 65mcg/kg started on day +5 and discontinued on day+13 with stable engraftment of neutrophils. On admission he was started on prophylactic antimicrobials of ACV, Crescemba  and Cipro. FN on 09/27/22, cipro DC'd and started on broad spectrum abx of zosyn and vanco, MRSA neg, vanco DC'd. Continued zoysn through engraftment. Pentam given on admission and will be repeated every 3-4 weeks outpatient. GVHD prophylaxis with PT cyclophosphamide 25 mg/kg IV over 1.5 hrs every 24hr for 2 doses on D +3 & D +4 (reduced by 50% to 25 mg/kg due 10/10 match and age)and [place] 50 mg/kg CIVI daily, for 2 doses, starting 15 minutes before cyclophosphamide on D +3 & D +4. Tacrolimus started on day +5. Dose at time of discharge is 6.5mg  PO BID with therapeutic goal of 6-11ng/ml. No s/sx of GVHD at time of discharge.  VOD prophylaxis with Ursodiol 500mg  PO BID, continue until minimum Day +90. Monitored weight and LFTs. Had been holding his home HTN medication prior to admission, restarted while hospitalized, currently on Norvasc 10 mg daily. 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 Improving and orehead erythema and left eyelid erythema and swelling with purpura on eyelid - improving so will hold off on bx per derm, felt to be hordeolum, however, given pt counts will continue empiric tx for cellulitis, improving. Patient discharged delayed with concern of COVID in the home. His wife has had two negative test and she will take him to the [facility] for a few days. He will have home care and PT. He has a RW that was delivered to the bedside. Objective: Recent Labs Lab 08/22/240557 08/23/240605 08/24/240544 WBC 7.2 5.3 3.6* HGB 8.0* 7.9* 8.0* HCT 24.10* 23.60*  23.90* PLT 30* 33* 32*  Recent Labs Lab 08/23/240605 NEUTROPHILS 52.4  Recent Labs Lab 08/22/240557 08/23/240605 08/24/240544 NA 142 141 140 K 4.1 3.8 3.7 CL 110* 109* 108* CO2 19* 21 22 BUN 12 11 10  CREATININE 0.84 0.83 0.85 GLU 88 98 100 ANIONGAP 13 11 10   Recent Labs Lab 08/22/240557 08/23/240605 08/24/240544 CALCIUM 8.7* 8.7* 8.8 MG 1.6*  --   --   Recent Labs Lab 08/22/240557 08/23/240605 08/24/240544 ALT 18 21 15  AST 29 29 23  ALKPHOS 118 101 95 BILITOT 0.4 0.4 0.4  Recent Labs Lab 08/23/241724 PTT 27.9 LABPROT 13.7* INR 1.25*  Imaging: Imaging results last 24h: No results found.Imaging results last 1 week:  No results found.Physical Exam Discharge vitals: Temp:  [97.5 ?F (36.4 ?C)-98.6 ?F (37 ?C)] 97.8 ?F (36.6 ?C)Pulse:  [74-81] 77Resp:  [18-20] 19BP: (124-132)/(68-72) 125/68SpO2:  [96 %-98 %] 97 %Device (Oxygen Therapy): room air Cognitive Status at Discharge: BaselineDischarge Physical Exam:Physical ExamAllergies Allergies Allergen Reactions  Voriconazole Other (See Comments)   Gingival edema/redness making it difficult to bite down   PMH PSH Past Medical History: Diagnosis Date  Anxiety   Basal cell carcinoma   Benign prostatic hyperplasia without lower urinary tract symptoms 08/29/2015  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 (HC Code)   CLL (chronic lymphocytic leukemia) (HC Code)   Depression   DOE (dyspnea on exertion)   Enlarged prostate   Essential hypertension   Family history of ischemic heart disease   Glaucoma   Hypertensive heart disease without heart failure   Mixed hyperlipidemia   Past Surgical History: Procedure Laterality Date  ADENOIDECTOMY    BONE MARROW BIOPSY    PROSTATE BIOPSY    TONSILLECTOMY    VASCULAR SURGERY    Social History Family History Social History Tobacco Use  Smoking status: Never  Smokeless tobacco: Never Substance Use Topics  Alcohol use: Yes   Alcohol/week: 2.0 standard drinks of alcohol   Types: 2 Cans of beer per week Comment: social drinker once a week  Family History Problem Relation Age of Onset  Breast cancer Mother   Liver cancer Mother   Heart disease Father   Prostate cancer Father   Breast cancer Sister   Thyroid disease Sister   Hearing loss Sister   Diabetes Daughter   No Known Problems Daughter   No Known Problems Grandchild   Discharge Medications: Discharge: Current Discharge Medication List  START taking these medications  Details folic acid (FOLVITE) 1 mg tablet Take 1 tablet (1 mg total) by mouth daily.Qty: 30 tablet, Refills: 11Start date: 10/07/2022  Miscellaneous Medical Supply Please provide rolling walker, Use as directed.Qty: 1 each, Refills: 0Start date: 10/09/2022  multivitamin tablet Take 1 tablet by mouth daily.Qty: 30 tablet, Refills: 11Start date: 10/01/2022, End date: 10/01/2023  Associated Diagnoses: H/O peripheral stem cell transplant (HC Code)  ondansetron (ZOFRAN) 8 mg tablet Take 1 tablet (8 mg total) by mouth every 8 (eight) hours as needed for nausea or vomiting.Qty: 20 tablet, Refills: 2Start date: 10/01/2022  Associated Diagnoses: H/O peripheral stem cell transplant (HC Code)  sodium chloride 0.9 % flush Use 10 mLs by IV Push route daily. In both lumensQty: 600 mL, Refills: 11Start date: 10/01/2022  Associated Diagnoses: H/O peripheral stem cell transplant (HC Code)  !! tacrolimus (PROGRAF) 0.5 mg Immediate Release capsule Take 1 capsule (0.5 mg total) by mouth 2 (two) times daily.Qty: 60 capsule, Refills: 11Start date: 10/01/2022  Associated Diagnoses: H/O peripheral stem cell transplant (HC Code)  !! tacrolimus (PROGRAF) 1  mg immediate release capsule Take 1 capsule (1 mg total) by mouth 2 (two) times daily.Qty: 60 capsule, Refills: 11Start date: 10/01/2022  Associated Diagnoses: H/O peripheral stem cell transplant (HC Code)  !! tacrolimus (PROGRAF) 5 mg Immediate Release capsule Take 1 capsule (5 mg total) by mouth 2 (two) times daily.Qty: 60 capsule, Refills: 11Start date: 10/01/2022  Associated Diagnoses: H/O peripheral stem cell transplant (HC Code)  ursodioL (URSO FORTE) 500 mg tablet Take 1 tablet (500 mg total) by mouth 2 times daily (0900, 1700).Qty: 60 tablet, Refills: 6Start date: 10/01/2022  Associated Diagnoses: H/O peripheral stem cell transplant (HC Code)   !! - Potential duplicate medications found. Please discuss with provider.  CONTINUE these medications which have CHANGED  Details acyclovir (ZOVIRAX) 400 mg tablet Take 1 tablet (400 mg total) by mouth 2 (two) times daily.Qty: 60 tablet, Refills: 11Start date: 10/01/2022  Associated Diagnoses: Acute myeloid leukemia not having achieved remission (HC Code); H/O peripheral stem cell transplant (HC Code)  !! magnesium oxide (MAG-OX) 400 mg (241.3 mg magnesium) tablet Take 1 tablet (400 mg total) by mouth 2 (two) times daily.Qty: 60 tablet, Refills: 11Start date: 10/01/2022  Associated Diagnoses: H/O peripheral stem cell transplant (HC Code)  !! magnesium oxide (MAG-OX) 400 mg (241.3 mg magnesium) tablet Take by mouth 2 (two) times daily.Qty: 30 tabletStart date: 10/10/2022   !! - Potential duplicate medications found. Please discuss with provider.  CONTINUE these medications which have NOT CHANGED  Details finasteride (PROSCAR) 5 mg tablet TAKE 1 TABLET BY MOUTH DAILY FOR ENLARGED PROSTATE WITH URINATION PROBLEM TAKE WITH OR WITHOUT MEALS  latanoprost (XALATAN) 0.005 % ophthalmic solution Place 1 drop into both eyes nightly.  amLODIPine (NORVASC) 10 mg tablet Take 1 tablet (10 mg total) by mouth daily.  isavuconazonium (CRESEMBA) 186 mg capsule Take 2 capsules (372 mg total) by mouth daily.Qty: 60 capsule, Refills: 0  Associated Diagnoses: Acute myeloid leukemia not having achieved remission (HC Code)   STOP taking these medications chlorhexidine gluconate (PERIDEX) 0.12 % solution    dexAMETHasone (DECADRON) 0.5 mg/5 mL solution    levoFLOXacin (LEVAQUIN) 500 mg tablet    lisinopriL (PRINIVIL,ZESTRIL) 40 mg tablet    metoprolol succinate XL (TOPROL-XL) 50 mg 24 hr tablet    anidulafungin (ERAXIS) 100 mg in sodium chloride 0.9% 100 mL (Total Volume 130 mL) IVPB    aspirin 81 mg EC delayed release tablet    atorvastatin (LIPITOR) 40 mg tablet    cholecalciferol (VITAMIN D-3) 50 mcg (2,000 unit) capsule    coenzyme Q10 300 mg Cap    LIDOCAINE 2 % solution    ondansetron (ZOFRAN-ODT) 8 mg disintegrating tablet    voriconazole (VFEND) 200 mg tablet    I met with patient to review discharge teaching and guidelines after bone marrow transplant.  I reviewed in detail but not limited to: when to call: fever greater than 100.73F, nausea vomiting, diarrhea, rash, chills or any concerns about medical care. I confirmed all prescriptions were correctly filled by discharge medication review in EPIC.  APP reviewed all medications, indications for medications and schedule. Patient given printed medication schedule. Confirmed with nursing patient  is able to care for central line and will have visiting RN. Line will also be cared for in clinic . Patient appears prepared for discharge and will follow up in clinic as scheduled. 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

## 2022-11-06 ENCOUNTER — Ambulatory Visit: Admit: 2022-11-06 | Payer: MEDICARE

## 2022-11-06 ENCOUNTER — Ambulatory Visit: Admit: 2022-11-06 | Payer: MEDICARE | Attending: Medical Oncology

## 2022-11-06 ENCOUNTER — Telehealth: Admit: 2022-11-06 | Payer: PRIVATE HEALTH INSURANCE | Attending: Hematology

## 2022-11-06 MED ORDER — CRESEMBA 186 MG CAPSULE
186 mg | ORAL_CAPSULE | ORAL | 12 refills | 28.00 days | Status: AC
Start: 2022-11-06 — End: ?
  Filled 2022-11-07: qty 56, 28d supply, fill #0

## 2022-11-06 NOTE — Telephone Encounter
 Patient calling in for a medication refill on Cresemba. Pt uses [facility] He only has enough until tomorrow night. Thanks

## 2022-11-09 ENCOUNTER — Encounter: Admit: 2022-11-09 | Payer: PRIVATE HEALTH INSURANCE

## 2022-11-09 NOTE — Progress Notes
 SOCIAL WORK NOTEPatient Name: Hansel Degarmo BennettMedical Record Number: [id number] Date of Birth: 04/02/1952Medical Social Work Follow Up  [employer] Most Recent Value Admission Information  Document Type Progress Note Reason for Current Social Work Involvement Resources Source of Information Patient Record Reviewed Yes Level of Care Ambulatory What medium(s) of communication were used with patient/family/caregiver? Telephone Psychosocial issues requiring intervention [facility] Psychosocial interventions 5 minutes by phone with Mr. Trezise regardigng discrepancy in [place] bill. I advised him I will look into it and get back to him. Collaborations I called the [facility] and was told nothing could be done to change billing now that he has left the Suit[facility]. Specific referrals to enhance community supports (include existing and new resources) above Handoff Required? No Next Steps/Plan (including hand-off): I will research further and get back to Mr. Dircks Signature: Huntley Estelle, LCSW Contact Information: [phone]

## 2022-11-10 ENCOUNTER — Ambulatory Visit: Admit: 2022-11-10 | Payer: MEDICARE | Attending: Pharmacist Clinician (PhC)/ Clinical Pharmacy Specialist

## 2022-11-10 ENCOUNTER — Telehealth: Admit: 2022-11-10 | Payer: PRIVATE HEALTH INSURANCE | Attending: Family

## 2022-11-10 ENCOUNTER — Encounter: Admit: 2022-11-10 | Payer: PRIVATE HEALTH INSURANCE

## 2022-11-10 ENCOUNTER — Inpatient Hospital Stay: Admit: 2022-11-10 | Discharge: 2022-11-10 | Payer: MEDICARE

## 2022-11-10 ENCOUNTER — Ambulatory Visit: Admit: 2022-11-10 | Payer: MEDICARE

## 2022-11-10 DIAGNOSIS — Z803 Family history of malignant neoplasm of breast: Secondary | ICD-10-CM

## 2022-11-10 DIAGNOSIS — R0609 Other forms of dyspnea: Secondary | ICD-10-CM

## 2022-11-10 DIAGNOSIS — C9110 Chronic lymphocytic leukemia of B-cell type not having achieved remission: Secondary | ICD-10-CM

## 2022-11-10 DIAGNOSIS — Z79899 Other long term (current) drug therapy: Secondary | ICD-10-CM

## 2022-11-10 DIAGNOSIS — I119 Hypertensive heart disease without heart failure: Secondary | ICD-10-CM

## 2022-11-10 DIAGNOSIS — Z8616 Personal history of COVID-19: Secondary | ICD-10-CM

## 2022-11-10 DIAGNOSIS — Z79621 Long term (current) use of calcineurin inhibitor: Secondary | ICD-10-CM

## 2022-11-10 DIAGNOSIS — N400 Enlarged prostate without lower urinary tract symptoms: Secondary | ICD-10-CM

## 2022-11-10 DIAGNOSIS — C931 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: Secondary | ICD-10-CM

## 2022-11-10 DIAGNOSIS — Z79624 Long term (current) use of inhibitors of nucleotide synthesis: Secondary | ICD-10-CM

## 2022-11-10 DIAGNOSIS — F419 Anxiety disorder, unspecified: Secondary | ICD-10-CM

## 2022-11-10 DIAGNOSIS — Z9484 Stem cells transplant status: Secondary | ICD-10-CM

## 2022-11-10 DIAGNOSIS — I10 Essential (primary) hypertension: Secondary | ICD-10-CM

## 2022-11-10 DIAGNOSIS — Z9089 Acquired absence of other organs: Secondary | ICD-10-CM

## 2022-11-10 DIAGNOSIS — E782 Mixed hyperlipidemia: Secondary | ICD-10-CM

## 2022-11-10 DIAGNOSIS — C4491 Basal cell carcinoma of skin, unspecified: Secondary | ICD-10-CM

## 2022-11-10 DIAGNOSIS — Z85828 Personal history of other malignant neoplasm of skin: Secondary | ICD-10-CM

## 2022-11-10 DIAGNOSIS — E611 Iron deficiency: Secondary | ICD-10-CM

## 2022-11-10 DIAGNOSIS — Z8249 Family history of ischemic heart disease and other diseases of the circulatory system: Secondary | ICD-10-CM

## 2022-11-10 DIAGNOSIS — H409 Unspecified glaucoma: Secondary | ICD-10-CM

## 2022-11-10 DIAGNOSIS — Z9481 Bone marrow transplant status: Secondary | ICD-10-CM

## 2022-11-10 DIAGNOSIS — Z883 Allergy status to other anti-infective agents status: Secondary | ICD-10-CM

## 2022-11-10 DIAGNOSIS — F32A Depression: Secondary | ICD-10-CM

## 2022-11-10 DIAGNOSIS — C9200 Acute myeloblastic leukemia, not having achieved remission: Secondary | ICD-10-CM

## 2022-11-10 DIAGNOSIS — Z800 Family history of malignant neoplasm of digestive organs: Secondary | ICD-10-CM

## 2022-11-10 LAB — RETICULOCYTES
BKR WAM IRF: 11 % (ref 3.0–15.9)
BKR WAM RETICULOCYTE - ABS (3 DEC): 0.07 10Ë6 cells/uL — ABNORMAL LOW (ref 0.023–0.140)
BKR WAM RETICULOCYTE COUNT PCT (1 DEC): 2.2 % (ref 0.6–2.7)
BKR WAM RETICULOCYTE HGB EQUIVALENT: 39.3 pg — ABNORMAL HIGH (ref 28.2–35.7)

## 2022-11-10 LAB — COMPREHENSIVE METABOLIC PANEL
BKR A/G RATIO: 1.2 x 1000/ÂµL (ref 1.0–2.2)
BKR ALBUMIN: 3.6 g/dL (ref 3.6–5.1)
BKR ALKALINE PHOSPHATASE: 94 U/L (ref 9–122)
BKR ANION GAP: 13 (ref 7–17)
BKR ASPARTATE AMINOTRANSFERASE (AST): 17 U/L (ref 10–35)
BKR AST/ALT RATIO: 0.8
BKR BILIRUBIN TOTAL: 0.4 mg/dL (ref <=1.2)
BKR BLOOD UREA NITROGEN: 16 mg/dL (ref 8–23)
BKR BUN / CREAT RATIO: 12.5 % (ref 8.0–23.0)
BKR CO2: 21 mmol/L — ABNORMAL HIGH (ref 20–30)
BKR CREATININE: 1.28 mg/dL (ref 0.40–1.30)
BKR EGFR, CREATININE (CKD-EPI 2021): 60 mL/min/{1.73_m2} (ref >=60)
BKR GLOBULIN: 2.9 g/dL (ref 2.0–3.9)
BKR GLUCOSE: 148 mg/dL — ABNORMAL HIGH (ref 70–100)
BKR POTASSIUM: 4 mmol/L (ref 3.3–5.3)
BKR PROTEIN TOTAL: 6.5 g/dL (ref 5.9–8.3)
BKR SODIUM: 139 mmol/L (ref 136–144)
BKR WAM BASOPHIL ABSOLUTE COUNT.: 0.8 x 1000/ÂµL (ref 0.00–1.00)
BKR WAM BASOPHILS: 0.4 mg/dL (ref 0.0–<=1.2)
BKR WAM HEMOGLOBIN: 139 mmol/L — ABNORMAL LOW (ref 136–144)
BKR WAM MCHC: 13 g/dL (ref 7–17)
BKR WAM MONOCYTE ABSOLUTE COUNT.: 2.9 g/dL (ref 2.0–3.9)
BKR WAM MONOCYTES: 6.5 g/dL — ABNORMAL HIGH (ref 5.9–8.3)
BKR WAM PLATELETS: 16 mg/dL (ref 8–23)

## 2022-11-10 LAB — CBC WITH AUTO DIFFERENTIAL
BKR ALANINE AMINOTRANSFERASE (ALT): 0 % (ref 0.0–1.0)
BKR CALCIUM: 62.4 % (ref 39.0–72.0)
BKR CHLORIDE: 97.2 fL (ref 80.0–100.0)
BKR WAM ABSOLUTE IMMATURE GRANULOCYTES.: 0.01 x 1000/ÂµL (ref 0.00–0.30)
BKR WAM ABSOLUTE LYMPHOCYTE COUNT.: 0.61 x 1000/ÂµL (ref 0.60–3.70)
BKR WAM ABSOLUTE NRBC (2 DEC): 0 x 1000/ÂµL (ref 0.00–1.00)
BKR WAM ANC (ABSOLUTE NEUTROPHIL COUNT): 2.22 x 1000/ÂµL (ref 2.00–7.60)
BKR WAM EOSINOPHIL ABSOLUTE COUNT.: 0.06 x 1000/ÂµL (ref 0.00–1.00)
BKR WAM EOSINOPHILS: 1.7 % (ref 0.0–5.0)
BKR WAM HEMATOCRIT (2 DEC): 31.4 % — ABNORMAL LOW (ref 38.50–50.00)
BKR WAM IMMATURE GRANULOCYTES: 0.3 % (ref 0.0–1.0)
BKR WAM LYMPHOCYTES: 17.1 % (ref 17.0–50.0)
BKR WAM MCH (PG): 33.1 pg — ABNORMAL HIGH (ref 27.0–33.0)
BKR WAM MCV: 97.2 fL (ref 80.0–100.0)
BKR WAM MPV: 10.3 fL (ref 8.0–12.0)
BKR WAM NEUTROPHILS: 62.4 % (ref 39.0–72.0)
BKR WAM NUCLEATED RED BLOOD CELLS: 0 % (ref 0.0–1.0)
BKR WAM RDW-CV: 21.6 % — ABNORMAL HIGH (ref 11.0–15.0)
BKR WAM RED BLOOD CELL COUNT.: 3.23 M/ÂµL — ABNORMAL LOW (ref 4.00–6.00)
BKR WAM WHITE BLOOD CELL COUNT: 3.6 x1000/ÂµL — ABNORMAL LOW (ref 4.0–11.0)

## 2022-11-10 LAB — TRIGLYCERIDES: BKR TRIGLYCERIDES: 139 mg/dL (ref 0.0–1.0)

## 2022-11-10 LAB — PT/INR AND PTT (BH GH L LMW YH)
BKR INR: 1.11 x 1000/ÂµL (ref 0.86–1.12)
BKR PARTIAL THROMBOPLASTIN TIME: 23.2 s (ref 23.0–31.4)
BKR PROTHROMBIN TIME: 12.2 seconds (ref 9.6–12.3)

## 2022-11-10 LAB — TACROLIMUS LEVEL     (BH GH L LMW YH): BKR TACROLIMUS BLOOD: 13.1 ng/mL — ABNORMAL HIGH (ref 9–59)

## 2022-11-10 LAB — PHOSPHORUS     (BH GH L LMW YH): BKR PHOSPHORUS: 2.7 mg/dL (ref 2.2–4.5)

## 2022-11-10 LAB — HAPTOGLOBIN: BKR HAPTOGLOBIN: 202 mg/dL — ABNORMAL HIGH (ref 30–200)

## 2022-11-10 LAB — MAGNESIUM: BKR MAGNESIUM: 1.5 mg/dL — ABNORMAL LOW (ref 1.7–2.4)

## 2022-11-10 LAB — LACTATE DEHYDROGENASE: BKR LACTATE DEHYDROGENASE: 229 U/L (ref 122–241)

## 2022-11-10 MED ORDER — MAGNESIUM SULFATE 2 GRAM/50 ML (4 %) IN WATER INTRAVENOUS PIGGYBACK
2 | Freq: Once | INTRAVENOUS | Status: CP
Start: 2022-11-10 — End: ?
  Administered 2022-11-10: 14:00:00 2 mL/h via INTRAVENOUS

## 2022-11-10 NOTE — Progress Notes
 TRANSPLANT ATTENDINGFOLLOW UP PATIENT NOTEPT IDENTIFICATION: Douglas [name] is 72 y.o. male with [name] referred for consideration of allogeneic stem cell transplant at the request of Dr. Sherrell Puller INTERIM HISTORY: Douglas [name] is day 60 post transplant. Bone marrow biopsy September 10th, this was consistent with remission by morphology. Donor percentage was 100%, molecular studies are pending. He reports diarrhea and loose stools which is worse in the morning. He has some abdominal cramping at the time of bowel movements however, denies abdominal pain otherwise. He has been taking imodium with water twice a day. He is eating less due to metallic taste in mouth. He is not taking any supplements like Boost or Ensure. He has been taking imodium twice a day. He eats packed cereals or toast in the morning. He does not eat spicy food. He is drinking less water. He takes milk occasionally with medications. He denies rash, fever. He had a fall and denies hitting his head. He reports difficulty taking shower due to weakness. He did physical therapy for a few days. He was using walker however, does not need it now. We reviewed the current medication list and made the necessary changes to chart. HPI:   Douglas [name] has a hx of Hypertension, hyperlipidemia, basal cell cancer of the skin, iron deficiency anemia.  The patient was evaluated for cytopenias in 2021 in [place].  Initial evaluation from [facility], source records are not available today.  Per the patient's subsequent records, he initially presented with leukopenia, thrombocytopenia, and monocytosis.  Bone marrow biopsy showed findings consistent with [name].  Initial bone marrow was performed on August 24, 2019, was reviewed at [facility] with findings of multilineage dysplasia.  The marrow was 40% cellular.  Megakaryocytes showed frequent dysplasia with  hyperchromatic forms and clustering.  Blasts were not increased.  A FISH panel for MDS  loci was negative.  Karyotype was normal.  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  The patient was observed and was without any treatment or transfusions.  In 2023, he had an acute drop in hemoglobin, was found to be iron deficient.  Workup for this did not show a clear focus of blood loss or bleeding.  The patient has continued evaluation and established care with Dr. Sherrell Puller in June 2023 as he spends some summers in [place].  Of note, there is a label of CLL in the patient's chart, which I suspect was a typo, and find no evidence that support that diagnosis.  Dr. Dema Severin assessment in June 2023 per the CPSS molecular score being considered intermediate one with a low risk of development of leukemia and median survival over 5 years.  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  The patient had a marrow performed at [facility] on 29th with CD34 stain showing 10% to 15% blasts focally more than 20%.  Marrow was hypercellular at 70%.  Dysplastic megakaryocytes again appreciated.  Molecular studies are pending.  Given the findings, treatment with the decitabine and venetoclax was recommended.  A referral has been placed for urgent EGD colonoscopy given the low iron concern for occult source of bleeding.He started treatment last week which he is tolerated well. He received 5-6 red blood cell transfusion in the past several month. He was suffering weakness  and dyspnea which is now improved after transfusion. He does not have a significant infectious history, he did have COVID-19 in the past from which he recovered. He has hypertension and hyperlipidemia managed by cardiologist in [place]. He reports a negative stress test in the past. He has had number of skin cancers and has had several MOHS procedures. He is followed with a urologist for some BPH and abnormal PSA. He has had 2 prostate biopsies which was negative. He carries a diagnosis of iron deficiency and require transfusion in [place] after a significant and acute drop in hemoglobin. There was no clinical evidence of bleeding. Endoscopy and colonoscopy are planned to exclude a source of bleeding. Douglas [name] is a 72 yo gentlemen that lives with is wife in [place].  He is currently spending the summer in [place] visiting family.  He has 2 daughters and 5 grandchildren.  Retired, having worked for [employer].  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  He is a social drinker of 1-2 beers per week, denies illicit drug use and is a non smoker.  Covid-19 vaccine series completed. 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)   CLL (chronic lymphocytic leukemia) (HC Code)   Depression   DOE (dyspnea on exertion)   Enlarged prostate   Essential hypertension   Family history of ischemic heart disease   Glaucoma   Hypertensive heart disease without heart failure   Mixed hyperlipidemia  Surgical HistoryPast Surgical History: Procedure Laterality Date  ADENOIDECTOMY    BONE MARROW BIOPSY    PROSTATE BIOPSY    TONSILLECTOMY    VASCULAR SURGERY   AllergiesAllergies Allergen Reactions  Voriconazole Other (See Comments)   Gingival edema/redness making it difficult to bite down  Current Outpatient Medications:   acyclovir (ZOVIRAX) 400 mg tablet, Take 1 tablet (400 mg total) by mouth 2 (two) times daily., Disp: 60 tablet, Rfl: 11  amLODIPine (NORVASC) 10 mg tablet, Take 1 tablet (10 mg total) by mouth., Disp: , Rfl:   amLODIPine (NORVASC) 10 mg tablet, Take 1 tablet (10 mg total) by mouth daily., Disp: , Rfl:   finasteride (PROSCAR) 5 mg tablet, TAKE 1 TABLET BY MOUTH DAILY FOR ENLARGED PROSTATE WITH URINATION PROBLEM TAKE WITH OR WITHOUT MEALS, Disp: , Rfl:   folic acid (FOLVITE) 1 mg tablet, Take 1 tablet (1 mg total) by mouth daily., Disp: 30 tablet, Rfl: 11  isavuconazonium (CRESEMBA) 186 mg capsule, Take 2 capsules (372 mg total) by mouth daily., Disp: 56 capsule, Rfl: 11  latanoprost (XALATAN) 0.005 % ophthalmic solution, Place 1 drop into both eyes nightly., Disp: , Rfl:   magnesium oxide 400 mg magnesium Cap, Take 400 mg by mouth daily., Disp: , Rfl:   Miscellaneous Medical Supply, Please provide rolling walker, Use as directed., Disp: 1 each, Rfl: 0  multivitamin tablet, Take 1 tablet by mouth daily., Disp: 30 tablet, Rfl: 11  ondansetron (ZOFRAN) 8 mg tablet, Take 1 tablet (8 mg total) by mouth every 8 (eight) hours as needed for nausea or vomiting., Disp: 30 tablet, Rfl: 2  pentamidine (PENTAM) 300 mg inhalation solution, Inhale 6 mLs (300 mg total) into the lungs every 28 days. Last given  10/16/22, Disp: , Rfl:   prochlorperazine (COMPAZINE) 10 mg tablet, Take 1 tablet (10 mg total) by mouth every 6 (six) hours as needed for nausea., Disp: 30 tablet, Rfl: 3  sodium chloride 0.9 % flush, Use 10 mLs by IV Push route daily. In both lumens, Disp: 600 mL, Rfl: 11  tacrolimus (PROGRAF) 0.5 mg Immediate Release capsule, Take 1 capsule (0.5 mg total) by mouth 2 (two) times daily. (Patient taking differently: Take 1 capsule (0.5 mg total) by mouth 2 (two) times daily. Total dose: 6.5  mg BID), Disp: 60 capsule, Rfl: 11  tacrolimus (PROGRAF) 1 mg immediate release capsule, Take 1 capsule (1 mg total) by mouth 2 (two) times daily. (Patient taking differently: Take 1 capsule (1 mg total) by mouth 2 (two) times daily. Total dose: 6.5mg  BID), Disp: 60 capsule, Rfl: 11  tacrolimus (PROGRAF) 5 mg Immediate Release capsule, Take 1 capsule (5 mg total) by mouth 2 (two) times daily. (Patient taking differently: Take 1 capsule (5 mg total) by mouth 2 (two) times daily. 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)   CLL (chronic lymphocytic leukemia) (HC Code)   Depression   DOE (dyspnea on exertion)   Enlarged prostate   Essential hypertension   Family history of ischemic heart disease   Glaucoma   Hypertensive heart disease without heart failure   Mixed hyperlipidemia  Family History Problem Relation Age of Onset  Breast cancer Mother   Liver cancer Mother   Heart disease Father   Prostate cancer Father   Breast cancer Sister   Thyroid disease Sister   Hearing loss Sister   Diabetes Daughter   No Known Problems Daughter   No Known Problems Grandchild  Social History Socioeconomic History  Marital status: Married   Spouse name: Not on file  Number of children: Not on file  Years of education: Not on file  Highest education level: Not on file Occupational History  Not on file Tobacco Use  Smoking status: Never  Smokeless tobacco: Never Vaping Use  Vaping Use: Never used Substance and Sexual Activity  Alcohol use: Yes   Alcohol/week: 2.0 standard drinks of alcohol   Types: 2 Cans of beer per week   Comment: social drinker once a week  Drug use: Never  Sexual activity: Not on file Other Topics Concern  Not on file Social History Narrative  Not on file Social Determinants of Health Financial Resource Strain: Low Risk  (08/05/2021)  Overall Financial Resource Strain (CARDIA)   Difficulty of Paying Living Expenses: Not very hard Food Insecurity: No Food Insecurity (09/18/2022)  Hunger Vital Sign   Worried About Running Out of Food in the Last Year: Never true   Ran Out of Food in the Last Year: Never true Transportation Needs: No Transportation Needs (09/18/2022)  PRAPARE - Designer, jewellery (Medical): No   Lack of Transportation (Non-Medical): No Physical Activity: Not on file Stress: Not on file Social Connections: Not on file Intimate Partner Violence: Not on file Housing Stability: Low Risk  (09/18/2022)  Housing Stability   Housing Stability: I have a steady place to live   Housing Stability: Not  on file EXAM:Temp:  [98.2 ?F (36.8 ?C)] 98.2 ?F (36.8 ?C)Pulse:  [73] 73Resp:  [18] 18BP: (126)/(73) 126/73SpO2:  [100 %] 100 %General Appearance:He is heavy set, appears wellHEENT:  Mucous membranes moist, No oral lesions, no erythema, teeth are adequate repair Lungs: ClearCor: RRR, no m/r/gAbd: soft nontender, no hepatosplenomegaly, Extr: trace edema.  Skin: some follicular erythema noted on the chest and abdomen, which is mild. Lymph Nodes - no palpable adenopathy. Neurologic: mentation appears normal, normal speech, strength symmetric. LABSLab Results Component Value Date  WBC 3.6 (L) 11/10/2022  HGB 10.7 (L) 11/10/2022  HCT 31.40 (L) 11/10/2022  MCV 97.2 11/10/2022  PLT 195 11/10/2022 Comprehensive Metabolic Panel:Lab Results Component Value Date  GLU 148 (H) 11/10/2022  BUN 16 11/10/2022  CREATININE 1.28 11/10/2022  NA 139 11/10/2022  K 4.0 11/10/2022  CL 105 11/10/2022  CO2 21 11/10/2022  ALBUMIN 3.6 11/10/2022  PROT 6.5 11/10/2022  BILITOT 0.4 11/10/2022  ALKPHOS 94 11/10/2022  ALT 21 11/10/2022  GLOB 2.9 11/10/2022  CALCIUM 9.1 11/10/2022 Results in Past 7 DaysResult Component Current Result LD 229 (11/10/2022) ]A/P:  Douglas [name] is 72 y.o. male with [name].  His disease is characterized by minor cytopenias over a long period of observation with recently worsened anemia, thrombocytopenia, and a marrow with increased blasts consistent with disease progression. 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 Therapy was   started with decitabine and venetoclax, and transplantation  recommended given the now increased risk of shorten survival. The patient has had a formal  search of the registry and a 10/10 match was identified.A/P: Day +47,  no evidence of gvhd. 1) Disease - Bone marrow biopsy September 10th, this was consistent with remission by morphology. Donor percentage was 100%, molecular studies are pending.  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 Goal is tac 5-10, check weekly ldh, haptoglobin, triglycerides3) Heme - early engraftment. Donor, Recipient both blood type A+ve. 4) ID - Prophylaxis with acyclovir, pentamidine, Cresemba. He is not at risk for CMV. 5) Hypertension - He is on continued Norvasc. 6) Liver: Continue Ursodiol, monitor mild LFT changes. 7) History of skin cancer: Needs to be addressed after 3 month time point.8) Abnormal spine, lesion on MRI pre- transplant: Planned follow up MRI in 3-4 months. Dr. Chrystine Oiler MD Scribed for Chrystine Oiler, MD by Simona Huh, medical scribe September 24, 2024The documentation recorded by the scribe accurately reflects the services I personally performed and the decisions made by me. I reviewed and confirmed all material entered and/or pre-charted by the scribe.

## 2022-11-10 NOTE — Telephone Encounter
 Tac super therapeutic at 13.1 on 6.5mg  PO BID. Did not take before am blood work. Will skip tonight and dose reduce to 5mg  PO BID. He verbalized understanding. Elenore Rota, APRN

## 2022-11-10 NOTE — Other
 Post-HSCT Follow-Up Pharmacist note Diagnosis: CMMLConditioning:   Fludarabine/Melphalan with post-transplant Cyclophosphamide Transplant Type:10/10 MUD Allogeneic PBSCTTransplant Date (Day 0): 09/24/22 Referring Provider: Chrystine Oiler, MDEncounter Date: 11/10/2022 = D+47[name] is a 72 y.o. year-old male with history of CMML s/p HSCT. Patient is being seen by the clinic pharmacist for medication management included post stem cell transplant, hematological related disorders and GI related disorders .  This visit was completed as a joint-visit with provider present and is aware of the care plan.Subjective: No need signs of skin rash, diarrhea.  Still having tremors, if tacrolimus level still 9 plan to decrease tacrolimusReceived isavuconazole refill on SaturdayHeld tacrolimus prior to morning labs Assessment and Recommendations:  Summary of Medication Changes and Follow-up Items Discussed with Provider:Tacrolimus 13.1 - will decrease to 5 mg po twice dailyGVHD (immunosuppression)Tacrolimus: Current dose: 6.5 mg PO twice daily09/24/24  level: 13.1 - will decrease to 5 mg po twice dailyGoal range: 6-11 ng/mLDosage dispensed: 0.5mg , 1mg , 5mg  capsulesPharmacy dispensed: OPSPost transplant cyclophosphamide: completed inpatient *All immunosuppression is managed by the bone marrow transplant team.Infectious Disease (prophylaxis, monitoring, and active infections)ViralCurrent prophylaxis/treatment: Acyclovir 400mg  PO twice dailyPrevious viral infections: 	HSV/VZV +/?CMV -/?, 9/17 CMV negativeEBV IgG negativeBK virus N/APneumocystis jeuvecii pneumoniaCurrent prophylaxis/treatment: Pentamidine 300mg  inhaled monthly last given 8/30/24Previous PJP infection: N/AT-cell subsetDay +100 (date):Day +180 (date):1 year (date):Toxoplasmosis IgG negativeCurrent prophylaxis/treatment: NoneFungal/MoldCurrent prophylaxis/treatment: Isavuconazole 372mg  PO daily - history of prolonged cytopenias Previous fungal/mold infection: N/A, Hx of gingival changes to voriconazole Drug monitoring: N/AGalactomann - 10/01/22 negFungitell - 10/01/22 < 31Bacterial	Current prophylaxis/treatment: None	Previous bacterial infection: N/A	Drug monitoring: N/A	Blood cultures: N/ASupportive careHyperlipidemia monitoring (triglycerides, Cholesterol, LDL, HDL)Current treatment: None	Prior treatment: Atorvastatin 40mg  PO daily, held with transplantBaseline (09/18/2022): 68 Today (11/10/22): 139Hypertension monitoring (blood pressure)Current treatment: Amlodipine 10mg  PO dailyPrior treatment: Metoprolol and lisinopril on hold since transplant Today (11/10/22):  126/73GastrointestionalStress ulcer prophylaxis: None, one episode of acid refluxNausea/Vomiting: Intermittent nausea, takes ondansetron with good effect but prefers prochlorperazine, Diarrhea/constipation: Continues to have soft stools, loperamidine prnOral care and mucositis: NoneVeno-occlusive diseaseCurrent treatment:  Ursodiol 500mg  PO twice daily until day +90Anticoagulation	Current prophylaxis/treatment: None	Previous anticoagulation history: Aspirin 81mg  PO daily, for cardiac ppx - not restartingElectrolyte supplementation:	Current treatment: Magnesium oxide 400mg  BID	Monitoring: Mg 1.5 09/24/24Hypogammaglobulinemia	Current treatment:	Goal: 500-800 ng/dLIVIG last dose:Baseline IgG level:Last IgG level:IgG scheduled levelsDay +100 (date):Day +180 (date):	1 year (date):	Maintenance Therapy Plan: VaccinationsBaseline titers: 	Hepatitis B: 	Pneumococcal: 	VZV 	Measles: , Mumps: , Rubella: 12 month: 14 month: 18 month:	Hep B titer	Pneumococcal titers24 month:Titers drawn 2 months after 24 months:Redose needed:Medication History:Douglas [name] presents to today's visit with their med action plan.  A medication history was obtained from the patient and spouse.Adherence:The patient reports 0 missed doses of medications in the past 1 week(s). The patient does not report the barriers to adherence. Discussed potential outcomes associated with non-adherenceAffordability:The patient denies concerns with affordability of medications at this time. Medication Management:Plan for medication management assistance: An updated medication action plan was given to the patient, no changesPatient would benefit from continued pharmacy follow-up at future clinic visits for medication managementEducated patient on the above changesPatient did not request refills todayObjective: [id number] Results (from the past 168 hour(s)) CBC auto differential  Collection Time: 11/10/22  9:33 AM Result Value Ref Range  WBC 3.6 (L) 4.0 - 11.0 x1000/?L  RBC 3.23 (L) 4.00 - 6.00 M/?L  Hemoglobin 10.7 (L) 13.2 - 17.1 g/dL  Hematocrit 40.98 (L) 11.91 - 50.00 %  MCV 97.2 80.0 - 100.0 fL  MCH 33.1 (H) 27.0 - 33.0 pg  MCHC 34.1 31.0 - 36.0 g/dL  RDW-CV  21.6 (H) 11.0 - 15.0 %  Platelets 195 150 - 420 x1000/?L  MPV 10.3 8.0 - 12.0 fL  Neutrophils 62.4 39.0 - 72.0 %  Lymphocytes 17.1 17.0 - 50.0 %  Monocytes 17.7 (H) 4.0 - 12.0 %  Eosinophils 1.7 0.0 - 5.0 %  Basophil 0.8 0.0 - 1.4 %  Immature Granulocytes 0.3 0.0 - 1.0 %  nRBC 0.0 0.0 - 1.0 %  Absolute Lymphocyte Count 0.61 0.60 - 3.70 x 1000/?L  Monocyte Absolute Count 0.63 0.00 - 1.00 x 1000/?L  Eosinophil Absolute Count 0.06 0.00 - 1.00 x 1000/?L  Basophil Absolute Count 0.03 0.00 - 1.00 x 1000/?L  Absolute Immature Granulocyte Count 0.01 0.00 - 0.30 x 1000/?L  Absolute nRBC 0.00 0.00 - 1.00 x 1000/?L  ANC (Abs Neutrophil Count) 2.22 2.00 - 7.60 x 1000/?L [id number] Results (from the past 168 hour(s)) Comprehensive metabolic panel  Collection Time: 11/10/22  9:33 AM Result Value Ref Range  Sodium 139 136 - 144 mmol/L  Potassium 4.0 3.3 - 5.3 mmol/L  Chloride 105 98 - 107 mmol/L  CO2 21 20 - 30 mmol/L  Anion Gap 13 7 - 17  Glucose 148 (H) 70 - 100 mg/dL  BUN 16 8 - 23 mg/dL  Creatinine 6.38 7.56 - 1.30 mg/dL  Calcium 9.1 8.8 - 43.3 mg/dL  BUN/Creatinine Ratio 29.5 8.0 - 23.0  Total Protein 6.5 5.9 - 8.3 g/dL  Albumin 3.6 3.6 - 5.1 g/dL  Total Bilirubin 0.4 <=1.8 mg/dL  Alkaline Phosphatase 94 9 - 122 U/L  Alanine Aminotransferase (ALT) 21 9 - 59 U/L  Aspartate Aminotransferase (AST) 17 10 - 35 U/L  Globulin 2.9 2.0 - 3.9 g/dL  A/G Ratio 1.2 1.0 - 2.2  AST/ALT Ratio 0.8 Reference Range Not Established  eGFR (Creatinine) 60 >=60 mL/min/1.77m2 LDH: Recent Results (from the past 168 hour(s)) Lactate dehydrogenase  Collection Time: 11/10/22  9:33 AM Result Value Ref Range  LD 229 122 - 241 U/L Patient Education: The following education was provided for: Initiation, modification, or discontinuation of drug therapy, Assessment of efficacy and safety of drug therapy, and Adverse effect presentation, evaluation, and monitoring during today?s visit to the patient and spouse regarding medication management: purpose of each medication, side effects of medications, and importance of adherence with the regimen(s) Time Spent: 15 minutes, face-to-face consultPATIENT AND/OR CAREGIVER VERBALIZED UNDERSTANDING OF CARE PLAN: yesPATIENT ADVISED TO CALL BACK WITH QUESTIONS, CONCERNS, OR CHANGE IN SYMPTOMS.Signed by: Jorene Guest, PharmD BCPS BCOP09/24/24 11:19 AM

## 2022-11-10 NOTE — Progress Notes
 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 Sherrell Puller INTERIM HISTORY: Douglas [name] is day 29 post transplant. improved energy. No fevers. No trouble with bowels or rash. Some intermittent nausea. HPI:   Douglas [name] has a hx of Hypertension, hyperlipidemia, basal cell cancer of the skin, iron deficiency anemia.  The patient was evaluated for cytopenias in 2021 in [place].  Initial evaluation from [facility], source records are not available today.  Per the patient's subsequent records, he initially presented with leukopenia, thrombocytopenia, and monocytosis.  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  Initial bone marrow was performed on August 24, 2019, was reviewed at [facility] with findings of multilineage dysplasia.  The marrow was 40% cellular.  Megakaryocytes showed frequent dysplasia with  hyperchromatic forms and clustering.  Blasts were not increased.  A FISH panel for MDS  loci was negative.  Karyotype was normal.  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  The patient was observed and was without any treatment or transfusions.  In 2023, he had an acute drop in hemoglobin, was found to be iron deficient.  Workup for this did not show a clear focus of blood loss or bleeding.  The patient has continued evaluation and established care with Dr. Sherrell Puller in June 2023 as he spends some summers in [place].  Of note, there is a label of CLL in the patient's chart, which I suspect was a typo, and find no evidence that support that diagnosis.  Dr. Dema Severin assessment in June 2023 per the CPSS molecular score being considered intermediate one with a low risk of development of leukemia and median survival over 5 years.  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  The patient had a marrow performed at [facility] on 29th with CD34 stain showing 10% to 15% blasts focally more than 20%.  Marrow was hypercellular at 70%.  Dysplastic megakaryocytes again appreciated.  Molecular studies are pending.  Given the findings, treatment with the decitabine and venetoclax was recommended.  A referral has been placed for urgent EGD colonoscopy given the low iron concern for occult source of bleeding.He started treatment last week which he is tolerated well. He received 5-6 red blood cell transfusion in the past several month. He was suffering weakness and dyspnea which is now improved after transfusion. He does not have a significant infectious history, he did have COVID-19 in the past from which he recovered. He has hypertension and hyperlipidemia managed by cardiologist in [place]. He reports a negative stress test in the past. He has had number of skin cancers and has had several MOHS procedures. He is followed with a urologist for some BPH and abnormal PSA. He has had 2 prostate biopsies which was negative. He carries a diagnosis of iron deficiency and require transfusion in [place] after a significant and acute drop in hemoglobin. There was no clinical evidence of bleeding. Endoscopy and colonoscopy are planned to exclude a source of bleeding. Douglas [name] is a 72 yo gentlemen that lives with is wife in [place].  He is currently spending the summer in [place] visiting family.  He has 2 daughters  and 5 grandchildren.  Retired, having worked for [employer].  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  He is a social drinker of 1-2 beers per week, denies illicit drug use and is a non smoker. Covid-19 vaccine series completed. 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)   CLL (chronic lymphocytic leukemia) (HC Code)   Depression   DOE (dyspnea on exertion)   Enlarged prostate   Essential hypertension   Family history of ischemic heart disease   Glaucoma   Hypertensive heart disease without heart failure   Mixed hyperlipidemia  Surgical HistoryPast Surgical History: Procedure Laterality Date  ADENOIDECTOMY    BONE MARROW BIOPSY    PROSTATE BIOPSY    TONSILLECTOMY    VASCULAR SURGERY   AllergiesAllergies Allergen Reactions  Voriconazole Other (See Comments)   Gingival edema/redness making it difficult to bite down  Current Outpatient Medications:   acyclovir (ZOVIRAX) 400 mg tablet, Take 1 tablet (400 mg total) by mouth 2 (two) times daily., Disp: 60 tablet, Rfl: 11  amLODIPine (NORVASC) 10 mg tablet, Take 1 tablet (10 mg total) by mouth., Disp: , Rfl:   amLODIPine (NORVASC) 10 mg tablet, Take 1 tablet (10 mg total) by mouth daily., Disp: , Rfl:   finasteride (PROSCAR) 5 mg tablet, TAKE 1 TABLET BY MOUTH DAILY FOR ENLARGED PROSTATE WITH URINATION PROBLEM TAKE WITH OR WITHOUT MEALS, Disp: , Rfl:   folic acid (FOLVITE) 1 mg tablet, Take 1 tablet (1 mg total) by mouth daily., Disp: 30 tablet, Rfl: 11  isavuconazonium (CRESEMBA) 186 mg capsule, Take 2 capsules (372 mg total) by mouth daily., Disp: 60 capsule, Rfl: 0  latanoprost (XALATAN) 0.005 % ophthalmic solution, Place 1 drop into both eyes nightly., Disp: , Rfl:   magnesium oxide 400 mg magnesium Cap, Take 400 mg by mouth daily., Disp: , Rfl:   Miscellaneous Medical Supply, Please provide rolling walker, Use as directed., Disp: 1 each, Rfl: 0  multivitamin tablet, Take 1 tablet by mouth daily., Disp: 30 tablet, Rfl: 11  ondansetron (ZOFRAN) 8 mg tablet, Take 1 tablet (8 mg total) by mouth every 8 (eight) hours as needed for nausea or vomiting., Disp: 20 tablet, Rfl: 2  pentamidine (PENTAM) 300 mg inhalation solution, Inhale 6 mLs (300 mg total) into the lungs every 28 days. Last given 10/16/22, Disp: , Rfl:   prochlorperazine (COMPAZINE) 10 mg tablet, Take 1 tablet (10 mg total) by mouth every 6 (six) hours as needed for nausea., Disp: 30 tablet, Rfl: 3  sodium chloride 0.9 % flush, Use 10 mLs by IV Push route daily. In both lumens, Disp: 600 mL, Rfl: 11  tacrolimus (PROGRAF) 0.5 mg Immediate Release capsule, Take 1 capsule (0.5 mg total) by mouth 2 (two) times daily. (Patient taking differently: Take 1 capsule (0.5 mg total) by mouth 2 (two) times daily. Total dose: 6.5  mg BID), Disp: 60 capsule, Rfl: 11  tacrolimus (PROGRAF) 1 mg immediate release capsule, Take 1 capsule (1 mg total) by mouth 2 (two) times daily. (Patient taking differently: Take 1 capsule (1 mg total) by mouth 2 (two) times daily. Total dose: 6.5mg  BID), Disp: 60 capsule, Rfl: 11  tacrolimus (PROGRAF) 5 mg Immediate Release capsule,  Take 1 capsule (5 mg total) by mouth 2 (two) times daily. (Patient taking differently: Take 1 capsule (5 mg total) by mouth 2 (two) times daily. 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 (HC Code)   CLL (chronic lymphocytic leukemia) (HC Code)   Depression   DOE (dyspnea on exertion)   Enlarged prostate   Essential hypertension   Family history of ischemic heart disease   Glaucoma   Hypertensive heart disease without heart failure   Mixed hyperlipidemia  Family History Problem Relation Age of Onset  Breast cancer Mother   Liver cancer Mother   Heart disease Father   Prostate cancer Father   Breast cancer Sister   Thyroid disease Sister   Hearing loss Sister   Diabetes Daughter   No Known Problems Daughter   No Known Problems Grandchild  Social History Socioeconomic History  Marital status: Married   Spouse name: Not on file  Number of children: Not on file  Years of education: Not on file  Highest education level: Not on file Occupational History  Not on file Tobacco Use  Smoking status: Never  Smokeless tobacco: Never Vaping Use  Vaping Use: Never used Substance and Sexual Activity  Alcohol use: Yes   Alcohol/week: 2.0 standard drinks of alcohol   Types: 2 Cans of beer per week   Comment: social drinker once a week  Drug use: Never  Sexual activity: Not on file Other Topics Concern  Not on file Social History Narrative  Not on file Social Determinants of Health Financial Resource Strain: Low Risk  (08/05/2021)  Overall Financial Resource Strain (CARDIA)   Difficulty of Paying Living Expenses: Not very hard Food Insecurity: No Food Insecurity (09/18/2022)  Hunger Vital Sign   Worried About Running Out of Food in the Last Year: Never true   Ran Out of Food in the Last Year: Never true Transportation Needs: No Transportation Needs (09/18/2022)  PRAPARE - Designer, jewellery (Medical): No   Lack of Transportation (Non-Medical): No Physical Activity: Not on file Stress: Not on file Social Connections: Not on file Intimate Partner Violence: Not on file Housing Stability: Low Risk  (09/18/2022)  Housing Stability   Housing Stability: I have a steady place to live   Housing Stability: Not on file EXAM:Temp:  [97.8 ?F (36.6 ?C)] 97.8 ?F (36.6 ?C)Pulse:  [70] 70Resp:  [18] 18BP: (133)/(81) 133/81SpO2:  [100 %] 100 %General Appearance:He is heavy set, appears wellHEENT:  Mucous membranes moist, No oral lesions, no erythema, teeth are adequate repair Lungs: ClearCor: RRR, no m/r/gAbd: soft nontender, no hepatosplenomegaly, Extr: trace edema.  Skin: some follicular erythema noted on the chest and abdomen, which is mild. Lymph Nodes - no palpable adenopathy. Neurologic: mentation appears normal, normal speech, strength symmetric. LABSLab Results Component Value Date  WBC 10.6 10/23/2022  HGB 8.9 (L) 10/23/2022  HCT 27.30 (L) 10/23/2022  MCV 91.9 10/23/2022  PLT 141 (L) 10/23/2022 Comprehensive Metabolic Panel:Lab Results Component Value Date  GLU 139 (H) 10/23/2022  BUN 11 10/23/2022  CREATININE 1.12 10/23/2022  NA 138 10/23/2022  K 3.8 10/23/2022  CL 104 10/23/2022  CO2 22 10/23/2022  ALBUMIN 3.7 10/23/2022  PROT 6.6 10/23/2022  BILITOT 0.4 10/23/2022  ALKPHOS 113 10/23/2022  ALT 42 10/23/2022  GLOB 2.9 10/23/2022  CALCIUM 9.0 10/23/2022 Results in Past 7 DaysResult Component Current Result LD 432 (H) (10/23/2022) ]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  His disease is characterized by minor cytopenias over a long period of observation with recently worsened anemia, thrombocytopenia, and a marrow with increased blasts consistent with disease progression. 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 Therapy was   started with decitabine and venetoclax, and transplantation  recommended given the now increased risk of shorten survival. The patient has had a formal  search of the registry and a 10/10 match was identified.A/P: Day +29,  no evidence of gvhd. 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 Goal is tac 5-10, check weekly ldh, haptoglobin, triglycerides3) Heme - early engraftment. Donor, Recipient both blood type A+ve. 4) ID - Prophylaxis with acyclovir, pentamidine, Cresemba. He is not at risk for CMV. 5) Hypertension - He is on continued Norvasc. 6) Liver: Continue Ursodiol, monitor mild LFT changes. 7) ABNL mri spine - plan follow up 4-6 months postDr. Chrystine Oiler MD Scribed for Chrystine Oiler, MD by Simona Huh, medical scribe September 6, 2024The documentation recorded by the scribe accurately reflects the services I personally performed and the decisions made by me. I reviewed and confirmed all material entered and/or pre-charted by the scribe.

## 2022-11-10 NOTE — Progress Notes
 Patient Day +20 MUD for CMML- AML. Myer reports mechanical fall at home this week where he tripped going up the stairs while carrying items in both hands. Denies head strike, injury, bleeding, loss of consciousness. He reports worsening diarrhea with episodes 2-3 times a day of loose and watery stool. Denies abdominal cramping with it. Denies fevers, chills, dizziness, rash, bleeding. Labs drawn via [facility]. Complete Blood CountResults in Past 7 DaysResult Component Current Result Hematocrit 31.40 (L) (11/10/2022) Hemoglobin 10.7 (L) (11/10/2022) MCH 33.1 (H) (11/10/2022) MCHC 34.1 (11/10/2022) MCV 97.2 (11/10/2022) MPV 10.3 (11/10/2022) Platelets 195 (11/10/2022) RBC 3.23 (L) (11/10/2022) WBC 3.6 (L) (11/10/2022) No transfusions required. Mag 1.5- 2g IV magnesium given per standing orders. 1L NS given. Hickman dressing changed per protocol- site within normal limits with no signs of infection. Patient seen by [name]. He ambulated out of clinic with his wife. RTC 10/1.

## 2022-11-11 LAB — CYTOMEGALOVIRUS QUANTITATIVE PCR, PLASMA     (BH GH L LMW YH): BKR CMV QUANTITATIVE PCR: NOT DETECTED seconds (ref 23.0–31.4)

## 2022-11-13 ENCOUNTER — Ambulatory Visit: Admit: 2022-11-13 | Payer: MEDICARE | Attending: Medical Oncology

## 2022-11-13 ENCOUNTER — Ambulatory Visit: Admit: 2022-11-13 | Payer: MEDICARE

## 2022-11-13 LAB — FISH (CYTOGENETICS) (YMG)

## 2022-11-13 LAB — CCS, HEME     (YH)

## 2022-11-16 LAB — CHROMOSOME ANALYSIS (CYTOGENETICS) (YMG): BKR BANDING RESOLUTION: 400

## 2022-11-17 ENCOUNTER — Inpatient Hospital Stay: Admit: 2022-11-17 | Discharge: 2022-11-17 | Payer: PRIVATE HEALTH INSURANCE

## 2022-11-17 ENCOUNTER — Ambulatory Visit: Admit: 2022-11-17 | Payer: MEDICARE

## 2022-11-17 DIAGNOSIS — Z5181 Encounter for therapeutic drug level monitoring: Secondary | ICD-10-CM

## 2022-11-17 DIAGNOSIS — Z9481 Bone marrow transplant status: Secondary | ICD-10-CM

## 2022-11-17 DIAGNOSIS — I10 Essential (primary) hypertension: Secondary | ICD-10-CM

## 2022-11-17 DIAGNOSIS — C931 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: Secondary | ICD-10-CM

## 2022-11-17 DIAGNOSIS — Z79624 Long term (current) use of inhibitors of nucleotide synthesis: Secondary | ICD-10-CM

## 2022-11-17 DIAGNOSIS — Z9221 Personal history of antineoplastic chemotherapy: Secondary | ICD-10-CM

## 2022-11-17 DIAGNOSIS — R5381 Other malaise: Secondary | ICD-10-CM

## 2022-11-17 DIAGNOSIS — Z79899 Other long term (current) drug therapy: Secondary | ICD-10-CM

## 2022-11-17 DIAGNOSIS — Z9484 Stem cells transplant status: Secondary | ICD-10-CM

## 2022-11-17 DIAGNOSIS — D84821 Immunodeficiency due to drugs: Secondary | ICD-10-CM

## 2022-11-17 DIAGNOSIS — M25511 Pain in right shoulder: Secondary | ICD-10-CM

## 2022-11-17 DIAGNOSIS — Z79621 Long term (current) use of calcineurin inhibitor: Secondary | ICD-10-CM

## 2022-11-17 DIAGNOSIS — R638 Other symptoms and signs concerning food and fluid intake: Secondary | ICD-10-CM

## 2022-11-17 DIAGNOSIS — C9110 Chronic lymphocytic leukemia of B-cell type not having achieved remission: Secondary | ICD-10-CM

## 2022-11-17 DIAGNOSIS — C9200 Acute myeloblastic leukemia, not having achieved remission: Secondary | ICD-10-CM

## 2022-11-17 DIAGNOSIS — Z7969 Long term (current) use of other immunomodulators and immunosuppressants: Secondary | ICD-10-CM

## 2022-11-17 LAB — COMPREHENSIVE METABOLIC PANEL
BKR A/G RATIO: 1.2 (ref 1.0–2.2)
BKR ALANINE AMINOTRANSFERASE (ALT): 18 U/L (ref 9–59)
BKR ALBUMIN: 3.8 g/dL (ref 3.6–5.1)
BKR ALKALINE PHOSPHATASE: 95 U/L (ref 9–122)
BKR ANION GAP: 13 (ref 7–17)
BKR ASPARTATE AMINOTRANSFERASE (AST): 15 U/L (ref 10–35)
BKR AST/ALT RATIO: 0.8
BKR BILIRUBIN TOTAL: 0.4 mg/dL (ref <=1.2)
BKR BLOOD UREA NITROGEN: 16 mg/dL (ref 8–23)
BKR BUN / CREAT RATIO: 14.2 (ref 8.0–23.0)
BKR CALCIUM: 9.3 mg/dL (ref 8.8–10.2)
BKR CHLORIDE: 106 mmol/L (ref 98–107)
BKR CO2: 21 mmol/L (ref 20–30)
BKR CREATININE: 1.13 mg/dL (ref 0.40–1.30)
BKR EGFR, CREATININE (CKD-EPI 2021): >60 mL/min/{1.73_m2} (ref >=60)
BKR GLOBULIN: 3.1 g/dL (ref 2.0–3.9)
BKR GLUCOSE: 149 mg/dL — ABNORMAL HIGH (ref 70–100)
BKR POTASSIUM: 3.9 mmol/L (ref 3.3–5.3)
BKR PROTEIN TOTAL: 6.9 g/dL (ref 5.9–8.3)
BKR SODIUM: 140 mmol/L (ref 136–144)

## 2022-11-17 LAB — CBC WITH AUTO DIFFERENTIAL
BKR WAM ABSOLUTE IMMATURE GRANULOCYTES.: 0.03 x 1000/ÂµL (ref 0.00–0.30)
BKR WAM ABSOLUTE LYMPHOCYTE COUNT.: 0.67 x 1000/ÂµL (ref 0.60–3.70)
BKR WAM ABSOLUTE NRBC (2 DEC): 0 x 1000/ÂµL (ref 0.00–1.00)
BKR WAM ANC (ABSOLUTE NEUTROPHIL COUNT): 5.21 x 1000/ÂµL (ref 2.00–7.60)
BKR WAM BASOPHIL ABSOLUTE COUNT.: 0.05 x 1000/ÂµL (ref 0.00–1.00)
BKR WAM BASOPHILS: 0.7 % (ref 0.0–1.4)
BKR WAM EOSINOPHIL ABSOLUTE COUNT.: 0.13 x 1000/ÂµL (ref 0.00–1.00)
BKR WAM EOSINOPHILS: 1.9 % (ref 0.0–5.0)
BKR WAM HEMATOCRIT (2 DEC): 32.3 % — ABNORMAL LOW (ref 38.50–50.00)
BKR WAM HEMOGLOBIN: 10.7 g/dL — ABNORMAL LOW (ref 13.2–17.1)
BKR WAM IMMATURE GRANULOCYTES: 0.4 % (ref 0.0–1.0)
BKR WAM LYMPHOCYTES: 9.8 % — ABNORMAL LOW (ref 17.0–50.0)
BKR WAM MCH (PG): 32.5 pg (ref 27.0–33.0)
BKR WAM MCHC: 33.1 g/dL (ref 31.0–36.0)
BKR WAM MCV: 98.2 fL (ref 80.0–100.0)
BKR WAM MONOCYTE ABSOLUTE COUNT.: 0.73 x 1000/ÂµL (ref 0.00–1.00)
BKR WAM MONOCYTES: 10.7 % (ref 4.0–12.0)
BKR WAM MPV: 10 fL (ref 8.0–12.0)
BKR WAM NEUTROPHILS: 76.5 % — ABNORMAL HIGH (ref 39.0–72.0)
BKR WAM NUCLEATED RED BLOOD CELLS: 0 % (ref 0.0–1.0)
BKR WAM PLATELETS: 203 x1000/ÂµL (ref 150–420)
BKR WAM RDW-CV: 20.2 % — ABNORMAL HIGH (ref 11.0–15.0)
BKR WAM RED BLOOD CELL COUNT.: 3.29 M/ÂµL — ABNORMAL LOW (ref 4.00–6.00)
BKR WAM WHITE BLOOD CELL COUNT: 6.8 x1000/ÂµL (ref 4.0–11.0)

## 2022-11-17 LAB — RETICULOCYTES
BKR WAM IRF: 10.5 % (ref 3.0–15.9)
BKR WAM RETICULOCYTE - ABS (3 DEC): 0.051 10Ë6 cells/uL (ref 0.023–0.140)
BKR WAM RETICULOCYTE COUNT PCT (1 DEC): 1.6 % (ref 0.6–2.7)
BKR WAM RETICULOCYTE HGB EQUIVALENT: 36.3 pg — ABNORMAL HIGH (ref 28.2–35.7)

## 2022-11-17 LAB — PHOSPHORUS     (BH GH L LMW YH): BKR PHOSPHORUS: 2.4 mg/dL (ref 2.2–4.5)

## 2022-11-17 LAB — HAPTOGLOBIN: BKR HAPTOGLOBIN: 229 mg/dL — ABNORMAL HIGH (ref 30–200)

## 2022-11-17 LAB — LACTATE DEHYDROGENASE: BKR LACTATE DEHYDROGENASE: 229 U/L (ref 122–241)

## 2022-11-17 LAB — TRIGLYCERIDES: BKR TRIGLYCERIDES: 146 mg/dL

## 2022-11-17 LAB — PT/INR AND PTT (BH GH L LMW YH)
BKR INR: 1.09 (ref 0.86–1.13)
BKR PARTIAL THROMBOPLASTIN TIME: 26.8 s (ref 23.0–31.0)
BKR PROTHROMBIN TIME: 11.9 s (ref 9.6–12.3)

## 2022-11-17 LAB — MAGNESIUM: BKR MAGNESIUM: 1.5 mg/dL — ABNORMAL LOW (ref 1.7–2.4)

## 2022-11-17 LAB — CYTOMEGALOVIRUS QUANTITATIVE PCR, PLASMA     (BH GH L LMW YH): BKR CMV QUANTITATIVE PCR: NOT DETECTED

## 2022-11-17 LAB — TACROLIMUS LEVEL     (BH GH L LMW YH): BKR TACROLIMUS BLOOD: 8.5 ng/mL

## 2022-11-17 MED ORDER — ALBUTEROL SULFATE HFA 90 MCG/ACTUATION AEROSOL INHALER
90 | Freq: Once | RESPIRATORY_TRACT | Status: CP
Start: 2022-11-17 — End: ?
  Administered 2022-11-17: 15:00:00 90 mcg/actuation via RESPIRATORY_TRACT

## 2022-11-17 MED ORDER — PENTAMIDINE FOR INHALATION SOLUTION
Freq: Once | RESPIRATORY_TRACT | Status: CP
Start: 2022-11-17 — End: ?
  Administered 2022-11-17: 15:00:00 6.000 mL via RESPIRATORY_TRACT

## 2022-11-17 NOTE — Progress Notes
 Patient Day +54 MUD for CMML. Douglas [name] reports feeling shoulder discomfort and limited mobility after he tripped going up the stairs last week. States diarrhea has improved.Denies fevers, chills, dizziness, bleeding, rash, n/v/d. Labs drawn via [facility]. Complete Blood CountResults in Past 7 DaysResult Component Current Result Hematocrit 32.30 (L) (11/17/2022) Hemoglobin 10.7 (L) (11/17/2022) MCH 32.5 (11/17/2022) MCHC 33.1 (11/17/2022) MCV 98.2 (11/17/2022) MPV 10.0 (11/17/2022) Platelets 203 (11/17/2022) RBC 3.29 (L) (11/17/2022) WBC 6.8 (11/17/2022) No transfusions required. Mag 1.5- 2g IV magnesium given. Pentamadine administered by respiratory therapist. Hickman dressing changed per protocol. Site within normal limits with no signs of infection. Patient seen by Amy, APRN. No changes at this time. He ambulated out of clinic with his spouse. RTC 10/8.

## 2022-11-17 NOTE — Progress Notes
 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 Overall he is feeling well, however, he is having right shoulder pain that started a few days after he fell on 11/08/22. No chest pain, heart palpitations, cough or shortness of breath. No nausea or vomiting. Eating 50% of his normal. No diarrhea or constipation. Voiding without difficulty. No rash, fever or chills. Notes that his right shoulder started hurting a few day after he fell 11/08/22. He can still move it with discomfort. He has been applying heat and taking tylenol. Review of Systems Constitutional:  Positive for malaise/fatigue. Negative for chills, fever and weight loss. HENT: Negative.  Negative for sore throat.  Eyes: Negative.  Respiratory: Negative.  Negative for cough, sputum production and shortness of breath.  Cardiovascular:  Negative for chest pain, palpitations and leg swelling. Gastrointestinal:  Negative for abdominal pain, constipation, diarrhea, nausea and vomiting.      Estimates 50% Genitourinary: Negative.  Negative for dysuria. Musculoskeletal:  Positive for falls (11/08/22).      Working with PT - ambulating now with cane instead of walker Skin: Negative.  Negative for itching and rash. Neurological:  Positive for tremors (Shakiness of hands.). Negative for dizziness, weakness and headaches. Psychiatric/Behavioral: Negative.   VoriconazoleCurrent Outpatient Medications on File Prior to Visit Medication Sig Dispense Refill  acyclovir (ZOVIRAX) 400 mg tablet Take 1 tablet (400 mg total) by mouth 2 (two) times daily. 60 tablet 11  amLODIPine (NORVASC) 10 mg tablet Take 1 tablet (10 mg total) by mouth daily.    finasteride (PROSCAR) 5 mg tablet TAKE 1 TABLET BY MOUTH DAILY FOR ENLARGED PROSTATE WITH URINATION PROBLEM TAKE WITH OR WITHOUT MEALS    folic acid (FOLVITE) 1 mg tablet Take 1 tablet (1 mg total) by mouth daily. 30 tablet 11  isavuconazonium (CRESEMBA) 186 mg capsule Take 2 capsules (372 mg total) by mouth daily. 56 capsule 11  latanoprost (XALATAN) 0.005 % ophthalmic solution Place 1 drop into both eyes nightly.    magnesium oxide 400 mg magnesium Cap Take 400 mg by mouth daily.    Miscellaneous Medical Supply Please provide rolling walker, Use as directed. 1 each 0  multivitamin tablet Take 1 tablet by mouth daily. 30 tablet 11  ondansetron (ZOFRAN) 8 mg tablet Take 1 tablet (8 mg total) by mouth every 8 (eight) hours as needed for nausea or vomiting. 30 tablet 2  pentamidine (PENTAM) 300 mg inhalation solution Inhale 6 mLs (300 mg total) into the lungs every 28 days. Last given 10/16/22    prochlorperazine (COMPAZINE) 10 mg tablet Take 1 tablet (10 mg total) by mouth every 6 (six) hours as needed for nausea. 30 tablet 3  sodium chloride 0.9 % flush Use 10 mLs by IV Push route daily. In both lumens 600 mL 11  tacrolimus (PROGRAF) 0.5 mg Immediate Release capsule Take 1 capsule (0.5 mg total) by mouth 2 (two) times daily. (Patient taking differently: Take 1 capsule (0.5 mg total) by mouth 2 (two) times daily. Total dose: 6.5  mg BID) 60 capsule 11  tacrolimus (PROGRAF) 1 mg immediate release capsule Take 1 capsule (1 mg total) by mouth 2 (two) times daily. (Patient taking differently: Take 1 capsule (1 mg total) by  mouth 2 (two) times daily. Total dose: 6.5mg  BID) 60 capsule 11  tacrolimus (PROGRAF) 5 mg Immediate Release capsule Take 1 capsule (5 mg total) by mouth 2 (two) times daily. (Patient taking differently: Take 1 capsule (5 mg total) by mouth 2 (two) times daily. Total dose: 6.5mg  BID) 60 capsule 11  ursodioL (URSO FORTE) 500 mg tablet Take 1 tablet (500 mg total) by mouth 2 times daily (9am, 5pm). 60 tablet 6  [DISCONTINUED] venetoclax (VENCLEXTA) 100 mg tablet Take 4 tablets (400 mg total) by mouth once daily. Take with food. Swallow whole, do not crush. 120 tablet 0 Current Facility-Administered Medications on File Prior to Visit Medication Dose Route Frequency Provider Last Rate Last Admin  [COMPLETED] albuterol sulfate 90 mcg/actuation HFA aerosol inhaler 2 puff  2 puff Inhalation Once [place] Barile, APRN   2 puff at 11/17/22 1042  [COMPLETED] pentamidine 300 mg INHALATION solution  300 mg Nebulization Once [name], APRN   300 mg at 11/17/22 1043 BP: 115/74 (11/17/2022  9:29 AM), Pulse: (!) 98 (11/17/2022  9:29 AM), Temp: 98.3 ?F (36.8 ?C) (11/17/2022  9:29 AM), Resp: 18 (11/17/2022  9:29 AM), SpO2: 100 % (11/17/2022  9:29 AM), Pain Score: Zero (11/17/2022  9:25 AM), Oncology Patient Distress Scale: 0 (11/17/2022 10:50 AM), Weight: 94.3 kg (11/17/2022  9:25 AM), Weight (lbs): 207.89 (11/17/2022  9:25 AM)Wt Readings from Last 3 Encounters: 11/17/22 94.3 kg 11/10/22 96 kg 11/03/22 97.3 kg Physical ExamConstitutional:     Appearance: Normal appearance. He is not ill-appearing. HENT:    Head: Normocephalic.    Nose: Nose normal. No congestion.    Mouth/Throat:    Mouth: Mucous membranes are moist.    Pharynx: Oropharynx is clear. Eyes:    General: No scleral icterus.      Right eye: No discharge.       Left eye: No discharge. Cardiovascular:    Rate and Rhythm: Normal rate and regular rhythm.    Pulses: Normal pulses.    Heart sounds: Normal heart sounds. No murmur heard.Pulmonary:    Effort: Pulmonary effort is normal. No respiratory distress.    Breath sounds: Normal breath sounds. No wheezing or rales. Abdominal:    General: Abdomen is flat. Bowel sounds are normal. There is no distension.    Palpations: Abdomen is soft.    Tenderness: There is no abdominal tenderness. There is no guarding. Musculoskeletal:       General: No swelling.    Right lower leg: No edema.    Left lower leg: No edema. Skin:   General: Skin is warm.    Findings: No erythema or rash. Neurological:    Mental Status: He is alert and oriented to person, place, and time. Psychiatric:       Behavior: Behavior normal. Lab Results Component Value Date/Time  WBC 6.8 11/17/2022 09:40 AM  HGB 10.7 (L) 11/17/2022 09:40 AM  HCT 32.30 (L) 11/17/2022 09:40 AM  PLT 203 11/17/2022 09:40 AM  Lab Results Component Value Date/Time  NEUTROPHILS 76.5 (H) 11/17/2022 09:40 AM  Lab Results Component Value Date/Time  NA 140 11/17/2022 09:40 AM  K 3.9 11/17/2022 09:40 AM  CL 106 11/17/2022 09:40 AM  CO2 21 11/17/2022 09:40 AM  BUN 16 11/17/2022 09:40 AM  CREATININE 1.13 11/17/2022 09:40 AM  GLU 149 (H) 11/17/2022 09:40 AM  ANIONGAP 13 11/17/2022 09:40 AM  Lab Results Component Value Date/Time  CALCIUM 9.3 11/17/2022 09:40 AM  MG 1.5 (L) 11/17/2022 09:40 AM  PHOS 2.4 11/17/2022  09:40 AM  Lab Results Component Value Date/Time  ALT 18 11/17/2022 09:40 AM  AST 15 11/17/2022 09:40 AM  ALKPHOS 95 11/17/2022 09:40 AM  BILITOT 0.4 11/17/2022 09:40 AM  BILIDIR 0.2 09/11/2022 12:31 PM  ALBUMIN 3.8 11/17/2022 09:40 AM  Lab Results Component Value Date/Time  PTT 26.8 11/17/2022 09:40 AM  LABPROT 11.9 11/17/2022 09:40 AM  INR 1.09 11/17/2022 09:40 AM  Results in Past 7 DaysResult Component Current Result LD 229 (11/17/2022) No results found for: RAPAMYCINResults in Past 7 DaysResult Component Current Result Tacrolimus Lvl 8.5 (11/17/2022) Assessment:Douglas [name] is a 72 y.o. 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 He is s/p 2 cycles of chemotherapy with Decitabine + Venetoclax with reduction in blast count. 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 He is engrafted on day +13 and is discharged on 10/10/22. Plan: Day + 54. No s/sx of GVHD. Tac therapeutic.  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 Needs BM Bx ~ 100, 180 and 365Heme:  No transfusions needed.Transfuse to maintain Hgb > 7 and plts >/=20 or if clinically indicatedInfectious Disease: Prophylactic Antimicrobials of Acyclovir, Cresemba and  Pentam Inhalation given last on 11/17/22 and will be repeated every 3-4 weeks Identified as CMV Risk: check periodically - not detected on 9/24/24GVHD Prophylaxis: No s/sxPost Transplant Cy 25mg /kg IV daily on day 3 and 4 (dose reduced by 50% ) Tacrolimus 6.5mg  BID.  Therapeutic goal of 6-11ng/ml.  Level 8.5 ng/ml.  Continue current dosing.No MMF with 10/10Monitor Triglycerides, Haptoglobin and LDH weekly on Tacrolimus (146/229/229)CV: HTN: Norvasc to 10 mg po dailyVOD Prophylaxis: No s/sxContinue Actigall 500mg  PO BID until day + weight, LFT's and coags frequentlyFluids, Electrolytes and Nutrition:Replete electrolytes PRNDiet - safe food handlingDaily MTV, Folic Acid and MagnesiumMSK:Right shoulder pain, full ROM with pain. Larey Seat on 11/08/22). Will continue to ice and take tylenol. Also recommended voltaren topical cream. CVAD: [place] for chemotherapy and supportive carePost Transplant immune reconstitution/vaccines: Date   Day   T cell subset   IGG   ~100     ~180     ~1 year   Plan on re-vaccinating at 1 year unless remain on immunosuppression. Vaccines : 12, 14 , 16 and 24 months s/p transplantHealth Maintenance Day +30 Day +100 Day +180 1 Year Heme/Immune       T cell subsets/IGG       Chimerism/Bone Marrow or PET/Imaging [x]  [] []  [] []  [] []  Pulmonary       PFT's  []  []  []  Cardiology       Echo       EKG   []  Only if cardiac risk factors  [] []  [] []  Endocrine       TSH/TFT's       Fasting Lipids       Glucose/A1c       Vit D/Calcium        Bone Density (women and those on steroids)Reproductive Endocrine        FSH/LH/Testosterone (both men & women)   []  [] [] [] [] [] [] []  Liver      Ferritin      MRI T2* quant (if ferritin abnormal)    [] []  Ophthalmology       Sicca/Cataracts   []  []  Dermatology/musculo-skeletal  GVHD (skin & ROM)        Cancer screening (Derm referral)            [] []  [] []  Dental       GVHD       Routine Care   []  []              Majhail, et al. (2012). Recommended Screening and Preventive Practices for Long-Term Survivors after                    Hematopoietic Cell Transplantation. Biology of Blood and Marrow Transplantation, Volume 18,                    Issue 3, March 2012, Pages 348-371.Conditioning/Transplant:-Melphalan 70 mg/m2 over 30 minutes on D-6, *Melphalan dosed reduced to 70 mg/m2 due to age-Fludarabine 40 mg/m2 over 30 minutes every 24hr for 4 doses from D-5 to D -2             -Stem cell infusion day 0 (09/24/22)Follow up on 10/8 with Dr. Valentina Shaggy, [id number] [phone] or [facility] Barile-PuglieseOn the day of this patient's encounter, a total of >40 minutes was personally spent by me.  This does not include any any time spent performing a procedural service.

## 2022-11-20 ENCOUNTER — Ambulatory Visit: Admit: 2022-11-20 | Payer: MEDICARE

## 2022-11-20 ENCOUNTER — Ambulatory Visit: Admit: 2022-11-20 | Payer: MEDICARE | Attending: Medical Oncology

## 2022-11-21 ENCOUNTER — Encounter: Admit: 2022-11-21 | Payer: PRIVATE HEALTH INSURANCE | Attending: Hematology & Oncology

## 2022-11-21 DIAGNOSIS — Z9484 Stem cells transplant status: Secondary | ICD-10-CM

## 2022-11-21 DIAGNOSIS — C9200 Acute myeloblastic leukemia, not having achieved remission: Secondary | ICD-10-CM

## 2022-11-24 ENCOUNTER — Ambulatory Visit: Admit: 2022-11-24 | Payer: MEDICARE | Attending: Medical Oncology

## 2022-11-24 ENCOUNTER — Ambulatory Visit: Admit: 2022-11-24 | Payer: MEDICARE | Attending: Oncology

## 2022-11-24 ENCOUNTER — Inpatient Hospital Stay: Admit: 2022-11-24 | Discharge: 2022-11-24 | Payer: MEDICARE

## 2022-11-24 ENCOUNTER — Encounter: Admit: 2022-11-24 | Payer: PRIVATE HEALTH INSURANCE

## 2022-11-24 DIAGNOSIS — C4491 Basal cell carcinoma of skin, unspecified: Secondary | ICD-10-CM

## 2022-11-24 DIAGNOSIS — N400 Enlarged prostate without lower urinary tract symptoms: Secondary | ICD-10-CM

## 2022-11-24 DIAGNOSIS — H409 Unspecified glaucoma: Secondary | ICD-10-CM

## 2022-11-24 DIAGNOSIS — I10 Essential (primary) hypertension: Secondary | ICD-10-CM

## 2022-11-24 DIAGNOSIS — Z9484 Stem cells transplant status: Secondary | ICD-10-CM

## 2022-11-24 DIAGNOSIS — F32A Depression: Secondary | ICD-10-CM

## 2022-11-24 DIAGNOSIS — C9200 Acute myeloblastic leukemia, not having achieved remission: Secondary | ICD-10-CM

## 2022-11-24 DIAGNOSIS — C931 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: Secondary | ICD-10-CM

## 2022-11-24 DIAGNOSIS — I119 Hypertensive heart disease without heart failure: Secondary | ICD-10-CM

## 2022-11-24 DIAGNOSIS — F419 Anxiety disorder, unspecified: Secondary | ICD-10-CM

## 2022-11-24 DIAGNOSIS — R0609 Other forms of dyspnea: Secondary | ICD-10-CM

## 2022-11-24 DIAGNOSIS — C9110 Chronic lymphocytic leukemia of B-cell type not having achieved remission: Secondary | ICD-10-CM

## 2022-11-24 DIAGNOSIS — Z8249 Family history of ischemic heart disease and other diseases of the circulatory system: Secondary | ICD-10-CM

## 2022-11-24 DIAGNOSIS — E782 Mixed hyperlipidemia: Secondary | ICD-10-CM

## 2022-11-24 DIAGNOSIS — Z9481 Bone marrow transplant status: Secondary | ICD-10-CM

## 2022-11-24 LAB — COMPREHENSIVE METABOLIC PANEL
BKR A/G RATIO: 1.5 (ref 1.0–2.2)
BKR ALANINE AMINOTRANSFERASE (ALT): 21 U/L (ref 9–59)
BKR ALBUMIN: 4.1 g/dL (ref 3.6–5.1)
BKR ALKALINE PHOSPHATASE: 91 U/L (ref 9–122)
BKR ANION GAP: 13 (ref 7–17)
BKR ASPARTATE AMINOTRANSFERASE (AST): 20 U/L (ref 10–35)
BKR AST/ALT RATIO: 1
BKR BILIRUBIN TOTAL: 0.3 mg/dL (ref <=1.2)
BKR BLOOD UREA NITROGEN: 16 mg/dL (ref 8–23)
BKR BUN / CREAT RATIO: 15.2 (ref 8.0–23.0)
BKR CALCIUM: 9.3 mg/dL (ref 8.8–10.2)
BKR CHLORIDE: 105 mmol/L (ref 98–107)
BKR CO2: 20 mmol/L (ref 20–30)
BKR CREATININE: 1.05 mg/dL (ref 0.40–1.30)
BKR EGFR, CREATININE (CKD-EPI 2021): >60 mL/min/{1.73_m2} (ref >=60)
BKR GLOBULIN: 2.7 g/dL (ref 2.0–3.9)
BKR GLUCOSE: 139 mg/dL — ABNORMAL HIGH (ref 70–100)
BKR POTASSIUM: 3.9 mmol/L (ref 3.3–5.3)
BKR PROTEIN TOTAL: 6.8 g/dL (ref 5.9–8.3)
BKR SODIUM: 138 mmol/L (ref 136–144)

## 2022-11-24 LAB — PHOSPHORUS     (BH GH L LMW YH): BKR PHOSPHORUS: 2.3 mg/dL (ref 2.2–4.5)

## 2022-11-24 LAB — CBC WITH AUTO DIFFERENTIAL
BKR WAM ABSOLUTE IMMATURE GRANULOCYTES.: 0 x 1000/ÂµL (ref 0.00–0.30)
BKR WAM ABSOLUTE LYMPHOCYTE COUNT.: 0.7 x 1000/ÂµL (ref 0.60–3.70)
BKR WAM ABSOLUTE NRBC (2 DEC): 0 x 1000/ÂµL (ref 0.00–1.00)
BKR WAM ANC (ABSOLUTE NEUTROPHIL COUNT): 1.67 x 1000/ÂµL — ABNORMAL LOW (ref 2.00–7.60)
BKR WAM BASOPHIL ABSOLUTE COUNT.: 0.04 x 1000/ÂµL (ref 0.00–1.00)
BKR WAM BASOPHILS: 1.3 % (ref 0.0–1.4)
BKR WAM EOSINOPHIL ABSOLUTE COUNT.: 0.14 x 1000/ÂµL (ref 0.00–1.00)
BKR WAM EOSINOPHILS: 4.7 % (ref 0.0–5.0)
BKR WAM HEMATOCRIT (2 DEC): 33.6 % — ABNORMAL LOW (ref 38.50–50.00)
BKR WAM HEMOGLOBIN: 11.3 g/dL — ABNORMAL LOW (ref 13.2–17.1)
BKR WAM IMMATURE GRANULOCYTES: 0 % (ref 0.0–1.0)
BKR WAM LYMPHOCYTES: 23.6 % (ref 17.0–50.0)
BKR WAM MCH (PG): 33.1 pg — ABNORMAL HIGH (ref 27.0–33.0)
BKR WAM MCHC: 33.6 g/dL (ref 31.0–36.0)
BKR WAM MCV: 98.5 fL (ref 80.0–100.0)
BKR WAM MONOCYTE ABSOLUTE COUNT.: 0.42 x 1000/ÂµL (ref 0.00–1.00)
BKR WAM MONOCYTES: 14.1 % — ABNORMAL HIGH (ref 4.0–12.0)
BKR WAM MPV: 10.3 fL (ref 8.0–12.0)
BKR WAM NEUTROPHILS: 56.3 % (ref 39.0–72.0)
BKR WAM NUCLEATED RED BLOOD CELLS: 0 % (ref 0.0–1.0)
BKR WAM PLATELETS: 181 x1000/ÂµL (ref 150–420)
BKR WAM RDW-CV: 18.6 % — ABNORMAL HIGH (ref 11.0–15.0)
BKR WAM RED BLOOD CELL COUNT.: 3.41 M/ÂµL — ABNORMAL LOW (ref 4.00–6.00)
BKR WAM WHITE BLOOD CELL COUNT: 3 x1000/ÂµL — ABNORMAL LOW (ref 4.0–11.0)

## 2022-11-24 LAB — RETICULOCYTES
BKR WAM IRF: 7.7 % (ref 3.0–15.9)
BKR WAM RETICULOCYTE - ABS (3 DEC): 0.027 10Ë6 cells/uL (ref 0.023–0.140)
BKR WAM RETICULOCYTE COUNT PCT (1 DEC): 0.8 % (ref 0.6–2.7)
BKR WAM RETICULOCYTE HGB EQUIVALENT: 38.3 pg — ABNORMAL HIGH (ref 28.2–35.7)

## 2022-11-24 LAB — PT/INR AND PTT (BH GH L LMW YH)
BKR INR: 1.08 (ref 0.86–1.13)
BKR PARTIAL THROMBOPLASTIN TIME: 26.2 s (ref 23.0–31.0)
BKR PROTHROMBIN TIME: 11.8 s (ref 9.6–12.3)

## 2022-11-24 LAB — LACTATE DEHYDROGENASE: BKR LACTATE DEHYDROGENASE: 209 U/L (ref 122–241)

## 2022-11-24 LAB — TRIGLYCERIDES: BKR TRIGLYCERIDES: 189 mg/dL — ABNORMAL HIGH

## 2022-11-24 LAB — MAGNESIUM: BKR MAGNESIUM: 1.5 mg/dL — ABNORMAL LOW (ref 1.7–2.4)

## 2022-11-24 LAB — HAPTOGLOBIN: BKR HAPTOGLOBIN: 241 mg/dL — ABNORMAL HIGH (ref 30–200)

## 2022-11-24 LAB — TACROLIMUS LEVEL     (BH GH L LMW YH): BKR TACROLIMUS BLOOD: 7.1 ng/mL

## 2022-11-24 MED ORDER — ACYCLOVIR 400 MG TABLET
400 | ORAL_TABLET | Freq: Two times a day (BID) | ORAL | 4 refills | Status: AC
Start: 2022-11-24 — End: ?

## 2022-11-24 MED ORDER — MAGNESIUM SULFATE 2 GRAM/50 ML (4 %) IN WATER INTRAVENOUS PIGGYBACK
2 | Freq: Once | INTRAVENOUS | Status: CP
Start: 2022-11-24 — End: ?
  Administered 2022-11-24: 15:00:00 2 mL/h via INTRAVENOUS

## 2022-11-24 NOTE — Other
 Post-HSCT Follow-Up Pharmacist note Diagnosis: CMMLConditioning:   Fludarabine/Melphalan with post-transplant Cyclophosphamide Transplant Type:10/10 MUD Allogeneic PBSCTTransplant Date (Day 0): 09/24/22 Referring Provider: Chrystine Oiler, MDEncounter Date: 11/24/2022 = D+61[name] is a 72 y.o. year-old male with history of CMML s/p HSCT. Patient is being seen by the clinic pharmacist for medication management included post stem cell transplant, hematological related disorders and GI related disorders .  This visit was completed as a joint-visit with provider present and is aware of the care plan.Subjective: Complains of intermittent nausea and diarrhea, takes ondansetron with good effect. No signs of skin rashHaving shoulder pain since recent fall, taking acetaminophen and using heating pads with good effect. Inquiring if he can use aleveRequesting refill of folic acidHeld tacrolimus prior to morning labs Assessment and Recommendations:  Summary of Medication Changes and Follow-up Items Discussed with Provider:Counseled against using any NSAIDS including Aleve, Motrin, Naproxen, Ibuprofen, etc. Patient to use acetaminophen and instruct [place] if this doesn't control the painGVHD (immunosuppression)Tacrolimus: Current dose: 5 mg PO twice daily10/08/24  level: 7.1, continue current doseGoal range: 6-11 ng/mLDosage dispensed: 0.5mg , 1mg , 5mg  capsulesPharmacy dispensed: OPSPost transplant cyclophosphamide: completed inpatient *All immunosuppression is managed by the bone marrow transplant team.Infectious Disease (prophylaxis, monitoring, and active infections)ViralCurrent prophylaxis/treatment: Acyclovir 400mg  PO twice dailyPrevious viral infections: 	HSV/VZV +/?CMV -/?, 9/17 CMV negativeEBV IgG negativeBK virus N/APneumocystis jeuvecii pneumoniaCurrent prophylaxis/treatment: Pentamidine 300mg  inhaled monthly last given 10/1/24Previous PJP infection: N/AT-cell subsetDay +100 (date):Day +180 (date):1 year (date):Toxoplasmosis IgG negativeCurrent prophylaxis/treatment: NoneFungal/MoldCurrent prophylaxis/treatment: Isavuconazole 372mg  PO daily - history of prolonged cytopenias Previous fungal/mold infection: N/A, Hx of gingival changes to voriconazole Drug monitoring: N/AGalactomann - 10/01/22 negFungitell - 10/01/22 < 31Bacterial	Current prophylaxis/treatment: None	Previous bacterial infection: N/A	Drug monitoring: N/A	Blood cultures: N/ASupportive careHyperlipidemia monitoring (triglycerides, Cholesterol, LDL, HDL)Current treatment: None	Prior treatment: Atorvastatin 40mg  PO daily, held with transplantBaseline (09/18/2022): 68 Today (11/24/22): 189Hypertension monitoring (blood pressure)Current treatment: Amlodipine 10mg  PO dailyPrior treatment: Metoprolol and lisinopril on hold since transplant Today (11/24/22):  134/85GastrointestionalStress ulcer prophylaxis: None, one episode of acid refluxNausea/Vomiting: Intermittent nausea, takes ondansetron with good effect. Doesn't find that prochlorperazine has much benefit.Diarrhea/constipation: Continues to have soft stools, loperamidine prnOral care and mucositis: NoneVeno-occlusive diseaseCurrent treatment:  Ursodiol 500mg  PO twice daily until day +90Anticoagulation	Current prophylaxis/treatment: None	Previous anticoagulation history: Aspirin 81mg  PO daily, for cardiac ppx - not restartingElectrolyte supplementation:	Current treatment: Magnesium oxide 400mg  BID	Monitoring: Mg 1.5 10/08/24Hypogammaglobulinemia	Current treatment:	Goal: 500-800 ng/dLIVIG last dose:Baseline IgG level:Last IgG level:IgG scheduled levelsDay +100 (date):Day +180 (date):	1 year (date):	Maintenance Therapy Plan: VaccinationsBaseline titers: 	Hepatitis B: 	Pneumococcal: 	VZV 	Measles: , Mumps: , Rubella: 12 month: 14 month: 18 month:	Hep B titer	Pneumococcal titers24 month:Titers drawn 2 months after 24 months:Redose needed:Medication History:Douglas [name] presents to today's visit with their med action plan.  A medication history was obtained from the patient and spouse.Adherence:The patient reports 0 missed doses of medications in the past 1 week(s). The patient does not report the barriers to adherence. Discussed potential outcomes associated with non-adherenceAffordability:The patient denies concerns with affordability of medications at this time. Medication Management:Plan for medication management assistance: An updated medication action plan was given to the patientPatient would benefit from continued pharmacy follow-up at future clinic visits for medication managementEducated patient on the above changesPatient did request refill on folic acid todayObjective: WUJ:WJXBJY Results (from the past 168 hour(s)) CBC auto differential  Collection Time: 11/24/22 10:22 AM Result Value Ref Range  WBC 3.0 (L) 4.0 - 11.0 x1000/?L  RBC 3.41 (L) 4.00 - 6.00 M/?L  Hemoglobin 11.3 (L) 13.2 - 17.1 g/dL  Hematocrit 78.29 (L) 56.21 - 50.00 %  MCV 98.5 80.0 - 100.0 fL  MCH 33.1 (H) 27.0 - 33.0 pg  MCHC 33.6 31.0 - 36.0 g/dL  RDW-CV 16.1 (H) 09.6 - 15.0 %  Platelets 181 150 - 420 x1000/?L  MPV 10.3 8.0 - 12.0 fL  Neutrophils 56.3 39.0 - 72.0 %  Lymphocytes 23.6 17.0 - 50.0 %  Monocytes 14.1 (H) 4.0 - 12.0 %  Eosinophils 4.7 0.0 - 5.0 %  Basophil 1.3 0.0 - 1.4 %  Immature Granulocytes 0.0 0.0 - 1.0 %  nRBC 0.0 0.0 - 1.0 %  Absolute Lymphocyte Count 0.70 0.60 - 3.70 x 1000/?L  Monocyte Absolute Count 0.42 0.00 - 1.00 x 1000/?L  Eosinophil Absolute Count 0.14 0.00 - 1.00 x 1000/?L  Basophil Absolute Count 0.04 0.00 - 1.00 x 1000/?L  Absolute Immature Granulocyte Count 0.00 0.00 - 0.30 x 1000/?L  Absolute nRBC 0.00 0.00 - 1.00 x 1000/?L  ANC (Abs Neutrophil Count) 1.67 (L) 2.00 - 7.60 x 1000/?L EAV:WUJWJX Results (from the past 168 hour(s)) Comprehensive metabolic panel  Collection Time: 11/24/22 10:22 AM Result Value Ref Range  Sodium 138 136 - 144 mmol/L  Potassium 3.9 3.3 - 5.3 mmol/L  Chloride 105 98 - 107 mmol/L  CO2 20 20 - 30 mmol/L  Anion Gap 13 7 - 17  Glucose 139 (H) 70 - 100 mg/dL  BUN 16 8 - 23 mg/dL  Creatinine 9.14 7.82 - 1.30 mg/dL  Calcium 9.3 8.8 - 95.6 mg/dL  BUN/Creatinine Ratio 21.3 8.0 - 23.0  Total Protein 6.8 5.9 - 8.3 g/dL  Albumin 4.1 3.6 - 5.1 g/dL  Total Bilirubin 0.3 <=0.8 mg/dL  Alkaline Phosphatase 91 9 - 122 U/L  Alanine Aminotransferase (ALT) 21 9 - 59 U/L  Aspartate Aminotransferase (AST) 20 10 - 35 U/L  Globulin 2.7 2.0 - 3.9 g/dL  A/G Ratio 1.5 1.0 - 2.2  AST/ALT Ratio 1.0 Reference Range Not Established  eGFR (Creatinine) >60 >=60 mL/min/1.44m2 LDH: Recent Results (from the past 168 hour(s)) Lactate dehydrogenase  Collection Time: 11/24/22 10:22 AM Result Value Ref Range  LD 209 122 - 241 U/L Patient Education: The following education was provided for: Initiation, modification, or discontinuation of drug therapy, Assessment of efficacy and safety of drug therapy, and Adverse effect presentation, evaluation, and monitoring during today?s visit to the patient and spouse regarding medication management: purpose of each medication, side effects of medications, and importance of adherence with the regimen(s) Time Spent: 15 minutes, face-to-face consultPATIENT AND/OR CAREGIVER VERBALIZED UNDERSTANDING OF CARE PLAN: yesPATIENT ADVISED TO CALL BACK WITH QUESTIONS, CONCERNS, OR CHANGE IN SYMPTOMS.Signed by: Vevelyn Francois, PharmD, BCOP10/08/24 11:05 AM

## 2022-11-24 NOTE — Progress Notes
 Day + 61 post MUD for his AML.Reports that his right shoulder pain is resolving.He had a fall about 2-3 weeks ago and hurt his right shoulder.Energy is improving; appetite is about 50 % of his normal.No fever/chills; no nausea/vomiting/diarrhea.Skin intact; no rash/edema noted.RDLH dressing and claves changed.labs drawn-given 1 run of magnesium 2 grams IV.Seen by MD Ar[place]a Jansky changes made at this time.RTC in one week.

## 2022-11-25 DIAGNOSIS — R197 Diarrhea, unspecified: Secondary | ICD-10-CM | POA: Diagnosis not present

## 2022-11-25 DIAGNOSIS — A049 Bacterial intestinal infection, unspecified: Secondary | ICD-10-CM | POA: Diagnosis not present

## 2022-11-25 LAB — CYTOMEGALOVIRUS QUANTITATIVE PCR, PLASMA     (BH GH L LMW YH): BKR CMV QUANTITATIVE PCR: NOT DETECTED

## 2022-11-25 MED FILL — FOLIC ACID 1 MG TABLET: 1 mg | ORAL | 30 days supply | Qty: 30 | Fill #2 | Status: CP

## 2022-11-30 MED FILL — TACROLIMUS IMMEDIATE RELEASE 5 MG CAPSULE: 5 mg | ORAL | 30 days supply | Qty: 60 | Fill #2 | Status: CP

## 2022-11-30 MED FILL — ISAVUCONAZONIUM SULFATE 186 MG CAPSULE: 186 mg | ORAL | 28 days supply | Qty: 56 | Fill #1 | Status: CP

## 2022-12-01 ENCOUNTER — Inpatient Hospital Stay: Admit: 2022-12-01 | Discharge: 2022-12-01 | Payer: MEDICARE

## 2022-12-01 ENCOUNTER — Ambulatory Visit: Admit: 2022-12-01 | Payer: MEDICARE | Attending: Oncology

## 2022-12-01 ENCOUNTER — Encounter: Admit: 2022-12-01 | Payer: PRIVATE HEALTH INSURANCE

## 2022-12-01 ENCOUNTER — Ambulatory Visit: Admit: 2022-12-01 | Payer: MEDICARE

## 2022-12-01 DIAGNOSIS — R634 Abnormal weight loss: Secondary | ICD-10-CM

## 2022-12-01 DIAGNOSIS — R638 Other symptoms and signs concerning food and fluid intake: Secondary | ICD-10-CM

## 2022-12-01 DIAGNOSIS — Z5181 Encounter for therapeutic drug level monitoring: Secondary | ICD-10-CM

## 2022-12-01 DIAGNOSIS — C9110 Chronic lymphocytic leukemia of B-cell type not having achieved remission: Secondary | ICD-10-CM

## 2022-12-01 DIAGNOSIS — C9200 Acute myeloblastic leukemia, not having achieved remission: Secondary | ICD-10-CM

## 2022-12-01 DIAGNOSIS — Z79624 Long term (current) use of inhibitors of nucleotide synthesis: Secondary | ICD-10-CM

## 2022-12-01 DIAGNOSIS — I10 Essential (primary) hypertension: Secondary | ICD-10-CM

## 2022-12-01 DIAGNOSIS — Z9484 Stem cells transplant status: Secondary | ICD-10-CM

## 2022-12-01 DIAGNOSIS — F32A Depression: Secondary | ICD-10-CM

## 2022-12-01 DIAGNOSIS — E782 Mixed hyperlipidemia: Secondary | ICD-10-CM

## 2022-12-01 DIAGNOSIS — Z8249 Family history of ischemic heart disease and other diseases of the circulatory system: Secondary | ICD-10-CM

## 2022-12-01 DIAGNOSIS — Z79899 Other long term (current) drug therapy: Secondary | ICD-10-CM

## 2022-12-01 DIAGNOSIS — C931 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: Secondary | ICD-10-CM

## 2022-12-01 DIAGNOSIS — Z79621 Long term (current) use of calcineurin inhibitor: Secondary | ICD-10-CM

## 2022-12-01 DIAGNOSIS — Z9481 Bone marrow transplant status: Secondary | ICD-10-CM

## 2022-12-01 DIAGNOSIS — F419 Anxiety disorder, unspecified: Secondary | ICD-10-CM

## 2022-12-01 DIAGNOSIS — C4491 Basal cell carcinoma of skin, unspecified: Secondary | ICD-10-CM

## 2022-12-01 DIAGNOSIS — N400 Enlarged prostate without lower urinary tract symptoms: Secondary | ICD-10-CM

## 2022-12-01 DIAGNOSIS — Z7969 Long term (current) use of other immunomodulators and immunosuppressants: Secondary | ICD-10-CM

## 2022-12-01 DIAGNOSIS — H409 Unspecified glaucoma: Secondary | ICD-10-CM

## 2022-12-01 DIAGNOSIS — I119 Hypertensive heart disease without heart failure: Secondary | ICD-10-CM

## 2022-12-01 DIAGNOSIS — R0609 Other forms of dyspnea: Secondary | ICD-10-CM

## 2022-12-01 LAB — COMPREHENSIVE METABOLIC PANEL
BKR A/G RATIO: 1.6 (ref 1.0–2.2)
BKR ALANINE AMINOTRANSFERASE (ALT): 26 U/L (ref 9–59)
BKR ALBUMIN: 4.2 g/dL (ref 3.6–5.1)
BKR ALKALINE PHOSPHATASE: 87 U/L (ref 9–122)
BKR ANION GAP: 13 (ref 7–17)
BKR ASPARTATE AMINOTRANSFERASE (AST): 25 U/L (ref 10–35)
BKR AST/ALT RATIO: 1
BKR BILIRUBIN TOTAL: 0.4 mg/dL (ref <=1.2)
BKR BLOOD UREA NITROGEN: 15 mg/dL (ref 8–23)
BKR BUN / CREAT RATIO: 13.3 (ref 8.0–23.0)
BKR CALCIUM: 9.4 mg/dL (ref 8.8–10.2)
BKR CHLORIDE: 106 mmol/L (ref 98–107)
BKR CO2: 21 mmol/L (ref 20–30)
BKR CREATININE: 1.13 mg/dL (ref 0.40–1.30)
BKR EGFR, CREATININE (CKD-EPI 2021): >60 mL/min/{1.73_m2} (ref >=60)
BKR GLOBULIN: 2.6 g/dL (ref 2.0–3.9)
BKR GLUCOSE: 122 mg/dL — ABNORMAL HIGH (ref 70–100)
BKR POTASSIUM: 4 mmol/L (ref 3.3–5.3)
BKR PROTEIN TOTAL: 6.8 g/dL (ref 5.9–8.3)
BKR SODIUM: 140 mmol/L (ref 136–144)

## 2022-12-01 LAB — LACTATE DEHYDROGENASE: BKR LACTATE DEHYDROGENASE: 249 U/L — ABNORMAL HIGH (ref 122–241)

## 2022-12-01 LAB — RETICULOCYTES
BKR WAM IRF: 10.8 % (ref 3.0–15.9)
BKR WAM RETICULOCYTE - ABS (3 DEC): 0.044 10Ë6 cells/uL (ref 0.023–0.140)
BKR WAM RETICULOCYTE COUNT PCT (1 DEC): 1.3 % (ref 0.6–2.7)
BKR WAM RETICULOCYTE HGB EQUIVALENT: 37.1 pg — ABNORMAL HIGH (ref 28.2–35.7)

## 2022-12-01 LAB — CBC WITH AUTO DIFFERENTIAL
BKR WAM ABSOLUTE IMMATURE GRANULOCYTES.: 0.01 x 1000/ÂµL (ref 0.00–0.30)
BKR WAM ABSOLUTE LYMPHOCYTE COUNT.: 0.79 x 1000/ÂµL (ref 0.60–3.70)
BKR WAM ABSOLUTE NRBC (2 DEC): 0 x 1000/ÂµL (ref 0.00–1.00)
BKR WAM ANC (ABSOLUTE NEUTROPHIL COUNT): 1.97 x 1000/ÂµL — ABNORMAL LOW (ref 2.00–7.60)
BKR WAM BASOPHIL ABSOLUTE COUNT.: 0.03 x 1000/ÂµL (ref 0.00–1.00)
BKR WAM BASOPHILS: 0.9 % (ref 0.0–1.4)
BKR WAM EOSINOPHIL ABSOLUTE COUNT.: 0.1 x 1000/ÂµL (ref 0.00–1.00)
BKR WAM EOSINOPHILS: 3 % (ref 0.0–5.0)
BKR WAM HEMATOCRIT (2 DEC): 33 % — ABNORMAL LOW (ref 38.50–50.00)
BKR WAM HEMOGLOBIN: 11.4 g/dL — ABNORMAL LOW (ref 13.2–17.1)
BKR WAM IMMATURE GRANULOCYTES: 0.3 % (ref 0.0–1.0)
BKR WAM LYMPHOCYTES: 23.8 % (ref 17.0–50.0)
BKR WAM MCH (PG): 33.9 pg — ABNORMAL HIGH (ref 27.0–33.0)
BKR WAM MCHC: 34.5 g/dL (ref 31.0–36.0)
BKR WAM MCV: 98.2 fL (ref 80.0–100.0)
BKR WAM MONOCYTE ABSOLUTE COUNT.: 0.42 x 1000/ÂµL (ref 0.00–1.00)
BKR WAM MONOCYTES: 12.7 % — ABNORMAL HIGH (ref 4.0–12.0)
BKR WAM MPV: 10.4 fL (ref 8.0–12.0)
BKR WAM NEUTROPHILS: 59.3 % (ref 39.0–72.0)
BKR WAM NUCLEATED RED BLOOD CELLS: 0 % (ref 0.0–1.0)
BKR WAM PLATELETS: 186 x1000/ÂµL (ref 150–420)
BKR WAM RDW-CV: 17.6 % — ABNORMAL HIGH (ref 11.0–15.0)
BKR WAM RED BLOOD CELL COUNT.: 3.36 M/ÂµL — ABNORMAL LOW (ref 4.00–6.00)
BKR WAM WHITE BLOOD CELL COUNT: 3.3 x1000/ÂµL — ABNORMAL LOW (ref 4.0–11.0)

## 2022-12-01 LAB — MAGNESIUM: BKR MAGNESIUM: 1.5 mg/dL — ABNORMAL LOW (ref 1.7–2.4)

## 2022-12-01 LAB — PT/INR AND PTT (BH GH L LMW YH)
BKR INR: 1.14 — ABNORMAL HIGH (ref 0.86–1.13)
BKR PARTIAL THROMBOPLASTIN TIME: 23.6 s (ref 23.0–31.0)
BKR PROTHROMBIN TIME: 12.4 s — ABNORMAL HIGH (ref 9.6–12.3)

## 2022-12-01 LAB — TACROLIMUS LEVEL     (BH GH L LMW YH): BKR TACROLIMUS BLOOD: 7.6 ng/mL

## 2022-12-01 LAB — PHOSPHORUS     (BH GH L LMW YH): BKR PHOSPHORUS: 2.4 mg/dL (ref 2.2–4.5)

## 2022-12-01 LAB — TRIGLYCERIDES: BKR TRIGLYCERIDES: 181 mg/dL — ABNORMAL HIGH

## 2022-12-01 LAB — HAPTOGLOBIN: BKR HAPTOGLOBIN: 159 mg/dL (ref 30–200)

## 2022-12-01 NOTE — Progress Notes
 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 Overall he is doing well. No chest pain, heart palpitations, cough or shortness of breath. No nausea, or vomiting. Eating 65% of his normal. No diarrhea or constipation, having 1-2 soft formed stools per day. Voiding without difficulty. No rash, fever or chills. Right shoulder pain is 85% resolved. Asking about returning to [place] in January. Asking about derm lesion that needs mohs surgery, colonoscopy needed, hickman removed. Review of Systems Constitutional: Negative.  Negative for chills, fever and weight loss. HENT: Negative.  Negative for sore throat.  Eyes: Negative.  Respiratory: Negative.  Negative for cough, sputum production and shortness of breath.  Cardiovascular: Negative.  Negative for chest pain, palpitations and leg swelling. Gastrointestinal:  Negative for abdominal pain, constipation, diarrhea, nausea and vomiting.      Estimates 65% Genitourinary: Negative.  Negative for dysuria. Musculoskeletal:  Positive for falls (11/08/22).      Right shoulder 85% better Skin: Negative.  Negative for itching and rash. Neurological:  Positive for tremors (Shakiness of hands.). Negative for dizziness, weakness and headaches. Psychiatric/Behavioral: Negative.   VoriconazoleCurrent Outpatient Medications on File Prior to Visit Medication Sig Dispense Refill  acyclovir (ZOVIRAX) 400 mg tablet Take 1 tablet (400 mg total) by mouth every 12 (twelve) hours. 180 tablet 3  amLODIPine (NORVASC) 10 mg tablet Take 1 tablet (10 mg total) by mouth daily.    finasteride (PROSCAR) 5 mg tablet TAKE 1 TABLET BY MOUTH DAILY FOR ENLARGED PROSTATE WITH URINATION PROBLEM TAKE WITH OR WITHOUT MEALS    folic acid (FOLVITE) 1 mg tablet Take 1 tablet (1 mg total) by mouth daily. 30 tablet 11  isavuconazonium (CRESEMBA) 186 mg capsule Take 2 capsules (372 mg total) by mouth daily. 56 capsule 11  latanoprost (XALATAN) 0.005 % ophthalmic solution Place 1 drop into both eyes nightly.    magnesium oxide 400 mg magnesium Cap Take 400 mg by mouth daily.    Miscellaneous Medical Supply Please provide rolling walker, Use as directed. 1 each 0  multivitamin tablet Take 1 tablet by mouth daily. 30 tablet 11  ondansetron (ZOFRAN) 8 mg tablet Take 1 tablet (8 mg total) by mouth every 8 (eight) hours as needed for nausea or vomiting. (Patient not taking: Reported on 12/01/2022) 30 tablet 2  pentamidine (PENTAM) 300 mg inhalation solution Inhale 6 mLs (300 mg total) into the lungs every 28 days. Last given 11/17/22    sodium chloride 0.9 % flush Use 10 mLs by IV Push route daily. In both lumens 600 mL 11  tacrolimus (PROGRAF) 5 mg Immediate Release capsule Take 1 capsule (5 mg total) by mouth 2 (two) times daily. (Patient taking differently: Take 1 capsule (5 mg total) by mouth 2 (two) times daily. 5 mg bid) 60 capsule 11  ursodioL (URSO FORTE) 500 mg tablet Take 1 tablet (500 mg total) by mouth 2 times daily (9am, 5pm). 60 tablet 6  [DISCONTINUED] venetoclax (VENCLEXTA) 100 mg tablet Take 4 tablets (400 mg total) by mouth once daily. Take with food. Swallow whole, do not crush. 120 tablet 0 No current facility-administered medications on file prior to visit. BP: 133/83 (12/01/2022  9:27 AM), Pulse: 86 (12/01/2022  9:27 AM), Temp: 98.3 ?F (36.8 ?  C) (12/01/2022  9:27 AM), Resp: 18 (12/01/2022  9:27 AM), SpO2: 100 % (12/01/2022  9:27 AM), Pain Score: Zero (12/01/2022 10:00 AM), Weight: 92.4 kg (12/01/2022  9:24 AM), Weight (lbs): 203.71 (12/01/2022  9:24 AM)Wt Readings from Last 3 Encounters: 12/01/22 92.4 kg 11/24/22 92.5 kg 11/17/22 94.3 kg Physical ExamConstitutional:     Appearance: Normal appearance. He is not ill-appearing. HENT:    Head: Normocephalic.    Nose: Nose normal. No congestion.    Mouth/Throat:    Mouth: Mucous membranes are moist.    Pharynx: Oropharynx is clear. Eyes:    General: No scleral icterus.      Right eye: No discharge.       Left eye: No discharge. Cardiovascular:    Rate and Rhythm: Normal rate and regular rhythm.    Pulses: Normal pulses.    Heart sounds: Normal heart sounds. No murmur heard.Pulmonary:    Effort: Pulmonary effort is normal. No respiratory distress.    Breath sounds: Normal breath sounds. No wheezing or rales. Abdominal:    General: Abdomen is flat. Bowel sounds are normal. There is no distension.    Palpations: Abdomen is soft.    Tenderness: There is no abdominal tenderness. There is no guarding. Musculoskeletal:       General: No swelling.    Right lower leg: No edema.    Left lower leg: No edema. Skin:   General: Skin is warm.    Findings: No erythema or rash. Neurological:    Mental Status: He is alert and oriented to person, place, and time. Psychiatric:       Behavior: Behavior normal. Lab Results Component Value Date/Time  WBC 3.3 (L) 12/01/2022 09:35 AM  HGB 11.4 (L) 12/01/2022 09:35 AM  HCT 33.00 (L) 12/01/2022 09:35 AM  PLT 186 12/01/2022 09:35 AM  Lab Results Component Value Date/Time  NEUTROPHILS 59.3 12/01/2022 09:35 AM  Lab Results Component Value Date/Time  NA 140 12/01/2022 09:35 AM  K 4.0 12/01/2022 09:35 AM  CL 106 12/01/2022 09:35 AM  CO2 21 12/01/2022 09:35 AM  BUN 15 12/01/2022 09:35 AM  CREATININE 1.13 12/01/2022 09:35 AM  GLU 122 (H) 12/01/2022 09:35 AM  ANIONGAP 13 12/01/2022 09:35 AM  Lab Results Component Value Date/Time  CALCIUM 9.4 12/01/2022 09:35 AM  MG 1.5 (L) 12/01/2022 09:35 AM  PHOS 2.4 12/01/2022 09:35 AM  Lab Results Component Value Date/Time  ALT 26 12/01/2022 09:35 AM  AST 25 12/01/2022 09:35 AM  ALKPHOS 87 12/01/2022 09:35 AM  BILITOT 0.4 12/01/2022 09:35 AM  BILIDIR 0.2 09/11/2022 12:31 PM  ALBUMIN 4.2 12/01/2022 09:35 AM  Lab Results Component Value Date/Time  PTT 23.6 12/01/2022 09:35 AM  LABPROT 12.4 (H) 12/01/2022 09:35 AM  INR 1.14 (H) 12/01/2022 09:35 AM  Results in Past 7 DaysResult Component Current Result LD 249 (H) (12/01/2022) No results found for: RAPAMYCINNo results found for requested labs within last 7 days. Assessment:Douglas [name] is a 72 y.o. 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 He is s/p 2 cycles of chemotherapy with Decitabine + Venetoclax with reduction in blast count. 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 He is engrafted on day +13 and is discharged on 10/10/22. Plan: Day + 68. No s/sx of GVHD. Tac therapeutic.  Wants to go back home in January, asking about follow up. Will discuss with Dr. 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 Needs BM Bx ~ 100, 180 and 365Heme:  No transfusions needed.Transfuse to maintain Hgb > 7 and plts >/=20 or if clinically indicatedInfectious Disease: Prophylactic Antimicrobials of Acyclovir, Cresemba and  Pentam Inhalation given last on 11/17/22 and will be repeated every 3-4 weeks Identified as CMV Risk: check periodically - not detected on 10/08/24GVHD Prophylaxis: No s/sxPost Transplant Cy 25mg /kg IV daily on day 3 and 4 (dose reduced by 50% ) Tacrolimus 6.5mg  BID.  Therapeutic goal of 6-11ng/ml.  Level 7.6 ng/ml.  Continue current dosing.No MMF with 10/10Monitor Triglycerides, Haptoglobin and LDH weekly on Tacrolimus (181/159/249)CV: HTN: Norvasc to 10 mg po dailyVOD Prophylaxis: No s/sxContinue Actigall 500mg  PO BID until day + weight, LFT's and coags frequentlyFluids, Electrolytes and Nutrition:Replete electrolytes PRNDiet - safe food handlingDaily MTV, Folic Acid and MagnesiumMSK: Right Shoulder 85%  back to his normal Right shoulder pain, full ROM with pain. Larey Seat on 11/08/22). Will continue to ice and take tylenol. Also recommended voltaren topical cream. CVAD: Hickman for chemotherapy and supportive carePost Transplant immune reconstitution/vaccines: Date   Day   T cell subset   IGG   ~100     ~180     ~1 year   Plan on re-vaccinating at 1 year unless remain on immunosuppression. Vaccines : 12, 14 , 16 and 24 months s/p transplantHealth Maintenance Day +30 Day +100 Day +180 1 Year Heme/Immune       T cell subsets/IGG       Chimerism/Bone Marrow or PET/Imaging [x]  [] []  [] []  [] []  Pulmonary       PFT's  []  []  []  Cardiology       Echo       EKG   []  Only if cardiac risk factors  [] []  [] []  Endocrine       TSH/TFT's       Fasting Lipids       Glucose/A1c       Vit D/Calcium        Bone Density (women and those on steroids)Reproductive Endocrine        FSH/LH/Testosterone (both men & women)   []  [] [] [] [] [] [] []  Liver      Ferritin      MRI T2* quant (if ferritin abnormal)    [] []  Ophthalmology       Sicca/Cataracts   []  []  Dermatology/musculo-skeletal        GVHD (skin & ROM)        Cancer screening (Derm referral)            [] []  [] []  Dental       GVHD       Routine Care   []  []              Majhail, et al. (2012). Recommended Screening and Preventive Practices for Long-Term Survivors after                    Hematopoietic Cell Transplantation. Biology of Blood and Marrow Transplantation, Volume 18,                    Issue 3, March 2012, Pages 348-371.Conditioning/Transplant:-Melphalan 70 mg/m2 over 30 minutes on D-6, *Melphalan dosed reduced to 70 mg/m2 due to age-Fludarabine 40 mg/m2 over 30 minutes every 24hr for 4 doses from D-5 to D -2             -  Stem cell infusion day 0 (09/24/22)Follow up on 10/8 with Dr. Valentina Shaggy, [id number] [phone] or MHB Barile-PuglieseOn the day of this patient's encounter, a total of >40 minutes was personally spent by me.  This does not include any any time spent performing a procedural service.

## 2022-12-01 NOTE — Progress Notes
 Douglas [name] is day +68 pf his MUD for AML, arrives to [facility] this morning accompanied by his wife for labs/supportive care.He reports feeling very well, with more energy and an increased appetite. He denies pain, nausea and neuropathy. He has been taking his medications as prescribed.R DL Hickman dressing and caps changed. Both lumens flushing w/brisk blood return. Labs drawn per orders.WBC 3.3, hgb 11.4, plt 186, ANC 1.97.No need for transfusions.Seen by APP Amy while in clinic.Discharged home in stable condition. Khaaliq will rtc 10/22.

## 2022-12-01 NOTE — Other
 Post-HSCT Follow-Up Pharmacist note Diagnosis: CMMLConditioning:   Fludarabine/Melphalan with post-transplant Cyclophosphamide Transplant Type:10/10 MUD Allogeneic PBSCTTransplant Date (Day 0): 09/24/22 Referring Provider: Chrystine Oiler, MDEncounter Date: 12/01/2022 = D+68[name] is a 72 y.o. year-old male with history of CMML s/p HSCT. Patient is being seen by the clinic pharmacist for medication management included post stem cell transplant, hematological related disorders and GI related disorders .  This visit was completed as a joint-visit with provider present and is aware of the care plan.Subjective: No complaints of diarrhea and notes that nausea is improving. Still has metallic taste in mouth but feels appetite is overall improving.  No signs of any rashesHeld tacrolimus prior to morning labs Assessment and Recommendations:  Summary of Medication Changes and Follow-up Items Discussed with Provider:No changesGVHD (immunosuppression)Tacrolimus: Current dose: 5 mg PO twice daily10/15/24  level: 7.6, continue current doseGoal range: 6-11 ng/mLDosage dispensed: 0.5mg , 1mg , 5mg  capsulesPharmacy dispensed: OPSPost transplant cyclophosphamide: completed inpatient *All immunosuppression is managed by the bone marrow transplant team.Infectious Disease (prophylaxis, monitoring, and active infections)ViralCurrent prophylaxis/treatment: Acyclovir 400mg  PO twice dailyPrevious viral infections: 	HSV/VZV +/?CMV -/?, 10/8 CMV negativeEBV IgG negativeBK virus N/APneumocystis jeuvecii pneumoniaCurrent prophylaxis/treatment: Pentamidine 300mg  inhaled monthly last given 10/1/24Previous PJP infection: N/AT-cell subsetDay +100 (date):Day +180 (date):1 year (date):Toxoplasmosis IgG negativeCurrent prophylaxis/treatment: NoneFungal/MoldCurrent prophylaxis/treatment: Isavuconazole 372mg  PO daily - history of prolonged cytopenias Previous fungal/mold infection: N/A, Hx of gingival changes to voriconazole Drug monitoring: N/AGalactomann - 10/01/22 negFungitell - 10/01/22 < 31Bacterial	Current prophylaxis/treatment: None	Previous bacterial infection: N/A	Drug monitoring: N/A	Blood cultures: N/ASupportive careHyperlipidemia monitoring (triglycerides, Cholesterol, LDL, HDL)Current treatment: None	Prior treatment: Atorvastatin 40mg  PO daily, held with transplantBaseline (09/18/2022): 68 Today (12/01/22): 181Hypertension monitoring (blood pressure)Current treatment: Amlodipine 10mg  PO dailyPrior treatment: Metoprolol and lisinopril on hold since transplant Today (12/01/22):  133/83GastrointestionalStress ulcer prophylaxis: None, one episode of acid refluxNausea/Vomiting: Intermittent nausea, takes ondansetron with good effect. Doesn't find that prochlorperazine has much benefit.Diarrhea/constipation: Continues to have soft stools, loperamidine prnOral care and mucositis: NoneVeno-occlusive diseaseCurrent treatment:  Ursodiol 500mg  PO twice daily until day +90Anticoagulation	Current prophylaxis/treatment: None	Previous anticoagulation history: Aspirin 81mg  PO daily, for cardiac ppx - not restartingElectrolyte supplementation:	Current treatment: Magnesium oxide 400mg  BID	Monitoring: Mg 1.5 10/15/24Hypogammaglobulinemia	Current treatment:	Goal: 500-800 ng/dLIVIG last dose:Baseline IgG level:Last IgG level:IgG scheduled levelsDay +100 (date):Day +180 (date):	1 year (date):	Maintenance Therapy Plan: VaccinationsBaseline titers: 	Hepatitis B: 	Pneumococcal: 	VZV 	Measles: , Mumps: , Rubella: 12 month: 14 month: 18 month:	Hep B titer	Pneumococcal titers24 month:Titers drawn 2 months after 24 months:Redose needed:Medication History:Douglas [name] presents to today's visit with their med action plan.  A medication history was obtained from the patient and spouse.Adherence:The patient reports 0 missed doses of medications in the past 1 week(s). The patient does not report the barriers to adherence. Discussed potential outcomes associated with non-adherenceAffordability:The patient denies concerns with affordability of medications at this time. Medication Management:Plan for medication management assistance: An updated medication action plan was not given to the patient, no changesPatient would benefit from continued pharmacy follow-up at future clinic visits for medication managementEducated patient on the above changesPatient did not request any refills todayObjective: NWG:NFAOZH Results (from the past 168 hour(s)) CBC auto differential  Collection Time: 12/01/22  9:35 AM Result Value Ref Range  WBC 3.3 (L) 4.0 - 11.0 x1000/?L  RBC 3.36 (L) 4.00 - 6.00 M/?L  Hemoglobin 11.4 (L) 13.2 - 17.1 g/dL  Hematocrit 08.65 (L) 78.46 - 50.00 %  MCV 98.2 80.0 - 100.0 fL  MCH 33.9 (H) 27.0 - 33.0 pg  MCHC 34.5 31.0 - 36.0 g/dL  RDW-CV 96.2 (H) 95.2 - 15.0 %  Platelets 186 150 - 420 x1000/?L  MPV 10.4 8.0 - 12.0 fL  Neutrophils 59.3 39.0 - 72.0 %  Lymphocytes 23.8 17.0 - 50.0 %  Monocytes 12.7 (H) 4.0 - 12.0 %  Eosinophils 3.0 0.0 - 5.0 %  Basophil 0.9 0.0 - 1.4 %  Immature Granulocytes 0.3 0.0 - 1.0 %  nRBC 0.0 0.0 - 1.0 %  Absolute Lymphocyte Count 0.79 0.60 - 3.70 x 1000/?L  Monocyte Absolute Count 0.42 0.00 - 1.00 x 1000/?L  Eosinophil Absolute Count 0.10 0.00 - 1.00 x 1000/?L  Basophil Absolute Count 0.03 0.00 - 1.00 x 1000/?L  Absolute Immature Granulocyte Count 0.01 0.00 - 0.30 x 1000/?L  Absolute nRBC 0.00 0.00 - 1.00 x 1000/?L  ANC (Abs Neutrophil Count) 1.97 (L) 2.00 - 7.60 x 1000/?L [id number] Results (from the past 168 hour(s)) Comprehensive metabolic panel  Collection Time: 12/01/22  9:35 AM Result Value Ref Range  Sodium 140 136 - 144 mmol/L  Potassium 4.0 3.3 - 5.3 mmol/L  Chloride 106 98 - 107 mmol/L  CO2 21 20 - 30 mmol/L  Anion Gap 13 7 - 17  Glucose 122 (H) 70 - 100 mg/dL  BUN 15 8 - 23 mg/dL  Creatinine 1.09 3.23 - 1.30 mg/dL  Calcium 9.4 8.8 - 55.7 mg/dL  BUN/Creatinine Ratio 32.2 8.0 - 23.0  Total Protein 6.8 5.9 - 8.3 g/dL  Albumin 4.2 3.6 - 5.1 g/dL  Total Bilirubin 0.4 <=0.2 mg/dL  Alkaline Phosphatase 87 9 - 122 U/L  Alanine Aminotransferase (ALT) 26 9 - 59 U/L  Aspartate Aminotransferase (AST) 25 10 - 35 U/L  Globulin 2.6 2.0 - 3.9 g/dL  A/G Ratio 1.6 1.0 - 2.2  AST/ALT Ratio 1.0 Reference Range Not Established  eGFR (Creatinine) >60 >=60 mL/min/1.30m2 LDH: Recent Results (from the past 168 hour(s)) Lactate dehydrogenase  Collection Time: 12/01/22  9:35 AM Result Value Ref Range  LD 249 (H) 122 - 241 U/L Patient Education: The following education was provided for: Initiation, modification, or discontinuation of drug therapy, Assessment of efficacy and safety of drug therapy, and Adverse effect presentation, evaluation, and monitoring during today?s visit to the patient and spouse regarding medication management: purpose of each medication, side effects of medications, and importance of adherence with the regimen(s) Time Spent: 15 minutes, face-to-face consultPATIENT AND/OR CAREGIVER VERBALIZED UNDERSTANDING OF CARE PLAN: yesPATIENT ADVISED TO CALL BACK WITH QUESTIONS, CONCERNS, OR CHANGE IN SYMPTOMS.Vevelyn Francois, PharmD, BCOP10/15/24 10:07 AM

## 2022-12-02 DIAGNOSIS — K529 Noninfective gastroenteritis and colitis, unspecified: Secondary | ICD-10-CM | POA: Diagnosis not present

## 2022-12-02 DIAGNOSIS — Z860101 Personal history of adenomatous and serrated colon polyps: Secondary | ICD-10-CM | POA: Diagnosis not present

## 2022-12-02 DIAGNOSIS — K219 Gastro-esophageal reflux disease without esophagitis: Secondary | ICD-10-CM | POA: Diagnosis not present

## 2022-12-02 DIAGNOSIS — K220 Achalasia of cardia: Secondary | ICD-10-CM | POA: Diagnosis not present

## 2022-12-02 LAB — CYTOMEGALOVIRUS QUANTITATIVE PCR, PLASMA     (BH GH L LMW YH): BKR CMV QUANTITATIVE PCR: NOT DETECTED

## 2022-12-03 MED FILL — URSODIOL 500 MG TABLET: 500 mg | ORAL | 30 days supply | Qty: 60 | Fill #2 | Status: CP

## 2022-12-08 ENCOUNTER — Inpatient Hospital Stay: Admit: 2022-12-08 | Discharge: 2022-12-08 | Payer: MEDICARE

## 2022-12-08 ENCOUNTER — Encounter: Admit: 2022-12-08 | Payer: PRIVATE HEALTH INSURANCE

## 2022-12-08 ENCOUNTER — Ambulatory Visit: Admit: 2022-12-08 | Payer: MEDICARE | Attending: Medical Oncology

## 2022-12-08 VITALS — BP 129/70 | HR 75 | Temp 97.80000°F | Resp 18 | Wt 204.6 lb

## 2022-12-08 DIAGNOSIS — I119 Hypertensive heart disease without heart failure: Secondary | ICD-10-CM

## 2022-12-08 DIAGNOSIS — Z9484 Stem cells transplant status: Secondary | ICD-10-CM

## 2022-12-08 DIAGNOSIS — F32A Depression: Secondary | ICD-10-CM

## 2022-12-08 DIAGNOSIS — C4491 Basal cell carcinoma of skin, unspecified: Secondary | ICD-10-CM

## 2022-12-08 DIAGNOSIS — Z8249 Family history of ischemic heart disease and other diseases of the circulatory system: Secondary | ICD-10-CM

## 2022-12-08 DIAGNOSIS — C9200 Acute myeloblastic leukemia, not having achieved remission: Secondary | ICD-10-CM

## 2022-12-08 DIAGNOSIS — E782 Mixed hyperlipidemia: Secondary | ICD-10-CM

## 2022-12-08 DIAGNOSIS — R0609 Other forms of dyspnea: Secondary | ICD-10-CM

## 2022-12-08 DIAGNOSIS — C9110 Chronic lymphocytic leukemia of B-cell type not having achieved remission: Secondary | ICD-10-CM

## 2022-12-08 DIAGNOSIS — F419 Anxiety disorder, unspecified: Secondary | ICD-10-CM

## 2022-12-08 DIAGNOSIS — N400 Enlarged prostate without lower urinary tract symptoms: Secondary | ICD-10-CM

## 2022-12-08 DIAGNOSIS — I10 Essential (primary) hypertension: Secondary | ICD-10-CM

## 2022-12-08 DIAGNOSIS — C931 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: Secondary | ICD-10-CM

## 2022-12-08 DIAGNOSIS — H409 Unspecified glaucoma: Secondary | ICD-10-CM

## 2022-12-08 LAB — CBC WITH AUTO DIFFERENTIAL
BKR WAM ABSOLUTE IMMATURE GRANULOCYTES.: 0.01 x 1000/ÂµL (ref 0.00–0.30)
BKR WAM ABSOLUTE LYMPHOCYTE COUNT.: 0.73 x 1000/ÂµL (ref 0.60–3.70)
BKR WAM ABSOLUTE NRBC (2 DEC): 0 x 1000/ÂµL (ref 0.00–1.00)
BKR WAM ANC (ABSOLUTE NEUTROPHIL COUNT): 2.21 x 1000/ÂµL (ref 2.00–7.60)
BKR WAM BASOPHIL ABSOLUTE COUNT.: 0.04 x 1000/ÂµL (ref 0.00–1.00)
BKR WAM BASOPHILS: 1.1 % (ref 0.0–1.4)
BKR WAM EOSINOPHIL ABSOLUTE COUNT.: 0.11 x 1000/ÂµL (ref 0.00–1.00)
BKR WAM EOSINOPHILS: 3.1 % (ref 0.0–5.0)
BKR WAM HEMATOCRIT (2 DEC): 33.2 % — ABNORMAL LOW (ref 38.50–50.00)
BKR WAM HEMOGLOBIN: 11.7 g/dL — ABNORMAL LOW (ref 13.2–17.1)
BKR WAM IMMATURE GRANULOCYTES: 0.3 % (ref 0.0–1.0)
BKR WAM LYMPHOCYTES: 20.9 % (ref 17.0–50.0)
BKR WAM MCH (PG): 35 pg — ABNORMAL HIGH (ref 27.0–33.0)
BKR WAM MCHC: 35.2 g/dL (ref 31.0–36.0)
BKR WAM MCV: 99.4 fL (ref 80.0–100.0)
BKR WAM MONOCYTE ABSOLUTE COUNT.: 0.4 x 1000/ÂµL (ref 0.00–1.00)
BKR WAM MONOCYTES: 11.4 % (ref 4.0–12.0)
BKR WAM MPV: 10.5 fL (ref 8.0–12.0)
BKR WAM NEUTROPHILS: 63.2 % (ref 39.0–72.0)
BKR WAM NUCLEATED RED BLOOD CELLS: 0 % (ref 0.0–1.0)
BKR WAM PLATELETS: 162 x1000/ÂµL (ref 150–420)
BKR WAM RDW-CV: 16.5 % — ABNORMAL HIGH (ref 11.0–15.0)
BKR WAM RED BLOOD CELL COUNT.: 3.34 M/ÂµL — ABNORMAL LOW (ref 4.00–6.00)
BKR WAM WHITE BLOOD CELL COUNT: 3.5 x1000/ÂµL — ABNORMAL LOW (ref 4.0–11.0)

## 2022-12-08 LAB — RETICULOCYTES
BKR WAM IRF: 11.4 % (ref 3.0–15.9)
BKR WAM RETICULOCYTE - ABS (3 DEC): 0.036 10Ë6 cells/uL (ref 0.023–0.140)
BKR WAM RETICULOCYTE COUNT PCT (1 DEC): 1.1 % (ref 0.6–2.7)
BKR WAM RETICULOCYTE HGB EQUIVALENT: 41.3 pg — ABNORMAL HIGH (ref 28.2–35.7)

## 2022-12-08 LAB — TACROLIMUS LEVEL     (BH GH L LMW YH): BKR TACROLIMUS BLOOD: 9.1 ng/mL

## 2022-12-08 LAB — COMPREHENSIVE METABOLIC PANEL
BKR A/G RATIO: 1.6 (ref 1.0–2.2)
BKR ALANINE AMINOTRANSFERASE (ALT): 22 U/L (ref 9–59)
BKR ALBUMIN: 4.1 g/dL (ref 3.6–5.1)
BKR ALKALINE PHOSPHATASE: 83 U/L (ref 9–122)
BKR ANION GAP: 12 (ref 7–17)
BKR ASPARTATE AMINOTRANSFERASE (AST): 21 U/L (ref 10–35)
BKR AST/ALT RATIO: 1
BKR BILIRUBIN TOTAL: 0.3 mg/dL (ref <=1.2)
BKR BLOOD UREA NITROGEN: 18 mg/dL (ref 8–23)
BKR BUN / CREAT RATIO: 14.6 (ref 8.0–23.0)
BKR CALCIUM: 9.5 mg/dL (ref 8.8–10.2)
BKR CHLORIDE: 106 mmol/L (ref 98–107)
BKR CO2: 21 mmol/L (ref 20–30)
BKR CREATININE: 1.23 mg/dL (ref 0.40–1.30)
BKR EGFR, CREATININE (CKD-EPI 2021): >60 mL/min/{1.73_m2} (ref >=60)
BKR GLOBULIN: 2.5 g/dL (ref 2.0–3.9)
BKR GLUCOSE: 109 mg/dL — ABNORMAL HIGH (ref 70–100)
BKR POTASSIUM: 4.3 mmol/L (ref 3.3–5.3)
BKR PROTEIN TOTAL: 6.6 g/dL (ref 5.9–8.3)
BKR SODIUM: 139 mmol/L (ref 136–144)

## 2022-12-08 LAB — TRIGLYCERIDES: BKR TRIGLYCERIDES: 200 mg/dL — ABNORMAL HIGH

## 2022-12-08 LAB — HAPTOGLOBIN: BKR HAPTOGLOBIN: 139 mg/dL (ref 30–200)

## 2022-12-08 LAB — LACTATE DEHYDROGENASE: BKR LACTATE DEHYDROGENASE: 212 U/L (ref 122–241)

## 2022-12-08 LAB — PT/INR AND PTT (BH GH L LMW YH)
BKR INR: 1.07 (ref 0.86–1.13)
BKR PARTIAL THROMBOPLASTIN TIME: 23.6 s (ref 23.0–31.0)
BKR PROTHROMBIN TIME: 11.7 s (ref 9.6–12.3)

## 2022-12-08 LAB — MAGNESIUM: BKR MAGNESIUM: 1.5 mg/dL — ABNORMAL LOW (ref 1.7–2.4)

## 2022-12-08 LAB — PHOSPHORUS     (BH GH L LMW YH): BKR PHOSPHORUS: 2.6 mg/dL (ref 2.2–4.5)

## 2022-12-08 NOTE — Progress Notes
 Day + 75 post MUD for his AML.Reports good energy and good appetite.No fever/chills; no nausea/vomiting/diarrhea.Skin intact; no rash/edema noted.No SOB; no cough nor nasal congestion.RDLH dressing and claves changed.Labs drawn-no hydration/repletion needed.Seen by MD Arcola Jansky changes made at this time.RTC in one week.

## 2022-12-09 ENCOUNTER — Encounter: Admit: 2022-12-09 | Payer: PRIVATE HEALTH INSURANCE

## 2022-12-09 LAB — CYTOMEGALOVIRUS QUANTITATIVE PCR, PLASMA     (BH GH L LMW YH): BKR CMV QUANTITATIVE PCR: NOT DETECTED

## 2022-12-11 ENCOUNTER — Encounter: Admit: 2022-12-11 | Payer: PRIVATE HEALTH INSURANCE

## 2022-12-11 NOTE — Progress Notes
 SOCIAL WORK NOTEPatient Name: Douglas Vanduyne BennettMedical Record Number: [id number] Date of Birth: Jan 18, 1952Medical Social Work Follow Up  [employer] Most Recent Value Admission Information  Document Type Progress Note Reason for Current Social Work Regulatory affairs officer of Information Patient, Medical Team Medical Team Comment Maureen Major Campos Record Reviewed Yes Level of Care Ambulatory What medium(s) of communication were used with patient/family/caregiver? Telephone Psychosocial issues requiring intervention Suites Psychosocial interventions 5 minutes by phone with [name], clarified Suites dates 8/29-9/11 and that our nursing leadership has contacted the Suites general manager to have paid by [employer]. Education officer, environmental Specific referrals to enhance community supports (include existing and new resources) above Handoff Required? No Social Work will take lead to arrange post-acute care services and continue work with the care team as patient progresses towards discharge No Next Steps/Plan (including hand-off): Social work intervention complete. Please reconsult social work if needed. Signature: Huntley Estelle, LCSW Contact Information: [phone]

## 2022-12-14 ENCOUNTER — Encounter: Admit: 2022-12-14 | Payer: PRIVATE HEALTH INSURANCE

## 2022-12-14 NOTE — Progress Notes
 SOCIAL WORK NOTEPatient Name: Adhrith Wierman BennettMedical Record Number: [id number] Date of Birth: [birth year]Medical Social Work Follow Up  [employer] Most Recent Value Admission Information  Document Type Progress Note Reason for Current Social Work Regulatory affairs officer of Information Patient Medical Team Comment Maureen Major Campos Record Reviewed Yes Level of Care Ambulatory What medium(s) of communication were used with patient/family/caregiver? Telephone Psychosocial issues requiring intervention Suites Psychosocial interventions 5 minutes by phone with Mr. Dziuba who shared that the bill for 8/29- 9/5 was reimbursed, but not 9/6- 9/11. I advised him that I will update nursing leadeship who I believe will contact the Suites. Education officer, environmental Specific referrals to enhance community supports (include existing and new resources) above Handoff Required? No Next Steps/Plan (including hand-off): Social work intervention complete. Please reconsult social work if needed. Signature: Huntley Estelle, LCSW Contact Information: [phone]

## 2022-12-15 ENCOUNTER — Inpatient Hospital Stay: Admit: 2022-12-15 | Payer: PRIVATE HEALTH INSURANCE

## 2022-12-15 ENCOUNTER — Ambulatory Visit: Admit: 2022-12-15 | Payer: MEDICARE

## 2022-12-16 ENCOUNTER — Ambulatory Visit: Admit: 2022-12-16 | Payer: MEDICARE | Attending: Family

## 2022-12-16 ENCOUNTER — Ambulatory Visit: Admit: 2022-12-16 | Payer: MEDICARE

## 2022-12-16 ENCOUNTER — Encounter: Admit: 2022-12-16 | Payer: PRIVATE HEALTH INSURANCE

## 2022-12-16 ENCOUNTER — Telehealth: Admit: 2022-12-16 | Payer: PRIVATE HEALTH INSURANCE | Attending: Family

## 2022-12-16 ENCOUNTER — Inpatient Hospital Stay: Admit: 2022-12-16 | Discharge: 2022-12-16 | Payer: MEDICARE

## 2022-12-16 VITALS — BP 134/80 | HR 85 | Temp 97.90000°F | Resp 16 | Wt 200.2 lb

## 2022-12-16 DIAGNOSIS — I10 Essential (primary) hypertension: Secondary | ICD-10-CM

## 2022-12-16 DIAGNOSIS — Z7969 Long term (current) use of other immunomodulators and immunosuppressants: Secondary | ICD-10-CM

## 2022-12-16 DIAGNOSIS — Z79899 Other long term (current) drug therapy: Secondary | ICD-10-CM

## 2022-12-16 DIAGNOSIS — C931 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: Secondary | ICD-10-CM

## 2022-12-16 DIAGNOSIS — Z8249 Family history of ischemic heart disease and other diseases of the circulatory system: Secondary | ICD-10-CM

## 2022-12-16 DIAGNOSIS — F419 Anxiety disorder, unspecified: Secondary | ICD-10-CM

## 2022-12-16 DIAGNOSIS — H53451 Other localized visual field defect, right eye: Secondary | ICD-10-CM

## 2022-12-16 DIAGNOSIS — Z79621 Long term (current) use of calcineurin inhibitor: Secondary | ICD-10-CM

## 2022-12-16 DIAGNOSIS — E782 Mixed hyperlipidemia: Secondary | ICD-10-CM

## 2022-12-16 DIAGNOSIS — Z5181 Encounter for therapeutic drug level monitoring: Secondary | ICD-10-CM

## 2022-12-16 DIAGNOSIS — Z79624 Long term (current) use of inhibitors of nucleotide synthesis: Secondary | ICD-10-CM

## 2022-12-16 DIAGNOSIS — Z9484 Stem cells transplant status: Secondary | ICD-10-CM

## 2022-12-16 DIAGNOSIS — I119 Hypertensive heart disease without heart failure: Secondary | ICD-10-CM

## 2022-12-16 DIAGNOSIS — C9110 Chronic lymphocytic leukemia of B-cell type not having achieved remission: Secondary | ICD-10-CM

## 2022-12-16 DIAGNOSIS — C9200 Acute myeloblastic leukemia, not having achieved remission: Secondary | ICD-10-CM

## 2022-12-16 DIAGNOSIS — Z9481 Bone marrow transplant status: Secondary | ICD-10-CM

## 2022-12-16 DIAGNOSIS — H409 Unspecified glaucoma: Secondary | ICD-10-CM

## 2022-12-16 DIAGNOSIS — N400 Enlarged prostate without lower urinary tract symptoms: Secondary | ICD-10-CM

## 2022-12-16 DIAGNOSIS — C4491 Basal cell carcinoma of skin, unspecified: Secondary | ICD-10-CM

## 2022-12-16 DIAGNOSIS — R5381 Other malaise: Secondary | ICD-10-CM

## 2022-12-16 DIAGNOSIS — F32A Depression: Secondary | ICD-10-CM

## 2022-12-16 DIAGNOSIS — R638 Other symptoms and signs concerning food and fluid intake: Secondary | ICD-10-CM

## 2022-12-16 DIAGNOSIS — R0609 Other forms of dyspnea: Secondary | ICD-10-CM

## 2022-12-16 LAB — COMPREHENSIVE METABOLIC PANEL
BKR A/G RATIO: 1.7 (ref 1.0–2.2)
BKR ALANINE AMINOTRANSFERASE (ALT): 24 U/L (ref 9–59)
BKR ALBUMIN: 4.2 g/dL (ref 3.6–5.1)
BKR ALKALINE PHOSPHATASE: 89 U/L (ref 9–122)
BKR ANION GAP: 12 (ref 7–17)
BKR ASPARTATE AMINOTRANSFERASE (AST): 27 U/L (ref 10–35)
BKR AST/ALT RATIO: 1.1
BKR BILIRUBIN TOTAL: 0.3 mg/dL (ref <=1.2)
BKR BLOOD UREA NITROGEN: 19 mg/dL (ref 8–23)
BKR BUN / CREAT RATIO: 15.2 (ref 8.0–23.0)
BKR CALCIUM: 9.4 mg/dL (ref 8.8–10.2)
BKR CHLORIDE: 108 mmol/L — ABNORMAL HIGH (ref 98–107)
BKR CO2: 20 mmol/L (ref 20–30)
BKR CREATININE: 1.25 mg/dL (ref 0.40–1.30)
BKR EGFR, CREATININE (CKD-EPI 2021): >60 mL/min/{1.73_m2} (ref >=60)
BKR GLOBULIN: 2.5 g/dL (ref 2.0–3.9)
BKR GLUCOSE: 119 mg/dL — ABNORMAL HIGH (ref 70–100)
BKR POTASSIUM: 4.1 mmol/L (ref 3.3–5.3)
BKR PROTEIN TOTAL: 6.7 g/dL (ref 5.9–8.3)
BKR SODIUM: 140 mmol/L (ref 136–144)

## 2022-12-16 LAB — CBC WITH AUTO DIFFERENTIAL
BKR WAM ABSOLUTE IMMATURE GRANULOCYTES.: 0.01 x 1000/ÂµL (ref 0.00–0.30)
BKR WAM ABSOLUTE LYMPHOCYTE COUNT.: 0.66 x 1000/ÂµL (ref 0.60–3.70)
BKR WAM ABSOLUTE NRBC (2 DEC): 0 x 1000/ÂµL (ref 0.00–1.00)
BKR WAM ANC (ABSOLUTE NEUTROPHIL COUNT): 1.89 x 1000/ÂµL — ABNORMAL LOW (ref 2.00–7.60)
BKR WAM BASOPHIL ABSOLUTE COUNT.: 0.04 x 1000/ÂµL (ref 0.00–1.00)
BKR WAM BASOPHILS: 1.3 % (ref 0.0–1.4)
BKR WAM EOSINOPHIL ABSOLUTE COUNT.: 0.07 x 1000/ÂµL (ref 0.00–1.00)
BKR WAM EOSINOPHILS: 2.3 % (ref 0.0–5.0)
BKR WAM HEMATOCRIT (2 DEC): 33.9 % — ABNORMAL LOW (ref 38.50–50.00)
BKR WAM HEMOGLOBIN: 12.1 g/dL — ABNORMAL LOW (ref 13.2–17.1)
BKR WAM IMMATURE GRANULOCYTES: 0.3 % (ref 0.0–1.0)
BKR WAM LYMPHOCYTES: 21.4 % (ref 17.0–50.0)
BKR WAM MCH (PG): 35.6 pg — ABNORMAL HIGH (ref 27.0–33.0)
BKR WAM MCHC: 35.7 g/dL (ref 31.0–36.0)
BKR WAM MCV: 99.7 fL (ref 80.0–100.0)
BKR WAM MONOCYTE ABSOLUTE COUNT.: 0.42 x 1000/ÂµL (ref 0.00–1.00)
BKR WAM MONOCYTES: 13.6 % — ABNORMAL HIGH (ref 4.0–12.0)
BKR WAM MPV: 10.5 fL (ref 8.0–12.0)
BKR WAM NEUTROPHILS: 61.1 % (ref 39.0–72.0)
BKR WAM NUCLEATED RED BLOOD CELLS: 0 % (ref 0.0–1.0)
BKR WAM PLATELETS: 168 x1000/ÂµL (ref 150–420)
BKR WAM RDW-CV: 15.5 % — ABNORMAL HIGH (ref 11.0–15.0)
BKR WAM RED BLOOD CELL COUNT.: 3.4 M/ÂµL — ABNORMAL LOW (ref 4.00–6.00)
BKR WAM WHITE BLOOD CELL COUNT: 3.1 x1000/ÂµL — ABNORMAL LOW (ref 4.0–11.0)

## 2022-12-16 LAB — RETICULOCYTES
BKR WAM IRF: 12 % (ref 3.0–15.9)
BKR WAM RETICULOCYTE - ABS (3 DEC): 0.035 10Ë6 cells/uL (ref 0.023–0.140)
BKR WAM RETICULOCYTE COUNT PCT (1 DEC): 1 % (ref 0.6–2.7)
BKR WAM RETICULOCYTE HGB EQUIVALENT: 40.7 pg — ABNORMAL HIGH (ref 28.2–35.7)

## 2022-12-16 LAB — PT/INR AND PTT (BH GH L LMW YH)
BKR INR: 1.08 (ref 0.86–1.13)
BKR PARTIAL THROMBOPLASTIN TIME: 23.9 s (ref 23.0–31.0)
BKR PROTHROMBIN TIME: 11.8 s (ref 9.6–12.3)

## 2022-12-16 LAB — TACROLIMUS LEVEL     (BH GH L LMW YH): BKR TACROLIMUS BLOOD: 7.9 ng/mL

## 2022-12-16 LAB — TRIGLYCERIDES: BKR TRIGLYCERIDES: 183 mg/dL — ABNORMAL HIGH

## 2022-12-16 LAB — HAPTOGLOBIN: BKR HAPTOGLOBIN: 132 mg/dL (ref 30–200)

## 2022-12-16 LAB — MAGNESIUM: BKR MAGNESIUM: 1.5 mg/dL — ABNORMAL LOW (ref 1.7–2.4)

## 2022-12-16 LAB — PHOSPHORUS     (BH GH L LMW YH): BKR PHOSPHORUS: 2.6 mg/dL (ref 2.2–4.5)

## 2022-12-16 MED ORDER — ALBUTEROL SULFATE HFA 90 MCG/ACTUATION AEROSOL INHALER
90 | Freq: Once | RESPIRATORY_TRACT | Status: CP
Start: 2022-12-16 — End: ?
  Administered 2022-12-16: 15:00:00 90 mcg/actuation via RESPIRATORY_TRACT

## 2022-12-16 MED ORDER — PENTAMIDINE FOR INHALATION SOLUTION
Freq: Once | RESPIRATORY_TRACT | Status: CP
Start: 2022-12-16 — End: ?
  Administered 2022-12-16: 15:00:00 6.000 mL via RESPIRATORY_TRACT

## 2022-12-16 NOTE — Progress Notes
 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 No chest pain, heart palpitations, cough or shortness of breath at rest, has DOE. No nausea or vomiting. Eating 60% of his normal. No diarrhea or constipation, however did have some loose stool yesterday that resolved. Voiding without difficulty. No rash, fever or chills. Continues with hand tremor. Note that when he gazes laterally with his right eye, he occasionally sees a halo or a burst of light. Not consistent.Review of Systems Constitutional: Negative.  Negative for chills, fever and weight loss. HENT: Negative.  Negative for sore throat.  Eyes: Negative.       Note that when he gazes laterally with his right eye, he occasionally sees a halo or a burst of light. Not consistent. Respiratory: Negative.  Negative for cough, sputum production and shortness of breath.  Cardiovascular: Negative.  Negative for chest pain, palpitations and leg swelling. Gastrointestinal:  Negative for abdominal pain, constipation, diarrhea, nausea and vomiting.      Estimates 60% Genitourinary: Negative.  Negative for dysuria. Musculoskeletal:  Positive for falls (11/08/22).      Right shoulder 85% better Skin: Negative.  Negative for itching and rash. Neurological:  Positive for tremors (Shakiness of hands.). Negative for dizziness, weakness and headaches. Psychiatric/Behavioral: Negative.   VoriconazoleCurrent Outpatient Medications on File Prior to Visit Medication Sig Dispense Refill  acyclovir (ZOVIRAX) 400 mg tablet Take 1 tablet (400 mg total) by mouth every 12 (twelve) hours. 180 tablet 3  amLODIPine (NORVASC) 10 mg tablet Take 1 tablet (10 mg total) by mouth daily.    finasteride (PROSCAR) 5 mg tablet TAKE 1 TABLET BY MOUTH DAILY FOR ENLARGED PROSTATE WITH URINATION PROBLEM TAKE WITH OR WITHOUT MEALS    folic acid (FOLVITE) 1 mg tablet Take 1 tablet (1 mg total) by mouth daily. 30 tablet 11  isavuconazonium (CRESEMBA) 186 mg capsule Take 2 capsules (372 mg total) by mouth daily. 56 capsule 11  latanoprost (XALATAN) 0.005 % ophthalmic solution Place 1 drop into both eyes nightly.    magnesium oxide 400 mg magnesium Cap Take 400 mg by mouth daily.    Miscellaneous Medical Supply Please provide rolling walker, Use as directed. 1 each 0  multivitamin tablet Take 1 tablet by mouth daily. 30 tablet 11  ondansetron (ZOFRAN) 8 mg tablet Take 1 tablet (8 mg total) by mouth every 8 (eight) hours as needed for nausea or vomiting. (Patient not taking: Reported on 12/01/2022) 30 tablet 2  pentamidine (PENTAM) 300 mg inhalation solution Inhale 6 mLs (300 mg total) into the lungs every 28 days. Last given 12/16/22    sodium chloride 0.9 % flush Use 10 mLs by IV Push route daily. In both lumens 600 mL 11  tacrolimus (PROGRAF) 5 mg Immediate Release capsule Take 1 capsule (5 mg total) by mouth 2 (two) times daily. (Patient taking differently: Take 1 capsule (5 mg total) by mouth 2 (two) times daily. 5 mg bid) 60 capsule 11  ursodioL (URSO FORTE) 500 mg tablet Take 1 tablet (500 mg total) by mouth 2 times daily (9am, 5pm). 60 tablet 6  [DISCONTINUED] venetoclax (VENCLEXTA) 100 mg tablet Take 4 tablets (400 mg total) by mouth once daily. Take  with food. Swallow whole, do not crush. 120 tablet 0 Current Facility-Administered Medications on File Prior to Visit Medication Dose Route Frequency Provider Last Rate Last Admin  [COMPLETED] albuterol sulfate 90 mcg/actuation HFA aerosol inhaler 2 puff  2 puff Inhalation Once Mazy Culton Barile, APRN   2 puff at 12/16/22 1041  [COMPLETED] pentamidine 300 mg INHALATION solution  300 mg Nebulization Once Lindy Garczynski Barile, APRN   300 mg at 12/16/22 1043 BP: 134/80 (12/16/2022 10:27 AM), Pulse: 85 (12/16/2022 10:27 AM), Temp: 97.9 ?F (36.6 ?C) (12/16/2022 10:27 AM), Resp: 16 (12/16/2022 10:27 AM), SpO2: 100 % (12/16/2022 10:27 AM), Pain Score: Zero (12/16/2022 10:23 AM), Weight: 90.8 kg (12/16/2022 10:23 AM), Weight (lbs): 200.18 (12/16/2022 10:23 AM)Wt Readings from Last 3 Encounters: 12/16/22 90.8 kg 12/08/22 92.8 kg 12/01/22 92.4 kg Physical ExamConstitutional:     Appearance: Normal appearance. He is not ill-appearing. HENT:    Head: Normocephalic.    Nose: Nose normal. No congestion.    Mouth/Throat:    Mouth: Mucous membranes are moist.    Pharynx: Oropharynx is clear. Eyes:    General: No scleral icterus.      Right eye: No discharge.       Left eye: No discharge. Cardiovascular:    Rate and Rhythm: Normal rate and regular rhythm.    Pulses: Normal pulses.    Heart sounds: Normal heart sounds. No murmur heard.Pulmonary:    Effort: Pulmonary effort is normal. No respiratory distress.    Breath sounds: Normal breath sounds. No wheezing or rales. Abdominal:    General: Abdomen is flat. Bowel sounds are normal. There is no distension.    Palpations: Abdomen is soft.    Tenderness: There is no abdominal tenderness. There is no guarding. Musculoskeletal:       General: No swelling.    Right lower leg: No edema.    Left lower leg: No edema. Skin:   General: Skin is warm.    Findings: No erythema or rash. Neurological:    Mental Status: He is alert and oriented to person, place, and time. Psychiatric:       Behavior: Behavior normal. Lab Results Component Value Date/Time  WBC 3.1 (L) 12/16/2022 10:21 AM  HGB 12.1 (L) 12/16/2022 10:21 AM  HCT 33.90 (L) 12/16/2022 10:21 AM  PLT 168 12/16/2022 10:21 AM  Lab Results Component Value Date/Time  NEUTROPHILS 61.1 12/16/2022 10:21 AM  Lab Results Component Value Date/Time  NA 139 12/08/2022 09:22 AM  K 4.3 12/08/2022 09:22 AM  CL 106 12/08/2022 09:22 AM  CO2 21 12/08/2022 09:22 AM  BUN 18 12/08/2022 09:22 AM  CREATININE 1.23 12/08/2022 09:22 AM  GLU 109 (H) 12/08/2022 09:22 AM  ANIONGAP 12 12/08/2022 09:22 AM  Lab Results Component Value Date/Time  CALCIUM 9.5 12/08/2022 09:22 AM  MG 1.5 (L) 12/08/2022 09:22 AM  PHOS 2.6 12/08/2022 09:22 AM  Lab Results Component Value Date/Time  ALT 22 12/08/2022 09:22 AM  AST 21 12/08/2022 09:22 AM  ALKPHOS 83 12/08/2022 09:22 AM  BILITOT 0.3 12/08/2022 09:22 AM  BILIDIR 0.2 09/11/2022 12:31 PM  ALBUMIN 4.1 12/08/2022 09:22 AM  Lab Results Component Value Date/Time  PTT 23.9 12/16/2022 10:21 AM  LABPROT 11.8 12/16/2022 10:21 AM  INR 1.08 12/16/2022 10:21 AM  No results found for requested labs within last 7 days. No results found for: RAPAMYCINNo results found for requested labs within last 7 days. Assessment:Douglas [name] is a 72 y.o. 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 He is s/p 2 cycles of chemotherapy with Decitabine + Venetoclax with reduction in blast count. 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 He is engrafted on day +13 and is discharged on 10/10/22. Plan: Day + 83. No s/sx of GVHD. Tac therapeutic. He will call his primary ophthalmologist in [place] to see if his intermittent visual changes need further assessment or can wait until they return to [facility]. 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 Needs BM Bx ~ 100(clinic performed) , 180 and 365Heme:  No transfusions needed.Transfuse to maintain Hgb > 7 and plts >/=20 or if clinically indicatedInfectious Disease: Prophylactic Antimicrobials of Acyclovir, Cresemba and  Pentam Inhalation given last on 12/16/22 and will be repeated every 3-4 weeks Identified as CMV Risk: check periodically - not detected on 10/22/24GVHD Prophylaxis: No s/sxPost Transplant Cy 25mg /kg IV daily on day 3 and 4 (dose reduced by 50% ) Tacrolimus 6.5mg  BID.  Therapeutic goal of 6-11ng/ml.  Level 7.9 ng/ml.  Continue current dosing.No MMF with 10/10Monitor Triglycerides, Haptoglobin and LDH weekly on Tacrolimus (183/132/hemolyzed)CV: HTN: Norvasc to 10 mg po dailyVOD Prophylaxis: No s/sxContinue Actigall 500mg  PO BID until day + weight, LFT's and coags frequentlyFluids, Electrolytes and Nutrition:Replete electrolytes PRNDiet - safe food handlingDaily MTV, Folic Acid and MagnesiumMSK: Right Shoulder 85%  back to his normal Right shoulder pain, full ROM with pain. Larey Seat on 11/08/22). Will continue to ice and take tylenol. Also recommended voltaren topical cream. CVAD: Hickman for chemotherapy and supportive carePost Transplant immune reconstitution/vaccines: Date   Day   T cell subset   IGG   ~100     ~180     ~1 year   Plan on re-vaccinating at 1 year unless remain on immunosuppression. Vaccines : 12, 14 , 16 and 24 months s/p transplantHealth Maintenance Day +30 Day +100 Day +180 1 Year Heme/Immune       T cell subsets/IGG       Chimerism/Bone Marrow or PET/Imaging [x]  [] []  [] []  [] []  Pulmonary       PFT's  []  []  []  Cardiology       Echo       EKG   []  Only if cardiac risk factors  [] []  [] []  Endocrine       TSH/TFT's       Fasting Lipids Glucose/A1c       Vit D/Calcium        Bone Density (women and those on steroids)Reproductive Endocrine        FSH/LH/Testosterone (both men & women)   []  [] [] [] [] [] [] []  Liver      Ferritin      MRI T2* quant (if ferritin abnormal)    [] []  Ophthalmology       Sicca/Cataracts   []  []  Dermatology/musculo-skeletal        GVHD (skin & ROM)        Cancer screening (Derm referral)            [] []  [] []  Dental       GVHD       Routine Care   []  []              Majhail, et al. (2012). Recommended Screening and Preventive Practices for Long-Term Survivors after  Hematopoietic Cell Transplantation. Biology of Blood and Marrow Transplantation, Volume 18,                    Issue 3, March 2012, Pages 348-371.Conditioning/Transplant:-Melphalan 70 mg/m2 over 30 minutes on D-6, *Melphalan dosed reduced to 70 mg/m2 due to age-Fludarabine 40 mg/m2 over 30 minutes every 24hr for 4 doses from D-5 to D -2             -Stem cell infusion day 0 (09/24/22)Follow up on 10/8 with Dr. Valentina Shaggy, [id number] [phone] or [facility] Barile-PuglieseOn the day of this patient's encounter, a total of >40 minutes was personally spent by me.  This does not include any any time spent performing a procedural service.

## 2022-12-16 NOTE — Telephone Encounter
[facility] reports cancellation of LDH due to hemolyzation; placed for redraw

## 2022-12-16 NOTE — Other
 Post-HSCT Follow-Up Pharmacist note Diagnosis: CMMLConditioning:   Fludarabine/Melphalan with post-transplant Cyclophosphamide Transplant Type:10/10 MUD Allogeneic PBSCTTransplant Date (Day 0): 09/24/22 Referring Provider: Chrystine Oiler, MDEncounter Date: 12/16/2022 = D+83Steven DEVARIO [name] is a 72 y.o. year-old male with history of CMML s/p HSCT. Patient is being seen by the clinic pharmacist for medication management included post stem cell transplant, hematological related disorders and GI related disorders .  This visit was completed as a joint-visit with provider present and is aware of the care plan.Subjective: No complaints of diarrhea Notes that nausea is improving, had a couple of episodes of nausea following meals since last visit. Patient reports that he did not take medication for nausea during these episodes. Still has metallic taste in mouth but feels appetite is overall improving. Reports having 2 consistent meals a day with infrequent snacks.   No signs of any rashesHeld tacrolimus prior to morning labs - took AM dose in clinic Assessment and Recommendations:  Summary of Medication Changes and Follow-up Items Discussed with Provider:No changesGVHD (immunosuppression)Tacrolimus: Current dose: 5 mg PO twice daily10/30/24  level: 7.9, continue current doseGoal range: 6-11 ng/mLDosage dispensed: 0.5mg , 1mg , 5mg  capsulesPharmacy dispensed: OPSPost transplant cyclophosphamide: completed inpatient *All immunosuppression is managed by the bone marrow transplant team.Infectious Disease (prophylaxis, monitoring, and active infections)ViralCurrent prophylaxis/treatment: Acyclovir 400mg  PO twice dailyPrevious viral infections: 	HSV/VZV +/?CMV -/?, 10/22 CMV negativeEBV IgG negativeBK virus N/APneumocystis jeuvecii pneumoniaCurrent prophylaxis/treatment: Pentamidine 300mg  inhaled monthly last given 10/1/24Previous PJP infection: N/AT-cell subsetDay +100 (date):Day +180 (date):1 year (date):Toxoplasmosis IgG negativeCurrent prophylaxis/treatment: NoneFungal/MoldCurrent prophylaxis/treatment: Isavuconazole 372mg  PO daily - history of prolonged cytopenias Previous fungal/mold infection: N/A, Hx of gingival changes to voriconazole Drug monitoring: N/AGalactomann - 10/01/22 negFungitell - 10/01/22 < 31Bacterial	Current prophylaxis/treatment: None	Previous bacterial infection: N/A	Drug monitoring: N/A	Blood cultures: N/ASupportive careHyperlipidemia monitoring (triglycerides, Cholesterol, LDL, HDL)Current treatment: None	Prior treatment: Atorvastatin 40mg  PO daily, held with transplantBaseline (09/18/2022): 68 Today (12/16/22): 183Hypertension monitoring (blood pressure)Current treatment: Amlodipine 10mg  PO dailyPrior treatment: Metoprolol and lisinopril on hold since transplant Today (12/16/22):  133/83GastrointestionalStress ulcer prophylaxis: None, one episode of acid refluxNausea/Vomiting: Intermittent nausea, takes ondansetron with good effect. Doesn't find that prochlorperazine has much benefit.Diarrhea/constipation: Continues to have soft stools, loperamidine prnOral care and mucositis: NoneOther: Discussed using plastic utensils for meals at home given ongoing metallic taste in the mouth that affects appetite. Veno-occlusive diseaseCurrent treatment:  Ursodiol 500mg  PO twice daily until day +90Anticoagulation	Current prophylaxis/treatment: None	Previous anticoagulation history: Aspirin 81mg  PO daily, for cardiac ppx - not restartingElectrolyte supplementation:	Current treatment: Magnesium oxide 400mg  BID	Monitoring: Mg 1.5 10/30/24Hypogammaglobulinemia	Current treatment:	Goal: 500-800 ng/dLIVIG last dose:Baseline IgG level:Last IgG level:IgG scheduled levelsDay +100 (date):Day +180 (date):	1 year (date):	Maintenance Therapy Plan: VaccinationsBaseline titers: 	Hepatitis B: 	Pneumococcal: 	VZV 	Measles: , Mumps: , Rubella: 12 month: 14 month: 18 month:	Hep B titer	Pneumococcal titers24 month:Titers drawn 2 months after 24 months:Redose needed:Medication History:Douglas [name] presents to today's visit with their med action plan.  A medication history was obtained from the patient and spouse.Adherence:The patient reports 0 missed doses of medications in the past 1 week(s). The patient does not report the barriers to adherence. Discussed potential outcomes associated with non-adherenceAffordability:The patient denies concerns with affordability of medications at this time. Medication Management:Plan for medication management assistance: An updated medication action plan was not given to the patient, no changesPatient would benefit from continued pharmacy follow-up at future clinic visits for medication managementEducated patient on the above changesPatient did not request any refills todayObjective: UJW:[id number] Results (from the past 168 hour(s)) CBC auto differential  Collection Time: 12/16/22 10:21 AM Result Value Ref Range  WBC 3.1 (  L) 4.0 - 11.0 x1000/?L  RBC 3.40 (L) 4.00 - 6.00 M/?L  Hemoglobin 12.1 (L) 13.2 - 17.1 g/dL  Hematocrit 47.82 (L) 95.62 - 50.00 %  MCV 99.7 80.0 - 100.0 fL  MCH 35.6 (H) 27.0 - 33.0 pg  MCHC 35.7 31.0 - 36.0 g/dL  RDW-CV 13.0 (H) 86.5 - 15.0 %  Platelets 168 150 - 420 x1000/?L  MPV 10.5 8.0 - 12.0 fL  Neutrophils 61.1 39.0 - 72.0 %  Lymphocytes 21.4 17.0 - 50.0 %  Monocytes 13.6 (H) 4.0 - 12.0 %  Eosinophils 2.3 0.0 - 5.0 %  Basophil 1.3 0.0 - 1.4 %  Immature Granulocytes 0.3 0.0 - 1.0 %  nRBC 0.0 0.0 - 1.0 %  Absolute Lymphocyte Count 0.66 0.60 - 3.70 x 1000/?L  Monocyte Absolute Count 0.42 0.00 - 1.00 x 1000/?L  Eosinophil Absolute Count 0.07 0.00 - 1.00 x 1000/?L  Basophil Absolute Count 0.04 0.00 - 1.00 x 1000/?L  Absolute Immature Granulocyte Count 0.01 0.00 - 0.30 x 1000/?L  Absolute nRBC 0.00 0.00 - 1.00 x 1000/?L  ANC (Abs Neutrophil Count) 1.89 (L) 2.00 - 7.60 x 1000/?L HQI:[id number] Results (from the past 168 hour(s)) Comprehensive metabolic panel  Collection Time: 12/16/22 10:21 AM Result Value Ref Range  Sodium 140 136 - 144 mmol/L  Potassium 4.1 3.3 - 5.3 mmol/L  Chloride 108 (H) 98 - 107 mmol/L  CO2 20 20 - 30 mmol/L  Anion Gap 12 7 - 17  Glucose 119 (H) 70 - 100 mg/dL  BUN 19 8 - 23 mg/dL  Creatinine 2.84 1.32 - 1.30 mg/dL  Calcium 9.4 8.8 - 44.0 mg/dL  BUN/Creatinine Ratio 10.2 8.0 - 23.0  Total Protein 6.7 5.9 - 8.3 g/dL  Albumin 4.2 3.6 - 5.1 g/dL  Total Bilirubin 0.3 <=7.2 mg/dL  Alkaline Phosphatase 89 9 - 122 U/L  Alanine Aminotransferase (ALT) 24 9 - 59 U/L  Aspartate Aminotransferase (AST) 27 10 - 35 U/L  Globulin 2.5 2.0 - 3.9 g/dL  A/G Ratio 1.7 1.0 - 2.2  AST/ALT Ratio 1.1 Reference Range Not Established  eGFR (Creatinine) >60 >=60 mL/min/1.54m2 LDH: No results found for this or any previous visit (from the past 168 hour(s)).Patient Education: The following education was provided for: Initiation, modification, or discontinuation of drug therapy, Assessment of efficacy and safety of drug therapy, and Adverse effect presentation, evaluation, and monitoring during today?s visit to the patient and spouse regarding medication management: purpose of each medication, side effects of medications, and importance of adherence with the regimen(s) Time Spent: 15 minutes, face-to-face consultPATIENT AND/OR CAREGIVER VERBALIZED UNDERSTANDING OF CARE PLAN: yesPATIENT ADVISED TO CALL BACK WITH QUESTIONS, CONCERNS, OR CHANGE IN SYMPTOMS.Jerelene Redden, PharmD 12/16/22 10:07 AM

## 2022-12-16 NOTE — Telephone Encounter
 Hemolyzed LD noted

## 2022-12-16 NOTE — Progress Notes
 Day + 83 post MUD for his CMML.Reports fatigue; has good appetite.No fever/chills; no nausea/vomiting/diarrhea.Skin intact; no rash/edema noted.No SOB; no cough nor nasal congestion.RDLH dressing and claves changed.Labs drawn-no hydration/repletion needed.Given pentamidine treatment by Rt.Seen by Amy-no changes made at this time.RTC in one week.

## 2022-12-17 LAB — CYTOMEGALOVIRUS QUANTITATIVE PCR, PLASMA     (BH GH L LMW YH): BKR CMV QUANTITATIVE PCR: NOT DETECTED

## 2022-12-22 ENCOUNTER — Encounter: Admit: 2022-12-22 | Payer: PRIVATE HEALTH INSURANCE

## 2022-12-22 ENCOUNTER — Inpatient Hospital Stay: Admit: 2022-12-22 | Discharge: 2022-12-22 | Payer: MEDICARE

## 2022-12-22 ENCOUNTER — Ambulatory Visit: Admit: 2022-12-22 | Payer: MEDICARE | Attending: Pharmacist Clinician (PhC)/ Clinical Pharmacy Specialist

## 2022-12-22 ENCOUNTER — Ambulatory Visit: Admit: 2022-12-22 | Payer: MEDICARE | Attending: Medical Oncology

## 2022-12-22 VITALS — BP 129/76 | HR 88 | Temp 98.00000°F | Resp 16 | Wt 199.5 lb

## 2022-12-22 DIAGNOSIS — I10 Essential (primary) hypertension: Secondary | ICD-10-CM

## 2022-12-22 DIAGNOSIS — C9200 Acute myeloblastic leukemia, not having achieved remission: Secondary | ICD-10-CM

## 2022-12-22 DIAGNOSIS — Z8249 Family history of ischemic heart disease and other diseases of the circulatory system: Secondary | ICD-10-CM

## 2022-12-22 DIAGNOSIS — Z9484 Stem cells transplant status: Secondary | ICD-10-CM

## 2022-12-22 DIAGNOSIS — C9110 Chronic lymphocytic leukemia of B-cell type not having achieved remission: Secondary | ICD-10-CM

## 2022-12-22 DIAGNOSIS — E785 Hyperlipidemia, unspecified: Secondary | ICD-10-CM

## 2022-12-22 DIAGNOSIS — N400 Enlarged prostate without lower urinary tract symptoms: Secondary | ICD-10-CM

## 2022-12-22 DIAGNOSIS — Z883 Allergy status to other anti-infective agents status: Secondary | ICD-10-CM

## 2022-12-22 DIAGNOSIS — C4491 Basal cell carcinoma of skin, unspecified: Secondary | ICD-10-CM

## 2022-12-22 DIAGNOSIS — C931 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: Secondary | ICD-10-CM

## 2022-12-22 DIAGNOSIS — H409 Unspecified glaucoma: Secondary | ICD-10-CM

## 2022-12-22 DIAGNOSIS — F419 Anxiety disorder, unspecified: Secondary | ICD-10-CM

## 2022-12-22 DIAGNOSIS — I119 Hypertensive heart disease without heart failure: Secondary | ICD-10-CM

## 2022-12-22 DIAGNOSIS — F32A Depression: Secondary | ICD-10-CM

## 2022-12-22 DIAGNOSIS — E782 Mixed hyperlipidemia: Secondary | ICD-10-CM

## 2022-12-22 DIAGNOSIS — Z79899 Other long term (current) drug therapy: Secondary | ICD-10-CM

## 2022-12-22 DIAGNOSIS — R0609 Other forms of dyspnea: Secondary | ICD-10-CM

## 2022-12-22 LAB — CBC WITH AUTO DIFFERENTIAL
BKR WAM ABSOLUTE IMMATURE GRANULOCYTES.: 0 x 1000/ÂµL (ref 0.00–0.30)
BKR WAM ABSOLUTE LYMPHOCYTE COUNT.: 0.72 x 1000/ÂµL (ref 0.60–3.70)
BKR WAM ABSOLUTE NRBC (2 DEC): 0 x 1000/ÂµL (ref 0.00–1.00)
BKR WAM ANC (ABSOLUTE NEUTROPHIL COUNT): 2.19 x 1000/ÂµL (ref 2.00–7.60)
BKR WAM BASOPHIL ABSOLUTE COUNT.: 0.03 x 1000/ÂµL (ref 0.00–1.00)
BKR WAM BASOPHILS: 0.9 % (ref 0.0–1.4)
BKR WAM EOSINOPHIL ABSOLUTE COUNT.: 0.03 x 1000/ÂµL (ref 0.00–1.00)
BKR WAM EOSINOPHILS: 0.9 % (ref 0.0–5.0)
BKR WAM HEMATOCRIT (2 DEC): 35.5 % — ABNORMAL LOW (ref 38.50–50.00)
BKR WAM HEMOGLOBIN: 12 g/dL — ABNORMAL LOW (ref 13.2–17.1)
BKR WAM IMMATURE GRANULOCYTES: 0 % (ref 0.0–1.0)
BKR WAM LYMPHOCYTES: 21.2 % (ref 17.0–50.0)
BKR WAM MCH (PG): 33.8 pg — ABNORMAL HIGH (ref 27.0–33.0)
BKR WAM MCHC: 33.8 g/dL (ref 31.0–36.0)
BKR WAM MCV: 100 fL (ref 80.0–100.0)
BKR WAM MONOCYTE ABSOLUTE COUNT.: 0.43 x 1000/ÂµL (ref 0.00–1.00)
BKR WAM MONOCYTES: 12.6 % — ABNORMAL HIGH (ref 4.0–12.0)
BKR WAM MPV: 10.4 fL (ref 8.0–12.0)
BKR WAM NEUTROPHILS: 64.4 % (ref 39.0–72.0)
BKR WAM NUCLEATED RED BLOOD CELLS: 0 % (ref 0.0–1.0)
BKR WAM PLATELETS: 163 x1000/ÂµL (ref 150–420)
BKR WAM RDW-CV: 15.1 % — ABNORMAL HIGH (ref 11.0–15.0)
BKR WAM RED BLOOD CELL COUNT.: 3.55 M/ÂµL — ABNORMAL LOW (ref 4.00–6.00)
BKR WAM WHITE BLOOD CELL COUNT: 3.4 x1000/ÂµL — ABNORMAL LOW (ref 4.0–11.0)

## 2022-12-22 LAB — PT/INR AND PTT (BH GH L LMW YH)
BKR INR: 1.05 (ref 0.86–1.13)
BKR PARTIAL THROMBOPLASTIN TIME: 23.9 s (ref 23.0–31.0)
BKR PROTHROMBIN TIME: 11.5 s (ref 9.6–12.3)

## 2022-12-22 LAB — IMMUNOGLOBULINS IGG, IGA, IGM
BKR IGA: 69 mg/dL — ABNORMAL LOW (ref 70–470)
BKR IGG: 856 mg/dL (ref 700–1600)
BKR IGM: 20 mg/dL — ABNORMAL LOW (ref 40–230)

## 2022-12-22 LAB — TRIGLYCERIDES: BKR TRIGLYCERIDES: 224 mg/dL — ABNORMAL HIGH

## 2022-12-22 LAB — PHOSPHORUS     (BH GH L LMW YH): BKR PHOSPHORUS: 2.8 mg/dL (ref 2.2–4.5)

## 2022-12-22 LAB — COMPREHENSIVE METABOLIC PANEL
BKR A/G RATIO: 1.8 (ref 1.0–2.2)
BKR ALANINE AMINOTRANSFERASE (ALT): 21 U/L (ref 9–59)
BKR ALBUMIN: 4.3 g/dL (ref 3.6–5.1)
BKR ALKALINE PHOSPHATASE: 88 U/L (ref 9–122)
BKR ANION GAP: 12 (ref 7–17)
BKR ASPARTATE AMINOTRANSFERASE (AST): 19 U/L (ref 10–35)
BKR AST/ALT RATIO: 0.9
BKR BILIRUBIN TOTAL: 0.3 mg/dL (ref <=1.2)
BKR BLOOD UREA NITROGEN: 18 mg/dL (ref 8–23)
BKR BUN / CREAT RATIO: 14.1 (ref 8.0–23.0)
BKR CALCIUM: 9.3 mg/dL (ref 8.8–10.2)
BKR CHLORIDE: 106 mmol/L (ref 98–107)
BKR CO2: 21 mmol/L (ref 20–30)
BKR CREATININE: 1.28 mg/dL (ref 0.40–1.30)
BKR EGFR, CREATININE (CKD-EPI 2021): 60 mL/min/{1.73_m2} (ref >=60)
BKR GLOBULIN: 2.4 g/dL (ref 2.0–3.9)
BKR GLUCOSE: 111 mg/dL — ABNORMAL HIGH (ref 70–100)
BKR POTASSIUM: 4.6 mmol/L (ref 3.3–5.3)
BKR PROTEIN TOTAL: 6.7 g/dL (ref 5.9–8.3)
BKR SODIUM: 139 mmol/L (ref 136–144)

## 2022-12-22 LAB — RETICULOCYTES
BKR WAM IRF: 9.9 % (ref 3.0–15.9)
BKR WAM RETICULOCYTE - ABS (3 DEC): 0.034 10Ë6 cells/uL (ref 0.023–0.140)
BKR WAM RETICULOCYTE COUNT PCT (1 DEC): 1 % (ref 0.6–2.7)
BKR WAM RETICULOCYTE HGB EQUIVALENT: 38.6 pg — ABNORMAL HIGH (ref 28.2–35.7)

## 2022-12-22 LAB — TACROLIMUS LEVEL     (BH GH L LMW YH): BKR TACROLIMUS BLOOD: 8.8 ng/mL

## 2022-12-22 LAB — MAGNESIUM: BKR MAGNESIUM: 1.6 mg/dL — ABNORMAL LOW (ref 1.7–2.4)

## 2022-12-22 LAB — HAPTOGLOBIN: BKR HAPTOGLOBIN: 136 mg/dL (ref 30–200)

## 2022-12-22 LAB — LACTATE DEHYDROGENASE: BKR LACTATE DEHYDROGENASE: 250 U/L — ABNORMAL HIGH (ref 122–241)

## 2022-12-22 NOTE — Progress Notes
 Pt here for labs/possible transfusion(s) and MD f/u. Is D+89 MUD for CMML. Reports feeling well overall, did not offer any acute complaints. Reports ongoing eye floaters that is unchanged for the last couple of weeks. VSS. RDL Hickman dressing and claves changed per protocol, positive brisk blood return noted. Labs drawn per orders and no interventions required today. Pt seen by MD and cleared for discharge to home. All questions/ comments/ concerns addressed. Pt due to return to clinic on 11/13 for labs and APP f/u. Pt discharged to home w/ spouse in stable condition.Magnus Ivan, RN

## 2022-12-22 NOTE — Other
 Post-HSCT Follow-Up Pharmacist note Diagnosis: CMMLConditioning:   Fludarabine/Melphalan with post-transplant Cyclophosphamide Transplant Type:10/10 MUD Allogeneic PBSCTTransplant Date (Day 0): 09/24/22 Referring Provider: Chrystine Oiler, MDEncounter Date: 12/22/2022 = D+89Steven MALEKE FERIA is a 72 y.o. year-old male with history of CMML s/p HSCT. Patient is being seen by the clinic pharmacist for medication management included post stem cell transplant, hematological related disorders and GI related disorders .  This visit was completed as a joint-visit with provider present and is aware of the care plan.Subjective: Having increase protein floaters in his left eye.  Spoke to opthomologist in [place] and was recommended to see a provider up her for dilationHeld tacrolimus prior to morning labs - took AM dose in clinic Assessment and Recommendations:  Summary of Medication Changes and Follow-up Items Discussed with Provider:Tacrolimus 8.8 - continue current doseUrsodiol discontinued today, LFTs normalGVHD (immunosuppression)Tacrolimus: Current dose: 5 mg PO twice daily11/05/24  level: 8.8, continue current doseGoal range: 6-11 ng/mLDosage dispensed: 0.5mg , 1mg , 5mg  capsulesPharmacy dispensed: OPSPost transplant cyclophosphamide: completed inpatient *All immunosuppression is managed by the bone marrow transplant team.Infectious Disease (prophylaxis, monitoring, and active infections)ViralCurrent prophylaxis/treatment: Acyclovir 400mg  PO twice dailyPrevious viral infections: 	HSV/VZV +/?CMV -/?, 10/30 CMV negativeEBV IgG negativeBK virus N/APneumocystis jeuvecii pneumoniaCurrent prophylaxis/treatment: Pentamidine 300mg  inhaled monthly last given 10/30/24Previous PJP infection: N/AT-cell subsetDay +100 (12/22/22): pendingDay +180 (date):1 year (date):Toxoplasmosis IgG negativeCurrent prophylaxis/treatment: NoneFungal/MoldCurrent prophylaxis/treatment: Isavuconazole 372mg  PO daily - history of prolonged cytopenias Previous fungal/mold infection: N/A, Hx of gingival changes to voriconazole Drug monitoring: N/AGalactomann - 10/01/22 negFungitell - 10/01/22 < 31Bacterial	Current prophylaxis/treatment: None	Previous bacterial infection: N/A	Drug monitoring: N/A	Blood cultures: N/ASupportive careHyperlipidemia monitoring (triglycerides, Cholesterol, LDL, HDL)Current treatment: None	Prior treatment: Atorvastatin 40mg  PO daily, held with transplantBaseline (09/18/2022): 68 Today (12/22/22): 224Hypertension monitoring (blood pressure)Current treatment: Amlodipine 10mg  PO dailyPrior treatment: Metoprolol and lisinopril on hold since transplant Today (12/22/22):  129/76GastrointestionalStress ulcer prophylaxis: None, one episode of acid refluxNausea/Vomiting: Intermittent nausea, takes ondansetron with good effect. Doesn't find that prochlorperazine has much benefit.Diarrhea/constipation: Continues to have soft stools, loperamidine prnOral care and mucositis: NoneOther: Discussed using plastic utensils for meals at home given ongoing metallic taste in the mouth that affects appetite. Veno-occlusive diseaseCurrent treatment:  Ursodiol 500mg  PO twice daily until day +90, d/c 11/5/24Anticoagulation	Current prophylaxis/treatment: None	Previous anticoagulation history: Aspirin 81mg  PO daily, for cardiac ppx - not restartingElectrolyte supplementation:	Current treatment: Magnesium oxide 400mg  BID	Monitoring: Mg 1.6 11/05/24Hypogammaglobulinemia	Current treatment:	Goal: 500-800 ng/dLIVIG last dose:Baseline IgG level:Last IgG level:IgG scheduled levelsDay +100 (12/22/22): 856Day +180 (date):	1 year (date):	Maintenance Therapy Plan: VaccinationsBaseline titers: 	Hepatitis B: 	Pneumococcal: 	VZV 	Measles: , Mumps: , Rubella: 12 month: 14 month: 18 month:	Hep B titer	Pneumococcal titers24 month:Titers drawn 2 months after 24 months:Redose needed:Medication History:Quenten W Zeck presents to today's visit with their med action plan.  A medication history was obtained from the patient and spouse.Adherence:The patient reports 0 missed doses of medications in the past 1 week(s). The patient does not report the barriers to adherence. Discussed potential outcomes associated with non-adherenceAffordability:The patient denies concerns with affordability of medications at this time. Medication Management:Plan for medication management assistance: An updated medication action plan was not given to the patient, no changesPatient would benefit from continued pharmacy follow-up at future clinic visits for medication managementEducated patient on the above changesPatient did not request any refills todayObjective: AVW:UJWJXB Results (from the past 168 hour(s)) CBC auto differential  Collection Time: 12/22/22 11:26 AM Result Value Ref Range  WBC 3.4 (L) 4.0 - 11.0 x1000/?L  RBC 3.55 (L) 4.00 - 6.00 M/?L  Hemoglobin 12.0 (L) 13.2 - 17.1 g/dL  Hematocrit 14.78 (L)  38.50 - 50.00 %  MCV 100.0 80.0 - 100.0 fL  MCH 33.8 (H) 27.0 - 33.0 pg  MCHC 33.8 31.0 - 36.0 g/dL  RDW-CV 16.1 (H) 09.6 - 15.0 %  Platelets 163 150 - 420 x1000/?L  MPV 10.4 8.0 - 12.0 fL  Neutrophils 64.4 39.0 - 72.0 %  Lymphocytes 21.2 17.0 - 50.0 %  Monocytes 12.6 (H) 4.0 - 12.0 %  Eosinophils 0.9 0.0 - 5.0 %  Basophil 0.9 0.0 - 1.4 %  Immature Granulocytes 0.0 0.0 - 1.0 %  nRBC 0.0 0.0 - 1.0 %  Absolute Lymphocyte Count 0.72 0.60 - 3.70 x 1000/?L  Monocyte Absolute Count 0.43 0.00 - 1.00 x 1000/?L  Eosinophil Absolute Count 0.03 0.00 - 1.00 x 1000/?L  Basophil Absolute Count 0.03 0.00 - 1.00 x 1000/?L  Absolute Immature Granulocyte Count 0.00 0.00 - 0.30 x 1000/?L  Absolute nRBC 0.00 0.00 - 1.00 x 1000/?L  ANC (Abs Neutrophil Count) 2.19 2.00 - 7.60 x 1000/?L EAV:WUJWJX Results (from the past 168 hour(s)) Comprehensive metabolic panel  Collection Time: 12/22/22 11:26 AM Result Value Ref Range  Sodium 139 136 - 144 mmol/L  Potassium 4.6 3.3 - 5.3 mmol/L  Chloride 106 98 - 107 mmol/L  CO2 21 20 - 30 mmol/L  Anion Gap 12 7 - 17  Glucose 111 (H) 70 - 100 mg/dL  BUN 18 8 - 23 mg/dL  Creatinine 9.14 7.82 - 1.30 mg/dL  Calcium 9.3 8.8 - 95.6 mg/dL  BUN/Creatinine Ratio 21.3 8.0 - 23.0  Total Protein 6.7 5.9 - 8.3 g/dL  Albumin 4.3 3.6 - 5.1 g/dL  Total Bilirubin 0.3 <=0.8 mg/dL  Alkaline Phosphatase 88 9 - 122 U/L  Alanine Aminotransferase (ALT) 21 9 - 59 U/L  Aspartate Aminotransferase (AST) 19 10 - 35 U/L  Globulin 2.4 2.0 - 3.9 g/dL  A/G Ratio 1.8 1.0 - 2.2  AST/ALT Ratio 0.9 Reference Range Not Established  eGFR (Creatinine) 60 >=60 mL/min/1.78m2 LDH: Recent Results (from the past 168 hour(s)) Lactate dehydrogenase  Collection Time: 12/22/22 11:26 AM Result Value Ref Range  LD 250 (H) 122 - 241 U/L Patient Education: The following education was provided for: Initiation, modification, or discontinuation of drug therapy, Assessment of efficacy and safety of drug therapy, and Adverse effect presentation, evaluation, and monitoring during today?s visit to the patient and spouse regarding medication management: purpose of each medication, side effects of medications, and importance of adherence with the regimen(s) Time Spent: 15 minutes, face-to-face consultPATIENT AND/OR CAREGIVER VERBALIZED UNDERSTANDING OF CARE PLAN: yesPATIENT ADVISED TO CALL BACK WITH QUESTIONS, CONCERNS, OR CHANGE IN SYMPTOMS.Jorene Guest, PharmD BCPS BCOP11/05/24

## 2022-12-22 NOTE — Progress Notes
 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 Sherrell Puller INTERIM HISTORY: Douglas [name] is day 23 post transplant. Overall, he is doing well. He reports his shoulder pain has improved. He reports pain in arms for 2 weeks. He is not going for walk every day. He reports his appetite has improved. He has been eating and drinking better. His bowel movements are normal. He denies nausea. He reports dry skin and denies any rash. He denies cold, fever, or interim infection.  We reviewed the current medication list and made the necessary changes to chart. HPI:   [name] has a hx of Hypertension, hyperlipidemia, basal cell cancer of the skin, iron deficiency anemia.  The patient was evaluated for cytopenias in 2021 in [place].  Initial evaluation from [facility], source records are not available today.  Per the patient's subsequent records, he initially presented with leukopenia, thrombocytopenia, and monocytosis.  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  Initial bone marrow was performed on August 24, 2019, was reviewed at [facility] with findings of multilineage dysplasia.  The marrow was 40% cellular.  Megakaryocytes showed frequent dysplasia with  hyperchromatic forms and clustering.  Blasts were not increased.  A FISH panel for MDS  loci was negative.  Karyotype was normal.  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  The patient was observed and was without any treatment or transfusions.  In 2023, he had an acute drop in hemoglobin, was found to be iron deficient.  Workup for this did not show a clear focus of blood loss or bleeding.  The patient has continued evaluation and established care with Dr. Sherrell Puller in June 2023 as he spends some summers in [place].  Of note, there is a label of CLL in the patient's chart, which I suspect was a typo, and find no evidence that support that diagnosis.  Dr. Dema Severin assessment in June 2023 per the CPSS molecular score being considered intermediate one with a low risk of development of leukemia and median survival over 5 years.  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  The patient had a marrow performed at [facility] on 29th with CD34 stain showing 10% to 15% blasts focally more than 20%.  Marrow was hypercellular at 70%.  Dysplastic megakaryocytes again appreciated.  Molecular studies are pending.  Given the findings, treatment with the decitabine and venetoclax was recommended.  A referral has been placed for urgent EGD colonoscopy given the low iron concern for occult source of bleeding.He started treatment last week which he is tolerated well. He received 5-6 red blood cell transfusion in the past several month. He was suffering weakness and dyspnea which is now improved after transfusion. He does not have a significant infectious history, he did have COVID-19 in the past from which he recovered. He has hypertension and hyperlipidemia managed by cardiologist in [place]. He reports a negative stress test in the past. He has had number of skin cancers and has had several MOHS procedures. He is followed with a urologist for some BPH and abnormal PSA. He has had 2 prostate biopsies which was negative. He carries a diagnosis of  iron deficiency and require transfusion in [place] after a significant and acute drop in hemoglobin. There was no clinical evidence of bleeding. Endoscopy and colonoscopy are planned to exclude a source of bleeding. Douglas [name] is a 72 yo gentlemen that lives with is wife in [place].  He is currently spending the summer in [place] visiting family.  He has 2 daughters and 5 grandchildren.  Retired, having worked for [employer].  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  He is a social drinker of 1-2 beers per week, denies illicit drug use and is a non smoker.  Covid-19 vaccine series completed. 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)   CLL (chronic lymphocytic leukemia) (HC Code)   Depression   DOE (dyspnea on exertion)   Enlarged prostate   Essential hypertension   Family history of ischemic heart disease   Glaucoma   Hypertensive heart disease without heart failure   Mixed hyperlipidemia  Surgical HistoryPast Surgical History: Procedure Laterality Date  ADENOIDECTOMY    BONE MARROW BIOPSY    PROSTATE BIOPSY    TONSILLECTOMY    VASCULAR SURGERY   AllergiesAllergies Allergen Reactions  Voriconazole Other (See Comments)   Gingival edema/redness making it difficult to bite down  Current Outpatient Medications:   acyclovir (ZOVIRAX) 400 mg tablet, Take 1 tablet (400 mg total) by mouth every 12 (twelve) hours., Disp: 180 tablet, Rfl: 3  amLODIPine (NORVASC) 10 mg tablet, Take 1 tablet (10 mg total) by mouth., Disp: , Rfl:   amLODIPine (NORVASC) 10 mg tablet, Take 1 tablet (10 mg total) by mouth daily., Disp: , Rfl:   finasteride (PROSCAR) 5 mg tablet, TAKE 1 TABLET BY MOUTH DAILY FOR ENLARGED PROSTATE WITH URINATION PROBLEM TAKE WITH OR WITHOUT MEALS, Disp: , Rfl:   folic acid (FOLVITE) 1 mg tablet, Take 1 tablet (1 mg total) by mouth daily., Disp: 30 tablet, Rfl: 11  isavuconazonium (CRESEMBA) 186 mg capsule, Take 2 capsules (372 mg total) by mouth daily., Disp: 56 capsule, Rfl: 11  latanoprost (XALATAN) 0.005 % ophthalmic solution, Place 1 drop into both eyes nightly., Disp: , Rfl:   magnesium oxide 400 mg magnesium Cap, Take 400 mg by mouth daily., Disp: , Rfl:   Miscellaneous Medical Supply, Please provide rolling walker, Use as directed., Disp: 1 each, Rfl: 0  multivitamin tablet, Take 1 tablet by mouth daily., Disp: 30 tablet, Rfl: 11  ondansetron (ZOFRAN) 8 mg tablet, Take 1 tablet (8 mg total) by mouth every 8 (eight) hours as needed for nausea or vomiting., Disp: 30 tablet, Rfl: 2  pentamidine (PENTAM) 300 mg inhalation solution, Inhale 6 mLs (300 mg total) into the lungs every 28 days. Last given 11/17/22, Disp: , Rfl:   sodium chloride 0.9 % flush, Use 10 mLs by IV Push route daily. In both lumens, Disp: 600 mL, Rfl: 11  tacrolimus (PROGRAF) 0.5 mg Immediate Release capsule, Take 1 capsule (0.5 mg total) by mouth 2 (two) times daily. (Patient not taking: Reported on 11/24/2022), Disp: 60 capsule, Rfl: 11  tacrolimus (PROGRAF) 1 mg immediate release capsule, Take 1 capsule (1 mg total) by mouth 2 (two) times daily. (Patient not taking: Reported on 11/24/2022), Disp: 60  capsule, Rfl: 11  tacrolimus (PROGRAF) 5 mg Immediate Release capsule, Take 1 capsule (5 mg total) by mouth 2 (two) times daily. (Patient taking differently: Take 1 capsule (5 mg total) by mouth 2 (two) times daily. 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 (HC Code)   CLL (chronic lymphocytic leukemia) (HC Code)   Depression   DOE (dyspnea on exertion)   Enlarged prostate   Essential hypertension   Family history of ischemic heart disease   Glaucoma   Hypertensive heart disease without heart failure   Mixed hyperlipidemia  Family History Problem Relation Age of Onset  Breast cancer Mother   Liver cancer Mother   Heart disease Father   Prostate cancer Father   Breast cancer Sister   Thyroid disease Sister   Hearing loss Sister   Diabetes Daughter   No Known Problems Daughter   No Known Problems Grandchild  Social History Socioeconomic History  Marital status: Married   Spouse name: Not on file  Number of children: Not on file  Years of education: Not on file  Highest education level: Not on file Occupational History  Not on file Tobacco Use  Smoking status: Never  Smokeless tobacco: Never Vaping Use  Vaping Use: Never used Substance and Sexual Activity  Alcohol use: Yes   Alcohol/week: 2.0 standard drinks of alcohol   Types: 2 Cans of beer per week   Comment: social drinker once a week  Drug use: Never  Sexual activity: Not on file Other Topics Concern  Not on file Social History Narrative  Not on file Social Determinants of Health Financial Resource Strain: Low Risk  (08/05/2021)  Overall Financial Resource Strain (CARDIA)   Difficulty of Paying Living Expenses: Not very hard Food Insecurity: No Food Insecurity (09/18/2022)  Hunger Vital Sign   Worried About Running Out of Food in the Last Year: Never true   Ran Out of Food in the Last Year: Never true Transportation Needs: No Transportation Needs (09/18/2022)  PRAPARE - Designer, jewellery (Medical): No   Lack of Transportation (Non-Medical): No Physical Activity: Not on file Stress: Not on file Social Connections: Not on file Intimate Partner Violence: Not on file Housing Stability: Low Risk  (09/18/2022)  Housing Stability   Housing Stability: I have a steady place to live   Housing Stability: Not on file EXAM:Temp:  [98.7 ?F (37.1 ?C)] 98.7 ?F (37.1 ?C)Pulse:  [83] 83Resp:  [18] 18BP: (134)/(85) 134/85SpO2:  [99 %] 99 %General Appearance:He is heavy set, appears wellHEENT:  Mucous membranes moist, No oral lesions, no erythema, teeth are adequate repair Lungs: ClearCor: RRR, no m/r/gAbd: soft nontender, no hepatosplenomegaly, Extr: trace edema.  Skin: some follicular erythema noted on the chest and abdomen, which is mild. Lymph Nodes - no palpable adenopathy. Neurologic: mentation appears normal, normal speech, strength symmetric. LABSLab Results Component Value Date  WBC 3.0 (L) 11/24/2022  HGB 11.3 (L) 11/24/2022  HCT 33.60 (L) 11/24/2022  MCV 98.5 11/24/2022  PLT 181 11/24/2022 Comprehensive Metabolic Panel:Lab Results Component Value Date  GLU 139 (H) 11/24/2022  BUN 16 11/24/2022  CREATININE 1.05 11/24/2022  NA 138 11/24/2022  K 3.9 11/24/2022  CL 105 11/24/2022  CO2 20 11/24/2022  ALBUMIN 4.1 11/24/2022  PROT 6.8 11/24/2022  BILITOT 0.3 11/24/2022  ALKPHOS 91 11/24/2022  ALT 21 11/24/2022  GLOB 2.7 11/24/2022  CALCIUM 9.3 11/24/2022 Results in Past 7 DaysResult Component Current Result LD 209 (11/24/2022) ]A/P:  NEVAAN BUNTON is 72 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  His disease is characterized by minor cytopenias over a long period of observation with recently worsened anemia, thrombocytopenia, and a marrow with increased blasts consistent with disease progression. 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 Therapy was   started with decitabine and venetoclax, and transplantation  recommended given the now increased risk of shorten survival. The patient has had a formal  search of the registry and a 10/10 match was identified.A/P: Day +61  no evidence of gvhd. 1) Disease - Bone marrow biopsy September 10th, this was consistent with remission by morphology. Donor percentage was 100%, FISH and gene panel are normal.  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 Goal is tac 5-10, check weekly ldh, haptoglobin, triglycerides. 3) Heme - Donor, Recipient both blood type A+ve. Blood counts looks good today. 4) ID - Prophylaxis with acyclovir, pentamidine, Cresemba. He is not at risk for CMV. 5) Hypertension - He is on continued Norvasc. 6) Liver: Continue Ursodiol, monitor mild LFT changes. Normal today. 7) History of skin cancer: Needs to be addressed after 3 month time point.8) Abnormal spine, lesion on MRI pre- transplant: Planned follow up MRI in 3-4 months. Dr. Chrystine Oiler MD Scribed for Chrystine Oiler, MD by Simona Huh, medical scribe November 24, 2022 The documentation recorded by the scribe accurately reflects the services I personally performed and the decisions made by me. I reviewed and confirmed all material entered and/or pre-charted by the scribe.

## 2022-12-22 NOTE — Progress Notes
 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 Sherrell Puller INTERIM HISTORY: Douglas [name] is day 75 post transplant. He would like to return home to [place] after the new year. Overall, he is doing well. He reports changes eye vision and would like to see ophthalmologist. He reports dry skin, denies rash. He has been eating and drinking better. He denies cold, fever, or interim infection. We reviewed the current medication list and made the necessary changes to chart. HPI:   Douglas [name] has a hx of Hypertension, hyperlipidemia, basal cell cancer of the skin, iron deficiency anemia.  The patient was evaluated for cytopenias in 2021 in [place].  Initial evaluation from [facility], source records are not available today.  Per the patient's subsequent records, he initially presented with leukopenia, thrombocytopenia, and monocytosis.  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  Initial bone marrow was performed on August 24, 2019, was reviewed at [facility] with findings of multilineage dysplasia.  The marrow was 40% cellular.  Megakaryocytes showed frequent dysplasia with  hyperchromatic forms and clustering.  Blasts were not increased.  A FISH panel for MDS  loci was negative.  Karyotype was normal.  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  The patient was observed and was without any treatment or transfusions.  In 2023, he had an acute drop in hemoglobin, was found to be iron deficient.  Workup for this did not show a clear focus of blood loss or bleeding.  The patient has continued evaluation and established care with Dr. Sherrell Puller in June 2023 as he spends some summers in [place].  Of note, there is a label of CLL in the patient's chart, which I suspect was a typo, and find no evidence that support that diagnosis.  Dr. Dema Severin assessment in June 2023 per the CPSS molecular score being considered intermediate one with a low risk of development of leukemia and median survival over 5 years.  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  The patient had a marrow performed at [facility] on 29th with CD34 stain showing 10% to 15% blasts focally more than 20%.  Marrow was hypercellular at 70%.  Dysplastic megakaryocytes again appreciated.  Molecular studies are pending.  Given the findings, treatment with the decitabine and venetoclax was recommended.  A referral has been placed for urgent EGD colonoscopy given the low iron concern for occult source of bleeding.He started treatment last week which he is tolerated well. He received 5-6 red blood cell transfusion in the past several month. He was suffering weakness and dyspnea which is now improved after transfusion. He does not have a significant infectious history, he did have COVID-19 in the past from which he recovered. He has hypertension and hyperlipidemia managed by cardiologist in [place]. He reports a negative stress test in the past. He has had number of skin cancers and has had several MOHS procedures. He is followed with a urologist for some BPH and abnormal PSA. He has had 2 prostate biopsies which was negative. He carries a diagnosis of iron deficiency and require transfusion in [place] after a significant and acute drop in hemoglobin.  There was no clinical evidence of bleeding. Endoscopy and colonoscopy are planned to exclude a source of bleeding. Douglas [name] is a 72 yo gentlemen that lives with is wife in [place].  He is currently spending the summer in [place] visiting family.  He has 2 daughters and 5 grandchildren.  Retired, having worked for [employer].  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  He is a social drinker of 1-2 beers per week, denies illicit drug use and is a non smoker.  Covid-19 vaccine series completed. 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)   CLL (chronic lymphocytic leukemia) (HC Code)   Depression   DOE (dyspnea on exertion)   Enlarged prostate   Essential hypertension   Family history of ischemic heart disease   Glaucoma   Hypertensive heart disease without heart failure   Mixed hyperlipidemia  Surgical HistoryPast Surgical History: Procedure Laterality Date  ADENOIDECTOMY    BONE MARROW BIOPSY    PROSTATE BIOPSY    TONSILLECTOMY    VASCULAR SURGERY   AllergiesAllergies Allergen Reactions  Voriconazole Other (See Comments)   Gingival edema/redness making it difficult to bite down  Current Outpatient Medications:   acyclovir (ZOVIRAX) 400 mg tablet, Take 1 tablet (400 mg total) by mouth every 12 (twelve) hours., Disp: 180 tablet, Rfl: 3  amLODIPine (NORVASC) 10 mg tablet, Take 1 tablet (10 mg total) by mouth., Disp: , Rfl:   amLODIPine (NORVASC) 10 mg tablet, Take 1 tablet (10 mg total) by mouth daily., Disp: , Rfl:   finasteride (PROSCAR) 5 mg tablet, TAKE 1 TABLET BY MOUTH DAILY FOR ENLARGED PROSTATE WITH URINATION PROBLEM TAKE WITH OR WITHOUT MEALS, Disp: , Rfl:   folic acid (FOLVITE) 1 mg tablet, Take 1 tablet (1 mg total) by mouth daily., Disp: 30 tablet, Rfl: 11  isavuconazonium (CRESEMBA) 186 mg capsule, Take 2 capsules (372 mg total) by mouth daily., Disp: 56 capsule, Rfl: 11  latanoprost (XALATAN) 0.005 % ophthalmic solution, Place 1 drop into both eyes nightly., Disp: , Rfl:   magnesium oxide 400 mg magnesium Cap, Take 400 mg by mouth daily., Disp: , Rfl:   Miscellaneous Medical Supply, Please provide rolling walker, Use as directed., Disp: 1 each, Rfl: 0  multivitamin tablet, Take 1 tablet by mouth daily., Disp: 30 tablet, Rfl: 11  ondansetron (ZOFRAN) 8 mg tablet, Take 1 tablet (8 mg total) by mouth every 8 (eight) hours as needed for nausea or vomiting. (Patient not taking: Reported on 12/01/2022), Disp: 30 tablet, Rfl: 2  pentamidine (PENTAM) 300 mg inhalation solution, Inhale 6 mLs (300 mg total) into the lungs every 28 days. Last given 11/17/22, Disp: , Rfl:   sodium chloride 0.9 % flush, Use 10 mLs by IV Push route daily. In both lumens, Disp: 600 mL, Rfl: 11  tacrolimus (PROGRAF) 5 mg Immediate Release capsule, Take 1 capsule (5 mg total) by mouth 2 (two) times daily. (Patient taking differently: Take 1 capsule (5 mg total) by mouth 2 (two) times daily. 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 (HC Code)   CLL (chronic lymphocytic leukemia) (HC Code)   Depression   DOE (dyspnea on exertion)   Enlarged prostate   Essential hypertension   Family history of ischemic heart disease   Glaucoma   Hypertensive heart disease without heart failure   Mixed hyperlipidemia  Family History Problem Relation Age of Onset  Breast cancer Mother   Liver cancer Mother   Heart disease Father   Prostate cancer Father   Breast cancer Sister Thyroid disease Sister   Hearing loss Sister   Diabetes Daughter   No Known Problems Daughter   No Known Problems Grandchild  Social History Socioeconomic History  Marital status: Married   Spouse name: Not on file  Number of children: Not on file  Years of education: Not on file  Highest education level: Not on file Occupational History  Not on file Tobacco Use  Smoking status: Never  Smokeless tobacco: Never Vaping Use  Vaping Use: Never used Substance and Sexual Activity  Alcohol use: Yes   Alcohol/week: 2.0 standard drinks of alcohol   Types: 2 Cans of beer per week   Comment: social drinker once a week  Drug use: Never  Sexual activity: Not on file Other Topics Concern  Not on file Social History Narrative  Not on file Social Determinants of Health Financial Resource Strain: Low Risk  (08/05/2021)  Overall Financial Resource Strain (CARDIA)   Difficulty of Paying Living Expenses: Not very hard Food Insecurity: No Food Insecurity (09/18/2022)  Hunger Vital Sign   Worried About Running Out of Food in the Last Year: Never true   Ran Out of Food in the Last Year: Never true Transportation Needs: No Transportation Needs (09/18/2022)  PRAPARE - Designer, jewellery (Medical): No   Lack of Transportation (Non-Medical): No Physical Activity: Not on file Stress: Not on file Social Connections: Not on file Intimate Partner Violence: Not on file Housing Stability: Low Risk  (09/18/2022)  Housing Stability   Housing Stability: I have a steady place to live   Housing Stability: Not on file EXAM: General Appearance:He is heavy set, appears wellHEENT:  Mucous membranes moist, No oral lesions, no erythema, teeth are adequate repair Lungs: ClearCor: RRR, no m/r/gAbd: soft nontender, no hepatosplenomegaly, Extr: trace edema.  Skin: some follicular erythema noted on the chest and abdomen, which is mild. Lymph Nodes - no palpable adenopathy. Neurologic: mentation appears normal, normal speech, strength symmetric. LABSLab Results Component Value Date  WBC 3.5 (L) 12/08/2022  HGB 11.7 (L) 12/08/2022  HCT 33.20 (L) 12/08/2022  MCV 99.4 12/08/2022  PLT 162 12/08/2022 Comprehensive Metabolic Panel:Lab Results Component Value Date  GLU 109 (H) 12/08/2022  BUN 18 12/08/2022  CREATININE 1.23 12/08/2022  NA 139 12/08/2022  K 4.3 12/08/2022  CL 106 12/08/2022  CO2 21 12/08/2022  ALBUMIN 4.1 12/08/2022  PROT 6.6 12/08/2022  BILITOT 0.3 12/08/2022  ALKPHOS 83 12/08/2022  ALT 22 12/08/2022  GLOB 2.5 12/08/2022  CALCIUM 9.5 12/08/2022 Results in Past 7 DaysResult Component Current Result LD 212 (12/08/2022) ]A/P:  KRESTON AHRENDT is 72 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  His disease is characterized by minor cytopenias over a long period of observation with recently worsened anemia, thrombocytopenia, and a marrow with increased blasts consistent with disease progression. 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 Therapy was  started with decitabine and venetoclax, and transplantation  recommended given the now increased risk of shorten survival. The patient has had a formal  search of the registry and a 10/10 match was identified.A/P: Day 75  no evidence of gvhd. 1) Disease - Bone marrow biopsy September 10th, this was consistent with remission by morphology. Donor percentage was 100%, FISH and gene panel are normal.  Plan bone marrow biopsy around day 100 in the transplant clinic. 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 Goal is tac 5-10, check weekly ldh, haptoglobin, triglycerides. Will plan to taper tacrolimus in December. 3) Heme - Donor, Recipient both blood type A+ve. Blood counts looks good today. 4) ID - Prophylaxis with acyclovir, pentamidine, Cresemba. He is not at risk for CMV. 5) Hypertension - He is on continued Norvasc. 6) Liver: Continue Ursodiol, monitor mild LFT changes. Normal today. 7) History of skin cancer: Needs to be addressed after 3 month time point.8) Abnormal spine, lesion on MRI pre- transplant: Planned follow up MRI in 3-4 months. Dr. Chrystine Oiler MD Scribed for Chrystine Oiler, MD by Simona Huh, medical scribe October 22, 2024The documentation recorded by the scribe accurately reflects the services I personally performed and the decisions made by me. I reviewed and confirmed all material entered and/or pre-charted by the scribe.

## 2022-12-23 LAB — CYTOMEGALOVIRUS QUANTITATIVE PCR, PLASMA     (BH GH L LMW YH): BKR CMV QUANTITATIVE PCR: NOT DETECTED

## 2022-12-24 LAB — LYMPHOCYTE SUBSETS, TOTAL TBNK (GH LMW YH)
BK CD3-CD16/56+ ABS LYMPH COUNT: 347 /uL (ref 127–785)
BKR CD19+ ABS LYMPH COUNT: 119 /uL (ref 40–474)
BKR CD19+ LYMPH: 17.9 % (ref 3.17–26.01)
BKR CD3+ ABS LYMPH COUNT: 187 /uL — ABNORMAL LOW (ref 599–2401)
BKR CD3+ LYMPH: 27.96 % — ABNORMAL LOW (ref 52.81–84.29)
BKR CD3+/CD4+ LYMPH (EXCL.DUAL POS.): 16.8 % — ABNORMAL LOW (ref 26.95–64.80)
BKR CD3+/CD8+LYMPH (EXCL.DUAL POS.): 9.24 % (ref 7.52–43.41)
BKR CD3+CD4+ ABS LYMPH COUNT (EXCL.DUAL POS.): 112 /uL — ABNORMAL LOW (ref 363–1592)
BKR CD3+CD4+CD8+  LYMPH: 0.35 % (ref 0.23–5.31)
BKR CD3+CD4+CD8+ ABSOLUTE LYMPH COUNT: 2 /uL — ABNORMAL LOW (ref 3–126)
BKR CD3+CD4-CD8- ABSOLUTE LYMPH COUNT: 10 /uL (ref 8–113)
BKR CD3+CD4-CD8- LYMPH: 1.57 % (ref 0.42–5.95)
BKR CD3+CD8+ ABS LYMPH COUNT (EXCL.DUAL POS.): 62 /uL — ABNORMAL LOW (ref 99–1051)
BKR CD3-CD16/56+ LYMPH: 52.07 % — ABNORMAL HIGH (ref 5.56–33.68)
BKR CD4/CD8 LYMPH RATIO (EXCL.DUAL POS.): 1.82 (ref 0.62–7.79)
BKR CD45+ ABS LYMPH COUNT: 667 /uL — ABNORMAL LOW (ref 967–3023)
BKR TOTAL LYMPH EVENTS: 4006

## 2022-12-24 MED FILL — FOLIC ACID 1 MG TABLET: 1 mg | ORAL | 30 days supply | Qty: 30 | Fill #3 | Status: CP

## 2022-12-28 ENCOUNTER — Encounter: Admit: 2022-12-28 | Payer: PRIVATE HEALTH INSURANCE

## 2022-12-30 ENCOUNTER — Inpatient Hospital Stay: Admit: 2022-12-30 | Discharge: 2022-12-30 | Payer: MEDICARE

## 2022-12-30 ENCOUNTER — Ambulatory Visit: Admit: 2022-12-30 | Payer: MEDICARE

## 2022-12-30 ENCOUNTER — Ambulatory Visit: Admit: 2022-12-30 | Payer: MEDICARE | Attending: Pharmacist

## 2022-12-30 ENCOUNTER — Encounter: Admit: 2022-12-30 | Payer: PRIVATE HEALTH INSURANCE

## 2022-12-30 VITALS — BP 131/77 | HR 80 | Temp 97.40000°F | Resp 18 | Wt 199.3 lb

## 2022-12-30 DIAGNOSIS — Z79621 Long term (current) use of calcineurin inhibitor: Secondary | ICD-10-CM

## 2022-12-30 DIAGNOSIS — D84821 Immunodeficiency due to drugs: Secondary | ICD-10-CM

## 2022-12-30 DIAGNOSIS — C9200 Acute myeloblastic leukemia, not having achieved remission: Secondary | ICD-10-CM

## 2022-12-30 DIAGNOSIS — Z8249 Family history of ischemic heart disease and other diseases of the circulatory system: Secondary | ICD-10-CM

## 2022-12-30 DIAGNOSIS — Z9481 Bone marrow transplant status: Secondary | ICD-10-CM

## 2022-12-30 DIAGNOSIS — Z7969 Long term (current) use of other immunomodulators and immunosuppressants: Secondary | ICD-10-CM

## 2022-12-30 DIAGNOSIS — I10 Essential (primary) hypertension: Secondary | ICD-10-CM

## 2022-12-30 DIAGNOSIS — Z9484 Stem cells transplant status: Principal | ICD-10-CM

## 2022-12-30 DIAGNOSIS — Z79624 Long term (current) use of inhibitors of nucleotide synthesis: Secondary | ICD-10-CM

## 2022-12-30 DIAGNOSIS — I119 Hypertensive heart disease without heart failure: Secondary | ICD-10-CM

## 2022-12-30 DIAGNOSIS — E782 Mixed hyperlipidemia: Secondary | ICD-10-CM

## 2022-12-30 DIAGNOSIS — Z79899 Other long term (current) drug therapy: Secondary | ICD-10-CM

## 2022-12-30 DIAGNOSIS — C931 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: Secondary | ICD-10-CM

## 2022-12-30 DIAGNOSIS — F419 Anxiety disorder, unspecified: Secondary | ICD-10-CM

## 2022-12-30 DIAGNOSIS — R0609 Other forms of dyspnea: Secondary | ICD-10-CM

## 2022-12-30 DIAGNOSIS — C9110 Chronic lymphocytic leukemia of B-cell type not having achieved remission: Secondary | ICD-10-CM

## 2022-12-30 DIAGNOSIS — F32A Depression: Secondary | ICD-10-CM

## 2022-12-30 DIAGNOSIS — Z7901 Long term (current) use of anticoagulants: Secondary | ICD-10-CM

## 2022-12-30 DIAGNOSIS — N400 Enlarged prostate without lower urinary tract symptoms: Secondary | ICD-10-CM

## 2022-12-30 DIAGNOSIS — C4491 Basal cell carcinoma of skin, unspecified: Secondary | ICD-10-CM

## 2022-12-30 DIAGNOSIS — H409 Unspecified glaucoma: Secondary | ICD-10-CM

## 2022-12-30 LAB — PT/INR AND PTT (BH GH L LMW YH)
BKR INR: 1.05 (ref 0.86–1.13)
BKR PARTIAL THROMBOPLASTIN TIME: 24.8 s (ref 23.0–31.0)
BKR PROTHROMBIN TIME: 11.5 s (ref 9.6–12.3)

## 2022-12-30 LAB — COMPREHENSIVE METABOLIC PANEL
BKR A/G RATIO: 1.6 (ref 1.0–2.2)
BKR ALANINE AMINOTRANSFERASE (ALT): 20 U/L (ref 9–59)
BKR ALBUMIN: 4.1 g/dL (ref 3.6–5.1)
BKR ALKALINE PHOSPHATASE: 86 U/L (ref 9–122)
BKR ANION GAP: 10 (ref 7–17)
BKR ASPARTATE AMINOTRANSFERASE (AST): 19 U/L (ref 10–35)
BKR AST/ALT RATIO: 1
BKR BILIRUBIN TOTAL: 0.3 mg/dL (ref <=1.2)
BKR BLOOD UREA NITROGEN: 22 mg/dL (ref 8–23)
BKR BUN / CREAT RATIO: 19 (ref 8.0–23.0)
BKR CALCIUM: 9.1 mg/dL (ref 8.8–10.2)
BKR CHLORIDE: 107 mmol/L (ref 98–107)
BKR CO2: 22 mmol/L (ref 20–30)
BKR CREATININE: 1.16 mg/dL (ref 0.40–1.30)
BKR EGFR, CREATININE (CKD-EPI 2021): >60 mL/min/{1.73_m2} (ref >=60)
BKR GLOBULIN: 2.5 g/dL (ref 2.0–3.9)
BKR GLUCOSE: 102 mg/dL — ABNORMAL HIGH (ref 70–100)
BKR POTASSIUM: 4.4 mmol/L (ref 3.3–5.3)
BKR PROTEIN TOTAL: 6.6 g/dL (ref 5.9–8.3)
BKR SODIUM: 139 mmol/L (ref 136–144)

## 2022-12-30 LAB — CBC WITH AUTO DIFFERENTIAL
BKR WAM ABSOLUTE IMMATURE GRANULOCYTES.: 0 x 1000/ÂµL (ref 0.00–0.30)
BKR WAM ABSOLUTE LYMPHOCYTE COUNT.: 0.52 x 1000/ÂµL — ABNORMAL LOW (ref 0.60–3.70)
BKR WAM ABSOLUTE NRBC (2 DEC): 0 x 1000/ÂµL (ref 0.00–1.00)
BKR WAM ANC (ABSOLUTE NEUTROPHIL COUNT): 2.35 x 1000/ÂµL (ref 2.00–7.60)
BKR WAM BASOPHIL ABSOLUTE COUNT.: 0.03 x 1000/ÂµL (ref 0.00–1.00)
BKR WAM BASOPHILS: 0.9 % (ref 0.0–1.4)
BKR WAM EOSINOPHIL ABSOLUTE COUNT.: 0.05 x 1000/ÂµL (ref 0.00–1.00)
BKR WAM EOSINOPHILS: 1.4 % (ref 0.0–5.0)
BKR WAM HEMATOCRIT (2 DEC): 34 % — ABNORMAL LOW (ref 38.50–50.00)
BKR WAM HEMOGLOBIN: 11.8 g/dL — ABNORMAL LOW (ref 13.2–17.1)
BKR WAM IMMATURE GRANULOCYTES: 0 % (ref 0.0–1.0)
BKR WAM LYMPHOCYTES: 15.1 % — ABNORMAL LOW (ref 17.0–50.0)
BKR WAM MCH (PG): 34.8 pg — ABNORMAL HIGH (ref 27.0–33.0)
BKR WAM MCHC: 34.7 g/dL (ref 31.0–36.0)
BKR WAM MCV: 100.3 fL — ABNORMAL HIGH (ref 80.0–100.0)
BKR WAM MONOCYTE ABSOLUTE COUNT.: 0.5 x 1000/ÂµL (ref 0.00–1.00)
BKR WAM MONOCYTES: 14.5 % — ABNORMAL HIGH (ref 4.0–12.0)
BKR WAM MPV: 10.2 fL (ref 8.0–12.0)
BKR WAM NEUTROPHILS: 68.1 % (ref 39.0–72.0)
BKR WAM NUCLEATED RED BLOOD CELLS: 0 % (ref 0.0–1.0)
BKR WAM PLATELETS: 149 x1000/ÂµL — ABNORMAL LOW (ref 150–420)
BKR WAM RDW-CV: 14.7 % (ref 11.0–15.0)
BKR WAM RED BLOOD CELL COUNT.: 3.39 M/ÂµL — ABNORMAL LOW (ref 4.00–6.00)
BKR WAM WHITE BLOOD CELL COUNT: 3.5 x1000/ÂµL — ABNORMAL LOW (ref 4.0–11.0)

## 2022-12-30 LAB — RETICULOCYTES
BKR WAM IRF: 9.3 % (ref 3.0–15.9)
BKR WAM RETICULOCYTE - ABS (3 DEC): 0.029 10Ë6 cells/uL (ref 0.023–0.140)
BKR WAM RETICULOCYTE COUNT PCT (1 DEC): 0.9 % (ref 0.6–2.7)
BKR WAM RETICULOCYTE HGB EQUIVALENT: 37 pg — ABNORMAL HIGH (ref 28.2–35.7)

## 2022-12-30 LAB — TRIGLYCERIDES: BKR TRIGLYCERIDES: 185 mg/dL — ABNORMAL HIGH

## 2022-12-30 LAB — PHOSPHORUS     (BH GH L LMW YH): BKR PHOSPHORUS: 2.7 mg/dL (ref 2.2–4.5)

## 2022-12-30 LAB — TACROLIMUS LEVEL     (BH GH L LMW YH): BKR TACROLIMUS BLOOD: 7.6 ng/mL

## 2022-12-30 LAB — HAPTOGLOBIN: BKR HAPTOGLOBIN: 145 mg/dL (ref 30–200)

## 2022-12-30 LAB — LACTATE DEHYDROGENASE: BKR LACTATE DEHYDROGENASE: 190 U/L (ref 122–241)

## 2022-12-30 LAB — MAGNESIUM: BKR MAGNESIUM: 1.7 mg/dL (ref 1.7–2.4)

## 2022-12-30 NOTE — Progress Notes
 Day + 97 allo/aml. Brett Canales saw Alvis Lemmings APP today.VSS. See focused reassessment. He reports R eye floaters are a little worse. L eye unchanged. He is not able to schedule appt in [facility]- next available in June 2025. Maureen placed new request for Opth appt today. Lab values did not require interventions. Cresemba discontinued, he will have repeat MRI on 11/15. He returns to [facility] on 11/19, will have day 100bmbx in clinic then.

## 2022-12-30 NOTE — Progress Notes
 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 He reports feeling well. No fever or chills. He is eating and drinking, he reports his appetite and energy are improved. However he doesn't like the cold so has a difficult time going for walks. He is asking about his opthalmology needs and checking if he can go to optometrist instead.  We have clarified his needs and should receive an appointment soon.Review of Systems Constitutional: Negative.  Negative for chills, fever and weight loss. Eyes: Negative.       Note that when he gazes laterally with his right eye, he occasionally sees a halo or a burst of light. Not consistent. Respiratory: Negative.   Cardiovascular: Negative.  Negative for chest pain and palpitations. Gastrointestinal:  Negative for abdominal pain, constipation, diarrhea, nausea and vomiting. Musculoskeletal:  Negative for falls.      Right shoulder 85% better Skin: Negative.  Negative for itching and rash. Neurological:  Positive for tremors (Shakiness of hands.). Negative for dizziness and headaches. Psychiatric/Behavioral: Negative.   VoriconazoleCurrent Outpatient Medications on File Prior to Visit Medication Sig Dispense Refill  acyclovir (ZOVIRAX) 400 mg tablet Take 1 tablet (400 mg total) by mouth every 12 (twelve) hours. 180 tablet 3  amLODIPine (NORVASC) 10 mg tablet Take 1 tablet (10 mg total) by mouth daily.    finasteride (PROSCAR) 5 mg tablet TAKE 1 TABLET BY MOUTH DAILY FOR ENLARGED PROSTATE WITH URINATION PROBLEM TAKE WITH OR WITHOUT MEALS    folic acid (FOLVITE) 1 mg tablet Take 1 tablet (1 mg total) by mouth daily. 30 tablet 11  isavuconazonium (CRESEMBA) 186 mg capsule Take 2 capsules (372 mg total) by mouth daily. 56 capsule 11  latanoprost (XALATAN) 0.005 % ophthalmic solution Place 1 drop into both eyes nightly.    magnesium oxide 400 mg magnesium Cap Take 400 mg by mouth daily.    Miscellaneous Medical Supply Please provide rolling walker, Use as directed. 1 each 0  multivitamin tablet Take 1 tablet by mouth daily. 30 tablet 11  ondansetron (ZOFRAN) 8 mg tablet Take 1 tablet (8 mg total) by mouth every 8 (eight) hours as needed for nausea or vomiting. (Patient not taking: Reported on 12/01/2022) 30 tablet 2  pentamidine (PENTAM) 300 mg inhalation solution Inhale 6 mLs (300 mg total) into the lungs every 28 days. Last given 12/16/22    sodium chloride 0.9 % flush Use 10 mLs by IV Push route daily. In both lumens 600 mL 11  tacrolimus (PROGRAF) 5 mg Immediate Release capsule Take 1 capsule (5 mg total) by mouth 2 (two) times daily. (Patient taking differently: Take 1 capsule (5 mg total) by mouth 2 (two) times daily. 5 mg bid) 60 capsule 11  ursodioL (URSO FORTE) 500 mg tablet Take 1 tablet (500 mg total) by mouth 2 times daily (9am, 5pm). 60 tablet 6  [DISCONTINUED] venetoclax (VENCLEXTA) 100 mg tablet Take 4 tablets (400 mg total) by mouth once daily. Take with food. Swallow whole, do not crush. 120 tablet 0 Current Facility-Administered Medications on File Prior to Visit Medication Dose Route Frequency Provider Last Rate Last Admin  [COMPLETED] albuterol sulfate 90 mcg/actuation HFA aerosol inhaler 2 puff  2 puff Inhalation Once [name], APRN   2  puff at 12/16/22 1041  [COMPLETED] pentamidine 300 mg INHALATION solution  300 mg Nebulization Once [name], APRN   300 mg at 12/16/22 1043 BP: 134/80 (12/16/2022 10:27 AM), Pulse: 85 (12/16/2022 10:27 AM), Temp: 97.9 ?F (36.6 ?C) (12/16/2022 10:27 AM), Resp: 16 (12/16/2022 10:27 AM), SpO2: 100 % (12/16/2022 10:27 AM), Pain Score: Zero (12/16/2022 10:23 AM), Weight: 90.8 kg (12/16/2022 10:23 AM), Weight (lbs): 200.18 (12/16/2022 10:23 AM)Physical ExamConstitutional:     Appearance: Normal appearance. He is not ill-appearing. HENT:    Head: Normocephalic.    Nose: Nose normal. No congestion.    Mouth/Throat:    Mouth: Mucous membranes are moist.    Pharynx: Oropharynx is clear. Eyes:    General: No scleral icterus.      Right eye: No discharge.       Left eye: No discharge. Cardiovascular:    Rate and Rhythm: Normal rate and regular rhythm.    Pulses: Normal pulses.    Heart sounds: Normal heart sounds. No murmur heard.Pulmonary:    Effort: Pulmonary effort is normal. No respiratory distress.    Breath sounds: Normal breath sounds. No wheezing or rales. Abdominal:    General: Abdomen is flat. Bowel sounds are normal. There is no distension.    Palpations: Abdomen is soft.    Tenderness: There is no abdominal tenderness. There is no guarding. Musculoskeletal:       General: No swelling.    Right lower leg: No edema.    Left lower leg: No edema. Skin:   General: Skin is warm.    Findings: No erythema or rash. Neurological:    Mental Status: He is alert and oriented to person, place, and time. Psychiatric:       Behavior: Behavior normal. Lab Results Component Value Date/Time  WBC 3.1 (L) 12/16/2022 10:21 AM  HGB 12.1 (L) 12/16/2022 10:21 AM  HCT 33.90 (L) 12/16/2022 10:21 AM  PLT 168 12/16/2022 10:21 AM  Lab Results Component Value Date/Time  NEUTROPHILS 61.1 12/16/2022 10:21 AM  Lab Results Component Value Date/Time  NA 139 12/08/2022 09:22 AM  K 4.3 12/08/2022 09:22 AM  CL 106 12/08/2022 09:22 AM  CO2 21 12/08/2022 09:22 AM  BUN 18 12/08/2022 09:22 AM  CREATININE 1.23 12/08/2022 09:22 AM  GLU 109 (H) 12/08/2022 09:22 AM  ANIONGAP 12 12/08/2022 09:22 AM  Lab Results Component Value Date/Time  CALCIUM 9.5 12/08/2022 09:22 AM  MG 1.5 (L) 12/08/2022 09:22 AM  PHOS 2.6 12/08/2022 09:22 AM  Lab Results Component Value Date/Time  ALT 22 12/08/2022 09:22 AM  AST 21 12/08/2022 09:22 AM  ALKPHOS 83 12/08/2022 09:22 AM  BILITOT 0.3 12/08/2022 09:22 AM  BILIDIR 0.2 09/11/2022 12:31 PM  ALBUMIN 4.1 12/08/2022 09:22 AM  Lab Results Component Value Date/Time  PTT 23.9 12/16/2022 10:21 AM  LABPROT 11.8 12/16/2022 10:21 AM  INR 1.08 12/16/2022 10:21 AM  No results found for requested labs within last 7 days. No results found for: RAPAMYCINNo results found for requested labs within last 7 days. Assessment:Douglas [name] is a 72 y.o. 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 He is s/p 2 cycles of chemotherapy with Decitabine + Venetoclax with reduction in blast count. 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 He is engrafted on day +13 and is discharged on 10/10/22. Plan: Day +97 No s/sx of GVHD. Continues to have right  eye floaters and dryness to both eyes, feels like he is looking through his eyelashes. Referral follow up to Ophthalmology, previously misunderstanding of patiens needs BA has clarified and expecting appt soon. Follow up MRI spine. 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 Needs BM Bx ~ 100 planned for 11/19 (clinic performed),180 and 365Heme: No transfusions needed.Transfuse to maintain Hgb > 7 and plts >/=20 or if clinically indicatedInfectious Disease: Prophylactic Antimicrobials of Acyclovir, Cresemba(stopped 11/14 no fungal infections H/O prolonged neutropenia prior to transplant) and  Pentam Inhalation given last on 12/16/22 and will be repeated every 3-4 weeks Identified as CMV Risk: check periodically - not detected on 10/22/24GVHD Prophylaxis: No s/sxPost Transplant Cy 25mg /kg IV daily on day 3 and 4 (dose reduced by 50% ) Tacrolimus 5.0mg  BID.  Therapeutic goal of 6-11ng/ml.  Level 7.6 ng/ml. On 11/13 stopped Cresemba increased tac to 5.5mg  BID  Continue current dosing.No MMF with 10/10Monitor Triglycerides, Haptoglobin and LDH weekly on Tacrolimus (183/132/hemolyzed)CV: HTN: Norvasc to 10 mg po dailyFluids, Electrolytes and Nutrition:Replete electrolytes PRNDiet - safe food handlingDaily MTV, Folic Acid and MagnesiumMSK: Right Shoulder 85%  back to his normal Right shoulder pain, full ROM with pain. Larey Seat on 11/08/22). Will continue to ice and take tylenol. Also recommended voltaren topical cream. Follow up pre transplant MRI lumbar spine scheduled for 11/14\CVAD: [facility] for chemotherapy and supportive carePost Transplant immune reconstitution/vaccines: Date   Day   T cell subset   IGG   ~100     ~180     ~1 year   Plan on re-vaccinating at 1 year unless remain on immunosuppression. Vaccines : 12, 14 , 16 and 24 months s/p transplantHealth Maintenance Day +30 Day +100 Day +180 1 Year Heme/Immune       T cell subsets/IGG       Chimerism/Bone Marrow or PET/Imaging [x]  [] []  [] []  [] []  Pulmonary       PFT's  []  []  []  Cardiology       Echo       EKG   []  Only if cardiac risk factors  [] []  [] []  Endocrine       TSH/TFT's       Fasting Lipids       Glucose/A1c       Vit D/Calcium        Bone Density (women and those on steroids)Reproductive Endocrine        FSH/LH/Testosterone (both men & women)   []  [] [] [] [] [] [] []  Liver      Ferritin      MRI T2* quant (if ferritin abnormal) [] []  Ophthalmology       Sicca/Cataracts   []  []  Dermatology/musculo-skeletal        GVHD (skin & ROM)        Cancer screening (Derm referral)            [] []  [] []  Dental       GVHD       Routine Care   []  []              Majhail, et al. (2012). Recommended Screening and Preventive Practices for Long-Term Survivors after                    Hematopoietic Cell Transplantation. Biology of Blood and Marrow Transplantation, Volume 18,  Issue 3, March 2012, Pages 348-371.Conditioning/Transplant:-Melphalan 70 mg/m2 over 30 minutes on D-6, *Melphalan dosed reduced to 70 mg/m2 due to age-Fludarabine 40 mg/m2 over 30 minutes every 24hr for 4 doses from D-5 to D -2             -Stem cell infusion day 0 (09/24/22)Follow up 11/19 for labs and [employer]. APRNOffice #[phone]

## 2022-12-30 NOTE — Other
 Post-HSCT Follow-Up Pharmacist note Diagnosis: CMMLConditioning:   Fludarabine/Melphalan with post-transplant Cyclophosphamide Transplant Type:10/10 MUD Allogeneic PBSCTTransplant Date (Day 0): 09/24/22 Referring Provider: Chrystine Oiler, MDEncounter Date: 12/30/2022 = D+97[name] is a 72 y.o. year-old male with history of CMML s/p HSCT. Patient is being seen by the clinic pharmacist for medication management included post stem cell transplant, hematological related disorders and GI related disorders .  This visit was completed as a joint-visit with provider present and is aware of the care plan.Subjective: Having increase protein floaters in his left eye. In the process of setting up appointment with ophthalmologist.Reports having tremors in both hands. Held tacrolimus prior to morning labs - took AM dose in clinic Assessment and Recommendations:  Summary of Medication Changes and Follow-up Items Discussed with Provider:Tacrolimus 7.6 will increase to 5.5 mg BID since discontinuing cresemba todayGVHD (immunosuppression)Tacrolimus: Current dose: 5 mg PO twice daily11/13/24  level: 7.6, increase to 5.5 mg BIDGoal range: 6-11 ng/mLDosage dispensed: 0.5mg , 1mg , 5mg  capsulesPharmacy dispensed: OPSPost transplant cyclophosphamide: completed inpatient *All immunosuppression is managed by the bone marrow transplant team.Infectious Disease (prophylaxis, monitoring, and active infections)ViralCurrent prophylaxis/treatment: Acyclovir 400mg  PO twice dailyPrevious viral infections: 	HSV/VZV +/?CMV -/?, 10/30 CMV negativeEBV IgG negativeBK virus N/APneumocystis jeuvecii pneumoniaCurrent prophylaxis/treatment: Pentamidine 300mg  inhaled monthly last given 10/30/24Previous PJP infection: N/AT-cell subsetDay +100 (12/22/22): pendingDay +180 (date):1 year (date):Toxoplasmosis IgG negativeCurrent prophylaxis/treatment: NoneFungal/MoldCurrent prophylaxis/treatment: Isavuconazole 372mg  PO daily - history of prolonged cytopenias, d/c 11/13/24Previous fungal/mold infection: N/A, Hx of gingival changes to voriconazole Drug monitoring: N/AGalactomann - 10/01/22 negFungitell - 10/01/22 < 31Bacterial	Current prophylaxis/treatment: None	Previous bacterial infection: N/A	Drug monitoring: N/A	Blood cultures: N/ASupportive careHyperlipidemia monitoring (triglycerides, Cholesterol, LDL, HDL)Current treatment: None	Prior treatment: Atorvastatin 40mg  PO daily, held with transplantBaseline (09/18/2022): 68 Today (12/30/22): 185Hypertension monitoring (blood pressure)Current treatment: Amlodipine 10mg  PO dailyPrior treatment: Metoprolol and lisinopril on hold since transplant Today (12/30/22):  131/77GastrointestionalStress ulcer prophylaxis: None, one episode of acid refluxNausea/Vomiting: Intermittent nausea, takes ondansetron with good effect. Doesn't find that prochlorperazine has much benefit.Diarrhea/constipation: Continues to have soft stools, loperamidine prnOral care and mucositis: NoneOther: Discussed using plastic utensils for meals at home given ongoing metallic taste in the mouth that affects appetite. Veno-occlusive diseaseCurrent treatment:  Ursodiol 500mg  PO twice daily until day +90, d/c 11/5/24Anticoagulation	Current prophylaxis/treatment: None	Previous anticoagulation history: Aspirin 81mg  PO daily, for cardiac ppx - not restartingElectrolyte supplementation:	Current treatment: Magnesium oxide 400mg  BID	Monitoring: Mg 1.7 11/13/24Hypogammaglobulinemia	Current treatment:	Goal: 500-800 ng/dLIVIG last dose:Baseline IgG level:Last IgG level:IgG scheduled levelsDay +100 (12/22/22): 856Day +180 (date):	1 year (date):	Maintenance Therapy Plan: VaccinationsBaseline titers: 	Hepatitis B: 	Pneumococcal: 	VZV 	Measles: , Mumps: , Rubella: 12 month: 14 month: 18 month:	Hep B titer	Pneumococcal titers24 month:Titers drawn 2 months after 24 months:Redose needed:Medication History:Douglas [name] presents to today's visit with their med action plan.  A medication history was obtained from the patient and spouse.Adherence:The patient reports 0 missed doses of medications in the past 1 week(s). The patient does not report the barriers to adherence. Discussed potential outcomes associated with non-adherenceAffordability:The patient denies concerns with affordability of medications at this time. Medication Management:Plan for medication management assistance: An updated medication action plan was not given to the patient, no changesPatient would benefit from continued pharmacy follow-up at future clinic visits for medication managementEducated patient on the above changesPatient did not request any refills todayObjective: MVH:QIONGE Results (from the past week) CBC auto differential  Collection Time: 12/30/22 11:02 AM Result Value Ref Range  WBC 3.5 (L) 4.0 - 11.0 x1000/?L  RBC 3.39 (L) 4.00 - 6.00 M/?L  Hemoglobin 11.8 (L) 13.2 - 17.1 g/dL  Hematocrit 95.28 (  L) 38.50 - 50.00 %  MCV 100.3 (H) 80.0 - 100.0 fL  MCH 34.8 (H) 27.0 - 33.0 pg  MCHC 34.7 31.0 - 36.0 g/dL  RDW-CV 54.0 98.1 - 19.1 %  Platelets 149 (L) 150 - 420 x1000/?L  MPV 10.2 8.0 - 12.0 fL  Neutrophils 68.1 39.0 - 72.0 %  Lymphocytes 15.1 (L) 17.0 - 50.0 %  Monocytes 14.5 (H) 4.0 - 12.0 %  Eosinophils 1.4 0.0 - 5.0 %  Basophil 0.9 0.0 - 1.4 %  Immature Granulocytes 0.0 0.0 - 1.0 %  nRBC 0.0 0.0 - 1.0 %  Absolute Lymphocyte Count 0.52 (L) 0.60 - 3.70 x 1000/?L  Monocyte Absolute Count 0.50 0.00 - 1.00 x 1000/?L  Eosinophil Absolute Count 0.05 0.00 - 1.00 x 1000/?L  Basophil Absolute Count 0.03 0.00 - 1.00 x 1000/?L  Absolute Immature Granulocyte Count 0.00 0.00 - 0.30 x 1000/?L Absolute nRBC 0.00 0.00 - 1.00 x 1000/?L  ANC (Abs Neutrophil Count) 2.35 2.00 - 7.60 x 1000/?L CMP:No results found for this or any previous visit (from the past week).LDH: No results found for this or any previous visit (from the past week).Patient Education: The following education was provided for: Initiation, modification, or discontinuation of drug therapy, Assessment of efficacy and safety of drug therapy, and Adverse effect presentation, evaluation, and monitoring during today?s visit to the patient and spouse regarding medication management: purpose of each medication, side effects of medications, and importance of adherence with the regimen(s) Time Spent: 15 minutes, face-to-face consultPATIENT AND/OR CAREGIVER VERBALIZED UNDERSTANDING OF CARE PLAN: yesPATIENT ADVISED TO CALL BACK WITH QUESTIONS, CONCERNS, OR CHANGE IN SYMPTOMS.Louanne Belton, PharmD, BCOP11/13/24

## 2022-12-31 ENCOUNTER — Telehealth: Admit: 2022-12-31 | Payer: PRIVATE HEALTH INSURANCE | Attending: Medical Oncology

## 2022-12-31 ENCOUNTER — Inpatient Hospital Stay: Admit: 2022-12-31 | Discharge: 2022-12-31 | Payer: MEDICARE

## 2022-12-31 DIAGNOSIS — Z9484 Stem cells transplant status: Secondary | ICD-10-CM

## 2022-12-31 LAB — CYTOMEGALOVIRUS QUANTITATIVE PCR, PLASMA     (BH GH L LMW YH): BKR CMV QUANTITATIVE PCR: NOT DETECTED

## 2022-12-31 MED ORDER — GADOTERATE MEGLUMINE 0.5 MMOL/ML (376.9 MG/ML) INTRAVENOUS SOLUTION
0.5 | Freq: Once | INTRAVENOUS | Status: CP | PRN
Start: 2022-12-31 — End: ?
  Administered 2022-12-31: 20:00:00 0.5 mL via INTRAVENOUS

## 2022-12-31 NOTE — Telephone Encounter
 Patient RTC.  Called patient back.  Patient stated he would like a call back from Social Work

## 2022-12-31 NOTE — Telephone Encounter
 Automated Call Center Message ReceivedPt of [name]

## 2023-01-01 MED FILL — TACROLIMUS IMMEDIATE RELEASE 5 MG CAPSULE: 5 mg | ORAL | 30 days supply | Qty: 60 | Fill #3 | Status: CP

## 2023-01-04 ENCOUNTER — Encounter: Admit: 2023-01-04 | Payer: PRIVATE HEALTH INSURANCE

## 2023-01-04 NOTE — Progress Notes
 SOCIAL WORK NOTEPatient Name: Douglas Traxler BennettMedical Record Number: [id number] Date of Birth: 1952/05/07Medical Social Work Follow Up  [employer] Most Recent Value Admission Information  Document Type Progress Note Reason for Current Social Work Regulatory affairs officer of Information Patient Medical Team Comment [place] [place] Record Reviewed Yes Level of Care Ambulatory What medium(s) of communication were used with patient/family/caregiver? Telephone Psychosocial issues requiring intervention Suites Psychosocial interventions 5 minutes. Called Mr. Doskocil after receiving a call from [place] at the [facility] who asked for a cross charge form. Advised him I think there might be a solution to the $200 for the 9/6- 9/11 stay and I will keep him updated. [facility] [place], [facility]--faxed her the cross charge form Specific referrals to enhance community supports (include existing and new resources) above [employer] Required? No Next Steps/Plan (including hand-off): Child psychotherapist awaiting response from the [facility] [place]. Signature: Huntley Estelle, LCSW Contact Information: [phone]

## 2023-01-05 ENCOUNTER — Inpatient Hospital Stay: Admit: 2023-01-05 | Discharge: 2023-01-05 | Payer: MEDICARE

## 2023-01-05 ENCOUNTER — Encounter: Admit: 2023-01-05 | Payer: PRIVATE HEALTH INSURANCE

## 2023-01-05 ENCOUNTER — Ambulatory Visit: Admit: 2023-01-05 | Payer: PRIVATE HEALTH INSURANCE

## 2023-01-05 ENCOUNTER — Ambulatory Visit: Admit: 2023-01-05 | Payer: MEDICARE | Attending: Medical Oncology

## 2023-01-05 VITALS — BP 130/78 | HR 77 | Temp 97.00000°F | Resp 18 | Wt 197.1 lb

## 2023-01-05 DIAGNOSIS — Z9484 Stem cells transplant status: Secondary | ICD-10-CM

## 2023-01-05 DIAGNOSIS — C9200 Acute myeloblastic leukemia, not having achieved remission: Secondary | ICD-10-CM

## 2023-01-05 DIAGNOSIS — C9201 Acute myeloblastic leukemia, in remission: Principal | ICD-10-CM

## 2023-01-05 LAB — CBC WITH AUTO DIFFERENTIAL
BKR WAM ABSOLUTE IMMATURE GRANULOCYTES.: 0.03 x 1000/ÂµL (ref 0.00–0.30)
BKR WAM ABSOLUTE LYMPHOCYTE COUNT.: 0.73 x 1000/ÂµL (ref 0.60–3.70)
BKR WAM ABSOLUTE NRBC (2 DEC): 0 x 1000/ÂµL (ref 0.00–1.00)
BKR WAM ANC (ABSOLUTE NEUTROPHIL COUNT): 2.58 x 1000/ÂµL (ref 2.00–7.60)
BKR WAM BASOPHIL ABSOLUTE COUNT.: 0.02 x 1000/ÂµL (ref 0.00–1.00)
BKR WAM BASOPHILS: 0.5 % (ref 0.0–1.4)
BKR WAM EOSINOPHIL ABSOLUTE COUNT.: 0.03 x 1000/ÂµL (ref 0.00–1.00)
BKR WAM EOSINOPHILS: 0.8 % (ref 0.0–5.0)
BKR WAM HEMATOCRIT (2 DEC): 35.4 % — ABNORMAL LOW (ref 38.50–50.00)
BKR WAM HEMOGLOBIN: 12.6 g/dL — ABNORMAL LOW (ref 13.2–17.1)
BKR WAM IMMATURE GRANULOCYTES: 0.8 % (ref 0.0–1.0)
BKR WAM LYMPHOCYTES: 18.8 % (ref 17.0–50.0)
BKR WAM MCH (PG): 35.6 pg — ABNORMAL HIGH (ref 27.0–33.0)
BKR WAM MCHC: 35.6 g/dL (ref 31.0–36.0)
BKR WAM MCV: 100 fL (ref 80.0–100.0)
BKR WAM MONOCYTE ABSOLUTE COUNT.: 0.49 x 1000/ÂµL (ref 0.00–1.00)
BKR WAM MONOCYTES: 12.6 % — ABNORMAL HIGH (ref 4.0–12.0)
BKR WAM MPV: 10.3 fL (ref 8.0–12.0)
BKR WAM NEUTROPHILS: 66.5 % (ref 39.0–72.0)
BKR WAM NUCLEATED RED BLOOD CELLS: 0 % (ref 0.0–1.0)
BKR WAM PLATELETS: 192 x1000/ÂµL (ref 150–420)
BKR WAM RDW-CV: 14.1 % (ref 11.0–15.0)
BKR WAM RED BLOOD CELL COUNT.: 3.54 M/ÂµL — ABNORMAL LOW (ref 4.00–6.00)
BKR WAM WHITE BLOOD CELL COUNT: 3.9 x1000/ÂµL — ABNORMAL LOW (ref 4.0–11.0)

## 2023-01-05 LAB — COMPREHENSIVE METABOLIC PANEL
BKR A/G RATIO: 1.6 (ref 1.0–2.2)
BKR ALANINE AMINOTRANSFERASE (ALT): 24 U/L (ref 9–59)
BKR ALBUMIN: 4.3 g/dL (ref 3.6–5.1)
BKR ALKALINE PHOSPHATASE: 96 U/L (ref 9–122)
BKR ANION GAP: 12 (ref 7–17)
BKR ASPARTATE AMINOTRANSFERASE (AST): 23 U/L (ref 10–35)
BKR AST/ALT RATIO: 1
BKR BILIRUBIN TOTAL: 0.3 mg/dL (ref <=1.2)
BKR BLOOD UREA NITROGEN: 22 mg/dL (ref 8–23)
BKR BUN / CREAT RATIO: 15.4 (ref 8.0–23.0)
BKR CALCIUM: 9.3 mg/dL (ref 8.8–10.2)
BKR CHLORIDE: 106 mmol/L (ref 98–107)
BKR CO2: 21 mmol/L (ref 20–30)
BKR CREATININE: 1.43 mg/dL — ABNORMAL HIGH (ref 0.40–1.30)
BKR EGFR, CREATININE (CKD-EPI 2021): 53 mL/min/{1.73_m2} — ABNORMAL LOW (ref >=60)
BKR GLOBULIN: 2.7 g/dL (ref 2.0–3.9)
BKR GLUCOSE: 133 mg/dL — ABNORMAL HIGH (ref 70–100)
BKR POTASSIUM: 4.2 mmol/L (ref 3.3–5.3)
BKR PROTEIN TOTAL: 7 g/dL (ref 5.9–8.3)
BKR SODIUM: 139 mmol/L (ref 136–144)

## 2023-01-05 LAB — RETICULOCYTES
BKR WAM IRF: 6.7 % (ref 3.0–15.9)
BKR WAM RETICULOCYTE - ABS (3 DEC): 0.028 10Ë6 cells/uL (ref 0.023–0.140)
BKR WAM RETICULOCYTE COUNT PCT (1 DEC): 0.8 % (ref 0.6–2.7)
BKR WAM RETICULOCYTE HGB EQUIVALENT: 39.7 pg — ABNORMAL HIGH (ref 28.2–35.7)

## 2023-01-05 LAB — PT/INR AND PTT (BH GH L LMW YH)
BKR INR: 1.03 (ref 0.86–1.13)
BKR PARTIAL THROMBOPLASTIN TIME: 24.1 s (ref 23.0–31.0)
BKR PROTHROMBIN TIME: 11.3 s (ref 9.6–12.3)

## 2023-01-05 LAB — HAPTOGLOBIN: BKR HAPTOGLOBIN: 184 mg/dL (ref 30–200)

## 2023-01-05 LAB — TACROLIMUS LEVEL     (BH GH L LMW YH): BKR TACROLIMUS BLOOD: 8.1 ng/mL

## 2023-01-05 LAB — TRIGLYCERIDES: BKR TRIGLYCERIDES: 231 mg/dL — ABNORMAL HIGH

## 2023-01-05 LAB — MAGNESIUM: BKR MAGNESIUM: 1.6 mg/dL — ABNORMAL LOW (ref 1.7–2.4)

## 2023-01-05 LAB — LACTATE DEHYDROGENASE: BKR LACTATE DEHYDROGENASE: 256 U/L — ABNORMAL HIGH (ref 122–241)

## 2023-01-05 LAB — PHOSPHORUS     (BH GH L LMW YH): BKR PHOSPHORUS: 2.5 mg/dL (ref 2.2–4.5)

## 2023-01-05 NOTE — Progress Notes
 Oncology Nutrition AssessmentLocation [facility]Type of visit: follow upDiagnosis/Treatment: AML s/p SCTPast Medical History: reviewed available dataNutritionally relevant medications/supplements: tacrolimus, zofran, multivitamin, magnesium, folic acidNutritionally relevant labs: reviewed   BMI: Estimated body mass index is 27.49 kg/m? as calculated from the following:  Height as of 09/09/22: 5' 11 (1.803 m).  Weight as of an earlier encounter on 01/05/23: 89.4 kg.   IBW: 86 kg ([place] + 10%)Weight changes and assessment:Wt: 01/05/23 89.4 kg 12/30/22 90.4 kg 12/22/22 90.5 kg 12/16/22 90.8 kg 12/08/22 92.8 kg 12/01/22 92.4 kg Weight trending downward, not considered significant for timeframe.Nutrition Focused Physical Findings: Mild depletion on visual assessment (orbital, buccal temple) Nutritional Needs: 2580 calories (30kcal/kg), 103-129 gm pro (1.2-1.5g protein/kg) based on AML with treatment, IBW   Diet history or considerationsFood Allergies/intolerances or aversions: NKFACultural/lifestyle/family needs: none notedSupport system: wife present during visitActivity level and/or physical limitations: he has been trying to be more active, taking his dog to the parkNutrition assessment:Chart reviewed for follow up assessment. Met with patient in the day hospital. He reports altered/metallic taste which has made it more difficult to eat. His portions are smaller than baseline but improving; he has been making an effort to eat despite the altered taste. He dislikes the shake samples provided previously and other shakes he has tried in the past, but continues to try selecting high calorie, high protein foods. PO intake is inadequate but improving.  Nutrition Diagnosis: Malnutrition related to altered taste and nausea as evidenced by PO intake less than 75% for more than one month and significant weight loss of more than 5% in one month. - weight more stable, change to inadequate oral intake related to side effects of treatment as evidenced by patient report Nutrition Intervention: General/healthful diet   and Specific foods/beverages or groups Encouraged small, frequent meals and snacksDiscussed strategies for altered tasteGoals by next nutrition visit:Consume 5-6 small meals and snacks daily - met, continueEducation Materials provided: noneNutrition monitoring and evaluation: Oral fluids , Amount of food, Weight , and Digestive system  Coordination with providers: RN aware of visitLearning assessment barriers: none Comprehension level: good based on discussion and questionsExpected compliance/motivation: good Follow up: as neededLength of time spent with pt: 20 minutesSeen UE:AVWUJW Mickaela Starlin, RD, CDN, MSElectronically Signed by Magda Paganini, RD, January 05, 2023

## 2023-01-05 NOTE — Progress Notes
 Day +103 post MUD for his AML.Reports fair energy and fair appetite.No fever/chills; no nausea/vomiting/diarrhea.Skin intact; no rash/edema noted.No SOB; no cough nor nasal congestion.RDLH dressing and claves changed.Labs drawn-no hydration/repletion needed.Seen by Peterson Ao and tolerated bonemarrow biopsy done by same APP after timeout at bedside.Vital signs remained stable after the procedure.No complaints of discomfort made and dressing to lower back intact.Discharged with his wife.Ok per [employer] to come back in about ten days on Friday.

## 2023-01-05 NOTE — Progress Notes
 Bone Marrow Aspiration/Biopsy Procedure NoteINFORMED CONSENT & TIME OUTThe patient was identified by the following methods (need 2):Name/DOBVerbal with patient and/or familyPotential risks including bleeding, infection and pain were reviewed with the patient.The following procedure verification steps were taken: Procedure confirmed with patient or family/designeeConsent for procedure signedRelevant documentation completed, reviewed, and signedClinical indications for procedure confirmed Site Marked: YesTime-out was taken with all members of procedure team immediately prior to procedure and the following were confirmed: Correct patient identifiedAgreement on procedureCorrect side and siteCorrect patient positionAvailability of correct implant/equipmentPROCEDURE PERFORMED:  Bone Marrow Aspirate & BiopsySITE PERFORMED:  right posterior iliac crest, prepped with Chlorhexidine  using sterile technique.LOCAL ANESTHESIA: 1% plain lidocaine subcutaneous injection, 5 ml administered.ORDERING PHYSICIAN:                            INDICATIONS:  Acute Myeloid Leukemia  (AML)PATIENT POSITION: Patient was placed in the prone position.ESTIMATED BLOOD LOSS:  NonePOST PROCEDURE PAIN LEVEL:  mild discomfortPATIENT CONDITION POST PROCEDURE: stableCOMPLICATIONS: Patient tolerated the procedure well and no complications were noted.SPECIMENS: routine histopathologic stains and sectioning, flow cytometry, and cytogenetics PATIENT INSTRUCTIONS: Resume normal activity unless significant discomfort or otherwise instructed.Maintain pressure dressing dry and intact for 24 hours.Dressing may be removed after 24 hours and replace with a Band-Aid if needed.May shower after 24 hours.For the first 48 hours the site should not be submerged in water.Ice packs to site(s) as needed for discomfort.Acetaminophen 1-2 tabs every 6 hours as needed for discomfort.Minda Ditto, APRNPlease contact via MHB11/19/24

## 2023-01-05 NOTE — Progress Notes
 SOCIAL WORK NOTEPatient Name: Douglas Schamp BennettMedical Record Number: [id number] Date of Birth: Mar 23, 1952Medical Social Work Follow Up  [employer] Most Recent Value Admission Information  Document Type Progress Note Reason for Current Social Work Involvement Resources Source of Information Patient Record Reviewed Yes Level of Care Ambulatory What medium(s) of communication were used with patient/family/caregiver? MyChart Message Psychosocial issues requiring intervention Suites Psychosocial interventions My Chart Collaborations Sent My Chart with copy of the refund for 9/6- 9/11 that I received from [place] at the [place]. Asked Mr, Dodgen to confirm receipt. Specific referrals to enhance community supports (include existing and new resources) above Handoff Required? No Next Steps/Plan (including hand-off): Social work intervention complete. Signature: Huntley Estelle, LCSW Contact Information: [phone]

## 2023-01-06 LAB — MDS PLUS PANEL
BKR CD10+CD33+: 1.1 %
BKR CD10+CD34+: 4.8 %
BKR CD11B+CD16+: 46.4 %
BKR CD13+CD11B+: 88.9 %
BKR CD14+CD33+: 83.3 %
BKR CD14-CD64+: 15.1 %
BKR CD19+: 38 %
BKR CD19+CD38+: 28.5 %
BKR CD19+KAPPA+ MONO GATE: 0 %
BKR CD19+KAPPA+ MYELOID GATE: 0.3 %
BKR CD19+KAPPA+: 18.3 %
BKR CD19+LAMBDA+ MONO GATE: 0 %
BKR CD19+LAMBDA+ MYELOID GATE: 0.4 %
BKR CD19+LAMBDA+: 8.3 %
BKR CD3+CD16/56+: 0.1 %
BKR CD3+CD16/56-: 6.6 %
BKR CD3+CD4+ MONO GATE: 0.5 %
BKR CD3+CD4+: 3.4 %
BKR CD3+CD4+CD8+: 0.1 %
BKR CD3+CD5+ MONO GATE: 0.5 %
BKR CD3+CD5+ MYELOID GATE: 0.3 %
BKR CD3+CD5+: 6 %
BKR CD3+CD8+ MONO GATE: 0.7 %
BKR CD3+CD8+: 2.3 %
BKR CD3-CD16/56+: 7.6 %
BKR CD3-CD4+: 50.9 %
BKR CD33+ MONO GATE: 91.3 %
BKR CD33+: 9.8 %
BKR CD33+CD10+: 0.1 %
BKR CD33+CD34+: 1.5 %
BKR CD33+HLADR-: 2.4 %
BKR CD34+: 8.7 %
BKR CD34+CD10+: 0.3 %
BKR CD34+CD117+ MONO GATE: 0 %
BKR CD34+CD117+: 0.4 %
BKR CD34+CD33+ MONO GATE: 1.3 %
BKR CD34+CD64+ MONO GATE: 0.3 %
BKR CD34+CD64+: 0.3 %
BKR CD34+HLADR+: 0.9 %
BKR CD4/CD8 RATIO: 1.48
BKR CD45+CD10+CD33-: 27.3 %
BKR CD45+CD10-CD33+: 43.5 %
BKR CD45+CD11B+CD13+: 34.2 %
BKR CD45+CD14+CD64+: 1.4 %
BKR CD45+CD16+CD11B+: 34.8 %
BKR CD45+CD16.56+CD3-: 0.1 %
BKR CD45+CD33+HLADR+: 5.5 %
BKR CD45+CD34+CD10+: 0.4 %
BKR CD45+CD34+CD117+: 0.3 %
BKR CD45+CD34+CD33+: 0.5 %
BKR CD45+CD34+CD7+: 0.2 %
BKR CD45+CD56+CD38-: 0.5 %
BKR CD56+: 0 %
BKR CD56+CD38+: 3.4 %
BKR CD64+: 90.7 %
BKR CD64+CD14+ MONO GATE: 75.9 %
BKR CD64+CD14+: 1.7 %
BKR CD7+CD117+: 0.4 %
BKR CD7+CD117- MONO GATE: 1.9 %
BKR CD7+CD117-: 17.8 %
BKR HLADR+CD33+: 65.7 %

## 2023-01-06 LAB — CYTOMEGALOVIRUS QUANTITATIVE PCR, PLASMA     (BH GH L LMW YH): BKR CMV QUANTITATIVE PCR: NOT DETECTED

## 2023-01-06 LAB — FLOW CYTOMETRY - LEUKEMIA/LYMPHOMA/NEOPLASIA, BONE MARROW

## 2023-01-06 LAB — BONE MARROW SMEAR, ASPIRATION, AND STAIN      (L YH)

## 2023-01-06 MED FILL — MULTIVITAMIN TABLET: ORAL | 90 days supply | Qty: 90 | Fill #1 | Status: CP

## 2023-01-07 ENCOUNTER — Telehealth: Admit: 2023-01-07 | Payer: PRIVATE HEALTH INSURANCE | Attending: Medical Oncology

## 2023-01-07 ENCOUNTER — Encounter: Admit: 2023-01-07 | Payer: MEDICARE

## 2023-01-07 DIAGNOSIS — C931 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: Secondary | ICD-10-CM

## 2023-01-07 NOTE — Telephone Encounter
 Patient stated that he had his transplant procedure back in August and would like to know if it's okay for him to have the Flu shot?

## 2023-01-07 NOTE — Telephone Encounter
 RC made back to Douglas [name] letting him know I spoke to [name] and he would like to see him at his next appt on 11/29 and will discuss vaccines going forward.  He verbalized understanding and appreciated the call back.

## 2023-01-08 ENCOUNTER — Ambulatory Visit: Admit: 2023-01-08 | Payer: MEDICARE | Attending: Medical Oncology

## 2023-01-08 ENCOUNTER — Ambulatory Visit: Admit: 2023-01-08 | Payer: MEDICARE

## 2023-01-09 LAB — ENGRAFTMENT/CHIMERISM POST, BONE MARROW     (YH): BKR % DONOR CELLS: 100 %

## 2023-01-12 ENCOUNTER — Encounter: Admit: 2023-01-12 | Payer: PRIVATE HEALTH INSURANCE | Attending: Ophthalmology

## 2023-01-12 ENCOUNTER — Encounter: Admit: 2023-01-12 | Payer: MEDICARE | Attending: Ophthalmology

## 2023-01-12 DIAGNOSIS — R0609 Other forms of dyspnea: Secondary | ICD-10-CM

## 2023-01-12 DIAGNOSIS — C9110 Chronic lymphocytic leukemia of B-cell type not having achieved remission: Secondary | ICD-10-CM

## 2023-01-12 DIAGNOSIS — C4491 Basal cell carcinoma of skin, unspecified: Secondary | ICD-10-CM

## 2023-01-12 DIAGNOSIS — C931 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: Secondary | ICD-10-CM

## 2023-01-12 DIAGNOSIS — E782 Mixed hyperlipidemia: Secondary | ICD-10-CM

## 2023-01-12 DIAGNOSIS — Z8249 Family history of ischemic heart disease and other diseases of the circulatory system: Secondary | ICD-10-CM

## 2023-01-12 DIAGNOSIS — F32A Depression: Secondary | ICD-10-CM

## 2023-01-12 DIAGNOSIS — I10 Essential (primary) hypertension: Secondary | ICD-10-CM

## 2023-01-12 DIAGNOSIS — H409 Unspecified glaucoma: Secondary | ICD-10-CM

## 2023-01-12 DIAGNOSIS — I119 Hypertensive heart disease without heart failure: Secondary | ICD-10-CM

## 2023-01-12 DIAGNOSIS — N400 Enlarged prostate without lower urinary tract symptoms: Secondary | ICD-10-CM

## 2023-01-12 DIAGNOSIS — F419 Anxiety disorder, unspecified: Secondary | ICD-10-CM

## 2023-01-12 DIAGNOSIS — H43393 Other vitreous opacities, bilateral: Secondary | ICD-10-CM

## 2023-01-12 NOTE — Patient Instructions
 Recommend over the counter Refresh Relieva 1 drop each eye 3x daily. Patient Education Detached retina The Basics Written by the doctors and editors at UpToDate What is a detached retina? -- A detached retina is a serious eye condition that causes vision changes. It can lead to vision loss and even blindness.The retina is a layer of tissue at the back of the eye (picture 1). It sends nerve signals to the brain about what we see. If the retina gets a tear or hole, it can start to peel off the back of the eye. If this happens, the person can lose the ability to see.A detached retina most often happens for no clear reason. But older age or getting an eye injury can raise a person's risk. The risk is also higher in people who are very near-sighted. This means they can see clearly up close, but things at a distance look blurry.What are the symptoms of a detached retina? -- The symptoms include:Seeing dark or floating spots (doctors call these floaters). These can look like:A spiderwebA large spot that comes and goes - It might look like a big fly.Many small black spotsSmall floaters are very common. They usually don't mean there is an eye problem. But when they happen suddenly, become more frequent, or are very large, they can be a sign of a problem.Other symptoms can include:Seeing flashes of light - The flashes might be easier to see at night or in a dark room.Losing vision - A person might not be able to see detail, or only see shapes.Should I see a doctor or nurse? -- Yes. See your doctor or nurse right away if you suddenly lose vision or see flashes of light, large floaters, or many floaters. These could be warning signs of a detached retina. If it is not treated, it can lead to vision loss.Seeing floaters and flashes of light can also be a sign of a condition called posterior vitreous detachment. This condition is fairly common. It is not as serious as a detached retina. But people who get a posterior vitreous detachment need to see a doctor on a regular basis to make sure it does not become a detached retina.Is there a test for detached retina? -- Yes. To check for a detached retina, your doctor can do an eye exam and test your vision.If the doctor thinks you might have a detached retina, you will need to see an eye doctor. The eye doctor will do a procedure called a dilated eye exam. This means the doctor gives you eye drops that help them see inside your eye. Then the doctor looks at your retina to see if it is detached. They will use some special devices or lights that help them see the retina.How is a detached retina treated? -- If the retina is detached, doctors treat it right away. This can help keep it from getting worse and causing blindness. Doctors can treat a detached retina by:Injecting a bubble of gas into the eye - This can push the retina back into place.Doing surgery - Doctors use different methods to put the detached retina back and hold it in place.All topics are updated as new evidence becomes available and our peer review process is complete.This topic retrieved from UpToDate on: Aug 04, 2021.Topic [id number] Version 17.0Release: 31.2.26 - C31.169? 2023 UpToDate, Inc. and/or its affiliates. All rights reserved.picture 1: Detached retina The retina is the tissue at the back of the eye. If it gets a tear or hole, it can start to  come off the back of the eye. This causes vision changes. It can cause vision loss or even blindness.Graphic 29518 Version 1.0Consumer Information Use and Disclaimer This generalized information is a limited summary of diagnosis, treatment, and/or medication information. It is not meant to be comprehensive and should be used as a tool to help the user understand and/or assess potential diagnostic and treatment options. It does NOT include all information about conditions, treatments, medications, side effects, or risks that may apply to a specific patient. It is not intended to be medical advice or a substitute for the medical advice, diagnosis, or treatment of a health care provider based on the health care provider's examination and assessment of a patient's specific and unique circumstances. Patients must speak with a health care provider for complete information about their health, medical questions, and treatment options, including any risks or benefits regarding use of medications. This information does not endorse any treatments or medications as safe, effective, or approved for treating a specific patient. UpToDate, Inc. and its affiliates disclaim any warranty or liability relating to this information or the use thereof.The use of this information is governed by the Terms of Use, available at https://www.wolterskluwer.com/en/know/clinical-effectiveness-terms ?2023 UpToDate, Inc. and its affiliates and/or licensors. All rights reserved.Copyright ? 2023 UpToDate, Inc. and/or its affiliates. All rights reserved.

## 2023-01-13 ENCOUNTER — Ambulatory Visit: Admit: 2023-01-13 | Payer: MEDICARE

## 2023-01-15 ENCOUNTER — Encounter: Admit: 2023-01-15 | Payer: PRIVATE HEALTH INSURANCE | Attending: Ophthalmology

## 2023-01-15 ENCOUNTER — Ambulatory Visit: Admit: 2023-01-15 | Payer: MEDICARE | Attending: Medical Oncology

## 2023-01-15 ENCOUNTER — Ambulatory Visit: Admit: 2023-01-15 | Payer: MEDICARE | Attending: Pharmacist Clinician (PhC)/ Clinical Pharmacy Specialist

## 2023-01-15 ENCOUNTER — Inpatient Hospital Stay: Admit: 2023-01-15 | Discharge: 2023-01-15 | Payer: MEDICARE

## 2023-01-15 ENCOUNTER — Encounter: Admit: 2023-01-15 | Payer: PRIVATE HEALTH INSURANCE

## 2023-01-15 VITALS — BP 129/82 | HR 76 | Temp 98.20000°F | Resp 18 | Ht 74.0 in | Wt 200.4 lb

## 2023-01-15 DIAGNOSIS — Z883 Allergy status to other anti-infective agents status: Secondary | ICD-10-CM

## 2023-01-15 DIAGNOSIS — Z8249 Family history of ischemic heart disease and other diseases of the circulatory system: Secondary | ICD-10-CM

## 2023-01-15 DIAGNOSIS — E782 Mixed hyperlipidemia: Secondary | ICD-10-CM

## 2023-01-15 DIAGNOSIS — C4491 Basal cell carcinoma of skin, unspecified: Secondary | ICD-10-CM

## 2023-01-15 DIAGNOSIS — Z79899 Other long term (current) drug therapy: Secondary | ICD-10-CM

## 2023-01-15 DIAGNOSIS — I119 Hypertensive heart disease without heart failure: Secondary | ICD-10-CM

## 2023-01-15 DIAGNOSIS — Z9484 Stem cells transplant status: Secondary | ICD-10-CM

## 2023-01-15 DIAGNOSIS — F419 Anxiety disorder, unspecified: Secondary | ICD-10-CM

## 2023-01-15 DIAGNOSIS — R0609 Other forms of dyspnea: Secondary | ICD-10-CM

## 2023-01-15 DIAGNOSIS — C9200 Acute myeloblastic leukemia, not having achieved remission: Secondary | ICD-10-CM

## 2023-01-15 DIAGNOSIS — Z9481 Bone marrow transplant status: Secondary | ICD-10-CM

## 2023-01-15 DIAGNOSIS — I10 Essential (primary) hypertension: Secondary | ICD-10-CM

## 2023-01-15 DIAGNOSIS — N400 Enlarged prostate without lower urinary tract symptoms: Secondary | ICD-10-CM

## 2023-01-15 DIAGNOSIS — C931 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: Secondary | ICD-10-CM

## 2023-01-15 DIAGNOSIS — F32A Depression: Secondary | ICD-10-CM

## 2023-01-15 DIAGNOSIS — C9110 Chronic lymphocytic leukemia of B-cell type not having achieved remission: Secondary | ICD-10-CM

## 2023-01-15 DIAGNOSIS — H409 Unspecified glaucoma: Secondary | ICD-10-CM

## 2023-01-15 LAB — CBC WITH AUTO DIFFERENTIAL
BKR WAM ABSOLUTE IMMATURE GRANULOCYTES.: 0.02 x 1000/ÂµL (ref 0.00–0.30)
BKR WAM ABSOLUTE LYMPHOCYTE COUNT.: 0.65 x 1000/ÂµL (ref 0.60–3.70)
BKR WAM ABSOLUTE NRBC (2 DEC): 0 x 1000/ÂµL (ref 0.00–1.00)
BKR WAM ANC (ABSOLUTE NEUTROPHIL COUNT): 2.41 x 1000/ÂµL (ref 2.00–7.60)
BKR WAM BASOPHIL ABSOLUTE COUNT.: 0.03 x 1000/ÂµL (ref 0.00–1.00)
BKR WAM BASOPHILS: 0.8 % (ref 0.0–1.4)
BKR WAM EOSINOPHIL ABSOLUTE COUNT.: 0.04 x 1000/ÂµL (ref 0.00–1.00)
BKR WAM EOSINOPHILS: 1.1 % (ref 0.0–5.0)
BKR WAM HEMATOCRIT (2 DEC): 34.5 % — ABNORMAL LOW (ref 38.50–50.00)
BKR WAM HEMOGLOBIN: 11.9 g/dL — ABNORMAL LOW (ref 13.2–17.1)
BKR WAM IMMATURE GRANULOCYTES: 0.5 % (ref 0.0–1.0)
BKR WAM LYMPHOCYTES: 17.7 % (ref 17.0–50.0)
BKR WAM MCH (PG): 34.2 pg — ABNORMAL HIGH (ref 27.0–33.0)
BKR WAM MCHC: 34.5 g/dL (ref 31.0–36.0)
BKR WAM MCV: 99.1 fL (ref 80.0–100.0)
BKR WAM MONOCYTE ABSOLUTE COUNT.: 0.53 x 1000/ÂµL (ref 0.00–1.00)
BKR WAM MONOCYTES: 14.4 % — ABNORMAL HIGH (ref 4.0–12.0)
BKR WAM MPV: 10.3 fL (ref 8.0–12.0)
BKR WAM NEUTROPHILS: 65.5 % (ref 39.0–72.0)
BKR WAM NUCLEATED RED BLOOD CELLS: 0 % (ref 0.0–1.0)
BKR WAM PLATELETS: 166 x1000/ÂµL (ref 150–420)
BKR WAM RDW-CV: 13.9 % (ref 11.0–15.0)
BKR WAM RED BLOOD CELL COUNT.: 3.48 M/ÂµL — ABNORMAL LOW (ref 4.00–6.00)
BKR WAM WHITE BLOOD CELL COUNT: 3.7 x1000/ÂµL — ABNORMAL LOW (ref 4.0–11.0)

## 2023-01-15 LAB — COMPREHENSIVE METABOLIC PANEL
BKR A/G RATIO: 1.6 (ref 1.0–2.2)
BKR ALANINE AMINOTRANSFERASE (ALT): 23 U/L (ref 9–59)
BKR ALBUMIN: 4.1 g/dL (ref 3.6–5.1)
BKR ALKALINE PHOSPHATASE: 86 U/L (ref 9–122)
BKR ANION GAP: 11 (ref 7–17)
BKR ASPARTATE AMINOTRANSFERASE (AST): 21 U/L (ref 10–35)
BKR AST/ALT RATIO: 0.9
BKR BILIRUBIN TOTAL: 0.3 mg/dL (ref <=1.2)
BKR BLOOD UREA NITROGEN: 20 mg/dL (ref 8–23)
BKR BUN / CREAT RATIO: 16.7 (ref 8.0–23.0)
BKR CALCIUM: 9.4 mg/dL (ref 8.8–10.2)
BKR CHLORIDE: 107 mmol/L (ref 98–107)
BKR CO2: 22 mmol/L (ref 20–30)
BKR CREATININE DELTA: -0.23
BKR CREATININE: 1.2 mg/dL (ref 0.40–1.30)
BKR EGFR, CREATININE (CKD-EPI 2021): >60 mL/min/{1.73_m2} (ref >=60)
BKR GLOBULIN: 2.6 g/dL (ref 2.0–3.9)
BKR GLUCOSE: 100 mg/dL (ref 70–100)
BKR POTASSIUM: 4.2 mmol/L (ref 3.3–5.3)
BKR PROTEIN TOTAL: 6.7 g/dL (ref 5.9–8.3)
BKR SODIUM: 140 mmol/L (ref 136–144)

## 2023-01-15 LAB — LACTATE DEHYDROGENASE: BKR LACTATE DEHYDROGENASE: 206 U/L (ref 122–241)

## 2023-01-15 LAB — PT/INR AND PTT (BH GH L LMW YH)
BKR INR: 1.02 (ref 0.86–1.13)
BKR PARTIAL THROMBOPLASTIN TIME: 24.2 s (ref 23.0–31.0)
BKR PROTHROMBIN TIME: 11.2 s (ref 9.6–12.3)

## 2023-01-15 LAB — TACROLIMUS LEVEL     (BH GH L LMW YH): BKR TACROLIMUS BLOOD: 6.2 ng/mL

## 2023-01-15 LAB — TRIGLYCERIDES: BKR TRIGLYCERIDES: 201 mg/dL — ABNORMAL HIGH

## 2023-01-15 LAB — RETICULOCYTES
BKR WAM IRF: 9.5 % (ref 3.0–15.9)
BKR WAM RETICULOCYTE - ABS (3 DEC): 0.035 10Ë6 cells/uL (ref 0.023–0.140)
BKR WAM RETICULOCYTE COUNT PCT (1 DEC): 1 % (ref 0.6–2.7)
BKR WAM RETICULOCYTE HGB EQUIVALENT: 37.2 pg — ABNORMAL HIGH (ref 28.2–35.7)

## 2023-01-15 LAB — PHOSPHORUS     (BH GH L LMW YH): BKR PHOSPHORUS: 2.6 mg/dL (ref 2.2–4.5)

## 2023-01-15 LAB — MAGNESIUM: BKR MAGNESIUM: 1.6 mg/dL — ABNORMAL LOW (ref 1.7–2.4)

## 2023-01-15 LAB — HAPTOGLOBIN: BKR HAPTOGLOBIN: 159 mg/dL (ref 30–200)

## 2023-01-15 MED ORDER — ALBUTEROL SULFATE HFA 90 MCG/ACTUATION AEROSOL INHALER
90 | Freq: Once | RESPIRATORY_TRACT | Status: CP
Start: 2023-01-15 — End: ?
  Administered 2023-01-15: 18:00:00 90 mcg/actuation via RESPIRATORY_TRACT

## 2023-01-15 MED ORDER — CEPHALEXIN 500 MG CAPSULE
500 | ORAL_CAPSULE | Freq: Four times a day (QID) | ORAL | 1 refills | Status: AC
Start: 2023-01-15 — End: ?

## 2023-01-15 MED ORDER — PENTAMIDINE FOR INHALATION SOLUTION
Freq: Once | RESPIRATORY_TRACT | Status: CP
Start: 2023-01-15 — End: ?
  Administered 2023-01-15: 18:00:00 6.000 mL via RESPIRATORY_TRACT

## 2023-01-15 NOTE — Progress Notes
 Day +113 MUD. Pt feeling well other than L big toe blister. Denies fever/rash/diarrhea. Labs obtained from [facility], dressing changed. Tacrolimus 5.5mg  BID. Pentam administered by RT. Keflex prescribed for L toe. RTC 12/4

## 2023-01-15 NOTE — Other
 Post-HSCT Follow-Up Pharmacist note Diagnosis: CMMLConditioning:   Fludarabine/Melphalan with post-transplant Cyclophosphamide Transplant Type:10/10 MUD Allogeneic PBSCTTransplant Date (Day 0): 09/24/22 Referring Provider: Chrystine Oiler, MDEncounter Date: 01/15/2023 = D+113[name] is a 72 y.o. year-old male with history of CMML s/p HSCT. Patient is being seen by the clinic pharmacist for medication management included post stem cell transplant, hematological related disorders and GI related disorders .  This visit was completed as a joint-visit with provider present and is aware of the care plan.Subjective: Inquired about when we tacrolimus taper will be startedHeld tacrolimus prior to morning labs - took AM dose in clinic Assessment and Recommendations:  Summary of Medication Changes and Follow-up Items Discussed with Provider:Tacrolimus 6.2, continue current dose ,will start taper next weekPentamidine given todayParonychia in left big toe - started keflex 500 mg every four times daily x 7 days. GVHD (immunosuppression)Tacrolimus: Current dose: 5.5 mg PO twice daily11/29/24  level: 6.2, continue current dose Goal range: 6-11 ng/mLDosage dispensed: 0.5mg , 1mg , 5mg  capsulesPharmacy dispensed: OPSPost transplant cyclophosphamide: completed inpatient *All immunosuppression is managed by the bone marrow transplant team.Infectious Disease (prophylaxis, monitoring, and active infections)ViralCurrent prophylaxis/treatment: Acyclovir 400mg  PO twice dailyPrevious viral infections: 	HSV/VZV +/?CMV -/?, 11/19CMV negativeEBV IgG negativeBK virus N/APneumocystis jeuvecii pneumoniaCurrent prophylaxis/treatment: Pentamidine 300mg  inhaled monthly last given 11/29/24Previous PJP infection: N/AT-cell subsetDay +100 (12/22/22): 112Day +180 (date):1 year (date):Toxoplasmosis IgG negativeCurrent prophylaxis/treatment: NoneFungal/MoldCurrent prophylaxis/treatment: Isavuconazole 372mg  PO daily - history of prolonged cytopenias, d/c 11/13/24Previous fungal/mold infection: N/A, Hx of gingival changes to voriconazole Drug monitoring: N/AGalactomann - 10/01/22 negFungitell - 10/01/22 < 31Bacterial	Current prophylaxis/treatment: Keflex 500 mg po every 6 hours x 7 days	Previous bacterial infection: N/A	Drug monitoring: N/A	Blood cultures: N/ASupportive careHyperlipidemia monitoring (triglycerides, Cholesterol, LDL, HDL)Current treatment: None	Prior treatment: Atorvastatin 40mg  PO daily, held with transplantBaseline (09/18/2022): 68 Today (01/15/23): 201Hypertension monitoring (blood pressure)Current treatment: Amlodipine 10mg  PO dailyPrior treatment: Metoprolol and lisinopril on hold since transplant Today (01/15/23):  129/82GastrointestionalStress ulcer prophylaxis: None, one episode of acid refluxNausea/Vomiting: Intermittent nausea, takes ondansetron with good effect. Doesn't find that prochlorperazine has much benefit.Diarrhea/constipation: Continues to have soft stools, loperamidine prnOral care and mucositis: NoneOther: Discussed using plastic utensils for meals at home given ongoing metallic taste in the mouth that affects appetite. Veno-occlusive diseaseCurrent treatment:  Ursodiol 500mg  PO twice daily until day +90, d/c 11/5/24Anticoagulation	Current prophylaxis/treatment: None	Previous anticoagulation history: Aspirin 81mg  PO daily, for cardiac ppx - not restartingElectrolyte supplementation:	Current treatment: Magnesium oxide 400mg  BID	Monitoring: Mg 1.6 11/29/24Hypogammaglobulinemia	Current treatment:	Goal: 500-800 ng/dLIVIG last dose:Baseline IgG level:Last IgG level:IgG scheduled levelsDay +100 (12/22/22): 856Day +180 (date):	1 year (date):	Maintenance Therapy Plan: VaccinationsBaseline titers: 	Hepatitis B: 	Pneumococcal: VZV 	Measles: , Mumps: , Rubella: 12 month: 14 month: 18 month:	Hep B titer	Pneumococcal titers24 month:Titers drawn 2 months after 24 months:Redose needed:Medication History:Douglas [name] presents to today's visit with their med action plan.  A medication history was obtained from the patient and spouse.Adherence:The patient reports 0 missed doses of medications in the past 1 week(s). The patient does not report the barriers to adherence. Discussed potential outcomes associated with non-adherenceAffordability:The patient denies concerns with affordability of medications at this time. Medication Management:Plan for medication management assistance: An updated medication action plan was not given to the patient, no changesPatient would benefit from continued pharmacy follow-up at future clinic visits for medication managementEducated patient on the above changesPatient did not request any refills todayObjective: [id number] Results (from the past week) CBC auto differential  Collection Time: 01/15/23 11:54 AM Result Value Ref Range  WBC 3.7 (L) 4.0 - 11.0 x1000/?L  RBC 3.48 (L) 4.00 - 6.00 M/?L  Hemoglobin 11.9 (L) 13.2 - 17.1 g/dL  Hematocrit 96.04 (L) 54.09 - 50.00 %  MCV 99.1 80.0 - 100.0 fL  MCH 34.2 (H) 27.0 - 33.0 pg  MCHC 34.5 31.0 - 36.0 g/dL  RDW-CV 81.1 91.4 - 78.2 %  Platelets 166 150 - 420 x1000/?L  MPV 10.3 8.0 - 12.0 fL  Neutrophils 65.5 39.0 - 72.0 %  Lymphocytes 17.7 17.0 - 50.0 %  Monocytes 14.4 (H) 4.0 - 12.0 %  Eosinophils 1.1 0.0 - 5.0 %  Basophil 0.8 0.0 - 1.4 %  Immature Granulocytes 0.5 0.0 - 1.0 %  nRBC 0.0 0.0 - 1.0 %  Absolute Lymphocyte Count 0.65 0.60 - 3.70 x 1000/?L  Monocyte Absolute Count 0.53 0.00 - 1.00 x 1000/?L  Eosinophil Absolute Count 0.04 0.00 - 1.00 x 1000/?L  Basophil Absolute Count 0.03 0.00 - 1.00 x 1000/?L  Absolute Immature Granulocyte Count 0.02 0.00 - 0.30 x 1000/?L  Absolute nRBC 0.00 0.00 - 1.00 x 1000/?L  ANC (Abs Neutrophil Count) 2.41 2.00 - 7.60 x 1000/?L [id number] Results (from the past week) Comprehensive metabolic panel  Collection Time: 01/15/23 11:54 AM Result Value Ref Range  Sodium 140 136 - 144 mmol/L  Potassium 4.2 3.3 - 5.3 mmol/L  Chloride 107 98 - 107 mmol/L  CO2 22 20 - 30 mmol/L  Anion Gap 11 7 - 17  Glucose 100 70 - 100 mg/dL  BUN 20 8 - 23 mg/dL  Creatinine 5.78 4.69 - 1.30 mg/dL  Calcium 9.4 8.8 - 62.9 mg/dL  BUN/Creatinine Ratio 52.8 8.0 - 23.0  Total Protein 6.7 5.9 - 8.3 g/dL  Albumin 4.1 3.6 - 5.1 g/dL  Total Bilirubin 0.3 <=4.1 mg/dL  Alkaline Phosphatase 86 9 - 122 U/L  Alanine Aminotransferase (ALT) 23 9 - 59 U/L  Aspartate Aminotransferase (AST) 21 10 - 35 U/L  Globulin 2.6 2.0 - 3.9 g/dL  A/G Ratio 1.6 1.0 - 2.2  AST/ALT Ratio 0.9 Reference Range Not Established  eGFR (Creatinine) >60 >=60 mL/min/1.33m2  Creatinine Delta -0.23 See Comment LDH: Recent Results (from the past week) Lactate dehydrogenase  Collection Time: 01/15/23 11:54 AM Result Value Ref Range  LD 206 122 - 241 U/L Patient Education: The following education was provided for: Initiation, modification, or discontinuation of drug therapy, Assessment of efficacy and safety of drug therapy, and Adverse effect presentation, evaluation, and monitoring during today?s visit to the patient and spouse regarding medication management: purpose of each medication, side effects of medications, and importance of adherence with the regimen(s) Time Spent: 15 minutes, face-to-face consultPATIENT AND/OR CAREGIVER VERBALIZED UNDERSTANDING OF CARE PLAN: yesPATIENT ADVISED TO CALL BACK WITH QUESTIONS, CONCERNS, OR CHANGE IN SYMPTOMS.Jorene Guest, PharmD BCPS BCOP11/29/24

## 2023-01-15 NOTE — Progress Notes
 TRANSPLANT ATTENDINGFOLLOW UP Douglas [name] NOTEPT IDENTIFICATION: Douglas [name] is 72 y.o. male with CMML referred for consideration of allogeneic stem cell transplant at the request of Dr. Sherrell Puller INTERIM HISTORY: Douglas [name] is day 46 post transplant. Overall, Douglas [name] is doing well. Douglas [name] reports seeing floaters in right eye for last 10 days. Douglas [name] denies pain or change in vision. Douglas [name] has been eating and drinking better. His bowel movements are normal. Douglas [name] denies cold, rash, fever, or interim infection.  We reviewed the current medication list and made the necessary changes to chart. HPI:   Douglas [name] has a hx of Hypertension, hyperlipidemia, basal cell cancer of the skin, iron deficiency anemia.  The Douglas [name] was evaluated for cytopenias in 2021 in [place].  Initial evaluation from [facility], source records are not available today.  Per the Douglas [name]'s subsequent records, Douglas [name] initially presented with leukopenia, thrombocytopenia, and monocytosis.  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  Initial bone marrow was performed on August 24, 2019, was reviewed at [facility] with findings of multilineage dysplasia.  The marrow was 40% cellular.  Megakaryocytes showed frequent dysplasia with  hyperchromatic forms and clustering.  Blasts were not increased.  A FISH panel for MDS  loci was negative.  Karyotype was normal. 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  The Douglas [name] was observed and was without any treatment or transfusions.  In 2023, Douglas [name] had an acute drop in hemoglobin, was found to be iron deficient.  Workup for this did not show a clear focus of blood loss or bleeding.  The Douglas [name] has continued evaluation and established care with Dr. Sherrell Puller in June 2023 as Douglas [name] spends some summers in [place].  Of note, there is a label of CLL in the Douglas [name]'s chart, which I suspect was a typo, and find no evidence that support that diagnosis.  Dr. Dema Severin assessment in June 2023 per the CPSS molecular score being considered intermediate one with a low risk of development of leukemia and median survival over 5 years.  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  The Douglas [name] had a marrow performed at [facility] on 29th with CD34 stain showing 10% to 15% blasts focally more than 20%.  Marrow was hypercellular at 70%.  Dysplastic megakaryocytes again appreciated.  Molecular studies are pending.  Given the findings, treatment with the decitabine and venetoclax was recommended.  A referral has been placed for urgent EGD colonoscopy given the low iron concern for occult source of bleeding.Douglas [name] started treatment last week which Douglas [name] is tolerated well. Douglas [name] received 5-6 red blood cell transfusion in the past several month. Douglas [name] was suffering weakness and dyspnea which is now improved after transfusion. Douglas [name] does not have a significant infectious history, Douglas [name] did have COVID-19 in the past from which Douglas [name] recovered. Douglas [name] has hypertension and hyperlipidemia managed by cardiologist in [place]. Douglas [name] reports a negative stress test in the past. Douglas [name] has had number of skin cancers and has had several MOHS procedures. Douglas [name] is followed with a urologist for some BPH and abnormal PSA. Douglas [name] has had 2 prostate biopsies which was negative. Douglas [name] carries a diagnosis of iron deficiency and require transfusion in [place] after a significant and acute drop in hemoglobin. There was no clinical evidence of  bleeding. Endoscopy and colonoscopy are planned to exclude a source of bleeding. Douglas [name] is a 72 yo gentlemen that lives with is wife in [place].  Douglas [name] is currently spending the summer in [place] visiting family.  Douglas [name] has 2 daughters and 5 grandchildren.  Retired, having worked for [employer].  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 course with exposure to chemicals/  No history of Financial planner.  Douglas [name] is a social drinker of 1-2 beers per week, denies illicit drug use and is a non smoker.  Covid-19 vaccine series completed. 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)   CLL (chronic lymphocytic leukemia) (HC Code)   Depression   DOE (dyspnea on exertion)   Enlarged prostate   Essential hypertension   Family history of ischemic heart disease   Glaucoma   Hypertensive heart disease without heart failure   Mixed hyperlipidemia  Surgical HistoryPast Surgical History: Procedure Laterality Date  ADENOIDECTOMY    BONE MARROW BIOPSY    PROSTATE BIOPSY    TONSILLECTOMY    VASCULAR SURGERY   AllergiesAllergies Allergen Reactions  Voriconazole Other (See Comments)   Gingival edema/redness making it difficult to bite down  Current Outpatient Medications:   acyclovir (ZOVIRAX) 400 mg tablet, Take 1 tablet (400 mg total) by mouth every 12 (twelve) hours., Disp: 180 tablet, Rfl: 3  amLODIPine (NORVASC) 10 mg tablet, Take 1 tablet (10 mg total) by mouth., Disp: , Rfl:   amLODIPine (NORVASC) 10 mg tablet, Take 1 tablet (10 mg total) by mouth daily., Disp: , Rfl:   finasteride (PROSCAR) 5 mg tablet, TAKE 1 TABLET BY MOUTH DAILY FOR ENLARGED PROSTATE WITH URINATION PROBLEM TAKE WITH OR WITHOUT MEALS, Disp: , Rfl:   folic acid (FOLVITE) 1 mg tablet, Take 1 tablet (1 mg total) by mouth daily., Disp: 30 tablet, Rfl: 11  isavuconazonium (CRESEMBA) 186 mg capsule, Take 2 capsules (372 mg total) by mouth daily., Disp: 56 capsule, Rfl: 11  latanoprost (XALATAN) 0.005 % ophthalmic solution, Place 1 drop into both eyes nightly., Disp: , Rfl:   magnesium oxide 400 mg magnesium Cap, Take 400 mg by mouth daily., Disp: , Rfl:   Miscellaneous Medical Supply, Please provide rolling walker, Use as directed., Disp: 1 each, Rfl: 0  multivitamin tablet, Take 1 tablet by mouth daily., Disp: 30 tablet, Rfl: 11  ondansetron (ZOFRAN) 8 mg tablet, Take 1 tablet (8 mg total) by mouth every 8 (eight) hours as needed for nausea or vomiting. (Douglas [name] not taking: Reported on 12/16/2022), Disp: 30 tablet, Rfl: 2  pentamidine (PENTAM) 300 mg inhalation solution, Inhale 6 mLs (300 mg total) into the lungs every 28 days. Last given 12/16/22, Disp: , Rfl:   sodium chloride 0.9 % flush, Use 10 mLs by IV Push route daily. In both lumens, Disp: 600 mL, Rfl: 11  tacrolimus (PROGRAF) 5 mg Immediate Release capsule, Take 1 capsule (5 mg total) by mouth 2 (two) times daily. (Douglas [name] taking differently: Take 1 capsule (5 mg total) by mouth 2 (two) times daily. 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 (HC Code)  CLL (chronic lymphocytic leukemia) (HC Code)   Depression   DOE (dyspnea on exertion)   Enlarged prostate   Essential hypertension   Family history of ischemic heart disease   Glaucoma   Hypertensive heart disease without heart failure   Mixed hyperlipidemia  Family History Problem Relation Age of Onset  Breast cancer Mother   Liver cancer Mother   Heart disease Father   Prostate cancer Father   Breast cancer Sister   Thyroid disease Sister   Hearing loss Sister   Diabetes Daughter   No Known Problems Daughter   No Known Problems Grandchild  Social History Socioeconomic History  Marital status: Married   Spouse name: Not on file  Number of children: Not on file  Years of education: Not on file  Highest education level: Not on file Occupational History  Not on file Tobacco Use  Smoking status: Never  Smokeless tobacco: Never Vaping Use  Vaping Use: Never used Substance and Sexual Activity  Alcohol use: Yes   Alcohol/week: 2.0 standard drinks of alcohol   Types: 2 Cans of beer per week   Comment: social drinker once a week  Drug use: Never  Sexual activity: Not on file Other Topics Concern  Not on file Social History Narrative  Not on file Social Determinants of Health Financial Resource Strain: Low Risk  (08/05/2021)  Overall Financial Resource Strain (CARDIA)   Difficulty of Paying Living Expenses: Not very hard Food Insecurity: No Food Insecurity (09/18/2022)  Hunger Vital Sign   Worried About Running Out of Food in the Last Year: Never true   Ran Out of Food in the Last Year: Never true Transportation Needs: No Transportation Needs (09/18/2022)  PRAPARE - Designer, jewellery (Medical): No   Lack of Transportation (Non-Medical): No Physical Activity: Not on file Stress: Not on file Social Connections: Not on file Intimate Partner Violence: Not on file Housing Stability: Low Risk  (09/18/2022)  Housing Stability   Housing Stability: I have a steady place to live   Housing Stability: Not on file EXAM: General Appearance:Douglas [name] is heavy set, appears wellHEENT:  Mucous membranes moist, No oral lesions, no erythema, teeth are adequate repair Lungs: ClearCor: RRR, no m/r/gAbd: soft nontender, no hepatosplenomegaly, Extr: trace edema.  Skin: some follicular erythema noted on the chest and abdomen, which is mild. Lymph Nodes - no palpable adenopathy. Neurologic: mentation appears normal, normal speech, strength symmetric. LABSLab Results Component Value Date  WBC 3.4 (L) 12/22/2022  HGB 12.0 (L) 12/22/2022  HCT 35.50 (L) 12/22/2022  MCV 100.0 12/22/2022  PLT 163 12/22/2022 Comprehensive Metabolic Panel:Lab Results Component Value Date  GLU 111 (H) 12/22/2022  BUN 18 12/22/2022  CREATININE 1.28 12/22/2022  NA 139 12/22/2022  K 4.6 12/22/2022  CL 106 12/22/2022  CO2 21 12/22/2022  ALBUMIN 4.3 12/22/2022  PROT 6.7 12/22/2022  BILITOT 0.3 12/22/2022  ALKPHOS 88 12/22/2022  ALT 21 12/22/2022  GLOB 2.4 12/22/2022  CALCIUM 9.3 12/22/2022 Results in Past 7 DaysResult Component Current Result LD 250 (H) (12/22/2022) ]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  His disease is characterized by minor cytopenias over a long period of observation with recently worsened anemia, thrombocytopenia, and a marrow with increased blasts consistent with disease progression. 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 Therapy was   started with decitabine and venetoclax, and transplantation  recommended given the now increased risk of shorten survival. The Douglas [name] has had a formal  search of the registry and a  10/10 match was identified.A/P: Day 89 no evidence of gvhd. 1) Disease - Bone marrow biopsy September 10th, this was consistent with remission by morphology. Donor percentage was 100%, FISH and gene panel are normal.  Plan bone marrow biopsy around day 100 in the transplant clinic. 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 Goal is tac 5-10, check weekly ldh, haptoglobin, triglycerides. Will plan to taper tacrolimus in December. 3) Heme - Donor, Recipient both blood type A+ve. Blood counts looks good today. 4) ID - Prophylaxis with acyclovir, pentamidine, Cresemba. Douglas [name] is not at risk for CMV. 5) Hypertension - Douglas [name] is on continued Norvasc. 6) Liver: Continue Ursodiol, monitor mild LFT changes. Normal today. 7) History of skin cancer: Needs to be addressed after 3 month time point.8) Abnormal spine, lesion on MRI pre- transplant: Planned follow up MRI in 3-4 months. 9) Floaters: Will refer to ophthalmology. Dr. Chrystine Oiler MD Scribed for Chrystine Oiler, MD by Simona Huh, medical scribe November 5, 2024The documentation recorded by the scribe accurately reflects the services I personally performed and the decisions made by me. I reviewed and confirmed all material entered and/or pre-charted by the scribe.

## 2023-01-16 LAB — CYTOMEGALOVIRUS QUANTITATIVE PCR, PLASMA     (BH GH L LMW YH): BKR CMV QUANTITATIVE PCR: NOT DETECTED

## 2023-01-19 ENCOUNTER — Ambulatory Visit: Admit: 2023-01-19 | Payer: MEDICARE

## 2023-01-19 ENCOUNTER — Ambulatory Visit: Admit: 2023-01-19 | Payer: MEDICARE | Attending: Medical Oncology

## 2023-01-19 LAB — CCS, HEME     (YH)

## 2023-01-20 ENCOUNTER — Telehealth: Admit: 2023-01-20 | Payer: PRIVATE HEALTH INSURANCE | Attending: Medical Oncology

## 2023-01-20 ENCOUNTER — Inpatient Hospital Stay: Admit: 2023-01-20 | Discharge: 2023-01-20 | Payer: MEDICARE

## 2023-01-20 ENCOUNTER — Ambulatory Visit: Admit: 2023-01-20 | Payer: PRIVATE HEALTH INSURANCE

## 2023-01-20 VITALS — BP 123/77 | HR 77 | Temp 98.20000°F | Resp 18 | Wt 199.7 lb

## 2023-01-20 DIAGNOSIS — Z79624 Long term (current) use of inhibitors of nucleotide synthesis: Secondary | ICD-10-CM

## 2023-01-20 DIAGNOSIS — Z79621 Long term (current) use of calcineurin inhibitor: Secondary | ICD-10-CM

## 2023-01-20 DIAGNOSIS — Z9484 Stem cells transplant status: Secondary | ICD-10-CM

## 2023-01-20 DIAGNOSIS — C931 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: Secondary | ICD-10-CM

## 2023-01-20 DIAGNOSIS — C9200 Acute myeloblastic leukemia, not having achieved remission: Secondary | ICD-10-CM

## 2023-01-20 DIAGNOSIS — Z79899 Other long term (current) drug therapy: Secondary | ICD-10-CM

## 2023-01-20 DIAGNOSIS — Z7969 Long term (current) use of other immunomodulators and immunosuppressants: Secondary | ICD-10-CM

## 2023-01-20 DIAGNOSIS — I10 Essential (primary) hypertension: Secondary | ICD-10-CM

## 2023-01-20 DIAGNOSIS — Z9481 Bone marrow transplant status: Secondary | ICD-10-CM

## 2023-01-20 DIAGNOSIS — D84821 Immunodeficiency due to drugs: Secondary | ICD-10-CM

## 2023-01-20 DIAGNOSIS — C9110 Chronic lymphocytic leukemia of B-cell type not having achieved remission: Secondary | ICD-10-CM

## 2023-01-20 LAB — COMPREHENSIVE METABOLIC PANEL
BKR A/G RATIO: 1.3 (ref 1.0–2.2)
BKR ALANINE AMINOTRANSFERASE (ALT): 24 U/L (ref 9–59)
BKR ALBUMIN: 3.7 g/dL (ref 3.6–5.1)
BKR ALKALINE PHOSPHATASE: 83 U/L (ref 9–122)
BKR ANION GAP: 11 (ref 7–17)
BKR ASPARTATE AMINOTRANSFERASE (AST): 28 U/L (ref 10–35)
BKR AST/ALT RATIO: 1.2
BKR BILIRUBIN TOTAL: 0.4 mg/dL (ref <=1.2)
BKR BLOOD UREA NITROGEN: 18 mg/dL (ref 8–23)
BKR BUN / CREAT RATIO: 15.8 (ref 8.0–23.0)
BKR CALCIUM: 9 mg/dL (ref 8.8–10.2)
BKR CHLORIDE: 108 mmol/L — ABNORMAL HIGH (ref 98–107)
BKR CO2: 21 mmol/L (ref 20–30)
BKR CREATININE DELTA: -0.06
BKR CREATININE: 1.14 mg/dL (ref 0.40–1.30)
BKR EGFR, CREATININE (CKD-EPI 2021): >60 mL/min/{1.73_m2} (ref >=60)
BKR GLOBULIN: 2.9 g/dL (ref 2.0–3.9)
BKR GLUCOSE: 135 mg/dL — ABNORMAL HIGH (ref 70–100)
BKR POTASSIUM: 4 mmol/L (ref 3.3–5.3)
BKR PROTEIN TOTAL: 6.6 g/dL (ref 5.9–8.3)
BKR SODIUM: 140 mmol/L (ref 136–144)

## 2023-01-20 LAB — CBC WITH AUTO DIFFERENTIAL
BKR WAM ABSOLUTE IMMATURE GRANULOCYTES.: 0.01 x 1000/ÂµL (ref 0.00–0.30)
BKR WAM ABSOLUTE LYMPHOCYTE COUNT.: 0.55 x 1000/ÂµL — ABNORMAL LOW (ref 0.60–3.70)
BKR WAM ABSOLUTE NRBC (2 DEC): 0 x 1000/ÂµL (ref 0.00–1.00)
BKR WAM ANC (ABSOLUTE NEUTROPHIL COUNT): 2.37 x 1000/ÂµL (ref 2.00–7.60)
BKR WAM BASOPHIL ABSOLUTE COUNT.: 0.03 x 1000/ÂµL (ref 0.00–1.00)
BKR WAM BASOPHILS: 0.9 % (ref 0.0–1.4)
BKR WAM EOSINOPHIL ABSOLUTE COUNT.: 0.06 x 1000/ÂµL (ref 0.00–1.00)
BKR WAM EOSINOPHILS: 1.7 % (ref 0.0–5.0)
BKR WAM HEMATOCRIT (2 DEC): 33.3 % — ABNORMAL LOW (ref 38.50–50.00)
BKR WAM HEMOGLOBIN: 12 g/dL — ABNORMAL LOW (ref 13.2–17.1)
BKR WAM IMMATURE GRANULOCYTES: 0.3 % (ref 0.0–1.0)
BKR WAM LYMPHOCYTES: 15.9 % — ABNORMAL LOW (ref 17.0–50.0)
BKR WAM MCH (PG): 35.5 pg — ABNORMAL HIGH (ref 27.0–33.0)
BKR WAM MCHC: 36 g/dL (ref 31.0–36.0)
BKR WAM MCV: 98.5 fL (ref 80.0–100.0)
BKR WAM MONOCYTE ABSOLUTE COUNT.: 0.45 x 1000/ÂµL (ref 0.00–1.00)
BKR WAM MONOCYTES: 13 % — ABNORMAL HIGH (ref 4.0–12.0)
BKR WAM MPV: 10.3 fL (ref 8.0–12.0)
BKR WAM NEUTROPHILS: 68.2 % (ref 39.0–72.0)
BKR WAM NUCLEATED RED BLOOD CELLS: 0 % (ref 0.0–1.0)
BKR WAM PLATELETS: 160 x1000/ÂµL (ref 150–420)
BKR WAM RDW-CV: 13.9 % (ref 11.0–15.0)
BKR WAM RED BLOOD CELL COUNT.: 3.38 M/ÂµL — ABNORMAL LOW (ref 4.00–6.00)
BKR WAM WHITE BLOOD CELL COUNT: 3.5 x1000/ÂµL — ABNORMAL LOW (ref 4.0–11.0)

## 2023-01-20 LAB — PT/INR AND PTT (BH GH L LMW YH)
BKR INR: 1.04 (ref 0.86–1.13)
BKR PARTIAL THROMBOPLASTIN TIME: 23.2 s (ref 23.0–31.0)
BKR PROTHROMBIN TIME: 11.4 s (ref 9.6–12.3)

## 2023-01-20 LAB — PHOSPHORUS     (BH GH L LMW YH): BKR PHOSPHORUS: 2.7 mg/dL (ref 2.2–4.5)

## 2023-01-20 LAB — TACROLIMUS LEVEL     (BH GH L LMW YH): BKR TACROLIMUS BLOOD: 8.1 ng/mL

## 2023-01-20 LAB — RETICULOCYTES
BKR WAM IRF: 9.8 % (ref 3.0–15.9)
BKR WAM RETICULOCYTE - ABS (3 DEC): 0.034 10Ë6 cells/uL (ref 0.023–0.140)
BKR WAM RETICULOCYTE COUNT PCT (1 DEC): 1 % (ref 0.6–2.7)
BKR WAM RETICULOCYTE HGB EQUIVALENT: 39.3 pg — ABNORMAL HIGH (ref 28.2–35.7)

## 2023-01-20 LAB — HAPTOGLOBIN: BKR HAPTOGLOBIN: 157 mg/dL (ref 30–200)

## 2023-01-20 LAB — TRIGLYCERIDES: BKR TRIGLYCERIDES: 165 mg/dL — ABNORMAL HIGH

## 2023-01-20 LAB — MAGNESIUM: BKR MAGNESIUM: 1.7 mg/dL (ref 1.7–2.4)

## 2023-01-20 NOTE — Progress Notes
 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 He reports feeling well. No fever or chills. He is eating and drinking, he reports his appetite and energy are improved. No N/V/D, no itch or rash. He reports his toe is better still looks a little swollen no pain, he had not heard from podiatry. He is eager to move back home ans asking that if he has his Hickman removed will we replace it with a port. Review of Systems Constitutional: Negative.  Negative for chills, fever and weight loss. Eyes: Negative.       Note that when he gazes laterally with his right eye, he occasionally sees a halo or a burst of light. Not consistent. Respiratory: Negative.   Cardiovascular: Negative.  Negative for chest pain and palpitations. Gastrointestinal:  Negative for abdominal pain, constipation, diarrhea, nausea and vomiting. Musculoskeletal:  Negative for falls.      Right shoulder 85% better Skin: Negative.  Negative for itching and rash. Neurological:  Positive for tremors (Shakiness of hands.). Negative for dizziness and headaches. Psychiatric/Behavioral: Negative.   VoriconazoleCurrent Outpatient Medications on File Prior to Visit Medication Sig Dispense Refill  acyclovir (ZOVIRAX) 400 mg tablet Take 1 tablet (400 mg total) by mouth every 12 (twelve) hours. 180 tablet 3  amLODIPine (NORVASC) 10 mg tablet Take 1 tablet (10 mg total) by mouth daily.    finasteride (PROSCAR) 5 mg tablet TAKE 1 TABLET BY MOUTH DAILY FOR ENLARGED PROSTATE WITH URINATION PROBLEM TAKE WITH OR WITHOUT MEALS    folic acid (FOLVITE) 1 mg tablet Take 1 tablet (1 mg total) by mouth daily. 30 tablet 11  isavuconazonium (CRESEMBA) 186 mg capsule Take 2 capsules (372 mg total) by mouth daily. 56 capsule 11  latanoprost (XALATAN) 0.005 % ophthalmic solution Place 1 drop into both eyes nightly.    magnesium oxide 400 mg magnesium Cap Take 400 mg by mouth daily.    Miscellaneous Medical Supply Please provide rolling walker, Use as directed. 1 each 0  multivitamin tablet Take 1 tablet by mouth daily. 30 tablet 11  ondansetron (ZOFRAN) 8 mg tablet Take 1 tablet (8 mg total) by mouth every 8 (eight) hours as needed for nausea or vomiting. (Patient not taking: Reported on 12/01/2022) 30 tablet 2  pentamidine (PENTAM) 300 mg inhalation solution Inhale 6 mLs (300 mg total) into the lungs every 28 days. Last given 12/16/22    sodium chloride 0.9 % flush Use 10 mLs by IV Push route daily. In both lumens 600 mL 11  tacrolimus (PROGRAF) 5 mg Immediate Release capsule Take 1 capsule (5 mg total) by mouth 2 (two) times daily. (Patient taking differently: Take 1 capsule (5 mg total) by mouth 2 (two) times daily. 5 mg bid) 60 capsule 11  ursodioL (URSO FORTE) 500 mg tablet Take 1 tablet (500 mg total) by mouth 2 times daily (9am, 5pm). 60 tablet 6  [DISCONTINUED] venetoclax (VENCLEXTA) 100 mg tablet Take 4 tablets (400 mg total) by mouth once daily. Take with food. Swallow whole, do not crush. 120 tablet 0 Current Facility-Administered Medications on File Prior to Visit Medication Dose Route Frequency Provider Last Rate Last Admin  [COMPLETED] albuterol sulfate 90 mcg/actuation HFA aerosol inhaler 2 puff  2 puff Inhalation Once  [name], APRN   2 puff at 12/16/22 1041  [COMPLETED] pentamidine 300 mg INHALATION solution  300 mg Nebulization Once [name], APRN   300 mg at 12/16/22 1043 BP: 134/80 (12/16/2022 10:27 AM), Pulse: 85 (12/16/2022 10:27 AM), Temp: 97.9 ?F (36.6 ?C) (12/16/2022 10:27 AM), Resp: 16 (12/16/2022 10:27 AM), SpO2: 100 % (12/16/2022 10:27 AM), Pain Score: Zero (12/16/2022 10:23 AM), Weight: 90.8 kg (12/16/2022 10:23 AM), Weight (lbs): 200.18 (12/16/2022 10:23 AM)Physical ExamConstitutional:     Appearance: Normal appearance. He is not ill-appearing. HENT:    Head: Normocephalic.    Nose: Nose normal. No congestion.    Mouth/Throat:    Mouth: Mucous membranes are moist.    Pharynx: Oropharynx is clear. Eyes:    General: No scleral icterus.      Right eye: No discharge.       Left eye: No discharge. Cardiovascular:    Rate and Rhythm: Normal rate and regular rhythm.    Pulses: Normal pulses.    Heart sounds: Normal heart sounds. No murmur heard.Pulmonary:    Effort: Pulmonary effort is normal. No respiratory distress.    Breath sounds: Normal breath sounds. No wheezing or rales. Abdominal:    General: Abdomen is flat. Bowel sounds are normal. There is no distension.    Palpations: Abdomen is soft.    Tenderness: There is no abdominal tenderness. There is no guarding. Musculoskeletal:       General: No swelling.    Right lower leg: No edema.    Left lower leg: No edema. Skin:   General: Skin is warm.    Findings: No erythema or rash.    Comments: Paronychia to left great toe Neurological:    Mental Status: He is alert and oriented to person, place, and time. Psychiatric:       Behavior: Behavior normal. Lab Results Component Value Date/Time  WBC 3.1 (L) 12/16/2022 10:21 AM  HGB 12.1 (L) 12/16/2022 10:21 AM  HCT 33.90 (L) 12/16/2022 10:21 AM  PLT 168 12/16/2022 10:21 AM  Lab Results Component Value Date/Time  NEUTROPHILS 61.1 12/16/2022 10:21 AM  Lab Results Component Value Date/Time  NA 139 12/08/2022 09:22 AM  K 4.3 12/08/2022 09:22 AM  CL 106 12/08/2022 09:22 AM  CO2 21 12/08/2022 09:22 AM  BUN 18 12/08/2022 09:22 AM  CREATININE 1.23 12/08/2022 09:22 AM  GLU 109 (H) 12/08/2022 09:22 AM  ANIONGAP 12 12/08/2022 09:22 AM  Lab Results Component Value Date/Time  CALCIUM 9.5 12/08/2022 09:22 AM  MG 1.5 (L) 12/08/2022 09:22 AM  PHOS 2.6 12/08/2022 09:22 AM  Lab Results Component Value Date/Time  ALT 22 12/08/2022 09:22 AM  AST 21 12/08/2022 09:22 AM  ALKPHOS 83 12/08/2022 09:22 AM  BILITOT 0.3 12/08/2022 09:22 AM  BILIDIR 0.2 09/11/2022 12:31 PM  ALBUMIN 4.1 12/08/2022 09:22 AM  Lab Results Component Value Date/Time  PTT 23.9 12/16/2022 10:21 AM  LABPROT 11.8 12/16/2022 10:21 AM  INR 1.08 12/16/2022 10:21 AM  No results found for requested labs within last 7 days. No results found for: RAPAMYCINNo results found for requested labs within last 7 days. Assessment:Alassane Lacretia Nicks Hagenow is a 72 y.o. 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 He is s/p 2 cycles of chemotherapy with Decitabine + Venetoclax with reduction in blast count. 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 He is engrafted on day +13  and is discharged on 10/10/22. Plan: Day +118 No s/sx of GVHD. Starting Tac Taper. 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 Day 100 11/19 (clinic performed),NED 100% donor cytogenetics pendingNeeds BM,180 and 365Heme: No transfusions needed.Transfuse to maintain Hgb > 7 and plts >/=20 or if clinically indicatedInfectious Disease: Prophylactic Antimicrobials of Acyclovir, Cresemba(stopped 11/14 no fungal infections H/O prolonged neutropenia prior to transplant) and  Pentam Inhalation given last on 12/16/22 and will be repeated every 3-4 weeks Identified as CMV Risk: check periodically - not detected on 01/15/23- Paronychia left great toe completed Kelfex with improvement, waiting Podiatry appt.GVHD Prophylaxis: No s/sxPost Transplant Cy 25mg /kg IV daily on day 3 and 4 (dose reduced by 50% ) Tacrolimus 5.5 mg BID.  Starting Taper reduced to 4.4 on 12/4 with  level of 8.1ng/ml.No MMF with 10/10Monitor Triglycerides, Haptoglobin and LDH weekly on TacrolimusCV: HTN: Norvasc to 10 mg po dailyFluids, Electrolytes and Nutrition:Replete electrolytes PRNDiet - safe food handlingDaily MTV, Folic Acid and MagnesiumMSK: Right Shoulder 85%  back to his normal Right shoulder pain, full ROM with pain. Larey Seat on 11/08/22). Will continue to ice and take tylenol. Also recommended voltaren topical cream. Follow up pre transplant MRI lumbar spine 11/14:Stable appearing previously seen geographic 3 cm L2 vertebral body, without associated pathologic fracture or epidural soft tissue mass. CVAD: Hickman for chemotherapy and supportive care, ordered fro removal at end of DecemberPost Transplant immune reconstitution/vaccines: Date   Day   T cell subset   IGG 11/5  ~100 112 866   ~180     ~1 year   Plan on re-vaccinating at 1 year unless remain on immunosuppression. Vaccines : 12, 14 , 16 and 24 months s/p transplantHealth Maintenance Day +30 Day +100 Day +180 1 Year Heme/Immune       T cell subsets/IGG       Chimerism/Bone Marrow or PET/Imaging [x]  [x] [x]  [] []  [] []  Pulmonary       PFT's  []  []  []  Cardiology       Echo       EKG   []  Only if cardiac risk factors  [] []  [] []  Endocrine       TSH/TFT's       Fasting Lipids       Glucose/A1c       Vit D/Calcium        Bone Density (women and those on steroids)Reproductive Endocrine        FSH/LH/Testosterone (both men & women)   []  [] [] [] [] [] [] []  Liver      Ferritin      MRI T2* quant (if ferritin abnormal)    [] []  Ophthalmology       Sicca/Cataracts   []  []  Dermatology/musculo-skeletal        GVHD (skin & ROM)        Cancer screening (Derm referral)            [] []  [] []  Dental       GVHD       Routine Care   []  []              Majhail, et al. (2012). Recommended Screening and Preventive Practices for Long-Term Survivors after                    Hematopoietic Cell Transplantation. Biology of Blood and Marrow Transplantation, Volume 18,  Issue 3, March 2012, Pages 348-371.Conditioning/Transplant:-Melphalan 70 mg/m2 over 30 minutes on D-6, *Melphalan dosed reduced to 70 mg/m2 due to age-Fludarabine 40 mg/m2 over 30 minutes every 24hr for 4 doses from D-5 to D -2             -Stem cell infusion day 0 (09/24/22) RTC 1 week for labs and follow up with Dr Billey Gosling Brigitte Soderberg. APRNOffice #[phone]

## 2023-01-20 NOTE — Telephone Encounter
 [facility], called to report a test cancellation for LDH, sample hemolyzed.

## 2023-01-20 NOTE — Telephone Encounter
 Hemolyzed LD noted

## 2023-01-20 NOTE — Progress Notes
 Day + 118 post MUD for his AML.Left great toe swelling/blister resolving-continues on cephalexin po.No fever/chills; no nausea/vomiting/diarrhea.Skin intact; no rash/edema noted.No SOB; no cough nor nasal congestion.RDLH dressing and claves changed.Labs drawn-no hydration/repletion needed.Seen by Maureen-no changes made at this time.RTC in one week on a Friday.

## 2023-01-21 LAB — CYTOMEGALOVIRUS QUANTITATIVE PCR, PLASMA     (BH GH L LMW YH): BKR CMV QUANTITATIVE PCR: NOT DETECTED

## 2023-01-22 ENCOUNTER — Encounter: Admit: 2023-01-22 | Payer: PRIVATE HEALTH INSURANCE | Attending: Medical

## 2023-01-22 ENCOUNTER — Telehealth: Admit: 2023-01-22 | Payer: PRIVATE HEALTH INSURANCE | Attending: Hematology & Oncology

## 2023-01-22 DIAGNOSIS — Z9484 Stem cells transplant status: Secondary | ICD-10-CM

## 2023-01-22 MED ORDER — TACROLIMUS IMMEDIATE RELEASE 1 MG CAPSULE
1 | ORAL_CAPSULE | Freq: Two times a day (BID) | ORAL | 12 refills | 30.00 days | Status: AC
Start: 2023-01-22 — End: ?

## 2023-01-22 NOTE — Telephone Encounter
Noted  

## 2023-01-22 NOTE — Telephone Encounter
 Received home care orders from [facility]; placed in NP7 folder for review/signature

## 2023-01-25 ENCOUNTER — Encounter: Admit: 2023-01-25 | Payer: PRIVATE HEALTH INSURANCE | Attending: Medical

## 2023-01-25 DIAGNOSIS — Z9484 Stem cells transplant status: Secondary | ICD-10-CM

## 2023-01-25 MED ORDER — TACROLIMUS IMMEDIATE RELEASE 0.5 MG CAPSULE
0.5 | ORAL_CAPSULE | Freq: Two times a day (BID) | ORAL | 12 refills | 30.00 days | Status: AC
Start: 2023-01-25 — End: ?
  Filled 2023-01-25: qty 60, 30d supply, fill #0
  Filled 2023-01-25: qty 240, 30d supply, fill #0

## 2023-01-25 MED FILL — FOLIC ACID 1 MG TABLET: 1 mg | ORAL | 30 days supply | Qty: 30 | Fill #4 | Status: CP

## 2023-01-27 ENCOUNTER — Ambulatory Visit: Admit: 2023-01-27 | Payer: MEDICARE

## 2023-01-27 LAB — CHROMOSOME ANALYSIS (CYTOGENETICS) (YMG): BKR BANDING RESOLUTION: 400

## 2023-01-29 ENCOUNTER — Inpatient Hospital Stay: Admit: 2023-01-29 | Discharge: 2023-01-29 | Payer: PRIVATE HEALTH INSURANCE

## 2023-01-29 ENCOUNTER — Ambulatory Visit: Admit: 2023-01-29 | Payer: MEDICARE | Attending: Medical Oncology

## 2023-01-29 ENCOUNTER — Ambulatory Visit: Payer: PPO

## 2023-01-29 VITALS — BP 127/78 | HR 78 | Temp 98.70000°F | Resp 18 | Wt 200.0 lb

## 2023-01-29 DIAGNOSIS — I119 Hypertensive heart disease without heart failure: Secondary | ICD-10-CM

## 2023-01-29 DIAGNOSIS — Z9484 Stem cells transplant status: Secondary | ICD-10-CM

## 2023-01-29 DIAGNOSIS — E782 Mixed hyperlipidemia: Secondary | ICD-10-CM

## 2023-01-29 DIAGNOSIS — Z85828 Personal history of other malignant neoplasm of skin: Secondary | ICD-10-CM

## 2023-01-29 DIAGNOSIS — C9200 Acute myeloblastic leukemia, not having achieved remission: Secondary | ICD-10-CM

## 2023-01-29 DIAGNOSIS — Z79899 Other long term (current) drug therapy: Secondary | ICD-10-CM

## 2023-01-29 DIAGNOSIS — Z860100 Personal history of colon polyps, unspecified: Secondary | ICD-10-CM | POA: Diagnosis not present

## 2023-01-29 DIAGNOSIS — Z1211 Encounter for screening for malignant neoplasm of colon: Secondary | ICD-10-CM | POA: Diagnosis not present

## 2023-01-29 DIAGNOSIS — Z09 Encounter for follow-up examination after completed treatment for conditions other than malignant neoplasm: Secondary | ICD-10-CM | POA: Diagnosis not present

## 2023-01-29 DIAGNOSIS — K640 First degree hemorrhoids: Secondary | ICD-10-CM | POA: Diagnosis not present

## 2023-01-29 DIAGNOSIS — Z860101 Personal history of adenomatous and serrated colon polyps: Secondary | ICD-10-CM | POA: Diagnosis not present

## 2023-01-29 DIAGNOSIS — K5730 Diverticulosis of large intestine without perforation or abscess without bleeding: Secondary | ICD-10-CM | POA: Diagnosis not present

## 2023-01-29 DIAGNOSIS — L03032 Cellulitis of left toe: Secondary | ICD-10-CM

## 2023-01-29 DIAGNOSIS — Z883 Allergy status to other anti-infective agents status: Secondary | ICD-10-CM

## 2023-01-29 LAB — COMPREHENSIVE METABOLIC PANEL
BKR A/G RATIO: 1.2 (ref 1.0–2.2)
BKR ALANINE AMINOTRANSFERASE (ALT): 23 U/L (ref 9–59)
BKR ALBUMIN: 3.5 g/dL — ABNORMAL LOW (ref 3.6–5.1)
BKR ALKALINE PHOSPHATASE: 76 U/L (ref 9–122)
BKR ANION GAP: 10 (ref 7–17)
BKR ASPARTATE AMINOTRANSFERASE (AST): 21 U/L (ref 10–35)
BKR AST/ALT RATIO: 0.9
BKR BILIRUBIN TOTAL: 0.4 mg/dL (ref <=1.2)
BKR BLOOD UREA NITROGEN: 20 mg/dL (ref 8–23)
BKR BUN / CREAT RATIO: 19.6 (ref 8.0–23.0)
BKR CALCIUM: 8.8 mg/dL (ref 8.8–10.2)
BKR CHLORIDE: 107 mmol/L (ref 98–107)
BKR CO2: 22 mmol/L (ref 20–30)
BKR CREATININE DELTA: -0.12
BKR CREATININE: 1.02 mg/dL (ref 0.40–1.30)
BKR EGFR, CREATININE (CKD-EPI 2021): >60 mL/min/{1.73_m2} (ref >=60)
BKR GLOBULIN: 2.9 g/dL (ref 2.0–3.9)
BKR GLUCOSE: 91 mg/dL (ref 70–100)
BKR POTASSIUM: 3.8 mmol/L (ref 3.3–5.3)
BKR PROTEIN TOTAL: 6.4 g/dL (ref 5.9–8.3)
BKR SODIUM: 139 mmol/L (ref 136–144)

## 2023-01-29 LAB — CBC WITH AUTO DIFFERENTIAL
BKR WAM ABSOLUTE IMMATURE GRANULOCYTES.: 0.01 x 1000/ÂµL (ref 0.00–0.30)
BKR WAM ABSOLUTE LYMPHOCYTE COUNT.: 0.53 x 1000/ÂµL — ABNORMAL LOW (ref 0.60–3.70)
BKR WAM ABSOLUTE NRBC (2 DEC): 0 x 1000/ÂµL (ref 0.00–1.00)
BKR WAM ANC (ABSOLUTE NEUTROPHIL COUNT): 2.07 x 1000/ÂµL (ref 2.00–7.60)
BKR WAM BASOPHIL ABSOLUTE COUNT.: 0.03 x 1000/ÂµL (ref 0.00–1.00)
BKR WAM BASOPHILS: 0.9 % (ref 0.0–1.4)
BKR WAM EOSINOPHIL ABSOLUTE COUNT.: 0.04 x 1000/ÂµL (ref 0.00–1.00)
BKR WAM EOSINOPHILS: 1.3 % (ref 0.0–5.0)
BKR WAM HEMATOCRIT (2 DEC): 32.3 % — ABNORMAL LOW (ref 38.50–50.00)
BKR WAM HEMOGLOBIN: 11.4 g/dL — ABNORMAL LOW (ref 13.2–17.1)
BKR WAM IMMATURE GRANULOCYTES: 0.3 % (ref 0.0–1.0)
BKR WAM LYMPHOCYTES: 16.7 % — ABNORMAL LOW (ref 17.0–50.0)
BKR WAM MCH (PG): 34.8 pg — ABNORMAL HIGH (ref 27.0–33.0)
BKR WAM MCHC: 35.3 g/dL (ref 31.0–36.0)
BKR WAM MCV: 98.5 fL (ref 80.0–100.0)
BKR WAM MONOCYTE ABSOLUTE COUNT.: 0.49 x 1000/ÂµL (ref 0.00–1.00)
BKR WAM MONOCYTES: 15.5 % — ABNORMAL HIGH (ref 4.0–12.0)
BKR WAM MPV: 10.4 fL (ref 8.0–12.0)
BKR WAM NEUTROPHILS: 65.3 % (ref 39.0–72.0)
BKR WAM NUCLEATED RED BLOOD CELLS: 0 % (ref 0.0–1.0)
BKR WAM PLATELETS: 162 x1000/ÂµL (ref 150–420)
BKR WAM RDW-CV: 13.8 % (ref 11.0–15.0)
BKR WAM RED BLOOD CELL COUNT.: 3.28 M/ÂµL — ABNORMAL LOW (ref 4.00–6.00)
BKR WAM WHITE BLOOD CELL COUNT: 3.2 x1000/ÂµL — ABNORMAL LOW (ref 4.0–11.0)

## 2023-01-29 LAB — LACTATE DEHYDROGENASE: BKR LACTATE DEHYDROGENASE: 205 U/L (ref 122–241)

## 2023-01-29 LAB — HAPTOGLOBIN: BKR HAPTOGLOBIN: 162 mg/dL (ref 30–200)

## 2023-01-29 LAB — MAGNESIUM: BKR MAGNESIUM: 1.8 mg/dL (ref 1.7–2.4)

## 2023-01-29 LAB — RETICULOCYTES
BKR WAM IRF: 13.3 % (ref 3.0–15.9)
BKR WAM RETICULOCYTE - ABS (3 DEC): 0.032 10Ë6 cells/uL (ref 0.023–0.140)
BKR WAM RETICULOCYTE COUNT PCT (1 DEC): 1 % (ref 0.6–2.7)
BKR WAM RETICULOCYTE HGB EQUIVALENT: 39.2 pg — ABNORMAL HIGH (ref 28.2–35.7)

## 2023-01-29 LAB — TRIGLYCERIDES: BKR TRIGLYCERIDES: 173 mg/dL — ABNORMAL HIGH

## 2023-01-29 LAB — PHOSPHORUS     (BH GH L LMW YH): BKR PHOSPHORUS: 2.8 mg/dL (ref 2.2–4.5)

## 2023-01-29 NOTE — Progress Notes
 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 Sherrell Puller INTERIM HISTORY: Douglas [name] is days 113  post transplant. He had a bone marrow biopsy on 01/05/23, which was consistent with remission, no increase in blasts, and donor percentage is 100%; chromosome and FISH studies are pending. Overall, he is doing well. He is accompanied with wife. He has been eating and drinking well.  Denies cold, fever, or interim infection.He developed painful swelling in left big toe due to ingrown toe nail.  He has a history of plantar fascitis, for which he has been using orthotic for the last 2 weeks. He consulted ophthalmologist after last visit who reported everything was normal. Denies any vision changes today.  Will see him again in 6-8 weeks. Wife reports husband had an MRI, which was normal. Wife is applying ointment on husband back and legs. Denies any new rash today.  We reviewed the current medication list and made the necessary changes to chart. HPI:   [name] has a hx of Hypertension, hyperlipidemia, basal cell cancer of the skin, iron deficiency anemia.  The patient was evaluated for cytopenias in 2021 in [place].  Initial evaluation from [facility], source records are not available today.  Per the patient's subsequent records, he initially presented with leukopenia, thrombocytopenia, and monocytosis.  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  Initial bone marrow was performed on August 24, 2019, was reviewed at [facility] with findings of multilineage dysplasia.  The marrow was 40% cellular.  Megakaryocytes showed frequent dysplasia with  hyperchromatic forms and clustering.  Blasts were not increased.  A FISH panel for MDS  loci was negative.  Karyotype was normal. 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  The patient was observed and was without any treatment or transfusions.  In 2023, he had an acute drop in hemoglobin, was found to be iron deficient.  Workup for this did not show a clear focus of blood loss or bleeding.  The patient has continued evaluation and established care with Dr. Sherrell Puller in June 2023 as he spends some summers in [place].  Of note, there is a label of CLL in the patient's chart, which I suspect was a typo, and find no evidence that support that diagnosis.  Dr. Dema Severin assessment in June 2023 per the CPSS molecular score being considered intermediate one with a low risk of development of leukemia and median survival over 5 years.  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  The patient had a marrow performed at [facility] on 29th with CD34 stain showing 10% to 15% blasts focally more than 20%.  Marrow was hypercellular at 70%.  Dysplastic megakaryocytes again appreciated.  Molecular studies are pending.  Given the findings, treatment with the decitabine and venetoclax was recommended.  A referral has been placed for urgent EGD colonoscopy given the low iron concern for occult source of bleeding.He started treatment last week which he is tolerated well. He received 5-6 red blood cell transfusion in the past several month. He was suffering weakness and dyspnea which is now improved after transfusion. He does not have a significant infectious history, he did have  COVID-19 in the past from which he recovered. He has hypertension and hyperlipidemia managed by cardiologist in [place]. He reports a negative stress test in the past. He has had number of skin cancers and has had several MOHS procedures. He is followed with a urologist for some BPH and abnormal PSA. He has had 2 prostate biopsies which was negative. He carries a diagnosis of iron deficiency and require transfusion in [place] after a significant and acute drop in hemoglobin. There was no clinical evidence of bleeding. Endoscopy and colonoscopy are planned to exclude a source of bleeding. Douglas [name] is a 72 yo gentlemen that lives with is wife in [place].  He is currently spending the summer in  visiting family.  He has 2 daughters and 5 grandchildren.  Retired, having worked for [employer].  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  He is a social drinker of 1-2 beers per week, denies illicit drug use and is a non smoker.  Covid-19 vaccine series completed. 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)   CLL (chronic lymphocytic leukemia) (HC Code)   Depression   DOE (dyspnea on exertion)   Enlarged prostate   Essential hypertension   Family history of ischemic heart disease   Glaucoma   Hypertensive heart disease without heart failure   Mixed hyperlipidemia  Surgical HistoryPast Surgical History: Procedure Laterality Date  ADENOIDECTOMY    BONE MARROW BIOPSY    PROSTATE BIOPSY    TONSILLECTOMY    VASCULAR SURGERY   AllergiesAllergies Allergen Reactions  Voriconazole Other (See Comments)   Gingival edema/redness making it difficult to bite down  Current Outpatient Medications:   acyclovir (ZOVIRAX) 400 mg tablet, Take 1 tablet (400 mg total) by mouth every 12 (twelve) hours., Disp: 180 tablet, Rfl: 3 amLODIPine (NORVASC) 10 mg tablet, Take 1 tablet (10 mg total) by mouth., Disp: , Rfl:   amLODIPine (NORVASC) 10 mg tablet, Take 1 tablet (10 mg total) by mouth daily., Disp: , Rfl:   cephALEXin (KEFLEX) 500 mg capsule, Take 1 capsule (500 mg total) by mouth 4 (four) times daily for 7 days., Disp: 28 capsule, Rfl: 0  finasteride (PROSCAR) 5 mg tablet, TAKE 1 TABLET BY MOUTH DAILY FOR ENLARGED PROSTATE WITH URINATION PROBLEM TAKE WITH OR WITHOUT MEALS, Disp: , Rfl:   folic acid (FOLVITE) 1 mg tablet, Take 1 tablet (1 mg total) by mouth daily., Disp: 30 tablet, Rfl: 11  latanoprost (XALATAN) 0.005 % ophthalmic solution, Place 1 drop into both eyes nightly., Disp: , Rfl:   magnesium oxide 400 mg magnesium Cap, Take 400 mg by mouth daily., Disp: , Rfl:   Miscellaneous Medical Supply, Please provide rolling walker, Use as directed., Disp: 1 each, Rfl: 0  multivitamin tablet, Take 1 tablet by mouth daily., Disp: 30 tablet, Rfl: 11  ondansetron (ZOFRAN) 8 mg tablet, Take 1 tablet (8 mg total) by mouth every 8 (eight) hours as needed for nausea or vomiting. (Patient not taking: Reported on 12/16/2022), Disp: 30 tablet, Rfl: 2  pentamidine (PENTAM) 300 mg inhalation solution, Inhale 6 mLs (300 mg total) into the lungs every 28 days. Last given 01/15/23, Disp: , Rfl:   sodium chloride  0.9 % flush, Use 10 mLs by IV Push route daily. 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 (HC Code)   CLL (chronic lymphocytic leukemia) (HC Code)   Depression   DOE (dyspnea on exertion)   Enlarged prostate   Essential hypertension   Family history of ischemic heart disease   Glaucoma   Hypertensive heart disease without heart failure   Mixed hyperlipidemia  Family History Problem Relation Age of Onset  Breast cancer Mother   Liver cancer Mother   Glaucoma Father   Heart disease Father   Prostate cancer Father   Breast cancer Sister   Thyroid disease Sister   Hearing loss Sister   Diabetes Daughter   No Known Problems Daughter   No Known Problems Grandchild  Social History Socioeconomic History  Marital status: Married   Spouse name: Not on file  Number of children: Not on file  Years of education: Not on file  Highest education level: Not on file Occupational History  Not on file Tobacco Use  Smoking status: Never  Smokeless tobacco: Never Vaping Use  Vaping status: Never Used Substance and Sexual Activity  Alcohol use: Yes   Alcohol/week: 2.0 standard drinks of alcohol   Types: 2 Cans of beer per week   Comment: social drinker once a week  Drug use: Never  Sexual activity: Not on file Other Topics Concern  Not on file Social History Narrative  Not on file Social Drivers of Health Financial Resource Strain: Low Risk  (08/05/2021)  Overall Financial Resource Strain (CARDIA)   Difficulty of Paying Living Expenses: Not very hard Food Insecurity: No Food Insecurity (09/18/2022)  Hunger Vital Sign   Worried About Running Out of Food in the Last Year: Never true   Ran Out of Food in the Last Year: Never true Transportation Needs: No Transportation Needs (09/18/2022)  PRAPARE - Designer, jewellery (Medical): No   Lack of Transportation (Non-Medical): No Physical Activity: Not on file Stress: Not on file Social Connections: Not on file Intimate Partner Violence: Not on file Housing Stability: Low Risk  (09/18/2022)  Housing Stability   Housing Stability: I have a steady place to live   Housing Stability: Not on file EXAM: General Appearance:He is heavy set, appears wellHEENT:  Mucous membranes moist, No oral lesions, no erythema, teeth are adequate repair Lungs: ClearCor: RRR, no m/r/gAbd: soft nontender, no hepatosplenomegaly, Extr: trace edema.  Had a paronychia about the lateral aspect of left big toe with an ingrown toe nail. See picture below for more details. Skin:no rash  Lymph Nodes - no palpable adenopathy. Neurologic: mentation appears normal, normal speech, strength symmetric. LABSLab Results Component Value Date  WBC 3.7 (L) 01/15/2023  HGB 11.9 (L) 01/15/2023  HCT 34.50 (L) 01/15/2023  MCV 99.1 01/15/2023  PLT 166 01/15/2023 Comprehensive Metabolic Panel:Lab Results Component Value Date  GLU 100 01/15/2023  BUN 20 01/15/2023  CREATININE 1.20 01/15/2023  NA 140 01/15/2023  K 4.2 01/15/2023  CL 107 01/15/2023  CO2 22 01/15/2023  ALBUMIN 4.1 01/15/2023  PROT 6.7 01/15/2023  BILITOT 0.3 01/15/2023  ALKPHOS 86 01/15/2023  ALT 23 01/15/2023  GLOB 2.6 01/15/2023  CALCIUM 9.4  01/15/2023 Results in Past 7 DaysResult Component Current Result LD 206 (01/15/2023) ]A/P:  CORLISS HARVAN is 72 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  His disease is characterized by minor cytopenias over a long period of observation with recently worsened anemia, thrombocytopenia, and a marrow with increased blasts consistent with disease progression. 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 Therapy was   started with decitabine and venetoclax, and transplantation  recommended given the now increased risk of shorten survival. The patient has had a formal  search of the registry and a 10/10 match was identified.A/P: Day 113 no evidence of gvhd. 1) Disease - Bone marrow biopsy September 10th, this was consistent with remission by morphology. Donor percentage was 100%, FISH and gene panel are normal.  Bone marrow biopsy on 01/05/23 was consistent with remission, no increase in blasts, and donor percentage is 100%; chromosome and FISH studies are pending. 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 Goal is tac 5-10, check weekly ldh, haptoglobin, triglycerides. Plan to taper tacrolimus next week. 3) Heme - Donor, Recipient both blood type A+ve. Blood counts looks good today. 4) ID - Prophylaxis with acyclovir, pentamidine, and Cresemba. He is not at risk for CMV. 5) Hypertension - He is on continued Norvasc. 6) Liver: Continue Ursodiol, monitor mild LFT changes. Normal today. 7) History of skin cancer: Needs to be addressed after 3 month time point. Planned Mohs procedure on December 18th. 8) Abnormal spine, lesion on MRI pre- transplant: Repeated MRI on November 14th. The abnormal 3 cm area in L2 was stable and there was some subtle enhancement of the cauda equina nerve roots is noted on the sagittal postcontrast, of unclear significance enhancement and this was referred to less conspicuous. 9) Floaters: The patient was evaluated by Dr Franklyn Lor, ophthalmologist. Plan to follow him in again in 6-8 weeks. 10) Paronychia in left big toe: Referred to podiatrist for further treatment. We started Keflex 500 mg 4 times daily for 7 days. Follow up with next week. Dr. Chrystine Oiler MD Scribed for Chrystine Oiler, MD by Su Monks, medical scribe November 29, 2024The documentation recorded by the scribe accurately reflects the services I personally performed and the decisions made by me. I reviewed and confirmed all material entered and/or pre-charted by the scribe.

## 2023-01-29 NOTE — Progress Notes
 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 VSS. See focused reassessment. Douglas [name] reports feeling well.  Tacrolimus taper started 12/4. He took his tacrolimus, level not drawn today. Complete Blood CountResults in Past 7 DaysResult Component Current Result Hematocrit 32.30 (L) (01/29/2023) Hemoglobin 11.4 (L) (01/29/2023) MCH 34.8 (H) (01/29/2023) MCHC 35.3 (01/29/2023) MCV 98.5 (01/29/2023) MPV 10.4 (01/29/2023) Platelets 162 (01/29/2023) RBC 3.28 (L) (01/29/2023) WBC 3.2 (L) (01/29/2023) ANC = 2.07. He saw Dr Lenard Forth today. He returns to [facility] on 12/17. Will have hickman cath removed in IR then.

## 2023-01-30 LAB — CYTOMEGALOVIRUS QUANTITATIVE PCR, PLASMA     (BH GH L LMW YH): BKR CMV QUANTITATIVE PCR: NOT DETECTED

## 2023-02-02 ENCOUNTER — Encounter: Admit: 2023-02-02 | Payer: PRIVATE HEALTH INSURANCE

## 2023-02-02 ENCOUNTER — Inpatient Hospital Stay: Admit: 2023-02-02 | Discharge: 2023-02-02 | Payer: MEDICARE

## 2023-02-02 ENCOUNTER — Inpatient Hospital Stay: Admit: 2023-02-02 | Discharge: 2023-02-02 | Payer: MEDICARE | Attending: Diagnostic Radiology

## 2023-02-02 ENCOUNTER — Ambulatory Visit: Admit: 2023-02-02 | Payer: MEDICARE | Attending: Pharmacist Clinician (PhC)/ Clinical Pharmacy Specialist

## 2023-02-02 ENCOUNTER — Ambulatory Visit: Admit: 2023-02-02 | Payer: MEDICARE

## 2023-02-02 ENCOUNTER — Ambulatory Visit: Admit: 2023-02-02 | Payer: PRIVATE HEALTH INSURANCE

## 2023-02-02 ENCOUNTER — Ambulatory Visit: Admit: 2023-02-02 | Payer: MEDICARE | Attending: Medical Oncology

## 2023-02-02 VITALS — BP 156/81 | HR 85 | Temp 98.10000°F | Resp 20 | Wt 203.3 lb

## 2023-02-02 DIAGNOSIS — E782 Mixed hyperlipidemia: Secondary | ICD-10-CM

## 2023-02-02 DIAGNOSIS — C931 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: Secondary | ICD-10-CM

## 2023-02-02 DIAGNOSIS — Z800 Family history of malignant neoplasm of digestive organs: Secondary | ICD-10-CM

## 2023-02-02 DIAGNOSIS — I119 Hypertensive heart disease without heart failure: Secondary | ICD-10-CM

## 2023-02-02 DIAGNOSIS — F419 Anxiety disorder, unspecified: Secondary | ICD-10-CM

## 2023-02-02 DIAGNOSIS — Z9481 Bone marrow transplant status: Secondary | ICD-10-CM

## 2023-02-02 DIAGNOSIS — Z79899 Other long term (current) drug therapy: Secondary | ICD-10-CM

## 2023-02-02 DIAGNOSIS — N400 Enlarged prostate without lower urinary tract symptoms: Secondary | ICD-10-CM

## 2023-02-02 DIAGNOSIS — C4491 Basal cell carcinoma of skin, unspecified: Secondary | ICD-10-CM

## 2023-02-02 DIAGNOSIS — Z452 Encounter for adjustment and management of vascular access device: Secondary | ICD-10-CM

## 2023-02-02 DIAGNOSIS — C9201 Acute myeloblastic leukemia, in remission: Secondary | ICD-10-CM

## 2023-02-02 DIAGNOSIS — C9200 Acute myeloblastic leukemia, not having achieved remission: Secondary | ICD-10-CM

## 2023-02-02 DIAGNOSIS — I10 Essential (primary) hypertension: Secondary | ICD-10-CM

## 2023-02-02 DIAGNOSIS — H409 Unspecified glaucoma: Secondary | ICD-10-CM

## 2023-02-02 DIAGNOSIS — Z9484 Stem cells transplant status: Secondary | ICD-10-CM

## 2023-02-02 DIAGNOSIS — Z803 Family history of malignant neoplasm of breast: Secondary | ICD-10-CM

## 2023-02-02 DIAGNOSIS — Z856 Personal history of leukemia: Secondary | ICD-10-CM

## 2023-02-02 DIAGNOSIS — C9110 Chronic lymphocytic leukemia of B-cell type not having achieved remission: Secondary | ICD-10-CM

## 2023-02-02 DIAGNOSIS — R0609 Other forms of dyspnea: Secondary | ICD-10-CM

## 2023-02-02 DIAGNOSIS — F32A Depression: Secondary | ICD-10-CM

## 2023-02-02 DIAGNOSIS — Z883 Allergy status to other anti-infective agents status: Secondary | ICD-10-CM

## 2023-02-02 DIAGNOSIS — Z8249 Family history of ischemic heart disease and other diseases of the circulatory system: Secondary | ICD-10-CM

## 2023-02-02 LAB — PT/INR AND PTT (BH GH L LMW YH)
BKR INR: 0.99 (ref 0.86–1.13)
BKR PARTIAL THROMBOPLASTIN TIME: 24.4 s (ref 23.0–31.0)
BKR PROTHROMBIN TIME: 10.9 s (ref 9.6–12.3)

## 2023-02-02 LAB — COMPREHENSIVE METABOLIC PANEL
BKR A/G RATIO: 1.6 (ref 1.0–2.2)
BKR ALANINE AMINOTRANSFERASE (ALT): 24 U/L (ref 9–59)
BKR ALBUMIN: 4 g/dL (ref 3.6–5.1)
BKR ALKALINE PHOSPHATASE: 79 U/L (ref 9–122)
BKR ANION GAP: 11 (ref 7–17)
BKR ASPARTATE AMINOTRANSFERASE (AST): 20 U/L (ref 10–35)
BKR AST/ALT RATIO: 0.8
BKR BILIRUBIN TOTAL: 0.3 mg/dL (ref <=1.2)
BKR BLOOD UREA NITROGEN: 21 mg/dL (ref 8–23)
BKR BUN / CREAT RATIO: 22.8 (ref 8.0–23.0)
BKR CALCIUM: 9 mg/dL (ref 8.8–10.2)
BKR CHLORIDE: 108 mmol/L — ABNORMAL HIGH (ref 98–107)
BKR CO2: 22 mmol/L (ref 20–30)
BKR CREATININE DELTA: -0.1
BKR CREATININE: 0.92 mg/dL (ref 0.40–1.30)
BKR EGFR, CREATININE (CKD-EPI 2021): >60 mL/min/{1.73_m2} (ref >=60)
BKR GLOBULIN: 2.5 g/dL (ref 2.0–3.9)
BKR GLUCOSE: 94 mg/dL (ref 70–100)
BKR POTASSIUM: 4 mmol/L (ref 3.3–5.3)
BKR PROTEIN TOTAL: 6.5 g/dL (ref 5.9–8.3)
BKR SODIUM: 141 mmol/L (ref 136–144)

## 2023-02-02 LAB — CBC WITH AUTO DIFFERENTIAL
BKR WAM ABSOLUTE IMMATURE GRANULOCYTES.: 0.02 x 1000/ÂµL (ref 0.00–0.30)
BKR WAM ABSOLUTE LYMPHOCYTE COUNT.: 0.57 x 1000/ÂµL — ABNORMAL LOW (ref 0.60–3.70)
BKR WAM ABSOLUTE NRBC (2 DEC): 0 x 1000/ÂµL (ref 0.00–1.00)
BKR WAM ANC (ABSOLUTE NEUTROPHIL COUNT): 2.47 x 1000/ÂµL (ref 2.00–7.60)
BKR WAM BASOPHIL ABSOLUTE COUNT.: 0.03 x 1000/ÂµL (ref 0.00–1.00)
BKR WAM BASOPHILS: 0.8 % (ref 0.0–1.4)
BKR WAM EOSINOPHIL ABSOLUTE COUNT.: 0.06 x 1000/ÂµL (ref 0.00–1.00)
BKR WAM EOSINOPHILS: 1.6 % (ref 0.0–5.0)
BKR WAM HEMATOCRIT (2 DEC): 33.6 % — ABNORMAL LOW (ref 38.50–50.00)
BKR WAM HEMOGLOBIN: 11.4 g/dL — ABNORMAL LOW (ref 13.2–17.1)
BKR WAM IMMATURE GRANULOCYTES: 0.5 % (ref 0.0–1.0)
BKR WAM LYMPHOCYTES: 15.6 % — ABNORMAL LOW (ref 17.0–50.0)
BKR WAM MCH (PG): 33.7 pg — ABNORMAL HIGH (ref 27.0–33.0)
BKR WAM MCHC: 33.9 g/dL (ref 31.0–36.0)
BKR WAM MCV: 99.4 fL (ref 80.0–100.0)
BKR WAM MONOCYTE ABSOLUTE COUNT.: 0.5 x 1000/ÂµL (ref 0.00–1.00)
BKR WAM MONOCYTES: 13.7 % — ABNORMAL HIGH (ref 4.0–12.0)
BKR WAM MPV: 10.2 fL (ref 8.0–12.0)
BKR WAM NEUTROPHILS: 67.8 % (ref 39.0–72.0)
BKR WAM NUCLEATED RED BLOOD CELLS: 0 % (ref 0.0–1.0)
BKR WAM PLATELETS: 157 x1000/ÂµL (ref 150–420)
BKR WAM RDW-CV: 14.3 % (ref 11.0–15.0)
BKR WAM RED BLOOD CELL COUNT.: 3.38 M/ÂµL — ABNORMAL LOW (ref 4.00–6.00)
BKR WAM WHITE BLOOD CELL COUNT: 3.7 x1000/ÂµL — ABNORMAL LOW (ref 4.0–11.0)

## 2023-02-02 LAB — PHOSPHORUS     (BH GH L LMW YH): BKR PHOSPHORUS: 2.7 mg/dL (ref 2.2–4.5)

## 2023-02-02 LAB — RETICULOCYTES
BKR WAM IRF: 9.9 % (ref 3.0–15.9)
BKR WAM RETICULOCYTE - ABS (3 DEC): 0.043 10Ë6 cells/uL (ref 0.023–0.140)
BKR WAM RETICULOCYTE COUNT PCT (1 DEC): 1.3 % (ref 0.6–2.7)
BKR WAM RETICULOCYTE HGB EQUIVALENT: 36.7 pg — ABNORMAL HIGH (ref 28.2–35.7)

## 2023-02-02 LAB — HAPTOGLOBIN: BKR HAPTOGLOBIN: 165 mg/dL (ref 30–200)

## 2023-02-02 LAB — TRIGLYCERIDES: BKR TRIGLYCERIDES: 170 mg/dL — ABNORMAL HIGH

## 2023-02-02 LAB — MAGNESIUM: BKR MAGNESIUM: 1.8 mg/dL (ref 1.7–2.4)

## 2023-02-02 LAB — TACROLIMUS LEVEL     (BH GH L LMW YH): BKR TACROLIMUS BLOOD: 3 ng/mL — ABNORMAL LOW

## 2023-02-02 LAB — LACTATE DEHYDROGENASE: BKR LACTATE DEHYDROGENASE: 191 U/L (ref 122–241)

## 2023-02-02 NOTE — Other
 Post-HSCT Follow-Up Pharmacist note Diagnosis: CMMLConditioning:   Fludarabine/Melphalan with post-transplant Cyclophosphamide Transplant Type:10/10 MUD Allogeneic PBSCTTransplant Date (Day 0): 09/24/22 Referring Provider: Chrystine Oiler, MDEncounter Date: 02/02/2023 = D+131[name] is a 72 y.o. year-old male with history of CMML s/p HSCT. Patient is being seen by the clinic pharmacist for medication management included post stem cell transplant, hematological related disorders and GI related disorders .  This visit was completed as a joint-visit with provider present and is aware of the care plan.Subjective: No signs and symptoms of GvHD during taperAssessment and Recommendations:  Summary of Medication Changes and Follow-up Items Discussed with Provider:Will be seen in two weeks on the heme infusion clinic where he will get pentamidineWill need to reassess when magnesium can be discontinuedGVHD (immunosuppression)Tacrolimus: Current dose: taper schedule	12/17 - 12/23: 3 mg po twice daily	12/24 - 12/30: 2 mg po twice daily	12/31 - 1/6: 1 mg po twice daily	1/7 - 1/13: 1 mg po daily	1/14 - 1/20: 0.5 mg po dailyDosage dispensed: 0.5mg , 1mg , 5mg  capsulesPharmacy dispensed: OPSPost transplant cyclophosphamide: completed inpatient *All immunosuppression is managed by the bone marrow transplant team.Infectious Disease (prophylaxis, monitoring, and active infections)ViralCurrent prophylaxis/treatment: Acyclovir 400mg  PO twice dailyPrevious viral infections: 	HSV/VZV +/?CMV -/?, 12/13 CMV negativeEBV IgG negativeBK virus N/APneumocystis jeuvecii pneumoniaCurrent prophylaxis/treatment: Pentamidine 300mg  inhaled monthly last given 11/29/24Previous PJP infection: N/AT-cell subsetDay +100 (12/22/22): 112Day +180 (date):1 year (date):Toxoplasmosis IgG negativeCurrent prophylaxis/treatment: NoneFungal/MoldCurrent prophylaxis/treatment: Isavuconazole 372mg  PO daily - history of prolonged cytopenias, d/c 11/13/24Previous fungal/mold infection: N/A, Hx of gingival changes to voriconazole Drug monitoring: N/AGalactomann - 10/01/22 negFungitell - 10/01/22 < 31Bacterial	Current prophylaxis/treatment: Keflex 500 mg po every 6 hours x 7 days	Previous bacterial infection: N/A	Drug monitoring: N/A	Blood cultures: N/ASupportive careHyperlipidemia monitoring (triglycerides, Cholesterol, LDL, HDL)Current treatment: None	Prior treatment: Atorvastatin 40mg  PO daily, held with transplantBaseline (09/18/2022): 68 Today (02/02/23): 170Hypertension monitoring (blood pressure)Current treatment: Amlodipine 10mg  PO dailyPrior treatment: Metoprolol and lisinopril on hold since transplant Today (02/02/23):  156/81GastrointestionalStress ulcer prophylaxis: None, one episode of acid refluxNausea/Vomiting: Intermittent nausea, takes ondansetron with good effect. Doesn't find that prochlorperazine has much benefit.Diarrhea/constipation: Continues to have soft stools, loperamidine prnOral care and mucositis: NoneOther: Discussed using plastic utensils for meals at home given ongoing metallic taste in the mouth that affects appetite. Veno-occlusive diseaseCurrent treatment:  Ursodiol 500mg  PO twice daily until day +90, d/c 11/5/24Anticoagulation	Current prophylaxis/treatment: None	Previous anticoagulation history: Aspirin 81mg  PO daily, for cardiac ppx - not restartingElectrolyte supplementation:	Current treatment: Magnesium oxide 400mg  BID	Monitoring: Mg 1.8 12/17/24Hypogammaglobulinemia	Current treatment:	Goal: 500-800 ng/dLIVIG last dose:Baseline IgG level:Last IgG level:IgG scheduled levelsDay +100 (12/22/22): 856Day +180 (date):	1 year (date):	Maintenance Therapy Plan: VaccinationsBaseline titers: 	Hepatitis B: 	Pneumococcal: 	VZV 	Measles: , Mumps: , Rubella: 12 month: 14 month: 18 month:	Hep B titer	Pneumococcal titers24 month:Titers drawn 2 months after 24 months:Redose needed:Medication History:Douglas [name] presents to today's visit with their med action plan.  A medication history was obtained from the patient and spouse.Adherence:The patient reports 0 missed doses of medications in the past 1 week(s). The patient does not report the barriers to adherence. Discussed potential outcomes associated with non-adherenceAffordability:The patient denies concerns with affordability of medications at this time. Medication Management:Plan for medication management assistance: An updated medication action plan was not given to the patient, no changesPatient would benefit from continued pharmacy follow-up at future clinic visits for medication managementEducated patient on the above changesPatient did not request any refills todayObjective: EPP:IRJJOA Results (from the past week) CBC auto differential  Collection Time: 02/02/23 11:36 AM Result Value Ref Range  WBC 3.7 (L) 4.0 - 11.0 x1000/?L  RBC 3.38 (L) 4.00 -  6.00 M/?L  Hemoglobin 11.4 (L) 13.2 - 17.1 g/dL  Hematocrit 74.25 (L) 95.63 - 50.00 %  MCV 99.4 80.0 - 100.0 fL  MCH 33.7 (H) 27.0 - 33.0 pg  MCHC 33.9 31.0 - 36.0 g/dL  RDW-CV 87.5 64.3 - 32.9 %  Platelets 157 150 - 420 x1000/?L  MPV 10.2 8.0 - 12.0 fL  Neutrophils 67.8 39.0 - 72.0 %  Lymphocytes 15.6 (L) 17.0 - 50.0 %  Monocytes 13.7 (H) 4.0 - 12.0 %  Eosinophils 1.6 0.0 - 5.0 %  Basophil 0.8 0.0 - 1.4 %  Immature Granulocytes 0.5 0.0 - 1.0 %  nRBC 0.0 0.0 - 1.0 %  Absolute Lymphocyte Count 0.57 (L) 0.60 - 3.70 x 1000/?L  Monocyte Absolute Count 0.50 0.00 - 1.00 x 1000/?L  Eosinophil Absolute Count 0.06 0.00 - 1.00 x 1000/?L  Basophil Absolute Count 0.03 0.00 - 1.00 x 1000/?L  Absolute Immature Granulocyte Count 0.02 0.00 - 0.30 x 1000/?L Absolute nRBC 0.00 0.00 - 1.00 x 1000/?L  ANC (Abs Neutrophil Count) 2.47 2.00 - 7.60 x 1000/?L JJO:ACZYSA Results (from the past week) Comprehensive metabolic panel  Collection Time: 02/02/23 11:36 AM Result Value Ref Range  Sodium 141 136 - 144 mmol/L  Potassium 4.0 3.3 - 5.3 mmol/L  Chloride 108 (H) 98 - 107 mmol/L  CO2 22 20 - 30 mmol/L  Anion Gap 11 7 - 17  Glucose 94 70 - 100 mg/dL  BUN 21 8 - 23 mg/dL  Creatinine 6.30 1.60 - 1.30 mg/dL  Calcium 9.0 8.8 - 10.9 mg/dL  BUN/Creatinine Ratio 32.3 8.0 - 23.0  Total Protein 6.5 5.9 - 8.3 g/dL  Albumin 4.0 3.6 - 5.1 g/dL  Total Bilirubin 0.3 <=5.5 mg/dL  Alkaline Phosphatase 79 9 - 122 U/L  Alanine Aminotransferase (ALT) 24 9 - 59 U/L  Aspartate Aminotransferase (AST) 20 10 - 35 U/L  Globulin 2.5 2.0 - 3.9 g/dL  A/G Ratio 1.6 1.0 - 2.2  AST/ALT Ratio 0.8 Reference Range Not Established  eGFR (Creatinine) >60 >=60 mL/min/1.61m2  Creatinine Delta -0.1 See Comment LDH: Recent Results (from the past week) Lactate dehydrogenase  Collection Time: 02/02/23 11:36 AM Result Value Ref Range  LD 191 122 - 241 U/L Patient Education: The following education was provided for: Initiation, modification, or discontinuation of drug therapy, Assessment of efficacy and safety of drug therapy, and Adverse effect presentation, evaluation, and monitoring during today?s visit to the patient and spouse regarding medication management: purpose of each medication, side effects of medications, and importance of adherence with the regimen(s) Time Spent: 15 minutes, face-to-face consultPATIENT AND/OR CAREGIVER VERBALIZED UNDERSTANDING OF CARE PLAN: yesPATIENT ADVISED TO CALL BACK WITH QUESTIONS, CONCERNS, OR CHANGE IN SYMPTOMS.Jorene Guest, PharmD BCPS BCOP12/17/24

## 2023-02-02 NOTE — Progress Notes
 Day + 131 allo. Labs drawn prior to hickman removal. See focused reassessment. He saw Tenny Craw app today. He returns to [facility] on 1/2, will have blood drawn see APP and receive Pentamidine in [facility].

## 2023-02-02 NOTE — Discharge Instructions
 [facility]	 [facility] REMOVAL DISCHARGE INSTRUCTIONSSite Care:1.  Remove dressing after 72 hours (3 days).2.  Keep wound clean and dry.3.  May shower after 72 hours (3 days).4.  No swimming, hot tubs or tub bath for 10-14 days.Activity:For the next 24 hours do not perform strenuous activity.Diet:Resume diet prior to procedure as tolerated.Medications:Resume all previous medications without change.When to Call Unusual pain, swelling or redness at siteBleedingShaking chillsChest painShortness of breathFever over 101*FDrainage around the site   Contact us:For any questions or concerns any time please call [phone].SHOULD THERE BE AN EMERGENCY-CALL 911 FOR IMMEDIATE PARAMEDIC RESPONSE OR GO DIRECTLY TO THE NEAREST EMERGENCY ROOM

## 2023-02-03 ENCOUNTER — Ambulatory Visit: Admit: 2023-02-03 | Payer: MEDICARE

## 2023-02-03 LAB — CYTOMEGALOVIRUS QUANTITATIVE PCR, PLASMA     (BH GH L LMW YH): BKR CMV QUANTITATIVE PCR: NOT DETECTED

## 2023-02-03 NOTE — Telephone Encounter
 Douglas [name], from [facility], called to follow up on the order that was faxed in for signature on 12/6. She wanted to check on the status. Please advise.Phone number:[phone]:[phone]

## 2023-02-09 ENCOUNTER — Encounter: Admit: 2023-02-09 | Payer: PRIVATE HEALTH INSURANCE | Attending: Adult Health

## 2023-02-09 DIAGNOSIS — N400 Enlarged prostate without lower urinary tract symptoms: Secondary | ICD-10-CM

## 2023-02-09 DIAGNOSIS — R0609 Other forms of dyspnea: Secondary | ICD-10-CM

## 2023-02-09 DIAGNOSIS — E782 Mixed hyperlipidemia: Secondary | ICD-10-CM

## 2023-02-09 DIAGNOSIS — H409 Unspecified glaucoma: Secondary | ICD-10-CM

## 2023-02-09 DIAGNOSIS — F419 Anxiety disorder, unspecified: Secondary | ICD-10-CM

## 2023-02-09 DIAGNOSIS — C931 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: Secondary | ICD-10-CM

## 2023-02-09 DIAGNOSIS — C4491 Basal cell carcinoma of skin, unspecified: Secondary | ICD-10-CM

## 2023-02-09 DIAGNOSIS — I10 Essential (primary) hypertension: Secondary | ICD-10-CM

## 2023-02-09 DIAGNOSIS — C9110 Chronic lymphocytic leukemia of B-cell type not having achieved remission: Secondary | ICD-10-CM

## 2023-02-09 DIAGNOSIS — F32A Depression: Secondary | ICD-10-CM

## 2023-02-09 DIAGNOSIS — Z8249 Family history of ischemic heart disease and other diseases of the circulatory system: Secondary | ICD-10-CM

## 2023-02-09 DIAGNOSIS — I119 Hypertensive heart disease without heart failure: Secondary | ICD-10-CM

## 2023-02-09 NOTE — Progress Notes
 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 He overall reports feeling well. He started a Tac taper on 12/4 based on instructions given in clinic. He is currently taking 4 mg BID. He has not had any fevers, chills or cold symptoms. No chest pain, palpitations, shortness of breath or cough. No nausea or vomiting. Eating and drinking ok. No diarrhea. No rash. He is scheduled to have his Hickman out today and he is anxious to get back home when the doctor says its ok. Review of Systems Constitutional: Negative.  Negative for chills, fever and weight loss. Eyes: Negative.  Respiratory: Negative.   Cardiovascular: Negative.  Negative for chest pain and palpitations. Gastrointestinal:  Negative for abdominal pain, constipation, diarrhea, nausea and vomiting. Musculoskeletal:  Negative for falls. Skin: Negative.  Negative for itching and rash. Neurological:  Positive for tremors (Shakiness of hands, improved). Negative for dizziness and headaches. Psychiatric/Behavioral: Negative.   VoriconazoleCurrent Outpatient Medications on File Prior to Visit Medication Sig Dispense Refill  acyclovir (ZOVIRAX) 400 mg tablet Take 1 tablet (400 mg total) by mouth every 12 (twelve) hours. 180 tablet 3  amLODIPine (NORVASC) 10 mg tablet Take 1 tablet (10 mg total) by mouth daily.    finasteride (PROSCAR) 5 mg tablet TAKE 1 TABLET BY MOUTH DAILY FOR ENLARGED PROSTATE WITH URINATION PROBLEM TAKE WITH OR WITHOUT MEALS    folic acid (FOLVITE) 1 mg tablet Take 1 tablet (1 mg total) by mouth daily. 30 tablet 11 isavuconazonium (CRESEMBA) 186 mg capsule Take 2 capsules (372 mg total) by mouth daily. 56 capsule 11  latanoprost (XALATAN) 0.005 % ophthalmic solution Place 1 drop into both eyes nightly.    magnesium oxide 400 mg magnesium Cap Take 400 mg by mouth daily.    Miscellaneous Medical Supply Please provide rolling walker, Use as directed. 1 each 0  multivitamin tablet Take 1 tablet by mouth daily. 30 tablet 11  ondansetron (ZOFRAN) 8 mg tablet Take 1 tablet (8 mg total) by mouth every 8 (eight) hours as needed for nausea or vomiting. (Patient not taking: Reported on 12/01/2022) 30 tablet 2  pentamidine (PENTAM) 300 mg inhalation solution Inhale 6 mLs (300 mg total) into the lungs every 28 days. Last given 12/16/22    sodium chloride 0.9 % flush Use 10 mLs by IV Push route daily. In both lumens 600 mL 11  tacrolimus (PROGRAF) 5 mg Immediate Release capsule Take 1 capsule (5 mg total) by mouth 2 (two) times daily. (Patient taking differently: Take 1 capsule (5 mg total) by mouth 2 (two) times daily. 5 mg bid) 60 capsule 11  ursodioL (URSO FORTE) 500 mg tablet Take 1 tablet (500 mg total) by mouth 2 times daily (9am, 5pm). 60 tablet 6  [DISCONTINUED] venetoclax (VENCLEXTA) 100 mg tablet Take 4 tablets (400 mg total) by mouth once daily. Take with food. Swallow whole, do not crush. 120 tablet 0 Current Facility-Administered Medications on File Prior to Visit Medication Dose Route Frequency Provider Last Rate Last Admin  [COMPLETED] albuterol sulfate 90 mcg/actuation HFA aerosol inhaler 2 puff  2 puff Inhalation Once Pugliese, Amy Barile, APRN   2 puff at 12/16/22 1041  [COMPLETED] pentamidine 300 mg INHALATION solution  300 mg Nebulization Once [name], APRN  300 mg at 12/16/22 1043 BP: 134/80 (12/16/2022 10:27 AM), Pulse: 85 (12/16/2022 10:27 AM), Temp: 97.9 ?F (36.6 ?C) (12/16/2022 10:27 AM), Resp: 16 (12/16/2022 10:27 AM), SpO2: 100 % (12/16/2022 10:27 AM), Pain Score: Zero (12/16/2022 10:23 AM), Weight: 90.8 kg (12/16/2022 10:23 AM), Weight (lbs): 200.18 (12/16/2022 10:23 AM)Physical ExamConstitutional:     Appearance: Normal appearance. He is not ill-appearing. HENT:    Head: Normocephalic.    Nose: Nose normal. No congestion.    Mouth/Throat:    Mouth: Mucous membranes are moist.    Pharynx: Oropharynx is clear. Eyes:    General: No scleral icterus.      Right eye: No discharge.       Left eye: No discharge. Cardiovascular:    Rate and Rhythm: Normal rate and regular rhythm.    Pulses: Normal pulses.    Heart sounds: Normal heart sounds. No murmur heard.Pulmonary:    Effort: Pulmonary effort is normal. No respiratory distress.    Breath sounds: Normal breath sounds. No wheezing or rales. Abdominal:    General: Abdomen is flat. Bowel sounds are normal. There is no distension.    Palpations: Abdomen is soft.    Tenderness: There is no abdominal tenderness. There is no guarding. Musculoskeletal:       General: No swelling.    Right lower leg: No edema.    Left lower leg: No edema. Skin:   General: Skin is warm.    Findings: No erythema or rash.    Comments: Paronychia to left great toe Neurological:    Mental Status: He is alert and oriented to person, place, and time. Psychiatric:       Behavior: Behavior normal. Results for orders placed or performed during the hospital encounter of 02/02/23 Phosphorus     (BH [facility] L LMW Y[facility])  Collection Time: 02/02/23 11:36 AM Result Value Ref Range  Phosphorus 2.7 2.2 - 4.5 mg/dL Magnesium  Collection Time: 02/02/23 11:36 AM Result Value Ref Range  Magnesium 1.8 1.7 - 2.4 mg/dL Cytomegalovirus quantitative PCR, plasma     ([facility] L LMW Y[facility])  Collection Time: 02/02/23 11:36 AM Result Value Ref Range  Cytomegalovirus Quantitative PCR Not Detected Not Detected PT/INR and PTT ([facility] L LMW Y[facility])  Collection Time: 02/02/23 11:36 AM Result Value Ref Range  Prothrombin Time 10.9 9.6 - 12.3 seconds  INR 0.99 0.86 - 1.13  PTT 24.4 23.0 - 31.0 seconds Triglycerides  Collection Time: 02/02/23 11:36 AM Result Value Ref Range  Triglycerides 170 (H) See Comment mg/dL Haptoglobin  Collection Time: 02/02/23 11:36 AM Result Value Ref Range  Haptoglobin 165 30 - 200 mg/dL Tacrolimus level     (BH GH L LMW Y[facility])  Collection Time: 02/02/23 11:36 AM Result Value Ref Range  Tacrolimus Lvl 3.0 (L) See Comment ng/mL Lactate dehydrogenase  Collection Time: 02/02/23 11:36 AM Result Value Ref Range  LD 191 122 - 241 U/L Reticulocytes  Collection Time: 02/02/23 11:36 AM Result Value Ref Range  Immature Reticulocyte Fraction 9.9 3.0 - 15.9 %  Reticulocyte Hgb Equivalent 36.7 (H) 28.2 - 35.7 pg  Percent Reticulocyte 1.3 0.6 - 2.7 %  Absolute Reticulocyte 0.043 0.023 - 0.140 10?6 cells/uL Comprehensive metabolic panel  Collection Time: 02/02/23 11:36 AM Result Value Ref Range  Sodium 141 136 - 144 mmol/L  Potassium 4.0 3.3 - 5.3 mmol/L  Chloride 108 (H) 98 - 107 mmol/L  CO2 22 20 - 30 mmol/L  Anion Gap 11 7 - 17  Glucose 94 70 -  100 mg/dL  BUN 21 8 - 23 mg/dL  Creatinine 8.41 3.24 - 1.30 mg/dL  Calcium 9.0 8.8 - 40.1 mg/dL  BUN/Creatinine Ratio 02.7 8.0 - 23.0  Total Protein 6.5 5.9 - 8.3 g/dL  Albumin 4.0 3.6 - 5.1 g/dL  Total Bilirubin 0.3 <=2.5 mg/dL  Alkaline Phosphatase 79 9 - 122 U/L  Alanine Aminotransferase (ALT) 24 9 - 59 U/L  Aspartate Aminotransferase (AST) 20 10 - 35 U/L  Globulin 2.5 2.0 - 3.9 g/dL  A/G Ratio 1.6 1.0 - 2.2  AST/ALT Ratio 0.8 Reference Range Not Established  eGFR (Creatinine) >60 >=60 mL/min/1.15m2  Creatinine Delta -0.1 See Comment CBC auto differential  Collection Time: 02/02/23 11:36 AM Result Value Ref Range  WBC 3.7 (L) 4.0 - 11.0 x1000/?L  RBC 3.38 (L) 4.00 - 6.00 M/?L  Hemoglobin 11.4 (L) 13.2 - 17.1 g/dL  Hematocrit 36.64 (L) 40.34 - 50.00 %  MCV 99.4 80.0 - 100.0 fL  MCH 33.7 (H) 27.0 - 33.0 pg  MCHC 33.9 31.0 - 36.0 g/dL  RDW-CV 74.2 59.5 - 63.8 %  Platelets 157 150 - 420 x1000/?L  MPV 10.2 8.0 - 12.0 fL  Neutrophils 67.8 39.0 - 72.0 %  Lymphocytes 15.6 (L) 17.0 - 50.0 %  Monocytes 13.7 (H) 4.0 - 12.0 %  Eosinophils 1.6 0.0 - 5.0 %  Basophil 0.8 0.0 - 1.4 %  Immature Granulocytes 0.5 0.0 - 1.0 %  nRBC 0.0 0.0 - 1.0 %  Absolute Lymphocyte Count 0.57 (L) 0.60 - 3.70 x 1000/?L  Monocyte Absolute Count 0.50 0.00 - 1.00 x 1000/?L  Eosinophil Absolute Count 0.06 0.00 - 1.00 x 1000/?L  Basophil Absolute Count 0.03 0.00 - 1.00 x 1000/?L  Absolute Immature Granulocyte Count 0.02 0.00 - 0.30 x 1000/?L  Absolute nRBC 0.00 0.00 - 1.00 x 1000/?L  ANC (Abs Neutrophil Count) 2.47 2.00 - 7.60 x 1000/?L Type and screen  Is this a pre-operative order? No  Collection Time: 02/02/23 11:36 AM Result Value Ref Range  ABO Grouping A   Rh Type POS   Antibody Screen NEG   Specimen Expiration Date And Time 02/05/2023 23:59   Assessment:Douglas [name] is a 72 y.o. 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 He is s/p 2 cycles of chemotherapy with Decitabine + Venetoclax with reduction in blast count. 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 He is engrafted on day +13 and is discharged on 10/10/22. Plan: Day +131  No s/sx of GVHD. Started Tac taper previously, currently on 4 mg BID. Given calendar for remainder of taper over the next 4 weeks and instructed on s/s of GVHD and to call with any concerns. 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 -Day 100 11/19 (clinic performed),NED 100% donor cytogenetics pendingNeeds BM,180 and 365Heme: -Transfuse to maintain Hgb > 7 and plts >/=20 or if clinically indicatedInfectious Disease:- Prophylactic Antimicrobials of Acyclovir and  Pentam Inhalation given last on 01/15/23 - Identified as CMV Risk: check periodically - not detected on 01/15/23- Paronychia left great toe completed Kelfex with improvement, waiting Podiatry appt.GVHD Prophylaxis: -Post Transplant Cy -Tacrolimus Taper started on 12/4, currently taking 4 mg  BID. Given calendar for taper over the next 4 weeks. Reviewed s/s of GVHD and when to call -No MMF with 10/10-Monitor Triglycerides, Haptoglobin and LDH weekly on TacrolimusCV:  history of HTN-Norvasc to 10 mg po dailyFluids, Electrolytes and Nutrition:-Replete electrolytes PRN-Diet - safe food handling-Daily MTV, Folic Acid and MagnesiumMSK:  -Right shoulder pain, full ROM with pain. Larey Seat on 11/08/22). Supportive care - pre transplant MRI lumbar spine 11/14:Stable appearing previously seen geographic 3 cm L2 vertebral body, without associated pathologic fracture or epidural soft tissue mass. Post Transplant immune reconstitution/vaccines: Date   Day   T cell subset   IGG 11/5  ~100 112 866   ~180     ~1 year   Plan on re-vaccinating at 1 year unless remain on immunosuppression. Vaccines : 12, 14 , 16 and 24 months s/p transplantHealth Maintenance Day +30 Day +100 Day +180 1 Year Heme/Immune       T cell subsets/IGG       Chimerism/Bone Marrow or PET/Imaging [x]  [x] [x]  [] []  [] []  Pulmonary       PFT's  []  []  []  Cardiology       Echo       EKG   []  Only if cardiac risk factors  [] []  [] []  Endocrine       TSH/TFT's       Fasting Lipids       Glucose/A1c       Vit D/Calcium        Bone Density (women and those on steroids)Reproductive Endocrine        FSH/LH/Testosterone (both men & women)   []  [] [] [] [] [] [] []  Liver      Ferritin      MRI T2* quant (if ferritin abnormal)    [] []  Ophthalmology       Sicca/Cataracts   []  []  Dermatology/musculo-skeletal        GVHD (skin & ROM)        Cancer screening (Derm referral)            [] []  [] []  Dental       GVHD       Routine Care   []  []              Majhail, et al. (2012). Recommended Screening and Preventive Practices for Long-Term Survivors after                    Hematopoietic Cell Transplantation. Biology of Blood and Marrow Transplantation, Volume 18,                    Issue 3, March 2012, Pages 348-371.Conditioning/Transplant:-Melphalan 70 mg/m2 over 30 minutes on D-6, *Melphalan dosed reduced to 70 mg/m2 due to age-Fludarabine 40 mg/m2 over 30 minutes every 24hr for 4 doses from D-5 to D -2             -Stem cell infusion day 0 (09/24/22)Will follow up in 2 weeks in hematology clinic with APP and will have PentamidineLisa Leticia Clas APRNMHB or [phone]

## 2023-02-12 ENCOUNTER — Ambulatory Visit: Admit: 2023-02-12 | Payer: MEDICARE | Attending: Medical Oncology

## 2023-02-12 ENCOUNTER — Ambulatory Visit: Admit: 2023-02-12 | Payer: MEDICARE

## 2023-02-18 ENCOUNTER — Ambulatory Visit: Admit: 2023-02-18 | Payer: MEDICARE

## 2023-02-18 ENCOUNTER — Encounter: Admit: 2023-02-18 | Payer: PRIVATE HEALTH INSURANCE

## 2023-02-18 ENCOUNTER — Ambulatory Visit: Admit: 2023-02-18 | Payer: MEDICARE | Attending: Adult Health

## 2023-02-22 ENCOUNTER — Inpatient Hospital Stay: Admit: 2023-02-22 | Discharge: 2023-02-22 | Payer: MEDICARE

## 2023-02-22 ENCOUNTER — Encounter: Admit: 2023-02-22 | Payer: MEDICARE | Attending: Ophthalmology

## 2023-02-22 ENCOUNTER — Ambulatory Visit: Admit: 2023-02-22 | Payer: MEDICARE | Attending: Adult Health

## 2023-02-22 ENCOUNTER — Encounter: Admit: 2023-02-22 | Payer: PRIVATE HEALTH INSURANCE | Attending: Ophthalmology

## 2023-02-22 ENCOUNTER — Encounter: Admit: 2023-02-22 | Payer: PRIVATE HEALTH INSURANCE

## 2023-02-22 ENCOUNTER — Encounter: Admit: 2023-02-22 | Payer: MEDICARE

## 2023-02-22 VITALS — BP 152/77 | HR 93 | Temp 98.10000°F | Resp 19 | Wt 210.8 lb

## 2023-02-22 DIAGNOSIS — C931 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: Secondary | ICD-10-CM

## 2023-02-22 DIAGNOSIS — Z9481 Bone marrow transplant status: Secondary | ICD-10-CM

## 2023-02-22 DIAGNOSIS — Z8249 Family history of ischemic heart disease and other diseases of the circulatory system: Secondary | ICD-10-CM

## 2023-02-22 DIAGNOSIS — N400 Enlarged prostate without lower urinary tract symptoms: Secondary | ICD-10-CM

## 2023-02-22 DIAGNOSIS — F32A Depression: Secondary | ICD-10-CM

## 2023-02-22 DIAGNOSIS — F419 Anxiety disorder, unspecified: Secondary | ICD-10-CM

## 2023-02-22 DIAGNOSIS — H409 Unspecified glaucoma: Secondary | ICD-10-CM

## 2023-02-22 DIAGNOSIS — R0609 Other forms of dyspnea: Secondary | ICD-10-CM

## 2023-02-22 DIAGNOSIS — C9110 Chronic lymphocytic leukemia of B-cell type not having achieved remission: Secondary | ICD-10-CM

## 2023-02-22 DIAGNOSIS — I119 Hypertensive heart disease without heart failure: Secondary | ICD-10-CM

## 2023-02-22 DIAGNOSIS — H43393 Other vitreous opacities, bilateral: Secondary | ICD-10-CM

## 2023-02-22 DIAGNOSIS — E782 Mixed hyperlipidemia: Secondary | ICD-10-CM

## 2023-02-22 DIAGNOSIS — I10 Essential (primary) hypertension: Secondary | ICD-10-CM

## 2023-02-22 DIAGNOSIS — C4491 Basal cell carcinoma of skin, unspecified: Secondary | ICD-10-CM

## 2023-02-22 LAB — CBC WITH AUTO DIFFERENTIAL
BKR WAM ABSOLUTE IMMATURE GRANULOCYTES.: 0.08 x 1000/ÂµL (ref 0.00–0.30)
BKR WAM ABSOLUTE LYMPHOCYTE COUNT.: 0.75 x 1000/ÂµL (ref 0.60–3.70)
BKR WAM ABSOLUTE NRBC (2 DEC): 0 x 1000/ÂµL (ref 0.00–1.00)
BKR WAM ANC (ABSOLUTE NEUTROPHIL COUNT): 2.73 x 1000/ÂµL (ref 2.00–7.60)
BKR WAM BASOPHIL ABSOLUTE COUNT.: 0.04 x 1000/ÂµL (ref 0.00–1.00)
BKR WAM BASOPHILS: 1 % (ref 0.0–1.4)
BKR WAM EOSINOPHIL ABSOLUTE COUNT.: 0.04 x 1000/ÂµL (ref 0.00–1.00)
BKR WAM EOSINOPHILS: 1 % (ref 0.0–5.0)
BKR WAM HEMATOCRIT (2 DEC): 36.3 % — ABNORMAL LOW (ref 38.50–50.00)
BKR WAM HEMOGLOBIN: 12.1 g/dL — ABNORMAL LOW (ref 13.2–17.1)
BKR WAM IMMATURE GRANULOCYTES: 1.9 % — ABNORMAL HIGH (ref 0.0–1.0)
BKR WAM LYMPHOCYTES: 18.1 % (ref 17.0–50.0)
BKR WAM MCH (PG): 33.7 pg — ABNORMAL HIGH (ref 27.0–33.0)
BKR WAM MCHC: 33.3 g/dL (ref 31.0–36.0)
BKR WAM MCV: 101.1 fL — ABNORMAL HIGH (ref 80.0–100.0)
BKR WAM MONOCYTE ABSOLUTE COUNT.: 0.5 x 1000/ÂµL (ref 0.00–1.00)
BKR WAM MONOCYTES: 12.1 % — ABNORMAL HIGH (ref 4.0–12.0)
BKR WAM MPV: 10.7 fL (ref 8.0–12.0)
BKR WAM NEUTROPHILS: 65.9 % (ref 39.0–72.0)
BKR WAM NUCLEATED RED BLOOD CELLS: 0 % (ref 0.0–1.0)
BKR WAM PLATELETS: 170 x1000/ÂµL (ref 150–420)
BKR WAM RDW-CV: 14.4 % (ref 11.0–15.0)
BKR WAM RED BLOOD CELL COUNT.: 3.59 M/ÂµL — ABNORMAL LOW (ref 4.00–6.00)
BKR WAM WHITE BLOOD CELL COUNT: 4.1 x1000/ÂµL (ref 4.0–11.0)

## 2023-02-22 LAB — MAGNESIUM: BKR MAGNESIUM: 1.9 mg/dL (ref 1.7–2.4)

## 2023-02-22 LAB — COMPREHENSIVE METABOLIC PANEL
BKR A/G RATIO: 2.1 (ref 1.0–2.2)
BKR ALANINE AMINOTRANSFERASE (ALT): 15 U/L (ref 9–59)
BKR ALBUMIN: 4.1 g/dL (ref 3.6–5.1)
BKR ALKALINE PHOSPHATASE: 80 U/L (ref 9–122)
BKR ANION GAP: 11 (ref 7–17)
BKR ASPARTATE AMINOTRANSFERASE (AST): 18 U/L (ref 10–35)
BKR AST/ALT RATIO: 1.2
BKR BILIRUBIN TOTAL: 0.6 mg/dL (ref <=1.2)
BKR BLOOD UREA NITROGEN: 14 mg/dL (ref 8–23)
BKR BUN / CREAT RATIO: 12.8 (ref 8.0–23.0)
BKR CALCIUM: 8.8 mg/dL (ref 8.8–10.2)
BKR CHLORIDE: 107 mmol/L (ref 98–107)
BKR CO2: 23 mmol/L (ref 20–30)
BKR CREATININE DELTA: 0.17
BKR CREATININE: 1.09 mg/dL (ref 0.40–1.30)
BKR EGFR, CREATININE (CKD-EPI 2021): >60 mL/min/{1.73_m2} (ref >=60)
BKR GLOBULIN: 2 g/dL (ref 2.0–3.9)
BKR GLUCOSE: 126 mg/dL — ABNORMAL HIGH (ref 70–100)
BKR POTASSIUM: 4.2 mmol/L (ref 3.3–5.3)
BKR PROTEIN TOTAL: 6.1 g/dL (ref 5.9–8.3)
BKR SODIUM: 141 mmol/L (ref 136–144)

## 2023-02-22 LAB — LACTATE DEHYDROGENASE: BKR LACTATE DEHYDROGENASE: 214 U/L (ref 122–241)

## 2023-02-22 LAB — TACROLIMUS LEVEL     (BH GH L LMW YH): BKR TACROLIMUS BLOOD: 1.2 ng/mL — ABNORMAL LOW

## 2023-02-22 MED ORDER — PENTAMIDINE FOR INHALATION SOLUTION
Freq: Once | RESPIRATORY_TRACT | Status: CP
Start: 2023-02-22 — End: ?
  Administered 2023-02-22: 15:00:00 6.000 mL via RESPIRATORY_TRACT

## 2023-02-22 MED ORDER — ALBUTEROL SULFATE HFA 90 MCG/ACTUATION AEROSOL INHALER
90 | Freq: Once | RESPIRATORY_TRACT | Status: CP
Start: 2023-02-22 — End: ?
  Administered 2023-02-22: 15:00:00 90 mcg/actuation via RESPIRATORY_TRACT

## 2023-02-22 NOTE — Patient Instructions
 Patient Education Detached retina The Basics Written by the doctors and editors at UpToDate What is a detached retina? -- A detached retina is a serious eye condition that causes vision changes. It can lead to vision loss and even blindness.The retina is a layer of tissue at the back of the eye (picture 1). It sends nerve signals to the brain about what we see. If the retina gets a tear or hole, it can start to peel off the back of the eye. If this happens, the person can lose the ability to see.A detached retina most often happens for no clear reason. But older age or getting an eye injury can raise a person's risk. The risk is also higher in people who are very near-sighted. This means they can see clearly up close, but things at a distance look blurry.What are the symptoms of a detached retina? -- The symptoms include:Seeing dark or floating spots (doctors call these floaters). These can look like:A spiderwebA large spot that comes and goes - It might look like a big fly.Many small black spotsSmall floaters are very common. They usually don't mean there is an eye problem. But when they happen suddenly, become more frequent, or are very large, they can be a sign of a problem.Other symptoms can include:Seeing flashes of light - The flashes might be easier to see at night or in a dark room.Losing vision - A person might not be able to see detail, or only see shapes.Should I see a doctor or nurse? -- Yes. See your doctor or nurse right away if you suddenly lose vision or see flashes of light, large floaters, or many floaters. These could be warning signs of a detached retina. If it is not treated, it can lead to vision loss.Seeing floaters and flashes of light can also be a sign of a condition called posterior vitreous detachment. This condition is fairly common. It is not as serious as a detached retina. But people who get a posterior vitreous detachment need to see a doctor on a regular basis to make sure it does not become a detached retina.Is there a test for detached retina? -- Yes. To check for a detached retina, your doctor can do an eye exam and test your vision.If the doctor thinks you might have a detached retina, you will need to see an eye doctor. The eye doctor will do a procedure called a dilated eye exam. This means the doctor gives you eye drops that help them see inside your eye. Then the doctor looks at your retina to see if it is detached. They will use some special devices or lights that help them see the retina.How is a detached retina treated? -- If the retina is detached, doctors treat it right away. This can help keep it from getting worse and causing blindness. Doctors can treat a detached retina by:Injecting a bubble of gas into the eye - This can push the retina back into place.Doing surgery - Doctors use different methods to put the detached retina back and hold it in place.All topics are updated as new evidence becomes available and our peer review process is complete.This topic retrieved from UpToDate on: Aug 04, 2021.Topic [id number] Version 17.0Release: 31.2.26 - C31.169? 2023 UpToDate, Inc. and/or its affiliates. All rights reserved.picture 1: Detached retina The retina is the tissue at the back of the eye. If it gets a tear or hole, it can start to come off the back of the eye. This causes vision changes. It  can cause vision loss or even blindness.Graphic 41324 Version 1.0Consumer Information Use and Disclaimer This generalized information is a limited summary of diagnosis, treatment, and/or medication information. It is not meant to be comprehensive and should be used as a tool to help the user understand and/or assess potential diagnostic and treatment options. It does NOT include all information about conditions, treatments, medications, side effects, or risks that may apply to a specific patient. It is not intended to be medical advice or a substitute for the medical advice, diagnosis, or treatment of a health care provider based on the health care provider's examination and assessment of a patient's specific and unique circumstances. Patients must speak with a health care provider for complete information about their health, medical questions, and treatment options, including any risks or benefits regarding use of medications. This information does not endorse any treatments or medications as safe, effective, or approved for treating a specific patient. UpToDate, Inc. and its affiliates disclaim any warranty or liability relating to this information or the use thereof.The use of this information is governed by the Terms of Use, available at https://www.wolterskluwer.com/en/know/clinical-effectiveness-terms ?2023 UpToDate, Inc. and its affiliates and/or licensors. All rights reserved.Copyright ? 2023 UpToDate, Inc. and/or its affiliates. All rights reserved.

## 2023-02-22 NOTE — Progress Notes
 Pt here for labs, APRN f/u, and Pentam. Reports feeling well overall, did not offer any acute complaints. VSS. Labs drawn peripherally per orders and did not require any interventions today. Pt received Pentam by RT per orders w/o incident. Pt seen by Larwance Rote, APRN, and cleared for discharge to home. All questions/ comments/ concerns addressed. Dispo per APRN's note. Pt discharged to home w/ supportive spouse in stable condition.Magnus Ivan, RN

## 2023-02-23 MED FILL — FOLIC ACID 1 MG TABLET: 1 mg | ORAL | 30 days supply | Qty: 30 | Fill #5 | Status: CP

## 2023-02-25 ENCOUNTER — Encounter: Admit: 2023-02-25 | Payer: PRIVATE HEALTH INSURANCE | Attending: Hematology & Oncology

## 2023-03-02 ENCOUNTER — Encounter: Admit: 2023-03-02 | Payer: PRIVATE HEALTH INSURANCE | Attending: Foot & Ankle Surgery

## 2023-03-02 ENCOUNTER — Ambulatory Visit: Admit: 2023-03-02 | Payer: MEDICARE | Attending: Foot & Ankle Surgery

## 2023-03-02 VITALS — BP 152/78 | HR 94 | Temp 97.40000°F | Resp 18

## 2023-03-02 DIAGNOSIS — I119 Hypertensive heart disease without heart failure: Secondary | ICD-10-CM

## 2023-03-02 DIAGNOSIS — F32A Depression: Secondary | ICD-10-CM

## 2023-03-02 DIAGNOSIS — E782 Mixed hyperlipidemia: Secondary | ICD-10-CM

## 2023-03-02 DIAGNOSIS — B353 Tinea pedis: Secondary | ICD-10-CM

## 2023-03-02 DIAGNOSIS — I10 Essential (primary) hypertension: Secondary | ICD-10-CM

## 2023-03-02 DIAGNOSIS — H409 Unspecified glaucoma: Secondary | ICD-10-CM

## 2023-03-02 DIAGNOSIS — C931 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: Secondary | ICD-10-CM

## 2023-03-02 DIAGNOSIS — C9110 Chronic lymphocytic leukemia of B-cell type not having achieved remission: Secondary | ICD-10-CM

## 2023-03-02 DIAGNOSIS — Z8249 Family history of ischemic heart disease and other diseases of the circulatory system: Secondary | ICD-10-CM

## 2023-03-02 DIAGNOSIS — L603 Nail dystrophy: Secondary | ICD-10-CM

## 2023-03-02 DIAGNOSIS — R0609 Other forms of dyspnea: Secondary | ICD-10-CM

## 2023-03-02 DIAGNOSIS — C4491 Basal cell carcinoma of skin, unspecified: Secondary | ICD-10-CM

## 2023-03-02 DIAGNOSIS — N400 Enlarged prostate without lower urinary tract symptoms: Secondary | ICD-10-CM

## 2023-03-02 DIAGNOSIS — F419 Anxiety disorder, unspecified: Secondary | ICD-10-CM

## 2023-03-02 MED ORDER — TOLNAFTATE 1 % TOPICAL POWDER
1 | Freq: Two times a day (BID) | TOPICAL | 6 refills | Status: AC
Start: 2023-03-02 — End: ?

## 2023-03-02 NOTE — Patient Instructions
 Purchase a par of these  via online search. These are soft, no hard plastic. They are black and green, no yellow or blue color on them .Google this exact name (DO not get the Spenco Orthotic, it has hard plastic in it)Spenco Full length Arch cushion (to toes)

## 2023-03-03 DIAGNOSIS — N400 Enlarged prostate without lower urinary tract symptoms: Secondary | ICD-10-CM

## 2023-03-03 DIAGNOSIS — L601 Onycholysis: Secondary | ICD-10-CM

## 2023-03-03 DIAGNOSIS — Z5111 Encounter for antineoplastic chemotherapy: Secondary | ICD-10-CM

## 2023-03-03 DIAGNOSIS — G629 Polyneuropathy, unspecified: Secondary | ICD-10-CM

## 2023-03-03 DIAGNOSIS — F1090 Alcohol use, unspecified, uncomplicated: Secondary | ICD-10-CM

## 2023-03-03 DIAGNOSIS — Z452 Encounter for adjustment and management of vascular access device: Secondary | ICD-10-CM

## 2023-03-03 DIAGNOSIS — C9110 Chronic lymphocytic leukemia of B-cell type not having achieved remission: Secondary | ICD-10-CM

## 2023-03-03 DIAGNOSIS — Z883 Allergy status to other anti-infective agents status: Secondary | ICD-10-CM

## 2023-03-03 DIAGNOSIS — I119 Hypertensive heart disease without heart failure: Secondary | ICD-10-CM

## 2023-03-03 DIAGNOSIS — Z9484 Stem cells transplant status: Secondary | ICD-10-CM

## 2023-03-03 DIAGNOSIS — E782 Mixed hyperlipidemia: Secondary | ICD-10-CM

## 2023-03-03 DIAGNOSIS — Z9089 Acquired absence of other organs: Secondary | ICD-10-CM

## 2023-03-03 DIAGNOSIS — M2042 Other hammer toe(s) (acquired), left foot: Secondary | ICD-10-CM

## 2023-03-03 DIAGNOSIS — Z8546 Personal history of malignant neoplasm of prostate: Secondary | ICD-10-CM

## 2023-03-05 ENCOUNTER — Encounter: Admit: 2023-03-05 | Payer: PRIVATE HEALTH INSURANCE | Attending: Medical Oncology

## 2023-03-05 NOTE — Progress Notes
 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 Sherrell Puller INTERIM HISTORY: Douglas [name] is days 155  post transplant. History of Present IllnessThe patient, a 73 year old individual with a history of recent bone marrow transplant, presents with increased anxiety and depressive symptoms. He reports a long-standing history of nervousness, which has been exacerbated recently due to various stressors, including health concerns and car troubles. The patient's spouse corroborates this, noting an increase in the patient's anxiety levels and difficulty in calming him down. The patient's anxiety is reportedly triggered by minor issues and has been a consistent feature throughout his 50-year marriage.The patient also reports a history of seasonal depression, which was previously managed by residing in a warmer climate. However, he is currently residing in a colder climate, which has led to increased feelings of confinement and discomfort. The patient's spouse reports that the patient has been less active, spending most of his time sitting, which has led to concerns about his physical health and strength.In addition to his mental health concerns, the patient reports a recent issue with his right eye, describing an increase in floaters. He has an upcoming appointment with an ophthalmologist to address this issue. The patient also mentions a concern with his toe, which he initially thought was an ingrown nail but now believes to be a blister. He has an appointment with a podiatrist scheduled for the following week.The patient's recent health history includes a bone marrow transplant, and he is currently on a tapering plan for his immunosuppressant medication, Tacrolimus. He reports no current issues with his mouth or graft-versus-host disease symptoms. However, he has noticed increased dryness and itchiness of his skin, which he has been managing with over-the-counter creams. The patient's blood work, including liver and kidney function, is reported to be within normal limits.The patient expresses a strong desire to return home to [place] and is considering the possibility of flying back for necessary medical appointments. He is awaiting further guidance from his medical team regarding the timing and safety of this transition. HPI:   [name] has a hx of Hypertension, hyperlipidemia, basal cell cancer of the skin, iron deficiency anemia.  The patient was evaluated for cytopenias in 2021 in [place].  Initial evaluation from [facility], source records are not available today.  Per the patient's subsequent records, he initially presented with leukopenia, thrombocytopenia, and monocytosis.  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  Initial bone marrow was performed on August 24, 2019, was reviewed at [facility] with findings of multilineage dysplasia.  The marrow was 40% cellular.  Megakaryocytes showed frequent dysplasia with  hyperchromatic forms and clustering.  Blasts were not increased.  A FISH panel for MDS  loci was negative.  Karyotype was normal. 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  The patient was observed and was without any treatment or transfusions.  In 2023, he had an acute drop in hemoglobin, was found to be iron deficient.  Workup for this did not show a clear focus of blood loss or bleeding.  The patient has continued evaluation and established care with Dr. Sherrell Puller in June 2023 as he spends some summers in [place].  Of note, there is a label of CLL in the patient's chart, which I suspect was a typo, and find no evidence that support that diagnosis.  Dr. Dema Severin assessment in June 2023  per the CPSS molecular score being considered intermediate one with a low risk of development of leukemia and median survival over 5 years.  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  The patient had a marrow performed at [facility] on 29th with CD34 stain showing 10% to 15% blasts focally more than 20%.  Marrow was hypercellular at 70%.  Dysplastic megakaryocytes again appreciated.  Molecular studies are pending.  Given the findings, treatment with the decitabine and venetoclax was recommended.  A referral has been placed for urgent EGD colonoscopy given the low iron concern for occult source of bleeding.He started treatment last week which he is tolerated well. He received 5-6 red blood cell transfusion in the past several month. He was suffering weakness and dyspnea which is now improved after transfusion. He does not have a significant infectious history, he did have COVID-19 in the past from which he recovered. He has hypertension and hyperlipidemia managed by cardiologist in [place]. He reports a negative stress test in the past. He has had number of skin cancers and has had several MOHS procedures. He is followed with a urologist for some BPH and abnormal PSA. He has had 2 prostate biopsies which was negative. He carries a diagnosis of iron deficiency and require transfusion in [place] after a significant and acute drop in hemoglobin. There was no clinical evidence of bleeding. Endoscopy and colonoscopy are planned to exclude a source of bleeding. Douglas [name] is a 73 yo gentlemen that lives with is wife in [place].  He is currently spending the summer in [place] visiting family.  He has 2 daughters and 5 grandchildren.  Retired, having worked for [employer].  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  He is a social drinker of 1-2 beers per week, denies illicit drug use and is a non smoker.  Covid-19 vaccine series completed. 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)   CLL (chronic lymphocytic leukemia) (HC Code)   Depression   DOE (dyspnea on exertion)   Enlarged prostate   Essential hypertension   Family history of ischemic heart disease   Glaucoma   Hypertensive heart disease without heart failure   Mixed hyperlipidemia  Surgical HistoryPast Surgical History: Procedure Laterality Date  ADENOIDECTOMY    BONE MARROW BIOPSY    PROSTATE BIOPSY    TONSILLECTOMY    VASCULAR SURGERY   AllergiesAllergies Allergen Reactions  Voriconazole Other (See Comments)   Gingival edema/redness making it difficult to bite down  Current Outpatient Medications:   acyclovir (ZOVIRAX) 400 mg tablet, Take 1 tablet (400 mg total) by mouth every 12 (twelve) hours., Disp: 180 tablet, Rfl: 3  amLODIPine (NORVASC) 10 mg tablet, Take 1 tablet (10 mg total) by mouth daily., Disp: , Rfl:   finasteride (PROSCAR) 5 mg tablet, TAKE 1 TABLET BY MOUTH DAILY FOR ENLARGED PROSTATE WITH URINATION PROBLEM TAKE WITH OR WITHOUT MEALS, Disp: , Rfl:   folic acid (FOLVITE) 1 mg tablet, Take 1 tablet (1 mg  total) by mouth daily., Disp: 30 tablet, Rfl: 11  latanoprost (XALATAN) 0.005 % ophthalmic solution, Place 1 drop into both eyes nightly., Disp: , Rfl:   magnesium oxide 400 mg magnesium Cap, Take 400 mg by mouth daily., Disp: , Rfl:   Miscellaneous Medical Supply, Please provide rolling walker, Use as directed., Disp: 1 each, Rfl: 0 multivitamin tablet, Take 1 tablet by mouth daily., Disp: 30 tablet, Rfl: 11  ondansetron (ZOFRAN) 8 mg tablet, Take 1 tablet (8 mg total) by mouth every 8 (eight) hours as needed for nausea or vomiting. (Patient not taking: Reported on 12/16/2022), Disp: 30 tablet, Rfl: 2  pentamidine (PENTAM) 300 mg inhalation solution, Inhale 6 mLs (300 mg total) into the lungs every 28 days. Last given 01/15/23, Disp: , Rfl:   sodium chloride 0.9 % flush, Use 10 mLs by IV Push route daily. In both lumens, Disp: 600 mL, Rfl: 11  tacrolimus (PROGRAF) 0.5 mg Immediate Release capsule, Take 1 capsule (0.5 mg total) by mouth 2 (two) times daily. (Patient not taking: Reported on 03/02/2023), Disp: 60 capsule, Rfl: 11  tacrolimus (PROGRAF) 1 mg immediate release capsule, Take 4 capsules (4 mg total) by mouth 2 (two) times daily. 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)   CLL (chronic lymphocytic leukemia) (HC Code)   Depression   DOE (dyspnea on exertion)   Enlarged prostate   Essential hypertension   Family history of ischemic heart disease   Glaucoma   Hypertensive heart disease without heart failure   Mixed hyperlipidemia  Family History Problem Relation Age of Onset  Breast cancer Mother   Liver cancer Mother   Glaucoma Father   Heart disease Father   Prostate cancer Father   Breast cancer Sister   Thyroid disease Sister   Hearing loss Sister   Diabetes Daughter   No Known Problems Daughter   No Known Problems Grandchild  Social History Socioeconomic History  Marital status: Married   Spouse name: Not on file  Number of children: Not on file  Years of education: Not on file  Highest education level: Not on file Occupational History  Not on file Tobacco Use  Smoking status: Never  Smokeless tobacco: Never Vaping Use  Vaping status: Never Used Substance and Sexual Activity  Alcohol use: Yes   Alcohol/week: 2.0 standard drinks of alcohol   Types: 2 Cans of beer per week   Comment: social drinker once a week  Drug use: Never  Sexual activity: Not on file Other Topics Concern  Not on file Social History Narrative  Not on file Social Drivers of Health Financial Resource Strain: Low Risk  (08/05/2021)  Overall Financial Resource Strain (CARDIA)   Difficulty of Paying Living Expenses: Not very hard Food Insecurity: No Food Insecurity (09/18/2022)  Hunger Vital Sign   Worried About Running Out of Food in the Last Year: Never true   Ran Out of Food in the Last Year: Never true Transportation Needs: No Transportation Needs (09/18/2022)  PRAPARE - Designer, jewellery (Medical): No   Lack of Transportation (Non-Medical): No Physical Activity: Not on file Stress: Not on file Social Connections: Not on  file Intimate Partner Violence: Not on file Housing Stability: Low Risk  (09/18/2022)  Housing Stability   Housing Stability: I have a steady place to live   Housing Stability: Not on file EXAM: General Appearance:He is heavy set, appears wellHEENT:  Mucous membranes moist, No oral lesions, no erythema, teeth are adequate repair Lungs: ClearCor: RRR, no m/r/gAbd: soft nontender, no hepatosplenomegaly, Extr: trace edema.  Had a paronychia about the lateral aspect of left big toe with an ingrown toe nail. See picture below for more details. Looks less swollen today. Skin:no rash  Lymph Nodes - no palpable adenopathy. Neurologic: mentation appears normal, normal speech, strength symmetric. LABSLab Results Component Value Date  WBC 4.1 02/22/2023  HGB 12.1 (L) 02/22/2023  HCT 36.30 (L) 02/22/2023  MCV 101.1 (H) 02/22/2023  PLT 170 02/22/2023 Comprehensive Metabolic Panel:Lab Results Component Value Date  GLU 126 (H) 02/22/2023  BUN 14 02/22/2023  CREATININE 1.09 02/22/2023  NA 141 02/22/2023  K 4.2 02/22/2023  CL 107 02/22/2023  CO2 23 02/22/2023  ALBUMIN 4.1 02/22/2023  PROT 6.1 02/22/2023  BILITOT 0.6 02/22/2023  ALKPHOS 80 02/22/2023  ALT 15 02/22/2023  GLOB 2.0 02/22/2023  CALCIUM 8.8 02/22/2023 No results found for requested labs within last 7 days. 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  His disease is characterized by minor cytopenias over a long period of observation with recently worsened anemia, thrombocytopenia, and a marrow with increased blasts consistent with disease progression. 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 Therapy was   started with decitabine and venetoclax, and transplantation  recommended given the now increased risk of shorten survival. The patient has had a formal  search of the registry and a 10/10 match was identified.A/P: Day 155 no evidence of gvhd. Assessment & PlanAnxiety  Chronic anxiety has recently worsened. We discussed the risks and benefits of benzodiazepines and the potential for combining therapy with an antidepressant. We will review his medication list for potential interactions with anxiolytic medications and consider a referral for therapy.Dry Skin  He has dry, itchy skin with some areas of petechiae but no signs of rash or graft versus host disease. We will continue with hydrocortisone cream and Lubriderm, applying lotion twice daily, especially after showering and before bed. We will also consider the use of a humidifier in his bedroom.Post-Transplant Care  He is post-transplant with good recovery, currently tapering Tacrolimus. We will continue the Tacrolimus taper as planned, schedule a bone marrow biopsy for the week of 20th January 2025, and plan follow-up appointments to review bone marrow results and monitor for signs of graft versus host disease.Foot Concern  He has a possible blister or ingrown toenail. He will attend a scheduled podiatrist appointment for evaluation.Glaucoma  He has a history of glaucoma and is on eye drops. We will continue with the current treatment and ensure he attends his scheduled ophthalmology appointment.General Health Maintenance  We will continue monitoring liver function while on Tacrolimus and consider a flu vaccination if not already administered for the season.1) Disease - Bone marrow biopsy September 10th, this was consistent with remission by morphology. Donor percentage was 100%, FISH and gene panel are normal.  Bone marrow biopsy on 01/05/23 was consistent with remission, no increase in blasts, and donor percentage is 100%, the fish was 100% donor and the gene panel was negative. 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 Goal is tac 5-10, check weekly ldh, haptoglobin, triglycerides. tapered tacrolimus to 4 mg in the morning and 4 mg in  the evening. 3) Heme - Donor, Recipient both blood type A+ve. Blood counts looks good today. 4) ID - Prophylaxis with acyclovir, pentamidine, and Cresemba. He is not at risk for CMV. 5) Hypertension - He is on continued Norvasc. 6) Liver: Continue Ursodiol, monitor mild LFT changes. Normal today. 7) History of skin cancer: Needs to be addressed after 3 month time point. Planned Mohs procedure on December 18th. 8) Abnormal spine, lesion on MRI pre- transplant: Repeated MRI on November 14th. The abnormal 3 cm area in L2 was stable and there was some subtle enhancement of the cauda equina nerve roots is noted on the sagittal postcontrast, of unclear significance enhancement and this was referred to less conspicuous. 9) Floaters: The patient was evaluated by Dr Franklyn Lor, ophthalmologist. Plan to follow him in again in 6-8 weeks. 10) Paronychia in left big toe: Referred to podiatrist for further treatment. He completed Keflex 500 mg 4 times daily for 7 days with some improvement, follow up with Podiatry. Fu WITH AMY for bone marrow per patient preference. Electronically Signed by Larwance Rote II, APRN, January 17, 2025I provided a concise overview of the ambient note generation solution. Rodena Goldmann or their legally authorized representative verbally consented to a temporary audio recording of their visit to assist with completing the visit documentation using an AI-powered solution. This note was reviewed for accuracy by Larwance Rote II, APRN who performed the clinical service.

## 2023-03-06 ENCOUNTER — Encounter: Admit: 2023-03-06 | Payer: PRIVATE HEALTH INSURANCE | Attending: Medical Oncology

## 2023-03-06 NOTE — Progress Notes
 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 Sherrell Puller INTERIM HISTORY: Douglas [name] is days 127  post transplant. Overall, he is doing well. He has paronychia in left big toe. He was referred to podiatrist for further treatment however, he has not yet received the appointment yet. He completed a course of Keflex with some improvement. He still reports floaters although less. He has been eating and drinking well. He denies cold, fever, or interim infection.  We reviewed the current medication list and made the necessary changes to chart. HPI:   [name] has a hx of Hypertension, hyperlipidemia, basal cell cancer of the skin, iron deficiency anemia.  The patient was evaluated for cytopenias in 2021 in [place].  Initial evaluation from [facility], source records are not available today.  Per the patient's subsequent records, he initially presented with leukopenia, thrombocytopenia, and monocytosis.  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  Initial bone marrow was performed on August 24, 2019, was reviewed at [facility] with findings of multilineage dysplasia.  The marrow was 40% cellular.  Megakaryocytes showed frequent dysplasia with  hyperchromatic forms and clustering.  Blasts were not increased.  A FISH panel for MDS  loci was negative.  Karyotype was normal. 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  The patient was observed and was without any treatment or transfusions.  In 2023, he had an acute drop in hemoglobin, was found to be iron deficient.  Workup for this did not show a clear focus of blood loss or bleeding.  The patient has continued evaluation and established care with Dr. Sherrell Puller in June 2023 as he spends some summers in [place].  Of note, there is a label of CLL in the patient's chart, which I suspect was a typo, and find no evidence that support that diagnosis.  Dr. Dema Severin assessment in June 2023 per the CPSS molecular score being considered intermediate one with a low risk of development of leukemia and median survival over 5 years.  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  The patient had a marrow performed at [facility] on 29th with CD34 stain showing 10% to 15% blasts focally more than 20%.  Marrow was hypercellular at 70%.  Dysplastic megakaryocytes again appreciated.  Molecular studies are pending.  Given the findings, treatment with the decitabine and venetoclax was recommended.  A referral has been placed for urgent EGD colonoscopy given the low iron concern for occult source of bleeding.He started treatment last week which he is tolerated well. He received 5-6 red blood cell transfusion in the past several month. He was suffering weakness and dyspnea which is now improved after transfusion. He does not have a significant infectious history, he did have COVID-19 in the past from which he recovered. He has hypertension and hyperlipidemia managed by cardiologist in [place]. He reports a negative stress test in the past. He has had number of skin cancers and has had several MOHS procedures. He is followed with a urologist for some BPH and abnormal PSA. He has had 2 prostate biopsies which was negative. He carries a diagnosis of iron deficiency and require transfusion in  [place] after a significant and acute drop in hemoglobin. There was no clinical evidence of bleeding. Endoscopy and colonoscopy are planned to exclude a source of bleeding. Douglas [name] is a 73 yo gentlemen that lives with is wife in [place].  He is currently spending the summer in [place] visiting family.  He has 2 daughters and 5 grandchildren.  Retired, having worked for [employer].  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  He is a social drinker of 1-2 beers per week, denies illicit drug use and is a non smoker.  Covid-19 vaccine series completed. 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)   CLL (chronic lymphocytic leukemia) (HC Code)   Depression   DOE (dyspnea on exertion)   Enlarged prostate   Essential hypertension   Family history of ischemic heart disease   Glaucoma   Hypertensive heart disease without heart failure   Mixed hyperlipidemia  Surgical HistoryPast Surgical History: Procedure Laterality Date  ADENOIDECTOMY    BONE MARROW BIOPSY    PROSTATE BIOPSY    TONSILLECTOMY    VASCULAR SURGERY   AllergiesAllergies Allergen Reactions  Voriconazole Other (See Comments)   Gingival edema/redness making it difficult to bite down  Current Outpatient Medications:   acyclovir (ZOVIRAX) 400 mg tablet, Take 1 tablet (400 mg total) by mouth every 12 (twelve) hours., Disp: 180 tablet, Rfl: 3  amLODIPine (NORVASC) 10 mg tablet, Take 1 tablet (10 mg total) by mouth., Disp: , Rfl:   amLODIPine (NORVASC) 10 mg tablet, Take 1 tablet (10 mg total) by mouth daily., Disp: , Rfl:   finasteride (PROSCAR) 5 mg tablet, TAKE 1 TABLET BY MOUTH DAILY FOR ENLARGED PROSTATE WITH URINATION PROBLEM TAKE WITH OR WITHOUT MEALS, Disp: , Rfl:   folic acid (FOLVITE) 1 mg tablet, Take 1 tablet (1 mg total) by mouth daily., Disp: 30 tablet, Rfl: 11  latanoprost (XALATAN) 0.005 % ophthalmic solution, Place 1 drop into both eyes nightly., Disp: , Rfl:   magnesium oxide 400 mg magnesium Cap, Take 400 mg by mouth daily., Disp: , Rfl:   Miscellaneous Medical Supply, Please provide rolling walker, Use as directed., Disp: 1 each, Rfl: 0  multivitamin tablet, Take 1 tablet by mouth daily., Disp: 30 tablet, Rfl: 11  ondansetron (ZOFRAN) 8 mg tablet, Take 1 tablet (8 mg total) by mouth every 8 (eight) hours as needed for nausea or vomiting. (Patient not taking: Reported on 01/29/2023), Disp: 30 tablet, Rfl: 2  pentamidine (PENTAM) 300 mg inhalation solution, Inhale 6 mLs (300 mg total) into the lungs every 28 days. Last given 01/15/23, Disp: , Rfl:   sodium chloride 0.9 % flush, Use 10 mLs by IV Push route daily. In both lumens, Disp: 600 mL, Rfl: 11  tacrolimus (PROGRAF) 0.5 mg Immediate Release capsule, Take 1 capsule (0.5 mg total) by mouth 2 (two) times daily., Disp: 60 capsule, Rfl: 11  tacrolimus (PROGRAF) 1 mg immediate release capsule, Take 4 capsules (4 mg total) by mouth 2 (two) times daily. 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)   CLL (chronic lymphocytic leukemia) (HC Code)   Depression   DOE (dyspnea on exertion)   Enlarged prostate   Essential hypertension   Family history of ischemic heart disease   Glaucoma   Hypertensive heart disease without heart failure   Mixed hyperlipidemia  Family History Problem Relation Age of Onset  Breast cancer Mother   Liver cancer Mother   Glaucoma Father   Heart disease Father   Prostate cancer Father   Breast cancer Sister   Thyroid disease Sister   Hearing loss Sister Diabetes Daughter   No Known Problems Daughter   No Known Problems Grandchild  Social History Socioeconomic History  Marital status: Married   Spouse name: Not on file  Number of children: Not on file  Years of education: Not on file  Highest education level: Not on file Occupational History  Not on file Tobacco Use  Smoking status: Never  Smokeless tobacco: Never Vaping Use  Vaping status: Never Used Substance and Sexual Activity  Alcohol use: Yes   Alcohol/week: 2.0 standard drinks of alcohol   Types: 2 Cans of beer per week   Comment: social drinker once a week  Drug use: Never  Sexual activity: Not on file Other Topics Concern  Not on file Social History Narrative  Not on file Social Drivers of Health Financial Resource Strain: Low Risk  (08/05/2021)  Overall Financial Resource Strain (CARDIA)   Difficulty of Paying Living Expenses: Not very hard Food Insecurity: No Food Insecurity (09/18/2022)  Hunger Vital Sign   Worried About Running Out of Food in the Last Year: Never true   Ran Out of Food in the Last Year: Never true Transportation Needs: No Transportation Needs (09/18/2022)  PRAPARE - Designer, jewellery (Medical): No   Lack of Transportation (Non-Medical): No Physical Activity: Not on file Stress: Not on file Social Connections: Not on file Intimate Partner Violence: Not on file Housing Stability: Low Risk  (09/18/2022)  Housing Stability   Housing Stability: I have a steady place to live   Housing Stability: Not on file EXAM:General Appearance:He is heavy set, appears wellHEENT:  Mucous membranes moist, No oral lesions, no erythema, teeth are adequate repair Lungs: ClearCor: RRR, no m/r/gAbd: soft nontender, no hepatosplenomegaly, Extr: trace edema.  Had a paronychia about the lateral aspect of left big toe with an ingrown toe nail. See picture below for more details. Looks less swollen today. Skin:no rash  Lymph Nodes - no palpable adenopathy. Neurologic: mentation appears normal, normal speech, strength symmetric. LABSLab Results Component Value Date  WBC 3.2 (L) 01/29/2023  HGB 11.4 (L) 01/29/2023  HCT 32.30 (L) 01/29/2023  MCV 98.5 01/29/2023  PLT 162 01/29/2023 Comprehensive Metabolic Panel:Lab Results Component Value Date  GLU 91 01/29/2023  BUN 20 01/29/2023  CREATININE 1.02 01/29/2023  NA 139 01/29/2023  K 3.8 01/29/2023  CL 107 01/29/2023  CO2 22 01/29/2023  ALBUMIN 3.5 (L) 01/29/2023  PROT 6.4 01/29/2023  BILITOT 0.4 01/29/2023  ALKPHOS 76 01/29/2023  ALT 23 01/29/2023  GLOB 2.9 01/29/2023  CALCIUM 8.8 01/29/2023 Results in Past 7 DaysResult Component Current Result LD 205 (01/29/2023) ]A/P:  TINY OBRYON is 73 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  His disease is characterized by minor cytopenias over a long period of observation with recently worsened anemia, thrombocytopenia, and a marrow with increased blasts consistent with disease progression. 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 Therapy  was   started with decitabine and venetoclax, and transplantation  recommended given the now increased risk of shorten survival. The patient has had a formal  search of the registry and a 10/10 match was identified.A/P: Day 127 no evidence of gvhd. 1) Disease - Bone marrow biopsy September 10th, this was consistent with remission by morphology. Donor percentage was 100%, FISH and gene panel are normal.  Bone marrow biopsy on 01/05/23 was consistent with remission, no increase in blasts, and donor percentage is 100%, the fish was 100% donor and the gene panel was negative. 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 Goal is tac 5-10, check weekly ldh, haptoglobin, triglycerides. tapered tacrolimus to 4 mg in the morning and 4 mg in the evening. 3) Heme - Donor, Recipient both blood type A+ve. Blood counts looks good today. 4) ID - Prophylaxis with acyclovir, pentamidine, and Cresemba. He is not at risk for CMV. 5) Hypertension - He is on continued Norvasc. 6) Liver: Continue Ursodiol, monitor mild LFT changes. Normal today. 7) History of skin cancer: Needs to be addressed after 3 month time point. Planned Mohs procedure on December 18th. 8) Abnormal spine, lesion on MRI pre- transplant: Repeated MRI on November 14th. The abnormal 3 cm area in L2 was stable and there was some subtle enhancement of the cauda equina nerve roots is noted on the sagittal postcontrast, of unclear significance enhancement and this was referred to as less conspicuous. 9) Floaters: The patient was evaluated by Dr Franklyn Lor, ophthalmologist. Plan to follow him in again in 6-8 weeks. 10) Paronychia in left big toe: Referred to podiatrist for further treatment. He completed Keflex 500 mg 4 times daily for 7 days with some improvement, follow up with Podiatry. Dr. Chrystine Oiler MD Scribed for Chrystine Oiler, MD by Simona Huh, medical scribe December 13, 2024The documentation recorded by the scribe accurately reflects the services I personally performed and the decisions made by me. I reviewed and confirmed all material entered and/or pre-charted by the scribe.

## 2023-03-09 ENCOUNTER — Encounter: Admit: 2023-03-09 | Payer: PRIVATE HEALTH INSURANCE | Attending: Medical Oncology

## 2023-03-09 NOTE — Progress Notes
 [facility]	Outpatient Clinic NoteDate: 1/14/2025Patient Name: Douglas [name] [facility]: [id number] Subjective: Douglas [name] is a 73 y.o. male with PMHx below presents to the [facility] podiatry clinic with chief complaint of thickening of skin around the left big toe nail. Patient stated that he is currently completing chemotherapy for CLL at [facility]. Patient lives in [place], will be leaving in 2 weeks. Patient stated that both big toenails are loose. Patient also stated that he walks into furniture frequently, patient wears spenco brand flip flops. Patient stated he began wearing closed toe shoes upon arrival to [place], tried Dr. Margart Sickles orthotics which were so uncomfortable he removed them from his sneaker, patient inquiring about orthotics. Patient denies any other complaints at this time. 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)   CLL (chronic lymphocytic leukemia) (HC Code)   Depression   DOE (dyspnea on exertion)   Enlarged prostate   Essential hypertension   Family history of ischemic heart disease   Glaucoma   Hypertensive heart disease without heart failure   Mixed hyperlipidemia   Past Surgical History: Procedure Laterality Date  ADENOIDECTOMY    BONE MARROW BIOPSY    PROSTATE BIOPSY    TONSILLECTOMY    VASCULAR SURGERY    Social History: Family History: Social History Tobacco Use  Smoking status: Never  Smokeless tobacco: Never Substance Use Topics  Alcohol use: Yes   Alcohol/week: 2.0 standard drinks of alcohol   Types: 2 Cans of beer per week   Comment: social drinker once a week  Family History Problem Relation Age of Onset  Breast cancer Mother   Liver cancer Mother   Glaucoma Father   Heart disease Father   Prostate cancer Father   Breast cancer Sister   Thyroid disease Sister   Hearing loss Sister   Diabetes Daughter   No Known Problems Daughter   No Known Problems Grandchild   ROS: Constitutional Symptoms: denies fever, chills, fatigue, unexplained fallsEyes: denies recent changes in vision, eye painEars/Nose/Mouth/Throat: denies congestionCardiovascular: denies chest pain, SOB, claudicationRespiratory: denies cough, wheeze, SOBGastrointestinal: denies abdominal painMusculoskeletal: denies pains at BLEIntegumentary: reports skin abnormalities at b/l hallux nailsNeurologic: denies dizziness, light-headedness; denies neuropathyAllergies/Immunologic: VoriconazolePhysical Exam: Vitals:  03/02/23 0901 BP: (!) 152/78 Pulse: (!) 94 Resp: 18 Temp: 97.4 ?F (36.3 ?C) Vascular: DP/PT pulses palpable. Normal CRT to all digits. Foot is normal to touch. Pedal hair growth is present.Neuro: Protective sensation is reduced via SWMF 8/10 bilateralDerm: Onycholysis of bilateral hallux nails. Discoloration, thickening, subungual debris. Left medial border hallux nail incurvated with skin callus formation. All 8 webspaces with maceration that is not coral red with Wood's lamp. MSK: Left 2nd digit rigid HT deformityImaging: IR Venous AccessResult Date: 12/18/2024PROCEDURE: Removal of right internal jugular vein tunneled catheter. DATE OF PROCEDURE: 02/03/23 INDICATION:34 years year-old Male with history of AML who presents for tunneled catheter removal due to no longer needed. PROCEDURALIST: Dossie Der, APRN MEDICATIONS: 1% local lidocaine anesthesia only. TECHNIQUE AND FINDINGS: Official time out was performed.  The patient's right upper anterior chest and right neck were sterilely prepped and draped, as was the indwelling catheter.  Lidocaine was administered to the tissues about the catheter and adjacent scar tissue in the anterior chest.  A blunt dissection was then used to expose the retention cuff and free it from the surrounding tissues.  The catheter  was then removed in its entirety under breath-hold technique.  Hemostasis was obtained after 5 minutes of manual compression.  A sterile dressing was applied.  The patient appeared to tolerate the procedure well. Removal of a right internal jugular vein tunneled Hickman catheter. Reported and signed by: Dossie Der, APRN  [facility]  Assessment & Plan: Douglas [name] is a 73 y.o. 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 Jacinto Reap Signed by Zenaida Deed, DPM, January 14, 2025I saw and evaluated Douglas [name] with the resident. I was present and supervised the resident during the procedure. I agree with the findings and the plan of care as documented in the resident note.Electronically Signed by Donn Pierini, DPM, March 02, 2023

## 2023-03-11 ENCOUNTER — Inpatient Hospital Stay: Admit: 2023-03-11 | Discharge: 2023-03-11 | Payer: MEDICARE

## 2023-03-11 ENCOUNTER — Ambulatory Visit: Admit: 2023-03-11 | Payer: MEDICARE

## 2023-03-11 ENCOUNTER — Encounter: Admit: 2023-03-11 | Payer: PRIVATE HEALTH INSURANCE | Attending: Vascular and Interventional Radiology

## 2023-03-11 VITALS — BP 151/74 | HR 62 | Temp 98.20000°F | Resp 16 | Wt 210.5 lb

## 2023-03-11 DIAGNOSIS — C9200 Acute myeloblastic leukemia, not having achieved remission: Secondary | ICD-10-CM

## 2023-03-11 DIAGNOSIS — Z9484 Stem cells transplant status: Secondary | ICD-10-CM

## 2023-03-11 DIAGNOSIS — Z01818 Encounter for other preprocedural examination: Secondary | ICD-10-CM

## 2023-03-11 DIAGNOSIS — Z7901 Long term (current) use of anticoagulants: Secondary | ICD-10-CM

## 2023-03-11 DIAGNOSIS — C9201 Acute myeloblastic leukemia, in remission: Secondary | ICD-10-CM

## 2023-03-11 DIAGNOSIS — Z9481 Bone marrow transplant status: Principal | ICD-10-CM

## 2023-03-11 LAB — COMPREHENSIVE METABOLIC PANEL
BKR A/G RATIO: 1.8 (ref 1.0–2.2)
BKR ALANINE AMINOTRANSFERASE (ALT): 23 U/L (ref 9–59)
BKR ALBUMIN: 4.3 g/dL (ref 3.6–5.1)
BKR ALKALINE PHOSPHATASE: 87 U/L (ref 9–122)
BKR ANION GAP: 16 (ref 7–17)
BKR ASPARTATE AMINOTRANSFERASE (AST): 24 U/L (ref 10–35)
BKR AST/ALT RATIO: 1
BKR BILIRUBIN TOTAL: 0.5 mg/dL (ref <=1.2)
BKR BLOOD UREA NITROGEN: 19 mg/dL (ref 8–23)
BKR BUN / CREAT RATIO: 18.3 (ref 8.0–23.0)
BKR CALCIUM: 8.8 mg/dL (ref 8.8–10.2)
BKR CHLORIDE: 103 mmol/L (ref 98–107)
BKR CO2: 20 mmol/L (ref 20–30)
BKR CREATININE DELTA: -0.05
BKR CREATININE: 1.04 mg/dL (ref 0.40–1.30)
BKR EGFR, CREATININE (CKD-EPI 2021): >60 mL/min/{1.73_m2} (ref >=60)
BKR GLOBULIN: 2.4 g/dL (ref 2.0–3.9)
BKR GLUCOSE: 99 mg/dL (ref 70–100)
BKR POTASSIUM: 3.8 mmol/L (ref 3.3–5.3)
BKR PROTEIN TOTAL: 6.7 g/dL (ref 5.9–8.3)
BKR SODIUM: 139 mmol/L (ref 136–144)

## 2023-03-11 LAB — CBC WITH AUTO DIFFERENTIAL
BKR WAM ABSOLUTE IMMATURE GRANULOCYTES.: 0.01 x 1000/ÂµL (ref 0.00–0.30)
BKR WAM ABSOLUTE LYMPHOCYTE COUNT.: 0.81 x 1000/ÂµL (ref 0.60–3.70)
BKR WAM ABSOLUTE NRBC (2 DEC): 0 x 1000/ÂµL (ref 0.00–1.00)
BKR WAM ANC (ABSOLUTE NEUTROPHIL COUNT): 3.14 x 1000/ÂµL (ref 2.00–7.60)
BKR WAM BASOPHIL ABSOLUTE COUNT.: 0.04 x 1000/ÂµL (ref 0.00–1.00)
BKR WAM BASOPHILS: 0.9 % (ref 0.0–1.4)
BKR WAM EOSINOPHIL ABSOLUTE COUNT.: 0.06 x 1000/ÂµL (ref 0.00–1.00)
BKR WAM EOSINOPHILS: 1.3 % (ref 0.0–5.0)
BKR WAM HEMATOCRIT (2 DEC): 37.7 % — ABNORMAL LOW (ref 38.50–50.00)
BKR WAM HEMOGLOBIN: 12.3 g/dL — ABNORMAL LOW (ref 13.2–17.1)
BKR WAM IMMATURE GRANULOCYTES: 0.2 % (ref 0.0–1.0)
BKR WAM LYMPHOCYTES: 17.6 % (ref 17.0–50.0)
BKR WAM MCH (PG): 33.2 pg — ABNORMAL HIGH (ref 27.0–33.0)
BKR WAM MCHC: 32.6 g/dL (ref 31.0–36.0)
BKR WAM MCV: 101.9 fL — ABNORMAL HIGH (ref 80.0–100.0)
BKR WAM MONOCYTE ABSOLUTE COUNT.: 0.55 x 1000/ÂµL (ref 0.00–1.00)
BKR WAM MONOCYTES: 11.9 % (ref 4.0–12.0)
BKR WAM MPV: 10.5 fL (ref 8.0–12.0)
BKR WAM NEUTROPHILS: 68.1 % (ref 39.0–72.0)
BKR WAM NUCLEATED RED BLOOD CELLS: 0 % (ref 0.0–1.0)
BKR WAM PLATELETS: 189 x1000/ÂµL (ref 150–420)
BKR WAM RDW-CV: 14.5 % (ref 11.0–15.0)
BKR WAM RED BLOOD CELL COUNT.: 3.7 M/ÂµL — ABNORMAL LOW (ref 4.00–6.00)
BKR WAM WHITE BLOOD CELL COUNT: 4.6 x1000/ÂµL (ref 4.0–11.0)

## 2023-03-11 LAB — IMMUNOGLOBULINS IGG, IGA, IGM
BKR IGA: 91 mg/dL (ref 70–470)
BKR IGG: 869 mg/dL (ref 700–1600)
BKR IGM: 41 mg/dL (ref 40–230)

## 2023-03-11 LAB — RETICULOCYTES
BKR WAM IRF: 12.5 % (ref 3.0–15.9)
BKR WAM RETICULOCYTE - ABS (3 DEC): 0.058 10Ë6 cells/uL (ref 0.023–0.140)
BKR WAM RETICULOCYTE COUNT PCT (1 DEC): 1.6 % (ref 0.6–2.7)
BKR WAM RETICULOCYTE HGB EQUIVALENT: 35.7 pg (ref 28.2–35.7)

## 2023-03-11 LAB — HAPTOGLOBIN: BKR HAPTOGLOBIN: 162 mg/dL (ref 30–200)

## 2023-03-11 LAB — PT/INR AND PTT (BH GH L LMW YH)
BKR INR: 0.98 (ref 0.86–1.13)
BKR PARTIAL THROMBOPLASTIN TIME: 25.9 s (ref 23.0–31.0)
BKR PROTHROMBIN TIME: 10.8 s (ref 9.6–12.3)

## 2023-03-11 LAB — PERIPHERAL SMEAR FOR PATHOLOGY     (YH)

## 2023-03-11 LAB — LACTATE DEHYDROGENASE: BKR LACTATE DEHYDROGENASE: 249 U/L — ABNORMAL HIGH (ref 122–241)

## 2023-03-11 LAB — CYTOMEGALOVIRUS QUANTITATIVE PCR, PLASMA     (BH GH L LMW YH): BKR CMV QUANTITATIVE PCR: NOT DETECTED

## 2023-03-11 LAB — TRIGLYCERIDES: BKR TRIGLYCERIDES: 179 mg/dL — ABNORMAL HIGH

## 2023-03-11 LAB — BONE MARROW SMEAR, ASPIRATION, AND STAIN      (L YH)

## 2023-03-11 LAB — PHOSPHORUS     (BH GH L LMW YH): BKR PHOSPHORUS: 3.2 mg/dL (ref 2.2–4.5)

## 2023-03-11 LAB — MAGNESIUM: BKR MAGNESIUM: 2.1 mg/dL (ref 1.7–2.4)

## 2023-03-11 NOTE — Progress Notes
 Bone Marrow Aspiration/Biopsy Procedure NoteINFORMED CONSENT & TIME OUTThe patient was identified by the following methods (need 2):Name/DOBVerbal with patient and/or familyPotential risks including bleeding, infection and pain were reviewed with the patient.The following procedure verification steps were taken: Consent for procedure signedRelevant documentation completed, reviewed, and signedClinical indications for procedure confirmed Site Marked: Not applicableTime-out was taken with all members of procedure team immediately prior to procedure and the following were confirmed: Correct patient identifiedAgreement on procedureCorrect side and siteCorrect patient positionAvailability of correct implant/equipmentPROCEDURE PERFORMED:  Bone Marrow Aspirate & BiopsySITE PERFORMED:  right posterior iliac crest, prepped with Betadine using sterile technique.LOCAL ANESTHESIA: 1% plain lidocaine subcutaneous injection, 10 ml administered.ORDERING PHYSICIAN:         Seropian                   INDICATIONS:  post bone marrow transplant evaluationPATIENT POSITION: Patient was placed in the prone position.ESTIMATED BLOOD LOSS:  Minimal blood loss.  Hemostasis was maintained by direct pressure to the external puncture site.  A sterile, dry pressure bandage was applied.POST PROCEDURE PAIN LEVEL:  nonePATIENT CONDITION POST PROCEDURE: stableCOMPLICATIONS: Patient tolerated the procedure well and no complications were noted.SPECIMENS: fish, flow, chimerism, chromosomes, ccsPATIENT INSTRUCTIONS: Resume normal activity unless significant discomfort or otherwise instructed.Maintain pressure dressing dry and intact for 24 hours.Dressing may be removed after 24 hours and replace with a Band-Aid if needed.May shower after 24 hours.For the first 48 hours the site should not be submerged in water.Ice packs to site(s) as needed for discomfort.Acetaminophen 1-2 tabs every 6 hours as needed for discomfort.Elenore Rota, [id number] or MHB Barile-Maisee Vollman

## 2023-03-11 NOTE — Progress Notes
 Day +168 post MUD for his AML.Reports good energy and good appetite.No fever/chills; no nausea/vomiting/diarrhea.Skin intact; has mild rash/dry skin on chest.No SOB; no cough nor nasal congestion.Labs drawn peripherally.Seen by Amy and tolerated bonemarrow biopsy done by same APP after timeout at bedside.Vital signs stable after the procedure.Discharged with his wife.Dressing to his lower back intact.RTC on February 6 for labs/follow up with Margurite Auerbach A, and pentamidine therapy.

## 2023-03-12 LAB — MDS PLUS PANEL
BKR CD10+CD33+: 0.9 %
BKR CD10+CD34+: 6.9 %
BKR CD11B+CD16+: 58.9 %
BKR CD13+CD11B+: 75.2 %
BKR CD14+CD33+: 69.6 %
BKR CD14-CD64+: 18.3 %
BKR CD19+: 46.4 %
BKR CD19+CD38+: 45.5 %
BKR CD19+KAPPA+ MONO GATE: 0.1 %
BKR CD19+KAPPA+ MYELOID GATE: 0.1 %
BKR CD19+KAPPA+: 16.8 %
BKR CD19+LAMBDA+ MONO GATE: 0.2 %
BKR CD19+LAMBDA+ MYELOID GATE: 0.1 %
BKR CD19+LAMBDA+: 8.2 %
BKR CD3+CD16/56+: 0.6 %
BKR CD3+CD16/56-: 17.7 %
BKR CD3+CD4+ MONO GATE: 2.6 %
BKR CD3+CD4+: 9.8 %
BKR CD3+CD4+CD8+: 0.1 %
BKR CD3+CD5+ MONO GATE: 1.1 %
BKR CD3+CD5+ MYELOID GATE: 0.1 %
BKR CD3+CD5+: 17.8 %
BKR CD3+CD8+ MONO GATE: 0.8 %
BKR CD3+CD8+: 7 %
BKR CD3-CD16/56+: 27.3 %
BKR CD3-CD4+: 54.6 %
BKR CD33+ MONO GATE: 84.3 %
BKR CD33+: 8.3 %
BKR CD33+CD10+: 0.5 %
BKR CD33+CD34+: 3.9 %
BKR CD33+HLADR-: 1.5 %
BKR CD34+: 10.8 %
BKR CD34+CD10+: 0.2 %
BKR CD34+CD117+ MONO GATE: 1 %
BKR CD34+CD117+: 2 %
BKR CD34+CD33+ MONO GATE: 0.9 %
BKR CD34+CD64+ MONO GATE: 0.2 %
BKR CD34+CD64+: 0.9 %
BKR CD34+HLADR+: 8.7 %
BKR CD4/CD8 RATIO: 1.4
BKR CD45+CD10+CD33-: 32.1 %
BKR CD45+CD10-CD33+: 3.3 %
BKR CD45+CD11B+CD13+: 69.3 %
BKR CD45+CD14+CD64+: 1.1 %
BKR CD45+CD16+CD11B+: 67.7 %
BKR CD45+CD16.56+CD3-: 20.2 %
BKR CD45+CD33+HLADR+: 3.6 %
BKR CD45+CD34+CD10+: 0.1 %
BKR CD45+CD34+CD117+: 0.4 %
BKR CD45+CD34+CD33+: 0.4 %
BKR CD45+CD34+CD7+: 0.1 %
BKR CD45+CD56+CD38-: 0.1 %
BKR CD56+: 0.2 %
BKR CD56+CD38+: 6.7 %
BKR CD64+: 84.2 %
BKR CD64+CD14+ MONO GATE: 66.8 %
BKR CD64+CD14+: 1.3 %
BKR CD7+CD117+: 1.3 %
BKR CD7+CD117- MONO GATE: 10.3 %
BKR CD7+CD117-: 37.3 %
BKR HLADR+CD33+: 80.3 %

## 2023-03-12 LAB — LYMPHOCYTE SUBSETS, TOTAL TBNK (BH GH LMW YH)
BK CD3-CD16/56+ ABS LYMPH COUNT: 286 /uL (ref 127–785)
BKR CD19+ ABS LYMPH COUNT: 202 /uL (ref 40–474)
BKR CD19+ LYMPH: 24.05 % (ref 3.17–26.01)
BKR CD3+ ABS LYMPH COUNT: 334 /uL — ABNORMAL LOW (ref 599–2401)
BKR CD3+ LYMPH: 39.74 % — ABNORMAL LOW (ref 52.81–84.29)
BKR CD3+/CD4+ LYMPH (EXCL.DUAL POS.): 24.98 % — ABNORMAL LOW (ref 26.95–64.80)
BKR CD3+/CD8+LYMPH (EXCL.DUAL POS.): 11.26 % (ref 7.52–43.41)
BKR CD3+CD4+ ABS LYMPH COUNT (EXCL.DUAL POS.): 210 /uL — ABNORMAL LOW (ref 363–1592)
BKR CD3+CD4+CD8+  LYMPH: 0.2 % — ABNORMAL LOW (ref 0.23–5.31)
BKR CD3+CD4+CD8+ ABSOLUTE LYMPH COUNT: 2 /uL — ABNORMAL LOW (ref 3–126)
BKR CD3+CD4-CD8- ABSOLUTE LYMPH COUNT: 28 /uL (ref 8–113)
BKR CD3+CD4-CD8- LYMPH: 3.3 % (ref 0.42–5.95)
BKR CD3+CD8+ ABS LYMPH COUNT (EXCL.DUAL POS.): 95 /uL — ABNORMAL LOW (ref 99–1051)
BKR CD3-CD16/56+ LYMPH: 34.05 % — ABNORMAL HIGH (ref 5.56–33.68)
BKR CD4/CD8 LYMPH RATIO (EXCL.DUAL POS.): 2.22 (ref 0.62–7.79)
BKR CD45+ ABS LYMPH COUNT: 840 /uL — ABNORMAL LOW (ref 967–3023)
BKR TOTAL LYMPH EVENTS: 4059

## 2023-03-12 LAB — FLOW CYTOMETRY - LEUKEMIA/LYMPHOMA/NEOPLASIA, BONE MARROW

## 2023-03-19 DIAGNOSIS — Z79899 Other long term (current) drug therapy: Secondary | ICD-10-CM | POA: Diagnosis not present

## 2023-03-19 DIAGNOSIS — Z125 Encounter for screening for malignant neoplasm of prostate: Secondary | ICD-10-CM | POA: Diagnosis not present

## 2023-03-19 DIAGNOSIS — Z0000 Encounter for general adult medical examination without abnormal findings: Secondary | ICD-10-CM | POA: Diagnosis not present

## 2023-03-19 DIAGNOSIS — N401 Enlarged prostate with lower urinary tract symptoms: Secondary | ICD-10-CM | POA: Diagnosis not present

## 2023-03-19 DIAGNOSIS — E039 Hypothyroidism, unspecified: Secondary | ICD-10-CM | POA: Diagnosis not present

## 2023-03-19 DIAGNOSIS — E782 Mixed hyperlipidemia: Secondary | ICD-10-CM | POA: Diagnosis not present

## 2023-03-19 DIAGNOSIS — I10 Essential (primary) hypertension: Secondary | ICD-10-CM | POA: Diagnosis not present

## 2023-03-19 DIAGNOSIS — R0600 Dyspnea, unspecified: Secondary | ICD-10-CM | POA: Diagnosis not present

## 2023-03-20 ENCOUNTER — Telehealth: Admit: 2023-03-20 | Payer: PRIVATE HEALTH INSURANCE | Attending: Medical

## 2023-03-20 NOTE — Telephone Encounter
 ON CALL APP TELEPHONE NOTES/p allogeneic stem cell transplantReports cough and some nasal congestion clear mucus, started 2 days ago and overall feeling weaker this morning had not been eating or drinking over the past 24 hours, reports cough has improved some already, feels mild sore throat, after drinking fluids he has been able to walk around better. Wife states this morning had trouble getting up stairs because felt generalized fatigue which has improved. Temperature 99.53F,  BP147/76 mmHg. Denies SOB, CP, vomiting, diarrhea, pain, focal weakness, headaches, falls, or any other associated symptoms.Recommended to increase hydration and food intake, take COVID test, since symptoms are improving supportive care recommended. Recent labs wnl 03/10/22 not neutropenic. If worsening, new, or persisting symptoms advised to call back sooner such as fever, worsening cough and ER precautions if focal weakness, SOB, or neurologic changes advised.

## 2023-03-22 LAB — ENGRAFTMENT/CHIMERISM POST, BONE MARROW     (YH): BKR % DONOR CELLS: 100 %

## 2023-03-23 NOTE — Progress Notes
 New floaters OD- plan in person dilation in 6-8 weeks- reviewed S/Sx of RD and pt aware to return STATVIDEO TELEHEALTH VISIT: This clinician is part of the telehealth program and is conducting this visit in a currently approved location. For this visit the clinician and patient were present via interactive audio & video telecommunications system that permits real-time communications, via the [employer].Patient's use of the telehealth platform followed consent and acknowledges agreement to permit telehealth for this visit. State patient is located in: [place] clinician is appropriately licensed in the above state to provide care for this visit. Other individuals present during the telehealth encounter and their role/relation: noneBecause this visit was completed over video, a hands-on physical exam was not performed. Patient/parent or guardian understands and knows to call back if condition changes.

## 2023-03-24 ENCOUNTER — Encounter: Admit: 2023-03-24 | Payer: PRIVATE HEALTH INSURANCE | Attending: Adult Health

## 2023-03-24 ENCOUNTER — Inpatient Hospital Stay: Admit: 2023-03-24 | Discharge: 2023-03-24 | Payer: MEDICARE

## 2023-03-24 ENCOUNTER — Ambulatory Visit: Admit: 2023-03-24 | Payer: MEDICARE | Attending: Adult Health

## 2023-03-24 VITALS — BP 150/76 | HR 71 | Temp 97.20000°F | Resp 15 | Ht 74.0 in | Wt 205.7 lb

## 2023-03-24 DIAGNOSIS — I119 Hypertensive heart disease without heart failure: Secondary | ICD-10-CM

## 2023-03-24 DIAGNOSIS — R0981 Nasal congestion: Secondary | ICD-10-CM

## 2023-03-24 DIAGNOSIS — Z8616 Personal history of COVID-19: Secondary | ICD-10-CM

## 2023-03-24 DIAGNOSIS — Z8349 Family history of other endocrine, nutritional and metabolic diseases: Secondary | ICD-10-CM

## 2023-03-24 DIAGNOSIS — Z9484 Stem cells transplant status: Secondary | ICD-10-CM

## 2023-03-24 DIAGNOSIS — R059 Cough, unspecified: Secondary | ICD-10-CM

## 2023-03-24 DIAGNOSIS — C931 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: Secondary | ICD-10-CM

## 2023-03-24 DIAGNOSIS — Z79624 Long term (current) use of inhibitors of nucleotide synthesis: Secondary | ICD-10-CM

## 2023-03-24 DIAGNOSIS — J069 Acute upper respiratory infection, unspecified: Secondary | ICD-10-CM

## 2023-03-24 DIAGNOSIS — Z85828 Personal history of other malignant neoplasm of skin: Secondary | ICD-10-CM

## 2023-03-24 DIAGNOSIS — D509 Iron deficiency anemia, unspecified: Secondary | ICD-10-CM

## 2023-03-24 DIAGNOSIS — Z8249 Family history of ischemic heart disease and other diseases of the circulatory system: Secondary | ICD-10-CM

## 2023-03-24 DIAGNOSIS — R0609 Other forms of dyspnea: Secondary | ICD-10-CM

## 2023-03-24 DIAGNOSIS — Z79899 Other long term (current) drug therapy: Secondary | ICD-10-CM

## 2023-03-24 DIAGNOSIS — N400 Enlarged prostate without lower urinary tract symptoms: Secondary | ICD-10-CM

## 2023-03-24 DIAGNOSIS — Z9089 Acquired absence of other organs: Secondary | ICD-10-CM

## 2023-03-24 DIAGNOSIS — E782 Mixed hyperlipidemia: Secondary | ICD-10-CM

## 2023-03-24 DIAGNOSIS — Z9481 Bone marrow transplant status: Secondary | ICD-10-CM

## 2023-03-24 DIAGNOSIS — Z883 Allergy status to other anti-infective agents status: Secondary | ICD-10-CM

## 2023-03-24 DIAGNOSIS — C9110 Chronic lymphocytic leukemia of B-cell type not having achieved remission: Secondary | ICD-10-CM

## 2023-03-24 DIAGNOSIS — H409 Unspecified glaucoma: Secondary | ICD-10-CM

## 2023-03-24 DIAGNOSIS — F32A Depression: Secondary | ICD-10-CM

## 2023-03-24 DIAGNOSIS — I10 Essential (primary) hypertension: Secondary | ICD-10-CM

## 2023-03-24 DIAGNOSIS — Z20828 Contact with and (suspected) exposure to other viral communicable diseases: Secondary | ICD-10-CM

## 2023-03-24 DIAGNOSIS — C4491 Basal cell carcinoma of skin, unspecified: Secondary | ICD-10-CM

## 2023-03-24 DIAGNOSIS — F419 Anxiety disorder, unspecified: Secondary | ICD-10-CM

## 2023-03-24 LAB — COMPREHENSIVE METABOLIC PANEL
BKR A/G RATIO: 2 (ref 1.0–2.2)
BKR ALANINE AMINOTRANSFERASE (ALT): 33 U/L (ref 9–59)
BKR ALBUMIN: 4.1 g/dL (ref 3.6–5.1)
BKR ALKALINE PHOSPHATASE: 70 U/L (ref 9–122)
BKR ANION GAP: 12 (ref 7–17)
BKR ASPARTATE AMINOTRANSFERASE (AST): 32 U/L (ref 10–35)
BKR AST/ALT RATIO: 1
BKR BILIRUBIN TOTAL: 0.5 mg/dL (ref <=1.2)
BKR BLOOD UREA NITROGEN: 14 mg/dL (ref 8–23)
BKR BUN / CREAT RATIO: 13.9 (ref 8.0–23.0)
BKR CALCIUM: 8.7 mg/dL — ABNORMAL LOW (ref 8.8–10.2)
BKR CHLORIDE: 104 mmol/L (ref 98–107)
BKR CO2: 24 mmol/L (ref 20–30)
BKR CREATININE DELTA: -0.03
BKR CREATININE: 1.01 mg/dL (ref 0.40–1.30)
BKR EGFR, CREATININE (CKD-EPI 2021): >60 mL/min/{1.73_m2} (ref >=60)
BKR GLOBULIN: 2.1 g/dL (ref 2.0–3.9)
BKR GLUCOSE: 96 mg/dL (ref 70–100)
BKR POTASSIUM: 3.6 mmol/L (ref 3.3–5.3)
BKR PROTEIN TOTAL: 6.2 g/dL (ref 5.9–8.3)
BKR SODIUM: 140 mmol/L (ref 136–144)

## 2023-03-24 LAB — CBC WITH AUTO DIFFERENTIAL
BKR WAM ABSOLUTE IMMATURE GRANULOCYTES.: 0 x 1000/ÂµL (ref 0.00–0.30)
BKR WAM ABSOLUTE LYMPHOCYTE COUNT.: 0.84 x 1000/ÂµL (ref 0.60–3.70)
BKR WAM ABSOLUTE NRBC (2 DEC): 0 x 1000/ÂµL (ref 0.00–1.00)
BKR WAM ANC (ABSOLUTE NEUTROPHIL COUNT): 0.98 x 1000/ÂµL — ABNORMAL LOW (ref 2.00–7.60)
BKR WAM BASOPHIL ABSOLUTE COUNT.: 0.01 x 1000/ÂµL (ref 0.00–1.00)
BKR WAM BASOPHILS: 0.4 % (ref 0.0–1.4)
BKR WAM EOSINOPHIL ABSOLUTE COUNT.: 0.01 x 1000/ÂµL (ref 0.00–1.00)
BKR WAM EOSINOPHILS: 0.4 % (ref 0.0–5.0)
BKR WAM HEMATOCRIT (2 DEC): 36 % — ABNORMAL LOW (ref 38.50–50.00)
BKR WAM HEMOGLOBIN: 11.7 g/dL — ABNORMAL LOW (ref 13.2–17.1)
BKR WAM IMMATURE GRANULOCYTES: 0 % (ref 0.0–1.0)
BKR WAM LYMPHOCYTES: 37 % (ref 17.0–50.0)
BKR WAM MCH (PG): 32.7 pg (ref 27.0–33.0)
BKR WAM MCHC: 32.5 g/dL (ref 31.0–36.0)
BKR WAM MCV: 100.6 fL — ABNORMAL HIGH (ref 80.0–100.0)
BKR WAM MONOCYTE ABSOLUTE COUNT.: 0.43 x 1000/ÂµL (ref 0.00–1.00)
BKR WAM MONOCYTES: 18.9 % — ABNORMAL HIGH (ref 4.0–12.0)
BKR WAM MPV: 9.9 fL (ref 8.0–12.0)
BKR WAM NEUTROPHILS: 43.3 % (ref 39.0–72.0)
BKR WAM NUCLEATED RED BLOOD CELLS: 0 % (ref 0.0–1.0)
BKR WAM PLATELETS: 148 x1000/ÂµL — ABNORMAL LOW (ref 150–420)
BKR WAM RDW-CV: 13.7 % (ref 11.0–15.0)
BKR WAM RED BLOOD CELL COUNT.: 3.58 M/ÂµL — ABNORMAL LOW (ref 4.00–6.00)
BKR WAM WHITE BLOOD CELL COUNT: 2.3 x1000/ÂµL — ABNORMAL LOW (ref 4.0–11.0)

## 2023-03-24 LAB — RESPIRATORY VIRUS PCR PANEL     (BH GH LMW Q YH)
BKR ADENOVIRUS: NOT DETECTED
BKR BORDETELLA PARAPERTUSSIS: NOT DETECTED
BKR BORDETELLA PERTUSSIS: NOT DETECTED
BKR CHLAMYDIA PNEUMONIAE: NOT DETECTED
BKR CORONAVIRUS 229E: NOT DETECTED
BKR CORONAVIRUS HKU1: NOT DETECTED
BKR CORONAVIRUS NL63: NOT DETECTED
BKR CORONAVIRUS OC43: NOT DETECTED
BKR HUMAN METAPNEUMOVIRUS (HMPV): NOT DETECTED
BKR INFLUENZA A 2009 H1N1: DETECTED — AB
BKR INFLUENZA A: DETECTED — AB
BKR INFLUENZA B: NOT DETECTED
BKR MYCOPLASMA PNEUMONIAE: NOT DETECTED
BKR PARAINFLUENZA VIRUS 1: NOT DETECTED
BKR PARAINFLUENZA VIRUS 2: NOT DETECTED
BKR PARAINFLUENZA VIRUS 3: NOT DETECTED
BKR PARAINFLUENZA VIRUS 4: NOT DETECTED
BKR RESPIRATORY SYNCYTIAL VIRUS: NOT DETECTED
BKR RHINOVIRUS/ENTEROVIRUS: NOT DETECTED
BKR SARS-COV-2 RNA (COVID-19) (YH): NOT DETECTED

## 2023-03-24 LAB — LIPID PANEL
BKR CHOLESTEROL/HDL RATIO: 5.9 — ABNORMAL HIGH (ref 0.0–5.0)
BKR CHOLESTEROL: 206 mg/dL — ABNORMAL HIGH
BKR HDL CHOLESTEROL: 35 mg/dL — ABNORMAL LOW (ref >=40)
BKR LDL CHOLESTEROL SAMPSON CALCULATED: 153 mg/dL — ABNORMAL HIGH
BKR TRIGLYCERIDES: 98 mg/dL

## 2023-03-24 LAB — TACROLIMUS LEVEL     (BH GH L LMW YH): BKR TACROLIMUS BLOOD: <0.8 ng/mL — ABNORMAL LOW

## 2023-03-24 LAB — MAGNESIUM: BKR MAGNESIUM: 2.1 mg/dL (ref 1.7–2.4)

## 2023-03-24 LAB — CYTOMEGALOVIRUS QUANTITATIVE PCR, PLASMA     (BH GH L LMW YH): BKR CMV QUANTITATIVE PCR: NOT DETECTED

## 2023-03-24 MED ORDER — FOLIC ACID 1 MG TABLET
1 | ORAL_TABLET | Freq: Every day | ORAL | 12 refills | 30.00 days | Status: AC
Start: 2023-03-24 — End: ?
  Filled 2023-03-25: qty 30, 30d supply, fill #0

## 2023-03-24 MED ORDER — LEVOFLOXACIN 750 MG TABLET
750 | ORAL_TABLET | Freq: Every day | ORAL | 1 refills | Status: AC
Start: 2023-03-24 — End: ?

## 2023-03-24 MED ORDER — LISINOPRIL 10 MG TABLET
10 | ORAL_TABLET | Freq: Every day | ORAL | 1 refills | Status: AC
Start: 2023-03-24 — End: ?

## 2023-03-24 MED ORDER — TRIAMCINOLONE ACETONIDE 0.1 % TOPICAL CREAM
0.1 | Freq: Two times a day (BID) | TOPICAL | 1 refills | Status: AC
Start: 2023-03-24 — End: ?

## 2023-03-24 MED ORDER — OSELTAMIVIR 75 MG CAPSULE
75 | ORAL_CAPSULE | Freq: Two times a day (BID) | ORAL | 1 refills | Status: AC
Start: 2023-03-24 — End: ?

## 2023-03-24 NOTE — Progress Notes
 Pt not seen in infusion, resp panel collected by PN. Pentam held today

## 2023-03-25 ENCOUNTER — Ambulatory Visit: Admit: 2023-03-25 | Payer: MEDICARE

## 2023-03-25 ENCOUNTER — Encounter: Admit: 2023-03-25 | Payer: PRIVATE HEALTH INSURANCE | Attending: Adult Health

## 2023-03-25 ENCOUNTER — Ambulatory Visit: Admit: 2023-03-25 | Payer: MEDICARE | Attending: Adult Health

## 2023-03-25 DIAGNOSIS — Z9481 Bone marrow transplant status: Secondary | ICD-10-CM

## 2023-03-25 DIAGNOSIS — Z9484 Stem cells transplant status: Secondary | ICD-10-CM

## 2023-03-25 LAB — T LYMPHOCYTE SUBSETS  (BH GH LMW YH)
BKR CD3+ ABS LYMPH COUNT: 295 /uL — ABNORMAL LOW (ref 599–2401)
BKR CD3+ LYMPH: 32.01 % — ABNORMAL LOW (ref 52.81–84.29)
BKR CD3+/CD4+ LYMPH (EXCL.DUAL POS.): 21.35 % — ABNORMAL LOW (ref 26.95–64.80)
BKR CD3+/CD8+LYMPH (EXCL.DUAL POS.): 8.48 % (ref 7.52–43.41)
BKR CD3+CD4+ ABS LYMPH COUNT (EXCL.DUAL POS.): 197 /uL — ABNORMAL LOW (ref 363–1592)
BKR CD3+CD4+CD8+  LYMPH: 0.53 % (ref 0.23–5.31)
BKR CD3+CD4+CD8+ ABSOLUTE LYMPH COUNT: 5 /uL (ref 3–126)
BKR CD3+CD4-CD8- ABSOLUTE LYMPH COUNT: 15 /uL (ref 8–113)
BKR CD3+CD4-CD8- LYMPH: 1.65 % (ref 0.42–5.95)
BKR CD3+CD8+ ABS LYMPH COUNT (EXCL.DUAL POS.): 78 /uL — ABNORMAL LOW (ref 99–1051)
BKR CD4/CD8 LYMPH RATIO (EXCL.DUAL POS.): 2.52 (ref 0.62–7.79)
BKR CD45+ ABS LYMPH COUNT: 923 /uL — ABNORMAL LOW (ref 967–3023)
BKR TOTAL LYMPH EVENTS: 3949

## 2023-03-25 LAB — CCS, HEME     (YH)

## 2023-03-25 NOTE — Progress Notes
 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 Sherrell Puller INTERIM HISTORY: Douglas [name] is days 182  post transplant. - recent bone marrow biopsy with amy.History of Present Douglas [name] is a 73 year old male with a history of stem cell transplant who presents with symptoms of a cold. He is accompanied by his caregive He has been experiencing symptoms of a cold for the past four days, including a persistent cough with white sputum production, nasal congestion, and rhinorrhea. No fever or dyspnea. He slept in a recliner last night due to increased coughing when supine.He has a history of stem cell transplant and is currently off tacrolimus. He is concerned about his immune system status, noting a slightly low neutrophil count. He is eager to return to [place] but understands the need for close health monitoring due to his recent transplant.He is taking amlodipine 10 mg once daily for hypertension and has been advised to start lisinopril at a lower dose. He also takes folic acid to support erythropoiesis and inquires about continuing multivitamins and folic acid, which he has been taking since his stem cell transplant.He mentions a non-itchy rash on his chest, possibly related to xerosis, and has been using moisturizer on it.He is concerned about his cholesterol levels and mentions previous use of atorvastatin. He also discusses his blood pressure management, noting a recent elevation to 147 mmHg systolic. HPI:   Douglas [name] has a hx of Hypertension, hyperlipidemia, basal cell cancer of the skin, iron deficiency anemia.  The patient was evaluated for cytopenias in 2021 in [place].  Initial evaluation from [facility], source records are not available today.  Per the patient's subsequent records, he initially presented with leukopenia, thrombocytopenia, and monocytosis.  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  Initial bone marrow was performed on August 24, 2019, was reviewed at [facility] with findings of multilineage dysplasia.  The marrow was 40% cellular.  Megakaryocytes showed frequent dysplasia with  hyperchromatic forms and clustering.  Blasts were not increased.  A FISH panel for MDS  loci was negative.  Karyotype was normal. 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  The patient was observed and was without any treatment or transfusions.  In 2023, he had an acute drop in hemoglobin, was found to be iron deficient.  Workup for this did not show a clear focus of blood loss or bleeding.  The patient has continued evaluation and established care with Dr. Sherrell Puller in June 2023 as he spends some summers in [place].  Of note, there is a label of CLL in the patient's chart, which I suspect was a typo, and find no evidence that support that diagnosis.  Dr. Dema Severin assessment in June 2023 per the CPSS molecular score being considered intermediate one with a low risk of development of leukemia and median survival over 5 years.  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  The patient had a marrow performed at [facility] on 29th with CD34 stain showing 10% to 15% blasts focally more than 20%.  Marrow was hypercellular at 70%.  Dysplastic megakaryocytes again appreciated.  Molecular studies are pending.  Given the  findings, treatment with the decitabine and venetoclax was recommended.  A referral has been placed for urgent EGD colonoscopy given the low iron concern for occult source of bleeding.He started treatment last week which he is tolerated well. He received 5-6 red blood cell transfusion in the past several month. He was suffering weakness and dyspnea which is now improved after transfusion. He does not have a significant infectious history, he did have COVID-19 in the past from which he recovered. He has hypertension and hyperlipidemia managed by cardiologist in [place]. He reports a negative stress test in the past. He has had number of skin cancers and has had several MOHS procedures. He is followed with a urologist for some BPH and abnormal PSA. He has had 2 prostate biopsies which was negative. He carries a diagnosis of iron deficiency and require transfusion in [place] after a significant and acute drop in hemoglobin. There was no clinical evidence of bleeding. Endoscopy and colonoscopy are planned to exclude a source of bleeding. Douglas [name] is a 73 yo gentlemen that lives with is wife in [place].  He is currently spending the summer in [place] visiting family.  He has 2 daughters and 5 grandchildren.  Retired, having worked for [employer].  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  He is a social drinker of 1-2 beers per week, denies illicit drug use and is a non smoker.  Covid-19 vaccine series completed. 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)   CLL (chronic lymphocytic leukemia) (HC Code)   Depression   DOE (dyspnea on exertion)   Enlarged prostate   Essential hypertension   Family history of ischemic heart disease   Glaucoma   Hypertensive heart disease without heart failure   Mixed hyperlipidemia  Surgical HistoryPast Surgical History: Procedure Laterality Date  ADENOIDECTOMY    BONE MARROW BIOPSY    PROSTATE BIOPSY    TONSILLECTOMY    VASCULAR SURGERY   AllergiesAllergies Allergen Reactions  Voriconazole Other (See Comments)   Gingival edema/redness making it difficult to bite down  Current Outpatient Medications:   acyclovir (ZOVIRAX) 400 mg tablet, Take 1 tablet (400 mg total) by mouth every 12 (twelve) hours., Disp: 180 tablet, Rfl: 3  amLODIPine (NORVASC) 10 mg tablet, Take 1 tablet (10 mg total) by mouth daily., Disp: , Rfl:   finasteride (PROSCAR) 5 mg tablet, TAKE 1 TABLET BY MOUTH DAILY FOR ENLARGED PROSTATE WITH URINATION PROBLEM TAKE WITH OR WITHOUT MEALS, Disp: , Rfl:   folic acid (FOLVITE) 1 mg tablet, Take 1 tablet (1 mg total) by mouth daily., Disp: 30 tablet, Rfl: 11  latanoprost (XALATAN) 0.005 % ophthalmic solution, Place 1 drop into both eyes nightly., Disp: , Rfl:   levoFLOXacin (LEVAQUIN) 750 mg tablet, Take 1 tablet (750 mg total) by mouth daily. POST VIRAL PNA PROPHYLAXIS, Disp: 10 tablet, Rfl: 0  lisinopriL (PRINIVIL,ZESTRIL) 10 mg tablet, Take 1 tablet (10 mg total) by mouth daily., Disp: 30 tablet, Rfl: 0  Miscellaneous Medical Supply, Please provide rolling walker, Use as directed., Disp: 1 each, Rfl: 0  multivitamin tablet, Take 1 tablet by mouth daily., Disp: 30 tablet, Rfl: 11  ondansetron (ZOFRAN) 8 mg tablet, Take  1 tablet (8 mg total) by mouth every 8 (eight) hours as needed for nausea or vomiting. (Patient not taking: Reported on 12/16/2022), Disp: 30 tablet, Rfl: 2  oseltamivir (TAMIFLU) 75 mg capsule, Take 1 capsule (75 mg total) by mouth 2 (two) times daily for 5 days. FLU TREATMENT, Disp: 10 capsule, Rfl: 0  pentamidine (PENTAM) 300 mg inhalation solution, Inhale 6 mLs (300 mg total) into the lungs every 28 days. 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 (HC Code)   CLL (chronic lymphocytic leukemia) (HC Code)   Depression   DOE (dyspnea on exertion)   Enlarged prostate   Essential hypertension   Family history of ischemic heart disease   Glaucoma   Hypertensive heart disease without heart failure   Mixed hyperlipidemia  Family History Problem Relation Age of Onset  Breast cancer Mother   Liver cancer Mother   Glaucoma Father   Heart disease Father   Prostate cancer Father   Breast cancer Sister   Thyroid disease Sister   Hearing loss Sister   Diabetes Daughter   No Known Problems Daughter   No Known Problems Grandchild  Social History Socioeconomic History  Marital status: Married   Spouse name: Not on file  Number of children: Not on file  Years of education: Not on file  Highest education level: Not on file Occupational History  Not on file Tobacco Use  Smoking status: Never  Smokeless tobacco: Never Vaping Use  Vaping status: Never Used Substance and Sexual Activity  Alcohol use: Yes   Alcohol/week: 2.0 standard drinks of alcohol   Types: 2 Cans of beer per week   Comment: social drinker once a week  Drug use: Never  Sexual activity: Not on file Other Topics Concern  Not on file Social History Narrative  Not on file Social Drivers of Health Financial Resource Strain: Low Risk  (08/05/2021)  Overall Financial Resource Strain (CARDIA)   Difficulty of Paying Living Expenses: Not very hard Food Insecurity: No Food Insecurity (09/18/2022)  Hunger Vital Sign   Worried About Running Out of Food in the Last Year: Never true   Ran Out of Food in the Last Year: Never true Transportation Needs: No Transportation Needs (09/18/2022)  PRAPARE - Designer, jewellery (Medical): No   Lack of Transportation (Non-Medical): No Physical Activity: Not on file Stress: Not on file Social Connections: Not on file Intimate Partner Violence: Not on file Housing Stability: Low Risk  (09/18/2022)  Housing Stability   Housing Stability: I have a steady place to live   Housing Stability: Not on file EXAM: General Appearance:He is heavy set, appears wellHEENT:  Mucous membranes moist, No oral lesions, no erythema, teeth are adequate repair Lungs: ClearCor: RRR, no m/r/gAbd: soft nontender, no hepatosplenomegaly, Extr: trace edema.  Had a paronychia about the lateral aspect of left big toe with an ingrown toe nail. See picture below for more details. Looks less swollen today. Skin:no rash  Lymph Nodes - no palpable adenopathy. Neurologic: mentation appears normal, normal speech, strength symmetric. LABSLab Results Component Value Date  WBC 2.3 (L) 03/24/2023  HGB 11.7 (L)  03/24/2023  HCT 36.00 (L) 03/24/2023  MCV 100.6 (H) 03/24/2023  PLT 148 (L) 03/24/2023 Comprehensive Metabolic Panel:Lab Results Component Value Date  GLU 96 03/24/2023  BUN 14 03/24/2023  CREATININE 1.01 03/24/2023  NA 140 03/24/2023  K 3.6 03/24/2023  CL 104 03/24/2023  CO2 24 03/24/2023  ALBUMIN 4.1 03/24/2023  PROT 6.2 03/24/2023  BILITOT 0.5 03/24/2023  ALKPHOS 70 03/24/2023  ALT 33 03/24/2023  GLOB 2.1 03/24/2023  CALCIUM 8.7 (L) 03/24/2023 No results found for requested labs within last 7 days. 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  His disease is characterized by minor cytopenias over a long period of observation with recently worsened anemia, thrombocytopenia, and a marrow with increased blasts consistent with disease progression. 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 Therapy was   started with decitabine and venetoclax, and transplantation  recommended given the now increased risk of shorten survival. The patient has had a formal  search of the registry and a 10/10 match was identified.A/P: Day 155 no evidence of gvhd. Drop in counts likely 2/2 flu. Start treatment. Repeat labs next week. Connect with local MDAssessment & PlanUpper Respiratory Infection  Cold symptoms have recently developed, including cough, congestion, and runny nose, without fever or shortness of breath. The cough is productive with clear sputum. Perform a nasal swab to identify potential pathogens. Consider antiviral or antibiotic treatment based on swab results.- flu start antiviral and start post viral prophylaxisPost-Transplant Management  Following a recent bone marrow transplant, there is 100% donor cell engraftment. Blood counts are stable with a slight decrease in ANC, likely due to the current infection. Continue monitoring blood counts and organ function. Plan for post-transplant vaccinations in August and repeat the bone marrow biopsy in August.Hypertension  Blood pressure readings are slightly elevated. Previously on Lisinopril and Amlodipine. Start Lisinopril at a lower dose in addition to Amlodipine. Check blood pressure at different times of the day and maintain a log. Reconnect with the cardiologist in [place] for further management.Hyperlipidemia  Previously on Atorvastatin. Add a cholesterol check to today's labs. Consider restarting Atorvastatin based on lab results after flu resolves. Skin Rash  A persistent spot on the chest is likely an acneiform rash or dry skin. Prescribe a stronger cream than cortisone to apply twice daily. Advise keeping the area cool and dry.General Health Maintenance  Continue folic acid supplementation. Plan for a colonoscopy. Consider resuming blood pressure and cholesterol medications. Check labs next week and schedule a follow-up appointment.1) Disease - Bone marrow biopsy September 10th, this was consistent with remission by morphology. Donor percentage was 100%, FISH and gene panel are normal.  Bone marrow biopsy on 01/05/23 was consistent with remission, no increase in blasts, and donor percentage is 100%, the fish was 100% donor and the gene panel was negative. 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 Goal is tac 5-10, check weekly ldh, haptoglobin, triglycerides. tapered tacrolimus to 4 mg in the morning and 4 mg in the evening. 3) Heme - Donor, Recipient both blood type A+ve. Blood counts looks good today. 4) ID - Prophylaxis with acyclovir, pentamidine, and Cresemba. He is not at risk for CMV. 5) Hypertension - He is on continued Norvasc. 6) Liver: Continue Ursodiol, monitor mild LFT changes. Normal today. 7) History of skin cancer: Needs to be addressed after 3 month time point. Planned Mohs procedure on December 18th. 8) Abnormal spine, lesion on MRI pre- transplant: Repeated MRI on November 14th. The abnormal 3 cm area in L2  was stable and there was some subtle enhancement of the cauda equina nerve roots is noted on the sagittal postcontrast, of unclear significance enhancement and this was referred to less conspicuous. 9) Floaters: The patient was evaluated by Dr Franklyn Lor, ophthalmologist. Plan to follow him in again in 6-8 weeks. 10) Paronychia in left big toe: Referred to podiatrist for further treatment. He completed Keflex 500 mg 4 times daily for 7 days with some improvement, follow up with Podiatry. Electronically Signed by Larwance Rote II, APRN, March 25, 2023

## 2023-03-28 ENCOUNTER — Encounter: Admit: 2023-03-28 | Payer: PRIVATE HEALTH INSURANCE

## 2023-03-31 ENCOUNTER — Ambulatory Visit: Admit: 2023-03-31 | Payer: MEDICARE | Attending: Adult Health

## 2023-03-31 ENCOUNTER — Inpatient Hospital Stay: Admit: 2023-03-31 | Discharge: 2023-03-31 | Payer: MEDICARE

## 2023-03-31 DIAGNOSIS — D509 Iron deficiency anemia, unspecified: Secondary | ICD-10-CM

## 2023-03-31 DIAGNOSIS — Z888 Allergy status to other drugs, medicaments and biological substances status: Secondary | ICD-10-CM

## 2023-03-31 DIAGNOSIS — Z79899 Other long term (current) drug therapy: Secondary | ICD-10-CM

## 2023-03-31 DIAGNOSIS — C931 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: Secondary | ICD-10-CM

## 2023-03-31 DIAGNOSIS — I10 Essential (primary) hypertension: Secondary | ICD-10-CM

## 2023-03-31 DIAGNOSIS — Z9481 Bone marrow transplant status: Secondary | ICD-10-CM

## 2023-03-31 DIAGNOSIS — Z9484 Stem cells transplant status: Secondary | ICD-10-CM

## 2023-03-31 LAB — CBC WITH AUTO DIFFERENTIAL
BKR WAM ABSOLUTE IMMATURE GRANULOCYTES.: 0.02 x 1000/ÂµL (ref 0.00–0.30)
BKR WAM ABSOLUTE LYMPHOCYTE COUNT.: 0.77 x 1000/ÂµL (ref 0.60–3.70)
BKR WAM ABSOLUTE NRBC (2 DEC): 0 x 1000/ÂµL (ref 0.00–1.00)
BKR WAM ANC (ABSOLUTE NEUTROPHIL COUNT): 1.92 x 1000/ÂµL — ABNORMAL LOW (ref 2.00–7.60)
BKR WAM BASOPHIL ABSOLUTE COUNT.: 0.04 x 1000/ÂµL (ref 0.00–1.00)
BKR WAM BASOPHILS: 1.2 % (ref 0.0–1.4)
BKR WAM EOSINOPHIL ABSOLUTE COUNT.: 0.1 x 1000/ÂµL (ref 0.00–1.00)
BKR WAM EOSINOPHILS: 3 % (ref 0.0–5.0)
BKR WAM HEMATOCRIT (2 DEC): 37.6 % — ABNORMAL LOW (ref 38.50–50.00)
BKR WAM HEMOGLOBIN: 12.2 g/dL — ABNORMAL LOW (ref 13.2–17.1)
BKR WAM IMMATURE GRANULOCYTES: 0.6 % (ref 0.0–1.0)
BKR WAM LYMPHOCYTES: 22.8 % (ref 17.0–50.0)
BKR WAM MCH (PG): 32.5 pg (ref 27.0–33.0)
BKR WAM MCHC: 32.4 g/dL (ref 31.0–36.0)
BKR WAM MCV: 100.3 fL — ABNORMAL HIGH (ref 80.0–100.0)
BKR WAM MONOCYTE ABSOLUTE COUNT.: 0.52 x 1000/ÂµL (ref 0.00–1.00)
BKR WAM MONOCYTES: 15.4 % — ABNORMAL HIGH (ref 4.0–12.0)
BKR WAM MPV: 10 fL (ref 8.0–12.0)
BKR WAM NEUTROPHILS: 57 % (ref 39.0–72.0)
BKR WAM NUCLEATED RED BLOOD CELLS: 0 % (ref 0.0–1.0)
BKR WAM PLATELETS: 202 x1000/ÂµL (ref 150–420)
BKR WAM RDW-CV: 13.9 % (ref 11.0–15.0)
BKR WAM RED BLOOD CELL COUNT.: 3.75 M/ÂµL — ABNORMAL LOW (ref 4.00–6.00)
BKR WAM WHITE BLOOD CELL COUNT: 3.4 x1000/ÂµL — ABNORMAL LOW (ref 4.0–11.0)

## 2023-03-31 LAB — IMMUNOGLOBULINS IGG, IGA, IGM
BKR IGA: 104 mg/dL (ref 70–470)
BKR IGG: 802 mg/dL (ref 700–1600)
BKR IGM: 56 mg/dL (ref 40–230)

## 2023-03-31 LAB — COMPREHENSIVE METABOLIC PANEL
BKR A/G RATIO: 1.8 (ref 1.0–2.2)
BKR ALANINE AMINOTRANSFERASE (ALT): 25 U/L (ref 9–59)
BKR ALBUMIN: 4 g/dL (ref 3.6–5.1)
BKR ALKALINE PHOSPHATASE: 75 U/L (ref 9–122)
BKR ANION GAP: 12 (ref 7–17)
BKR ASPARTATE AMINOTRANSFERASE (AST): 19 U/L (ref 10–35)
BKR AST/ALT RATIO: 0.8
BKR BILIRUBIN TOTAL: 0.6 mg/dL (ref <=1.2)
BKR BLOOD UREA NITROGEN: 18 mg/dL (ref 8–23)
BKR BUN / CREAT RATIO: 16.7 (ref 8.0–23.0)
BKR CALCIUM: 8.8 mg/dL (ref 8.8–10.2)
BKR CHLORIDE: 107 mmol/L (ref 98–107)
BKR CO2: 22 mmol/L (ref 20–30)
BKR CREATININE DELTA: 0.07
BKR CREATININE: 1.08 mg/dL (ref 0.40–1.30)
BKR EGFR, CREATININE (CKD-EPI 2021): >60 mL/min/{1.73_m2} (ref >=60)
BKR GLOBULIN: 2.2 g/dL (ref 2.0–3.9)
BKR GLUCOSE: 92 mg/dL (ref 70–100)
BKR POTASSIUM: 3.8 mmol/L (ref 3.3–5.3)
BKR PROTEIN TOTAL: 6.2 g/dL (ref 5.9–8.3)
BKR SODIUM: 141 mmol/L (ref 136–144)

## 2023-03-31 LAB — CHROMOSOME ANALYSIS (CYTOGENETICS) (YMG): BKR BANDING RESOLUTION: 400

## 2023-03-31 MED ORDER — ALBUTEROL SULFATE HFA 90 MCG/ACTUATION AEROSOL INHALER
90 | Freq: Once | RESPIRATORY_TRACT | Status: CP
Start: 2023-03-31 — End: ?
  Administered 2023-03-31: 10:00:00 90 mcg/actuation via RESPIRATORY_TRACT

## 2023-03-31 MED ORDER — PENTAMIDINE FOR INHALATION SOLUTION
Freq: Once | RESPIRATORY_TRACT | Status: CP
Start: 2023-03-31 — End: ?
  Administered 2023-03-31: 10:00:00 6.000 mL via RESPIRATORY_TRACT

## 2023-03-31 MED FILL — MULTIVITAMIN TABLET: ORAL | 90 days supply | Qty: 90 | Fill #2 | Status: CP

## 2023-03-31 NOTE — Progress Notes
 Cleto arrives ambulatory for labs and pentam accompanied by his wife.Pt stated feeling overall okay. Endorses having the flu last week but stated feeling better. Denies fever, chills, nausea, pain. Afebrile, VSS. Met with Margurite Auerbach APRN. Labs drawn on the 4th floor. Hgb 12.2 ; Plts 202 ; ANC 1.92No transfusions indicated. Pentamidine given by respiratory therapist.  Pt discharged in stable condition. Pt to follow up at home in [facility], per [place].

## 2023-04-06 DIAGNOSIS — R0600 Dyspnea, unspecified: Secondary | ICD-10-CM | POA: Diagnosis not present

## 2023-04-15 ENCOUNTER — Encounter: Admit: 2023-04-15 | Payer: PRIVATE HEALTH INSURANCE | Attending: Adult Health

## 2023-04-16 NOTE — Progress Notes
 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 Sherrell Puller INTERIM HISTORY: Douglas [name] is days 189  post transplant. Recently recovered from the flu, treated with tamiflu and post viral pna abx. Tolerated we.. congestion and symptoms nearly resolved, feeling well. Eager to move back [employer]. In good spirits. HPI:   Douglas [name] has a hx of Hypertension, hyperlipidemia, basal cell cancer of the skin, iron deficiency anemia.  The patient was evaluated for cytopenias in 2021 in [place].  Initial evaluation from [facility], source records are not available today.  Per the patient's subsequent records, he initially presented with leukopenia, thrombocytopenia, and monocytosis.  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  Initial bone marrow was performed on August 24, 2019, was reviewed at [facility] with findings of multilineage dysplasia.  The marrow was 40% cellular.  Megakaryocytes showed frequent dysplasia with  hyperchromatic forms and clustering.  Blasts were not increased.  A FISH panel for MDS  loci was negative.  Karyotype was normal. 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  The patient was observed and was without any treatment or transfusions.  In 2023, he had an acute drop in hemoglobin, was found to be iron deficient.  Workup for this did not show a clear focus of blood loss or bleeding.  The patient has continued evaluation and established care with Dr. Sherrell Puller in June 2023 as he spends some summers in [place].  Of note, there is a label of CLL in the patient's chart, which I suspect was a typo, and find no evidence that support that diagnosis.  Dr. Dema Severin assessment in June 2023 per the CPSS molecular score being considered intermediate one with a low risk of development of leukemia and median survival over 5 years.  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  The patient had a marrow performed at [facility] on 29th with CD34 stain showing 10% to 15% blasts focally more than 20%.  Marrow was hypercellular at 70%.  Dysplastic megakaryocytes again appreciated.  Molecular studies are pending.  Given the findings, treatment with the decitabine and venetoclax was recommended.  A referral has been placed for urgent EGD colonoscopy given the low iron concern for occult source of bleeding.He started treatment last week which he is tolerated well. He received 5-6 red blood cell transfusion in the past several month. He was suffering weakness and dyspnea which is now improved after transfusion. He does not have a significant infectious history, he did have COVID-19 in the past from which he recovered. He has hypertension and hyperlipidemia managed by cardiologist in [place]. He reports a negative stress test in the past. He has had number of skin cancers and has had several MOHS procedures. He is followed with a urologist for some BPH and abnormal PSA. He has had 2 prostate biopsies which was negative. He carries a diagnosis of iron deficiency and require transfusion in [place] after a significant and acute drop in hemoglobin. There was no clinical evidence of bleeding. Endoscopy and colonoscopy are planned to exclude a source of bleeding. Douglas [name] is a 73 yo gentlemen that lives with is wife in [place].  He is currently spending the summer in [place] visiting family.  He has 2 daughters and 5 grandchildren.  Retired, having worked for [employer].  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  He is a social drinker of 1-2 beers per week, denies illicit drug use and is a non smoker.  Covid-19 vaccine series completed. 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)   CLL (chronic lymphocytic leukemia) (HC Code)   Depression   DOE (dyspnea on exertion)   Enlarged prostate   Essential hypertension   Family history of ischemic heart disease   Glaucoma   Hypertensive heart disease without heart failure   Mixed hyperlipidemia  Surgical HistoryPast Surgical History: Procedure Laterality Date  ADENOIDECTOMY    BONE MARROW BIOPSY    PROSTATE BIOPSY    TONSILLECTOMY    VASCULAR SURGERY   AllergiesAllergies Allergen Reactions  Voriconazole Other (See Comments)   Gingival edema/redness making it difficult to bite down  Current Outpatient Medications:   acyclovir (ZOVIRAX) 400 mg tablet, Take 1 tablet (400 mg total) by mouth every 12 (twelve) hours., Disp: 180 tablet, Rfl: 3  amLODIPine (NORVASC) 10 mg tablet, Take 1 tablet (10 mg total) by mouth daily., Disp: , Rfl:   finasteride (PROSCAR) 5 mg tablet, TAKE 1 TABLET BY MOUTH DAILY FOR ENLARGED PROSTATE WITH URINATION PROBLEM TAKE WITH OR WITHOUT MEALS, Disp: , Rfl:   folic acid (FOLVITE) 1 mg tablet, Take 1 tablet (1 mg total) by mouth daily., Disp: 30 tablet, Rfl: 11  latanoprost (XALATAN) 0.005 % ophthalmic solution, Place 1 drop into both eyes nightly., Disp: , Rfl:   levoFLOXacin (LEVAQUIN) 750 mg tablet, Take 1 tablet (750 mg total) by mouth daily. POST VIRAL PNA PROPHYLAXIS, Disp: 10 tablet, Rfl: 0  lisinopriL (PRINIVIL,ZESTRIL) 10 mg tablet, Take 1 tablet (10 mg total) by mouth daily., Disp: 30 tablet, Rfl: 0  Miscellaneous Medical Supply, Please provide rolling walker, Use as directed., Disp: 1 each, Rfl: 0  multivitamin tablet, Take 1 tablet by mouth daily., Disp: 30 tablet, Rfl: 11  ondansetron (ZOFRAN) 8 mg tablet, Take 1 tablet (8 mg total) by mouth every 8 (eight) hours as needed for nausea or vomiting. (Patient not taking: Reported on 03/31/2023), Disp: 30 tablet, Rfl: 2  pentamidine (PENTAM) 300 mg inhalation solution, Inhale 6 mLs (300 mg total) into the lungs every 28 days. 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 (HC Code)   CLL (chronic lymphocytic leukemia) (HC Code)   Depression   DOE (dyspnea on exertion)   Enlarged prostate   Essential hypertension   Family history of ischemic heart disease   Glaucoma   Hypertensive heart disease without heart failure   Mixed hyperlipidemia  Family History Problem Relation Age of Onset  Breast cancer Mother   Liver cancer Mother   Glaucoma  Father   Heart disease Father   Prostate cancer Father   Breast cancer Sister   Thyroid disease Sister   Hearing loss Sister   Diabetes Daughter   No Known Problems Daughter   No Known Problems Grandchild  Social History Socioeconomic History  Marital status: Married   Spouse name: Not on file  Number of children: Not on file  Years of education: Not on file  Highest education level: Not on file Occupational History  Not on file Tobacco Use  Smoking status: Never  Smokeless tobacco: Never Vaping Use  Vaping status: Never Used Substance and Sexual Activity  Alcohol use: Yes   Alcohol/week: 2.0 standard drinks of alcohol   Types: 2 Cans of beer per week   Comment: social drinker once a week  Drug use: Never  Sexual activity: Not on file Other Topics Concern  Not on file Social History Narrative  Not on file Social Drivers of Health Financial Resource Strain: Low Risk  (08/05/2021)  Overall Financial Resource Strain (CARDIA)   Difficulty of Paying Living Expenses: Not very hard Food Insecurity: No Food Insecurity (09/18/2022)  Hunger Vital Sign   Worried About Running Out of Food in the Last Year: Never true   Ran Out of Food in the Last Year: Never true Transportation Needs: No Transportation Needs (09/18/2022)  PRAPARE - Designer, jewellery (Medical): No   Lack of Transportation (Non-Medical): No Physical Activity: Not on file Stress: Not on file Social Connections: Not on file Intimate Partner Violence: Not on file Housing Stability: Low Risk  (09/18/2022)  Housing Stability   Housing Stability: I have a steady place to live   Housing Stability: Not on file EXAM: General Appearance:He is heavy set, appears wellHEENT:  Mucous membranes moist, No oral lesions, no erythema, teeth are adequate repair Lungs: ClearCor: RRR, no m/r/gAbd: soft nontender, no hepatosplenomegaly, Extr: trace edema.  Had a paronychia about the lateral aspect of left big toe with an ingrown toe nail. See picture below for more details. Looks less swollen today. Skin:no rash  Lymph Nodes - no palpable adenopathy. Neurologic: mentation appears normal, normal speech, strength symmetric. LABSLab Results Component Value Date  WBC 3.4 (L) 03/31/2023  HGB 12.2 (L) 03/31/2023  HCT 37.60 (L) 03/31/2023  MCV 100.3 (H) 03/31/2023  PLT 202 03/31/2023 Comprehensive Metabolic Panel:Lab Results Component Value Date  GLU 92 03/31/2023  BUN 18 03/31/2023  CREATININE 1.08 03/31/2023  NA 141 03/31/2023  K 3.8 03/31/2023  CL 107 03/31/2023  CO2 22 03/31/2023  ALBUMIN 4.0 03/31/2023  PROT 6.2 03/31/2023  BILITOT 0.6 03/31/2023  ALKPHOS 75 03/31/2023  ALT 25 03/31/2023  GLOB 2.2 03/31/2023  CALCIUM 8.8 03/31/2023 No results found for requested labs within last 7 days. 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  His disease is characterized by minor cytopenias over a long period of observation with recently worsened anemia, thrombocytopenia, and a marrow with increased blasts consistent with disease progression. 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 Therapy was   started with decitabine and venetoclax, and transplantation  recommended given the now increased risk of shorten survival. The patient has had a formal  search of the registry and a 10/10 match was identified.A/P: Day 182 no evidence of gvhd. Slight drop seen with last labs, likely 2/2 influenza, since recovered. He is feeling better. No new concerns. Eager to move to [facility]. Will pass on contact information and fax labs / notes. - stopped tacrolimus 12/25, no  evidence of GVHD. Assessment & PlanUpper Respiratory Infection  Recent Influenza, treated with tamiflu and post viral pna ppx abx. Resolved. Post-Transplant Management  Day 180 bone marrow, 100% donor cell engraftment. Blood counts are stable ID- continue acyclovir 400mg  BID- pentam today, CD4+ 198, better but borderline. May need ongoing pjp ppx until further improved. Hypertension  Blood pressure readings are slightly elevated. Previously on Lisinopril and Amlodipine. Start Lisinopril at a lower dose in addition to Amlodipine. Check blood pressure at different times of the day and maintain a log. Reconnect with the cardiologist in [place] for further management.Hyperlipidemia  Previously on Atorvastatin. Add a cholesterol check to today's labs. - restart statinSkin Rash  A persistent spot on the chest is likely an acneiform rash or dry skin. Prescribe a stronger cream than cortisone to apply twice daily. Advise keeping the area cool and dry.General Health Maintenance  Continue folic acid supplementation. Plan for a maintenance colonoscopy.  1) Disease - Bone marrow biopsy September 10th, this was consistent with remission by morphology. Donor percentage was 100%, FISH and gene panel are normal.  Bone marrow biopsy on 01/05/23 was consistent with remission, no increase in blasts, and donor percentage is 100%, the fish was 100% donor and the gene panel was negative. 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 Goal is tac 5-10, check weekly ldh, haptoglobin, triglycerides. Tacrolimus stopped 12/243) Heme - Donor, Recipient both blood type A+ve. Blood counts looks good today. 4) ID - Prophylaxis with acyclovir, pentamidine, and Cresemba. He is not at risk for CMV. 5) Hypertension - He is on continued Norvasc. 6) Liver: Continue Ursodiol, monitor mild LFT changes. Normal today. 7) History of skin cancer: Needs to be addressed after 3 month time point. Planned Mohs procedure on December 18th. 8) Abnormal spine, lesion on MRI pre- transplant: Repeated MRI on November 14th. The abnormal 3 cm area in L2 was stable and there was some subtle enhancement of the cauda equina nerve roots is noted on the sagittal postcontrast, of unclear significance enhancement and this was referred to less conspicuous. 9) Floaters: The patient was evaluated by Dr Franklyn Lor, ophthalmologist. Plan to follow him in again in 6-8 weeks. 10) Paronychia in left big toe: Referred to podiatrist for further treatment. He completed Keflex 500 mg 4 times daily for 7 days with some improvement, follow up with Podiatry. Electronically Signed by Larwance Rote II, APRN, April 09, 2023

## 2023-04-17 ENCOUNTER — Encounter: Admit: 2023-04-17 | Payer: PRIVATE HEALTH INSURANCE | Attending: Adult Health

## 2023-04-19 MED ORDER — LISINOPRIL 10 MG TABLET
10 | ORAL_TABLET | Freq: Every day | ORAL | 1 refills | Status: AC
Start: 2023-04-19 — End: ?

## 2023-04-30 ENCOUNTER — Encounter: Admit: 2023-04-30 | Payer: PRIVATE HEALTH INSURANCE | Attending: Vascular and Interventional Radiology

## 2023-05-03 ENCOUNTER — Ambulatory Visit: Payer: PPO | Admitting: Dermatology

## 2023-05-03 NOTE — Patient Instructions (Addendum)

## 2023-05-03 NOTE — Progress Notes (Deleted)
   Follow-Up Visit   Subjective  Frank Williamson is a 73 y.o. male who presents for the following: Skin Cancer Screening and Upper Body Skin Exam. Patient with history of dysplastic nevus and BCC. Pt does have place at L cheek, R cheek.   The patient presents for Upper Body Skin Exam (UBSE) for skin cancer screening and mole check. The patient has spots, moles and lesions to be evaluated, some may be new or changing and the patient may have concern these could be cancer.   The following portions of the chart were reviewed this encounter and updated as appropriate: medications, allergies, medical history  Review of Systems:  No other skin or systemic complaints except as noted in HPI or Assessment and Plan.  Objective  Well appearing patient in no apparent distress; mood and affect are within normal limits.  All skin waist up examined. Relevant physical exam findings are noted in the Assessment and Plan.    Assessment & Plan    Skin cancer screening performed today.  Actinic Damage - Chronic condition, secondary to cumulative UV/sun exposure - diffuse scaly erythematous macules with underlying dyspigmentation - Recommend daily broad spectrum sunscreen SPF 30+ to sun-exposed areas, reapply every 2 hours as needed.  - Staying in the shade or wearing long sleeves, sun glasses (UVA+UVB protection) and wide brim hats (4-inch brim around the entire circumference of the hat) are also recommended for sun protection.  - Call for new or changing lesions.  Lentigines, Seborrheic Keratoses, Hemangiomas - Benign normal skin lesions - Benign-appearing - Call for any changes  Melanocytic Nevi - Tan-brown and/or pink-flesh-colored symmetric macules and papules - Benign appearing on exam today - Observation - Call clinic for new or changing moles - Recommend daily use of broad spectrum spf 30+ sunscreen to sun-exposed areas.   -Nevus 1.0 cm med dark brown macule with light tan rim at L  abdomen 4.0 mm speckled brown macule at spinal upper back   History of Basal Cell Carcinoma of the Skin Spinal upper back, [facility] 04/28/2021 - No evidence of recurrence today  - Recommend regular full body skin exams - Recommend daily broad spectrum sunscreen SPF 30+ to sun-exposed areas, reapply every 2 hours as needed.  - Call if any new or changing lesions are noted between office visits   History of Dysplastic Nevi Multiple see history - No evidence of recurrence today  - Recommend regular full body skin exams - Recommend daily broad spectrum sunscreen SPF 30+ to sun-exposed areas, reapply every 2 hours as needed.  - Call if any new or changing lesions are noted between office visits     No follow-ups on file.  I, Soundra Pilon, CMA, am acting as scribe for Willeen Niece, MD .   Documentation: I have reviewed the above documentation for accuracy and completeness, and I agree with the above.  Willeen Niece, MD

## 2023-05-06 NOTE — Progress Notes (Signed)
Pt canceled appointment  This encounter was created in error - please disregard.

## 2023-05-12 ENCOUNTER — Encounter: Admit: 2023-05-12 | Payer: PRIVATE HEALTH INSURANCE | Attending: Adult Health

## 2023-05-17 ENCOUNTER — Telehealth: Admit: 2023-05-17 | Payer: PRIVATE HEALTH INSURANCE

## 2023-05-17 MED ORDER — ATORVASTATIN 10 MG TABLET
10 | ORAL_TABLET | Freq: Every day | ORAL | 2 refills | Status: AC
Start: 2023-05-17 — End: ?

## 2023-05-17 NOTE — Telephone Encounter
 I spoke with Douglas [name] who recently relocated to [facility] he requested refill for Lipitor as his new patient appointment with Cardiology  is not until May. He also asked if possible could he switch his care to hematologist closer to his home (Dr Risk )see note in epic from Dr Izola Price (new patient visit from 3/14)Hatim is also questioning post transplant vaccine administration . He states it is very difficult to obtain them where he is located. I informed him he can discuss this when he comes to [facility] in August for his 1 year BM Bx. He states he is willing to stay with his daughter if necessary for a few months so he can receive his 66 and 14 mos vaccines . I told him I would discuss with Dr Lenard Forth and get back to him

## 2023-05-19 MED ORDER — LISINOPRIL 10 MG TABLET
10 | ORAL_TABLET | ORAL | 1 refills | Status: AC
Start: 2023-05-19 — End: ?

## 2023-06-03 ENCOUNTER — Ambulatory Visit: Admitting: Dermatology

## 2023-06-03 ENCOUNTER — Encounter: Payer: Self-pay | Admitting: Dermatology

## 2023-06-03 DIAGNOSIS — W908XXA Exposure to other nonionizing radiation, initial encounter: Secondary | ICD-10-CM | POA: Diagnosis not present

## 2023-06-03 DIAGNOSIS — L814 Other melanin hyperpigmentation: Secondary | ICD-10-CM | POA: Diagnosis not present

## 2023-06-03 DIAGNOSIS — L570 Actinic keratosis: Secondary | ICD-10-CM

## 2023-06-03 DIAGNOSIS — L820 Inflamed seborrheic keratosis: Secondary | ICD-10-CM | POA: Diagnosis not present

## 2023-06-03 DIAGNOSIS — D492 Neoplasm of unspecified behavior of bone, soft tissue, and skin: Secondary | ICD-10-CM | POA: Diagnosis not present

## 2023-06-03 DIAGNOSIS — Z1283 Encounter for screening for malignant neoplasm of skin: Secondary | ICD-10-CM

## 2023-06-03 DIAGNOSIS — Z85828 Personal history of other malignant neoplasm of skin: Secondary | ICD-10-CM | POA: Diagnosis not present

## 2023-06-03 DIAGNOSIS — Z86018 Personal history of other benign neoplasm: Secondary | ICD-10-CM

## 2023-06-03 DIAGNOSIS — S0000XA Unspecified superficial injury of scalp, initial encounter: Secondary | ICD-10-CM

## 2023-06-03 DIAGNOSIS — L821 Other seborrheic keratosis: Secondary | ICD-10-CM

## 2023-06-03 DIAGNOSIS — D239 Other benign neoplasm of skin, unspecified: Secondary | ICD-10-CM

## 2023-06-03 DIAGNOSIS — D1801 Hemangioma of skin and subcutaneous tissue: Secondary | ICD-10-CM

## 2023-06-03 DIAGNOSIS — D225 Melanocytic nevi of trunk: Secondary | ICD-10-CM | POA: Diagnosis not present

## 2023-06-03 DIAGNOSIS — L578 Other skin changes due to chronic exposure to nonionizing radiation: Secondary | ICD-10-CM | POA: Diagnosis not present

## 2023-06-03 DIAGNOSIS — W2209XA Striking against other stationary object, initial encounter: Secondary | ICD-10-CM | POA: Diagnosis not present

## 2023-06-03 DIAGNOSIS — D229 Melanocytic nevi, unspecified: Secondary | ICD-10-CM

## 2023-06-03 DIAGNOSIS — T1490XA Injury, unspecified, initial encounter: Secondary | ICD-10-CM

## 2023-06-03 HISTORY — DX: Other benign neoplasm of skin, unspecified: D23.9

## 2023-06-03 NOTE — Patient Instructions (Addendum)

## 2023-06-03 NOTE — Progress Notes (Signed)
 Follow-Up Visit   Subjective  Frank Williamson is a 73 y.o. male who presents for the following: Skin Cancer Screening and Full Body Skin Exam, hx BCC, hx of Dysplastic nevus  Check a spot on his scalp he hit his scalp a few days ago on a cabinet.   The patient presents for Total-Body Skin Exam (TBSE) for skin cancer screening and mole check. The patient has spots, moles and lesions to be evaluated, some may be new or changing and the patient may have concern these could be cancer.  The following portions of the chart were reviewed this encounter and updated as appropriate: medications, allergies, medical history  Review of Systems:  No other skin or systemic complaints except as noted in HPI or Assessment and Plan.  Objective  Well appearing patient in no apparent distress; mood and affect are within normal limits.  A full examination was performed including scalp, head, eyes, ears, nose, lips, neck, chest, axillae, abdomen, back, buttocks, bilateral upper extremities, bilateral lower extremities, hands, feet, fingers, toes, fingernails, and toenails. All findings within normal limits unless otherwise noted below.   Relevant physical exam findings are noted in the Assessment and Plan.  left cheek 0.6 cm irregular brown macule  LUQA 1.1 cm irregular brown macule  scalp x 3 (3) Erythematous thin papules/macules with gritty scale.          Assessment & Plan   SKIN CANCER SCREENING PERFORMED TODAY.  LENTIGINES, SEBORRHEIC KERATOSES, HEMANGIOMAS - Benign normal skin lesions - Benign-appearing - Call for any changes  MELANOCYTIC NEVI - Tan-brown and/or pink-flesh-colored symmetric macules and papules - Benign appearing on exam today - Observation - Call clinic for new or changing moles - Recommend daily use of broad spectrum spf 30+ sunscreen to sun-exposed areas.   TRAUMA  Right Scalp  Pink traumatized appearing area  If not healed in 6 weeks return to the office  for a biopsy See photo   HISTORY OF DYSPLASTIC NEVUS No evidence of recurrence today Recommend regular full body skin exams Recommend daily broad spectrum sunscreen SPF 30+ to sun-exposed areas, reapply every 2 hours as needed.  Call if any new or changing lesions are noted between office visits    HISTORY OF BASAL CELL CARCINOMA OF THE SKIN Spinal upper back - No evidence of recurrence today - Recommend regular full body skin exams - Recommend daily broad spectrum sunscreen SPF 30+ to sun-exposed areas, reapply every 2 hours as needed.  - Call if any new or changing lesions are noted between office visits   NEOPLASM OF SKIN (2) left cheek Epidermal / dermal shaving  Lesion diameter (cm):  0.6 Informed consent: discussed and consent obtained   Timeout: patient name, date of birth, surgical site, and procedure verified   Procedure prep:  Patient was prepped and draped in usual sterile fashion Prep type:  Isopropyl alcohol Anesthesia: the lesion was anesthetized in a standard fashion   Anesthetic:  1% lidocaine w/ epinephrine 1-100,000 buffered w/ 8.4% NaHCO3 Hemostasis achieved with: pressure, aluminum chloride and electrodesiccation   Outcome: patient tolerated procedure well   Post-procedure details: sterile dressing applied and wound care instructions given   Dressing type: bandage and petrolatum   Specimen 1 - Surgical pathology Differential Diagnosis: R/O Dysplastic nevus   Check Margins: No LUQA Epidermal / dermal shaving  Lesion diameter (cm):  1.1 Informed consent: discussed and consent obtained   Patient was prepped and draped in usual sterile fashion: area prepped with alcohol.  Anesthesia: the lesion was anesthetized in a standard fashion   Anesthetic:  1% lidocaine w/ epinephrine 1-100,000 buffered w/ 8.4% NaHCO3 Instrument used: flexible razor blade   Hemostasis achieved with: pressure, aluminum chloride and electrodesiccation   Outcome: patient tolerated  procedure well   Post-procedure details: wound care instructions given   Post-procedure details comment:  Ointment and small bandage applied Specimen 2 - Surgical pathology Differential Diagnosis: R/O Dysplastic nevus   Check Margins: No AK (ACTINIC KERATOSIS) (3) scalp x 3 (3) ACTINIC DAMAGE - chronic, secondary to cumulative UV radiation exposure/sun exposure over time - diffuse scaly erythematous macules with underlying dyspigmentation - Recommend daily broad spectrum sunscreen SPF 30+ to sun-exposed areas, reapply every 2 hours as needed.  - Recommend staying in the shade or wearing long sleeves, sun glasses (UVA+UVB protection) and wide brim hats (4-inch brim around the entire circumference of the hat). - Call for new or changing lesions.  Destruction of lesion - scalp x 3 (3) Complexity: simple   Destruction method: cryotherapy   Informed consent: discussed and consent obtained   Timeout:  patient name, date of birth, surgical site, and procedure verified Lesion destroyed using liquid nitrogen: Yes   Region frozen until ice ball extended beyond lesion: Yes   Outcome: patient tolerated procedure well with no complications   Post-procedure details: wound care instructions given     Return in about 1 year (around 06/02/2024) for TBSE, hx of BCC, hx of Dysplastic nevus .  IClara Crisp, CMA, am acting as scribe for Celine Collard, MD .   Documentation: I have reviewed the above documentation for accuracy and completeness, and I agree with the above.  Celine Collard, MD

## 2023-06-10 LAB — SURGICAL PATHOLOGY

## 2023-06-11 ENCOUNTER — Encounter: Admit: 2023-06-11 | Payer: PRIVATE HEALTH INSURANCE | Attending: Adult Health

## 2023-06-11 ENCOUNTER — Encounter: Payer: Self-pay | Admitting: Dermatology

## 2023-06-14 ENCOUNTER — Encounter: Admit: 2023-06-14 | Payer: PRIVATE HEALTH INSURANCE | Attending: Adult Health

## 2023-06-14 ENCOUNTER — Telehealth: Payer: Self-pay

## 2023-06-14 NOTE — Telephone Encounter (Addendum)
 Tried calling patient regarding results. No answer. LM for patient to return call.   ----- Message from Celine Collard sent at 06/11/2023  9:10 AM EDT ----- FINAL DIAGNOSIS        1. Skin, left cheek :       SEBORRHEIC KERATOSIS, IRRITATED        2. Skin, LUQA :       DYSPLASTIC COMPOUND NEVUS WITH MILD ATYPIA, PERIPHERAL AND DEEP MARGINS INVOLVED   1- benign irritated keratosis No further treatment needed 2- Mild dysplastic Recheck next visit

## 2023-06-15 ENCOUNTER — Telehealth: Payer: Self-pay

## 2023-06-15 NOTE — Telephone Encounter (Addendum)
 Patient notified of results. No further questions will recheck at next follow up. ----- Message from Celine Collard sent at 06/11/2023  9:10 AM EDT ----- FINAL DIAGNOSIS        1. Skin, left cheek :       SEBORRHEIC KERATOSIS, IRRITATED        2. Skin, LUQA :       DYSPLASTIC COMPOUND NEVUS WITH MILD ATYPIA, PERIPHERAL AND DEEP MARGINS INVOLVED   1- benign irritated keratosis No further treatment needed 2- Mild dysplastic Recheck next visit

## 2023-06-16 ENCOUNTER — Encounter: Admit: 2023-06-16 | Payer: PRIVATE HEALTH INSURANCE

## 2023-06-16 ENCOUNTER — Telehealth: Admit: 2023-06-16 | Payer: PRIVATE HEALTH INSURANCE | Attending: Medical Oncology

## 2023-06-16 MED ORDER — LISINOPRIL 10 MG TABLET
10 | Freq: Every day | ORAL | 5.00 refills | 30.00 days | Status: AC
Start: 2023-06-16 — End: ?

## 2023-06-16 NOTE — Telephone Encounter
 I spoke with Douglas [name] who decribes rough  sandpaper type texture inner buccal area . I asked if he had any sensitivity to spicy or  acidic foods he denied . He does endorse gum tenderness asked if he had ulcerations or white streaking or ulcerations, raised areas in mouth.. He confirmed bumps inner buccal area and white patches.I asked him to send photos which he did . I reviewed photos with Dr Margean Sheehan striated white tissue noted with surrounding erythema cw GVHD . Tele health visit scheduled with Karyl Paget for tomorrow . I confirmed appt with Douglas [name]

## 2023-06-16 NOTE — Telephone Encounter
 Patient calling-- states he states he is currently in [facility] and saw his Hematologist there, Dr. Santana Cue. He mentioned to Dr.Rizk that he feels like he has sandpaper in the inside of his cheeks. He states he was instructed to use a salt water  and baking soda rinse, which does not seem to be helping. Dr.Rizk wanted him to call our office to let Dr.Seropian know and see if he has any suggestions. Douglas [name]: [phone]

## 2023-06-17 ENCOUNTER — Ambulatory Visit: Admit: 2023-06-17 | Payer: MEDICARE | Attending: Adult Health

## 2023-06-17 ENCOUNTER — Encounter: Admit: 2023-06-17 | Payer: PRIVATE HEALTH INSURANCE

## 2023-06-17 DIAGNOSIS — D89813 Graft-versus-host disease, unspecified: Secondary | ICD-10-CM

## 2023-06-17 MED ORDER — DEXAMETHASONE 0.5 MG/5 ML ORAL SOLUTION
0.5 | Freq: Three times a day (TID) | ORAL | 3 refills | 9.50 days | Status: AC
Start: 2023-06-17 — End: ?

## 2023-06-23 MED ORDER — ATORVASTATIN 10 MG TABLET
10 | ORAL_TABLET | Freq: Every day | ORAL | 2 refills | 30.00000 days | Status: AC
Start: 2023-06-23 — End: ?

## 2023-06-29 ENCOUNTER — Encounter: Admit: 2023-06-29 | Payer: PRIVATE HEALTH INSURANCE

## 2023-06-29 ENCOUNTER — Encounter: Admit: 2023-06-29 | Payer: PRIVATE HEALTH INSURANCE | Attending: Adult Health

## 2023-07-13 ENCOUNTER — Encounter: Admit: 2023-07-13 | Payer: PRIVATE HEALTH INSURANCE

## 2023-07-15 ENCOUNTER — Encounter: Admit: 2023-07-15 | Payer: PRIVATE HEALTH INSURANCE

## 2023-07-15 MED ORDER — FINASTERIDE 5 MG TABLET
5 | ORAL_TABLET | Freq: Every day | ORAL | 3 refills | 60.00000 days | Status: AC
Start: 2023-07-15 — End: ?

## 2023-07-23 NOTE — Progress Notes
 VIDEO TELEHEALTH VISIT: This clinician is part of the telehealth program and is conducting this visit in a currently approved location. For this visit the clinician and patient were present via interactive audio & video telecommunications system that permits real-time communications, via the [employer].Patient's use of the telehealth platform followed consent and acknowledges agreement to permit telehealth for this visit. State patient is located in: [place] clinician is appropriately licensed in the above state to provide care for this visit. Other individuals present during the telehealth encounter and their role/relation: noneBecause this visit was completed over video, a hands-on physical exam was not performed. 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 Anette Barb INTERIM HISTORY: ALYJAH LOVINGOOD is days 250  post transplant. Some oral changes. Video visit to assess. No interim infections.HPI:   Mr Tilda Fogo has a hx of Hypertension, hyperlipidemia, basal cell cancer of the skin, iron deficiency anemia.  The patient was evaluated for cytopenias in 2021 in [place] .  Initial evaluation from [facility], source records are not available today.  Per the patient's subsequent records, he initially presented with leukopenia, thrombocytopenia, and monocytosis.  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  Initial bone marrow was performed on August 24, 2019, was reviewed at [facility] with findings of multilineage dysplasia.  The marrow was 40% cellular.  Megakaryocytes showed frequent dysplasia with  hyperchromatic forms and clustering.  Blasts were not increased.  A FISH panel for MDS  loci was negative.  Karyotype was normal. 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  The patient was observed and was without any treatment or transfusions.  In 2023, he had an acute drop in hemoglobin, was found to be iron deficient.  Workup for this did not show a clear focus of blood loss or bleeding.  The patient has continued evaluation and established care with Dr. Anette Barb in June 2023 as he spends some summers in [place] .  Of note, there is a label of CLL in the patient's chart, which I suspect was a typo, and find no evidence that support that diagnosis.  Dr. Linnie Riches assessment in June 2023 per the CPSS molecular score being considered intermediate one with a low risk of development of leukemia and median survival over 5 years.  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  The patient had a marrow performed at [facility] on 29th with CD34 stain showing 10% to 15% blasts focally more than 20%.  Marrow was hypercellular at 70%.  Dysplastic megakaryocytes again appreciated.  Molecular studies are pending.  Given the findings, treatment with the decitabine  and venetoclax  was recommended.  A referral has been placed for urgent EGD colonoscopy given the low iron concern for occult source of bleeding.He started treatment last week which he is tolerated well. He received 5-6 red blood cell transfusion in the past several month. He was suffering weakness and dyspnea which is now improved after transfusion. He does not have a significant infectious history, he did have COVID-19 in the past from which he recovered. He has hypertension and hyperlipidemia managed by cardiologist in [place]. He reports a negative stress test in the past. He has had number of skin cancers and has had several MOHS procedures. He is followed with a urologist for some BPH and abnormal PSA. He has had 2 prostate biopsies which was negative. He carries a diagnosis of iron deficiency and require transfusion in [place]  after a significant and acute drop in hemoglobin. There was no clinical evidence of bleeding. Endoscopy and colonoscopy are planned to exclude a source of bleeding. Amire Leazer is a 73 yo gentlemen that lives with is wife in [place].  He is currently spending the summer in  visiting family.  He has 2 daughters and 5 grandchildren.  Retired, having worked for [employer].  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  He is a social drinker of 1-2 beers per week, denies illicit drug use and is a non smoker.  Covid-19 vaccine series completed. 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)   CLL (chronic lymphocytic leukemia) (HC Code)   Depression   DOE (dyspnea on exertion)   Enlarged prostate   Essential hypertension   Family history of ischemic heart disease   Glaucoma   Hypertensive heart disease without heart failure   Mixed hyperlipidemia  Surgical HistoryPast Surgical History: Procedure Laterality Date  ADENOIDECTOMY    BONE MARROW BIOPSY    PROSTATE BIOPSY    TONSILLECTOMY    VASCULAR SURGERY   AllergiesAllergies Allergen Reactions  Voriconazole  Other (See Comments)   Gingival edema/redness making it difficult to bite down  Current Outpatient Medications:   acyclovir  (ZOVIRAX ) 400 mg tablet, Take 1 tablet (400 mg total) by mouth every 12 (twelve) hours., Disp: 180 tablet, Rfl: 3  amLODIPine  (NORVASC ) 10 mg tablet, Take 1 tablet (10 mg total) by mouth daily., Disp: , Rfl:   atorvastatin  (LIPITOR) 10 mg tablet, TAKE 1 TABLET BY MOUTH EVERY DAY, Disp: 90 tablet, Rfl: 1  finasteride  (PROSCAR ) 5 mg tablet, Take 1 tablet (5 mg total) by mouth daily., Disp: 30 tablet, Rfl: 2  folic acid  (FOLVITE ) 1 mg tablet, Take 1 tablet (1 mg total) by mouth daily., Disp: 30 tablet, Rfl: 11  latanoprost  (XALATAN ) 0.005 % ophthalmic solution, Place 1 drop into both eyes nightly., Disp: , Rfl:   levoFLOXacin  (LEVAQUIN ) 750 mg tablet, Take 1 tablet (750 mg total) by mouth daily. POST VIRAL PNA PROPHYLAXIS, Disp: 10 tablet, Rfl: 0  lisinopriL  (PRINIVIL ,ZESTRIL ) 10 mg tablet, Take 1 tablet (10 mg total) by mouth daily., Disp: , Rfl:   Miscellaneous Medical Supply, Please provide rolling walker, Use as directed., Disp: 1 each, Rfl: 0  multivitamin tablet, Take 1 tablet by mouth daily., Disp: 30 tablet, Rfl: 11  ondansetron  (ZOFRAN ) 8 mg tablet, Take 1 tablet (8 mg total) by mouth every 8 (eight) hours as needed for nausea or vomiting. (Patient not taking: Reported on 03/31/2023), Disp: 30 tablet, Rfl: 2  pentamidine  (PENTAM ) 300 mg inhalation solution, Inhale 6 mLs (300 mg total) into the lungs every 28 days. 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 (HC Code)   CLL (chronic lymphocytic leukemia) (HC Code)   Depression   DOE (dyspnea on exertion)   Enlarged prostate   Essential hypertension   Family history of ischemic heart disease Glaucoma   Hypertensive heart disease without heart failure   Mixed hyperlipidemia  Family History Problem Relation Age of Onset  Breast cancer Mother   Liver cancer Mother   Glaucoma Father   Heart disease Father   Prostate cancer Father   Breast cancer Sister   Thyroid disease Sister   Hearing loss Sister   Diabetes Daughter   No Known Problems Daughter   No Known Problems Grandchild  Social History Socioeconomic History  Marital status: Married   Spouse name: Not on file  Number of children: Not on file  Years of education: Not on file  Highest education level: Not on file Occupational History  Not on file Tobacco Use  Smoking status: Never  Smokeless tobacco: Never Vaping Use  Vaping status: Never Used Substance and Sexual Activity  Alcohol use: Yes   Alcohol/week: 2.0 standard drinks of alcohol   Types: 2 Cans of beer per week   Comment: social drinker once a week  Drug use: Never  Sexual activity: Not on file Other Topics Concern  Not on file Social History Narrative  Not on file Social Drivers of Health Financial Resource Strain: Low Risk  (08/05/2021)  Overall Financial Resource Strain (CARDIA)   Difficulty of Paying Living Expenses: Not very hard Food Insecurity: No Food Insecurity (09/18/2022)  Hunger Vital Sign   Worried About Running Out of Food in the Last Year: Never true   Ran Out of Food in the Last Year: Never true Transportation Needs: No Transportation Needs (09/18/2022)  PRAPARE - Designer, jewellery (Medical): No   Lack of Transportation (Non-Medical): No Physical Activity: Not on file Stress: Not on file Social Connections: Not on file Intimate Partner Violence: Not on file Housing Stability: Low Risk  (09/18/2022)  Housing Stability   Housing Stability: I have a steady place to live   Housing Stability: Not on file EXAM: General Appearance:He is heavy set, appears wellHEENT:  lacy appearance on uccal mucosa seen on video. Mucous membranes moist, No oral lesions, no erythema, teeth are adequate repair Lungs: ClearCor: RRR, no m/r/gAbd: soft nontender, no hepatosplenomegaly, Extr: trace edema.  Had a paronychia about the lateral aspect of left big toe with an ingrown toe nail. See picture below for more details. Looks less swollen today. Skin:no rash  Lymph Nodes - no palpable adenopathy. Neurologic: mentation appears normal, normal speech, strength symmetric. LABSLab Results Component Value Date  WBC 3.4 (L) 03/31/2023  HGB 12.2 (L) 03/31/2023  HCT 37.60 (L) 03/31/2023  MCV 100.3 (H) 03/31/2023  PLT 202 03/31/2023 Comprehensive Metabolic Panel:Lab Results Component Value Date  GLU 92 03/31/2023  BUN 18 03/31/2023  CREATININE 1.08 03/31/2023  NA 141 03/31/2023  K 3.8 03/31/2023  CL 107 03/31/2023  CO2 22 03/31/2023  ALBUMIN 4.0 03/31/2023  PROT 6.2 03/31/2023  BILITOT 0.6 03/31/2023  ALKPHOS 75 03/31/2023  ALT 25 03/31/2023  GLOB 2.2 03/31/2023  CALCIUM 8.8 03/31/2023 No results found for requested labs within last 7 days. 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  His disease is  characterized by minor cytopenias over a long period of observation with recently worsened anemia, thrombocytopenia, and a marrow with increased blasts consistent with disease progression. 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 Therapy was   started with decitabine  and venetoclax , and transplantation  recommended given the now increased risk of shorten survival. The patient has had a formal  search of the registry and a 10/10 match was identified.A/P: Day 182 no evidence of gvhd. Slight drop seen with last labs, likely 2/2 influenza, since recovered. He is feeling better. No new concerns. Eager to move to [name]. Will pass on contact information and fax labs / notes. - stopped tacrolimus  12/25, no evidence of GVHD. New lacy appearanec on buccal mucosa. States it feels like sandpaper. Will trial dex swish x3/day. Call ifsymptoms worsen or do not improve.  1) Disease - Bone marrow biopsy September 10th, this was consistent with remission by morphology. Donor percentage was 100%, FISH and gene panel are normal.  Bone marrow biopsy on 01/05/23 was consistent with remission, no increase in blasts, and donor percentage is 100%, the fish was 100% donor and the gene panel was negative. 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 Goal is tac 5-10, check weekly ldh, haptoglobin, triglycerides. Tacrolimus  stopped 12/243) Heme - Donor, Recipient both blood type A+ve. Blood counts looks good today. 4) ID - Prophylaxis with acyclovir , pentamidine , and Cresemba . He is not at risk for CMV. 5) Hypertension - He is on continued Norvasc . 6) Liver: Continue Ursodiol , monitor mild LFT changes. Normal today. 7) History of skin cancer: Needs to be addressed after 3 month time point. Planned Mohs procedure on December 18th. 8) Abnormal spine, lesion on MRI pre- transplant: Repeated MRI on November 14th. The abnormal 3 cm area in L2 was stable and there was some subtle enhancement of the cauda equina nerve roots is noted on the sagittal postcontrast, of unclear significance enhancement and this was referred to less conspicuous. 9) Floaters: The patient was evaluated by Dr Earna Go, ophthalmologist. Plan to follow him in again in 6-8 weeks. 10) Paronychia in left big toe: Referred to podiatrist for further treatment. He completed Keflex  500 mg 4 times daily for 7 days with some improvement, follow up with Podiatry. RTc as scheduled. We will continue to care for his gvhd and ongoing post transplant needsElectronically Signed by Morgan Arab II, APRN,

## 2023-08-11 ENCOUNTER — Telehealth: Admit: 2023-08-11 | Payer: PRIVATE HEALTH INSURANCE

## 2023-08-11 ENCOUNTER — Encounter: Admit: 2023-08-11 | Payer: PRIVATE HEALTH INSURANCE

## 2023-09-17 DIAGNOSIS — E039 Hypothyroidism, unspecified: Secondary | ICD-10-CM | POA: Diagnosis not present

## 2023-09-17 DIAGNOSIS — I10 Essential (primary) hypertension: Secondary | ICD-10-CM | POA: Diagnosis not present

## 2023-09-17 DIAGNOSIS — Z0000 Encounter for general adult medical examination without abnormal findings: Secondary | ICD-10-CM | POA: Diagnosis not present

## 2023-09-17 DIAGNOSIS — E7800 Pure hypercholesterolemia, unspecified: Secondary | ICD-10-CM | POA: Diagnosis not present

## 2023-09-17 DIAGNOSIS — Z79899 Other long term (current) drug therapy: Secondary | ICD-10-CM | POA: Diagnosis not present

## 2023-09-17 DIAGNOSIS — Z125 Encounter for screening for malignant neoplasm of prostate: Secondary | ICD-10-CM | POA: Diagnosis not present

## 2023-09-17 DIAGNOSIS — Z1331 Encounter for screening for depression: Secondary | ICD-10-CM | POA: Diagnosis not present

## 2023-09-17 DIAGNOSIS — K219 Gastro-esophageal reflux disease without esophagitis: Secondary | ICD-10-CM | POA: Diagnosis not present

## 2023-09-20 ENCOUNTER — Encounter: Admit: 2023-09-20 | Payer: PRIVATE HEALTH INSURANCE

## 2023-09-27 ENCOUNTER — Encounter: Admit: 2023-09-27 | Payer: PRIVATE HEALTH INSURANCE

## 2023-09-27 ENCOUNTER — Inpatient Hospital Stay: Admit: 2023-09-27 | Discharge: 2023-09-27 | Payer: PRIVATE HEALTH INSURANCE

## 2023-09-27 ENCOUNTER — Ambulatory Visit: Admit: 2023-09-27 | Payer: PRIVATE HEALTH INSURANCE | Attending: Family

## 2023-09-27 ENCOUNTER — Encounter: Admit: 2023-09-27 | Payer: PRIVATE HEALTH INSURANCE | Attending: Family

## 2023-09-27 VITALS — BP 143/75 | HR 70 | Temp 97.90000°F | Resp 18 | Wt 236.8 lb

## 2023-09-27 DIAGNOSIS — C9110 Chronic lymphocytic leukemia of B-cell type not having achieved remission: Secondary | ICD-10-CM

## 2023-09-27 DIAGNOSIS — N400 Enlarged prostate without lower urinary tract symptoms: Secondary | ICD-10-CM

## 2023-09-27 DIAGNOSIS — R0609 Other forms of dyspnea: Secondary | ICD-10-CM

## 2023-09-27 DIAGNOSIS — D7589 Other specified diseases of blood and blood-forming organs: Secondary | ICD-10-CM

## 2023-09-27 DIAGNOSIS — Z01818 Encounter for other preprocedural examination: Secondary | ICD-10-CM

## 2023-09-27 DIAGNOSIS — Z9484 Stem cells transplant status: Principal | ICD-10-CM

## 2023-09-27 DIAGNOSIS — C931 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: Secondary | ICD-10-CM

## 2023-09-27 DIAGNOSIS — E782 Mixed hyperlipidemia: Secondary | ICD-10-CM

## 2023-09-27 DIAGNOSIS — F419 Anxiety disorder, unspecified: Secondary | ICD-10-CM

## 2023-09-27 DIAGNOSIS — Z8249 Family history of ischemic heart disease and other diseases of the circulatory system: Secondary | ICD-10-CM

## 2023-09-27 DIAGNOSIS — F32A Depression: Secondary | ICD-10-CM

## 2023-09-27 DIAGNOSIS — D89813 Graft-versus-host disease, unspecified: Secondary | ICD-10-CM

## 2023-09-27 DIAGNOSIS — I119 Hypertensive heart disease without heart failure: Principal | ICD-10-CM

## 2023-09-27 DIAGNOSIS — C4491 Basal cell carcinoma of skin, unspecified: Secondary | ICD-10-CM

## 2023-09-27 DIAGNOSIS — H409 Unspecified glaucoma: Secondary | ICD-10-CM

## 2023-09-27 DIAGNOSIS — Z0000 Encounter for general adult medical examination without abnormal findings: Principal | ICD-10-CM

## 2023-09-27 DIAGNOSIS — I10 Essential (primary) hypertension: Secondary | ICD-10-CM

## 2023-09-27 LAB — BONE MARROW SMEAR, ASPIRATION, AND STAIN      (L YH)

## 2023-09-27 LAB — PERIPHERAL SMEAR FOR PATHOLOGY     (YH)

## 2023-09-27 NOTE — Progress Notes
 Bone Marrow Aspiration/Biopsy Procedure NoteINFORMED CONSENT & TIME OUTThe patient was identified by the following methods (need 2):Name/DOBVerbal with patient and/or familyPotential risks including bleeding, infection and pain were reviewed with the patient.The following procedure verification steps were taken: Procedure confirmed with patient or family/designeeConsent for procedure signedRelevant documentation completed, reviewed, and signedClinical indications for procedure confirmed Site Marked: Not applicableTime-out was taken with all members of procedure team immediately prior to procedure and the following were confirmed: Correct patient identifiedAgreement on procedureCorrect side and siteCorrect patient positionAvailability of correct implant/equipmentPROCEDURE PERFORMED:  Bone Marrow Aspirate & BiopsySITE PERFORMED:  right posterior iliac crest, prepped with Betadine using sterile technique.LOCAL ANESTHESIA: 1% plain lidocaine  subcutaneous injection, 10 ml administered.ORDERING PHYSICIAN:     Seropian                       INDICATIONS:  post bone marrow transplant evaluationPATIENT POSITION: Patient was placed in the prone position.ESTIMATED BLOOD LOSS:  Minimal blood loss.  Hemostasis was maintained by direct pressure to the external puncture site.  A sterile, dry pressure bandage was applied.POST PROCEDURE PAIN LEVEL:  mild discomfortPATIENT CONDITION POST PROCEDURE: stableCOMPLICATIONS: Patient tolerated the procedure well and no complications were noted.SPECIMENS: surgical path, flow, fish, cytogenetics, ccsPATIENT INSTRUCTIONS: Resume normal activity unless significant discomfort or otherwise instructed.Maintain pressure dressing dry and intact for 24 hours.Dressing may be removed after 24 hours and replace with a Band-Aid if needed.May shower after 24 hours.For the first 48 hours the site should not be submerged in water .Ice packs to site(s) as needed for discomfort.Acetaminophen  1-2 tabs every 6 hours as needed for discomfort.Greig KATHEE Koyanagi, [id number] or [facility] [place]

## 2023-09-27 NOTE — Progress Notes
 Pt seen in [facility] today for his BMBX. Arrived A&Ox4 ambulatory, VSS.Today's plan of care explained and reviewed w/ pt.Labs drawn peripherally per plan.Consent obtained by Amy P. APRN.Time out procedure completed prior to procedure.BMBx performed by Amy P. APRNPt monitored for 30 minutes post procedure. VSS BMBx site dry and intact. Pt given verbal instructions re: care of biopsy site, pt verbalized understanding.Pt discharged ambulatory in stable condition accompanied by wife. Copy of AVS provided w/ future appts.RTC: 8/28

## 2023-09-28 LAB — MDS PLUS PANEL
BKR CD10+CD33+: 0.1 %
BKR CD10+CD34+: 7.2 %
BKR CD11B+CD16+: 13.7 %
BKR CD13+CD11B+: 75.7 %
BKR CD14+CD33+: 67.7 %
BKR CD14-CD64+: 12.2 %
BKR CD19+: 39.5 %
BKR CD19+CD38+: 23.9 %
BKR CD19+KAPPA+ MONO GATE: 0 %
BKR CD19+KAPPA+ MYELOID GATE: 0 %
BKR CD19+KAPPA+: 24.7 %
BKR CD19+LAMBDA+ MONO GATE: 0 %
BKR CD19+LAMBDA+ MYELOID GATE: 0.1 %
BKR CD19+LAMBDA+: 17.4 %
BKR CD3+CD16/56+: 1.4 %
BKR CD3+CD16/56-: 11.2 %
BKR CD3+CD4+ MONO GATE: 1.7 %
BKR CD3+CD4+: 7 %
BKR CD3+CD4+CD8+: 0.1 %
BKR CD3+CD5+ MONO GATE: 1.3 %
BKR CD3+CD5+ MYELOID GATE: 0.1 %
BKR CD3+CD5+: 12 %
BKR CD3+CD8+ MONO GATE: 0.1 %
BKR CD3+CD8+: 4.6 %
BKR CD3-CD16/56+: 17.1 %
BKR CD3-CD4+: 50.8 %
BKR CD33+ MONO GATE: 84.7 %
BKR CD33+: 5.8 %
BKR CD33+CD10+: 0 %
BKR CD33+CD34+: 2.1 %
BKR CD33+HLADR-: 2.4 %
BKR CD34+: 10.5 %
BKR CD34+CD10+: 1 %
BKR CD34+CD117+ MONO GATE: 1.8 %
BKR CD34+CD117+: 3.2 %
BKR CD34+CD33+ MONO GATE: 1.2 %
BKR CD34+CD64+ MONO GATE: 1.1 %
BKR CD34+CD64+: 0.4 %
BKR CD34+HLADR+: 8.3 %
BKR CD4/CD8 RATIO: 1.52
BKR CD45+CD10+CD33-: 27.6 %
BKR CD45+CD10-CD33+: 1.1 %
BKR CD45+CD11B+CD13+: 71.1 %
BKR CD45+CD14+CD64+: 0.4 %
BKR CD45+CD16+CD11B+: 40.7 %
BKR CD45+CD16.56+CD3-: 38.1 %
BKR CD45+CD33+HLADR+: 0.9 %
BKR CD45+CD34+CD10+: 0.5 %
BKR CD45+CD34+CD117+: 0.7 %
BKR CD45+CD34+CD33+: 0.1 %
BKR CD45+CD34+CD7+: 0.2 %
BKR CD45+CD56+CD38-: 0.2 %
BKR CD56+: 0 %
BKR CD56+CD38+: 2.1 %
BKR CD64+: 80 %
BKR CD64+CD14+ MONO GATE: 68.9 %
BKR CD64+CD14+: 1.6 %
BKR CD7+CD117+: 0.7 %
BKR CD7+CD117- MONO GATE: 1.1 %
BKR CD7+CD117-: 24.4 %
BKR HLADR+CD33+: 81.5 %

## 2023-09-28 LAB — FLOW CYTOMETRY - LEUKEMIA/LYMPHOMA/NEOPLASIA, BONE MARROW

## 2023-10-01 LAB — ENGRAFTMENT/CHIMERISM POST, BONE MARROW     (YH): BKR % DONOR CELLS: 100 %

## 2023-10-06 ENCOUNTER — Ambulatory Visit: Admit: 2023-10-06 | Payer: PRIVATE HEALTH INSURANCE

## 2023-10-10 ENCOUNTER — Encounter: Admit: 2023-10-10 | Payer: PRIVATE HEALTH INSURANCE | Attending: Adult Health

## 2023-10-11 LAB — CCS, HEME     (YH)

## 2023-10-14 ENCOUNTER — Encounter: Admit: 2023-10-14 | Payer: PRIVATE HEALTH INSURANCE | Attending: Medical Oncology

## 2023-10-14 ENCOUNTER — Inpatient Hospital Stay: Admit: 2023-10-14 | Discharge: 2023-10-14 | Payer: PRIVATE HEALTH INSURANCE

## 2023-10-14 ENCOUNTER — Encounter: Admit: 2023-10-14 | Payer: PRIVATE HEALTH INSURANCE

## 2023-10-14 ENCOUNTER — Ambulatory Visit: Admit: 2023-10-14 | Payer: PRIVATE HEALTH INSURANCE | Attending: Medical Oncology

## 2023-10-14 VITALS — BP 157/69 | HR 71 | Temp 97.10000°F | Resp 20 | Ht 74.0 in | Wt 238.5 lb

## 2023-10-14 DIAGNOSIS — R0609 Other forms of dyspnea: Secondary | ICD-10-CM

## 2023-10-14 DIAGNOSIS — Z23 Encounter for immunization: Secondary | ICD-10-CM

## 2023-10-14 DIAGNOSIS — E782 Mixed hyperlipidemia: Secondary | ICD-10-CM

## 2023-10-14 DIAGNOSIS — Z9484 Stem cells transplant status: Principal | ICD-10-CM

## 2023-10-14 DIAGNOSIS — F419 Anxiety disorder, unspecified: Secondary | ICD-10-CM

## 2023-10-14 DIAGNOSIS — I119 Hypertensive heart disease without heart failure: Principal | ICD-10-CM

## 2023-10-14 DIAGNOSIS — Z9481 Bone marrow transplant status: Secondary | ICD-10-CM

## 2023-10-14 DIAGNOSIS — N400 Enlarged prostate without lower urinary tract symptoms: Secondary | ICD-10-CM

## 2023-10-14 DIAGNOSIS — Z8249 Family history of ischemic heart disease and other diseases of the circulatory system: Secondary | ICD-10-CM

## 2023-10-14 DIAGNOSIS — I10 Essential (primary) hypertension: Secondary | ICD-10-CM

## 2023-10-14 DIAGNOSIS — F32A Depression: Secondary | ICD-10-CM

## 2023-10-14 DIAGNOSIS — C4491 Basal cell carcinoma of skin, unspecified: Secondary | ICD-10-CM

## 2023-10-14 DIAGNOSIS — H409 Unspecified glaucoma: Secondary | ICD-10-CM

## 2023-10-14 DIAGNOSIS — C931 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: Secondary | ICD-10-CM

## 2023-10-14 DIAGNOSIS — C9110 Chronic lymphocytic leukemia of B-cell type not having achieved remission: Secondary | ICD-10-CM

## 2023-10-14 LAB — CBC WITH AUTO DIFFERENTIAL
BKR WAM ABSOLUTE IMMATURE GRANULOCYTES.: 0.02 x 1000/ÂµL (ref 0.00–0.30)
BKR WAM ABSOLUTE LYMPHOCYTE COUNT.: 1.87 x 1000/ÂµL (ref 0.60–3.70)
BKR WAM ABSOLUTE NRBC: 0 x 1000/ÂµL (ref 0.00–1.00)
BKR WAM ANC (ABSOLUTE NEUTROPHIL COUNT): 2.82 x 1000/ÂµL (ref 2.00–7.60)
BKR WAM BASOPHIL ABSOLUTE COUNT.: 0.05 x 1000/ÂµL (ref 0.00–1.00)
BKR WAM BASOPHILS: 0.9 % (ref 0.0–1.4)
BKR WAM EOSINOPHIL ABSOLUTE COUNT.: 0.07 x 1000/ÂµL (ref 0.00–1.00)
BKR WAM EOSINOPHILS: 1.3 % (ref 0.0–5.0)
BKR WAM HEMATOCRIT: 41.8 % (ref 38.50–50.00)
BKR WAM HEMOGLOBIN: 13.7 g/dL (ref 13.2–17.1)
BKR WAM IMMATURE GRANULOCYTES: 0.4 % (ref 0.0–1.0)
BKR WAM LYMPHOCYTES: 33.5 % (ref 17.0–50.0)
BKR WAM MCH: 32.5 pg (ref 27.0–33.0)
BKR WAM MCHC: 32.8 g/dL (ref 31.0–36.0)
BKR WAM MCV: 99.3 fL (ref 80.0–100.0)
BKR WAM MONOCYTE ABSOLUTE COUNT.: 0.75 x 1000/ÂµL (ref 0.00–1.00)
BKR WAM MONOCYTES: 13.4 % — ABNORMAL HIGH (ref 4.0–12.0)
BKR WAM MPV: 9.9 fL (ref 8.0–12.0)
BKR WAM NEUTROPHILS: 50.5 % (ref 39.0–72.0)
BKR WAM NUCLEATED RED BLOOD CELLS: 0 % (ref 0.0–1.0)
BKR WAM PLATELETS: 180 x1000/ÂµL (ref 150–420)
BKR WAM RDW-CV: 14 % (ref 11.0–15.0)
BKR WAM RED BLOOD CELL COUNT.: 4.21 M/ÂµL (ref 4.00–6.00)
BKR WAM WHITE BLOOD CELL COUNT: 5.6 x1000/ÂµL (ref 4.0–11.0)

## 2023-10-14 LAB — COMPREHENSIVE METABOLIC PANEL
BKR A/G RATIO: 1.8 (ref 1.0–2.2)
BKR ALANINE AMINOTRANSFERASE (ALT): 28 U/L (ref 9–59)
BKR ALBUMIN: 4.3 g/dL (ref 3.6–5.1)
BKR ALKALINE PHOSPHATASE: 90 U/L (ref 9–122)
BKR ANION GAP: 13 (ref 7–17)
BKR ASPARTATE AMINOTRANSFERASE (AST): 35 U/L (ref 10–35)
BKR AST/ALT RATIO: 1.3
BKR BILIRUBIN TOTAL: 0.5 mg/dL (ref <=1.2)
BKR BLOOD UREA NITROGEN: 18 mg/dL (ref 8–23)
BKR BUN / CREAT RATIO: 15 (ref 8.0–23.0)
BKR CALCIUM: 9.2 mg/dL (ref 8.8–10.2)
BKR CHLORIDE: 106 mmol/L (ref 98–107)
BKR CO2: 22 mmol/L (ref 20–30)
BKR CREATININE DELTA: 0.12
BKR CREATININE: 1.2 mg/dL (ref 0.40–1.30)
BKR EGFR, CREATININE (CKD-EPI 2021): >60 mL/min/1.73m2 (ref >=60)
BKR GLOBULIN: 2.4 g/dL (ref 2.0–3.9)
BKR GLUCOSE: 98 mg/dL (ref 70–100)
BKR POTASSIUM: 4.9 mmol/L (ref 3.3–5.3)
BKR PROTEIN TOTAL: 6.7 g/dL (ref 5.9–8.3)
BKR SODIUM: 141 mmol/L (ref 136–144)

## 2023-10-14 LAB — IMMUNOGLOBULINS IGG, IGA, IGM
BKR IGA: 87 mg/dL (ref 70–470)
BKR IGG: 907 mg/dL (ref 700–1600)
BKR IGM: 58 mg/dL (ref 40–230)

## 2023-10-14 LAB — LACTATE DEHYDROGENASE: BKR LACTATE DEHYDROGENASE: 222 U/L (ref 122–241)

## 2023-10-14 MED ORDER — POLIOVIRUS VACCINE 40 UNIT-8 UNIT-32 UNIT/0.5 ML INJECTION SUSPENSION
40-8-32 | Freq: Once | INTRAMUSCULAR | Status: CP
Start: 2023-10-14 — End: ?
  Administered 2023-10-14: 14:00:00 40-8-32 mL via INTRAMUSCULAR

## 2023-10-14 MED ORDER — FINASTERIDE 5 MG TABLET
5 | ORAL_TABLET | Freq: Every day | ORAL | 3 refills | 30.00000 days | Status: AC
Start: 2023-10-14 — End: ?

## 2023-10-14 MED ORDER — DIPHTH,PERTUSSIS(ACEL),TETANUS 2.5 LF UNIT-8 MCG-5 LF/0.5ML IM SYRINGE
2.5-8-5 | Freq: Once | INTRAMUSCULAR | Status: CP
Start: 2023-10-14 — End: ?
  Administered 2023-10-14: 14:00:00 2.5-8-5 mL via INTRAMUSCULAR

## 2023-10-14 MED ORDER — PNEUMOCOCCAL 21-VALENT CONJ VACCINE-DIP CRM (PF) 0.5 ML IM SYRINGE
0.5 | Freq: Once | INTRAMUSCULAR | Status: CP
Start: 2023-10-14 — End: ?
  Administered 2023-10-14: 14:00:00 0.5 mL via INTRAMUSCULAR

## 2023-10-14 MED ORDER — HEPATITIS B VIRUS VACCINE RECOMB (PF) 20 MCG/ML INTRAMUSCULAR SYRINGE
20 | Freq: Once | INTRAMUSCULAR | Status: CP
Start: 2023-10-14 — End: ?
  Administered 2023-10-14: 14:00:00 20 mL via INTRAMUSCULAR

## 2023-10-14 MED ORDER — SULFAMETHOXAZOLE 800 MG-TRIMETHOPRIM 160 MG TABLET
800-160 | ORAL | 1.00 refills | 30.00000 days | Status: AC
Start: 2023-10-14 — End: ?

## 2023-10-14 MED ORDER — HAEMOPHILUS B CONJUGATE VACCINE (HIBERIX)
Freq: Once | INTRAMUSCULAR | Status: CP
Start: 2023-10-14 — End: ?
  Administered 2023-10-14: 14:00:00 via INTRAMUSCULAR

## 2023-10-14 NOTE — Progress Notes
 12 MONTH POST TRANSPLANT VACCINES GIVEN AS ORDERED, DIVIDED IN EACH ARM, TOL WELL. VACCINE SCHEDULE AND VIS'S GIVEN FOR REVIEW. RTC IN 2 MONTHS FOR 14 MONTH VACCINES.

## 2023-10-15 LAB — LYMPHOCYTE SUBSETS, TOTAL TBNK (BH GH LMW YH)
BK CD3-CD16/56+ ABS LYMPH COUNT: 1011 /uL — ABNORMAL HIGH (ref 127–785)
BKR CD19+ ABS LYMPH COUNT: 499 /uL — ABNORMAL HIGH (ref 40–474)
BKR CD19+ LYMPH: 25.12 % (ref 3.17–26.01)
BKR CD3+ ABS LYMPH COUNT: 433 /uL — ABNORMAL LOW (ref 599–2401)
BKR CD3+ LYMPH: 21.78 % — ABNORMAL LOW (ref 52.81–84.29)
BKR CD3+/CD4+ LYMPH (EXCL.DUAL POS.): 15.89 % — ABNORMAL LOW (ref 26.95–64.80)
BKR CD3+/CD8+LYMPH (EXCL.DUAL POS.): 5.69 % — ABNORMAL LOW (ref 7.52–43.41)
BKR CD3+CD4+ ABS LYMPH COUNT (EXCL.DUAL POS.): 316 /uL — ABNORMAL LOW (ref 363–1592)
BKR CD3+CD4+CD8+  LYMPH: 0.07 % — ABNORMAL LOW (ref 0.23–5.31)
BKR CD3+CD4+CD8+ ABSOLUTE LYMPH COUNT: 1 /uL — ABNORMAL LOW (ref 3–126)
BKR CD3+CD4-CD8- ABSOLUTE LYMPH COUNT: 2 /uL — ABNORMAL LOW (ref 8–113)
BKR CD3+CD4-CD8- LYMPH: 0.12 % — ABNORMAL LOW (ref 0.42–5.95)
BKR CD3+CD8+ ABS LYMPH COUNT (EXCL.DUAL POS.): 113 /uL (ref 99–1051)
BKR CD3-CD16/56+ LYMPH: 50.85 % — ABNORMAL HIGH (ref 5.56–33.68)
BKR CD4/CD8 LYMPH RATIO (EXCL.DUAL POS.): 2.79 (ref 0.62–7.79)
BKR CD45+ ABS LYMPH COUNT: 1988 /uL (ref 967–3023)
BKR TOTAL LYMPH EVENTS: 4008

## 2023-10-18 LAB — CHROMOSOME ANALYSIS (CYTOGENETICS) (YMG): BKR BANDING RESOLUTION: 400

## 2023-10-21 ENCOUNTER — Ambulatory Visit: Admit: 2023-10-21 | Payer: PRIVATE HEALTH INSURANCE | Attending: Medical Oncology

## 2023-10-21 ENCOUNTER — Ambulatory Visit: Admit: 2023-10-21 | Payer: PRIVATE HEALTH INSURANCE

## 2023-11-02 ENCOUNTER — Encounter: Admit: 2023-11-02 | Payer: PRIVATE HEALTH INSURANCE

## 2023-11-05 ENCOUNTER — Encounter: Admit: 2023-11-05 | Payer: PRIVATE HEALTH INSURANCE

## 2023-11-09 ENCOUNTER — Encounter: Admit: 2023-11-09 | Payer: PRIVATE HEALTH INSURANCE

## 2023-11-10 ENCOUNTER — Encounter: Admit: 2023-11-10 | Payer: PRIVATE HEALTH INSURANCE

## 2023-11-10 NOTE — Progress Notes
 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 Tor INTERIM HISTORY: [name] is 1 year post transplant. He had a bone marrow biopsy on 08/11 which showed maturing trilienage hematopoiesis, donor cells 100%, gene panel did not show any mutations. Molecular studies are pending. Overall, he is doing well. He is following with Dr. Julieann in [place] . He is not using mouth rinse for a couple of months. He denies dry mouth. He has been eating and drinking well. He denies cold, fever, or interim infection.  We reviewed the current medication list and made the necessary changes to chart. HPI:   [name] has a hx of Hypertension, hyperlipidemia, basal cell cancer of the skin, iron deficiency anemia.  The patient was evaluated for cytopenias in 2021 in [place] .  Initial evaluation from [facility], source records are not available today.  Per the patient's subsequent records, he initially presented with leukopenia, thrombocytopenia, and monocytosis.  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  Initial bone marrow was performed on August 24, 2019, was reviewed at [facility] with findings of multilineage dysplasia.  The marrow was 40% cellular.  Megakaryocytes showed frequent dysplasia with  hyperchromatic forms and clustering.  Blasts were not increased.  A FISH panel for MDS  loci was negative.  Karyotype was normal. 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  The patient was observed and was without any treatment or transfusions.  In 2023, he had an acute drop in hemoglobin, was found to be iron deficient.  Workup for this did not show a clear focus of blood loss or bleeding.  The patient has continued evaluation and established care with Dr. Tor in June 2023 as he spends some summers in [place] . Of note, there is a label of CLL in the patient's chart, which I suspect was a typo, and find no evidence that support that diagnosis.  Dr. Sigmund assessment in June 2023 per the CPSS molecular score being considered intermediate one with a low risk of development of leukemia and median survival over 5 years.  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  The patient had a marrow performed at [facility] on 29th with CD34 stain showing 10% to 15% blasts focally more than 20%.  Marrow was hypercellular at 70%.  Dysplastic megakaryocytes again appreciated.  Molecular studies are pending.  Given the findings, treatment with the decitabine  and venetoclax  was recommended.  A referral has been placed for urgent EGD colonoscopy given the low iron concern for occult source of bleeding.He started treatment last week which he is tolerated well. He received 5-6 red blood cell transfusion in the past several month. He was suffering weakness and dyspnea which is now improved after transfusion. He does not have a significant infectious history, he did have COVID-19 in the past from which he recovered. He has hypertension and hyperlipidemia managed by cardiologist in [place] . He reports a negative stress test in the past. He has had number of skin cancers and has had several MOHS procedures. He is followed with a urologist for some BPH and abnormal PSA. He has had 2 prostate biopsies which was negative. He  carries a diagnosis of iron deficiency and require transfusion in [place]  after a significant and acute drop in hemoglobin. There was no clinical evidence of bleeding. Endoscopy and colonoscopy are planned to exclude a source of bleeding. Social history: Mar Douglas [name] is a 73 yo gentlemen that lives with is wife in [place].  He is currently spending the summer in [place] visiting family.  He has 2 daughters and 5 grandchildren.  Retired, having worked for [employer].  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  He is a social drinker of 1-2 beers per week, denies illicit drug use and is a non smoker.  Covid-19 vaccine series completed. 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 (HC Code)    CLL (chronic lymphocytic leukemia) (HC Code)    Depression   DOE (dyspnea on exertion)   Enlarged prostate   Essential hypertension   Family history of ischemic heart disease   Glaucoma   Hypertensive heart disease without heart failure   Mixed hyperlipidemia  Surgical HistoryPast Surgical History: Procedure Laterality Date  ADENOIDECTOMY    BONE MARROW BIOPSY    PROSTATE BIOPSY    TONSILLECTOMY    VASCULAR SURGERY   AllergiesAllergies Allergen Reactions  Voriconazole  Other (See Comments)   Gingival edema/redness making it difficult to bite down  Current Outpatient Medications:   acyclovir  (ZOVIRAX ) 400 mg tablet, Take 1 tablet (400 mg total) by mouth every 12 (twelve) hours., Disp: 180 tablet, Rfl: 3  amLODIPine  (NORVASC ) 10 mg tablet, Take 1 tablet (10 mg total) by mouth daily., Disp: , Rfl:   atorvastatin  (LIPITOR) 10 mg tablet, TAKE 1 TABLET BY MOUTH EVERY DAY, Disp: 90 tablet, Rfl: 1  finasteride  (PROSCAR ) 5 mg tablet, Take 1 tablet (5 mg total) by mouth daily., Disp: 30 tablet, Rfl: 2  folic acid  (FOLVITE ) 1 mg tablet, Take 1 tablet (1 mg total) by mouth daily., Disp: 30 tablet, Rfl: 11  latanoprost  (XALATAN ) 0.005 % ophthalmic solution, Place 1 drop into both eyes nightly., Disp: , Rfl:   levoFLOXacin  (LEVAQUIN ) 750 mg tablet, Take 1 tablet (750 mg total) by mouth daily. POST VIRAL PNA PROPHYLAXIS, Disp: 10 tablet, Rfl: 0  lisinopriL  (PRINIVIL ,ZESTRIL ) 10 mg tablet, Take 1 tablet (10 mg total) by mouth daily., Disp: , Rfl:   Miscellaneous Medical Supply, Please provide rolling walker, Use as directed., Disp: 1 each, Rfl: 0  ondansetron  (ZOFRAN ) 8 mg tablet, Take 1 tablet (8 mg total) by mouth every 8 (eight) hours as needed for nausea or vomiting. (Patient not taking: Reported on 03/31/2023), Disp: 30 tablet, Rfl: 2  sulfamethoxazole -trimethoprim  (BACTRIM  DS;CO-TRIMOXAZOLE DS) 800-160 mg per tablet, TAKE 1 TABLET BY MOUTH 3 TIMES A WEEK. TAKE ONE TABLET BY MOUTH MONDAY, WEDNESDAY, FRIDAY., Disp: , Rfl:   tolnaftate  (TINACTIN) 1 % powder, Apply topically 2 (two) times daily., Disp: 45 g, Rfl: 5  triamcinolone  (KENALOG ) 0.1 % cream, Apply topically 2 (two) times daily., Disp: 453 g, Rfl: 0No current facility-administered medications for this visit.Facility-Administered Medications Ordered in Other Visits:   haemophilus b conjugate (Hiberix ) vaccine 0.5 mL, 0.5 mL, Intramuscular, Once, Seropian, Stuart, MD  hepatitis B virus vaccine (ENGERIX B) adult syringe 1 mL, 1 mL, Intramuscular, Once, Seropian, Stuart, MD  pneumococcal vaccine 21 valent (CAPVAXIVE) syringe 0.5 mL, 0.5 mL, Intramuscular, Once, Perla Rigg, MD  poliovirus  vaccine (IPOL) 40-8-32 unit/0.5 mL injection 0.5 mL, 0.5 mL, Intramuscular, Once, Perla Rigg, MD  Tdap (Tetanus-diphtheria-acellular pertussis toxoids) syringe 0.5 mL, 0.5 mL, Intramuscular, Once, Perla Rigg, MDFamily History Problem Relation Age of Onset  Breast cancer Mother   Liver cancer Mother   Glaucoma Father   Heart disease Father   Prostate cancer Father   Breast cancer Sister   Thyroid disease Sister   Hearing loss Sister   Diabetes Daughter   No Known Problems Daughter   No Known Problems Grandchild  EXAM:Temp:  [97.1 ?F (36.2 ?C)] 97.1 ?F (36.2 ?C)Pulse:  [71] 71Resp:  [20] 20BP: (157)/(69) 157/69SpO2:  [99 %] 99 %General Appearance:He is heavy set, appears wellHEENT: there is very slight white lacy appearing to the left buccal surface and a small area anteriorly that is more obvious. Mucous membranes moist, No oral lesions, no erythema or erosions. Lungs: ClearCor: RRR, no m/r/gAbd: soft nontender, no hepatosplenomegaly, Extr: trace edema.  Had a paronychia about the lateral aspect of left big toe with an ingrown toe nail. See picture below for more details. Looks less swollen today. Skin:no rash  Lymph Nodes - no palpable adenopathy. Neurologic: mentation appears normal, normal speech, strength symmetric. Flexibility is normal. LABSLab Results Component Value Date  WBC 5.6 10/14/2023  HGB 13.7 10/14/2023  HCT 41.80 10/14/2023  MCV 99.3 10/14/2023  PLT 180 10/14/2023 Comprehensive Metabolic Panel:Lab Results Component Value Date  GLU 92 03/31/2023  BUN 18 03/31/2023  CREATININE 1.08 03/31/2023  NA 141 03/31/2023  K 3.8 03/31/2023  CL 107 03/31/2023  CO2 22 03/31/2023  ALBUMIN 4.0 03/31/2023  PROT 6.2 03/31/2023  BILITOT 0.6 03/31/2023  ALKPHOS 75 03/31/2023  ALT 25 03/31/2023  GLOB 2.2 03/31/2023  CALCIUM 8.8 03/31/2023 No results found for requested labs within last 7 days. 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  His disease is characterized by minor cytopenias over a long period of observation with recently worsened anemia, thrombocytopenia, and a marrow with increased blasts consistent with disease progression. 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 Therapy was   started with decitabine  and venetoclax , and transplantation  recommended given the now increased risk of shorten survival. The patient has had a formal  search of the registry and a 10/10 match was identified.A/P: 1 year. 1) Disease - Bone marrow biopsy September 10th, this was consistent with remission by morphology. Donor percentage was 100%, FISH and gene panel are normal.  Bone marrow biopsy on 01/05/23 was consistent with remission, no increase in blasts, and donor percentage is 100%, the fish was 100% donor and the gene panel was negative. -Bone marrow biopsy on 09/27/23  showed maturing trilienage hemtopoiesis. Donor cells 100%, gene panel did not show any mutations. Molecular studies are pending. 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 Treated with oral dex, now resolved, asymptomatic without treatment, he will continue to observe. 3) Heme - Donor, Recipient both blood type A+ve. Blood counts looks good today. 4) ID - Prophylaxis with acyclovir , pentamidine , and Cresemba . He is not at risk for CMV. He will start post transplant vaccines today. He will receive boosters in October and March. Vaccine schedule is provided to the patient. Recommended to receive flu vaccine in October Also recommended to receive RSV vaccine. 5) Hypertension - He is on continued Norvasc . 6) Liver:  Normal today. 7) History of skin  cancer: Needs to be addressed after 3 month time point. Planned Mohs procedure on December 18th. 8) Abnormal spine, lesion on MRI pre- transplant: Repeated MRI on November 14th. The abnormal 3 cm area in L2 was stable and there was some subtle enhancement of the cauda equina nerve roots is noted on the sagittal postcontrast, of unclear significance enhancement and this was referred to less conspicuous. 9) Floaters: The patient was evaluated by Dr Hobart, ophthalmologist. Plan to follow him in again in 6-8 weeks. 10) Paronychia in left big toe: Following with podiatrist. Return in about 8 weeks (around 12/09/2023) for MD or APP.On the day of this patient's encounter, a total of 30 minutes was personally spent by me.  This does not include any resident/fellow teaching time, or any time spent performing a procedural service. Dr. Glean Georgi MDScribed for Georgi Glean, MD by Lillia Sor, medical scribe October 14, 2023 The documentation recorded by the scribe accurately reflects the services I personally performed and the decisions made by me. I reviewed and confirmed all material entered and/or pre-charted by the scribe.

## 2023-11-11 ENCOUNTER — Encounter: Admit: 2023-11-11 | Payer: PRIVATE HEALTH INSURANCE | Attending: Medical Oncology

## 2023-11-11 ENCOUNTER — Telehealth: Admit: 2023-11-11 | Payer: PRIVATE HEALTH INSURANCE

## 2023-11-11 NOTE — Telephone Encounter
 I spoke with Douglas [name] early re travel I informed him of the recent surge of viral infections we have been seeing in clinic(Rhino,Influenza V ,Covid ,Par influenza)I informed him he has an increased risk of viral infection on cruise ship with large amounts of people he inquired if he still needs to take Bactrim  he stated some of his labs had not returned at last appt. I reviewed labs with Dr.Seropian CD4  433 can d/c Bactrim  He also can obtain the Shingrix vaccine. I informed him he will need to obtain both doses before he can stop Acyclovir .I left VM message to confirm he can stop Bactrim  and obtain Shingrix

## 2023-11-16 ENCOUNTER — Telehealth: Admit: 2023-11-16 | Payer: PRIVATE HEALTH INSURANCE | Attending: Medical Oncology

## 2023-11-16 NOTE — Telephone Encounter
 Patient's wife requests call back from social work to go over annual billing documentation questions

## 2023-11-22 ENCOUNTER — Encounter: Admit: 2023-11-22 | Payer: PRIVATE HEALTH INSURANCE

## 2023-11-22 DIAGNOSIS — C9200 Acute myeloblastic leukemia, not having achieved remission: Principal | ICD-10-CM

## 2023-11-25 ENCOUNTER — Encounter: Admit: 2023-11-25 | Payer: PRIVATE HEALTH INSURANCE

## 2023-11-26 ENCOUNTER — Encounter: Admit: 2023-11-26 | Payer: PRIVATE HEALTH INSURANCE

## 2023-11-29 ENCOUNTER — Ambulatory Visit: Admit: 2023-11-29 | Payer: MEDICARE

## 2023-11-29 NOTE — Progress Notes
 SOCIAL WORK NOTEPatient Name: Douglas Biggins BennettMedical Record Number: [id number] Date of Birth: 04/16/1952Medical Social Work Follow Up  [employer] Most Recent Value Admission Information  Document Type Progress Note Reason for Current Social Work Involvement Resources Source of Information Patient Record Reviewed Yes Level of Care Ambulatory What medium(s) of communication were used with patient/family/caregiver? Telephone Psychosocial issues requiring intervention [facility] discounted care Psychosocial interventions 10 minutes by phone with [name] who had sent me a text asking about [facility] discounted care. I advised him that I can not text back and I provided him with the phone # for the [facility] financial counselors [phone]. I explained that the [facility] financial counselors will help with the application process. He thanked me for the call and said he is going back to [place] . I wished [name] a safe trip back home and they thanked me. [facility] hemat[facility] Specific referrals to enhance community supports (include existing and new resources) above Handoff Required? No Next Steps/Plan (including hand-off): Social work intervention complete. Please reconsult social work if needed. Signature: Ronal Splinter, LCSW Contact Information: [phone]

## 2023-12-05 ENCOUNTER — Encounter: Admit: 2023-12-05 | Payer: PRIVATE HEALTH INSURANCE | Attending: Family

## 2023-12-05 DIAGNOSIS — Z9484 Stem cells transplant status: Secondary | ICD-10-CM

## 2023-12-05 DIAGNOSIS — C9200 Acute myeloblastic leukemia, not having achieved remission: Principal | ICD-10-CM

## 2023-12-09 ENCOUNTER — Encounter: Admit: 2023-12-09 | Payer: PRIVATE HEALTH INSURANCE | Attending: Adult Health

## 2023-12-09 ENCOUNTER — Inpatient Hospital Stay: Admit: 2023-12-09 | Discharge: 2023-12-09 | Payer: PRIVATE HEALTH INSURANCE

## 2023-12-09 ENCOUNTER — Ambulatory Visit: Admit: 2023-12-09 | Payer: PRIVATE HEALTH INSURANCE | Attending: Adult Health

## 2023-12-09 VITALS — BP 154/74 | HR 64 | Temp 97.30000°F | Resp 20

## 2023-12-09 DIAGNOSIS — Z9484 Stem cells transplant status: Secondary | ICD-10-CM

## 2023-12-09 DIAGNOSIS — F419 Anxiety disorder, unspecified: Secondary | ICD-10-CM

## 2023-12-09 DIAGNOSIS — Z23 Encounter for immunization: Secondary | ICD-10-CM

## 2023-12-09 DIAGNOSIS — Z9481 Bone marrow transplant status: Principal | ICD-10-CM

## 2023-12-09 DIAGNOSIS — C9110 Chronic lymphocytic leukemia of B-cell type not having achieved remission: Secondary | ICD-10-CM

## 2023-12-09 DIAGNOSIS — N400 Enlarged prostate without lower urinary tract symptoms: Secondary | ICD-10-CM

## 2023-12-09 DIAGNOSIS — H409 Unspecified glaucoma: Secondary | ICD-10-CM

## 2023-12-09 DIAGNOSIS — C4491 Basal cell carcinoma of skin, unspecified: Secondary | ICD-10-CM

## 2023-12-09 DIAGNOSIS — C9201 Acute myeloblastic leukemia, in remission: Secondary | ICD-10-CM

## 2023-12-09 DIAGNOSIS — E782 Mixed hyperlipidemia: Secondary | ICD-10-CM

## 2023-12-09 DIAGNOSIS — R0609 Other forms of dyspnea: Secondary | ICD-10-CM

## 2023-12-09 DIAGNOSIS — C9200 Acute myeloblastic leukemia, not having achieved remission: Principal | ICD-10-CM

## 2023-12-09 DIAGNOSIS — C931 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: Secondary | ICD-10-CM

## 2023-12-09 DIAGNOSIS — Z8249 Family history of ischemic heart disease and other diseases of the circulatory system: Secondary | ICD-10-CM

## 2023-12-09 DIAGNOSIS — I10 Essential (primary) hypertension: Secondary | ICD-10-CM

## 2023-12-09 DIAGNOSIS — F32A Depression: Secondary | ICD-10-CM

## 2023-12-09 DIAGNOSIS — I119 Hypertensive heart disease without heart failure: Principal | ICD-10-CM

## 2023-12-09 LAB — CBC WITH AUTO DIFFERENTIAL
BKR WAM ABSOLUTE IMMATURE GRANULOCYTES.: 0.01 x 1000/ÂµL (ref 0.00–0.30)
BKR WAM ABSOLUTE LYMPHOCYTE COUNT.: 1.35 x 1000/ÂµL (ref 0.60–3.70)
BKR WAM ABSOLUTE NRBC: 0 x 1000/ÂµL (ref 0.00–1.00)
BKR WAM ANC (ABSOLUTE NEUTROPHIL COUNT): 2.72 x 1000/ÂµL (ref 2.00–7.60)
BKR WAM BASOPHIL ABSOLUTE COUNT.: 0.04 x 1000/ÂµL (ref 0.00–1.00)
BKR WAM BASOPHILS: 0.8 % (ref 0.0–1.4)
BKR WAM EOSINOPHIL ABSOLUTE COUNT.: 0.08 x 1000/ÂµL (ref 0.00–1.00)
BKR WAM EOSINOPHILS: 1.6 % (ref 0.0–5.0)
BKR WAM HEMATOCRIT: 43.4 % (ref 38.50–50.00)
BKR WAM HEMOGLOBIN: 14.1 g/dL (ref 13.2–17.1)
BKR WAM IMMATURE GRANULOCYTES: 0.2 % (ref 0.0–1.0)
BKR WAM LYMPHOCYTES: 27.7 % (ref 17.0–50.0)
BKR WAM MCH: 32.4 pg (ref 27.0–33.0)
BKR WAM MCHC: 32.5 g/dL (ref 31.0–36.0)
BKR WAM MCV: 99.8 fL (ref 80.0–100.0)
BKR WAM MONOCYTE ABSOLUTE COUNT.: 0.67 x 1000/ÂµL (ref 0.00–1.00)
BKR WAM MONOCYTES: 13.8 % — ABNORMAL HIGH (ref 4.0–12.0)
BKR WAM MPV: 10.4 fL (ref 8.0–12.0)
BKR WAM NEUTROPHILS: 55.9 % (ref 39.0–72.0)
BKR WAM NUCLEATED RED BLOOD CELLS: 0 % (ref 0.0–1.0)
BKR WAM PLATELETS: 199 x1000/ÂµL (ref 150–420)
BKR WAM RDW-CV: 14.2 % (ref 11.0–15.0)
BKR WAM RED BLOOD CELL COUNT.: 4.35 M/ÂµL (ref 4.00–6.00)
BKR WAM WHITE BLOOD CELL COUNT: 4.9 x1000/ÂµL (ref 4.0–11.0)

## 2023-12-09 LAB — VARICELLA ZOSTER ANTIBODY, IGG
BKR VARICELLA-ZOSTER ANTIBODY, IGG: POSITIVE
BKR VZV IGG INITIAL RESULT: 17.2 {s_co_ratio}

## 2023-12-09 LAB — COMPREHENSIVE METABOLIC PANEL
BKR A/G RATIO: 1.5 (ref 1.0–2.2)
BKR ALANINE AMINOTRANSFERASE (ALT): 25 U/L (ref 9–59)
BKR ALBUMIN: 4.3 g/dL (ref 3.6–5.1)
BKR ALKALINE PHOSPHATASE: 86 U/L (ref 9–122)
BKR ANION GAP: 14 (ref 7–17)
BKR ASPARTATE AMINOTRANSFERASE (AST): 27 U/L (ref 10–35)
BKR AST/ALT RATIO: 1.1
BKR BILIRUBIN TOTAL: 0.8 mg/dL (ref <=1.2)
BKR BLOOD UREA NITROGEN: 17 mg/dL (ref 8–23)
BKR BUN / CREAT RATIO: 16.3 (ref 8.0–23.0)
BKR CALCIUM: 9.5 mg/dL (ref 8.8–10.2)
BKR CHLORIDE: 107 mmol/L (ref 98–107)
BKR CO2: 22 mmol/L (ref 20–30)
BKR CREATININE DELTA: -0.16
BKR CREATININE: 1.04 mg/dL (ref 0.40–1.30)
BKR EGFR, CREATININE (CKD-EPI 2021): >60 mL/min/1.73m2 (ref >=60)
BKR GLOBULIN: 2.9 g/dL (ref 2.0–3.9)
BKR GLUCOSE: 82 mg/dL (ref 70–100)
BKR POTASSIUM: 4.3 mmol/L (ref 3.3–5.3)
BKR PROTEIN TOTAL: 7.2 g/dL (ref 5.9–8.3)
BKR SODIUM: 143 mmol/L (ref 136–144)

## 2023-12-09 LAB — IMMUNOGLOBULINS IGG, IGA, IGM
BKR IGA: 101 mg/dL (ref 70–470)
BKR IGG: 974 mg/dL (ref 700–1600)
BKR IGM: 68 mg/dL (ref 40–230)

## 2023-12-09 LAB — MEASLES (RUBEOLA) ANTIBODY, IGG
BKR MEASLES IGG INITIAL RESULT: 165 [AU]/ml
BKR MEASLES IGG: POSITIVE

## 2023-12-09 LAB — RUBELLA ANTIBODY, IGG     (BH GH L LMW YH)
BKR RUBELLA ANTIBODY, IGG: POSITIVE
BKR RUBELLA IGG INITIAL RESULT: 1.18 {index}

## 2023-12-09 LAB — MUMPS ANTIBODY, IGG
BKR MUMPS IGG ANTIBODY: POSITIVE
BKR MUMPS IGG REPEAT 1: 12.2 [AU]/ml

## 2023-12-09 MED ORDER — FLU VACC 2025-26(65YR UP)-MF59C(PF) 45 MCG(15 MCGX3)/0.5 ML IM SYRINGE
45 | Freq: Once | INTRAMUSCULAR | Status: CP
Start: 2023-12-09 — End: ?
  Administered 2023-12-09: 10:00:00 45 mL via INTRAMUSCULAR

## 2023-12-09 MED ORDER — HAEMOPHILUS B CONJUGATE VACCINE (HIBERIX)
Freq: Once | INTRAMUSCULAR | Status: CP
Start: 2023-12-09 — End: ?
  Administered 2023-12-09: 10:00:00 via INTRAMUSCULAR

## 2023-12-09 MED ORDER — PNEUMOCOCCAL 21-VALENT CONJ VACCINE-DIP CRM (PF) 0.5 ML IM SYRINGE
0.5 | Freq: Once | INTRAMUSCULAR | Status: CP
Start: 2023-12-09 — End: ?
  Administered 2023-12-09: 10:00:00 0.5 mL via INTRAMUSCULAR

## 2023-12-09 MED ORDER — DIPHTH,PERTUSSIS(ACEL),TETANUS 2.5 LF UNIT-8 MCG-5 LF/0.5ML IM SYRINGE
2.5-8-5 | Freq: Once | INTRAMUSCULAR | Status: CP
Start: 2023-12-09 — End: ?
  Administered 2023-12-09: 10:00:00 2.5-8-5 mL via INTRAMUSCULAR

## 2023-12-09 MED ORDER — ACYCLOVIR 400 MG TABLET
400 | ORAL_TABLET | Freq: Two times a day (BID) | ORAL | 1 refills | 30.00000 days | Status: AC
Start: 2023-12-09 — End: ?

## 2023-12-09 MED ORDER — HEPATITIS B VIRUS VACCINE RECOMB (PF) 20 MCG/ML INTRAMUSCULAR SYRINGE
20 | Freq: Once | INTRAMUSCULAR | Status: CP
Start: 2023-12-09 — End: ?
  Administered 2023-12-09: 10:00:00 20 mL via INTRAMUSCULAR

## 2023-12-09 MED ORDER — POLIOVIRUS VACCINE 40 UNIT-8 UNIT-32 UNIT/0.5 ML INJECTION SUSPENSION
40-8-32 | Freq: Once | INTRAMUSCULAR | Status: CP
Start: 2023-12-09 — End: ?
  Administered 2023-12-09: 10:00:00 40-8-32 mL via INTRAMUSCULAR

## 2023-12-09 NOTE — Progress Notes
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 Tor INTERIM HISTORY: Douglas [name] is 1 year 84m post transplant. Doing well. Traveling back to [place]  soon. No interim infections. He maintains a routine of walking 6,000 steps a day, though this has decreased due to colder weather. He recently underwent Mohs surgery, which healed well. He is advised to be cautious with sun exposure due to his cancer history.Med list updated and reviewed. HPI:   Douglas [name] has a hx of Hypertension, hyperlipidemia, basal cell cancer of the skin, iron deficiency anemia.  The patient was evaluated for cytopenias in 2021 in [place] .  Initial evaluation from [facility], source records are not available today.  Per the patient's subsequent records, he initially presented with leukopenia, thrombocytopenia, and monocytosis.  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  Initial bone marrow was performed on August 24, 2019, was reviewed at [facility] with findings of multilineage dysplasia.  The marrow was 40% cellular.  Megakaryocytes showed frequent dysplasia with  hyperchromatic forms and clustering.  Blasts were not increased.  A FISH panel for MDS  loci was negative.  Karyotype was normal. 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  The patient was observed and was without any treatment or transfusions.  In 2023, he had an acute drop in hemoglobin, was found to be iron deficient.  Workup for this did not show a clear focus of blood loss or bleeding.  The patient has continued evaluation and established care with Dr. Tor in June 2023 as he spends some summers in [place] .  Of note, there is a label of CLL in the patient's chart, which I suspect was a typo, and find no evidence that support that diagnosis. Dr. Sigmund assessment in June 2023 per the CPSS molecular score being considered intermediate one with a low risk of development of leukemia and median survival over 5 years.  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  The patient had a marrow performed at [facility] on 29th with CD34 stain showing 10% to 15% blasts focally more than 20%.  Marrow was hypercellular at 70%.  Dysplastic megakaryocytes again appreciated.  Molecular studies are pending.  Given the findings, treatment with the decitabine  and venetoclax  was recommended.  A referral has been placed for urgent EGD colonoscopy given the low iron concern for occult source of bleeding.He started treatment last week which he is tolerated well. He received 5-6 red blood cell transfusion in the past several month. He was suffering weakness and dyspnea which is now improved after transfusion. He does not have a significant infectious history, he did have COVID-19 in the past from which he recovered. He has hypertension and hyperlipidemia managed by cardiologist in [place] . He reports a negative stress test in the past. He has had number of skin cancers and has had several MOHS procedures. He is followed with a urologist for some BPH and abnormal PSA. He has had 2 prostate biopsies which was negative. He carries a diagnosis of iron deficiency and require transfusion in [place]  after a significant and acute drop in hemoglobin. There was no clinical evidence of bleeding. Endoscopy  and colonoscopy are planned to exclude a source of bleeding. Social history: Douglas [name] is a 73 yo gentlemen that lives with is wife in [place].  He is currently spending the summer in [place] visiting family.  He has 2 daughters and 5 grandchildren.  Retired, having worked for [employer].  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  He is a social drinker of 1-2 beers per week, denies illicit drug use and is a non smoker.  Covid-19 vaccine series completed. 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 (HC Code)  (HC CODE)   CLL (chronic lymphocytic leukemia) (HC CODE)   Depression   DOE (dyspnea on exertion)   Enlarged prostate   Essential hypertension   Family history of ischemic heart disease   Glaucoma   Hypertensive heart disease without heart failure   Mixed hyperlipidemia  Surgical HistoryPast Surgical History: Procedure Laterality Date  ADENOIDECTOMY    BONE MARROW BIOPSY    PROSTATE BIOPSY    TONSILLECTOMY    VASCULAR SURGERY   AllergiesAllergies Allergen Reactions  Voriconazole  Other (See Comments)   Gingival edema/redness making it difficult to bite down  Current Outpatient Medications:   acyclovir  (ZOVIRAX ) 400 mg tablet, Take 1 tablet (400 mg total) by mouth every 12 (twelve) hours., Disp: 180 tablet, Rfl: 0  amLODIPine  (NORVASC ) 10 mg tablet, Take 1 tablet (10 mg total) by mouth daily., Disp: , Rfl:   atorvastatin  (LIPITOR) 10 mg tablet, TAKE 1 TABLET BY MOUTH EVERY DAY, Disp: 90 tablet, Rfl: 1  finasteride  (PROSCAR ) 5 mg tablet, Take 1 tablet (5 mg total) by mouth daily., Disp: 30 tablet, Rfl: 2  folic acid  (FOLVITE ) 1 mg tablet, Take 1 tablet (1 mg total) by mouth daily., Disp: 30 tablet, Rfl: 11  latanoprost  (XALATAN ) 0.005 % ophthalmic solution, Place 1 drop into both eyes nightly., Disp: , Rfl: lisinopriL  (PRINIVIL ,ZESTRIL ) 10 mg tablet, Take 1 tablet (10 mg total) by mouth daily., Disp: , Rfl: No current facility-administered medications for this visit.Family History Problem Relation Age of Onset  Breast cancer Mother   Liver cancer Mother   Glaucoma Father   Heart disease Father   Prostate cancer Father   Breast cancer Sister   Thyroid disease Sister   Hearing loss Sister   Diabetes Daughter   No Known Problems Daughter   No Known Problems Grandchild  EXAM:Temp:  [97.3 ?F (36.3 ?C)] 97.3 ?F (36.3 ?C)Pulse:  [64] 64Resp:  [20] 20BP: (154)/(74) 154/74SpO2:  [99 %] 99 %General Appearance:He is heavy set, appears wellHEENT: there is very slight white lacy appearing to the left buccal surface and a small area anteriorly that is more obvious. Mucous membranes moist, No oral lesions, no erythema or erosions. Lungs: ClearCor: RRR, no m/r/gAbd: soft nontender, no hepatosplenomegaly, Extr: trace edema.  Had a paronychia about the lateral aspect of left big toe with an ingrown toe nail. See picture below for more details. Looks less swollen today. Skin:no rash  Lymph Nodes - no palpable adenopathy. Neurologic: mentation appears normal, normal speech, strength symmetric. Flexibility is normal. LABSLab Results Component Value Date  WBC 5.6 10/14/2023  HGB 13.7 10/14/2023  HCT 41.80 10/14/2023  MCV 99.3 10/14/2023  PLT 180 10/14/2023 Comprehensive Metabolic  Panel:Lab Results Component Value Date  GLU 82 12/09/2023  BUN 17 12/09/2023  CREATININE 1.04 12/09/2023  NA 143 12/09/2023  K 4.3 12/09/2023  CL 107 12/09/2023  CO2 22 12/09/2023  ALBUMIN 4.3 12/09/2023  PROT 7.2 12/09/2023  BILITOT 0.8 12/09/2023  ALKPHOS 86 12/09/2023  ALT 25 12/09/2023  GLOB 2.9 12/09/2023  CALCIUM 9.5 12/09/2023 No results found for requested labs within last 7 days. 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  His disease is characterized by minor cytopenias over a long period of observation with recently worsened anemia, thrombocytopenia, and a marrow with increased blasts consistent with disease progression. 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 Therapy was   started with decitabine  and venetoclax , and transplantation  recommended given the now increased risk of shorten survival. The patient has had a formal  search of the registry and a 10/10 match was identified.A/P: 1 year 1 mAssessment & PlanChronic myelomonocytic leukemia (CMML)  No evidence of disease. Labs in a few months locally. Basal cell carcinoma  Mohs surgery was successful. A sun-sensitive topical chemotherapy cream is recommended. Consult a dermatologist in [place]  for this treatment.Vaccinations  He is due for the second shingles shot, flu shot, and RSV vaccine due to increased post-transplant risk. Administer the flu shot today and plan the second shingles shot post-Thanksgiving. Advise the RSV vaccine and consider the COVID-19 vaccination if not yet received.Medication management  Current medications include Acyclovir , amlodipine , atorvastatin , finasteride , and lisinopril . Continue Acyclovir  until two weeks after the second shingles shot and refill a 90-day supply at [facility]. Continue amlodipine , atorvastatin , finasteride , and lisinopril . Consider resuming baby aspirin  and magnesium  after consulting with a cardiologist.Follow-up  A follow-up is planned for August next year with vaccinations and a hematologist visit. Schedule this follow-up with Dr. Seropian and plan for the measles, mumps, and rubella vaccine during the visit.1) Disease - Bone marrow biopsy September 10th, this was consistent with remission by morphology. Donor percentage was 100%, FISH and gene panel are normal.  Bone marrow biopsy on 01/05/23 was consistent with remission, no increase in blasts, and donor percentage is 100%, the fish was 100% donor and the gene panel was negative. -Bone marrow biopsy on 09/27/23  showed maturing trilienage hemtopoiesis. Donor cells 100%, gene panel did not show any mutations. Molecular studies are pending. 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 Blood counts looks good today. 4) ID - Prophylaxis with acyclovir , pentamidine , and Cresemba . He is not at risk for CMV. He will start post transplant vaccines today. He will receive boosters in October and March. Vaccine schedule is provided to the patient. Recommended to receive flu vaccine in October Also recommended to receive RSV vaccine. 5) Hypertension - He is on continued Norvasc . 6) Liver:  Normal today. 7) History of skin cancer: Needs to be addressed after 3 month time point. Planned Mohs procedure on December 18th. 8) Abnormal spine, lesion on MRI pre- transplant: Repeated MRI on November 14th. The abnormal 3 cm area in L2 was stable and there was some subtle enhancement of the cauda equina nerve roots is noted on the sagittal postcontrast, of unclear significance enhancement and this was referred to less conspicuous. 9) Floaters: The patient was evaluated by Dr Hobart, ophthalmologist. Plan to follow him in again in 6-8 weeks. 10) Paronychia in left big toe: Following with podiatrist. Return in about 10 months (around 10/03/2024).Electronically Signed by Real Ports II, APRN, December 09, 2023

## 2023-12-09 NOTE — Progress Notes
 Vaccines given as ordered, divided between each arm.

## 2023-12-10 LAB — LYMPHOCYTE SUBSETS, TOTAL TBNK (BH GH LMW YH)
BK CD3-CD16/56+ ABS LYMPH COUNT: 531 /uL (ref 127–785)
BKR CD19+ ABS LYMPH COUNT: 264 /uL (ref 40–474)
BKR CD19+ LYMPH: 24.55 % (ref 3.17–26.01)
BKR CD3+ ABS LYMPH COUNT: 255 /uL — ABNORMAL LOW (ref 599–2401)
BKR CD3+ LYMPH: 24.02 % — ABNORMAL LOW (ref 52.81–84.29)
BKR CD3+/CD4+ LYMPH (EXCL.DUAL POS.): 18.58 % — ABNORMAL LOW (ref 26.95–64.80)
BKR CD3+/CD8+LYMPH (EXCL.DUAL POS.): 3.48 % — ABNORMAL LOW (ref 7.52–43.41)
BKR CD3+CD4+ ABS LYMPH COUNT (EXCL.DUAL POS.): 200 /uL — ABNORMAL LOW (ref 363–1592)
BKR CD3+CD8+ ABS LYMPH COUNT (EXCL.DUAL POS.): 37 /uL — ABNORMAL LOW (ref 99–1051)
BKR CD3-CD16/56+ LYMPH: 49.43 % — ABNORMAL HIGH (ref 5.56–33.68)
BKR CD4/CD8 LYMPH RATIO (EXCL.DUAL POS.): 5.34 (ref 0.62–7.79)
BKR CD45+ ABS LYMPH COUNT: 1075 /uL (ref 967–3023)
BKR TOTAL LYMPH EVENTS: 4830

## 2023-12-11 LAB — TETANUS ANTITOXOID   (BH GH LMW Q YH)
TETANUS IGG AB: POSITIVE
TETANUS IGG VALUE: >2.24 [IU]/mL

## 2023-12-14 ENCOUNTER — Encounter: Admit: 2023-12-14 | Payer: PRIVATE HEALTH INSURANCE

## 2023-12-14 LAB — STREPTOCOCCUS PNEUMONIAE IGG AB (23 SEROTYPES)  (BH GH LMW Q YH)
SEROTYPE 1 (1): 2.5 ug/mL (ref >=1.0)
SEROTYPE 10A (34): 5.8 ug/mL (ref >=1.0)
SEROTYPE 11A (43): 2.4 ug/mL (ref >=1.0)
SEROTYPE 12F (12): 1.3 ug/mL (ref >=1.0)
SEROTYPE 14 (14): 3.8 ug/mL (ref >=1.0)
SEROTYPE 15B (54): 5.7 ug/mL (ref >=1.0)
SEROTYPE 17F (17): 5.7 ug/mL (ref >=1.0)
SEROTYPE 18C (56): 1.8 ug/mL (ref >=1.0)
SEROTYPE 19A (57): 6.4 ug/mL (ref >=1.0)
SEROTYPE 19F (19): 7.5 ug/mL (ref >=1.0)
SEROTYPE 2 (2): 4.9 ug/mL (ref >=1.0)
SEROTYPE 20 (20): 7.8 ug/mL (ref >=1.0)
SEROTYPE 22F (22): 3.1 ug/mL (ref >=1.0)
SEROTYPE 23F (23): 2.2 ug/mL (ref >=1.0)
SEROTYPE 3 (3): 1.6 ug/mL (ref >=1.0)
SEROTYPE 33F (70): 5.5 ug/mL (ref >=1.0)
SEROTYPE 4 (4): 1.9 ug/mL (ref >=1.0)
SEROTYPE 5 (5): 13.1 ug/mL (ref >=1.0)
SEROTYPE 6B (26): 0.9 ug/mL (ref >=1.0)
SEROTYPE 7F (51): 2.2 ug/mL (ref >=1.0)
SEROTYPE 8 (8): 8.5 ug/mL (ref >=1.0)
SEROTYPE 9N (9): 2 ug/mL (ref >=1.0)
SEROTYPE 9V (68): 2.7 ug/mL (ref >=1.0)

## 2023-12-15 ENCOUNTER — Encounter: Admit: 2023-12-15 | Payer: PRIVATE HEALTH INSURANCE

## 2024-01-06 ENCOUNTER — Encounter: Admit: 2024-01-06 | Payer: PRIVATE HEALTH INSURANCE | Attending: Medical Oncology

## 2024-01-06 ENCOUNTER — Encounter: Admit: 2024-01-06 | Payer: PRIVATE HEALTH INSURANCE

## 2024-01-06 MED ORDER — FINASTERIDE 5 MG TABLET
5 | ORAL_TABLET | Freq: Every day | ORAL | 3 refills | 90.00000 days | Status: AC
Start: 2024-01-06 — End: ?

## 2024-02-18 ENCOUNTER — Encounter: Admit: 2024-02-18 | Payer: PRIVATE HEALTH INSURANCE

## 2024-02-23 ENCOUNTER — Encounter: Admit: 2024-02-23 | Payer: PRIVATE HEALTH INSURANCE | Attending: Medical Oncology

## 2024-03-09 ENCOUNTER — Encounter: Admit: 2024-03-09 | Payer: PRIVATE HEALTH INSURANCE

## 2024-03-13 ENCOUNTER — Encounter: Admit: 2024-03-13 | Payer: PRIVATE HEALTH INSURANCE

## 2024-06-08 ENCOUNTER — Ambulatory Visit: Admitting: Dermatology
